# Patient Record
Sex: Male | Born: 1980 | Race: White | Hispanic: No | Smoking: Never smoker
Health system: Southern US, Community
[De-identification: ages and names within clinical notes are randomized; demographics above are authoritative.]

## PROBLEM LIST (undated history)

## (undated) ENCOUNTER — Emergency Department (HOSPITAL_COMMUNITY): Admission: EM | Payer: 59

## (undated) DIAGNOSIS — G894 Chronic pain syndrome: Secondary | ICD-10-CM

## (undated) DIAGNOSIS — F32A Depression, unspecified: Secondary | ICD-10-CM

## (undated) DIAGNOSIS — G4700 Insomnia, unspecified: Secondary | ICD-10-CM

## (undated) DIAGNOSIS — R0600 Dyspnea, unspecified: Secondary | ICD-10-CM

## (undated) DIAGNOSIS — N200 Calculus of kidney: Secondary | ICD-10-CM

## (undated) DIAGNOSIS — Z87442 Personal history of urinary calculi: Secondary | ICD-10-CM

## (undated) DIAGNOSIS — E119 Type 2 diabetes mellitus without complications: Secondary | ICD-10-CM

## (undated) DIAGNOSIS — R569 Unspecified convulsions: Secondary | ICD-10-CM

## (undated) DIAGNOSIS — I10 Essential (primary) hypertension: Secondary | ICD-10-CM

## (undated) DIAGNOSIS — F329 Major depressive disorder, single episode, unspecified: Secondary | ICD-10-CM

## (undated) DIAGNOSIS — K219 Gastro-esophageal reflux disease without esophagitis: Secondary | ICD-10-CM

## (undated) DIAGNOSIS — F419 Anxiety disorder, unspecified: Secondary | ICD-10-CM

## (undated) HISTORY — PX: LAMINECTOMY: SHX219

## (undated) HISTORY — PX: LITHOTRIPSY: SUR834

## (undated) HISTORY — DX: Insomnia, unspecified: G47.00

## (undated) HISTORY — DX: Unspecified convulsions: R56.9

## (undated) HISTORY — DX: Major depressive disorder, single episode, unspecified: F32.9

## (undated) HISTORY — PX: CHOLECYSTECTOMY, LAPAROSCOPIC: SHX56

## (undated) HISTORY — DX: Gastro-esophageal reflux disease without esophagitis: K21.9

## (undated) HISTORY — DX: Personal history of urinary calculi: Z87.442

## (undated) HISTORY — DX: Depression, unspecified: F32.A

---
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
## 2000-01-25 HISTORY — PX: WRIST GANGLION EXCISION: SUR520

## 2009-01-24 HISTORY — PX: APPENDECTOMY: SHX54

## 2010-03-25 ENCOUNTER — Emergency Department (HOSPITAL_COMMUNITY): Payer: Self-pay

## 2010-03-25 ENCOUNTER — Encounter (HOSPITAL_COMMUNITY): Payer: Self-pay | Admitting: Radiology

## 2010-03-25 ENCOUNTER — Emergency Department (HOSPITAL_COMMUNITY)
Admission: EM | Admit: 2010-03-25 | Discharge: 2010-03-25 | Disposition: A | Discharge status: Home / Self Care | Payer: Self-pay | Attending: Emergency Medicine | Admitting: Emergency Medicine

## 2010-03-25 DIAGNOSIS — N201 Calculus of ureter: Secondary | ICD-10-CM | POA: Insufficient documentation

## 2010-03-25 DIAGNOSIS — R110 Nausea: Secondary | ICD-10-CM | POA: Insufficient documentation

## 2010-03-25 DIAGNOSIS — N200 Calculus of kidney: Secondary | ICD-10-CM | POA: Insufficient documentation

## 2010-03-25 DIAGNOSIS — N1330 Unspecified hydronephrosis: Secondary | ICD-10-CM | POA: Insufficient documentation

## 2010-03-25 DIAGNOSIS — R109 Unspecified abdominal pain: Secondary | ICD-10-CM | POA: Insufficient documentation

## 2010-03-25 LAB — URINALYSIS, ROUTINE W REFLEX MICROSCOPIC
Bilirubin Urine: NEGATIVE
Hgb urine dipstick: NEGATIVE
Ketones, ur: NEGATIVE mg/dL
Nitrite: NEGATIVE
Protein, ur: NEGATIVE mg/dL
Specific Gravity, Urine: 1.023 (ref 1.005–1.030)
Urine Glucose, Fasting: NEGATIVE mg/dL
Urobilinogen, UA: 0.2 mg/dL (ref 0.0–1.0)
pH: 6.5 (ref 5.0–8.0)

## 2010-04-15 ENCOUNTER — Ambulatory Visit (HOSPITAL_COMMUNITY)
Admission: RE | Admit: 2010-04-15 | Discharge: 2010-04-15 | Disposition: A | Discharge status: Home / Self Care | Payer: Self-pay | Source: Ambulatory Visit | Attending: Urology | Admitting: Urology

## 2010-04-15 ENCOUNTER — Ambulatory Visit (HOSPITAL_COMMUNITY): Admit: 2010-04-15 | Payer: Self-pay | Admitting: Urology

## 2010-04-15 ENCOUNTER — Ambulatory Visit (HOSPITAL_COMMUNITY): Payer: Self-pay

## 2010-04-15 DIAGNOSIS — I998 Other disorder of circulatory system: Secondary | ICD-10-CM | POA: Insufficient documentation

## 2010-04-15 DIAGNOSIS — N201 Calculus of ureter: Secondary | ICD-10-CM | POA: Insufficient documentation

## 2010-04-15 DIAGNOSIS — Z01818 Encounter for other preprocedural examination: Secondary | ICD-10-CM | POA: Insufficient documentation

## 2010-06-21 ENCOUNTER — Emergency Department (HOSPITAL_COMMUNITY)
Admission: EM | Admit: 2010-06-21 | Discharge: 2010-06-21 | Disposition: A | Discharge status: Home / Self Care | Payer: Self-pay | Attending: Emergency Medicine | Admitting: Emergency Medicine

## 2010-06-21 DIAGNOSIS — N200 Calculus of kidney: Secondary | ICD-10-CM | POA: Insufficient documentation

## 2010-06-21 DIAGNOSIS — R109 Unspecified abdominal pain: Secondary | ICD-10-CM | POA: Insufficient documentation

## 2010-06-22 ENCOUNTER — Other Ambulatory Visit (HOSPITAL_COMMUNITY): Payer: Self-pay | Admitting: Urology

## 2010-06-22 DIAGNOSIS — N200 Calculus of kidney: Secondary | ICD-10-CM

## 2010-06-22 DIAGNOSIS — Z87442 Personal history of urinary calculi: Secondary | ICD-10-CM

## 2010-06-23 ENCOUNTER — Ambulatory Visit (HOSPITAL_COMMUNITY)
Admission: RE | Admit: 2010-06-23 | Discharge: 2010-06-23 | Disposition: A | Discharge status: Home / Self Care | Payer: Self-pay | Source: Ambulatory Visit | Attending: Urology | Admitting: Urology

## 2010-06-23 ENCOUNTER — Other Ambulatory Visit (HOSPITAL_COMMUNITY): Payer: Self-pay

## 2010-06-23 DIAGNOSIS — Z87442 Personal history of urinary calculi: Secondary | ICD-10-CM

## 2010-06-23 DIAGNOSIS — N201 Calculus of ureter: Secondary | ICD-10-CM | POA: Insufficient documentation

## 2010-06-23 DIAGNOSIS — R109 Unspecified abdominal pain: Secondary | ICD-10-CM | POA: Insufficient documentation

## 2010-07-08 ENCOUNTER — Ambulatory Visit (HOSPITAL_COMMUNITY)
Admission: RE | Admit: 2010-07-08 | Discharge: 2010-07-08 | Disposition: A | Discharge status: Home / Self Care | Payer: Self-pay | Source: Ambulatory Visit | Attending: Urology | Admitting: Urology

## 2010-07-08 ENCOUNTER — Ambulatory Visit (HOSPITAL_COMMUNITY): Payer: Self-pay

## 2010-07-08 DIAGNOSIS — G40909 Epilepsy, unspecified, not intractable, without status epilepticus: Secondary | ICD-10-CM | POA: Insufficient documentation

## 2010-07-08 DIAGNOSIS — N200 Calculus of kidney: Secondary | ICD-10-CM | POA: Insufficient documentation

## 2011-06-30 ENCOUNTER — Other Ambulatory Visit (HOSPITAL_COMMUNITY): Payer: Self-pay

## 2011-06-30 ENCOUNTER — Encounter (HOSPITAL_COMMUNITY): Payer: Self-pay | Admitting: Emergency Medicine

## 2011-06-30 ENCOUNTER — Emergency Department (HOSPITAL_COMMUNITY): Payer: Self-pay

## 2011-06-30 ENCOUNTER — Emergency Department (HOSPITAL_COMMUNITY)
Admission: EM | Admit: 2011-06-30 | Discharge: 2011-06-30 | Disposition: A | Discharge status: Home / Self Care | Payer: Self-pay | Attending: Emergency Medicine | Admitting: Emergency Medicine

## 2011-06-30 DIAGNOSIS — R1030 Lower abdominal pain, unspecified: Secondary | ICD-10-CM

## 2011-06-30 DIAGNOSIS — R110 Nausea: Secondary | ICD-10-CM | POA: Insufficient documentation

## 2011-06-30 DIAGNOSIS — R109 Unspecified abdominal pain: Secondary | ICD-10-CM | POA: Insufficient documentation

## 2011-06-30 LAB — URINALYSIS, ROUTINE W REFLEX MICROSCOPIC
Bilirubin Urine: NEGATIVE
Glucose, UA: NEGATIVE mg/dL
Hgb urine dipstick: NEGATIVE
Ketones, ur: NEGATIVE mg/dL
Leukocytes, UA: NEGATIVE
Nitrite: NEGATIVE
Protein, ur: NEGATIVE mg/dL
Specific Gravity, Urine: 1.028 (ref 1.005–1.030)
Urobilinogen, UA: 1 mg/dL (ref 0.0–1.0)
pH: 7.5 (ref 5.0–8.0)

## 2011-06-30 LAB — POCT I-STAT, CHEM 8
BUN: 14 mg/dL (ref 6–23)
Calcium, Ion: 1.26 mmol/L (ref 1.12–1.32)
Chloride: 102 mEq/L (ref 96–112)
Creatinine, Ser: 1 mg/dL (ref 0.50–1.35)
Glucose, Bld: 93 mg/dL (ref 70–99)
HCT: 51 % (ref 39.0–52.0)
Hemoglobin: 17.3 g/dL — ABNORMAL HIGH (ref 13.0–17.0)
Potassium: 5.2 mEq/L — ABNORMAL HIGH (ref 3.5–5.1)
Sodium: 142 mEq/L (ref 135–145)
TCO2: 31 mmol/L (ref 0–100)

## 2011-06-30 LAB — POTASSIUM: Potassium: 4.3 mEq/L (ref 3.5–5.1)

## 2011-06-30 MED ORDER — MORPHINE SULFATE 4 MG/ML IJ SOLN
4.0000 mg | Freq: Once | INTRAMUSCULAR | Status: AC
  Administered 2011-06-30: 4 mg via INTRAVENOUS
  Filled 2011-06-30: qty 1

## 2011-06-30 MED ORDER — SODIUM CHLORIDE 0.9 % IV BOLUS (SEPSIS)
1000.0000 mL | Freq: Once | INTRAVENOUS | Status: AC
  Administered 2011-06-30: 1000 mL via INTRAVENOUS

## 2011-06-30 MED ORDER — ONDANSETRON HCL 4 MG/2ML IJ SOLN
4.0000 mg | Freq: Once | INTRAMUSCULAR | Status: AC
  Administered 2011-06-30: 4 mg via INTRAVENOUS
  Filled 2011-06-30: qty 2

## 2011-06-30 MED ORDER — HYDROCODONE-ACETAMINOPHEN 5-325 MG PO TABS: 2.0000 | ORAL_TABLET | ORAL | Status: AC | PRN

## 2011-06-30 MED ORDER — IBUPROFEN 600 MG PO TABS: 600.0000 mg | ORAL_TABLET | Freq: Four times a day (QID) | ORAL | Status: AC | PRN

## 2011-06-30 MED ORDER — KETOROLAC TROMETHAMINE 30 MG/ML IJ SOLN
30.0000 mg | Freq: Once | INTRAMUSCULAR | Status: AC
  Administered 2011-06-30: 30 mg via INTRAVENOUS
  Filled 2011-06-30: qty 1

## 2011-06-30 NOTE — Discharge Instructions (Signed)
Kidney Stones Kidney stones (ureteral lithiasis) are deposits that form inside your kidneys. The intense pain is caused by the stone moving through the urinary tract. When the stone moves, the ureter goes into spasm around the stone. The stone is usually passed in the urine.  CAUSES   A disorder that makes certain neck glands produce too much parathyroid hormone (primary hyperparathyroidism).   A buildup of uric acid crystals.   Narrowing (stricture) of the ureter.   A kidney obstruction present at birth (congenital obstruction).   Previous surgery on the kidney or ureters.   Numerous kidney infections.  SYMPTOMS   Feeling sick to your stomach (nauseous).   Throwing up (vomiting).   Blood in the urine (hematuria).   Pain that usually spreads (radiates) to the groin.   Frequency or urgency of urination.  DIAGNOSIS   Taking a history and physical exam.   Blood or urine tests.   Computerized X-ray scan (CT scan).   Occasionally, an examination of the inside of the urinary bladder (cystoscopy) is performed.  TREATMENT   Observation.   Increasing your fluid intake.   Surgery may be needed if you have severe pain or persistent obstruction.  The size, location, and chemical composition are all important variables that will determine the proper choice of action for you. Talk to your caregiver to better understand your situation so that you will minimize the risk of injury to yourself and your kidney.  HOME CARE INSTRUCTIONS   Drink enough water and fluids to keep your urine clear or pale yellow.   Strain all urine through the provided strainer. Keep all particulate matter and stones for your caregiver to see. The stone causing the pain may be as small as a grain of salt. It is very important to use the strainer each and every time you pass your urine. The collection of your stone will allow your caregiver to analyze it and verify that a stone has actually passed.   Only take  over-the-counter or prescription medicines for pain, discomfort, or fever as directed by your caregiver.   Make a follow-up appointment with your caregiver as directed.   Get follow-up X-rays if required. The absence of pain does not always mean that the stone has passed. It may have only stopped moving. If the urine remains completely obstructed, it can cause loss of kidney function or even complete destruction of the kidney. It is your responsibility to make sure X-rays and follow-ups are completed. Ultrasounds of the kidney can show blockages and the status of the kidney. Ultrasounds are not associated with any radiation and can be performed easily in a matter of minutes.  SEEK IMMEDIATE MEDICAL CARE IF:   Pain cannot be controlled with the prescribed medicine.   You have a fever.   The severity or intensity of pain increases over 18 hours and is not relieved by pain medicine.   You develop a new onset of abdominal pain.   You feel faint or pass out.  MAKE SURE YOU:   Understand these instructions.   Will watch your condition.   Will get help right away if you are not doing well or get worse.  Document Released: 01/10/2005 Document Revised: 12/30/2010 Document Reviewed: 05/08/2009 ExitCare Patient Information 2012 [employer]  RESOURCE GUIDE  Dental Problems  Patients with Medicaid: [facility]                     [facility] [address]                                           [address] Phone:  [phone]                                                   Phone:  [phone]  If unable to pay or uninsured, contact:  [facility] or [facility] to become qualified for the adult dental clinic.  Chronic Pain Problems Contact [facility]  [phone] Patients need to be referred by their primary care doctor.  Insufficient Money for Medicine Contact [employer]:  call "211" or [facility]  [phone].  No Primary Care Doctor Call [facility]  [phone] Other agencies that provide inexpensive medical care    [facility]  [phone]    [facility]  [phone]    [facility]  [phone]    [facility]  [phone]    [facility]  [phone]    [facility]  [phone]  [facility]  [phone] [facility]  [phone] [facility]   [phone] (emergency services [phone])  Abuse/Neglect [place] Child Abuse Hotline (336) 641-3795 [place] Child Abuse Hotline 800-378-5315 (After Hours)  Emergency Shelter [facility] (336) 271-5985  [facility] (336) 275-9566 [facility] (704) [phone]  MRSA Hotline #:   832-7006    [place] [facility]  [facility] [place]  [employer]                           [facility] [address] [place]                     [address]         [address]  [place]                                               [place]                              [place] Phone:  [phone]                                  Phone:  [phone]                   Phone:  [phone]  [facility] Phone:  [phone]  [place] Child Abuse Hotline (336) 342-1394 (336) 342-3537 (After Hours)  

## 2011-06-30 NOTE — ED Notes (Signed)
MD at bedside. 

## 2011-06-30 NOTE — ED Provider Notes (Signed)
History     CSN: [id number]  Arrival date & time 06/30/11  1046   First MD Initiated Contact with Patient 06/30/11 1058      Chief Complaint  Patient presents with  . Flank Pain    (Consider location/radiation/quality/duration/timing/severity/associated sxs/prior treatment) HPI  Ho :nephroithiasis pw bl flank pain.  States he has pain almost everyday but worse today.  Feels like his previous kidney stones  Worse on right and in groin area.  Rates 10/10 at this time  Nausea without vomiting.  Denies hematuria/dysuria/freq/urgency. Min right scrotum pain. Denies fever, chills. No const/diarrhea. Denies chronic back pain.  abd surgeries include appy, cholecystectomy.  Past Medical History  Diagnosis Date  . S/P appy   . S/P laparoscopic cholecystectomy   . History of lithotripsy     No family history on file.  History  Substance Use Topics  . Smoking status: Never Smoker   . Smokeless tobacco: Not on file  . Alcohol Use: No      Review of Systems  All other systems reviewed and are negative.   except as noted HPI  Allergies  Review of patient's allergies indicates no known allergies.  Home Medications   Current Outpatient Rx  Name Route Sig Dispense Refill  . HYDROCODONE-ACETAMINOPHEN 5-325 MG PO TABS Oral Take 1 tablet by mouth every 6 (six) hours as needed. For pain    . MELOXICAM 15 MG PO TABS Oral Take 15 mg by mouth daily.    Marland Kitchen HYDROCODONE-ACETAMINOPHEN 5-325 MG PO TABS Oral Take 2 tablets by mouth every 4 (four) hours as needed for pain. 10 tablet 0  . IBUPROFEN 600 MG PO TABS Oral Take 1 tablet (600 mg total) by mouth every 6 (six) hours as needed for pain. 30 tablet 0    BP 115/76  Pulse 54  Temp(Src) 97.9 F (36.6 C) (Oral)  Resp 14  SpO2 98%  Physical Exam  Nursing note and vitals reviewed. Constitutional: He is oriented to person, place, and time. He appears well-developed and well-nourished. No distress.       Appears to be in pain  HENT:    Head: Atraumatic.  Mouth/Throat: Oropharynx is clear and moist.  Eyes: Conjunctivae are normal. Pupils are equal, round, and reactive to light.  Neck: Neck supple.  Cardiovascular: Normal rate, regular rhythm, normal heart sounds and intact distal pulses.  Exam reveals no gallop and no friction rub.   No murmur heard. Pulmonary/Chest: Effort normal. No respiratory distress. He has no wheezes. He has no rales.  Abdominal: Soft. Bowel sounds are normal. There is no tenderness. There is no rebound and no guarding.       No flank ttp  Genitourinary: Penis normal. No penile tenderness.       Min rt scrotal ttp no overlying color change or edema  +cremasteric reflex  Musculoskeletal: Normal range of motion. He exhibits no edema and no tenderness.  Neurological: He is alert and oriented to person, place, and time.  Skin: Skin is warm and dry.  Psychiatric: He has a normal mood and affect.    ED Course  Procedures (including critical care time)  Labs Reviewed  URINALYSIS, ROUTINE W REFLEX MICROSCOPIC - Abnormal; Notable for the following:    APPearance CLOUDY (*)    All other components within normal limits  POCT I-STAT, CHEM 8 - Abnormal; Notable for the following:    Potassium 5.2 (*)    Hemoglobin 17.3 (*)    All other components within  normal limits  POTASSIUM  LAB REPORT - SCANNED   Ct Abdomen Pelvis Wo Contrast  06/30/2011  *RADIOLOGY REPORT*  Clinical Data:  Bilateral flank pain.  Nausea.  History of kidney stones; prior lithotripsy.  Appendectomy, cholecystectomy.  CT ABDOMEN AND PELVIS WITHOUT CONTRAST (CT UROGRAM)  Technique: Contiguous axial images of the abdomen and pelvis without oral or intravenous contrast were obtained.  Comparison: Plain film 03/14/2011 [facility]).  Most recent CT 06/23/2011.  Findings:  Exam is limited for evaluation of entities other than urinary tract calculi due to lack of oral or intravenous contrast.   Clear lung bases.  Normal heart size  without pericardial or pleural effusion.  Normal uninfused appearance of the liver, spleen, stomach, pancreas. Cholecystectomy without biliary ductal dilatation.  Normal adrenal glands.    The proximal left ureteric stone is no longer present.  There is a 7 mm left renal collecting systems stones.  Interpolar.  Punctate bilateral renal collecting system stones are also identified and most apparent on coronal reformats.  No hydronephrosis.  No hydroureter or ureteric calculi.  No retroperitoneal or retrocrural adenopathy.  Normal colon and terminal ileum.  Appendix surgically absent. Normal small bowel without abdominal ascites.    No pelvic adenopathy.    Normal urinary bladder and prostate.  No significant free fluid.  No acute osseous abnormality.  IMPRESSION:  Left greater than right renal collecting systems stones.  No hydronephrosis or ureteric stone.  Interval passage of the minimally obstructive proximal left ureteric stone since the prior.  Original Report Authenticated By: Consuello Bossier, M.D.   US Scrotum  06/30/2011  *RADIOLOGY REPORT*  Clinical Data: Testicular pain for 2 years.  Rule out torsion.  ULTRASOUND OF SCROTUM  Technique:  Complete ultrasound examination of the testicles, epididymis, and other scrotal structures was performed.  DOPPLER ULTRASOUND OF SCROTAL VESSELS  Technique:  Color and duplex Doppler ultrasound was utilized to evaluate blood flow to the testicles and scrotal contents.  Comparison: CT of earlier today.  No prior ultrasounds.  Findings: Right testicle 4.9 x 2.2 x 3.4 cm. Normal in morphology. Normal color and spectral Doppler.  Left testicle 4.8 x 2.4 x 3.9 cm. Normal in morphology.  Normal color and spectral Doppler.  The epididymi are within normal limits.  Small left-sided hydrocele.  A "scrotal pearl" is identified within the left hemi scrotum on image 53.  No varicocele.  IMPRESSION:  1.  No evidence of torsion or explanation for testicular pain. 2.  Small left  hydrocele.  Calcification within the left hemi scrotum is most consistent with a "scrotal pearl."  This is typically associated with remote torsion of a testicular appendage.  Original Report Authenticated By: Consuello Bossier, M.D.   [place] Art/ven Flow Abd Pelv Doppler  06/30/2011  *RADIOLOGY REPORT*  Clinical Data: Testicular pain for 2 years.  Rule out torsion.  ULTRASOUND OF SCROTUM  Technique:  Complete ultrasound examination of the testicles, epididymis, and other scrotal structures was performed.  DOPPLER ULTRASOUND OF SCROTAL VESSELS  Technique:  Color and duplex Doppler ultrasound was utilized to evaluate blood flow to the testicles and scrotal contents.  Comparison: CT of earlier today.  No prior ultrasounds.  Findings: Right testicle 4.9 x 2.2 x 3.4 cm. Normal in morphology. Normal color and spectral Doppler.  Left testicle 4.8 x 2.4 x 3.9 cm. Normal in morphology.  Normal color and spectral Doppler.  The epididymi are within normal limits.  Small left-sided hydrocele.  A "scrotal pearl" is  identified within the left hemi scrotum on image 53.  No varicocele.  IMPRESSION:  1.  No evidence of torsion or explanation for testicular pain. 2.  Small left hydrocele.  Calcification within the left hemi scrotum is most consistent with a "scrotal pearl."  This is typically associated with remote torsion of a testicular appendage.  Original Report Authenticated By: Consuello Bossier, M.D.     1. Flank pain   2. Groin pain       MDM  Flank/groin pain without apparent acute cause. Ct ap as above . US scrotum without torsion. Pain controlled in ed. No EMC precluding discharge at this time. Given Precautions for return. PMD and urology f/u.        Forbes Cellar, MD 07/02/11 [phone]

## 2011-06-30 NOTE — ED Notes (Signed)
Pt c/o of bilateral groin pain, lower back pain, and nausea and states that it has gotten worse over the past couple of days. States that he has a history of reoccurring kidney stones. States that he has undergone 4 lithotripsy.

## 2011-10-13 ENCOUNTER — Emergency Department (HOSPITAL_COMMUNITY): Payer: Self-pay

## 2011-10-13 ENCOUNTER — Encounter (HOSPITAL_COMMUNITY): Payer: Self-pay | Admitting: Emergency Medicine

## 2011-10-13 ENCOUNTER — Emergency Department (HOSPITAL_COMMUNITY)
Admission: EM | Admit: 2011-10-13 | Discharge: 2011-10-13 | Disposition: A | Discharge status: Home / Self Care | Payer: Self-pay | Attending: Emergency Medicine | Admitting: Emergency Medicine

## 2011-10-13 DIAGNOSIS — N200 Calculus of kidney: Secondary | ICD-10-CM | POA: Insufficient documentation

## 2011-10-13 LAB — COMPREHENSIVE METABOLIC PANEL
ALT: 28 U/L (ref 0–53)
AST: 32 U/L (ref 0–37)
Albumin: 4.5 g/dL (ref 3.5–5.2)
Alkaline Phosphatase: 67 U/L (ref 39–117)
BUN: 15 mg/dL (ref 6–23)
CO2: 26 mEq/L (ref 19–32)
Calcium: 9.9 mg/dL (ref 8.4–10.5)
Chloride: 100 mEq/L (ref 96–112)
Creatinine, Ser: 0.73 mg/dL (ref 0.50–1.35)
GFR calc Af Amer: >90 mL/min (ref >90)
GFR calc non Af Amer: >90 mL/min (ref >90)
Glucose, Bld: 83 mg/dL (ref 70–99)
Potassium: 4.2 mEq/L (ref 3.5–5.1)
Sodium: 138 mEq/L (ref 135–145)
Total Bilirubin: 0.9 mg/dL (ref 0.3–1.2)
Total Protein: 7.7 g/dL (ref 6.0–8.3)

## 2011-10-13 LAB — URINALYSIS, ROUTINE W REFLEX MICROSCOPIC
Bilirubin Urine: NEGATIVE
Glucose, UA: NEGATIVE mg/dL
Hgb urine dipstick: NEGATIVE
Ketones, ur: NEGATIVE mg/dL
Leukocytes, UA: NEGATIVE
Nitrite: NEGATIVE
Protein, ur: NEGATIVE mg/dL
Specific Gravity, Urine: 1.02 (ref 1.005–1.030)
Urobilinogen, UA: 0.2 mg/dL (ref 0.0–1.0)
pH: 7 (ref 5.0–8.0)

## 2011-10-13 LAB — CBC WITH DIFFERENTIAL/PLATELET
Basophils Absolute: 0 10*3/uL (ref 0.0–0.1)
Basophils Relative: 0 % (ref 0–1)
Eosinophils Absolute: 0.2 10*3/uL (ref 0.0–0.7)
Eosinophils Relative: 2 % (ref 0–5)
HCT: 47.8 % (ref 39.0–52.0)
Hemoglobin: 17.3 g/dL — ABNORMAL HIGH (ref 13.0–17.0)
Lymphocytes Relative: 28 % (ref 12–46)
Lymphs Abs: 2.3 10*3/uL (ref 0.7–4.0)
MCH: 30.7 pg (ref 26.0–34.0)
MCHC: 36.2 g/dL — ABNORMAL HIGH (ref 30.0–36.0)
MCV: 84.9 fL (ref 78.0–100.0)
Monocytes Absolute: 0.8 10*3/uL (ref 0.1–1.0)
Monocytes Relative: 10 % (ref 3–12)
Neutro Abs: 5 10*3/uL (ref 1.7–7.7)
Neutrophils Relative %: 60 % (ref 43–77)
Platelets: 334 10*3/uL (ref 150–400)
RBC: 5.63 MIL/uL (ref 4.22–5.81)
RDW: 13 % (ref 11.5–15.5)
WBC: 8.3 10*3/uL (ref 4.0–10.5)

## 2011-10-13 LAB — LIPASE, BLOOD: Lipase: 34 U/L (ref 11–59)

## 2011-10-13 MED ORDER — KETOROLAC TROMETHAMINE 30 MG/ML IJ SOLN
30.0000 mg | Freq: Once | INTRAMUSCULAR | Status: AC
  Administered 2011-10-13: 30 mg via INTRAVENOUS
  Filled 2011-10-13: qty 1

## 2011-10-13 MED ORDER — HYDROCODONE-ACETAMINOPHEN 5-325 MG PO TABS: 1.0000 | ORAL_TABLET | Freq: Four times a day (QID) | ORAL | Status: DC | PRN

## 2011-10-13 MED ORDER — SODIUM CHLORIDE 0.9 % IV BOLUS (SEPSIS)
1000.0000 mL | Freq: Once | INTRAVENOUS | Status: AC
  Administered 2011-10-13: 1000 mL via INTRAVENOUS

## 2011-10-13 MED ORDER — HYDROMORPHONE HCL PF 1 MG/ML IJ SOLN
1.0000 mg | Freq: Once | INTRAMUSCULAR | Status: AC
  Administered 2011-10-13: 1 mg via INTRAVENOUS
  Filled 2011-10-13: qty 1

## 2011-10-13 NOTE — ED Notes (Signed)
Has hx of kidney stones, pain started yesterday getting worse. Has had lithotripsy 4 times.

## 2011-10-13 NOTE — ED Provider Notes (Signed)
History     CSN: [id number]  Arrival date & time 10/13/11  1139   First MD Initiated Contact with Patient 10/13/11 1202      Chief Complaint  Patient presents with  . Nephrolithiasis    (Consider location/radiation/quality/duration/timing/severity/associated sxs/prior treatment) HPI The patient presents with worsening of his bilateral flank pain.  He notes a long history of pain, with multiple prior kidney stones, including those requiring lithotripsy at least 4 prior occasions.  He notes over the past days he has had increasing pain in his bilateral flanks and left sides testicles.  He associates testicular pain with kidney stone presence. He notes nausea, anorexia, no vomiting or diarrhea.  No dysuria or hematuria. Some relief with Norco.  No clear exacerbating factors. Past Medical History  Diagnosis Date  . S/P appy   . S/P laparoscopic cholecystectomy   . History of lithotripsy     History reviewed. No pertinent past surgical history.  History reviewed. No pertinent family history.  History  Substance Use Topics  . Smoking status: Never Smoker   . Smokeless tobacco: Not on file  . Alcohol Use: No      Review of Systems  Constitutional:       Per HPI, otherwise negative  HENT:       Per HPI, otherwise negative  Eyes: Negative.   Respiratory:       Per HPI, otherwise negative  Cardiovascular:       Per HPI, otherwise negative  Gastrointestinal: Negative for vomiting.  Genitourinary: Positive for flank pain, scrotal swelling and testicular pain. Negative for dysuria, urgency, frequency, decreased urine volume, difficulty urinating and genital sores.  Musculoskeletal:       Per HPI, otherwise negative  Skin: Negative.   Neurological: Negative for syncope.    Allergies  Review of patient's allergies indicates no known allergies.  Home Medications   Current Outpatient Rx  Name Route Sig Dispense Refill  . CITALOPRAM HYDROBROMIDE 10 MG PO TABS Oral Take  10 mg by mouth daily.    Marland Kitchen HYDROCODONE-ACETAMINOPHEN 5-325 MG PO TABS Oral Take 1 tablet by mouth every 6 (six) hours as needed. For pain    . TRAMADOL HCL 50 MG PO TABS Oral Take 50 mg by mouth every 6 (six) hours as needed. pain      BP 116/94  Pulse 81  Temp 98 F (36.7 C) (Oral)  Resp 18  SpO2 95%  Physical Exam  Nursing note and vitals reviewed. Constitutional: He is oriented to person, place, and time. He appears well-developed. No distress.  HENT:  Head: Normocephalic and atraumatic.  Eyes: Conjunctivae normal and EOM are normal.  Cardiovascular: Normal rate and regular rhythm.   Pulmonary/Chest: Effort normal. No stridor. No respiratory distress.  Abdominal: He exhibits no distension.  Genitourinary:    Left testis shows tenderness. Left testis shows no mass and no swelling. Left testis is descended. Circumcised.  Musculoskeletal: He exhibits no edema.  Neurological: He is alert and oriented to person, place, and time.  Skin: Skin is warm and dry.  Psychiatric: He has a normal mood and affect.    ED Course  Procedures (including critical care time)  Labs Reviewed  CBC WITH DIFFERENTIAL - Abnormal; Notable for the following:    Hemoglobin 17.3 (*)     MCHC 36.2 (*)     All other components within normal limits  COMPREHENSIVE METABOLIC PANEL  LIPASE, BLOOD  URINALYSIS, ROUTINE W REFLEX MICROSCOPIC   No results found.  No diagnosis found.   Pulse ox 100% room air normal MDM  The patient presents with concerns of ongoing pain.  Notably, the patient has history of multiple prior kidney stones, and this is a consideration, though this is likely acute on chronic pain given the absence of distress, and unremarkable vital signs.  The patient's labs are reviewed room with no leukocytosis, no ill effects.  An x-ray demonstrates a stone, and the kidney, but no visible obstruction.  The patient had analgesic use and was discharged in stable condition.  Gerhard Munch, MD 10/13/11 1422

## 2011-11-06 ENCOUNTER — Emergency Department (HOSPITAL_COMMUNITY): Payer: Self-pay

## 2011-11-06 ENCOUNTER — Encounter (HOSPITAL_COMMUNITY): Payer: Self-pay | Admitting: *Deleted

## 2011-11-06 ENCOUNTER — Emergency Department (HOSPITAL_COMMUNITY)
Admission: EM | Admit: 2011-11-06 | Discharge: 2011-11-06 | Disposition: A | Discharge status: Home / Self Care | Payer: Self-pay | Attending: Emergency Medicine | Admitting: Emergency Medicine

## 2011-11-06 DIAGNOSIS — R109 Unspecified abdominal pain: Secondary | ICD-10-CM | POA: Insufficient documentation

## 2011-11-06 LAB — CBC WITH DIFFERENTIAL/PLATELET
Basophils Absolute: 0 10*3/uL (ref 0.0–0.1)
Basophils Relative: 0 % (ref 0–1)
Eosinophils Absolute: 0.1 10*3/uL (ref 0.0–0.7)
Eosinophils Relative: 2 % (ref 0–5)
HCT: 43.5 % (ref 39.0–52.0)
Hemoglobin: 15.9 g/dL (ref 13.0–17.0)
Lymphocytes Relative: 46 % (ref 12–46)
Lymphs Abs: 2.7 10*3/uL (ref 0.7–4.0)
MCH: 31.2 pg (ref 26.0–34.0)
MCHC: 36.6 g/dL — ABNORMAL HIGH (ref 30.0–36.0)
MCV: 85.5 fL (ref 78.0–100.0)
Monocytes Absolute: 0.6 10*3/uL (ref 0.1–1.0)
Monocytes Relative: 11 % (ref 3–12)
Neutro Abs: 2.4 10*3/uL (ref 1.7–7.7)
Neutrophils Relative %: 41 % — ABNORMAL LOW (ref 43–77)
Platelets: 305 10*3/uL (ref 150–400)
RBC: 5.09 MIL/uL (ref 4.22–5.81)
RDW: 13.1 % (ref 11.5–15.5)
WBC: 5.8 10*3/uL (ref 4.0–10.5)

## 2011-11-06 LAB — URINALYSIS, ROUTINE W REFLEX MICROSCOPIC
Bilirubin Urine: NEGATIVE
Glucose, UA: NEGATIVE mg/dL
Ketones, ur: NEGATIVE mg/dL
Leukocytes, UA: NEGATIVE
Nitrite: NEGATIVE
Protein, ur: NEGATIVE mg/dL
Specific Gravity, Urine: 1.023 (ref 1.005–1.030)
Urobilinogen, UA: 0.2 mg/dL (ref 0.0–1.0)
pH: 5 (ref 5.0–8.0)

## 2011-11-06 LAB — URINE MICROSCOPIC-ADD ON

## 2011-11-06 LAB — COMPREHENSIVE METABOLIC PANEL
ALT: 20 U/L (ref 0–53)
AST: 18 U/L (ref 0–37)
Albumin: 4.1 g/dL (ref 3.5–5.2)
Alkaline Phosphatase: 60 U/L (ref 39–117)
BUN: 13 mg/dL (ref 6–23)
CO2: 24 mEq/L (ref 19–32)
Calcium: 9.4 mg/dL (ref 8.4–10.5)
Chloride: 102 mEq/L (ref 96–112)
Creatinine, Ser: 0.77 mg/dL (ref 0.50–1.35)
GFR calc Af Amer: >90 mL/min (ref >90)
GFR calc non Af Amer: >90 mL/min (ref >90)
Glucose, Bld: 85 mg/dL (ref 70–99)
Potassium: 3.9 mEq/L (ref 3.5–5.1)
Sodium: 136 mEq/L (ref 135–145)
Total Bilirubin: 0.4 mg/dL (ref 0.3–1.2)
Total Protein: 6.9 g/dL (ref 6.0–8.3)

## 2011-11-06 MED ORDER — KETOROLAC TROMETHAMINE 30 MG/ML IJ SOLN
30.0000 mg | Freq: Once | INTRAMUSCULAR | Status: AC
  Administered 2011-11-06: 30 mg via INTRAVENOUS
  Filled 2011-11-06: qty 1

## 2011-11-06 MED ORDER — HYDROMORPHONE HCL PF 1 MG/ML IJ SOLN
1.0000 mg | Freq: Once | INTRAMUSCULAR | Status: AC
  Administered 2011-11-06: 1 mg via INTRAVENOUS
  Filled 2011-11-06: qty 1

## 2011-11-06 MED ORDER — ONDANSETRON HCL 4 MG/2ML IJ SOLN
4.0000 mg | Freq: Once | INTRAMUSCULAR | Status: AC
  Administered 2011-11-06: 4 mg via INTRAVENOUS
  Filled 2011-11-06: qty 2

## 2011-11-06 MED ORDER — SODIUM CHLORIDE 0.9 % IV BOLUS (SEPSIS)
1000.0000 mL | Freq: Once | INTRAVENOUS | Status: AC
  Administered 2011-11-06: 1000 mL via INTRAVENOUS

## 2011-11-06 MED ORDER — OXYCODONE-ACETAMINOPHEN 5-325 MG PO TABS: 1.0000 | ORAL_TABLET | ORAL | Status: DC | PRN

## 2011-11-06 MED ORDER — TAMSULOSIN HCL 0.4 MG PO CAPS: 0.4000 mg | ORAL_CAPSULE | Freq: Every day | ORAL | Status: DC

## 2011-11-06 NOTE — ED Notes (Signed)
History of kidney stones,  Pain started in left flank area last night,  Pt  Is rocking back and forth unable to sit or stand still  Has nausea but no vomiting

## 2011-11-06 NOTE — ED Notes (Signed)
20g. jelco removed cath intact site without redness, or swelling. 2 x 2 applied. Patient tolerated well.

## 2011-11-06 NOTE — ED Provider Notes (Signed)
History     CSN: [id number]  Arrival date & time 11/06/11  4098   First MD Initiated Contact with Patient 11/06/11 0416      Chief Complaint  Patient presents with  . Flank Pain    HPI The patient presents with concerns of ongoing left flank pain.  He is a long history of kidney stones, and has been evaluated here multiple times, with multiple prior radiographic studies.  He notes that this episode began approximately half a day ago.  Since onset the pain has been increasing, is focally about the left flank with radiation towards the left inguinal crease.  The pain is sharp, not relieved with OTC medication or narcotics.  There is mild associated nausea, no vomiting or diarrhea.  No dysuria, hematuria. Fevers, no chills.  Past Medical History  Diagnosis Date  . S/P appy   . S/P laparoscopic cholecystectomy   . History of lithotripsy     History reviewed. No pertinent past surgical history.  History reviewed. No pertinent family history.  History  Substance Use Topics  . Smoking status: Never Smoker   . Smokeless tobacco: Not on file  . Alcohol Use: No      Review of Systems  Constitutional:       Per HPI, otherwise negative  HENT:       Per HPI, otherwise negative  Eyes: Negative.   Respiratory:       Per HPI, otherwise negative  Cardiovascular:       Per HPI, otherwise negative  Gastrointestinal: Negative for vomiting.  Genitourinary: Negative for scrotal swelling and testicular pain.  Musculoskeletal:       Per HPI, otherwise negative  Skin: Negative.   Neurological: Negative for syncope.    Allergies  Review of patient's allergies indicates no known allergies.  Home Medications   Current Outpatient Rx  Name Route Sig Dispense Refill  . CITALOPRAM HYDROBROMIDE 10 MG PO TABS Oral Take 10 mg by mouth daily.    Marland Kitchen HYDROCODONE-ACETAMINOPHEN 5-325 MG PO TABS Oral Take 1 tablet by mouth every 6 (six) hours as needed. For pain 12 tablet 0  . TRAMADOL HCL 50  MG PO TABS Oral Take 50 mg by mouth every 6 (six) hours as needed. pain      BP 125/83  Pulse 75  Temp 97.9 F (36.6 C) (Oral)  Resp 22  SpO2 99%  Physical Exam  Nursing note and vitals reviewed. Constitutional: He is oriented to person, place, and time. He appears well-developed. No distress.  HENT:  Head: Normocephalic and atraumatic.  Eyes: Conjunctivae normal and EOM are normal.  Cardiovascular: Normal rate and regular rhythm.   Pulmonary/Chest: Effort normal. No stridor. No respiratory distress.  Abdominal: He exhibits no distension.  Genitourinary: Left testis shows tenderness. Left testis shows no mass and no swelling. Left testis is descended. Circumcised.  Musculoskeletal: He exhibits no edema.  Neurological: He is alert and oriented to person, place, and time.  Skin: Skin is warm and dry.  Psychiatric: He has a normal mood and affect.    ED Course  Procedures (including critical care time)  Labs Reviewed  CBC WITH DIFFERENTIAL - Abnormal; Notable for the following:    MCHC 36.6 (*)     Neutrophils Relative 41 (*)     All other components within normal limits  COMPREHENSIVE METABOLIC PANEL  URINALYSIS, ROUTINE W REFLEX MICROSCOPIC   Dg Abd 1 View  11/06/2011  *RADIOLOGY REPORT*  Clinical Data: Abdominal pain, left  kidney stone  ABDOMEN - 1 VIEW  Comparison: Abdominal radiographs dated 10/03/2011.  CT abdomen pelvis dated 06/30/2011.  Findings: Irregular 9 mm calcification overlying the left upper kidney, unchanged from prior CT.  No definite ureteral calculi are seen.  Calcified right pelvic phlebolith.  Cholecystectomy clips.  Visualized osseous structures are within normal limits.  IMPRESSION: Irregular 9 mm calcification overlying the left upper kidney, unchanged from prior CT.   Original Report Authenticated By: Charline Bills, M.D.      No diagnosis found.   On repeat evaluation the patient seems comfortable, texting, sitting upright in bed. MDM  This  male, who I evaluated several weeks ago for similar complaint presents with concerns of ongoing left flank pain.  On exam he is speaking clearly, in no distress, though he describes significant discomfort.  The patient's labs are reassuring for the low suspicion of acute ongoing infection.  An x-ray again demonstrates a left kidney stone.  The patient had significant improvement in his pain with analgesics, fluids.  We discussed the need for urology, as well as primary care followup.  Absent distress, unremarkable vital signs, reassuring labs, the patient was discharged in stable condition.  Gerhard Munch, MD 11/06/11 [phone]

## 2011-11-06 NOTE — ED Notes (Signed)
Bed:WA09<BR> Expected date:<BR> Expected time:<BR> Means of arrival:<BR> Comments:<BR> EMS

## 2011-11-27 ENCOUNTER — Emergency Department (HOSPITAL_COMMUNITY): Payer: Self-pay

## 2011-11-27 ENCOUNTER — Encounter (HOSPITAL_COMMUNITY): Payer: Self-pay | Admitting: Emergency Medicine

## 2011-11-27 ENCOUNTER — Emergency Department (HOSPITAL_COMMUNITY)
Admission: EM | Admit: 2011-11-27 | Discharge: 2011-11-27 | Disposition: A | Discharge status: Home / Self Care | Payer: Self-pay | Attending: Emergency Medicine | Admitting: Emergency Medicine

## 2011-11-27 DIAGNOSIS — Z9889 Other specified postprocedural states: Secondary | ICD-10-CM | POA: Insufficient documentation

## 2011-11-27 DIAGNOSIS — N200 Calculus of kidney: Secondary | ICD-10-CM | POA: Insufficient documentation

## 2011-11-27 LAB — URINALYSIS, ROUTINE W REFLEX MICROSCOPIC
Bilirubin Urine: NEGATIVE
Glucose, UA: NEGATIVE mg/dL
Hgb urine dipstick: NEGATIVE
Ketones, ur: NEGATIVE mg/dL
Leukocytes, UA: NEGATIVE
Nitrite: NEGATIVE
Protein, ur: NEGATIVE mg/dL
Specific Gravity, Urine: 1.02 (ref 1.005–1.030)
Urobilinogen, UA: 0.2 mg/dL (ref 0.0–1.0)
pH: 6.5 (ref 5.0–8.0)

## 2011-11-27 LAB — BASIC METABOLIC PANEL
BUN: 9 mg/dL (ref 6–23)
CO2: 26 mEq/L (ref 19–32)
Calcium: 9.8 mg/dL (ref 8.4–10.5)
Chloride: 100 mEq/L (ref 96–112)
Creatinine, Ser: 0.79 mg/dL (ref 0.50–1.35)
GFR calc Af Amer: >90 mL/min (ref >90)
GFR calc non Af Amer: >90 mL/min (ref >90)
Glucose, Bld: 89 mg/dL (ref 70–99)
Potassium: 4 mEq/L (ref 3.5–5.1)
Sodium: 136 mEq/L (ref 135–145)

## 2011-11-27 MED ORDER — FENTANYL CITRATE 0.05 MG/ML IJ SOLN: 50.0000 ug | Freq: Once | INTRAMUSCULAR | Status: DC

## 2011-11-27 MED ORDER — TAMSULOSIN HCL 0.4 MG PO CAPS: 0.4000 mg | ORAL_CAPSULE | Freq: Every day | ORAL | Status: DC

## 2011-11-27 MED ORDER — ONDANSETRON HCL 4 MG/2ML IJ SOLN
4.0000 mg | Freq: Once | INTRAMUSCULAR | Status: AC
  Administered 2011-11-27: 4 mg via INTRAVENOUS
  Filled 2011-11-27: qty 2

## 2011-11-27 MED ORDER — HYDROCODONE-ACETAMINOPHEN 5-325 MG PO TABS: 2.0000 | ORAL_TABLET | ORAL | Status: DC | PRN

## 2011-11-27 MED ORDER — SODIUM CHLORIDE 0.9 % IV BOLUS (SEPSIS)
1000.0000 mL | Freq: Once | INTRAVENOUS | Status: AC
  Administered 2011-11-27: 1000 mL via INTRAVENOUS

## 2011-11-27 MED ORDER — KETOROLAC TROMETHAMINE 30 MG/ML IJ SOLN
30.0000 mg | Freq: Once | INTRAMUSCULAR | Status: AC
  Administered 2011-11-27: 30 mg via INTRAVENOUS
  Filled 2011-11-27: qty 1

## 2011-11-27 MED ORDER — HYDROMORPHONE HCL PF 1 MG/ML IJ SOLN
1.0000 mg | Freq: Once | INTRAMUSCULAR | Status: AC
  Administered 2011-11-27: 1 mg via INTRAVENOUS
  Filled 2011-11-27: qty 1

## 2011-11-27 NOTE — ED Notes (Signed)
RN to obtain labs with start of IV 

## 2011-11-27 NOTE — ED Notes (Signed)
Pt presents w/ left flank pain and bilateral groin pain, pt states 3rd visit in one month known kidney stone on left, but pt states did pass "something" last week that was pretty gross. Nausea today w/o emesis. Urine appears cloudy, but not bloody

## 2011-11-27 NOTE — ED Notes (Signed)
Pt has not done any f/u care as outpatient d/t lack of insurance. States he is out of pain medications d/t he has to take them so often they are not working as good

## 2011-11-27 NOTE — ED Provider Notes (Signed)
History     CSN: [id number]  Arrival date & time 11/27/11  1203   First MD Initiated Contact with Patient 11/27/11 1245      Chief Complaint  Patient presents with  . Flank Pain    (Consider location/radiation/quality/duration/timing/severity/associated sxs/prior treatment) HPI Comments: Pt states that he has history of multiple stones over the last couple of years:pt state that he did pass something in the last week, but he doesn't feel like it is gone:pt states that he can't see urology because he owes them money and that is why this is his third visit in the last month  Patient is a 31 y.o. male presenting with flank pain. The history is provided by the patient. No language interpreter was used.  Flank Pain This is a recurrent problem. The current episode started 1 to 4 weeks ago. The problem occurs intermittently. The problem has been unchanged. Pertinent negatives include no abdominal pain, fever or weakness. Nothing aggravates the symptoms.    Past Medical History  Diagnosis Date  . S/P appy   . S/P laparoscopic cholecystectomy   . History of lithotripsy   . Renal disorder     Past Surgical History  Procedure Date  . Lithotripsy     four times over the years    No family history on file.  History  Substance Use Topics  . Smoking status: Never Smoker   . Smokeless tobacco: Never Used  . Alcohol Use: No      Review of Systems  Constitutional: Negative for fever.  Respiratory: Negative.   Cardiovascular: Negative.   Gastrointestinal: Negative for abdominal pain.  Genitourinary: Positive for flank pain.  Neurological: Negative for weakness.    Allergies  Review of patient's allergies indicates no known allergies.  Home Medications   Current Outpatient Rx  Name  Route  Sig  Dispense  Refill  . CITALOPRAM HYDROBROMIDE 10 MG PO TABS   Oral   Take 10 mg by mouth daily.         Marland Kitchen HYDROCODONE-ACETAMINOPHEN 5-500 MG PO CAPS   Oral   Take 1 capsule by  mouth every 6 (six) hours as needed. For pain         . IBUPROFEN 200 MG PO TABS   Oral   Take 800 mg by mouth every 6 (six) hours as needed.           BP 158/95  Pulse 69  Temp 98.1 F (36.7 C) (Oral)  Resp 16  SpO2 100%  Physical Exam  Constitutional: He appears well-developed and well-nourished.  HENT:  Head: Normocephalic and atraumatic.  Eyes: Conjunctivae normal and EOM are normal.  Pulmonary/Chest: Effort normal and breath sounds normal.  Abdominal: Soft. Bowel sounds are normal. There is no tenderness.  Musculoskeletal: Normal range of motion.  Neurological: He is alert.  Skin: Skin is warm and dry.  Psychiatric: He has a normal mood and affect.    ED Course  Procedures (including critical care time)   Labs Reviewed  URINALYSIS, ROUTINE W REFLEX MICROSCOPIC  BASIC METABOLIC PANEL   Dg Abd 1 View  11/27/2011  *RADIOLOGY REPORT*  Clinical Data: Left flank pain  ABDOMEN - 1 VIEW  Comparison: 11/06/2011  Findings: Stable left nephrolithiasis in the upper pole region measuring 11 mm.  Nonobstructive bowel gas pattern. Cholecystectomy clips noted.  Right hemi pelvis calcifications compatible with venous phleboliths.  No osseous abnormality.  IMPRESSION: Stable left upper pole nephrolithiasis.   Original Report Authenticated By: Judie Petit.  Shick, M.D.      1. Kidney stone       MDM  No infection noted:pt comfortable at  This time        Teressa Lower, NP 11/27/11 1931

## 2011-11-28 NOTE — ED Provider Notes (Signed)
Medical screening examination/treatment/procedure(s) were performed by non-physician practitioner and as supervising physician I was immediately available for consultation/collaboration.   Shelda Jakes, MD 11/28/11 1120

## 2012-01-26 ENCOUNTER — Encounter (HOSPITAL_COMMUNITY): Payer: Self-pay | Admitting: Emergency Medicine

## 2012-01-26 ENCOUNTER — Emergency Department (HOSPITAL_COMMUNITY)
Admission: EM | Admit: 2012-01-26 | Discharge: 2012-01-26 | Disposition: A | Discharge status: Home / Self Care | Payer: BC Managed Care – PPO | Attending: Emergency Medicine | Admitting: Emergency Medicine

## 2012-01-26 ENCOUNTER — Emergency Department (HOSPITAL_COMMUNITY): Payer: BC Managed Care – PPO

## 2012-01-26 DIAGNOSIS — N23 Unspecified renal colic: Secondary | ICD-10-CM | POA: Insufficient documentation

## 2012-01-26 DIAGNOSIS — Z87448 Personal history of other diseases of urinary system: Secondary | ICD-10-CM | POA: Insufficient documentation

## 2012-01-26 DIAGNOSIS — N200 Calculus of kidney: Secondary | ICD-10-CM | POA: Insufficient documentation

## 2012-01-26 DIAGNOSIS — R109 Unspecified abdominal pain: Secondary | ICD-10-CM | POA: Insufficient documentation

## 2012-01-26 DIAGNOSIS — M549 Dorsalgia, unspecified: Secondary | ICD-10-CM | POA: Insufficient documentation

## 2012-01-26 DIAGNOSIS — R319 Hematuria, unspecified: Secondary | ICD-10-CM | POA: Insufficient documentation

## 2012-01-26 LAB — URINALYSIS, ROUTINE W REFLEX MICROSCOPIC
Bilirubin Urine: NEGATIVE
Glucose, UA: NEGATIVE mg/dL
Leukocytes, UA: NEGATIVE
Nitrite: NEGATIVE
Protein, ur: NEGATIVE mg/dL
Specific Gravity, Urine: 1.02 (ref 1.005–1.030)
Urobilinogen, UA: 0.2 mg/dL (ref 0.0–1.0)
pH: 7.5 (ref 5.0–8.0)

## 2012-01-26 LAB — BASIC METABOLIC PANEL
BUN: 13 mg/dL (ref 6–23)
CO2: 23 mEq/L (ref 19–32)
Calcium: 10.3 mg/dL (ref 8.4–10.5)
Chloride: 100 mEq/L (ref 96–112)
Creatinine, Ser: 0.72 mg/dL (ref 0.50–1.35)
GFR calc Af Amer: >90 mL/min (ref >90)
GFR calc non Af Amer: >90 mL/min (ref >90)
Glucose, Bld: 83 mg/dL (ref 70–99)
Potassium: 4.4 mEq/L (ref 3.5–5.1)
Sodium: 136 mEq/L (ref 135–145)

## 2012-01-26 LAB — CBC
HCT: 49.1 % (ref 39.0–52.0)
Hemoglobin: 18.1 g/dL — ABNORMAL HIGH (ref 13.0–17.0)
MCH: 31.3 pg (ref 26.0–34.0)
MCHC: 36.9 g/dL — ABNORMAL HIGH (ref 30.0–36.0)
MCV: 84.8 fL (ref 78.0–100.0)
Platelets: 348 10*3/uL (ref 150–400)
RBC: 5.79 MIL/uL (ref 4.22–5.81)
RDW: 13.2 % (ref 11.5–15.5)
WBC: 10.2 10*3/uL (ref 4.0–10.5)

## 2012-01-26 LAB — URINE MICROSCOPIC-ADD ON

## 2012-01-26 MED ORDER — IBUPROFEN 800 MG PO TABS: 800.0000 mg | ORAL_TABLET | Freq: Three times a day (TID) | ORAL | Status: DC

## 2012-01-26 MED ORDER — MORPHINE SULFATE 4 MG/ML IJ SOLN
4.0000 mg | Freq: Once | INTRAMUSCULAR | Status: AC
  Administered 2012-01-26: 4 mg via INTRAVENOUS
  Filled 2012-01-26: qty 1

## 2012-01-26 MED ORDER — OXYCODONE-ACETAMINOPHEN 5-325 MG PO TABS: 1.0000 | ORAL_TABLET | Freq: Four times a day (QID) | ORAL | Status: DC | PRN

## 2012-01-26 MED ORDER — ONDANSETRON HCL 4 MG/2ML IJ SOLN
4.0000 mg | Freq: Once | INTRAMUSCULAR | Status: AC
  Administered 2012-01-26: 4 mg via INTRAVENOUS
  Filled 2012-01-26: qty 2

## 2012-01-26 MED ORDER — KETOROLAC TROMETHAMINE 30 MG/ML IJ SOLN
30.0000 mg | Freq: Once | INTRAMUSCULAR | Status: AC
  Administered 2012-01-26: 30 mg via INTRAVENOUS
  Filled 2012-01-26: qty 1

## 2012-01-26 MED ORDER — TAMSULOSIN HCL 0.4 MG PO CAPS: 0.4000 mg | ORAL_CAPSULE | Freq: Every day | ORAL | Status: DC

## 2012-01-26 MED ORDER — PROMETHAZINE HCL 25 MG PO TABS: 25.0000 mg | ORAL_TABLET | Freq: Four times a day (QID) | ORAL | Status: DC | PRN

## 2012-01-26 MED ORDER — TAMSULOSIN HCL 0.4 MG PO CAPS
0.4000 mg | ORAL_CAPSULE | Freq: Once | ORAL | Status: AC
  Administered 2012-01-26: 0.4 mg via ORAL
  Filled 2012-01-26: qty 1

## 2012-01-26 MED ORDER — SODIUM CHLORIDE 0.9 % IV BOLUS (SEPSIS)
1000.0000 mL | Freq: Once | INTRAVENOUS | Status: AC
  Administered 2012-01-26: 1000 mL via INTRAVENOUS

## 2012-01-26 MED ORDER — HYDROMORPHONE HCL PF 1 MG/ML IJ SOLN
1.0000 mg | Freq: Once | INTRAMUSCULAR | Status: AC
  Administered 2012-01-26: 1 mg via INTRAVENOUS
  Filled 2012-01-26: qty 1

## 2012-01-26 MED ORDER — FENTANYL CITRATE 0.05 MG/ML IJ SOLN
50.0000 ug | Freq: Once | INTRAMUSCULAR | Status: AC
  Administered 2012-01-26: 50 ug via INTRAVENOUS
  Filled 2012-01-26: qty 2

## 2012-01-26 NOTE — ED Provider Notes (Signed)
History     CSN: [id number]  Arrival date & time 01/26/12  1200   First MD Initiated Contact with Patient 01/26/12 1248      Chief Complaint  Patient presents with  . Nephrolithiasis  . Nausea    (Consider location/radiation/quality/duration/timing/severity/associated sxs/prior treatment) HPI Comments: Casey Caldwell presents ambulatory for evaluation of left flank pain radiating down to his scrotum.  He has a history of kidney stones and has undergone surgical procedures to remove them previously.  He developed pain this morning and states he is now going to miss his appointment with his urologist.  He reports nausea but no fever.  He denies any other issues.  Patient is a 32 y.o. male presenting with flank pain. The history is provided by the patient. No language interpreter was used.  Flank Pain This is a recurrent problem. The current episode started 3 to 5 hours ago. The problem occurs constantly. Associated symptoms include abdominal pain. Pertinent negatives include no chest pain, no headaches and no shortness of breath. Nothing aggravates the symptoms. Nothing relieves the symptoms.    Past Medical History  Diagnosis Date  . S/P appy   . S/P laparoscopic cholecystectomy   . History of lithotripsy   . Renal disorder     Past Surgical History  Procedure Date  . Lithotripsy     four times over the years    No family history on file.  History  Substance Use Topics  . Smoking status: Never Smoker   . Smokeless tobacco: Never Used  . Alcohol Use: No      Review of Systems  Constitutional: Negative for fever.  Respiratory: Negative for shortness of breath.   Cardiovascular: Negative for chest pain.  Gastrointestinal: Positive for abdominal pain.  Genitourinary: Positive for hematuria and flank pain.  Musculoskeletal: Positive for back pain.  Neurological: Negative for headaches.  All other systems reviewed and are negative.    Allergies  Review of patient's  allergies indicates no known allergies.  Home Medications   Current Outpatient Rx  Name  Route  Sig  Dispense  Refill  . HYDROCODONE-ACETAMINOPHEN 5-325 MG PO TABS   Oral   Take 2 tablets by mouth every 4 (four) hours as needed for pain.   10 tablet   0   . IBUPROFEN 200 MG PO TABS   Oral   Take 800 mg by mouth every 6 (six) hours as needed.         Marland Kitchen OVER THE COUNTER MEDICATION      Casey Caldwell herbal kidney supplement         . TAMSULOSIN HCL 0.4 MG PO CAPS   Oral   Take 1 capsule (0.4 mg total) by mouth daily.   30 capsule   0   . VITAMIN C 500 MG PO TABS   Oral   Take 500 mg by mouth daily.           BP 160/104  Pulse 77  Temp 97.6 F (36.4 C) (Oral)  Resp 22  SpO2 96%  Physical Exam  Vitals reviewed. Constitutional: He is oriented to person, place, and time. He appears well-developed and well-nourished. No distress.  HENT:  Head: Normocephalic and atraumatic.  Right Ear: External ear normal.  Left Ear: External ear normal.  Nose: Nose normal.  Mouth/Throat: Oropharynx is clear and moist. No oropharyngeal exudate.  Eyes: Conjunctivae normal are normal. Pupils are equal, round, and reactive to light. Right eye exhibits no discharge. Left eye exhibits  no discharge. No scleral icterus.  Neck: Normal range of motion. Neck supple. No JVD present. No tracheal deviation present.  Pulmonary/Chest: Effort normal and breath sounds normal. No stridor. No respiratory distress. He has no wheezes. He has no rales. He exhibits no tenderness.  Abdominal: Soft. Bowel sounds are normal. He exhibits no distension and no mass. There is tenderness (vague left sided). There is no rebound and no guarding.  Musculoskeletal: Normal range of motion. He exhibits no edema.       + left CVAT  Lymphadenopathy:    He has no cervical adenopathy.  Neurological: He is alert and oriented to person, place, and time. No cranial nerve deficit.  Skin: Skin is warm and dry. No rash noted. He  is not diaphoretic. No erythema. No pallor.  Psychiatric: He has a normal mood and affect. His behavior is normal.    ED Course  Procedures (including critical care time)  Labs Reviewed  CBC - Abnormal; Notable for the following:    Hemoglobin 18.1 (*)     MCHC 36.9 (*)  PRE-WARMING TECHNIQUE USED   All other components within normal limits  URINALYSIS, ROUTINE W REFLEX MICROSCOPIC - Abnormal; Notable for the following:    Hgb urine dipstick LARGE (*)     Ketones, ur TRACE (*)     All other components within normal limits  URINE MICROSCOPIC-ADD ON - Abnormal; Notable for the following:    Bacteria, UA FEW (*)     All other components within normal limits  BASIC METABOLIC PANEL   US Renal  01/26/2012  *RADIOLOGY REPORT*  Clinical Data: Severe right flank pain.  History of nephrolithiasis.  RENAL/URINARY TRACT ULTRASOUND COMPLETE  Comparison:  Abdomen radiograph dated 11/27/2011.  Findings:  Right Kidney:  6 mm calculus in the midportion of the kidney. Otherwise, normal, measuring 12.4 cm in length.  No hydronephrosis.  Left Kidney:  14.5 mm calculus in the renal pelvis.  7 mm calculus in the lower pole and additional smaller calculus in the lower pole.  Otherwise, normal, measuring 12.9 cm in length.  No hydronephrosis.  Bladder:  Normal.  IMPRESSION: Bilateral nonobstructing renal calculi.   Original Report Authenticated By: Beckie Salts, M.D.      No diagnosis found.    MDM  Pt presents for evaluation of left flank pain with a known history of bilateral renal stones.  He missed an appointment this afternoon with his urologist secondary to acute pain today.  He appears nontoxic, NAD.  Will obtain basic labs, urinalysis, and renal ultrasound to assess for obstruction.  Will treat discomfort with IV toradol and morphine.  1535.  Pt stable, NAD.  He does have some hematuria without evidence of a UTI.  Pt appears comfortable.  The ultrasound demonstrates no evidence of obstruction.  Plan  discharge home with instructions to follow-up with his urologist.       Tobin Chad, MD 01/26/12 [phone]

## 2012-01-26 NOTE — Progress Notes (Signed)
Pt confirms he no longer sees Dr Isabel Caprice and but is scheduled soon to see a new neurologist Casey Caldwell, Casey Caldwell.

## 2012-01-26 NOTE — ED Notes (Signed)
States that he was diagnosed with multiple kidney stones 2 weeks ago with the largest stone being on the left side 8 mm and 2 smaller stones on the right. States that he has bilaterally back pain and testicle pain.

## 2012-01-26 NOTE — ED Notes (Signed)
Pt given a urinal.

## 2012-01-26 NOTE — ED Notes (Signed)
Renal US in process.

## 2012-02-03 ENCOUNTER — Emergency Department (HOSPITAL_COMMUNITY)
Admission: EM | Admit: 2012-02-03 | Discharge: 2012-02-04 | Disposition: A | Discharge status: Home / Self Care | Payer: BC Managed Care – PPO | Attending: Emergency Medicine | Admitting: Emergency Medicine

## 2012-02-03 ENCOUNTER — Encounter (HOSPITAL_COMMUNITY): Payer: Self-pay | Admitting: Emergency Medicine

## 2012-02-03 DIAGNOSIS — R109 Unspecified abdominal pain: Secondary | ICD-10-CM

## 2012-02-03 DIAGNOSIS — Z87442 Personal history of urinary calculi: Secondary | ICD-10-CM | POA: Insufficient documentation

## 2012-02-03 DIAGNOSIS — R112 Nausea with vomiting, unspecified: Secondary | ICD-10-CM | POA: Insufficient documentation

## 2012-02-03 MED ORDER — HYDROMORPHONE HCL PF 1 MG/ML IJ SOLN
1.0000 mg | Freq: Once | INTRAMUSCULAR | Status: AC
  Administered 2012-02-04: 1 mg via INTRAVENOUS
  Filled 2012-02-03: qty 1

## 2012-02-03 MED ORDER — KETOROLAC TROMETHAMINE 30 MG/ML IJ SOLN
30.0000 mg | Freq: Once | INTRAMUSCULAR | Status: AC
  Administered 2012-02-04: 30 mg via INTRAVENOUS
  Filled 2012-02-03: qty 1

## 2012-02-03 MED ORDER — SODIUM CHLORIDE 0.9 % IV BOLUS (SEPSIS)
1000.0000 mL | Freq: Once | INTRAVENOUS | Status: AC
  Administered 2012-02-04: 1000 mL via INTRAVENOUS

## 2012-02-03 MED ORDER — HYDROMORPHONE HCL PF 1 MG/ML IJ SOLN
1.0000 mg | INTRAMUSCULAR | Status: DC | PRN
  Administered 2012-02-04: 1 mg via INTRAVENOUS
  Filled 2012-02-03: qty 1

## 2012-02-03 MED ORDER — ONDANSETRON HCL 4 MG/2ML IJ SOLN
4.0000 mg | Freq: Once | INTRAMUSCULAR | Status: AC
  Administered 2012-02-04: 4 mg via INTRAVENOUS
  Filled 2012-02-03: qty 2

## 2012-02-03 NOTE — ED Provider Notes (Signed)
History     CSN: [id number]  Arrival date & time 02/03/12  2228   First MD Initiated Contact with Patient 02/03/12 2342      Chief Complaint  Patient presents with  . 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 Feels warm, no measured temp, has severe pain both flanks with N/V, no diarrhea. no bloody emesis. Pain sharp in quality and radiates to testicles, feels like a kidney stone, Past Medical History  Diagnosis Date  . S/P appy   . S/P laparoscopic cholecystectomy   . History of lithotripsy   . Renal disorder     Past Surgical History  Procedure Date  . Lithotripsy     four times over the years  . Appendectomy   . Cholecystectomy   . Tonsillectomy   . Meatotomy     No family history on file.  History  Substance Use Topics  . Smoking status: Never Smoker   . Smokeless tobacco: Never Used  . Alcohol Use: Yes     Comment: rarely      Review of Systems  Constitutional: Negative for fever and chills.  HENT: Negative for neck pain and neck stiffness.   Eyes: Negative for pain.  Respiratory: Negative for shortness of breath.   Cardiovascular: Negative for chest pain.  Gastrointestinal: Positive for nausea and vomiting. Negative for abdominal pain.  Genitourinary: Positive for flank pain. Negative for dysuria and hematuria.  Musculoskeletal: Negative for back pain.  Skin: Negative for rash.  Neurological: Negative for headaches.  All other systems reviewed and are negative.    Allergies  Review of patient's allergies indicates no known allergies.  Home Medications   Current Outpatient Rx  Name  Route  Sig  Dispense  Refill  . HYDROCODONE-ACETAMINOPHEN 5-325 MG PO TABS   Oral   Take 2 tablets by mouth every 4 (four) hours as needed for pain.   10 tablet   0   . OVER THE COUNTER  MEDICATION      Stefanie Libel herbal kidney supplement         . PROMETHAZINE HCL 25 MG PO TABS   Oral   Take 1 tablet (25 mg total) by mouth every 6 (six) hours as needed for nausea.   12 tablet   0   . TAMSULOSIN HCL 0.4 MG PO CAPS   Oral   Take 0.4 mg by mouth daily after breakfast.         . VITAMIN C 500 MG PO TABS   Oral   Take 1,000 mg by mouth 3 (three) times daily.          . IBUPROFEN 800 MG PO TABS   Oral   Take 1 tablet (800 mg total) by mouth 3 (three) times daily.   21 tablet   0     BP 149/78  Pulse 80  Temp 97.8 F (36.6 C) (Oral)  Resp 18  Ht 6' (1.829 m)  Wt 280 lb (127.007 kg)  BMI 37.97 kg/m2  SpO2 96%  Physical Exam  Constitutional: He is oriented to person, place, and time. He appears well-developed and well-nourished.  HENT:  Head: Normocephalic and atraumatic.  Eyes: EOM are normal. Pupils are equal, round, and reactive to light.  Neck: Neck supple.  Cardiovascular: Normal rate, regular rhythm and intact distal pulses.   Pulmonary/Chest: Effort normal. No respiratory distress. He  exhibits no tenderness.  Abdominal: Soft. Bowel sounds are normal. He exhibits no distension. There is no rebound and no guarding.       TTP L flank  Genitourinary: Penis normal.       No testicle tenderness  Musculoskeletal: Normal range of motion. He exhibits no edema.  Neurological: He is alert and oriented to person, place, and time.  Skin: Skin is warm and dry.    ED Course  Procedures (including critical care time)  Results for orders placed during the hospital encounter of 02/03/12  URINALYSIS, ROUTINE W REFLEX MICROSCOPIC      Component Value Range   Color, Urine YELLOW  YELLOW   APPearance CLEAR  CLEAR   Specific Gravity, Urine 1.024  1.005 - 1.030   pH 6.0  5.0 - 8.0   Glucose, UA NEGATIVE  NEGATIVE mg/dL   Hgb urine dipstick NEGATIVE  NEGATIVE   Bilirubin Urine NEGATIVE  NEGATIVE   Ketones, ur NEGATIVE  NEGATIVE mg/dL   Protein, ur NEGATIVE   NEGATIVE mg/dL   Urobilinogen, UA 0.2  0.0 - 1.0 mg/dL   Nitrite NEGATIVE  NEGATIVE   Leukocytes, UA NEGATIVE  NEGATIVE  POCT I-STAT, CHEM 8      Component Value Range   Sodium 141  135 - 145 mEq/L   Potassium 3.9  3.5 - 5.1 mEq/L   Chloride 106  96 - 112 mEq/L   BUN 13  6 - 23 mg/dL   Creatinine, Ser 7.82  0.50 - 1.35 mg/dL   Glucose, Bld 94  70 - 99 mg/dL   Calcium, Ion 9.56  2.13 - 1.23 mmol/L   TCO2 26  0 - 100 mmol/L   Hemoglobin 16.3  13.0 - 17.0 g/dL   HCT 08.6  57.8 - 46.9 %   Ct Abdomen Pelvis Wo Contrast  02/04/2012  *RADIOLOGY REPORT*  Clinical Data: Flank pain.  CT ABDOMEN AND PELVIS WITHOUT CONTRAST  Technique:  Multidetector CT imaging of the abdomen and pelvis was performed following the standard protocol without intravenous contrast.  Comparison: 06/30/2011.  Ultrasound 01/26/2012.  Findings: Lung bases are clear.  No effusions.  Heart is normal size.  Prior cholecystectomy.  Liver, spleen, pancreas, adrenals have an unremarkable unenhanced appearance.  There is a 9 mm stone within the left renal pelvis.  Multiple smaller bilateral renal calculi.  No ureteral stones or hydronephrosis.  Urinary bladder decompressed.  Calcified phleboliths in the anatomic pelvis.  Stomach, large and small bowel are unremarkable.  Appendix is visualized and is normal.  No free fluid, free air or adenopathy.  No acute bony abnormality.  IMPRESSION: Bilateral nephrolithiasis.  The largest is a 9 mm stone in the left renal pelvis.  No hydronephrosis or ureteral stones.   Original Report Authenticated By: Charlett Nose, M.D.    IVFs, IV dilaudid, IV zofran   MDM   Flank pain improved with IVFs and IV narcotics, evaluated with UA, labs and Ct scan as above. No UTI, normal crt, no ureterolithiasis appreciated. Condition improved. RX provided, has scheduled urology follow up.         Sunnie Nielsen, MD 02/04/12 [id number]

## 2012-02-03 NOTE — ED Notes (Signed)
Pt seen recently for kidney stones, has surgery scheduled next Wednesday for cysto with laser and stent placement. Pt states pain unbearable now has taken Vicodin 60mg  today. +nausea.

## 2012-02-04 ENCOUNTER — Emergency Department (HOSPITAL_COMMUNITY): Payer: BC Managed Care – PPO

## 2012-02-04 LAB — POCT I-STAT, CHEM 8
BUN: 13 mg/dL (ref 6–23)
Calcium, Ion: 1.22 mmol/L (ref 1.12–1.23)
Chloride: 106 mEq/L (ref 96–112)
Creatinine, Ser: 0.8 mg/dL (ref 0.50–1.35)
Glucose, Bld: 94 mg/dL (ref 70–99)
HCT: 48 % (ref 39.0–52.0)
Hemoglobin: 16.3 g/dL (ref 13.0–17.0)
Potassium: 3.9 mEq/L (ref 3.5–5.1)
Sodium: 141 mEq/L (ref 135–145)
TCO2: 26 mmol/L (ref 0–100)

## 2012-02-04 LAB — URINALYSIS, ROUTINE W REFLEX MICROSCOPIC
Bilirubin Urine: NEGATIVE
Glucose, UA: NEGATIVE mg/dL
Hgb urine dipstick: NEGATIVE
Ketones, ur: NEGATIVE mg/dL
Leukocytes, UA: NEGATIVE
Nitrite: NEGATIVE
Protein, ur: NEGATIVE mg/dL
Specific Gravity, Urine: 1.024 (ref 1.005–1.030)
Urobilinogen, UA: 0.2 mg/dL (ref 0.0–1.0)
pH: 6 (ref 5.0–8.0)

## 2012-02-04 MED ORDER — OXYCODONE-ACETAMINOPHEN 5-325 MG PO TABS: 2.0000 | ORAL_TABLET | ORAL | Status: DC | PRN

## 2012-02-04 MED ORDER — PROMETHAZINE HCL 25 MG PO TABS: 25.0000 mg | ORAL_TABLET | Freq: Four times a day (QID) | ORAL | Status: DC | PRN

## 2012-02-04 MED ORDER — HYDROMORPHONE HCL PF 1 MG/ML IJ SOLN
1.0000 mg | Freq: Once | INTRAMUSCULAR | Status: AC
  Administered 2012-02-04: 1 mg via INTRAVENOUS
  Filled 2012-02-04: qty 1

## 2012-02-04 NOTE — ED Notes (Signed)
Pt re medicated for pain, wife at bedside.

## 2012-03-14 ENCOUNTER — Ambulatory Visit (INDEPENDENT_AMBULATORY_CARE_PROVIDER_SITE_OTHER): Payer: BC Managed Care – PPO | Admitting: Family Medicine

## 2012-03-14 ENCOUNTER — Encounter: Payer: Self-pay | Admitting: Family Medicine

## 2012-03-14 VITALS — BP 140/98 | HR 84 | Temp 98.1°F | Ht 72.0 in | Wt 280.2 lb

## 2012-03-14 DIAGNOSIS — R1011 Right upper quadrant pain: Secondary | ICD-10-CM | POA: Insufficient documentation

## 2012-03-14 DIAGNOSIS — D45 Polycythemia vera: Secondary | ICD-10-CM

## 2012-03-14 DIAGNOSIS — F329 Major depressive disorder, single episode, unspecified: Secondary | ICD-10-CM | POA: Insufficient documentation

## 2012-03-14 DIAGNOSIS — F341 Dysthymic disorder: Secondary | ICD-10-CM

## 2012-03-14 DIAGNOSIS — G8929 Other chronic pain: Secondary | ICD-10-CM | POA: Insufficient documentation

## 2012-03-14 DIAGNOSIS — R109 Unspecified abdominal pain: Secondary | ICD-10-CM

## 2012-03-14 DIAGNOSIS — D751 Secondary polycythemia: Secondary | ICD-10-CM

## 2012-03-14 DIAGNOSIS — N200 Calculus of kidney: Secondary | ICD-10-CM

## 2012-03-14 DIAGNOSIS — R1030 Lower abdominal pain, unspecified: Secondary | ICD-10-CM

## 2012-03-14 DIAGNOSIS — G4700 Insomnia, unspecified: Secondary | ICD-10-CM

## 2012-03-14 DIAGNOSIS — F32A Depression, unspecified: Secondary | ICD-10-CM | POA: Insufficient documentation

## 2012-03-14 DIAGNOSIS — G40909 Epilepsy, unspecified, not intractable, without status epilepticus: Secondary | ICD-10-CM

## 2012-03-14 DIAGNOSIS — F419 Anxiety disorder, unspecified: Secondary | ICD-10-CM | POA: Insufficient documentation

## 2012-03-14 MED ORDER — ZOLPIDEM TARTRATE 10 MG PO TABS: 10.0000 mg | ORAL_TABLET | Freq: Every evening | ORAL | Status: DC | PRN
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
## 2012-03-14 NOTE — Assessment & Plan Note (Signed)
Per heme, requesting records.

## 2012-03-14 NOTE — Assessment & Plan Note (Signed)
Per urology, requesting records.

## 2012-03-14 NOTE — Assessment & Plan Note (Signed)
No sx in years.  Follow clinically.

## 2012-03-14 NOTE — Assessment & Plan Note (Signed)
He'll notify me about prev meds used.

## 2012-03-14 NOTE — Assessment & Plan Note (Signed)
Per urology, requesting records.   

## 2012-03-14 NOTE — Patient Instructions (Addendum)
See about what meds you had taken previously for panic/anxiety and let me know.  We'll request your records.   I'll await the hematology reports.   Take care.

## 2012-03-14 NOTE — Assessment & Plan Note (Signed)
Unclear source, CT reviewed along with available labs.  Requesting outside records and will follow clinically for now.

## 2012-03-14 NOTE — Assessment & Plan Note (Signed)
Continue ambien for now.  D/w pt about sleep hygiene.  We can address this more after he has uro and heme f/u.

## 2012-03-21 ENCOUNTER — Telehealth: Payer: Self-pay

## 2012-03-21 NOTE — Telephone Encounter (Signed)
Some of his old records just came in.  Let me check those and we'll be in touch.

## 2012-03-21 NOTE — Telephone Encounter (Signed)
Pt left v/m when pt seen 03/14/12 with chest issues; pt said pain is slightly worse than when seen but primarily now on right side of chest and upper abdomen. Pt said feels and looks a bit more bloated than when seen.Please advise.

## 2012-03-22 ENCOUNTER — Telehealth: Payer: Self-pay | Admitting: Family Medicine

## 2012-03-22 NOTE — Telephone Encounter (Signed)
Call pt.  Was seen by [facility] hematology.  They were going to recheck his LFTs and possibly refer to GI if consistently elevated.  Please get those labs and find out the status of this possible referral.  I want to know about that before we do anything about his abdominal symptoms.

## 2012-03-22 NOTE — Telephone Encounter (Signed)
Faxed for recent LFT's and status on possible GI referral.

## 2012-03-22 NOTE — Telephone Encounter (Signed)
Patient advised.

## 2012-03-25 ENCOUNTER — Emergency Department (HOSPITAL_COMMUNITY)
Admission: EM | Admit: 2012-03-25 | Discharge: 2012-03-26 | Disposition: A | Discharge status: Home / Self Care | Payer: BC Managed Care – PPO | Attending: Emergency Medicine | Admitting: Emergency Medicine

## 2012-03-25 ENCOUNTER — Encounter (HOSPITAL_COMMUNITY): Payer: Self-pay | Admitting: Emergency Medicine

## 2012-03-25 DIAGNOSIS — K219 Gastro-esophageal reflux disease without esophagitis: Secondary | ICD-10-CM | POA: Insufficient documentation

## 2012-03-25 DIAGNOSIS — G40909 Epilepsy, unspecified, not intractable, without status epilepticus: Secondary | ICD-10-CM | POA: Insufficient documentation

## 2012-03-25 DIAGNOSIS — R109 Unspecified abdominal pain: Secondary | ICD-10-CM | POA: Insufficient documentation

## 2012-03-25 DIAGNOSIS — F3289 Other specified depressive episodes: Secondary | ICD-10-CM | POA: Insufficient documentation

## 2012-03-25 DIAGNOSIS — E669 Obesity, unspecified: Secondary | ICD-10-CM | POA: Insufficient documentation

## 2012-03-25 DIAGNOSIS — Z87442 Personal history of urinary calculi: Secondary | ICD-10-CM | POA: Insufficient documentation

## 2012-03-25 DIAGNOSIS — Z9089 Acquired absence of other organs: Secondary | ICD-10-CM | POA: Insufficient documentation

## 2012-03-25 DIAGNOSIS — Z8659 Personal history of other mental and behavioral disorders: Secondary | ICD-10-CM | POA: Insufficient documentation

## 2012-03-25 DIAGNOSIS — F329 Major depressive disorder, single episode, unspecified: Secondary | ICD-10-CM | POA: Insufficient documentation

## 2012-03-25 DIAGNOSIS — G4700 Insomnia, unspecified: Secondary | ICD-10-CM | POA: Insufficient documentation

## 2012-03-25 HISTORY — DX: Calculus of kidney: N20.0

## 2012-03-25 LAB — URINALYSIS, ROUTINE W REFLEX MICROSCOPIC
Bilirubin Urine: NEGATIVE
Glucose, UA: NEGATIVE mg/dL
Hgb urine dipstick: NEGATIVE
Ketones, ur: NEGATIVE mg/dL
Leukocytes, UA: NEGATIVE
Nitrite: NEGATIVE
Protein, ur: NEGATIVE mg/dL
Specific Gravity, Urine: 1.025 (ref 1.005–1.030)
Urobilinogen, UA: 1 mg/dL (ref 0.0–1.0)
pH: 5.5 (ref 5.0–8.0)

## 2012-03-25 LAB — CBC WITH DIFFERENTIAL/PLATELET
Basophils Absolute: 0 10*3/uL (ref 0.0–0.1)
Basophils Relative: 0 % (ref 0–1)
Eosinophils Absolute: 0.2 10*3/uL (ref 0.0–0.7)
Eosinophils Relative: 3 % (ref 0–5)
HCT: 42.5 % (ref 39.0–52.0)
Hemoglobin: 15.4 g/dL (ref 13.0–17.0)
Lymphocytes Relative: 39 % (ref 12–46)
Lymphs Abs: 3 10*3/uL (ref 0.7–4.0)
MCH: 30.9 pg (ref 26.0–34.0)
MCHC: 36.2 g/dL — ABNORMAL HIGH (ref 30.0–36.0)
MCV: 85.3 fL (ref 78.0–100.0)
Monocytes Absolute: 0.9 10*3/uL (ref 0.1–1.0)
Monocytes Relative: 11 % (ref 3–12)
Neutro Abs: 3.7 10*3/uL (ref 1.7–7.7)
Neutrophils Relative %: 47 % (ref 43–77)
Platelets: 315 10*3/uL (ref 150–400)
RBC: 4.98 MIL/uL (ref 4.22–5.81)
RDW: 13.1 % (ref 11.5–15.5)
WBC: 7.7 10*3/uL (ref 4.0–10.5)

## 2012-03-25 LAB — COMPREHENSIVE METABOLIC PANEL
ALT: 22 U/L (ref 0–53)
AST: 19 U/L (ref 0–37)
Albumin: 4 g/dL (ref 3.5–5.2)
Alkaline Phosphatase: 61 U/L (ref 39–117)
BUN: 9 mg/dL (ref 6–23)
CO2: 26 mEq/L (ref 19–32)
Calcium: 9.3 mg/dL (ref 8.4–10.5)
Chloride: 103 mEq/L (ref 96–112)
Creatinine, Ser: 0.88 mg/dL (ref 0.50–1.35)
GFR calc Af Amer: >90 mL/min (ref >90)
GFR calc non Af Amer: >90 mL/min (ref >90)
Glucose, Bld: 106 mg/dL — ABNORMAL HIGH (ref 70–99)
Potassium: 3.6 mEq/L (ref 3.5–5.1)
Sodium: 139 mEq/L (ref 135–145)
Total Bilirubin: 0.4 mg/dL (ref 0.3–1.2)
Total Protein: 6.7 g/dL (ref 6.0–8.3)

## 2012-03-25 LAB — LIPASE, BLOOD: Lipase: 56 U/L (ref 11–59)

## 2012-03-25 MED ORDER — SODIUM CHLORIDE 0.9 % IV SOLN
1000.0000 mL | Freq: Once | INTRAVENOUS | Status: AC
  Administered 2012-03-25: 1000 mL via INTRAVENOUS

## 2012-03-25 MED ORDER — ONDANSETRON HCL 4 MG/2ML IJ SOLN
4.0000 mg | Freq: Once | INTRAMUSCULAR | Status: AC
  Administered 2012-03-25: 4 mg via INTRAVENOUS
  Filled 2012-03-25: qty 2

## 2012-03-25 MED ORDER — HYDROMORPHONE HCL PF 1 MG/ML IJ SOLN
1.0000 mg | INTRAMUSCULAR | Status: DC | PRN
  Administered 2012-03-25 (×2): 1 mg via INTRAVENOUS
  Filled 2012-03-25 (×2): qty 1

## 2012-03-25 MED ORDER — SODIUM CHLORIDE 0.9 % IV SOLN
1000.0000 mL | INTRAVENOUS | Status: DC
  Administered 2012-03-25: 1000 mL via INTRAVENOUS

## 2012-03-25 MED ORDER — ONDANSETRON HCL 4 MG PO TABS: 4.0000 mg | ORAL_TABLET | Freq: Three times a day (TID) | ORAL | Status: DC | PRN

## 2012-03-25 MED ORDER — TRAMADOL HCL 50 MG PO TABS: 50.0000 mg | ORAL_TABLET | Freq: Four times a day (QID) | ORAL | Status: DC | PRN

## 2012-03-25 NOTE — ED Provider Notes (Signed)
History    CSN: [id number] Arrival date & time 03/25/12  2104 First MD Initiated Contact with Patient 03/25/12 2131      Chief Complaint  Patient presents with  . Flank Pain   Patient is a 32 y.o. male presenting with flank pain. The history is provided by the patient.  Flank Pain This is a recurrent problem. Episode onset: He has a long history of kidney stones.  He had an acute flare that started today. The problem occurs constantly. The problem has been gradually worsening. Associated symptoms include abdominal pain. Pertinent negatives include no chest pain, no headaches and no shortness of breath. Nothing aggravates the symptoms. Nothing relieves the symptoms.  THe pain is sharp in the right flank and radiates to the groin. Pt sees a urologist at [place] but he prefers to come to [facility] ED.  He has been several times in the past for similar complaints.  He had cystoscopy and laser destruction of kidney stones in the last few months.  These symptoms are similar to his prior kidney stones.   Past Medical History  Diagnosis Date  . GERD (gastroesophageal reflux disease)   . History of kidney stones   . Seizures Childhood  . Depression     and panic/anxiety  . Insomnia   . Kidney stones     Past Surgical History  Procedure Laterality Date  . Lithotripsy      four times over the years  . Appendectomy    . Cholecystectomy    . Tonsillectomy      Family History  Problem Relation Age of Onset  . Stroke Mother   . Heart disease Father     History  Substance Use Topics  . Smoking status: Never Smoker   . Smokeless tobacco: Never Used  . Alcohol Use: Yes     Comment: rarely      Review of Systems  Respiratory: Negative for shortness of breath.   Cardiovascular: Negative for chest pain.  Gastrointestinal: Positive for abdominal pain.  Genitourinary: Positive for flank pain.  Neurological: Negative for headaches.  All other systems reviewed and are  negative.    Allergies  Oxycodone  Home Medications   Current Outpatient Rx  Name  Route  Sig  Dispense  Refill  . HYDROcodone-acetaminophen (NORCO/VICODIN) 5-325 MG per tablet   Oral   Take 2 tablets by mouth every 4 (four) hours as needed for pain.   10 tablet   0   . ibuprofen (ADVIL,MOTRIN) 800 MG tablet   Oral   Take 800 mg by mouth every 8 (eight) hours as needed for pain.         Marland Kitchen zolpidem (AMBIEN) 10 MG tablet   Oral   Take 1 tablet (10 mg total) by mouth at bedtime as needed for sleep.   30 tablet   1   . ondansetron (ZOFRAN) 4 MG tablet   Oral   Take 1 tablet (4 mg total) by mouth every 8 (eight) hours as needed for nausea.   20 tablet   0   . traMADol (ULTRAM) 50 MG tablet   Oral   Take 1 tablet (50 mg total) by mouth every 6 (six) hours as needed for pain.   30 tablet   0     BP 160/100  Pulse 86  Temp(Src) 97.8 F (36.6 C) (Oral)  Resp 20  SpO2 100%  Physical Exam  Nursing note and vitals reviewed. Constitutional: He appears well-developed and well-nourished.  No distress.  Obese   HENT:  Head: Normocephalic and atraumatic.  Right Ear: External ear normal.  Left Ear: External ear normal.  Eyes: Conjunctivae are normal. Right eye exhibits no discharge. Left eye exhibits no discharge. No scleral icterus.  Neck: Neck supple. No tracheal deviation present.  Cardiovascular: Normal rate, regular rhythm and intact distal pulses.   Pulmonary/Chest: Effort normal and breath sounds normal. No stridor. No respiratory distress. He has no wheezes. He has no rales.  Abdominal: Soft. Bowel sounds are normal. He exhibits no distension and no mass. There is tenderness in the right upper quadrant. There is CVA tenderness (right). There is no rigidity, no rebound, no guarding, no tenderness at McBurney's point and negative Murphy's sign.  Musculoskeletal: He exhibits no edema and no tenderness.  Neurological: He is alert. He has normal strength. No sensory  deficit. Cranial nerve deficit:  no gross defecits noted. He exhibits normal muscle tone. He displays no seizure activity. Coordination normal.  Skin: Skin is warm and dry. No rash noted. He is not diaphoretic.  Psychiatric: He has a normal mood and affect.    ED Course  Procedures (including critical care time)  Labs Reviewed  COMPREHENSIVE METABOLIC PANEL - Abnormal; Notable for the following:    Glucose, Bld 106 (*)    All other components within normal limits  CBC WITH DIFFERENTIAL - Abnormal; Notable for the following:    MCHC 36.2 (*)    All other components within normal limits  URINALYSIS, ROUTINE W REFLEX MICROSCOPIC  LIPASE, BLOOD   No results found.   1. Flank pain       MDM  Probable recurrent renal colic although no hematuria.  Labs otherwise normal.  Will dc home.  Follow up with urologist.        Celene Kras, MD 03/25/12 [phone]

## 2012-03-25 NOTE — ED Notes (Signed)
Pt in pain bent over bed, guarding abdomen. States he has h/o of kidney stones. Had lithotripsy recently. Admits to appendectomy and cholecystectomy. Some distention noted RUQ.

## 2012-03-25 NOTE — ED Notes (Signed)
Pt c/o flank pain has been on going ,worse today.Hx Kidney stones.VSS

## 2012-03-27 NOTE — Telephone Encounter (Signed)
I'll check the hard copy.

## 2012-03-27 NOTE — Telephone Encounter (Signed)
Records arrived, in your In Box.

## 2012-03-29 ENCOUNTER — Encounter: Payer: Self-pay | Admitting: Family Medicine

## 2012-03-30 ENCOUNTER — Telehealth: Payer: Self-pay | Admitting: Family Medicine

## 2012-03-30 NOTE — Telephone Encounter (Signed)
Call pt.  Records reviewed.  His LFTs had normalized.  Heme notes mentioned f/u OV for ~2 weeks after the initial visit- has this happened and can we get notes? I cannot think of any obvious cause for the abdominal wall symptoms he had and I'd like to hear if any of the other docs in his care had any input on this.

## 2012-04-02 ENCOUNTER — Encounter: Payer: Self-pay | Admitting: Family Medicine

## 2012-04-02 NOTE — Telephone Encounter (Signed)
LMOVM to return call.

## 2012-04-03 NOTE — Telephone Encounter (Signed)
Patient advised.  He has had his 2 week follow up.  Pt says he signed the release for notes to be faxed here.  He could not remember the MD's name that he saw.  How can I get that info?

## 2012-04-05 NOTE — Telephone Encounter (Signed)
Re-faxed for 03/28/12 note.

## 2012-04-05 NOTE — Telephone Encounter (Signed)
I think these came in and were placed in your in box but had actually already been scanned into the system.  Is this correct or did you not get what you needed?

## 2012-04-05 NOTE — Telephone Encounter (Signed)
It should be on the hematology notes that were prev scanned.  Thanks.

## 2012-04-05 NOTE — Telephone Encounter (Signed)
This was a repeat of the initial.  I need the second hematology OV note.

## 2012-04-06 ENCOUNTER — Ambulatory Visit (INDEPENDENT_AMBULATORY_CARE_PROVIDER_SITE_OTHER): Payer: BC Managed Care – PPO | Admitting: Family Medicine

## 2012-04-06 ENCOUNTER — Encounter: Payer: Self-pay | Admitting: Family Medicine

## 2012-04-06 VITALS — BP 122/88 | HR 86 | Temp 98.0°F

## 2012-04-06 DIAGNOSIS — G4700 Insomnia, unspecified: Secondary | ICD-10-CM

## 2012-04-06 DIAGNOSIS — R109 Unspecified abdominal pain: Secondary | ICD-10-CM

## 2012-04-06 DIAGNOSIS — F341 Dysthymic disorder: Secondary | ICD-10-CM

## 2012-04-06 DIAGNOSIS — R1011 Right upper quadrant pain: Secondary | ICD-10-CM

## 2012-04-06 DIAGNOSIS — F419 Anxiety disorder, unspecified: Secondary | ICD-10-CM

## 2012-04-06 DIAGNOSIS — F32A Depression, unspecified: Secondary | ICD-10-CM

## 2012-04-06 DIAGNOSIS — F329 Major depressive disorder, single episode, unspecified: Secondary | ICD-10-CM

## 2012-04-06 NOTE — Progress Notes (Signed)
Hematology notes reviewed.  LFTs normalized and no clear pathology for polycythemia found.  There was a concern for sleep disorder, possible OSA.  D/w pt today.   Has a f/u with neuro pending in [place] with Dr. Harless Nakayama.  He will likely have a sleep study through [facility] clinic.    He checked his old meds, prev used for anxiety and insomnia.  Prev was on trazodone (lack of effect), klonopin (was sedated and sluggish with this), citalopram (lack of effect).  He has used [place] and melatonin and he still isn't sleeping well.  His work schedule has been altered recently and this hasn't help his sleep schedule.   He's considering transferring uro care back to [facility] in [place] for eval and treatment of pelvic pain.  I'll defer to him on this.    He continues to have abd pain, but it appears now to be in the RUQ and not superficially as prev thought.  S/p cholecystectomy and appy.  He does have frequent nausea and also can have diarrhea after meals.  The pain is now generally constant and has been going on for months; it may be worse after meals.  No blood in stool noted by patient.   Meds, vitals, and allergies reviewed.   ROS: See HPI.  Otherwise, noncontributory.  nad Ncat, uncomfortable from pelvic pain, at baseline rrr ctab abd soft, ttp in the RUQ, no rebound, normal BS Normal inspection of abdomen Ext w/o edema; well perfused.  Speech and affect wnl.

## 2012-04-06 NOTE — Patient Instructions (Addendum)
Go to the lab on the way out.  We'll contact you with your lab report.  I'll check with the GI and neuro departments.  I'll await the urology notes.  Take care.

## 2012-04-07 NOTE — Assessment & Plan Note (Signed)
No SIHI, we'll address this after he has f/u with neuro about his sleep problems.

## 2012-04-07 NOTE — Assessment & Plan Note (Signed)
This could be a primary sleep disorder or related to anxiety.  It is unclear which is causative, but either would likely make the other worse.  I'll check on local neuro clinics availability per his request.  We discussed the basics of OSA and how this could affect his hgb levels and energy level.

## 2012-04-07 NOTE — Assessment & Plan Note (Signed)
I'd like to talk to GI about this and get input on potential utility of HIDA.  Prev CT unremarkable.  I'll get back in touch with patient.  Will check IFOB in meantime.  >25 min spent with face to face with patient, >50% counseling and/or coordinating care

## 2012-04-09 ENCOUNTER — Telehealth: Payer: Self-pay | Admitting: Family Medicine

## 2012-04-09 ENCOUNTER — Encounter: Payer: Self-pay | Admitting: Family Medicine

## 2012-04-09 DIAGNOSIS — R1011 Right upper quadrant pain: Secondary | ICD-10-CM

## 2012-04-09 LAB — WBC
ALT: 28 U/L (ref 10–40)
AST: 20 U/L
Creat: 0.72
Hemoglobin: 17.6 g/dL — AB (ref 13.5–17.5)
WBC: 8
platelet count: 308

## 2012-04-09 LAB — CREATININE, SERUM
ALT: 111 U/L — AB (ref 10–40)
ALT: 33 U/L (ref 10–40)
AST: 25 U/L
AST: 44 U/L
Creat: 0.78
Creat: 1

## 2012-04-09 LAB — ALT
ALT: 23 U/L (ref 10–40)
AST: 17 U/L

## 2012-04-09 LAB — HEMOGLOBIN: Hemoglobin: 16.9 g/dL (ref 13.5–17.5)

## 2012-04-09 NOTE — Telephone Encounter (Signed)
Please call pt.  I talked with Dr. Arlyce Dice.  It would be reasonable to have him see GI in the meantime about the RUQ pain.  I wouldn't do any other testing in the meantime.  It would be reasonable to try taking prilosec 20mg  a day (otc) between now and the GI appointment just to see if it had any effect on his symptoms.  Thanks. I put in the referral.

## 2012-04-09 NOTE — Telephone Encounter (Signed)
Patient advised.

## 2012-04-10 ENCOUNTER — Emergency Department (HOSPITAL_COMMUNITY): Payer: BC Managed Care – PPO

## 2012-04-10 ENCOUNTER — Encounter: Payer: Self-pay | Admitting: Internal Medicine

## 2012-04-10 ENCOUNTER — Emergency Department (HOSPITAL_COMMUNITY)
Admission: EM | Admit: 2012-04-10 | Discharge: 2012-04-11 | Disposition: A | Discharge status: Home / Self Care | Payer: BC Managed Care – PPO | Attending: Emergency Medicine | Admitting: Emergency Medicine

## 2012-04-10 ENCOUNTER — Telehealth: Payer: Self-pay | Admitting: Family Medicine

## 2012-04-10 ENCOUNTER — Encounter (HOSPITAL_COMMUNITY): Payer: Self-pay | Admitting: Emergency Medicine

## 2012-04-10 DIAGNOSIS — Z87442 Personal history of urinary calculi: Secondary | ICD-10-CM | POA: Insufficient documentation

## 2012-04-10 DIAGNOSIS — R109 Unspecified abdominal pain: Secondary | ICD-10-CM | POA: Insufficient documentation

## 2012-04-10 DIAGNOSIS — G40909 Epilepsy, unspecified, not intractable, without status epilepticus: Secondary | ICD-10-CM

## 2012-04-10 DIAGNOSIS — Z8659 Personal history of other mental and behavioral disorders: Secondary | ICD-10-CM | POA: Insufficient documentation

## 2012-04-10 DIAGNOSIS — Z8719 Personal history of other diseases of the digestive system: Secondary | ICD-10-CM | POA: Insufficient documentation

## 2012-04-10 DIAGNOSIS — G4700 Insomnia, unspecified: Secondary | ICD-10-CM | POA: Insufficient documentation

## 2012-04-10 LAB — BASIC METABOLIC PANEL
BUN: 18 mg/dL (ref 6–23)
CO2: 26 mEq/L (ref 19–32)
Calcium: 9.9 mg/dL (ref 8.4–10.5)
Chloride: 100 mEq/L (ref 96–112)
Creatinine, Ser: 0.8 mg/dL (ref 0.50–1.35)
GFR calc Af Amer: >90 mL/min (ref >90)
GFR calc non Af Amer: >90 mL/min (ref >90)
Glucose, Bld: 94 mg/dL (ref 70–99)
Potassium: 4.3 mEq/L (ref 3.5–5.1)
Sodium: 137 mEq/L (ref 135–145)

## 2012-04-10 LAB — URINE MICROSCOPIC-ADD ON

## 2012-04-10 LAB — URINALYSIS, ROUTINE W REFLEX MICROSCOPIC
Bilirubin Urine: NEGATIVE
Glucose, UA: NEGATIVE mg/dL
Ketones, ur: NEGATIVE mg/dL
Leukocytes, UA: NEGATIVE
Nitrite: NEGATIVE
Protein, ur: NEGATIVE mg/dL
Specific Gravity, Urine: 1.032 — ABNORMAL HIGH (ref 1.005–1.030)
Urobilinogen, UA: 1 mg/dL (ref 0.0–1.0)
pH: 6 (ref 5.0–8.0)

## 2012-04-10 MED ORDER — HYDROMORPHONE HCL PF 1 MG/ML IJ SOLN
1.0000 mg | Freq: Once | INTRAMUSCULAR | Status: AC
  Administered 2012-04-10: 1 mg via INTRAVENOUS
  Filled 2012-04-10: qty 1

## 2012-04-10 MED ORDER — ONDANSETRON HCL 4 MG/2ML IJ SOLN
4.0000 mg | Freq: Once | INTRAMUSCULAR | Status: AC
  Administered 2012-04-10: 4 mg via INTRAVENOUS
  Filled 2012-04-10: qty 2

## 2012-04-10 MED ORDER — SODIUM CHLORIDE 0.9 % IV BOLUS (SEPSIS)
1000.0000 mL | Freq: Once | INTRAVENOUS | Status: AC
  Administered 2012-04-10: 1000 mL via INTRAVENOUS

## 2012-04-10 MED ORDER — HYDROCODONE-ACETAMINOPHEN 5-325 MG PO TABS: 1.0000 | ORAL_TABLET | Freq: Four times a day (QID) | ORAL | Status: DC | PRN

## 2012-04-10 NOTE — ED Provider Notes (Signed)
History     CSN: [id number]  Arrival date & time 04/10/12  2120   First MD Initiated Contact with Patient 04/10/12 2133      Chief Complaint  Patient presents with  . Flank Pain    HPI  Patient presents with new flank pain.  Notably, the patient is a long history of kidney stones, he typically sees a urologist in [facility].  He states that today, several hours ago the pain became severe, bilaterally in the mid back.  The pain is nonradiating, associated with nausea, no vomiting.  No fever, no dysuria or hematuria. Patient's last procedure was several months ago when he had lithotripsy. The patient has been seen in this facility several times, including by me several months ago. This episode of pain is not appreciably improved by OTC medication.  There is no clear exacerbating factor.   Past Medical History  Diagnosis Date  . GERD (gastroesophageal reflux disease)   . History of kidney stones   . Seizures Childhood  . Depression     and panic/anxiety  . Insomnia   . Kidney stones     Past Surgical History  Procedure Laterality Date  . Lithotripsy      four times over the years  . Appendectomy    . Cholecystectomy    . Tonsillectomy      Family History  Problem Relation Age of Onset  . Stroke Mother   . Heart disease Father     History  Substance Use Topics  . Smoking status: Never Smoker   . Smokeless tobacco: Never Used  . Alcohol Use: Yes     Comment: rarely      Review of Systems  Constitutional:       Per HPI, otherwise negative  HENT:       Per HPI, otherwise negative  Respiratory:       Per HPI, otherwise negative  Cardiovascular:       Per HPI, otherwise negative  Gastrointestinal: Negative for vomiting.  Endocrine:       Negative aside from HPI  Genitourinary:       Neg aside from HPI   Musculoskeletal:       Per HPI, otherwise negative  Skin: Negative.   Neurological: Negative for syncope.    Allergies  Oxycodone  Home  Medications   Current Outpatient Rx  Name  Route  Sig  Dispense  Refill  . ibuprofen (ADVIL,MOTRIN) 200 MG tablet   Oral   Take 400 mg by mouth every 6 (six) hours as needed for pain. For pain         . zolpidem (AMBIEN) 10 MG tablet   Oral   Take 1 tablet (10 mg total) by mouth at bedtime as needed for sleep.   30 tablet   1   . HYDROcodone-acetaminophen (NORCO/VICODIN) 5-325 MG per tablet   Oral   Take 1 tablet by mouth every 6 (six) hours as needed for pain.   15 tablet   0     BP 138/90  Pulse 104  Temp(Src) 97.8 F (36.6 C) (Oral)  SpO2 100%  Physical Exam  Nursing note and vitals reviewed. Constitutional: He appears well-developed and well-nourished. No distress.  Obese   HENT:  Head: Normocephalic and atraumatic.  Right Ear: External ear normal.  Left Ear: External ear normal.  Eyes: Conjunctivae are normal. Right eye exhibits no discharge. Left eye exhibits no discharge. No scleral icterus.  Neck: Neck supple. No tracheal  deviation present.  Cardiovascular: Normal rate, regular rhythm and intact distal pulses.   Pulmonary/Chest: Effort normal and breath sounds normal. No stridor. No respiratory distress. He has no wheezes. He has no rales.  Abdominal: Soft. Bowel sounds are normal. He exhibits no distension and no mass. There is no tenderness. There is CVA tenderness (right). There is no rigidity, no rebound, no guarding, no tenderness at McBurney's point and negative Murphy's sign.  Musculoskeletal: He exhibits no edema and no tenderness.  Neurological: He is alert. He has normal strength. No sensory deficit. Cranial nerve deficit:  no gross defecits noted. He exhibits normal muscle tone. He displays no seizure activity. Coordination normal.  Skin: Skin is warm and dry. No rash noted. He is not diaphoretic.  Psychiatric: He has a normal mood and affect.    ED Course  Procedures (including critical care time)  Following initial evaluation I reviewed the  patient's chart, including my encounters with him, and his recent radiographic studies here.  Given the absence of distress, the patient has mild tachycardia, KUB was ordered.  We discussed the need to minimize additional radiation exposure prior to this, the patient was amenable to this plan.  Labs Reviewed  URINALYSIS, ROUTINE W REFLEX MICROSCOPIC - Abnormal; Notable for the following:    APPearance CLOUDY (*)    Specific Gravity, Urine 1.032 (*)    Hgb urine dipstick LARGE (*)    All other components within normal limits  BASIC METABOLIC PANEL  URINE MICROSCOPIC-ADD ON   Dg Abd 1 View  04/10/2012  *RADIOLOGY REPORT*  Clinical Data: Left-sided abdominal pain.  Current history of urinary tract calculi.  ABDOMEN - 1 VIEW  Comparison: CT abdomen and pelvis 02/04/2012.  One-view abdomen x- ray 11/27/2011.  Findings: Previously identified left renal calculus no longer visible.  Phleboliths low in the right side of the pelvis, unchanged.  No opaque urinary tract calculi on either side.  Normal bowel gas pattern.  IMPRESSION: No acute abdominal abnormality.  No visible opaque urinary tract calculi currently.   Original Report Authenticated By: Hulan Saas, M.D.      1. Flank pain, acute     11:41 PM Patient remains calm appearing, clearly in no distress.  We discussed return precautions, as well as the need to follow up with his urologist or a more local practitioner.  MDM  This young male with history kidney stones now presents with acute flank pain.  On exam he is mildly tachycardic, but afebrile and following analgesics fluids, he was in no distress.  Patient's labs do not demonstrate concern for concurrent infection, nor renal dysfunction.  The patient was appropriate for discharge with close outpatient followup     Gerhard Munch, MD 04/10/12 2342

## 2012-04-10 NOTE — ED Notes (Signed)
Pt states hx of kidney stones and has been having trouble with them for some time now but the pain intensified today. Bilateral flank pain that radiates to groin.

## 2012-04-10 NOTE — Telephone Encounter (Signed)
Notify pt.  I was waiting to hear back from neuro.  I would go ahead and see neuro locally per his request (esp with his SZ history).  If they can't arrange for the apnea testing, then we can arrange this through pulmonary after the fact.  I put in the neuro referral.  Thanks.

## 2012-04-10 NOTE — Telephone Encounter (Signed)
Pt called today and would like an Internal referral to a neurologist or [facility],(ultimately for a sleep study).    Best number for Casey Caldwell, if needed [phone].

## 2012-04-10 NOTE — Telephone Encounter (Signed)
Shirlee Limerick notified patient and is working on Neuro referral.

## 2012-04-20 ENCOUNTER — Encounter (HOSPITAL_COMMUNITY): Payer: Self-pay | Admitting: Emergency Medicine

## 2012-04-20 ENCOUNTER — Emergency Department (HOSPITAL_COMMUNITY)
Admission: EM | Admit: 2012-04-20 | Discharge: 2012-04-20 | Disposition: A | Discharge status: Home / Self Care | Payer: BC Managed Care – PPO | Attending: Emergency Medicine | Admitting: Emergency Medicine

## 2012-04-20 ENCOUNTER — Emergency Department (HOSPITAL_COMMUNITY): Payer: BC Managed Care – PPO

## 2012-04-20 DIAGNOSIS — G4700 Insomnia, unspecified: Secondary | ICD-10-CM | POA: Insufficient documentation

## 2012-04-20 DIAGNOSIS — Z8669 Personal history of other diseases of the nervous system and sense organs: Secondary | ICD-10-CM | POA: Insufficient documentation

## 2012-04-20 DIAGNOSIS — Z8659 Personal history of other mental and behavioral disorders: Secondary | ICD-10-CM | POA: Insufficient documentation

## 2012-04-20 DIAGNOSIS — Z8719 Personal history of other diseases of the digestive system: Secondary | ICD-10-CM | POA: Insufficient documentation

## 2012-04-20 DIAGNOSIS — N23 Unspecified renal colic: Secondary | ICD-10-CM

## 2012-04-20 DIAGNOSIS — Z87442 Personal history of urinary calculi: Secondary | ICD-10-CM | POA: Insufficient documentation

## 2012-04-20 DIAGNOSIS — R110 Nausea: Secondary | ICD-10-CM | POA: Insufficient documentation

## 2012-04-20 DIAGNOSIS — N509 Disorder of male genital organs, unspecified: Secondary | ICD-10-CM | POA: Insufficient documentation

## 2012-04-20 DIAGNOSIS — Z9089 Acquired absence of other organs: Secondary | ICD-10-CM | POA: Insufficient documentation

## 2012-04-20 LAB — POCT I-STAT, CHEM 8
BUN: 10 mg/dL (ref 6–23)
Calcium, Ion: 1.17 mmol/L (ref 1.12–1.23)
Chloride: 107 mEq/L (ref 96–112)
Creatinine, Ser: 0.8 mg/dL (ref 0.50–1.35)
Glucose, Bld: 114 mg/dL — ABNORMAL HIGH (ref 70–99)
HCT: 47 % (ref 39.0–52.0)
Hemoglobin: 16 g/dL (ref 13.0–17.0)
Potassium: 3.5 mEq/L (ref 3.5–5.1)
Sodium: 142 mEq/L (ref 135–145)
TCO2: 25 mmol/L (ref 0–100)

## 2012-04-20 LAB — URINALYSIS, ROUTINE W REFLEX MICROSCOPIC
Bilirubin Urine: NEGATIVE
Glucose, UA: NEGATIVE mg/dL
Hgb urine dipstick: NEGATIVE
Ketones, ur: NEGATIVE mg/dL
Leukocytes, UA: NEGATIVE
Nitrite: NEGATIVE
Protein, ur: NEGATIVE mg/dL
Specific Gravity, Urine: 1.033 — ABNORMAL HIGH (ref 1.005–1.030)
Urobilinogen, UA: 0.2 mg/dL (ref 0.0–1.0)
pH: 5.5 (ref 5.0–8.0)

## 2012-04-20 MED ORDER — MORPHINE SULFATE 2 MG/ML IJ SOLN
INTRAMUSCULAR | Status: AC
  Administered 2012-04-20: 2 mg via INTRAVENOUS
  Filled 2012-04-20: qty 1

## 2012-04-20 MED ORDER — ONDANSETRON HCL 4 MG/2ML IJ SOLN
4.0000 mg | Freq: Once | INTRAMUSCULAR | Status: AC
  Administered 2012-04-20: 4 mg via INTRAVENOUS
  Filled 2012-04-20: qty 2

## 2012-04-20 MED ORDER — SODIUM CHLORIDE 0.9 % IV BOLUS (SEPSIS)
1000.0000 mL | Freq: Once | INTRAVENOUS | Status: AC
  Administered 2012-04-20: 1000 mL via INTRAVENOUS

## 2012-04-20 MED ORDER — TAMSULOSIN HCL 0.4 MG PO CAPS: 0.4000 mg | ORAL_CAPSULE | Freq: Every day | ORAL | Status: DC

## 2012-04-20 MED ORDER — PHENAZOPYRIDINE HCL 200 MG PO TABS: 200.0000 mg | ORAL_TABLET | Freq: Three times a day (TID) | ORAL | Status: DC | PRN

## 2012-04-20 MED ORDER — MORPHINE SULFATE 4 MG/ML IJ SOLN
4.0000 mg | Freq: Once | INTRAMUSCULAR | Status: AC
  Administered 2012-04-20: 4 mg via INTRAVENOUS
  Filled 2012-04-20: qty 1

## 2012-04-20 MED ORDER — MORPHINE SULFATE 4 MG/ML IJ SOLN
6.0000 mg | Freq: Once | INTRAMUSCULAR | Status: AC
  Administered 2012-04-20: 6 mg via INTRAVENOUS
  Filled 2012-04-20: qty 1

## 2012-04-20 MED ORDER — KETOROLAC TROMETHAMINE 30 MG/ML IJ SOLN
30.0000 mg | Freq: Once | INTRAMUSCULAR | Status: AC
  Administered 2012-04-20: 30 mg via INTRAVENOUS
  Filled 2012-04-20: qty 1

## 2012-04-20 MED ORDER — HYDROCODONE-ACETAMINOPHEN 5-325 MG PO TABS: 1.0000 | ORAL_TABLET | ORAL | Status: DC | PRN

## 2012-04-20 NOTE — ED Provider Notes (Signed)
History     CSN: [id number]  Arrival date & time 04/20/12  4098   First MD Initiated Contact with Patient 04/20/12 2008      Chief Complaint  Patient presents with  . Flank Pain   HPI  History provided by the patient. Patient is a 32 year old male with history of multiple past kidney stones presenting with complaints of flank and back pain as well as testicle pain and discomfort. Patient reports a long history of testicle pain that he states is from a hydrocele found by his urologist in [facility]. This has been ongoing for several months. He also reports multiple kidney stones that frequently cause him flank and back pains. He has had several episodes within the last few weeks. He states he has generally been doing well and after returning home about an hour prior to arrival to the emergency room he suddenly developed acute sharp back and flank pains as well as increased left testicle pain. He denies any aggravating or alleviating factors. Symptoms are associated with slight nausea but no episodes of vomiting. No fever, chills or sweats. He had no urinary complaints early in the day. No dysuria, hematuria urinary frequency. No significant diarrhea or constipation complaints. Patient has not used anything for his symptoms.    Past Medical History  Diagnosis Date  . GERD (gastroesophageal reflux disease)   . History of kidney stones   . Seizures Childhood  . Depression     and panic/anxiety  . Insomnia   . Kidney stones     Past Surgical History  Procedure Laterality Date  . Lithotripsy      four times over the years  . Appendectomy    . Cholecystectomy    . Tonsillectomy      Family History  Problem Relation Age of Onset  . Stroke Mother   . Heart disease Father     History  Substance Use Topics  . Smoking status: Never Smoker   . Smokeless tobacco: Never Used  . Alcohol Use: Yes     Comment: rarely      Review of Systems  Constitutional: Negative for fever,  chills, diaphoresis and appetite change.  Gastrointestinal: Positive for nausea and abdominal pain. Negative for vomiting, diarrhea and constipation.  Genitourinary: Positive for flank pain and testicular pain. Negative for frequency, hematuria, scrotal swelling and penile pain.  All other systems reviewed and are negative.    Allergies  Oxycodone  Home Medications   Current Outpatient Rx  Name  Route  Sig  Dispense  Refill  . HYDROcodone-acetaminophen (NORCO/VICODIN) 5-325 MG per tablet   Oral   Take 1 tablet by mouth every 6 (six) hours as needed for pain.   15 tablet   0   . ibuprofen (ADVIL,MOTRIN) 200 MG tablet   Oral   Take 400 mg by mouth every 6 (six) hours as needed for pain. For pain         . zolpidem (AMBIEN) 10 MG tablet   Oral   Take 1 tablet (10 mg total) by mouth at bedtime as needed for sleep.   30 tablet   1     BP 140/96  Pulse 95  Temp(Src) 97.9 F (36.6 C) (Oral)  Resp 20  Wt 280 lb (127.007 kg)  BMI 37.97 kg/m2  SpO2 100%  Physical Exam  Nursing note and vitals reviewed. Constitutional: He is oriented to person, place, and time. He appears well-developed and well-nourished. No distress.  HENT:  Head:  Normocephalic.  Cardiovascular: Normal rate and regular rhythm.   No murmur heard. Pulmonary/Chest: Effort normal and breath sounds normal. No respiratory distress.  Abdominal: Soft. There is tenderness in the right upper quadrant and left upper quadrant. There is CVA tenderness. There is no rebound, no guarding, no tenderness at McBurney's point and negative Murphy's sign.  Bilateral CVA tenderness. Left greater than right.  Genitourinary: Penis normal.  Tenderness around the left testicle and epididymis. There is no significant swelling or mass. Scrotum normal. No rashes or lesions. No penile discharge.  Musculoskeletal: Normal range of motion.  Neurological: He is alert and oriented to person, place, and time.  Skin: Skin is warm.   Psychiatric: He has a normal mood and affect. His behavior is normal.    ED Course  Procedures   Results for orders placed during the hospital encounter of 04/20/12  URINALYSIS, ROUTINE W REFLEX MICROSCOPIC      Result Value Range   Color, Urine YELLOW  YELLOW   APPearance CLEAR  CLEAR   Specific Gravity, Urine 1.033 (*) 1.005 - 1.030   pH 5.5  5.0 - 8.0   Glucose, UA NEGATIVE  NEGATIVE mg/dL   Hgb urine dipstick NEGATIVE  NEGATIVE   Bilirubin Urine NEGATIVE  NEGATIVE   Ketones, ur NEGATIVE  NEGATIVE mg/dL   Protein, ur NEGATIVE  NEGATIVE mg/dL   Urobilinogen, UA 0.2  0.0 - 1.0 mg/dL   Nitrite NEGATIVE  NEGATIVE   Leukocytes, UA NEGATIVE  NEGATIVE  POCT I-STAT, CHEM 8      Result Value Range   Sodium 142  135 - 145 mEq/L   Potassium 3.5  3.5 - 5.1 mEq/L   Chloride 107  96 - 112 mEq/L   BUN 10  6 - 23 mg/dL   Creatinine, Ser 1.61  0.50 - 1.35 mg/dL   Glucose, Bld 096 (*) 70 - 99 mg/dL   Calcium, Ion 0.45  4.09 - 1.23 mmol/L   TCO2 25  0 - 100 mmol/L   Hemoglobin 16.0  13.0 - 17.0 g/dL   HCT 81.1  91.4 - 78.2 %      Dg Abd 1 View  04/20/2012  *RADIOLOGY REPORT*  Clinical Data: Bilateral flank, right testicular pain  ABDOMEN - 1 VIEW  Comparison: 04/10/2012  Findings: Surgical clips right upper quadrant question cholecystectomy. Small calcifications in right pelvis are rounded and unchanged question phleboliths. No definite urinary tract calcification identified. Bowel gas pattern normal. No acute osseous findings.  IMPRESSION: No acute abnormalities.   Original Report Authenticated By: Ulyses Southward, M.D.      1. Ureteral colic       MDM  8:10 PM patient seen and evaluated. Patient appears in moderate discomfort but no acute distress. He has history of multiple kidney stones in the past presenting with similar symptoms today. We'll begin initial workup with i-STAT, urinalysis and KUB. Pain medications and antiemetics ordered.  Patient feeling better after pain  medications. Labs and x-ray did not show any acute or concerning findings. Patient has good outpatient followup and can be discharged at this time.      Angus Seller, PA-C 04/20/12 2118

## 2012-04-20 NOTE — ED Notes (Signed)
Pt c/o bilat flank pain as well as severe tesicle pain. Pt seen by Urology, confirms stones as well as epiditimitis and hydrocele. Pt unable to sit in triage.

## 2012-04-20 NOTE — ED Provider Notes (Signed)
Medical screening examination/treatment/procedure(s) were performed by non-physician practitioner and as supervising physician I was immediately available for consultation/collaboration.  Gilda Crease, MD 04/20/12 2139

## 2012-04-20 NOTE — ED Notes (Signed)
Urologist did ultrasound last week and found hydrocele on his epididimus.  Pt has extensive history of kidney stones and has had six lithotripsy's in the past four years.

## 2012-04-23 ENCOUNTER — Encounter: Payer: Self-pay | Admitting: Family Medicine

## 2012-04-23 ENCOUNTER — Ambulatory Visit: Payer: BC Managed Care – PPO | Admitting: Internal Medicine

## 2012-04-26 ENCOUNTER — Encounter: Payer: Self-pay | Admitting: Internal Medicine

## 2012-05-01 ENCOUNTER — Encounter: Payer: Self-pay | Admitting: Internal Medicine

## 2012-05-01 ENCOUNTER — Ambulatory Visit (INDEPENDENT_AMBULATORY_CARE_PROVIDER_SITE_OTHER): Payer: BC Managed Care – PPO | Admitting: Internal Medicine

## 2012-05-01 ENCOUNTER — Ambulatory Visit (INDEPENDENT_AMBULATORY_CARE_PROVIDER_SITE_OTHER): Payer: BC Managed Care – PPO | Admitting: Family Medicine

## 2012-05-01 VITALS — BP 120/82 | HR 69 | Ht 72.0 in | Wt 281.0 lb

## 2012-05-01 VITALS — BP 144/100 | HR 77 | Temp 98.2°F

## 2012-05-01 DIAGNOSIS — R195 Other fecal abnormalities: Secondary | ICD-10-CM

## 2012-05-01 DIAGNOSIS — R1030 Lower abdominal pain, unspecified: Secondary | ICD-10-CM

## 2012-05-01 DIAGNOSIS — K625 Hemorrhage of anus and rectum: Secondary | ICD-10-CM

## 2012-05-01 DIAGNOSIS — R1013 Epigastric pain: Secondary | ICD-10-CM

## 2012-05-01 DIAGNOSIS — R109 Unspecified abdominal pain: Secondary | ICD-10-CM

## 2012-05-01 MED ORDER — ZOLPIDEM TARTRATE 10 MG PO TABS: 10.0000 mg | ORAL_TABLET | Freq: Every day | ORAL | Status: DC

## 2012-05-01 MED ORDER — HYDROCODONE-ACETAMINOPHEN 5-325 MG PO TABS: 1.0000 | ORAL_TABLET | ORAL | Status: DC | PRN

## 2012-05-01 MED ORDER — PANTOPRAZOLE SODIUM 40 MG PO TBEC: 40.0000 mg | DELAYED_RELEASE_TABLET | Freq: Every day | ORAL | Status: DC

## 2012-05-01 MED ORDER — ALIGN PO CAPS: 1.0000 | ORAL_CAPSULE | Freq: Every day | ORAL | Status: DC

## 2012-05-01 NOTE — Progress Notes (Signed)
Patient ID: Casey Caldwell, male   DOB: [birth year], 32 y.o.   MRN: [id number] HPI: Mr. Rybicki is a 32 yo male with PMH of bilateral renal stones, seizure disorder, GERD,  Anxiety and depression, gallbladder disease status post cholecystectomy who seen in consultation at the request of Dr. Para March for evaluation of epigastric abdominal pain and right upper quadrant abdominal pain. The patient is alone today.  The patient reports epigastric abdominal pain initially starting across the upper abdomen, but now localizing more to the epigastrium and right upper quadrant for the last 3 months. This pain was most noticeable after lithotripsy treatment for renal stones in January.  He reports this pain initially was severe, but now is somewhat better but still present on most days. It is not always related to being, but at times eating makes it worse. He does have some associated nausea but no vomiting. He has intermittently taken omeprazole in the past for heartburn. This does seem to help his heartburn symptoms but did not significantly affect his epigastric pain. He also uses TUMS intermittently. He denies dysphagia or done aphagic. He does report abdominal bloating and feeling "gassy" both with frequent belching and passing lower gas.  He reports a long-standing loose stools, most significant postprandially. He relates this to IBS. He does however report intermittent rectal bleeding. He reports this is as bright red blood. He reports a history of hemorrhoids, but they are not necessarily painful. He has used Preparation H in the past. He does use ibuprofen frequently, at least every other day. He also continues to deal with back pain as well as testicular pain. He reports his urologist in [facility] is planning to remove his left epididymis and repair of a hydrocele in May of 2014. He does continue to use hydrocodone for his testicular pain. At present his kidney stone treatment is on hold until his next urologic  surgery  Past Medical History  Diagnosis Date  . GERD (gastroesophageal reflux disease)   . History of kidney stones   . Seizures Childhood  . Depression     and panic/anxiety  . Insomnia   . Kidney stones     Past Surgical History  Procedure Laterality Date  . Lithotripsy      four times over the years  . Appendectomy    . Cholecystectomy    . Tonsillectomy      Current Outpatient Prescriptions  Medication Sig Dispense Refill  . HYDROcodone-acetaminophen (NORCO) 5-325 MG per tablet Take 1-2 tablets by mouth every 4 (four) hours as needed for pain.  20 tablet  0  . ibuprofen (ADVIL,MOTRIN) 200 MG tablet Take 400 mg by mouth every 6 (six) hours as needed for pain. For pain      . zolpidem (AMBIEN) 10 MG tablet Take 10 mg by mouth at bedtime.      . bifidobacterium infantis (ALIGN) capsule Take 1 capsule by mouth daily.  14 capsule  0  . pantoprazole (PROTONIX) 40 MG tablet Take 1 tablet (40 mg total) by mouth daily.  90 tablet  3   No current facility-administered medications for this visit.    Allergies  Allergen Reactions  . Oxycodone     Anxious, jittery, lack of effect for pain but tolerated vicodin.     Family History  Problem Relation Age of Onset  . Stroke Mother   . Heart disease Father   . Liver disease Mother   . Irritable bowel syndrome Mother   . Kidney disease  Mother   . Colon polyps Father     History  Substance Use Topics  . Smoking status: Never Smoker   . Smokeless tobacco: Never Used  . Alcohol Use: Yes     Comment: rarely    ROS: As per history of present illness, otherwise negative  BP 120/82  Pulse 69  Ht 6' (1.829 m)  Wt 281 lb (127.461 kg)  BMI 38.1 kg/m2 Constitutional: Well-developed and well-nourished. No distress. HEENT: Normocephalic and atraumatic. Oropharynx is clear and moist. No oropharyngeal exudate. Conjunctivae are normal.  No scleral icterus. Cardiovascular: Normal rate, regular rhythm and intact distal pulses. No  M/R/G Pulmonary/chest: Effort normal and breath sounds normal. No wheezing, rales or rhonchi. Abdominal: Soft, epigastric and lower abdominal tenderness without rebound or guarding, nondistended. Bowel sounds active throughout.  Extremities: no clubbing, cyanosis, or edema Lymphadenopathy: No cervical adenopathy noted. Neurological: Alert and oriented to person place and time. Skin: Skin is warm and dry. No rashes noted. Psychiatric: Normal mood and affect. Behavior is normal.  RELEVANT LABS AND IMAGING: CBC    Component Value Date/Time   WBC 7.7 03/25/2012 2215   RBC 4.98 03/25/2012 2215   HGB 16.0 04/20/2012 2033   HCT 47.0 04/20/2012 2033   PLT 315 03/25/2012 2215   MCV 85.3 03/25/2012 2215   MCH 30.9 03/25/2012 2215   MCHC 36.2* 03/25/2012 2215   RDW 13.1 03/25/2012 2215   LYMPHSABS 3.0 03/25/2012 2215   MONOABS 0.9 03/25/2012 2215   EOSABS 0.2 03/25/2012 2215   BASOSABS 0.0 03/25/2012 2215    CMP     Component Value Date/Time   NA 142 04/20/2012 2033   K 3.5 04/20/2012 2033   CL 107 04/20/2012 2033   CO2 26 04/10/2012 2135   GLUCOSE 114* 04/20/2012 2033   BUN 10 04/20/2012 2033   CREATININE 0.80 04/20/2012 2033   CREATININE 0.72 03/13/2012   CALCIUM 9.9 04/10/2012 2135   PROT 6.7 03/25/2012 2215   ALBUMIN 4.0 03/25/2012 2215   AST 19 03/25/2012 2215   AST 17 03/16/2012   ALT 22 03/25/2012 2215   ALKPHOS 61 03/25/2012 2215   BILITOT 0.4 03/25/2012 2215   GFRNONAA >90 04/10/2012 2135   GFRAA >90 04/10/2012 2135   CT ABDOMEN AND PELVIS WITHOUT CONTRAST -  Jan 2014   Technique:  Multidetector CT imaging of the abdomen and pelvis was performed following the standard protocol without intravenous contrast.   Comparison: 06/30/2011.  Ultrasound 01/26/2012.   Findings: Lung bases are clear.  No effusions.  Heart is normal size.   Prior cholecystectomy.  Liver, spleen, pancreas, adrenals have an unremarkable unenhanced appearance.   There is a 9 mm stone within the left renal pelvis.  Multiple smaller  bilateral renal calculi.  No ureteral stones or hydronephrosis.  Urinary bladder decompressed.  Calcified phleboliths in the anatomic pelvis.   Stomach, large and small bowel are unremarkable.  Appendix is visualized and is normal.  No free fluid, free air or adenopathy.   No acute bony abnormality.   IMPRESSION: Bilateral nephrolithiasis.  The largest is a 9 mm stone in the left renal pelvis.  No hydronephrosis or ureteral stones.   ASSESSMENT/PLAN:  32 yo male with PMH of bilateral renal stones, seizure disorder, GERD,  Anxiety and depression, gallbladder disease status post cholecystectomy who seen in consultation at the request of Dr. Para March for evaluation of epigastric abdominal pain and right upper quadrant abdominal pain.  1.  Epigastric pain/nausea -- given his ongoing issues  with epigastric pain also associated with nausea and incomplete response to Prilosec 20 mg daily, I recommend upper endoscopy. He is at risk for peptic ulcer disease given his frequent NSAID use. We discussed this test today including the risks and benefits and he is agreeable to proceed. I would like to start him on a more potent PPI, and I prescribed pantoprazole 40 mg daily. Her the recommendations after endoscopy.  2.  IBS and rectal bleeding -- he does have several symptoms consistent with irritable bowel which include bloating and postprandial loose stools. Rectal bleeding is not consistent with irritable bowel and I recommended flexible sigmoidoscopy to be performed on the same day as his upper endoscopy to exclude colitis or other rectal pathology. We discussed this test including the risks and benefits and he is agreeable to proceed. I will start him on Align one capsule daily as a probiotic which hopefully will help regulate his bowel habits and decrease some of his gas and bloating. EGD will also help to exclude celiac disease as a cause of his abdominal symptoms as described above.  Further  recommendations after procedures.

## 2012-05-01 NOTE — Patient Instructions (Signed)
Take the pain meds as needed, averaging 3 per day.  If the pain isn't alleviated with urology's help, we may need to get a pain clinic involved.  Take care.  Glad to see you.

## 2012-05-01 NOTE — Patient Instructions (Addendum)
You have been scheduled for an endoscopy/Flex sig with propofol. Please follow written instructions given to you at your visit today. If you use inhalers (even only as needed), please bring them with you on the day of your procedure.   We have sent the following medications to your pharmacy for you to pick up at your convenience: pantoprazole, align                                               We are excited to introduce MyChart, a new best-in-class service that provides you online access to important information in your electronic medical record. We want to make it easier for you to view your health information - all in one secure location - when and where you need it. We expect MyChart will enhance the quality of care and service we provide.  When you register for MyChart, you can:    View your test results.    Request appointments and receive appointment reminders via email.    Request medication renewals.    View your medical history, allergies, medications and immunizations.    Communicate with your physician's office through a password-protected site.    Conveniently print information such as your medication lists.  To find out if MyChart is right for you, please talk to a member of our clinical staff today. We will gladly answer your questions about this free health and wellness tool.  If you are age 34 or older and want a member of your family to have access to your record, you must provide written consent by completing a proxy form available at our office. Please speak to our clinical staff about guidelines regarding accounts for patients younger than age 20.  As you activate your MyChart account and need any technical assistance, please call the MyChart technical support line at [phone]) or email your question to [email or web address]. If you email your question(s), please include your name, a return phone number and the best time to reach you.  If  you have non-urgent health-related questions, you can send a message to our office through MyChart at [email or web address]. If you have a medical emergency, call 911.  Thank you for using MyChart as your new health and wellness resource!   MyChart licensed from [employer],  [id number]. Patents Pending.

## 2012-05-02 NOTE — Assessment & Plan Note (Signed)
dw pt about options.  He'll f/u with uro for the hydrocele and excision of L epidymidis.   Will use vicodin for pelvic pain in meantime. No illicits. If not improved with uro intervention, we'll need pain clinic help. He agrees.

## 2012-05-02 NOTE — Progress Notes (Signed)
Has had f/u with neuro and sleep study is pending.   Saw uro and L hydrocele noted.  Plan for removal of L epididymis.  This may alleviate his pain. Asking about options in the meantime. No illicits.  No diversion.  Known renal stones.   He has seen GI and has EGD and flex sig pending.   Meds, vitals, and allergies reviewed.   ROS: See HPI.  Otherwise, noncontributory.  nad but uncomfortable from pelvic pain

## 2012-05-21 ENCOUNTER — Telehealth: Payer: Self-pay

## 2012-05-21 MED ORDER — HYDROCODONE-ACETAMINOPHEN 5-325 MG PO TABS: 1.0000 | ORAL_TABLET | ORAL | Status: DC | PRN

## 2012-05-21 NOTE — Telephone Encounter (Signed)
Then continue with 4 a day for now and I'll await his status after surgery.  Please call in rx if needed. Thanks.

## 2012-05-21 NOTE — Telephone Encounter (Signed)
Pt left v/m; pt was seen 05/01/12; pt surgery has been changed  due to scheduling issues to 06/13/12.pt taking vicodin 4 times daily due to pain.Pt wanted Dr Para March to know pain is worse than when seen.Please advise.

## 2012-05-22 NOTE — Telephone Encounter (Signed)
LMOVM

## 2012-05-25 NOTE — Telephone Encounter (Signed)
Please give the okay to fill- the sig was changed as he was needing up to 4 a day.  Thanks.

## 2012-05-25 NOTE — Telephone Encounter (Signed)
Patient advised.

## 2012-05-25 NOTE — Telephone Encounter (Signed)
Pt left v/m requesting status of refill for hydrocodone apap; spoke with Jill Alexanders pharmacist at [facility] on [address] to call in hydrocodone apap from 05/21/12 note. Jill Alexanders said prescription is not due until 05/31/12. Please advise.

## 2012-05-25 NOTE — Telephone Encounter (Signed)
Rx phoned to pharmacy.  

## 2012-05-31 ENCOUNTER — Ambulatory Visit (AMBULATORY_SURGERY_CENTER): Payer: BC Managed Care – PPO | Admitting: Internal Medicine

## 2012-05-31 ENCOUNTER — Encounter: Payer: Self-pay | Admitting: Internal Medicine

## 2012-05-31 VITALS — BP 110/70 | HR 53 | Temp 97.6°F | Resp 19 | Ht 72.0 in | Wt 281.0 lb

## 2012-05-31 DIAGNOSIS — D1330 Benign neoplasm of unspecified part of small intestine: Secondary | ICD-10-CM

## 2012-05-31 DIAGNOSIS — R110 Nausea: Secondary | ICD-10-CM

## 2012-05-31 DIAGNOSIS — K2970 Gastritis, unspecified, without bleeding: Secondary | ICD-10-CM

## 2012-05-31 DIAGNOSIS — K625 Hemorrhage of anus and rectum: Secondary | ICD-10-CM

## 2012-05-31 DIAGNOSIS — K2990 Gastroduodenitis, unspecified, without bleeding: Secondary | ICD-10-CM

## 2012-05-31 DIAGNOSIS — K6389 Other specified diseases of intestine: Secondary | ICD-10-CM

## 2012-05-31 DIAGNOSIS — K639 Disease of intestine, unspecified: Secondary | ICD-10-CM

## 2012-05-31 DIAGNOSIS — K635 Polyp of colon: Secondary | ICD-10-CM

## 2012-05-31 DIAGNOSIS — K602 Anal fissure, unspecified: Secondary | ICD-10-CM

## 2012-05-31 DIAGNOSIS — D126 Benign neoplasm of colon, unspecified: Secondary | ICD-10-CM

## 2012-05-31 DIAGNOSIS — R195 Other fecal abnormalities: Secondary | ICD-10-CM

## 2012-05-31 DIAGNOSIS — R1013 Epigastric pain: Secondary | ICD-10-CM

## 2012-05-31 MED ORDER — DILTIAZEM GEL 2 %: 1.0000 "application " | Freq: Two times a day (BID) | CUTANEOUS | Status: DC

## 2012-05-31 MED ORDER — PANTOPRAZOLE SODIUM 40 MG PO TBEC: 40.0000 mg | DELAYED_RELEASE_TABLET | Freq: Two times a day (BID) | ORAL | Status: DC

## 2012-05-31 MED ORDER — SODIUM CHLORIDE 0.9 % IV SOLN: 500.0000 mL | INTRAVENOUS | Status: DC

## 2012-05-31 NOTE — Progress Notes (Signed)
Diltiazem gel was called into [facility] pharmacy with 2 refills, [facility]ix [facility] per pt request

## 2012-05-31 NOTE — Progress Notes (Signed)
Called to room to assist during endoscopic procedure.  Patient ID and intended procedure confirmed with present staff. Received instructions for my participation in the procedure from the performing physician.  

## 2012-05-31 NOTE — Progress Notes (Signed)
Patient did not experience any of the following events: a burn prior to discharge; a fall within the facility; wrong site/side/patient/procedure/implant event; or a hospital transfer or hospital admission upon discharge from the facility. (G8907) Patient did not have preoperative order for IV antibiotic SSI prophylaxis. (G8918)  

## 2012-05-31 NOTE — Progress Notes (Signed)
Report to pacu rn, vss, bbs=clear 

## 2012-05-31 NOTE — Op Note (Signed)
[facility] [address] [place], [address]   ENDOSCOPY PROCEDURE REPORT  PATIENT: Casey Caldwell, Maberry  MR#: [id number] BIRTHDATE: July 28, 1980 , 32  yrs. old GENDER: Male ENDOSCOPIST: Beverley Fiedler, MD REFERRED BY:  Crawford Givens PROCEDURE DATE:  05/31/2012 PROCEDURE:  EGD w/ biopsy ASA CLASS:     Class II INDICATIONS:  Epigastric pain.   Nausea. MEDICATIONS: MAC sedation, administered by CRNA and propofol (Diprivan) 150mg  IV TOPICAL ANESTHETIC: Cetacaine Spray  DESCRIPTION OF PROCEDURE: After the risks benefits and alternatives of the procedure were thoroughly explained, informed consent was obtained.  The [facility] [id number] endoscope was introduced through the mouth and advanced to the second portion of the duodenum. Without limitations.  The instrument was slowly withdrawn as the mucosa was fully examined.   ESOPHAGUS: The mucosa of the esophagus appeared normal.   A normal Z-line was observed 40 cm from the incisors.  STOMACH: There was mild antral gastropathy noted.  Cold forcep biopsies were taken at the antrum and angularis.   The stomach otherwise appeared normal.  DUODENUM: The duodenal mucosa showed no abnormalities in the bulb and second portion of the duodenum.  Cold forceps biopsies were taken in the bulb and second portion.  Retroflexed views revealed no abnormalities.     The scope was then withdrawn from the patient and the procedure completed.  COMPLICATIONS: There were no complications. ENDOSCOPIC IMPRESSION: 1.   The mucosa of the esophagus appeared normal 2.   Normal Z-line was observed 40 cm from the incisors 3.   There was mild antral gastropathy noted; biopsies taken 4.   The stomach otherwise appeared normal 5.   The duodenal mucosa showed no abnormalities in the bulb and second portion of the duodenum; biopsies taken  RECOMMENDATIONS: 1.  Await pathology results 2.  Follow-up of helicobacter pylori status, treat if  indicated 3.  Given response, can continue pantoprazole 40 mg daily (30 min to 1 hour before first meal of the day).  An evening dose, 30 minutes before your last meal, can be added if symptoms warrant.  eSigned:  Beverley Fiedler, MD 05/31/2012 2:05 PM            CC:The Patient

## 2012-05-31 NOTE — Op Note (Signed)
[facility] [address] [place], [address]   FLEXIBLE SIGMOIDOSCOPY PROCEDURE REPORT  PATIENT: Casey Caldwell, Casey Caldwell  MR#: [id number] BIRTHDATE: [birth year] , 32  yrs. old GENDER: Male ENDOSCOPIST: Beverley Fiedler, MD REFERRED BY: Crawford Givens, M.D. PROCEDURE DATE:  05/31/2012 PROCEDURE:   Sigmoidoscopy with snare and Sigmoidoscopy with biopsy  ASA CLASS:   Class II INDICATIONS:rectal bleeding. MEDICATIONS: MAC sedation, administered by CRNA, Propofol (Diprivan), and Propofol (Diprivan) 110 mg IV  DESCRIPTION OF PROCEDURE:   After the risks benefits and alternatives of the procedure were thoroughly explained, informed consent was obtained.  revealed an anal fissure. The [id number]  endoscope was introduced through the anus  and advanced to the transverse colon , limited by No adverse events experienced. The quality of the prep was good .  The instrument was then slowly withdrawn as the mucosa was fully examined.    COLON FINDINGS: A sessile polyp measuring 5 mm in size was found in the sigmoid colon.  A polypectomy was performed with a cold snare. The resection was complete and the polyp tissue was completely retrieved.  An area of mild, patchy erythema with scattered erosion was found in the distal sigmoid and rectum.  Multiple biopsies were performed using cold forceps to exclude chronic inflammation, though query NSAID related erythema.    Medium sized anal fissure was found in the anal canal. Retroflexed views revealed no abnormalities.    The scope was then withdrawn from the patient and the procedure terminated.  COMPLICATIONS: There were no complications.  ENDOSCOPIC IMPRESSION: 1.   Sessile polyp measuring 5 mm in size was found in the sigmoid colon; polypectomy was performed with a cold snare 2.   Patchy erythema and erosion in the distal sigmoid and rectum; multiple biopsies were performed using cold forceps 3.   Anal fissure in the anal  canal, felt to be source of rectal bleeding  RECOMMENDATIONS: 1.  Await biopsy results 2.  Avoid NSAIDs when possible 3.  Trial of topical diltiazem gel twice daily for anal fissures 4.  If polyp removed today is adenomatous, then full colonoscopy will recommended [ eSigned:  Beverley Fiedler, MD 05/31/2012 2:16 PM   CC:The Patient

## 2012-05-31 NOTE — Patient Instructions (Addendum)
YOU HAD AN ENDOSCOPIC PROCEDURE TODAY AT THE [facility]: Refer to the procedure report that was given to you for any specific questions about what was found during the examination.  If the procedure report does not answer your questions, please call your gastroenterologist to clarify.  If you requested that your care partner not be given the details of your procedure findings, then the procedure report has been included in a sealed envelope for you to review at your convenience later.  YOU SHOULD EXPECT: Some feelings of bloating in the abdomen. Passage of more gas than usual.  Walking can help get rid of the air that was put into your GI tract during the procedure and reduce the bloating. If you had a lower endoscopy (such as a colonoscopy or flexible sigmoidoscopy) you may notice spotting of blood in your stool or on the toilet paper. If you underwent a bowel prep for your procedure, then you may not have a normal bowel movement for a few days.  DIET: Your first meal following the procedure should be a light meal and then it is ok to progress to your normal diet.  A half-sandwich or bowl of soup is an example of a good first meal.  Heavy or fried foods are harder to digest and may make you feel nauseous or bloated.  Likewise meals heavy in dairy and vegetables can cause extra gas to form and this can also increase the bloating.  Drink plenty of fluids but you should avoid alcoholic beverages for 24 hours.  ACTIVITY: Your care partner should take you home directly after the procedure.  You should plan to take it easy, moving slowly for the rest of the day.  You can resume normal activity the day after the procedure however you should NOT DRIVE or use heavy machinery for 24 hours (because of the sedation medicines used during the test).    SYMPTOMS TO REPORT IMMEDIATELY: A gastroenterologist can be reached at any hour.  During normal business hours, 8:30 AM to 5:00 PM Monday through Friday,  call [phone].  After hours and on weekends, please call the GI answering service at [phone] who will take a message and have the physician on call contact you.   Following lower endoscopy (colonoscopy or flexible sigmoidoscopy):  Excessive amounts of blood in the stool  Significant tenderness or worsening of abdominal pains  Swelling of the abdomen that is new, acute  Fever of 100F or higher  Following upper endoscopy (EGD)  Vomiting of blood or coffee ground material  New chest pain or pain under the shoulder blades  Painful or persistently difficult swallowing  New shortness of breath  Fever of 100F or higher  Black, tarry-looking stools  FOLLOW UP: If any biopsies were taken you will be contacted by phone or by letter within the next 1-3 weeks.  Call your gastroenterologist if you have not heard about the biopsies in 3 weeks.  Our staff will call the home number listed on your records the next business day following your procedure to check on you and address any questions or concerns that you may have at that time regarding the information given to you following your procedure. This is a courtesy call and so if there is no answer at the home number and we have not heard from you through the emergency physician on call, we will assume that you have returned to your regular daily activities without incident.  SIGNATURES/CONFIDENTIALITY: You and/or your care   partner have signed paperwork which will be entered into your electronic medical record.  These signatures attest to the fact that that the information above on your After Visit Summary has been reviewed and is understood.  Full responsibility of the confidentiality of this discharge information lies with you and/or your care-partner.  Gastritis, polyp-handouts given  Wait for biopsy results  Continue pantoprazole 40 mg daily, 30 mins to 1 hr before first meal of the day Evening dose 30 mins before your last meal-can  be added if symptoms warrant  Avoid NSAIDS (ibuprofen, motrin, advil, naproxen, etc.)  Topical diltiazem gel twice a day for anal fissure  Repeat colonoscopy will be determined by pathology

## 2012-06-01 ENCOUNTER — Telehealth: Payer: Self-pay | Admitting: *Deleted

## 2012-06-01 NOTE — Telephone Encounter (Signed)
Number identifier, left message, follow-up  

## 2012-06-06 ENCOUNTER — Ambulatory Visit: Payer: Self-pay | Admitting: Neurology

## 2012-06-06 ENCOUNTER — Encounter: Payer: Self-pay | Admitting: Internal Medicine

## 2012-06-06 ENCOUNTER — Telehealth: Payer: Self-pay | Admitting: Gastroenterology

## 2012-06-06 DIAGNOSIS — D126 Benign neoplasm of colon, unspecified: Secondary | ICD-10-CM | POA: Insufficient documentation

## 2012-06-06 NOTE — Telephone Encounter (Signed)
Spoke to pt. He said he was not at home and will check his calender when he gets home and call us back tomorrow.

## 2012-06-24 HISTORY — PX: HYDROCELE EXCISION: SHX482

## 2012-06-26 ENCOUNTER — Other Ambulatory Visit: Payer: Self-pay

## 2012-06-26 NOTE — Telephone Encounter (Signed)
Pt left v/m requesting refill for hydrocodone for groin pain with changing dosage to take one tab twice a day to [facility] [address].

## 2012-06-27 MED ORDER — HYDROCODONE-ACETAMINOPHEN 5-325 MG PO TABS: 1.0000 | ORAL_TABLET | Freq: Two times a day (BID) | ORAL | Status: DC | PRN

## 2012-06-27 NOTE — Telephone Encounter (Signed)
Please call in.  I'm hopeful he'll be able to taper off the pain medicine gradually.  I'll need an update in a few weeks, so we can plan on the meds from that point onward.  Thanks.

## 2012-06-27 NOTE — Telephone Encounter (Signed)
rx called into pharmacy

## 2012-07-10 ENCOUNTER — Emergency Department (HOSPITAL_COMMUNITY): Payer: BC Managed Care – PPO

## 2012-07-10 ENCOUNTER — Emergency Department (HOSPITAL_COMMUNITY)
Admission: EM | Admit: 2012-07-10 | Discharge: 2012-07-10 | Disposition: A | Discharge status: Home / Self Care | Payer: BC Managed Care – PPO | Attending: Emergency Medicine | Admitting: Emergency Medicine

## 2012-07-10 ENCOUNTER — Encounter (HOSPITAL_COMMUNITY): Payer: Self-pay | Admitting: *Deleted

## 2012-07-10 DIAGNOSIS — M549 Dorsalgia, unspecified: Secondary | ICD-10-CM | POA: Insufficient documentation

## 2012-07-10 DIAGNOSIS — Z8659 Personal history of other mental and behavioral disorders: Secondary | ICD-10-CM | POA: Insufficient documentation

## 2012-07-10 DIAGNOSIS — N200 Calculus of kidney: Secondary | ICD-10-CM | POA: Insufficient documentation

## 2012-07-10 DIAGNOSIS — R110 Nausea: Secondary | ICD-10-CM | POA: Insufficient documentation

## 2012-07-10 DIAGNOSIS — G4700 Insomnia, unspecified: Secondary | ICD-10-CM | POA: Insufficient documentation

## 2012-07-10 DIAGNOSIS — Z79899 Other long term (current) drug therapy: Secondary | ICD-10-CM | POA: Insufficient documentation

## 2012-07-10 DIAGNOSIS — K219 Gastro-esophageal reflux disease without esophagitis: Secondary | ICD-10-CM | POA: Insufficient documentation

## 2012-07-10 DIAGNOSIS — Z9889 Other specified postprocedural states: Secondary | ICD-10-CM | POA: Insufficient documentation

## 2012-07-10 DIAGNOSIS — Z8669 Personal history of other diseases of the nervous system and sense organs: Secondary | ICD-10-CM | POA: Insufficient documentation

## 2012-07-10 LAB — COMPREHENSIVE METABOLIC PANEL
ALT: 30 U/L (ref 0–53)
AST: 24 U/L (ref 0–37)
Albumin: 4.3 g/dL (ref 3.5–5.2)
Alkaline Phosphatase: 81 U/L (ref 39–117)
BUN: 11 mg/dL (ref 6–23)
CO2: 29 mEq/L (ref 19–32)
Calcium: 10.1 mg/dL (ref 8.4–10.5)
Chloride: 104 mEq/L (ref 96–112)
Creatinine, Ser: 0.82 mg/dL (ref 0.50–1.35)
GFR calc Af Amer: >90 mL/min (ref >90)
GFR calc non Af Amer: >90 mL/min (ref >90)
Glucose, Bld: 93 mg/dL (ref 70–99)
Potassium: 4.2 mEq/L (ref 3.5–5.1)
Sodium: 142 mEq/L (ref 135–145)
Total Bilirubin: 0.3 mg/dL (ref 0.3–1.2)
Total Protein: 7.3 g/dL (ref 6.0–8.3)

## 2012-07-10 LAB — URINALYSIS, ROUTINE W REFLEX MICROSCOPIC
Bilirubin Urine: NEGATIVE
Glucose, UA: NEGATIVE mg/dL
Hgb urine dipstick: NEGATIVE
Ketones, ur: NEGATIVE mg/dL
Leukocytes, UA: NEGATIVE
Nitrite: NEGATIVE
Protein, ur: NEGATIVE mg/dL
Specific Gravity, Urine: 1.027 (ref 1.005–1.030)
Urobilinogen, UA: 0.2 mg/dL (ref 0.0–1.0)
pH: 5.5 (ref 5.0–8.0)

## 2012-07-10 LAB — CBC WITH DIFFERENTIAL/PLATELET
Basophils Absolute: 0.1 10*3/uL (ref 0.0–0.1)
Basophils Relative: 1 % (ref 0–1)
Eosinophils Absolute: 0.5 10*3/uL (ref 0.0–0.7)
Eosinophils Relative: 6 % — ABNORMAL HIGH (ref 0–5)
HCT: 44.5 % (ref 39.0–52.0)
Hemoglobin: 16.3 g/dL (ref 13.0–17.0)
Lymphocytes Relative: 31 % (ref 12–46)
Lymphs Abs: 2.9 10*3/uL (ref 0.7–4.0)
MCH: 30.8 pg (ref 26.0–34.0)
MCHC: 36.6 g/dL — ABNORMAL HIGH (ref 30.0–36.0)
MCV: 84.1 fL (ref 78.0–100.0)
Monocytes Absolute: 0.8 10*3/uL (ref 0.1–1.0)
Monocytes Relative: 8 % (ref 3–12)
Neutro Abs: 5.2 10*3/uL (ref 1.7–7.7)
Neutrophils Relative %: 55 % (ref 43–77)
Platelets: 356 10*3/uL (ref 150–400)
RBC: 5.29 MIL/uL (ref 4.22–5.81)
RDW: 13.1 % (ref 11.5–15.5)
WBC: 9.4 10*3/uL (ref 4.0–10.5)

## 2012-07-10 MED ORDER — ONDANSETRON HCL 4 MG/2ML IJ SOLN
4.0000 mg | Freq: Once | INTRAMUSCULAR | Status: AC
  Administered 2012-07-10: 4 mg via INTRAVENOUS
  Filled 2012-07-10: qty 2

## 2012-07-10 MED ORDER — HYDROMORPHONE HCL PF 1 MG/ML IJ SOLN
1.0000 mg | Freq: Once | INTRAMUSCULAR | Status: AC
  Administered 2012-07-10: 1 mg via INTRAVENOUS
  Filled 2012-07-10: qty 1

## 2012-07-10 MED ORDER — HYDROMORPHONE HCL 2 MG PO TABS: 2.0000 mg | ORAL_TABLET | Freq: Four times a day (QID) | ORAL | Status: DC | PRN

## 2012-07-10 MED ORDER — KETOROLAC TROMETHAMINE 30 MG/ML IJ SOLN
30.0000 mg | Freq: Once | INTRAMUSCULAR | Status: AC
  Administered 2012-07-10: 30 mg via INTRAVENOUS
  Filled 2012-07-10: qty 1

## 2012-07-10 NOTE — ED Provider Notes (Signed)
Medical screening examination/treatment/procedure(s) were performed by non-physician practitioner and as supervising physician I was immediately available for consultation/collaboration.  Toy Baker, MD 07/10/12 2256

## 2012-07-10 NOTE — ED Notes (Signed)
Pt reports having epidydimitis removed x 4 weeks, has hx of kidney stones in the past.  Pt reports R side back pain and L groin pain x 2 days.  Pt also reports urinary retention.  Pt also reports nausea with the pain but denies any vomiting.

## 2012-07-10 NOTE — ED Notes (Signed)
US at bedside

## 2012-07-10 NOTE — ED Provider Notes (Signed)
History     CSN: [id number]  Arrival date & time 07/10/12  1631   First MD Initiated Contact with Patient 07/10/12 1748      Chief Complaint  Patient presents with  . Groin Pain  . Back Pain    (Consider location/radiation/quality/duration/timing/severity/associated sxs/prior treatment) HPI Comments: 32 y.o. With PMHx kidney stones (lithotripsy Jan 2014, bilateral kidneys, 11 mm; April 2013 right kidney, passed stones about 4 times previous) presents today complaining of groin pain (top of the testicle vs bottom, sharp pains vs dull of the surgery), right sided flank plan that began a couple of days ago. Pt has noticed darker urine, but no blood. Admits nausea, denies vomiting, fevers. Pt is currently taking stool softener due to pain meds use from surgery. Pain described as constant, dull, achy, and intermittent sharp pains. Pt has taken his prescribed Vicodin with no relief. Pt states pain is 8/10, radiating from right flank to groin.   Pt urologist in [facility] Dr. Gerlene Burdock Puschinsky [phone]). Pt has appt for f-up for Friday.   Patient is a 32 y.o. male presenting with groin pain and back pain.  Groin Pain Pertinent negatives include no chest pain, diaphoresis, fever, headaches, nausea, neck pain, numbness, rash, vomiting or weakness.  Back Pain Associated symptoms: no chest pain, no dysuria, no fever, no headaches, no numbness and no weakness     Past Medical History  Diagnosis Date  . GERD (gastroesophageal reflux disease)   . History of kidney stones   . Seizures Childhood  . Depression     and panic/anxiety  . Insomnia   . Kidney stones     Past Surgical History  Procedure Laterality Date  . Lithotripsy      four times over the years  . Appendectomy    . Cholecystectomy    . Tonsillectomy    . Wrist ganglion excision      Right    Family History  Problem Relation Age of Onset  . Stroke Mother   . Heart disease Father   . Liver disease Mother   .  Irritable bowel syndrome Mother   . Kidney disease Mother   . Colon polyps Father     History  Substance Use Topics  . Smoking status: Never Smoker   . Smokeless tobacco: Never Used  . Alcohol Use: Yes     Comment: rarely      Review of Systems  Constitutional: Negative for fever and diaphoresis.  HENT: Negative for neck pain and neck stiffness.   Eyes: Negative for visual disturbance.  Respiratory: Negative for apnea, chest tightness and shortness of breath.   Cardiovascular: Negative for chest pain and palpitations.  Gastrointestinal: Negative for nausea, vomiting, diarrhea and constipation.  Genitourinary: Negative for dysuria.  Musculoskeletal: Positive for back pain. Negative for gait problem.  Skin: Negative for rash.  Neurological: Negative for dizziness, weakness, light-headedness, numbness and headaches.    Allergies  Oxycodone  Home Medications   Current Outpatient Rx  Name  Route  Sig  Dispense  Refill  . diltiazem 2 % GEL   Topical   Apply 1 application topically 2 (two) times daily.   30 g   2   . docusate sodium (COLACE) 100 MG capsule   Oral   Take 100 mg by mouth 2 (two) times daily.         Marland Kitchen HYDROcodone-acetaminophen (NORCO/VICODIN) 5-325 MG per tablet   Oral   Take 1 tablet by mouth every 8 (eight)  hours as needed for pain.         Marland Kitchen levofloxacin (LEVAQUIN) 500 MG tablet   Oral   Take 500 mg by mouth daily. 10 day course of therapy started 07/03/12.         . pantoprazole (PROTONIX) 40 MG tablet   Oral   Take 1 tablet (40 mg total) by mouth 2 (two) times daily.   60 tablet   3   . promethazine (PHENERGAN) 25 MG tablet   Oral   Take 25 mg by mouth every 6 (six) hours as needed for nausea.         Marland Kitchen zolpidem (AMBIEN) 10 MG tablet   Oral   Take 1 tablet (10 mg total) by mouth at bedtime.   30 tablet   1     There were no vitals taken for this visit.  Physical Exam  Nursing note and vitals reviewed. Constitutional: He is  oriented to person, place, and time. He appears well-developed and well-nourished. No distress.  HENT:  Head: Normocephalic and atraumatic.  Eyes: Conjunctivae and EOM are normal.  Neck: Normal range of motion. Neck supple.  No meningeal signs  Cardiovascular: Normal rate, regular rhythm and normal heart sounds.  Exam reveals no gallop and no friction rub.   No murmur heard. Pulmonary/Chest: Effort normal and breath sounds normal. No respiratory distress. He has no wheezes. He has no rales. He exhibits no tenderness.  Abdominal: Soft. Bowel sounds are normal. He exhibits no distension. There is no tenderness. There is CVA tenderness. There is no rebound and no guarding.  Right sided flank   Genitourinary:  Left sided groin pain. No infection noted at surgical site on dorsum of penis. Chaperone present.   Musculoskeletal: Normal range of motion. He exhibits no edema and no tenderness.  Neurological: He is alert and oriented to person, place, and time. No cranial nerve deficit.  Skin: Skin is warm and dry. He is not diaphoretic. No erythema.       ED Course  Procedures (including critical care time)  Labs Reviewed  CBC WITH DIFFERENTIAL - Abnormal; Notable for the following:    MCHC 36.6 (*)    Eosinophils Relative 6 (*)    All other components within normal limits  COMPREHENSIVE METABOLIC PANEL  URINALYSIS, ROUTINE W REFLEX MICROSCOPIC   US Renal  07/10/2012   *RADIOLOGY REPORT*  Clinical Data: Right flank pain.  RENAL/URINARY TRACT ULTRASOUND COMPLETE  Comparison:  CT 02/04/2012 and ultrasound 01/26/2012  Findings:  Right Kidney:  Right kidney measures 11.7 cm in length.  There is normal renal echotexture.  Negative for hydronephrosis.  There is a 6 mm echogenic structure in the lateral right kidney which could represent a nonobstructive stone.  Left Kidney:  Left kidney measures 13.1 cm in length without hydronephrosis.  Renal echotexture is grossly normal.  4 mm echogenic structure  in the lower left kidney is suggestive for a stone.  Bladder:  Fluid in the urinary bladder.  The prevoid volume is 104 ml.  IMPRESSION: Negative for hydronephrosis.  Sub centimeter echogenic focus in each kidney.  Findings could represent nonobstructive kidney stones.   Original Report Authenticated By: Richarda Overlie, M.D.     1. Bilateral kidney stones       MDM  Pt afebrile, not septic appearing. Vitals WNL. Pt has hx of kidney stones and endorses similar pain today. Pt also admits hard to differentiate between pain from his epididymitis pain, but states pain today is  more in the groin than at the surgical site under the penis. Pt has had 2 lithotripsy procedures in the past 2 years for bilateral stones and has passed 4 stones as well. Pt has follow up appt on Fri with urologist for epididymitis, but was concerned for the pain which was not relieved with Vicodin. Pt states Percocet makes him "jittery." Will get basic labs, urinalysis, and renal US and re-evaluate.   US findings show 6mm right kidney stone and 4mm left kidney stone. Discussed pt case with Dr. Freida Busman who agrees with pt plan to discharge with follow up with his urologist. Will d/c with pain meds. Discussed reasons to seek immediate care. Patient expresses understanding and agrees with plan.   Glade Nurse, PA-C 07/10/12 2119

## 2012-07-13 ENCOUNTER — Telehealth: Payer: Self-pay | Admitting: Internal Medicine

## 2012-07-13 MED ORDER — DOCUSATE SODIUM 100 MG PO CAPS: 100.0000 mg | ORAL_CAPSULE | Freq: Two times a day (BID) | ORAL | Status: DC

## 2012-07-13 NOTE — Telephone Encounter (Signed)
Pt reports he gets OTC meds as a script. Ordered [employer]'s stool softener. Also, pt stated he was called, but he is recovering from surgery and will call us back.

## 2012-07-13 NOTE — Telephone Encounter (Signed)
lmom for pt to call back. Dr Rhea Belton and I think this med is a [employer] brand stool softener. Also per path report after flex sig, pt needs a COLON d/t adenomatous polyp.

## 2012-07-18 ENCOUNTER — Encounter: Payer: Self-pay | Admitting: Radiology

## 2012-07-19 ENCOUNTER — Encounter: Payer: Self-pay | Admitting: Family Medicine

## 2012-07-19 ENCOUNTER — Ambulatory Visit (INDEPENDENT_AMBULATORY_CARE_PROVIDER_SITE_OTHER): Payer: BC Managed Care – PPO | Admitting: Family Medicine

## 2012-07-19 VITALS — BP 124/86 | HR 76 | Temp 97.4°F | Wt 291.2 lb

## 2012-07-19 DIAGNOSIS — F419 Anxiety disorder, unspecified: Secondary | ICD-10-CM

## 2012-07-19 DIAGNOSIS — F329 Major depressive disorder, single episode, unspecified: Secondary | ICD-10-CM

## 2012-07-19 DIAGNOSIS — F341 Dysthymic disorder: Secondary | ICD-10-CM

## 2012-07-19 DIAGNOSIS — G4700 Insomnia, unspecified: Secondary | ICD-10-CM

## 2012-07-19 DIAGNOSIS — R1030 Lower abdominal pain, unspecified: Secondary | ICD-10-CM

## 2012-07-19 DIAGNOSIS — G40909 Epilepsy, unspecified, not intractable, without status epilepticus: Secondary | ICD-10-CM

## 2012-07-19 DIAGNOSIS — F32A Depression, unspecified: Secondary | ICD-10-CM

## 2012-07-19 DIAGNOSIS — R109 Unspecified abdominal pain: Secondary | ICD-10-CM

## 2012-07-19 MED ORDER — HYDROCODONE-ACETAMINOPHEN 5-325 MG PO TABS: 1.0000 | ORAL_TABLET | Freq: Three times a day (TID) | ORAL | Status: DC | PRN

## 2012-07-19 MED ORDER — ZOLPIDEM TARTRATE 10 MG PO TABS: 10.0000 mg | ORAL_TABLET | Freq: Every day | ORAL | Status: DC

## 2012-07-19 NOTE — Patient Instructions (Addendum)
Use the pain meds, try to take 3 or less per day.  Ask about a nerve block.  I'll await the uro notes.  We'll go from there.   Take care.

## 2012-07-19 NOTE — Progress Notes (Signed)
Uro f/u had to be delayed, he has f/u scheduled for today.  Still with L sided (superior) testicle pain.  He has a stabbing pain but the prev ache is resolved.  Surgery was ~5 weeks ago.  The swelling on the L testicle slowly improved but still feels harder than the R testicle.    He passed a renal stone recently, still with 2 stones still likely present.    He went back to work this week.  He had less pain med need until he went back to work and had more movement with the stabbing pain.  Vicodin helped some.  No dysuria.  His R abd pain is some better in the interval. He had GI f/u in the interval.   He hasn't had ability to f/u with neuro in the meantime.  This was pushed back.  He's sleeping better with ambien w/o ADE.    Meds, vitals, and allergies reviewed.   ROS: See HPI.  Otherwise, noncontributory.  nad Speech wnl Affect wnl L testicle sore and tender, no hernia note No scrotal skin changes noted.

## 2012-07-20 NOTE — Assessment & Plan Note (Signed)
Continue prn pain meds with uro f/u pending.

## 2012-07-20 NOTE — Assessment & Plan Note (Signed)
Will await uro f/u and see how he is after the pain is addressed.

## 2012-07-20 NOTE — Assessment & Plan Note (Signed)
No ADE on ambien, continue as is.

## 2012-07-20 NOTE — Assessment & Plan Note (Signed)
Neuro fu pending.

## 2012-07-25 ENCOUNTER — Telehealth: Payer: Self-pay | Admitting: Gastroenterology

## 2012-07-25 ENCOUNTER — Other Ambulatory Visit: Payer: Self-pay | Admitting: Gastroenterology

## 2012-07-25 NOTE — Telephone Encounter (Signed)
Sent letter to address we have on file asking pt to call us to schedule colonoscopy with Dr Rhea Belton d/t biopsy results from flex sig.

## 2012-08-02 ENCOUNTER — Ambulatory Visit (INDEPENDENT_AMBULATORY_CARE_PROVIDER_SITE_OTHER): Payer: BC Managed Care – PPO | Admitting: Family Medicine

## 2012-08-02 ENCOUNTER — Encounter: Payer: Self-pay | Admitting: Family Medicine

## 2012-08-02 VITALS — BP 122/80 | HR 81 | Temp 98.0°F | Wt 292.0 lb

## 2012-08-02 DIAGNOSIS — R059 Cough, unspecified: Secondary | ICD-10-CM

## 2012-08-02 DIAGNOSIS — R05 Cough: Secondary | ICD-10-CM

## 2012-08-02 DIAGNOSIS — R21 Rash and other nonspecific skin eruption: Secondary | ICD-10-CM | POA: Insufficient documentation

## 2012-08-02 DIAGNOSIS — L559 Sunburn, unspecified: Secondary | ICD-10-CM

## 2012-08-02 LAB — POCT INFLUENZA A/B

## 2012-08-02 MED ORDER — PREDNISONE 20 MG PO TABS: ORAL_TABLET | ORAL | Status: DC

## 2012-08-02 NOTE — Patient Instructions (Addendum)
Use the prednisone with food, take benadryl or claritin for the itching and this should resolve.  Take care.

## 2012-08-02 NOTE — Progress Notes (Signed)
Blistering/bumpy/peeling rash on arms and back from likely sunburn.  Had been at the beach, used sunscreen, but it may have rubbed off. No FCVD.    Meds, vitals, and allergies reviewed.   ROS: See HPI.  Otherwise, noncontributory.  nad Back with diffuse blanching erythema with peeling skin noted, no bullous lesions.

## 2012-08-02 NOTE — Assessment & Plan Note (Signed)
Should resolve, use pred taper for itching with steroid caution and f/u prn.  No sign of infection or complicating factor.  He agrees.

## 2012-08-07 ENCOUNTER — Encounter: Payer: Self-pay | Admitting: Family Medicine

## 2012-08-23 ENCOUNTER — Encounter: Payer: Self-pay | Admitting: Family Medicine

## 2012-08-23 ENCOUNTER — Ambulatory Visit (INDEPENDENT_AMBULATORY_CARE_PROVIDER_SITE_OTHER): Payer: BC Managed Care – PPO | Admitting: Family Medicine

## 2012-08-23 VITALS — BP 122/90 | HR 86 | Temp 98.0°F | Wt 295.0 lb

## 2012-08-23 DIAGNOSIS — R1030 Lower abdominal pain, unspecified: Secondary | ICD-10-CM

## 2012-08-23 DIAGNOSIS — R109 Unspecified abdominal pain: Secondary | ICD-10-CM

## 2012-08-23 MED ORDER — GABAPENTIN 300 MG PO CAPS: 300.0000 mg | ORAL_CAPSULE | Freq: Three times a day (TID) | ORAL | Status: DC

## 2012-08-23 NOTE — Patient Instructions (Addendum)
Start with 1 gabapentin a day, gradually increase up to 3 a day.  Sedation caution.  Take care.  I'll talk to uro.

## 2012-08-23 NOTE — Progress Notes (Signed)
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  The pain is increasing in the last week.  "I'm walking like an old man."  Having to sleep with legs abducted.  Pain meds aren't helping.  On cipro per uro.  More swelling recently.  The L testicle feels tougher per patient report, not an acute change.  No burning with urination.  He has been drinking more water.    Preop the pain was dull, sore, pressure on the L testicle. That pain was better after surgery but now he has a different pain, stabbing, burning, "electrical".  Very sensitive to touch.  More pain with walking.  He has tried to be more active recently.  He has his urine checked at [facility] clinic.    Uro clinic contact number [phone]. Dr. Gerome Sam  Meds, vitals, and allergies reviewed.   ROS: See HPI.  Otherwise, noncontributory.  nad but uncomfortable Testes bilaterally descended without nodularity or masses. No scrotal masses or lesions. No penis lesions or urethral discharge. L testicle with tenderness with palpation, esp inferiorly.  R testicle not ttp.

## 2012-08-24 ENCOUNTER — Telehealth: Payer: Self-pay | Admitting: Family Medicine

## 2012-08-24 ENCOUNTER — Encounter: Payer: Self-pay | Admitting: Family Medicine

## 2012-08-24 NOTE — Assessment & Plan Note (Signed)
S/p hydrocele removal.  Now with continued but different pain.  "electrical pain" d/w pt.  Would be reasonable to add on neurontin with sedation caution. I called Dr. Gerome Sam before 5PM on the same day and his office had already closed.  Will try to contact tomorrow.  Pt agrees with plan.

## 2012-08-24 NOTE — Telephone Encounter (Signed)
Called uro clinic, talked with MD.    Please call pt.  If the gabapentin isn't helping a lot in the next few days, then uro wants to see him back.   Have him call uro clinic Monday if not improved. Thanks.

## 2012-08-24 NOTE — Telephone Encounter (Signed)
Patient notified as instructed by telephone. 

## 2012-08-27 NOTE — Telephone Encounter (Signed)
Noted, thanks!

## 2012-08-27 NOTE — Telephone Encounter (Signed)
Pt wanted Dr Para March to know that there is no improvement and pt has call in to uro clinic; pt wanted Dr Para March to also be aware of pt condition. Pt said he would call back and let Dr Para March know uro clinics suggestion.

## 2012-08-29 ENCOUNTER — Emergency Department (HOSPITAL_COMMUNITY): Payer: BC Managed Care – PPO

## 2012-08-29 ENCOUNTER — Emergency Department (HOSPITAL_COMMUNITY)
Admission: EM | Admit: 2012-08-29 | Discharge: 2012-08-29 | Disposition: A | Discharge status: Home / Self Care | Payer: BC Managed Care – PPO | Attending: Emergency Medicine | Admitting: Emergency Medicine

## 2012-08-29 ENCOUNTER — Encounter (HOSPITAL_COMMUNITY): Payer: Self-pay

## 2012-08-29 DIAGNOSIS — F3289 Other specified depressive episodes: Secondary | ICD-10-CM | POA: Insufficient documentation

## 2012-08-29 DIAGNOSIS — M549 Dorsalgia, unspecified: Secondary | ICD-10-CM | POA: Insufficient documentation

## 2012-08-29 DIAGNOSIS — N5082 Scrotal pain: Secondary | ICD-10-CM

## 2012-08-29 DIAGNOSIS — G4700 Insomnia, unspecified: Secondary | ICD-10-CM | POA: Insufficient documentation

## 2012-08-29 DIAGNOSIS — Z9089 Acquired absence of other organs: Secondary | ICD-10-CM | POA: Insufficient documentation

## 2012-08-29 DIAGNOSIS — Z87442 Personal history of urinary calculi: Secondary | ICD-10-CM | POA: Insufficient documentation

## 2012-08-29 DIAGNOSIS — Z79899 Other long term (current) drug therapy: Secondary | ICD-10-CM | POA: Insufficient documentation

## 2012-08-29 DIAGNOSIS — K219 Gastro-esophageal reflux disease without esophagitis: Secondary | ICD-10-CM | POA: Insufficient documentation

## 2012-08-29 DIAGNOSIS — Z9889 Other specified postprocedural states: Secondary | ICD-10-CM | POA: Insufficient documentation

## 2012-08-29 DIAGNOSIS — N509 Disorder of male genital organs, unspecified: Secondary | ICD-10-CM | POA: Insufficient documentation

## 2012-08-29 DIAGNOSIS — N50812 Left testicular pain: Secondary | ICD-10-CM

## 2012-08-29 DIAGNOSIS — G40909 Epilepsy, unspecified, not intractable, without status epilepticus: Secondary | ICD-10-CM | POA: Insufficient documentation

## 2012-08-29 DIAGNOSIS — F329 Major depressive disorder, single episode, unspecified: Secondary | ICD-10-CM | POA: Insufficient documentation

## 2012-08-29 LAB — POCT I-STAT, CHEM 8
BUN: 7 mg/dL (ref 6–23)
Calcium, Ion: 1.22 mmol/L (ref 1.12–1.23)
Chloride: 101 mEq/L (ref 96–112)
Creatinine, Ser: 0.9 mg/dL (ref 0.50–1.35)
Glucose, Bld: 103 mg/dL — ABNORMAL HIGH (ref 70–99)
HCT: 50 % (ref 39.0–52.0)
Hemoglobin: 17 g/dL (ref 13.0–17.0)
Potassium: 4 mEq/L (ref 3.5–5.1)
Sodium: 139 mEq/L (ref 135–145)
TCO2: 26 mmol/L (ref 0–100)

## 2012-08-29 LAB — URINALYSIS, ROUTINE W REFLEX MICROSCOPIC
Bilirubin Urine: NEGATIVE
Glucose, UA: NEGATIVE mg/dL
Ketones, ur: NEGATIVE mg/dL
Leukocytes, UA: NEGATIVE
Nitrite: NEGATIVE
Protein, ur: NEGATIVE mg/dL
Specific Gravity, Urine: 1.03 (ref 1.005–1.030)
Urobilinogen, UA: 0.2 mg/dL (ref 0.0–1.0)
pH: 7 (ref 5.0–8.0)

## 2012-08-29 LAB — URINE MICROSCOPIC-ADD ON

## 2012-08-29 MED ORDER — HYDROMORPHONE HCL PF 1 MG/ML IJ SOLN
1.0000 mg | Freq: Once | INTRAMUSCULAR | Status: AC
  Administered 2012-08-29: 1 mg via INTRAVENOUS
  Filled 2012-08-29: qty 1

## 2012-08-29 MED ORDER — ONDANSETRON HCL 4 MG/2ML IJ SOLN
4.0000 mg | Freq: Once | INTRAMUSCULAR | Status: AC
  Administered 2012-08-29: 4 mg via INTRAVENOUS
  Filled 2012-08-29: qty 2

## 2012-08-29 MED ORDER — SODIUM CHLORIDE 0.9 % IV BOLUS (SEPSIS)
500.0000 mL | Freq: Once | INTRAVENOUS | Status: AC
  Administered 2012-08-29: 500 mL via INTRAVENOUS

## 2012-08-29 NOTE — ED Notes (Signed)
Pt c/o lower back pain and lt testicle pain x2wks, states had surgery 2months ago

## 2012-08-29 NOTE — ED Notes (Signed)
Pt escorted to discharge window. Pt verbalized understanding discharge instructions. In no acute distress.  

## 2012-08-29 NOTE — ED Notes (Signed)
Ray, MD at bedside.  

## 2012-08-29 NOTE — ED Provider Notes (Signed)
I discussed the ultrasound results with the patient. He relates to me that his pain is primarily in the scrotum, left-sided. He has been seeing his PCP, and his urologist regularly for this persistent pain. He was recently started on gabapentin. He asked me about moving the left testicle as a treatment for the pain. I explained that I could not answer that question. He would like a referral to another urologist for a second opinion. He will be given the name of the on-call urologist.    Casey Melter, MD 08/29/12 [phone]

## 2012-08-29 NOTE — ED Provider Notes (Addendum)
CSN: [id number]     Arrival date & time 08/29/12  1258 History     First MD Initiated Contact with Patient 08/29/12 1320     Chief Complaint  Patient presents with  . Back Pain  . Groin Pain   (Consider location/radiation/quality/duration/timing/severity/associated sxs/prior Treatment) HPI 32 y.o. Male complaining left back and left testicle for three days.  Patient with history of kidney stones s/p lithotripsy and surgery for epdidymitis.  Pain is dull and sharp worse in left testicle and radiates to left flank and back.  Improves with nothing.  Worsens wit walking around.  Denies blood in urine, frequency, or dysuria, nausea but no vomiting, no fever, or chills. Takes zofran for nausea.   Past Medical History  Diagnosis Date  . GERD (gastroesophageal reflux disease)   . History of kidney stones   . Seizures Childhood  . Depression     and panic/anxiety  . Insomnia   . Kidney stones    Past Surgical History  Procedure Laterality Date  . Lithotripsy      four times over the years  . Appendectomy    . Cholecystectomy    . Tonsillectomy    . Wrist ganglion excision      Right  . Hydrocele excision Left 2014   Family History  Problem Relation Age of Onset  . Stroke Mother   . Heart disease Father   . Liver disease Mother   . Irritable bowel syndrome Mother   . Kidney disease Mother   . Colon polyps Father    History  Substance Use Topics  . Smoking status: Never Smoker   . Smokeless tobacco: Never Used  . Alcohol Use: Yes     Comment: rarely    Review of Systems  All other systems reviewed and are negative.    Allergies  Oxycodone  Home Medications   Current Outpatient Rx  Name  Route  Sig  Dispense  Refill  . ciprofloxacin (CIPRO) 500 MG tablet   Oral   Take 500 mg by mouth 2 (two) times daily.         Marland Kitchen docusate sodium (COLACE) 100 MG capsule   Oral   Take 1 capsule (100 mg total) by mouth 2 (two) times daily.   60 capsule   6   . doxycycline  (VIBRAMYCIN) 100 MG capsule   Oral   Take 100 mg by mouth 2 (two) times daily.         Marland Kitchen gabapentin (NEURONTIN) 300 MG capsule   Oral   Take 1 capsule (300 mg total) by mouth 3 (three) times daily.   90 capsule   1   . HYDROcodone-acetaminophen (NORCO/VICODIN) 5-325 MG per tablet   Oral   Take 1 tablet by mouth every 8 (eight) hours as needed for pain.   90 tablet   1   . pantoprazole (PROTONIX) 40 MG tablet   Oral   Take 1 tablet (40 mg total) by mouth 2 (two) times daily.   60 tablet   3   . promethazine (PHENERGAN) 25 MG tablet   Oral   Take 25 mg by mouth every 6 (six) hours as needed for nausea.         Marland Kitchen zolpidem (AMBIEN) 10 MG tablet   Oral   Take 1 tablet (10 mg total) by mouth at bedtime.   30 tablet   1    BP 134/104  Pulse 110  Temp(Src) 98.1 F (36.7 C) (Oral)  Resp 20  SpO2 94% Physical Exam  Nursing note and vitals reviewed. Constitutional: He is oriented to person, place, and time. He appears well-developed and well-nourished.  HENT:  Head: Normocephalic and atraumatic.  Right Ear: External ear normal.  Left Ear: External ear normal.  Nose: Nose normal.  Mouth/Throat: Oropharynx is clear and moist.  Eyes: Conjunctivae and EOM are normal. Pupils are equal, round, and reactive to light.  Neck: Normal range of motion. Neck supple.  Cardiovascular: Normal rate, regular rhythm, normal heart sounds and intact distal pulses.   Pulmonary/Chest: Effort normal and breath sounds normal. No respiratory distress. He has no wheezes. He exhibits no tenderness.  Abdominal: Soft. Bowel sounds are normal. He exhibits no distension and no mass. There is no tenderness. There is no guarding.  Musculoskeletal: Normal range of motion.  Neurological: He is alert and oriented to person, place, and time. He has normal reflexes. He exhibits normal muscle tone. Coordination normal.  Skin: Skin is warm and dry.  Psychiatric: He has a normal mood and affect. His behavior  is normal. Judgment and thought content normal.    ED Course   Procedures (including critical care time)  Labs Reviewed - No data to display No results found. No diagnosis found.  MDM  Patient with multiple recent narcotics prescribed.  I have reviewed the notes and labs and ct.  Patient is having an ultrasound of the testicle.  He will be discharged if this is normal.  I have discussed his care with Dr. Effie Shy.   Hilario Quarry, MD 08/29/12 9147  Hilario Quarry, MD 09/13/12 8295

## 2012-08-30 ENCOUNTER — Encounter: Payer: Self-pay | Admitting: Family Medicine

## 2012-08-30 ENCOUNTER — Other Ambulatory Visit: Payer: Self-pay

## 2012-08-30 NOTE — Telephone Encounter (Signed)
Pt request refill hydrocodone apap and zolpidem to [facility] [address]. Pt was seen 08/29/12 [place] for testicular pain. Pt is calling [facility] for appt for f/u of ED visit and for 2nd opinion. Pt said he is OK today but still in pain.

## 2012-08-31 MED ORDER — HYDROCODONE-ACETAMINOPHEN 5-325 MG PO TABS: 1.0000 | ORAL_TABLET | Freq: Three times a day (TID) | ORAL | Status: DC | PRN

## 2012-08-31 NOTE — Telephone Encounter (Signed)
I phoned the pharmacy.  No refills remaining.  Original filled 07/19/12 and then a refill picked up in July.

## 2012-08-31 NOTE — Telephone Encounter (Signed)
Medication phoned to pharmacy.  

## 2012-08-31 NOTE — Telephone Encounter (Signed)
Okay to call in the hydrocodone as he pain had been worse recently but the Remus Loffler is too early.  Please call in hydrocodone.

## 2012-08-31 NOTE — Telephone Encounter (Signed)
Verify this. He should still have a refill on the rxs.  Thanks.

## 2012-09-05 NOTE — Telephone Encounter (Signed)
Pt called for status of refills; spoke with Casey Caldwell; Hydrocodone too early to fill; pt can get on 09/07/12 and zolpidem is 6 days early(pt does not have refills on zolpidem)the patient voiced understanding.

## 2012-09-06 ENCOUNTER — Other Ambulatory Visit: Payer: Self-pay | Admitting: *Deleted

## 2012-09-06 NOTE — Telephone Encounter (Signed)
Last filled 08/11/12

## 2012-09-06 NOTE — Telephone Encounter (Signed)
The ambien isn't due for refill until 09/17/12.  This is still early.

## 2012-09-18 ENCOUNTER — Other Ambulatory Visit: Payer: Self-pay | Admitting: *Deleted

## 2012-09-18 MED ORDER — ZOLPIDEM TARTRATE 10 MG PO TABS: 10.0000 mg | ORAL_TABLET | Freq: Every day | ORAL | Status: DC

## 2012-09-18 NOTE — Telephone Encounter (Signed)
Please call in

## 2012-09-18 NOTE — Telephone Encounter (Signed)
Last filled 08/11/12 

## 2012-09-18 NOTE — Telephone Encounter (Signed)
Pt called for refill status of ambien; Medication phoned to [facility] as instructed.pt notified done.

## 2012-09-27 ENCOUNTER — Encounter (HOSPITAL_COMMUNITY): Payer: Self-pay | Admitting: Emergency Medicine

## 2012-09-27 ENCOUNTER — Emergency Department (HOSPITAL_COMMUNITY): Payer: BC Managed Care – PPO

## 2012-09-27 ENCOUNTER — Emergency Department (HOSPITAL_COMMUNITY)
Admission: EM | Admit: 2012-09-27 | Discharge: 2012-09-27 | Disposition: A | Discharge status: Home / Self Care | Payer: BC Managed Care – PPO | Attending: Emergency Medicine | Admitting: Emergency Medicine

## 2012-09-27 DIAGNOSIS — Z9089 Acquired absence of other organs: Secondary | ICD-10-CM | POA: Insufficient documentation

## 2012-09-27 DIAGNOSIS — Z8669 Personal history of other diseases of the nervous system and sense organs: Secondary | ICD-10-CM | POA: Insufficient documentation

## 2012-09-27 DIAGNOSIS — F3289 Other specified depressive episodes: Secondary | ICD-10-CM | POA: Insufficient documentation

## 2012-09-27 DIAGNOSIS — K219 Gastro-esophageal reflux disease without esophagitis: Secondary | ICD-10-CM | POA: Insufficient documentation

## 2012-09-27 DIAGNOSIS — G4700 Insomnia, unspecified: Secondary | ICD-10-CM | POA: Insufficient documentation

## 2012-09-27 DIAGNOSIS — N23 Unspecified renal colic: Secondary | ICD-10-CM | POA: Insufficient documentation

## 2012-09-27 DIAGNOSIS — Z9889 Other specified postprocedural states: Secondary | ICD-10-CM | POA: Insufficient documentation

## 2012-09-27 DIAGNOSIS — Z87442 Personal history of urinary calculi: Secondary | ICD-10-CM | POA: Insufficient documentation

## 2012-09-27 DIAGNOSIS — Z792 Long term (current) use of antibiotics: Secondary | ICD-10-CM | POA: Insufficient documentation

## 2012-09-27 DIAGNOSIS — F329 Major depressive disorder, single episode, unspecified: Secondary | ICD-10-CM | POA: Insufficient documentation

## 2012-09-27 LAB — URINALYSIS, ROUTINE W REFLEX MICROSCOPIC
Bilirubin Urine: NEGATIVE
Glucose, UA: NEGATIVE mg/dL
Hgb urine dipstick: NEGATIVE
Ketones, ur: NEGATIVE mg/dL
Leukocytes, UA: NEGATIVE
Nitrite: NEGATIVE
Protein, ur: NEGATIVE mg/dL
Specific Gravity, Urine: 1.031 — ABNORMAL HIGH (ref 1.005–1.030)
Urobilinogen, UA: 0.2 mg/dL (ref 0.0–1.0)
pH: 5.5 (ref 5.0–8.0)

## 2012-09-27 LAB — POCT I-STAT, CHEM 8
BUN: 13 mg/dL (ref 6–23)
Calcium, Ion: 1.16 mmol/L (ref 1.12–1.23)
Chloride: 105 mEq/L (ref 96–112)
Creatinine, Ser: 0.9 mg/dL (ref 0.50–1.35)
Glucose, Bld: 89 mg/dL (ref 70–99)
HCT: 47 % (ref 39.0–52.0)
Hemoglobin: 16 g/dL (ref 13.0–17.0)
Potassium: 3.9 mEq/L (ref 3.5–5.1)
Sodium: 141 mEq/L (ref 135–145)
TCO2: 24 mmol/L (ref 0–100)

## 2012-09-27 MED ORDER — KETOROLAC TROMETHAMINE 30 MG/ML IJ SOLN
30.0000 mg | Freq: Once | INTRAMUSCULAR | Status: AC
  Administered 2012-09-27: 30 mg via INTRAMUSCULAR
  Filled 2012-09-27: qty 1

## 2012-09-27 MED ORDER — HYDROCODONE-ACETAMINOPHEN 10-300 MG PO TABS: 1.0000 | ORAL_TABLET | ORAL | Status: DC | PRN

## 2012-09-27 MED ORDER — MORPHINE SULFATE 4 MG/ML IJ SOLN: 6.0000 mg | INTRAMUSCULAR | Status: DC | PRN

## 2012-09-27 MED ORDER — HYDROMORPHONE HCL PF 1 MG/ML IJ SOLN
1.0000 mg | Freq: Once | INTRAMUSCULAR | Status: AC
Start: 2012-09-27 — End: 2012-09-27
  Administered 2012-09-27: 1 mg via INTRAMUSCULAR
  Filled 2012-09-27: qty 1

## 2012-09-27 MED ORDER — ONDANSETRON HCL 4 MG PO TABS: 4.0000 mg | ORAL_TABLET | Freq: Four times a day (QID) | ORAL | Status: DC

## 2012-09-27 MED ORDER — ONDANSETRON 4 MG PO TBDP
4.0000 mg | ORAL_TABLET | Freq: Once | ORAL | Status: AC
  Administered 2012-09-27: 4 mg via ORAL
  Filled 2012-09-27: qty 1

## 2012-09-27 NOTE — ED Provider Notes (Signed)
CSN: [id number]     Arrival date & time 09/27/12  1403 History   First MD Initiated Contact with Patient 09/27/12 1553     Chief Complaint  Patient presents with  . Groin Pain  . Flank Pain   (Consider location/radiation/quality/duration/timing/severity/associated sxs/prior Treatment) HPI  32 year old male with right groin pain. Onset last night. Pain is constant with occasional much sharper pain. Pain radiates from right flank down into his right testicle. Describes it is "like lightening." Patient with a history of chronic left-sided testicular pain. He is followed by urology. No fevers or chills. No urinary complaints. Has been taking hydrocodone with only mild relief.  Past Medical History  Diagnosis Date  . GERD (gastroesophageal reflux disease)   . History of kidney stones   . Seizures Childhood  . Depression     and panic/anxiety  . Insomnia   . Kidney stones    Past Surgical History  Procedure Laterality Date  . Lithotripsy      four times over the years  . Appendectomy    . Cholecystectomy    . Tonsillectomy    . Wrist ganglion excision      Right  . Hydrocele excision Left 2014   Family History  Problem Relation Age of Onset  . Stroke Mother   . Heart disease Father   . Liver disease Mother   . Irritable bowel syndrome Mother   . Kidney disease Mother   . Colon polyps Father    History  Substance Use Topics  . Smoking status: Never Smoker   . Smokeless tobacco: Never Used  . Alcohol Use: Yes     Comment: rarely    Review of Systems  All systems reviewed and negative, other than as noted in HPI.   Allergies  Oxycodone  Home Medications   Current Outpatient Rx  Name  Route  Sig  Dispense  Refill  . docusate sodium (COLACE) 100 MG capsule   Oral   Take 1 capsule (100 mg total) by mouth 2 (two) times daily.   60 capsule   6   . gabapentin (NEURONTIN) 300 MG capsule   Oral   Take 1 capsule (300 mg total) by mouth 3 (three) times daily.   90  capsule   1   . HYDROcodone-acetaminophen (NORCO/VICODIN) 5-325 MG per tablet   Oral   Take 1 tablet by mouth every 8 (eight) hours as needed for pain.   90 tablet   0   . pantoprazole (PROTONIX) 40 MG tablet   Oral   Take 1 tablet (40 mg total) by mouth 2 (two) times daily.   60 tablet   3   . promethazine (PHENERGAN) 25 MG tablet   Oral   Take 25 mg by mouth every 6 (six) hours as needed for nausea.         . vitamin C (ASCORBIC ACID) 500 MG tablet   Oral   Take 500 mg by mouth daily.         Marland Kitchen zolpidem (AMBIEN) 10 MG tablet   Oral   Take 1 tablet (10 mg total) by mouth at bedtime.   30 tablet   1   . ciprofloxacin (CIPRO) 500 MG tablet   Oral   Take 500 mg by mouth 2 (two) times daily.         Marland Kitchen doxycycline (VIBRAMYCIN) 100 MG capsule   Oral   Take 100 mg by mouth 2 (two) times daily.  BP 112/79  Pulse 88  Temp(Src) 98 F (36.7 C) (Oral)  Resp 22  SpO2 99% Physical Exam  Nursing note and vitals reviewed. Constitutional: He appears well-developed and well-nourished. No distress.  HENT:  Head: Normocephalic and atraumatic.  Eyes: Conjunctivae are normal. Right eye exhibits no discharge. Left eye exhibits no discharge.  Neck: Neck supple.  Cardiovascular: Normal rate, regular rhythm and normal heart sounds.  Exam reveals no gallop and no friction rub.   No murmur heard. Pulmonary/Chest: Effort normal and breath sounds normal. No respiratory distress.  Abdominal: Soft. He exhibits no distension. There is no tenderness.  Genitourinary:  Normal external male genitalia. The left testicle is exquisitely tender to palpation. No masses palpated. A master reflex is intact. No penile discharge. No hernia appreciated.  Musculoskeletal: He exhibits no edema and no tenderness.  Neurological: He is alert.  Skin: Skin is warm and dry.  Psychiatric: He has a normal mood and affect. His behavior is normal. Thought content normal.    ED Course   Procedures (including critical care time) Labs Review Labs Reviewed  URINALYSIS, ROUTINE W REFLEX MICROSCOPIC - Abnormal; Notable for the following:    Specific Gravity, Urine 1.031 (*)    All other components within normal limits   Imaging Review No results found.  Ct Abdomen Pelvis Wo Contrast  09/27/2012   *RADIOLOGY REPORT*  Clinical Data: Groin and flank pain since last night.  History of kidney stones.  CT ABDOMEN AND PELVIS WITHOUT CONTRAST  Technique:  Multidetector CT imaging of the abdomen and pelvis was performed following the standard protocol without intravenous contrast.  Comparison: 07/19/2012  Findings: There is a 5 mm stone in the proximal right ureter causing mild right hydronephrosis.  There is minor right perinephric stranding.  There are bilateral small nonobstructing intrarenal stones.  There are no other ureteral stones.  There are no renal masses.  The bladder is decompressed not well evaluated.  Minor subsegmental atelectasis or scarring is noted at the base of the lingula of the left upper lobe, stable.  The lung bases are otherwise clear.  There is diffuse fatty infiltration of the liver without a discrete mass or focal lesion.  Normal spleen and pancreas.  The gallbladder is surgically absent.  There is no bile duct dilation.  No adrenal masses.  There is no adenopathy.  The bowel is unremarkable.  Mild degenerative changes are noted throughout the visualized spine.  No osteoblastic or osteolytic lesions.  IMPRESSION: 5 mm stone in the proximal right ureter causes mild right hydronephrosis.  Small bilateral nonobstructing intrarenal stones.   Original Report Authenticated By: Amie Portland, M.D.   MDM   1. Ureteral colic     32yM with testicular pain. "Lightning shocks" and exam not typical of symptoms of ureteral colic but CT does show a R ureteral stone.  His L sided symptoms seem most consistent with a neuropathic issue possibly related to prior testicular appendage  torsion ("pearl" note on recent US) or surgery. Has established urologic and PCP care. In further reviewing records and repeat discussion with pt this seems to be more of a chronic issue. Despite Pt's c/o R testicular pain, his L testicle is tender. My exam today seems consistent with exams of previous providers. R sided pain more than likely referred from R ureteral stone. Renal function normal. No UA evidence of infection. Symptomatic tx and expectant management.   Raeford Razor, MD 10/03/12 [phone]

## 2012-09-27 NOTE — ED Notes (Signed)
Pt has groin and flank pain since last night states hx of kideny stones and has a hx of this. N/v

## 2012-10-01 ENCOUNTER — Other Ambulatory Visit: Payer: Self-pay | Admitting: Internal Medicine

## 2012-10-02 ENCOUNTER — Ambulatory Visit (INDEPENDENT_AMBULATORY_CARE_PROVIDER_SITE_OTHER): Payer: BC Managed Care – PPO | Admitting: Family Medicine

## 2012-10-02 ENCOUNTER — Encounter: Payer: Self-pay | Admitting: Family Medicine

## 2012-10-02 VITALS — BP 126/90 | HR 80 | Temp 97.9°F | Wt 294.5 lb

## 2012-10-02 DIAGNOSIS — R109 Unspecified abdominal pain: Secondary | ICD-10-CM

## 2012-10-02 DIAGNOSIS — R1030 Lower abdominal pain, unspecified: Secondary | ICD-10-CM

## 2012-10-02 MED ORDER — HYDROCODONE-ACETAMINOPHEN 10-300 MG PO TABS: 1.0000 | ORAL_TABLET | Freq: Three times a day (TID) | ORAL | Status: DC | PRN

## 2012-10-02 NOTE — Patient Instructions (Addendum)
Gabapentin taper- 2-2-2-2-1-1-1-1 then stop.  If you realize it was helping then restart it with a gradual taper.  Take the hydrocodone every 8 hours.  When you are running low, then notify the pharmacy.  Take care.

## 2012-10-02 NOTE — Progress Notes (Signed)
F/u for testicle pain in ER.    He had uro eval and has planned surgery for stones on R side.  Plan for L orchiectomy in 12/14 for chronic L testicle pain.  Gabapentin didn't help with the pain. Still needing vicodin for pain and still with pain at night interrupting sleep.  10mg  of vicodin per dose dulls the pain some. No ADE from medication other than constipation prev- on OTC meds with relief. R testicle pain was likely from the R renal stone- referred pain.  He already banked sperm per uro.    The plan is to try to get his pain controlled until he can have surgery.  We discussed.   His f/u with neuro was pushed back and f/u with GI is pending.    Meds, vitals, and allergies reviewed.   ROS: See HPI.  Otherwise, noncontributory.  nad ncat rrr ctab No testicle exam due to chronic discomfort.

## 2012-10-03 NOTE — Assessment & Plan Note (Signed)
The plan is to try to get his pain controlled until he can have surgery, ie L orchiectomy in 12/14 and procedure for R renal stones.  We discussed.  No sedation on meds.  Pain is increased and needs a higher dose of hydrocodone, ie 10mg  per dose instead of 5mg .  Rx for 90 pills, call pharmacy for refills. Continue other meds except taper gabapentin.  He agrees. I'll await uro/GI/neuro f/u.

## 2012-10-11 ENCOUNTER — Encounter: Payer: Self-pay | Admitting: Family Medicine

## 2012-10-12 ENCOUNTER — Telehealth: Payer: Self-pay | Admitting: Family Medicine

## 2012-10-12 NOTE — Telephone Encounter (Signed)
See phone note

## 2012-10-12 NOTE — Telephone Encounter (Signed)
I called pt.  He had the lithotripsy and is passing more stones.  Thus, he needed more doses of hydrocodone in the meantime.  He'll likely run out of medicine earlier than expected.  He had another rx from urology to cover the extra pain med requirement in the meantime but this was later denied by uro.  Pt called about this.  I talked to pharmacy.  I verified it all with pharmacy.  This is not suspect behavior by the patient.   I'll likely put on the next rx that he'll need an early fill due to extra pain med requirement due to the surgery for stones.

## 2012-10-19 ENCOUNTER — Encounter: Payer: Self-pay | Admitting: Family Medicine

## 2012-10-22 ENCOUNTER — Other Ambulatory Visit: Payer: Self-pay | Admitting: Family Medicine

## 2012-10-22 MED ORDER — HYDROCODONE-ACETAMINOPHEN 10-300 MG PO TABS: 1.0000 | ORAL_TABLET | Freq: Three times a day (TID) | ORAL | Status: DC | PRN

## 2012-10-22 NOTE — Progress Notes (Signed)
Medication phoned to pharmacy.  

## 2012-10-22 NOTE — Progress Notes (Signed)
Please call in.  See extra note [id number] date on the rx.  Thanks.

## 2012-10-30 ENCOUNTER — Ambulatory Visit (AMBULATORY_SURGERY_CENTER): Payer: Self-pay | Admitting: *Deleted

## 2012-10-30 ENCOUNTER — Encounter: Payer: Self-pay | Admitting: Internal Medicine

## 2012-10-30 VITALS — Ht 72.0 in | Wt 294.8 lb

## 2012-10-30 DIAGNOSIS — Z86010 Personal history of colonic polyps: Secondary | ICD-10-CM

## 2012-10-30 MED ORDER — MOVIPREP 100 G PO SOLR: ORAL | Status: DC

## 2012-10-30 NOTE — Progress Notes (Signed)
No allergies to eggs or soy. No problems with anesthesia.  

## 2012-11-08 ENCOUNTER — Ambulatory Visit (AMBULATORY_SURGERY_CENTER): Payer: BC Managed Care – PPO | Admitting: Internal Medicine

## 2012-11-08 ENCOUNTER — Encounter: Payer: Self-pay | Admitting: Internal Medicine

## 2012-11-08 VITALS — BP 107/69 | HR 71 | Temp 97.9°F | Resp 18 | Ht 72.0 in | Wt 294.0 lb

## 2012-11-08 DIAGNOSIS — Z86010 Personal history of colonic polyps: Secondary | ICD-10-CM

## 2012-11-08 DIAGNOSIS — Z1211 Encounter for screening for malignant neoplasm of colon: Secondary | ICD-10-CM

## 2012-11-08 MED ORDER — SUCRALFATE 1 G PO TABS: 1.0000 g | ORAL_TABLET | Freq: Three times a day (TID) | ORAL | Status: DC

## 2012-11-08 MED ORDER — SODIUM CHLORIDE 0.9 % IV SOLN: 500.0000 mL | INTRAVENOUS | Status: DC

## 2012-11-08 NOTE — Patient Instructions (Signed)
YOU HAD AN ENDOSCOPIC PROCEDURE TODAY AT THE [facility]: Refer to the procedure report that was given to you for any specific questions about what was found during the examination.  If the procedure report does not answer your questions, please call your gastroenterologist to clarify.  If you requested that your care partner not be given the details of your procedure findings, then the procedure report has been included in a sealed envelope for you to review at your convenience later.  YOU SHOULD EXPECT: Some feelings of bloating in the abdomen. Passage of more gas than usual.  Walking can help get rid of the air that was put into your GI tract during the procedure and reduce the bloating. If you had a lower endoscopy (such as a colonoscopy or flexible sigmoidoscopy) you may notice spotting of blood in your stool or on the toilet paper. If you underwent a bowel prep for your procedure, then you may not have a normal bowel movement for a few days.  DIET: Your first meal following the procedure should be a light meal and then it is ok to progress to your normal diet.  A half-sandwich or bowl of soup is an example of a good first meal.  Heavy or fried foods are harder to digest and may make you feel nauseous or bloated.  Likewise meals heavy in dairy and vegetables can cause extra gas to form and this can also increase the bloating.  Drink plenty of fluids but you should avoid alcoholic beverages for 24 hours.  ACTIVITY: Your care partner should take you home directly after the procedure.  You should plan to take it easy, moving slowly for the rest of the day.  You can resume normal activity the day after the procedure however you should NOT DRIVE or use heavy machinery for 24 hours (because of the sedation medicines used during the test).    SYMPTOMS TO REPORT IMMEDIATELY: A gastroenterologist can be reached at any hour.  During normal business hours, 8:30 AM to 5:00 PM Monday through Friday,  call [phone].  After hours and on weekends, please call the GI answering service at [phone] who will take a message and have the physician on call contact you.   Following lower endoscopy (colonoscopy or flexible sigmoidoscopy):  Excessive amounts of blood in the stool  Significant tenderness or worsening of abdominal pains  Swelling of the abdomen that is new, acute  Fever of 100F or higher    FOLLOW UP: If any biopsies were taken you will be contacted by phone or by letter within the next 1-3 weeks.  Call your gastroenterologist if you have not heard about the biopsies in 3 weeks.  Our staff will call the home number listed on your records the next business day following your procedure to check on you and address any questions or concerns that you may have at that time regarding the information given to you following your procedure. This is a courtesy call and so if there is no answer at the home number and we have not heard from you through the emergency physician on call, we will assume that you have returned to your regular daily activities without incident.  SIGNATURES/CONFIDENTIALITY: You and/or your care partner have signed paperwork which will be entered into your electronic medical record.  These signatures attest to the fact that that the information above on your After Visit Summary has been reviewed and is understood.  Full responsibility of the confidentiality   of this discharge information lies with you and/or your care-partner.     

## 2012-11-08 NOTE — Op Note (Signed)
[facility] [address] [place], [address]   COLONOSCOPY PROCEDURE REPORT  PATIENT: Casey Caldwell, Casey Caldwell  MR#: [id number] BIRTHDATE: 08/23/1980 , 32  yrs. old GENDER: Male ENDOSCOPIST: Beverley Fiedler, MD PROCEDURE DATE:  11/08/2012 PROCEDURE:   Colonoscopy, surveillance First Screening Colonoscopy - Avg.  risk and is 50 yrs.  old or older - No.  Prior Negative Screening - Now for repeat screening. N/A  History of Adenoma - Now for follow-up colonoscopy & has been > or = to 3 yrs.  N/A  Polyps Removed Today? No.  Recommend repeat exam, <10 yrs? Yes.  High risk (family or personal hx). ASA CLASS:   Class II INDICATIONS:elevated risk screening and Patient's personal history of adenomatous colon polyps. MEDICATIONS: MAC sedation, administered by CRNA and propofol (Diprivan) 300mg  IV  DESCRIPTION OF PROCEDURE:   After the risks benefits and alternatives of the procedure were thoroughly explained, informed consent was obtained.  A digital rectal exam revealed no rectal mass.   The LB [id number]  endoscope was introduced through the anus and advanced to the terminal ileum which was intubated for a short distance. No adverse events experienced.   The quality of the prep was good, using MoviPrep  The instrument was then slowly withdrawn as the colon was fully examined.    COLON FINDINGS: The mucosa appeared normal in the terminal ileum. A normal appearing cecum, ileocecal valve, and appendiceal orifice were identified.  The ascending, hepatic flexure, transverse, splenic flexure, descending, sigmoid colon and rectum appeared unremarkable.  No polyps or cancers were seen.  Retroflexed views revealed no abnormalities. The time to cecum=2 minutes 12 seconds. Withdrawal time=11 minutes 06 seconds.  The scope was withdrawn and the procedure completed.  COMPLICATIONS: There were no complications.  ENDOSCOPIC IMPRESSION: 1.   Normal mucosa in the terminal ileum 2.    Normal colon  RECOMMENDATIONS: Given your personal history of adenomatous (pre-cancerous) polyps, you will need a repeat colonoscopy in 5 years.   eSigned:  Beverley Fiedler, MD 11/08/2012 3:04 PM   cc: The Patient and Crawford Givens, MD

## 2012-11-08 NOTE — Progress Notes (Signed)
Patient did not experience any of the following events: a burn prior to discharge; a fall within the facility; wrong site/side/patient/procedure/implant event; or a hospital transfer or hospital admission upon discharge from the facility. (G8907) Patient did not have preoperative order for IV antibiotic SSI prophylaxis. (G8918)  

## 2012-11-09 ENCOUNTER — Telehealth: Payer: Self-pay | Admitting: *Deleted

## 2012-11-09 NOTE — Telephone Encounter (Signed)
  Follow up Call-  Call back number 11/08/2012 05/31/2012  Post procedure Call Back phone  # [phone]  Permission to leave phone message Yes Yes     Patient questions:  Do you have a fever, pain , or abdominal swelling? no Pain Score  0 *  Have you tolerated food without any problems? yes  Have you been able to return to your normal activities? yes  Do you have any questions about your discharge instructions: Diet   no Medications  no Follow up visit  no  Do you have questions or concerns about your Care? no  Actions: * If pain score is 4 or above: No action needed, pain <4.

## 2012-11-13 ENCOUNTER — Other Ambulatory Visit: Payer: Self-pay | Admitting: Family Medicine

## 2012-11-13 MED ORDER — ZOLPIDEM TARTRATE 10 MG PO TABS: 10.0000 mg | ORAL_TABLET | Freq: Every day | ORAL | Status: DC

## 2012-11-13 NOTE — Progress Notes (Signed)
Please call in ambien to fill on/after 11/15/12.  Thanks.

## 2012-11-13 NOTE — Progress Notes (Signed)
Medication phoned to pharmacy.  

## 2012-11-18 ENCOUNTER — Emergency Department (HOSPITAL_COMMUNITY)
Admission: EM | Admit: 2012-11-18 | Discharge: 2012-11-18 | Disposition: A | Discharge status: Home / Self Care | Payer: BC Managed Care – PPO | Attending: Emergency Medicine | Admitting: Emergency Medicine

## 2012-11-18 ENCOUNTER — Emergency Department (HOSPITAL_COMMUNITY): Payer: BC Managed Care – PPO

## 2012-11-18 ENCOUNTER — Encounter (HOSPITAL_COMMUNITY): Payer: Self-pay | Admitting: Emergency Medicine

## 2012-11-18 DIAGNOSIS — K219 Gastro-esophageal reflux disease without esophagitis: Secondary | ICD-10-CM | POA: Insufficient documentation

## 2012-11-18 DIAGNOSIS — F329 Major depressive disorder, single episode, unspecified: Secondary | ICD-10-CM | POA: Insufficient documentation

## 2012-11-18 DIAGNOSIS — N201 Calculus of ureter: Secondary | ICD-10-CM | POA: Insufficient documentation

## 2012-11-18 DIAGNOSIS — F3289 Other specified depressive episodes: Secondary | ICD-10-CM | POA: Insufficient documentation

## 2012-11-18 DIAGNOSIS — N23 Unspecified renal colic: Secondary | ICD-10-CM | POA: Insufficient documentation

## 2012-11-18 DIAGNOSIS — Z87442 Personal history of urinary calculi: Secondary | ICD-10-CM | POA: Insufficient documentation

## 2012-11-18 DIAGNOSIS — R110 Nausea: Secondary | ICD-10-CM | POA: Insufficient documentation

## 2012-11-18 DIAGNOSIS — R300 Dysuria: Secondary | ICD-10-CM | POA: Insufficient documentation

## 2012-11-18 DIAGNOSIS — Z79899 Other long term (current) drug therapy: Secondary | ICD-10-CM | POA: Insufficient documentation

## 2012-11-18 LAB — CBC WITH DIFFERENTIAL/PLATELET
Basophils Absolute: 0 10*3/uL (ref 0.0–0.1)
Basophils Relative: 0 % (ref 0–1)
Eosinophils Absolute: 0.2 10*3/uL (ref 0.0–0.7)
Eosinophils Relative: 2 % (ref 0–5)
HCT: 46 % (ref 39.0–52.0)
Hemoglobin: 16.7 g/dL (ref 13.0–17.0)
Lymphocytes Relative: 33 % (ref 12–46)
Lymphs Abs: 2.9 10*3/uL (ref 0.7–4.0)
MCH: 30.6 pg (ref 26.0–34.0)
MCHC: 36.3 g/dL — ABNORMAL HIGH (ref 30.0–36.0)
MCV: 84.4 fL (ref 78.0–100.0)
Monocytes Absolute: 0.9 10*3/uL (ref 0.1–1.0)
Monocytes Relative: 10 % (ref 3–12)
Neutro Abs: 4.9 10*3/uL (ref 1.7–7.7)
Neutrophils Relative %: 55 % (ref 43–77)
Platelets: 290 10*3/uL (ref 150–400)
RBC: 5.45 MIL/uL (ref 4.22–5.81)
RDW: 13.1 % (ref 11.5–15.5)
WBC: 8.9 10*3/uL (ref 4.0–10.5)

## 2012-11-18 LAB — URINALYSIS, ROUTINE W REFLEX MICROSCOPIC
Bilirubin Urine: NEGATIVE
Glucose, UA: NEGATIVE mg/dL
Hgb urine dipstick: NEGATIVE
Ketones, ur: NEGATIVE mg/dL
Leukocytes, UA: NEGATIVE
Nitrite: NEGATIVE
Protein, ur: NEGATIVE mg/dL
Specific Gravity, Urine: 1.012 (ref 1.005–1.030)
Urobilinogen, UA: 0.2 mg/dL (ref 0.0–1.0)
pH: 6 (ref 5.0–8.0)

## 2012-11-18 LAB — BASIC METABOLIC PANEL
BUN: 9 mg/dL (ref 6–23)
CO2: 27 mEq/L (ref 19–32)
Calcium: 9.8 mg/dL (ref 8.4–10.5)
Chloride: 102 mEq/L (ref 96–112)
Creatinine, Ser: 0.81 mg/dL (ref 0.50–1.35)
GFR calc Af Amer: >90 mL/min (ref >90)
GFR calc non Af Amer: >90 mL/min (ref >90)
Glucose, Bld: 100 mg/dL — ABNORMAL HIGH (ref 70–99)
Potassium: 3.9 mEq/L (ref 3.5–5.1)
Sodium: 139 mEq/L (ref 135–145)

## 2012-11-18 MED ORDER — TAMSULOSIN HCL 0.4 MG PO CAPS: 0.4000 mg | ORAL_CAPSULE | Freq: Every day | ORAL | Status: DC

## 2012-11-18 MED ORDER — IOHEXOL 300 MG/ML  SOLN
100.0000 mL | Freq: Once | INTRAMUSCULAR | Status: AC | PRN
  Administered 2012-11-18: 100 mL via INTRAVENOUS

## 2012-11-18 MED ORDER — HYDROMORPHONE HCL PF 1 MG/ML IJ SOLN
1.0000 mg | Freq: Once | INTRAMUSCULAR | Status: AC
  Administered 2012-11-18: 1 mg via INTRAVENOUS
  Filled 2012-11-18: qty 1

## 2012-11-18 MED ORDER — ONDANSETRON HCL 4 MG/2ML IJ SOLN
4.0000 mg | Freq: Once | INTRAMUSCULAR | Status: AC
  Administered 2012-11-18: 4 mg via INTRAVENOUS
  Filled 2012-11-18: qty 2

## 2012-11-18 MED ORDER — KETOROLAC TROMETHAMINE 30 MG/ML IJ SOLN
30.0000 mg | Freq: Once | INTRAMUSCULAR | Status: AC
  Administered 2012-11-18: 30 mg via INTRAVENOUS
  Filled 2012-11-18: qty 1

## 2012-11-18 MED ORDER — IOHEXOL 300 MG/ML  SOLN
50.0000 mL | Freq: Once | INTRAMUSCULAR | Status: AC | PRN
  Administered 2012-11-18: 50 mL via ORAL

## 2012-11-18 MED ORDER — ONDANSETRON 4 MG PO TBDP: ORAL_TABLET | ORAL | Status: DC

## 2012-11-18 NOTE — ED Notes (Signed)
Pt reports started having bilateral flank and groin pain d/t kidney stones a couple days ago. Today pt having pain to L flank and to groin.

## 2012-11-18 NOTE — ED Notes (Signed)
Called CT, CT tech stated pt is next on the list to go back to CT.

## 2012-11-18 NOTE — ED Notes (Signed)
Pt in CT.

## 2012-11-18 NOTE — ED Notes (Signed)
Pt passed 6mm stone last week then two days ago pt having sever pain on bilat flank and groin areas.

## 2012-11-18 NOTE — ED Provider Notes (Signed)
CSN: [id number]     Arrival date & time 11/18/12  1634 History   First MD Initiated Contact with Patient 11/18/12 1644     Chief Complaint  Patient presents with  . Nephrolithiasis   (Consider location/radiation/quality/duration/timing/severity/associated sxs/prior Treatment) HPI Casey Caldwell Is a 32 year old male with a past medical history of frequent kidney stents.  Patient also had hydration removal of the left testicle probably 4 weeks ago.  The patient states that he has chronic bilateral kidney pain due to the frequency of his kidney stents.  However he has had acute worsening of the past 24 hours.  He also denies urinary decrease in output.  The patient has been taking his hydrocodone 10/27/2023 without relief of his symptoms.  She came into the emergency department seeking control of his pain.  He has had at least 4 lithotripsies this year for stones and passed a 7 mm stone 2 weeks ago.  Patient denies hematuria but has dysuria.  The patient complains of left-sided flank pain worse than right as well as left testicular pain however this is chronic after his surgery. The patient denies any fevers, chills.  He has chronic nausea.  He denies any vomiting.  She denies any unprotected sexual encounters or discharge from his penis.  The pain is currently a 7/10.  Colicky in nature radiating to left testicle  Past Medical History  Diagnosis Date  . GERD (gastroesophageal reflux disease)   . History of kidney stones   . Seizures Childhood  . Depression     and panic/anxiety  . Insomnia   . Kidney stones    Past Surgical History  Procedure Laterality Date  . Lithotripsy      four times over the years; most recent 10/08/12  . Appendectomy  2011  . Laparoscopic cholecystectomy  1996  . Tonsillectomy  1992  . Wrist ganglion excision  2002    Right  . Hydrocele excision Left 06/2012   Family History  Problem Relation Age of Onset  . Stroke Mother   . Liver disease Mother   .  Irritable bowel syndrome Mother   . Kidney disease Mother   . Heart disease Father   . Colon polyps Father   . Colon cancer Neg Hx    History  Substance Use Topics  . Smoking status: Never Smoker   . Smokeless tobacco: Never Used  . Alcohol Use: Yes     Comment: rarely    Review of Systems Ten systems reviewed and are negative for acute change, except as noted in the HPI.   Allergies  Oxycodone  Home Medications   Current Outpatient Rx  Name  Route  Sig  Dispense  Refill  . docusate sodium (COLACE) 100 MG capsule   Oral   Take 1 capsule (100 mg total) by mouth 2 (two) times daily.   60 capsule   6   . Hydrocodone-Acetaminophen 10-300 MG TABS   Oral   Take 1 tablet by mouth every 8 (eight) hours as needed.   90 each   0     Allow early refill- pt had extra pain med requirem ...   . ondansetron (ZOFRAN) 4 MG tablet   Oral   Take 1 tablet (4 mg total) by mouth every 6 (six) hours.   12 tablet   0   . pantoprazole (PROTONIX) 40 MG tablet      Take one tablet by mouth twice daily   60 tablet   2   .  promethazine (PHENERGAN) 25 MG tablet   Oral   Take 25 mg by mouth every 6 (six) hours as needed for nausea.         . sucralfate (CARAFATE) 1 G tablet   Oral   Take 1 tablet (1 g total) by mouth 4 (four) times daily -  before meals and at bedtime.   120 tablet   1   . vitamin C (ASCORBIC ACID) 500 MG tablet   Oral   Take 500 mg by mouth daily.         Marland Kitchen zolpidem (AMBIEN) 10 MG tablet   Oral   Take 1 tablet (10 mg total) by mouth at bedtime.   30 tablet   1    BP 156/124  Pulse 89  Temp(Src) 97.9 F (36.6 C) (Oral)  Resp 20  SpO2 98% Physical Exam Physical Exam  Nursing note and vitals reviewed. Constitutional: He appears well-developed and well-nourished. No distress.  HENT:  Head: Normocephalic and atraumatic.  Eyes: Conjunctivae normal are normal. No scleral icterus.  Neck: Normal range of motion. Neck supple.  Cardiovascular: Normal  rate, regular rhythm and normal heart sounds.   Pulmonary/Chest: Effort normal and breath sounds normal. No respiratory distress.  Abdominal: Soft. There is tenderness in the left lower quadrant.  Positive CVA tenderness left side.   Musculoskeletal: He exhibits no edema.  Neurological: He is alert.  Skin: Skin is warm and dry. He is not diaphoretic.  Psychiatric: His behavior is normal.    ED Course  Procedures (including critical care time) Labs Review Labs Reviewed - No data to display Imaging Review No results found.  EKG Interpretation   None       MDM   1. Ureteral colic    Patient here with complaint of kidney stone pain.  He's had these very frequently.  He did not feel that this is different from his regular kidney stone issues.  He complains of pain of ureteral colic.  He appears very uncomfortable.  He intermittently grimaces and is unable to speak.  Obtaining CBC, basic metabolic panel, urine and ultrasound to rule out any hydronephrosis.   Patient CT shows that R ureteral stone. Will d/c with antiemetics and flomax. Patient declines pain meds as he does not want to violate pain contract. F/u eith urologist  Arthor Captain, PA-C 11/19/12 0041

## 2012-11-20 ENCOUNTER — Telehealth: Payer: Self-pay

## 2012-11-20 ENCOUNTER — Other Ambulatory Visit: Payer: Self-pay | Admitting: Family Medicine

## 2012-11-20 MED ORDER — HYDROCODONE-ACETAMINOPHEN 10-300 MG PO TABS: 1.0000 | ORAL_TABLET | Freq: Three times a day (TID) | ORAL | Status: DC | PRN

## 2012-11-20 NOTE — Telephone Encounter (Signed)
Pt left v/m; pt lives in [facility] area and will be in our area today and wants to know if can pick up hydrocodone apap today. Appears from med list rx has been printed.Please advise. Pt request cb.

## 2012-11-20 NOTE — Progress Notes (Signed)
Sent note through MyChart to patient's inquiry.  Rx left at front desk for pick up.

## 2012-11-20 NOTE — Progress Notes (Signed)
Pt will be due for rx at the end of the month. Printed.

## 2012-11-20 NOTE — Telephone Encounter (Signed)
Responded through MyChart message and Patient advised. Rx left at front desk for pick up.

## 2012-11-24 HISTORY — PX: ORCHIECTOMY: SHX2116

## 2012-11-24 NOTE — ED Provider Notes (Signed)
Medical screening examination/treatment/procedure(s) were performed by non-physician practitioner and as supervising physician I was immediately available for consultation/collaboration.  EKG Interpretation   None         Shawntavia Saunders J Atiyana Welte, MD 11/24/12 1054 

## 2012-11-28 ENCOUNTER — Emergency Department (HOSPITAL_COMMUNITY)
Admission: EM | Admit: 2012-11-28 | Discharge: 2012-11-28 | Disposition: A | Discharge status: Home / Self Care | Payer: BC Managed Care – PPO | Attending: Emergency Medicine | Admitting: Emergency Medicine

## 2012-11-28 ENCOUNTER — Encounter (HOSPITAL_COMMUNITY): Payer: Self-pay | Admitting: Emergency Medicine

## 2012-11-28 ENCOUNTER — Emergency Department (HOSPITAL_COMMUNITY): Payer: BC Managed Care – PPO

## 2012-11-28 DIAGNOSIS — Z87442 Personal history of urinary calculi: Secondary | ICD-10-CM | POA: Insufficient documentation

## 2012-11-28 DIAGNOSIS — R110 Nausea: Secondary | ICD-10-CM | POA: Insufficient documentation

## 2012-11-28 DIAGNOSIS — F3289 Other specified depressive episodes: Secondary | ICD-10-CM | POA: Insufficient documentation

## 2012-11-28 DIAGNOSIS — F329 Major depressive disorder, single episode, unspecified: Secondary | ICD-10-CM | POA: Insufficient documentation

## 2012-11-28 DIAGNOSIS — R109 Unspecified abdominal pain: Secondary | ICD-10-CM | POA: Insufficient documentation

## 2012-11-28 DIAGNOSIS — R39198 Other difficulties with micturition: Secondary | ICD-10-CM | POA: Insufficient documentation

## 2012-11-28 DIAGNOSIS — K219 Gastro-esophageal reflux disease without esophagitis: Secondary | ICD-10-CM | POA: Insufficient documentation

## 2012-11-28 DIAGNOSIS — N201 Calculus of ureter: Secondary | ICD-10-CM

## 2012-11-28 DIAGNOSIS — Z79899 Other long term (current) drug therapy: Secondary | ICD-10-CM | POA: Insufficient documentation

## 2012-11-28 LAB — CBC WITH DIFFERENTIAL/PLATELET
Basophils Absolute: 0 10*3/uL (ref 0.0–0.1)
Basophils Relative: 0 % (ref 0–1)
Eosinophils Absolute: 0.1 10*3/uL (ref 0.0–0.7)
Eosinophils Relative: 1 % (ref 0–5)
HCT: 47.4 % (ref 39.0–52.0)
Hemoglobin: 17.2 g/dL — ABNORMAL HIGH (ref 13.0–17.0)
Lymphocytes Relative: 33 % (ref 12–46)
Lymphs Abs: 3.3 10*3/uL (ref 0.7–4.0)
MCH: 30.3 pg (ref 26.0–34.0)
MCHC: 36.3 g/dL — ABNORMAL HIGH (ref 30.0–36.0)
MCV: 83.5 fL (ref 78.0–100.0)
Monocytes Absolute: 1.2 10*3/uL — ABNORMAL HIGH (ref 0.1–1.0)
Monocytes Relative: 12 % (ref 3–12)
Neutro Abs: 5.4 10*3/uL (ref 1.7–7.7)
Neutrophils Relative %: 54 % (ref 43–77)
Platelets: 348 10*3/uL (ref 150–400)
RBC: 5.68 MIL/uL (ref 4.22–5.81)
RDW: 13.1 % (ref 11.5–15.5)
WBC: 10.1 10*3/uL (ref 4.0–10.5)

## 2012-11-28 LAB — URINALYSIS, ROUTINE W REFLEX MICROSCOPIC
Bilirubin Urine: NEGATIVE
Glucose, UA: NEGATIVE mg/dL
Hgb urine dipstick: NEGATIVE
Ketones, ur: 40 mg/dL — AB
Leukocytes, UA: NEGATIVE
Nitrite: NEGATIVE
Protein, ur: NEGATIVE mg/dL
Specific Gravity, Urine: 1.03 (ref 1.005–1.030)
Urobilinogen, UA: 0.2 mg/dL (ref 0.0–1.0)
pH: 5.5 (ref 5.0–8.0)

## 2012-11-28 LAB — COMPREHENSIVE METABOLIC PANEL
ALT: 63 U/L — ABNORMAL HIGH (ref 0–53)
AST: 41 U/L — ABNORMAL HIGH (ref 0–37)
Albumin: 5 g/dL (ref 3.5–5.2)
Alkaline Phosphatase: 73 U/L (ref 39–117)
BUN: 19 mg/dL (ref 6–23)
CO2: 23 mEq/L (ref 19–32)
Calcium: 10.2 mg/dL (ref 8.4–10.5)
Chloride: 101 mEq/L (ref 96–112)
Creatinine, Ser: 0.84 mg/dL (ref 0.50–1.35)
GFR calc Af Amer: >90 mL/min (ref >90)
GFR calc non Af Amer: >90 mL/min (ref >90)
Glucose, Bld: 96 mg/dL (ref 70–99)
Potassium: 3.8 mEq/L (ref 3.5–5.1)
Sodium: 139 mEq/L (ref 135–145)
Total Bilirubin: 0.9 mg/dL (ref 0.3–1.2)
Total Protein: 8 g/dL (ref 6.0–8.3)

## 2012-11-28 MED ORDER — DOXYCYCLINE HYCLATE 100 MG PO TABS: 100.0000 mg | ORAL_TABLET | Freq: Once | ORAL | Status: DC

## 2012-11-28 MED ORDER — IBUPROFEN 400 MG PO TABS: 400.0000 mg | ORAL_TABLET | Freq: Four times a day (QID) | ORAL | Status: DC | PRN

## 2012-11-28 MED ORDER — CEFTRIAXONE SODIUM 250 MG IJ SOLR: 250.0000 mg | Freq: Once | INTRAMUSCULAR | Status: DC

## 2012-11-28 MED ORDER — HYDROMORPHONE HCL PF 2 MG/ML IJ SOLN
2.0000 mg | Freq: Once | INTRAMUSCULAR | Status: AC
  Administered 2012-11-28: 2 mg via INTRAMUSCULAR
  Filled 2012-11-28: qty 1

## 2012-11-28 MED ORDER — KETOROLAC TROMETHAMINE 30 MG/ML IJ SOLN
60.0000 mg | Freq: Once | INTRAMUSCULAR | Status: AC
  Administered 2012-11-28: 60 mg via INTRAMUSCULAR
  Filled 2012-11-28: qty 2

## 2012-11-28 MED ORDER — HYDROCODONE-ACETAMINOPHEN 5-325 MG PO TABS: 1.0000 | ORAL_TABLET | Freq: Four times a day (QID) | ORAL | Status: DC | PRN

## 2012-11-28 MED ORDER — ONDANSETRON 8 MG PO TBDP: 8.0000 mg | ORAL_TABLET | Freq: Three times a day (TID) | ORAL | Status: DC | PRN

## 2012-11-28 MED ORDER — ONDANSETRON 8 MG PO TBDP
8.0000 mg | ORAL_TABLET | Freq: Once | ORAL | Status: AC
  Administered 2012-11-28: 8 mg via ORAL
  Filled 2012-11-28: qty 1

## 2012-11-28 MED ORDER — IBUPROFEN 200 MG PO TABS: 600.0000 mg | ORAL_TABLET | Freq: Once | ORAL | Status: DC

## 2012-11-28 NOTE — ED Notes (Signed)
Pt reports a hx of kidney stones where pain started 30 minutes ago. Pt reports a stabbing pain in back. Pt states that he has not noticed any blood in urine but has notice a decrease in urine amount. Nausea on board.

## 2012-11-28 NOTE — ED Provider Notes (Signed)
CSN: [id number]     Arrival date & time 11/28/12  1856 History   First MD Initiated Contact with Patient 11/28/12 2103     Chief Complaint  Patient presents with  . Flank Pain  . Groin Pain   (Consider location/radiation/quality/duration/timing/severity/associated sxs/prior Treatment) HPI Comments: 32 y/o with hx of renal stones comes in with cc of groin pain and bilateral flank pain. Pt states that he started having sudden onset pain about 45 minutes ago, radiating to his groin - very typical of his renal stones. He has had multiple renal stones and required a couple of procedures to get the stones removed. He sees a Insurance underwriter at [facility]. Pt has pain meds with him. + nausea, no uti like sx and no chills.  Patient is a 32 y.o. male presenting with flank pain and groin pain. The history is provided by the patient.  Flank Pain Pertinent negatives include no chest pain, no abdominal pain and no shortness of breath.  Groin Pain Pertinent negatives include no chest pain, no abdominal pain and no shortness of breath.    Past Medical History  Diagnosis Date  . GERD (gastroesophageal reflux disease)   . History of kidney stones   . Seizures Childhood  . Depression     and panic/anxiety  . Insomnia   . Kidney stones    Past Surgical History  Procedure Laterality Date  . Lithotripsy      four times over the years; most recent 10/08/12  . Appendectomy  2011  . Laparoscopic cholecystectomy  1996  . Tonsillectomy  1992  . Wrist ganglion excision  2002    Right  . Hydrocele excision Left 06/2012   Family History  Problem Relation Age of Onset  . Stroke Mother   . Liver disease Mother   . Irritable bowel syndrome Mother   . Kidney disease Mother   . Heart disease Father   . Colon polyps Father   . Colon cancer Neg Hx    History  Substance Use Topics  . Smoking status: Never Smoker   . Smokeless tobacco: Never Used  . Alcohol Use: Yes     Comment: rarely    Review  of Systems  Constitutional: Negative for activity change and appetite change.  Respiratory: Negative for cough and shortness of breath.   Cardiovascular: Negative for chest pain.  Gastrointestinal: Positive for nausea. Negative for abdominal pain.  Genitourinary: Positive for flank pain and decreased urine volume. Negative for dysuria, frequency, hematuria, scrotal swelling and testicular pain.    Allergies  Oxycodone  Home Medications   Current Outpatient Rx  Name  Route  Sig  Dispense  Refill  . Hydrocodone-Acetaminophen 10-300 MG TABS   Oral   Take 1 tablet by mouth every 8 (eight) hours as needed.   90 each   0     Fill on/after 11/22/12.   Marland Kitchen ondansetron (ZOFRAN-ODT) 4 MG disintegrating tablet   Oral   Take 4 mg by mouth every 4 (four) hours as needed for nausea or vomiting.         . pantoprazole (PROTONIX) 40 MG tablet   Oral   Take 40 mg by mouth 2 (two) times daily.         . promethazine (PHENERGAN) 25 MG tablet   Oral   Take 25 mg by mouth every 6 (six) hours as needed for nausea.         . sucralfate (CARAFATE) 1 G tablet  Oral   Take 1 tablet (1 g total) by mouth 4 (four) times daily -  before meals and at bedtime.   120 tablet   1   . tamsulosin (FLOMAX) 0.4 MG CAPS capsule   Oral   Take 1 capsule (0.4 mg total) by mouth daily after breakfast.   10 capsule   0   . vitamin C (ASCORBIC ACID) 500 MG tablet   Oral   Take 1,000 mg by mouth daily.          Marland Kitchen zolpidem (AMBIEN) 10 MG tablet   Oral   Take 1 tablet (10 mg total) by mouth at bedtime.   30 tablet   1    BP 148/119  Pulse 112  Temp(Src) 97.6 F (36.4 C) (Oral)  Resp 20  SpO2 98% Physical Exam  Nursing note and vitals reviewed. Constitutional: He is oriented to person, place, and time. He appears well-developed.  HENT:  Head: Normocephalic and atraumatic.  Eyes: Conjunctivae and EOM are normal. Pupils are equal, round, and reactive to light.  Neck: Normal range of motion.  Neck supple.  Cardiovascular: Normal rate and regular rhythm.   Pulmonary/Chest: Effort normal and breath sounds normal.  Abdominal: Soft. Bowel sounds are normal. He exhibits no distension. There is no tenderness. There is no rebound and no guarding.  Neurological: He is alert and oriented to person, place, and time.  Skin: Skin is warm.    ED Course  Procedures (including critical care time) Labs Review Labs Reviewed  CBC WITH DIFFERENTIAL - Abnormal; Notable for the following:    Hemoglobin 17.2 (*)    MCHC 36.3 (*)    Monocytes Absolute 1.2 (*)    All other components within normal limits  COMPREHENSIVE METABOLIC PANEL - Abnormal; Notable for the following:    AST 41 (*)    ALT 63 (*)    All other components within normal limits  URINE CULTURE  URINALYSIS, ROUTINE W REFLEX MICROSCOPIC   Imaging Review No results found.  EKG Interpretation   None       MDM  No diagnosis found.  PT comes in with cc of flank pain. Pt has hx of renal stones and his sx are very similar to renal stones. UA ordered, Cr is WNL and [place] is pending to ensure there is no hydronephrosis. Pain control with IM meds for now. Pt's nausea responded to ODT meds.  Derwood Kaplan, MD 11/28/12 2123

## 2012-11-28 NOTE — ED Notes (Signed)
Bed: AV40 Expected date:  Expected time:  Means of arrival:  Comments: Hold for [employer]

## 2012-11-28 NOTE — ED Notes (Signed)
US in progress

## 2012-11-29 ENCOUNTER — Other Ambulatory Visit: Payer: Self-pay

## 2012-11-29 LAB — URINE CULTURE
Colony Count: NO GROWTH
Culture: NO GROWTH

## 2012-12-14 ENCOUNTER — Encounter: Payer: Self-pay | Admitting: Family Medicine

## 2012-12-14 ENCOUNTER — Ambulatory Visit (INDEPENDENT_AMBULATORY_CARE_PROVIDER_SITE_OTHER): Payer: BC Managed Care – PPO | Admitting: Family Medicine

## 2012-12-14 VITALS — BP 122/84 | HR 80 | Temp 97.8°F | Wt 290.8 lb

## 2012-12-14 DIAGNOSIS — J069 Acute upper respiratory infection, unspecified: Secondary | ICD-10-CM

## 2012-12-14 DIAGNOSIS — R1030 Lower abdominal pain, unspecified: Secondary | ICD-10-CM

## 2012-12-14 DIAGNOSIS — R109 Unspecified abdominal pain: Secondary | ICD-10-CM

## 2012-12-14 MED ORDER — AZITHROMYCIN 250 MG PO TABS: ORAL_TABLET | ORAL | Status: DC

## 2012-12-14 MED ORDER — HYDROCODONE-ACETAMINOPHEN 10-300 MG PO TABS: 1.0000 | ORAL_TABLET | Freq: Three times a day (TID) | ORAL | Status: DC | PRN

## 2012-12-14 NOTE — Patient Instructions (Signed)
Given the duration of your illness, then I would start the antibiotics in another day or two if you aren't better.  Let me know if you don't improve.

## 2012-12-14 NOTE — Assessment & Plan Note (Signed)
Nontoxic, likely viral.  [facility] rx for abx at this point. Supportive care in meantime and f/u prn. Ctab.  He agrees.

## 2012-12-14 NOTE — Assessment & Plan Note (Signed)
He'll f/u with uro in the meantime.  Continue pain med as is.  No ADE on med

## 2012-12-14 NOTE — Progress Notes (Signed)
Pre-visit discussion using our clinic review tool. No additional management support is needed unless otherwise documented below in the visit note.  He just passed his renal stones, will take them to uro at the follow up appointment.  Has testicular surgery possibly planned for 12/2012; this is to be discussed at the uro appointment.  He continues to have testicular pain.    duration of symptoms: >1 week Fatigue: yes rhinorrhea:yes congestion:yes ear pain: prev yes sore throat:yes Cough:yes, worse in AM, some bloody sputum myalgias:yes other concerns: known sick exposures Post nasal gtt: yes Some vertigo sx.   ROS: See HPI.  Otherwise negative.    Meds, vitals, and allergies reviewed.   GEN: nad, alert and oriented HEENT: mucous membranes moist, TM w/o erythema, nasal epithelium injected, OP with cobblestoning, sinuses not ttp NECK: supple w/o LA CV: rrr. PULM: ctab, no inc wob ABD: soft, +bs EXT: no edema

## 2012-12-22 ENCOUNTER — Other Ambulatory Visit: Payer: Self-pay | Admitting: Internal Medicine

## 2012-12-24 ENCOUNTER — Encounter: Payer: Self-pay | Admitting: Family Medicine

## 2013-01-01 ENCOUNTER — Ambulatory Visit (INDEPENDENT_AMBULATORY_CARE_PROVIDER_SITE_OTHER): Payer: BC Managed Care – PPO | Admitting: Family Medicine

## 2013-01-01 ENCOUNTER — Encounter: Payer: Self-pay | Admitting: Family Medicine

## 2013-01-01 VITALS — BP 130/88 | HR 83 | Temp 98.0°F | Wt 293.8 lb

## 2013-01-01 DIAGNOSIS — G4700 Insomnia, unspecified: Secondary | ICD-10-CM

## 2013-01-01 DIAGNOSIS — R109 Unspecified abdominal pain: Secondary | ICD-10-CM

## 2013-01-01 DIAGNOSIS — R1030 Lower abdominal pain, unspecified: Secondary | ICD-10-CM

## 2013-01-01 MED ORDER — TAMSULOSIN HCL 0.4 MG PO CAPS: 0.4000 mg | ORAL_CAPSULE | Freq: Every day | ORAL | Status: DC

## 2013-01-01 MED ORDER — HYDROCODONE-ACETAMINOPHEN 10-325 MG PO TABS: 1.5000 | ORAL_TABLET | Freq: Four times a day (QID) | ORAL | Status: DC | PRN

## 2013-01-01 MED ORDER — ZOLPIDEM TARTRATE 10 MG PO TABS: 10.0000 mg | ORAL_TABLET | Freq: Every day | ORAL | Status: DC

## 2013-01-01 NOTE — Patient Instructions (Signed)
Keep using the pain medicine and I'll await the update from urology.  Restart flomax in the meantime.  Take care.

## 2013-01-01 NOTE — Progress Notes (Signed)
Pre-visit discussion using our clinic review tool. No additional management support is needed unless otherwise documented below in the visit note.  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  The path is pending, at a second eval with pathology now.  He still has pain on the left side.  He had a fluid accumulation that had to be manually "broken up" at last uro visit yesterday and this was very painful.    He is in the midst of passing stone, pain with urination.  He can't bear down much, due to pain from the orchiectomy.  No blood in urine noted recently.  Not on flomax currently, had run out.  He has to take more hydrocodone in the meantime, ie up to 6 a day, with the post op pain and the stone.  Not sedated from the medicine.   He'll run out of ambien next week.  No ADE.  It helps him sleep.   Meds, vitals, and allergies reviewed.   ROS: See HPI.  Otherwise, noncontributory.  nad but uncomfortable.  Walking slowly due to scrotal pain Affect wnl o/w, speech wnl o/w.  rrr ctab abd soft, healing incision noted, C/D/I.  L scrotal swelling noted on L side, as reported. Not hot but still very ttp Still with medial L thigh numbness.

## 2013-01-02 NOTE — Assessment & Plan Note (Signed)
Continue ambien prn.  He agrees.

## 2013-01-02 NOTE — Assessment & Plan Note (Signed)
Continues with high pain level.  Should improve from orchiectomy but with recent stone to pass.  rx'd hydrocodone again in good faith.  No ADE.  He'll report back as needed.  F/u path pending. I'll await reports.

## 2013-01-18 ENCOUNTER — Encounter: Payer: Self-pay | Admitting: Family Medicine

## 2013-01-18 ENCOUNTER — Other Ambulatory Visit: Payer: Self-pay

## 2013-01-18 MED ORDER — HYDROCODONE-ACETAMINOPHEN 10-325 MG PO TABS: 1.5000 | ORAL_TABLET | Freq: Four times a day (QID) | ORAL | Status: DC | PRN

## 2013-01-18 NOTE — Telephone Encounter (Signed)
Casey Caldwell, CMA placed RX up front for pt to pick up.

## 2013-01-18 NOTE — Telephone Encounter (Signed)
Printed to pick up on Monday.

## 2013-01-18 NOTE — Telephone Encounter (Signed)
Pt left v/m; pt has already sent message via E mail to Dr Para March; pt is returning home from holidays 01/22/13 and pt is still taking hydrocodone for pain averaging 6 per day. Pt is almost out of med.pt request cb.

## 2013-01-21 NOTE — Telephone Encounter (Signed)
Pt will be by office on 01/23/13 by 1 pm to pick up rx. Pt wanted verification rx would be ready. Advised at front desk for pick up.

## 2013-01-24 HISTORY — PX: SPINAL CORD STIMULATOR INSERTION: SHX5378

## 2013-02-04 ENCOUNTER — Encounter (HOSPITAL_COMMUNITY): Payer: Self-pay | Admitting: Emergency Medicine

## 2013-02-04 ENCOUNTER — Emergency Department (HOSPITAL_COMMUNITY): Payer: BC Managed Care – PPO

## 2013-02-04 ENCOUNTER — Emergency Department (HOSPITAL_COMMUNITY)
Admission: EM | Admit: 2013-02-04 | Discharge: 2013-02-04 | Disposition: A | Discharge status: Home / Self Care | Payer: BC Managed Care – PPO | Attending: Emergency Medicine | Admitting: Emergency Medicine

## 2013-02-04 DIAGNOSIS — K219 Gastro-esophageal reflux disease without esophagitis: Secondary | ICD-10-CM | POA: Insufficient documentation

## 2013-02-04 DIAGNOSIS — G4700 Insomnia, unspecified: Secondary | ICD-10-CM | POA: Insufficient documentation

## 2013-02-04 DIAGNOSIS — R109 Unspecified abdominal pain: Secondary | ICD-10-CM | POA: Insufficient documentation

## 2013-02-04 DIAGNOSIS — Z8669 Personal history of other diseases of the nervous system and sense organs: Secondary | ICD-10-CM | POA: Insufficient documentation

## 2013-02-04 DIAGNOSIS — Z9079 Acquired absence of other genital organ(s): Secondary | ICD-10-CM | POA: Insufficient documentation

## 2013-02-04 DIAGNOSIS — G8929 Other chronic pain: Secondary | ICD-10-CM | POA: Insufficient documentation

## 2013-02-04 DIAGNOSIS — Z9089 Acquired absence of other organs: Secondary | ICD-10-CM | POA: Insufficient documentation

## 2013-02-04 DIAGNOSIS — Z87442 Personal history of urinary calculi: Secondary | ICD-10-CM | POA: Insufficient documentation

## 2013-02-04 DIAGNOSIS — Z79899 Other long term (current) drug therapy: Secondary | ICD-10-CM | POA: Insufficient documentation

## 2013-02-04 LAB — CBC WITH DIFFERENTIAL/PLATELET
Basophils Absolute: 0 10*3/uL (ref 0.0–0.1)
Basophils Relative: 0 % (ref 0–1)
Eosinophils Absolute: 0.2 10*3/uL (ref 0.0–0.7)
Eosinophils Relative: 3 % (ref 0–5)
HCT: 44.7 % (ref 39.0–52.0)
Hemoglobin: 16.2 g/dL (ref 13.0–17.0)
Lymphocytes Relative: 29 % (ref 12–46)
Lymphs Abs: 2.5 10*3/uL (ref 0.7–4.0)
MCH: 30.9 pg (ref 26.0–34.0)
MCHC: 36.2 g/dL — ABNORMAL HIGH (ref 30.0–36.0)
MCV: 85.3 fL (ref 78.0–100.0)
Monocytes Absolute: 0.9 10*3/uL (ref 0.1–1.0)
Monocytes Relative: 11 % (ref 3–12)
Neutro Abs: 4.9 10*3/uL (ref 1.7–7.7)
Neutrophils Relative %: 57 % (ref 43–77)
Platelets: 298 10*3/uL (ref 150–400)
RBC: 5.24 MIL/uL (ref 4.22–5.81)
RDW: 13.2 % (ref 11.5–15.5)
WBC: 8.5 10*3/uL (ref 4.0–10.5)

## 2013-02-04 LAB — URINE MICROSCOPIC-ADD ON

## 2013-02-04 LAB — URINALYSIS, ROUTINE W REFLEX MICROSCOPIC
Bilirubin Urine: NEGATIVE
Glucose, UA: NEGATIVE mg/dL
Ketones, ur: NEGATIVE mg/dL
Leukocytes, UA: NEGATIVE
Nitrite: NEGATIVE
Protein, ur: NEGATIVE mg/dL
Specific Gravity, Urine: 1.021 (ref 1.005–1.030)
Urobilinogen, UA: 0.2 mg/dL (ref 0.0–1.0)
pH: 8 (ref 5.0–8.0)

## 2013-02-04 LAB — BASIC METABOLIC PANEL
BUN: 10 mg/dL (ref 6–23)
CO2: 23 mEq/L (ref 19–32)
Calcium: 9.2 mg/dL (ref 8.4–10.5)
Chloride: 103 mEq/L (ref 96–112)
Creatinine, Ser: 0.74 mg/dL (ref 0.50–1.35)
GFR calc Af Amer: >90 mL/min (ref >90)
GFR calc non Af Amer: >90 mL/min (ref >90)
Glucose, Bld: 97 mg/dL (ref 70–99)
Potassium: 4.1 mEq/L (ref 3.7–5.3)
Sodium: 138 mEq/L (ref 137–147)

## 2013-02-04 MED ORDER — ONDANSETRON 4 MG PO TBDP: 4.0000 mg | ORAL_TABLET | Freq: Three times a day (TID) | ORAL | Status: DC | PRN

## 2013-02-04 MED ORDER — KETOROLAC TROMETHAMINE 30 MG/ML IJ SOLN
30.0000 mg | Freq: Once | INTRAMUSCULAR | Status: AC
  Administered 2013-02-04: 30 mg via INTRAVENOUS
  Filled 2013-02-04: qty 1

## 2013-02-04 MED ORDER — HYDROMORPHONE HCL PF 1 MG/ML IJ SOLN
1.0000 mg | Freq: Once | INTRAMUSCULAR | Status: AC
  Administered 2013-02-04: 1 mg via INTRAVENOUS
  Filled 2013-02-04: qty 1

## 2013-02-04 MED ORDER — ONDANSETRON HCL 4 MG/2ML IJ SOLN
4.0000 mg | Freq: Once | INTRAMUSCULAR | Status: AC
  Administered 2013-02-04: 4 mg via INTRAVENOUS
  Filled 2013-02-04: qty 2

## 2013-02-04 MED ORDER — SODIUM CHLORIDE 0.9 % IV SOLN
INTRAVENOUS | Status: DC
  Administered 2013-02-04: 16:00:00 via INTRAVENOUS

## 2013-02-04 NOTE — ED Provider Notes (Signed)
Medical screening examination/treatment/procedure(s) were performed by non-physician practitioner and as supervising physician I was immediately available for consultation/collaboration.    Dot Lanes, MD 02/04/13 1710

## 2013-02-04 NOTE — ED Notes (Addendum)
Pt c/o lower back and groin pain x a couple of weeks but worsen yesterday. Hx of kidney stones, states it feels like kidney stones. Pt also c/o nausea. Pt states he had left testicle removed the beginging of Dec , now right testicle is sore.

## 2013-02-04 NOTE — Discharge Instructions (Signed)

## 2013-02-04 NOTE — ED Provider Notes (Signed)
CSN: [id number]     Arrival date & time 02/04/13  1400 History   First MD Initiated Contact with Patient 02/04/13 1534     Chief Complaint  Patient presents with  . Groin Pain  . Back Pain   (Consider location/radiation/quality/duration/timing/severity/associated sxs/prior Treatment) HPI  Casey Caldwell is a 33 y.o.male with a significant PMH of GERDm orchiotomy of left testicle, kidney stones requiring surgical removal presents to the ER with complaints of left flank pain and right testicular pain.  He is always having some pain in his testicular area and intermittent flank pain but for the past few days he describes the pain as "different". He feels as though it is his kidney stones and his at home norco 10mg  are not working. He is taking up to 6 tabs a day. Yesterday he noticed a new nodule to his testicle, close to the base of his penis that is very painful and swollen. He is unsure if he has infection to his testicle or is passing a kidney stone.   Past Medical History  Diagnosis Date  . GERD (gastroesophageal reflux disease)   . History of kidney stones   . Seizures Childhood  . Depression     and panic/anxiety  . Insomnia   . Kidney stones    Past Surgical History  Procedure Laterality Date  . Lithotripsy      four times over the years; most recent 10/08/12  . Appendectomy  2011  . Laparoscopic cholecystectomy  1996  . Tonsillectomy  1992  . Wrist ganglion excision  2002    Right  . Hydrocele excision Left 06/2012  . Orchiectomy Left 11/2012   Family History  Problem Relation Age of Onset  . Stroke Mother   . Liver disease Mother   . Irritable bowel syndrome Mother   . Kidney disease Mother   . Heart disease Father   . Colon polyps Father   . Colon cancer Neg Hx    History  Substance Use Topics  . Smoking status: Never Smoker   . Smokeless tobacco: Never Used  . Alcohol Use: Yes     Comment: rarely    Review of Systems The patient denies anorexia, fever,  weight loss,, vision loss, decreased hearing, hoarseness, chest pain, syncope, dyspnea on exertion, peripheral edema, balance deficits, hemoptysis, abdominal pain, melena, hematochezia, severe indigestion/heartburn, hematuria, incontinence, genital sores, muscle weakness, suspicious skin lesions, transient blindness, difficulty walking, depression, unusual weight change, abnormal bleeding, enlarged lymph nodes, angioedema, and breast masses.  Allergies  Oxycodone  Home Medications   Current Outpatient Rx  Name  Route  Sig  Dispense  Refill  . HYDROcodone-acetaminophen (NORCO) 10-325 MG per tablet   Oral   Take 1.5 tablets by mouth every 6 (six) hours as needed for severe pain.   100 tablet   0   . ibuprofen (ADVIL,MOTRIN) 400 MG tablet   Oral   Take 1 tablet (400 mg total) by mouth every 6 (six) hours as needed.   30 tablet   0   . pantoprazole (PROTONIX) 40 MG tablet   Oral   Take 40 mg by mouth 2 (two) times daily.         Marland Kitchen zolpidem (AMBIEN) 10 MG tablet   Oral   Take 1 tablet (10 mg total) by mouth at bedtime. Fill on/after 01/08/13   30 tablet   1   . ondansetron (ZOFRAN ODT) 4 MG disintegrating tablet   Oral   Take 1 tablet (  4 mg total) by mouth every 8 (eight) hours as needed for nausea or vomiting.   20 tablet   0    BP 130/89  Pulse 81  Temp(Src) 98.2 F (36.8 C) (Oral)  Resp 19  SpO2 99% Physical Exam  Nursing note and vitals reviewed. Constitutional: He appears well-developed and well-nourished. No distress.  HENT:  Head: Normocephalic and atraumatic.  Eyes: Pupils are equal, round, and reactive to light.  Neck: Normal range of motion. Neck supple.  Cardiovascular: Normal rate and regular rhythm.   Pulmonary/Chest: Effort normal.  Abdominal: Soft. There is CVA tenderness (left).  Genitourinary: Penis normal. Right testis shows mass and tenderness. Right testis shows no swelling. Right testis is descended. Cremasteric reflex is not absent on the right  side.  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  Skin: Skin is warm and dry.    ED Course  Procedures (including critical care time) Labs Review Labs Reviewed  URINALYSIS, ROUTINE W REFLEX MICROSCOPIC - Abnormal; Notable for the following:    Hgb urine dipstick SMALL (*)    All other components within normal limits  CBC WITH DIFFERENTIAL - Abnormal; Notable for the following:    MCHC 36.2 (*)    All other components within normal limits  BASIC METABOLIC PANEL  URINE MICROSCOPIC-ADD ON   Imaging Review US Scrotum  02/04/2013   CLINICAL DATA:  Right testicular pain.  History a left orchiectomy.  EXAM: SCROTAL ULTRASOUND  DOPPLER ULTRASOUND OF THE TESTICLES  TECHNIQUE: Complete ultrasound examination of the testicles, epididymis, and other scrotal structures was performed. Color and spectral Doppler ultrasound were also utilized to evaluate blood flow to the testicles.  COMPARISON:  None.  FINDINGS: Right testicle  Measurements: 4.4 x 2.1 x 2.1 cm. No mass or microlithiasis visualized.  Left testicle  Measurements:  Absent.  Right epididymis:  Normal in size and appearance.  Left epididymis:  Normal in size and appearance.  Hydrocele:  None visualized.  Varicocele:  None visualized.  Pulsed Doppler interrogation of both testes demonstrates low resistance arterial and venous waveforms within the right testicle.  IMPRESSION: No evidence of torsion involving the right testicle.  Surgical absence of the left testicle.   Electronically Signed   By: Maryclare Bean M.D.   On: 02/04/2013 16:39   US Renal  02/04/2013   CLINICAL DATA:  Left flank pain  EXAM: RENAL/URINARY TRACT ULTRASOUND COMPLETE  COMPARISON:  11/28/2012  FINDINGS: Right Kidney:  Length: 12.7 cm in length. Echogenicity within normal limits. No mass or hydronephrosis visualized.  Left Kidney:  Length: 13.4 cm in length. Echogenicity within normal limits. No mass or hydronephrosis visualized.  Bladder:  Bilateral ureteral  jets are identified.  IMPRESSION: Within normal limits.   Electronically Signed   By: Maryclare Bean M.D.   On: 02/04/2013 16:37   [place] Art/ven Flow Abd Pelv Doppler  02/04/2013   CLINICAL DATA:  Right testicular pain.  History a left orchiectomy.  EXAM: SCROTAL ULTRASOUND  DOPPLER ULTRASOUND OF THE TESTICLES  TECHNIQUE: Complete ultrasound examination of the testicles, epididymis, and other scrotal structures was performed. Color and spectral Doppler ultrasound were also utilized to evaluate blood flow to the testicles.  COMPARISON:  None.  FINDINGS: Right testicle  Measurements: 4.4 x 2.1 x 2.1 cm. No mass or microlithiasis visualized.  Left testicle  Measurements:  Absent.  Right epididymis:  Normal in size and appearance.  Left epididymis:  Normal in size and appearance.  Hydrocele:  None  visualized.  Varicocele:  None visualized.  Pulsed Doppler interrogation of both testes demonstrates low resistance arterial and venous waveforms within the right testicle.  IMPRESSION: No evidence of torsion involving the right testicle.  Surgical absence of the left testicle.   Electronically Signed   By: Maryclare Bean M.D.   On: 02/04/2013 16:39    EKG Interpretation   None       MDM   1. Left flank pain, chronic      Concern for epididymitis to right testicle vs kidney stone with referred pain. Will give fluids and pain medication Will check labs, UA, renal US and scrotal US.   Labs and images are reassuring. Pt has some trace hematuria with is chronic. US shows no epididymitis, torsion or hydronephrosis. He see's a Dr. Hazle Nordmann (sp) in [facility] for [facility]rology. HE say he can get in to be seen within a day or 2. Discussed case with Dr. Audie Pinto before dc. Pain moderately controlled with IV Toradol and IV Dilaudid.  33 y.o.Rolondo Pierre Knipfer's evaluation in the Emergency Department is complete. It has been determined that no acute conditions requiring further emergency intervention are present at this time. The  patient/guardian have been advised of the diagnosis and plan. We have discussed signs and symptoms that warrant return to the ED, such as changes or worsening in symptoms.  Vital signs are stable at discharge. Filed Vitals:   02/04/13 1406  BP: 130/89  Pulse: 81  Temp: 98.2 F (36.8 C)  Resp: 19    Patient/guardian has voiced understanding and agreed to follow-up with the PCP or specialist.    Linus Mako, PA-C 02/04/13 1700

## 2013-02-12 ENCOUNTER — Encounter: Payer: Self-pay | Admitting: Family Medicine

## 2013-02-12 ENCOUNTER — Ambulatory Visit (INDEPENDENT_AMBULATORY_CARE_PROVIDER_SITE_OTHER): Payer: BC Managed Care – PPO | Admitting: Family Medicine

## 2013-02-12 VITALS — BP 124/88 | HR 83 | Temp 97.7°F | Wt 293.5 lb

## 2013-02-12 DIAGNOSIS — R109 Unspecified abdominal pain: Secondary | ICD-10-CM

## 2013-02-12 DIAGNOSIS — R1030 Lower abdominal pain, unspecified: Secondary | ICD-10-CM

## 2013-02-12 MED ORDER — ONDANSETRON 4 MG PO TBDP: 4.0000 mg | ORAL_TABLET | Freq: Three times a day (TID) | ORAL | Status: DC | PRN

## 2013-02-12 MED ORDER — HYDROCODONE-ACETAMINOPHEN 10-325 MG PO TABS: 1.0000 | ORAL_TABLET | Freq: Three times a day (TID) | ORAL | Status: DC | PRN

## 2013-02-12 NOTE — Progress Notes (Signed)
Pre-visit discussion using our clinic review tool. No additional management support is needed unless otherwise documented below in the visit note.  His stepmother recently died and an in-law is likely to pass away soon.  He is likely going to go back to [place] to be with family soon.  His pain is improved recently.  He didn't have an ER visit recently, but is better in the meantime.  He is using about 2 vicodin a day now, much less than prev.  His swelling is better but the scrotum is still tender. The numbness in the L upper thigh is "waking up some".  Per patient, the second opinion on the path was benign, but I don't have the report yet.    He has had stone analysis with uro for stones.  He isn't passing one currently. We talked about inc in fluid intake. He has been sedentary with travel and the pain above.    Meds, vitals, and allergies reviewed.   ROS: See HPI.  Otherwise, noncontributory.  nad but uncomfortable- though improved from prev.  R testicle descended without nodularity, L absent, not specifically tender and no masses but still uncomfortable with movement. No scrotal masses or lesions. Scrotal swelling is improved, area is well healed. No penis lesions or urethral discharge.

## 2013-02-12 NOTE — Patient Instructions (Signed)
Take care.  Try to wean off the hydrocodone as the pain allows.  Glad to see you.  I'll await your urology note from the follow up visit.

## 2013-02-13 NOTE — Assessment & Plan Note (Signed)
Less vicodin use now.  Would expect more progress in the next 1-2 months.  He'll try to taper of pain meds a pain allows.  He agrees.  rx printed.  Path neg per patient.  I'll await f/u uro notes.  He can use zofran prn during pain flares/episodes of nausea.

## 2013-02-20 ENCOUNTER — Encounter: Payer: Self-pay | Admitting: Family Medicine

## 2013-02-27 ENCOUNTER — Emergency Department (HOSPITAL_COMMUNITY)
Admission: EM | Admit: 2013-02-27 | Discharge: 2013-02-27 | Disposition: A | Discharge status: Home / Self Care | Payer: BC Managed Care – PPO | Attending: Emergency Medicine | Admitting: Emergency Medicine

## 2013-02-27 ENCOUNTER — Encounter (HOSPITAL_COMMUNITY): Payer: Self-pay | Admitting: Emergency Medicine

## 2013-02-27 ENCOUNTER — Emergency Department (HOSPITAL_COMMUNITY): Payer: BC Managed Care – PPO

## 2013-02-27 DIAGNOSIS — Z79899 Other long term (current) drug therapy: Secondary | ICD-10-CM | POA: Insufficient documentation

## 2013-02-27 DIAGNOSIS — Z8669 Personal history of other diseases of the nervous system and sense organs: Secondary | ICD-10-CM | POA: Insufficient documentation

## 2013-02-27 DIAGNOSIS — G4700 Insomnia, unspecified: Secondary | ICD-10-CM | POA: Insufficient documentation

## 2013-02-27 DIAGNOSIS — K219 Gastro-esophageal reflux disease without esophagitis: Secondary | ICD-10-CM | POA: Insufficient documentation

## 2013-02-27 DIAGNOSIS — Z9079 Acquired absence of other genital organ(s): Secondary | ICD-10-CM | POA: Insufficient documentation

## 2013-02-27 DIAGNOSIS — G8929 Other chronic pain: Secondary | ICD-10-CM | POA: Insufficient documentation

## 2013-02-27 DIAGNOSIS — R52 Pain, unspecified: Secondary | ICD-10-CM | POA: Insufficient documentation

## 2013-02-27 DIAGNOSIS — Z8659 Personal history of other mental and behavioral disorders: Secondary | ICD-10-CM | POA: Insufficient documentation

## 2013-02-27 DIAGNOSIS — N200 Calculus of kidney: Secondary | ICD-10-CM

## 2013-02-27 LAB — URINALYSIS, ROUTINE W REFLEX MICROSCOPIC
Bilirubin Urine: NEGATIVE
Glucose, UA: NEGATIVE mg/dL
Hgb urine dipstick: NEGATIVE
Ketones, ur: NEGATIVE mg/dL
Leukocytes, UA: NEGATIVE
Nitrite: NEGATIVE
Protein, ur: NEGATIVE mg/dL
Specific Gravity, Urine: 1.024 (ref 1.005–1.030)
Urobilinogen, UA: 0.2 mg/dL (ref 0.0–1.0)
pH: 5.5 (ref 5.0–8.0)

## 2013-02-27 LAB — CBC
HCT: 43.9 % (ref 39.0–52.0)
Hemoglobin: 15.5 g/dL (ref 13.0–17.0)
MCH: 30 pg (ref 26.0–34.0)
MCHC: 35.3 g/dL (ref 30.0–36.0)
MCV: 84.9 fL (ref 78.0–100.0)
Platelets: 282 10*3/uL (ref 150–400)
RBC: 5.17 MIL/uL (ref 4.22–5.81)
RDW: 13.1 % (ref 11.5–15.5)
WBC: 8.5 10*3/uL (ref 4.0–10.5)

## 2013-02-27 LAB — BASIC METABOLIC PANEL
BUN: 12 mg/dL (ref 6–23)
CO2: 25 mEq/L (ref 19–32)
Calcium: 9.7 mg/dL (ref 8.4–10.5)
Chloride: 101 mEq/L (ref 96–112)
Creatinine, Ser: 0.73 mg/dL (ref 0.50–1.35)
GFR calc Af Amer: >90 mL/min (ref >90)
GFR calc non Af Amer: >90 mL/min (ref >90)
Glucose, Bld: 94 mg/dL (ref 70–99)
Potassium: 3.8 mEq/L (ref 3.7–5.3)
Sodium: 140 mEq/L (ref 137–147)

## 2013-02-27 MED ORDER — HYDROMORPHONE HCL PF 1 MG/ML IJ SOLN
1.0000 mg | Freq: Once | INTRAMUSCULAR | Status: AC
  Administered 2013-02-27: 1 mg via INTRAVENOUS
  Filled 2013-02-27: qty 1

## 2013-02-27 MED ORDER — HYDROMORPHONE HCL PF 1 MG/ML IJ SOLN: 1.0000 mg | Freq: Once | INTRAMUSCULAR | Status: DC

## 2013-02-27 MED ORDER — ONDANSETRON HCL 4 MG/2ML IJ SOLN
4.0000 mg | Freq: Once | INTRAMUSCULAR | Status: AC
  Administered 2013-02-27: 4 mg via INTRAVENOUS
  Filled 2013-02-27: qty 2

## 2013-02-27 MED ORDER — SODIUM CHLORIDE 0.9 % IV BOLUS (SEPSIS)
500.0000 mL | Freq: Once | INTRAVENOUS | Status: AC
Start: 2013-02-27 — End: 2013-02-27
  Administered 2013-02-27: 500 mL via INTRAVENOUS

## 2013-02-27 NOTE — Discharge Instructions (Signed)
Kidney Stones  Kidney stones (urolithiasis) are deposits that form inside your kidneys. The intense pain is caused by the stone moving through the urinary tract. When the stone moves, the ureter goes into spasm around the stone. The stone is usually passed in the urine.   CAUSES   · A disorder that makes certain neck glands produce too much parathyroid hormone (primary hyperparathyroidism).  · A buildup of uric acid crystals, similar to gout in your joints.  · Narrowing (stricture) of the ureter.  · A kidney obstruction present at birth (congenital obstruction).  · Previous surgery on the kidney or ureters.  · Numerous kidney infections.  SYMPTOMS   · Feeling sick to your stomach (nauseous).  · Throwing up (vomiting).  · Blood in the urine (hematuria).  · Pain that usually spreads (radiates) to the groin.  · Frequency or urgency of urination.  DIAGNOSIS   · Taking a history and physical exam.  · Blood or urine tests.  · CT scan.  · Occasionally, an examination of the inside of the urinary bladder (cystoscopy) is performed.  TREATMENT   · Observation.  · Increasing your fluid intake.  · Extracorporeal shock wave lithotripsy This is a noninvasive procedure that uses shock waves to break up kidney stones.  · Surgery may be needed if you have severe pain or persistent obstruction. There are various surgical procedures. Most of the procedures are performed with the use of small instruments. Only small incisions are needed to accommodate these instruments, so recovery time is minimized.  The size, location, and chemical composition are all important variables that will determine the proper choice of action for you. Talk to your health care provider to better understand your situation so that you will minimize the risk of injury to yourself and your kidney.   HOME CARE INSTRUCTIONS   · Drink enough water and fluids to keep your urine clear or pale yellow. This will help you to pass the stone or stone fragments.  · Strain  all urine through the provided strainer. Keep all particulate matter and stones for your health care provider to see. The stone causing the pain may be as small as a grain of salt. It is very important to use the strainer each and every time you pass your urine. The collection of your stone will allow your health care provider to analyze it and verify that a stone has actually passed. The stone analysis will often identify what you can do to reduce the incidence of recurrences.  · Only take over-the-counter or prescription medicines for pain, discomfort, or fever as directed by your health care provider.  · Make a follow-up appointment with your health care provider as directed.  · Get follow-up X-rays if required. The absence of pain does not always mean that the stone has passed. It may have only stopped moving. If the urine remains completely obstructed, it can cause loss of kidney function or even complete destruction of the kidney. It is your responsibility to make sure X-rays and follow-ups are completed. Ultrasounds of the kidney can show blockages and the status of the kidney. Ultrasounds are not associated with any radiation and can be performed easily in a matter of minutes.  SEEK MEDICAL CARE IF:  · You experience pain that is progressive and unresponsive to any pain medicine you have been prescribed.  SEEK IMMEDIATE MEDICAL CARE IF:   · Pain cannot be controlled with the prescribed medicine.  · You have a fever   or shaking chills.  · The severity or intensity of pain increases over 18 hours and is not relieved by pain medicine.  · You develop a new onset of abdominal pain.  · You feel faint or pass out.  · You are unable to urinate.  MAKE SURE YOU:   · Understand these instructions.  · Will watch your condition.  · Will get help right away if you are not doing well or get worse.  Document Released: 01/10/2005 Document Revised: 09/12/2012 Document Reviewed: 06/13/2012  [employer]® Patient Information ©2014  [employer].

## 2013-02-27 NOTE — ED Provider Notes (Signed)
CSN: [id number]     Arrival date & time 02/27/13  1437 History   First MD Initiated Contact with Patient 02/27/13 1503     Chief Complaint  Patient presents with  . Flank Pain   (Consider location/radiation/quality/duration/timing/severity/associated sxs/prior Treatment) HPI Comments: Hx of multiple types of stones. Followed by Dr. Burnell Blanks in [facility], has appointment tomorrow.  Has had multiple lithotripsies and stents.  Also L radical orchiectomy for chronic epididymitis.   Patient is a 33 y.o. male presenting with flank pain. The history is provided by the patient.  Flank Pain This is a chronic problem. The current episode started more than 1 week ago. The problem occurs constantly. The problem has been gradually worsening. Associated symptoms include abdominal pain. Pertinent negatives include no chest pain, no headaches and no shortness of breath. Nothing aggravates the symptoms. Nothing relieves the symptoms.    Past Medical History  Diagnosis Date  . GERD (gastroesophageal reflux disease)   . History of kidney stones   . Seizures Childhood  . Depression     and panic/anxiety  . Insomnia   . Kidney stones    Past Surgical History  Procedure Laterality Date  . Lithotripsy      four times over the years; most recent 10/08/12  . Appendectomy  2011  . Laparoscopic cholecystectomy  1996  . Tonsillectomy  1992  . Wrist ganglion excision  2002    Right  . Hydrocele excision Left 06/2012  . Orchiectomy Left 11/2012   Family History  Problem Relation Age of Onset  . Stroke Mother   . Liver disease Mother   . Irritable bowel syndrome Mother   . Kidney disease Mother   . Heart disease Father   . Colon polyps Father   . Colon cancer Neg Hx    History  Substance Use Topics  . Smoking status: Never Smoker   . Smokeless tobacco: Never Used  . Alcohol Use: Yes     Comment: rarely    Review of Systems  Respiratory: Negative for shortness of breath.    Cardiovascular: Negative for chest pain.  Gastrointestinal: Positive for abdominal pain.  Genitourinary: Positive for flank pain.  Neurological: Negative for headaches.  All other systems reviewed and are negative.    Allergies  Oxycodone  Home Medications   Current Outpatient Rx  Name  Route  Sig  Dispense  Refill  . HYDROcodone-acetaminophen (NORCO) 10-325 MG per tablet   Oral   Take 1 tablet by mouth 3 (three) times daily as needed for severe pain.   100 tablet   0   . ibuprofen (ADVIL,MOTRIN) 400 MG tablet   Oral   Take 1 tablet (400 mg total) by mouth every 6 (six) hours as needed.   30 tablet   0   . ondansetron (ZOFRAN ODT) 4 MG disintegrating tablet   Oral   Take 1 tablet (4 mg total) by mouth every 8 (eight) hours as needed for nausea or vomiting.   20 tablet   2   . pantoprazole (PROTONIX) 40 MG tablet   Oral   Take 40 mg by mouth 2 (two) times daily.         . tamsulosin (FLOMAX) 0.4 MG CAPS capsule   Oral   Take 0.4 mg by mouth.         . zolpidem (AMBIEN) 10 MG tablet   Oral   Take 1 tablet (10 mg total) by mouth at bedtime. Fill on/after 01/08/13  30 tablet   1    BP 163/103  Pulse 88  Temp(Src) 98.3 F (36.8 C) (Oral)  Resp 18  SpO2 100% Physical Exam  Nursing note and vitals reviewed. Constitutional: He is oriented to person, place, and time. He appears well-developed and well-nourished. No distress.  HENT:  Head: Normocephalic and atraumatic.  Mouth/Throat: No oropharyngeal exudate.  Eyes: EOM are normal. Pupils are equal, round, and reactive to light.  Neck: Normal range of motion. Neck supple.  Cardiovascular: Normal rate and regular rhythm.  Exam reveals no friction rub.   No murmur heard. Pulmonary/Chest: Effort normal and breath sounds normal. No respiratory distress. He has no wheezes. He has no rales.  Abdominal: He exhibits no distension. There is tenderness (bilateral flank, L>R, L CVA tenderness). There is no rebound  and no guarding. Hernia confirmed negative in the right inguinal area.  Genitourinary: Penis normal. Right testis shows tenderness (mild). Right testis shows no mass and no swelling. Left testis shows no tenderness.  L testicle absent  Musculoskeletal: Normal range of motion. He exhibits no edema.  Neurological: He is alert and oriented to person, place, and time. No cranial nerve deficit. He exhibits normal muscle tone.  Skin: He is not diaphoretic.    ED Course  Procedures (including critical care time) Labs Review Labs Reviewed  CBC  BASIC METABOLIC PANEL  URINALYSIS, ROUTINE W REFLEX MICROSCOPIC   Imaging Review Ct Abdomen Pelvis Wo Contrast  02/27/2013   CLINICAL DATA:  Bilateral flank pain  EXAM: CT ABDOMEN AND PELVIS WITHOUT CONTRAST  TECHNIQUE: Multidetector CT imaging of the abdomen and pelvis was performed following the standard protocol without intravenous contrast.  COMPARISON:  CT scan the abdomen pelvis of 19 July 2012.  FINDINGS: The kidneys are normal in contour and exhibit no hydronephrosis. No calcified stones are demonstrated on the right. On the left in the upper pole there is a 2 mm diameter nonobstructing stone. In the mid to lower pole there is a 3 mm diameter nonobstructing stone. There is a 2 mm diameter nonobstructive stone at the left UPJ on image 57 of series 2. Along the course of the ureters no stones are demonstrated. No UVJ stones are demonstrated. The bladder is nondistended. The prostate gland and seminal vesicles appear normal. The psoas musculature is normal in appearance.  The gallbladder is surgically absent. The liver, spleen, pancreas, adrenal glands, and partially distended stomach appear normal for the noncontrast exam. The caliber of the abdominal aorta is normal. The unopacified loops of small and large bowel are normal in appearance. The appendix is surgically absent. There is no intra-abdominal or pelvic lymphadenopathy. There is no evidence of ascites.  There is no inguinal or umbilical hernia.  The lumbar vertebral bodies are preserved in height. The bony pelvis is normal in appearance. The lung bases are clear.  IMPRESSION: 1. There are kidney stones on the left. There is a nonobstructed left UPJ stone measuring 2 mm in diameter. The urinary bladder is normal in appearance. No stones are demonstrated on the right. 2. There is no acute hepatobiliary abnormality nor acute bowel abnormality. 3. There is no intra-abdominal or pelvic lymphadenopathy.   Electronically Signed   By: David  Martinique   On: 02/27/2013 16:03    EKG Interpretation   None       MDM   1. Kidney stone    37M with hx of kidney stones. Presenting with acute on chronic pain. Had renal US recently, was normal, un  hydronephrosis noted. He states worsening of his past for past 2 weeks, but extreme worsening over past 2 days. Denies fevers. Associated nausea, no vomiting. Afebrile, normal vitals otherwise. CVA tenderness on left with bilateral flank pain on palpation. Will check labs, control pain, check CT to look for stones as he feels worsening pain.  CT shows 2 mm stone at L UPJ. Labs normal. Patient has f/u tomorrow. Stable for discharge. Has pain meds, flomax, nausea meds at home.     Osvaldo Shipper, MD 02/27/13 2329

## 2013-02-27 NOTE — ED Notes (Signed)
Per patient, states flank pain on both sides 3 days ago-has appointment with urologist tomorrow

## 2013-03-08 ENCOUNTER — Encounter: Payer: Self-pay | Admitting: Family Medicine

## 2013-03-08 ENCOUNTER — Ambulatory Visit (INDEPENDENT_AMBULATORY_CARE_PROVIDER_SITE_OTHER): Payer: BC Managed Care – PPO | Admitting: Family Medicine

## 2013-03-08 VITALS — BP 142/96 | HR 79 | Temp 97.9°F | Wt 296.2 lb

## 2013-03-08 DIAGNOSIS — R109 Unspecified abdominal pain: Secondary | ICD-10-CM

## 2013-03-08 DIAGNOSIS — R1030 Lower abdominal pain, unspecified: Secondary | ICD-10-CM

## 2013-03-08 DIAGNOSIS — N200 Calculus of kidney: Secondary | ICD-10-CM

## 2013-03-08 MED ORDER — ZOLPIDEM TARTRATE 10 MG PO TABS: 10.0000 mg | ORAL_TABLET | Freq: Every day | ORAL | Status: DC

## 2013-03-08 MED ORDER — HYDROCODONE-ACETAMINOPHEN 10-325 MG PO TABS: 1.0000 | ORAL_TABLET | Freq: Four times a day (QID) | ORAL | Status: DC | PRN

## 2013-03-08 NOTE — Patient Instructions (Addendum)
Casey Caldwell will call about your referral. Take the pain medicine sparingly as tolerated.

## 2013-03-08 NOTE — Progress Notes (Signed)
Pre-visit discussion using our clinic review tool. No additional management support is needed unless otherwise documented below in the visit note.  Pt f/u for continues renal stones and groin pain.  Prev with L orchiectomy for pain, but still with groin pain, now with some abd pain.  He continues to have frequent renal stones, he brings in a vial today of his prev passes stones, looks to have about 100 fragments in the vial.  On pain meds as before- he was trying to wean down but had more stones and his med requirement went up.  He asks about a second opinion with a different uro group.  I think this is not unreasonable.  Meds, vitals, and allergies reviewed.   ROS: See HPI.  Otherwise, noncontributory.  nad Uncomfortable from pain rrr ctab abd soft Groin exam deferred.

## 2013-03-10 NOTE — Assessment & Plan Note (Signed)
With recurrent pain and inc in renal stones.  Refer to uro at tertiary care facility.  He agrees. He'll be in contact with is primary urologist in the meantime. Continue his pain meds, vicodin changed to QID for pain flares. rx done today.  He agrees.

## 2013-03-19 ENCOUNTER — Encounter (HOSPITAL_COMMUNITY): Payer: Self-pay | Admitting: Emergency Medicine

## 2013-03-19 ENCOUNTER — Emergency Department (HOSPITAL_COMMUNITY)
Admission: EM | Admit: 2013-03-19 | Discharge: 2013-03-20 | Disposition: A | Discharge status: Home / Self Care | Payer: BC Managed Care – PPO | Attending: Emergency Medicine | Admitting: Emergency Medicine

## 2013-03-19 ENCOUNTER — Emergency Department (HOSPITAL_COMMUNITY): Payer: BC Managed Care – PPO

## 2013-03-19 DIAGNOSIS — R109 Unspecified abdominal pain: Secondary | ICD-10-CM | POA: Insufficient documentation

## 2013-03-19 DIAGNOSIS — N509 Disorder of male genital organs, unspecified: Secondary | ICD-10-CM | POA: Insufficient documentation

## 2013-03-19 DIAGNOSIS — K219 Gastro-esophageal reflux disease without esophagitis: Secondary | ICD-10-CM | POA: Insufficient documentation

## 2013-03-19 DIAGNOSIS — Z8659 Personal history of other mental and behavioral disorders: Secondary | ICD-10-CM | POA: Insufficient documentation

## 2013-03-19 DIAGNOSIS — Z9089 Acquired absence of other organs: Secondary | ICD-10-CM | POA: Insufficient documentation

## 2013-03-19 DIAGNOSIS — Z79899 Other long term (current) drug therapy: Secondary | ICD-10-CM | POA: Insufficient documentation

## 2013-03-19 DIAGNOSIS — R110 Nausea: Secondary | ICD-10-CM | POA: Insufficient documentation

## 2013-03-19 DIAGNOSIS — Z8669 Personal history of other diseases of the nervous system and sense organs: Secondary | ICD-10-CM | POA: Insufficient documentation

## 2013-03-19 DIAGNOSIS — R1030 Lower abdominal pain, unspecified: Secondary | ICD-10-CM

## 2013-03-19 DIAGNOSIS — N200 Calculus of kidney: Secondary | ICD-10-CM

## 2013-03-19 LAB — URINALYSIS, ROUTINE W REFLEX MICROSCOPIC
Bilirubin Urine: NEGATIVE
Glucose, UA: NEGATIVE mg/dL
Hgb urine dipstick: NEGATIVE
Ketones, ur: NEGATIVE mg/dL
Leukocytes, UA: NEGATIVE
Nitrite: NEGATIVE
Protein, ur: NEGATIVE mg/dL
Specific Gravity, Urine: 1.013 (ref 1.005–1.030)
Urobilinogen, UA: 0.2 mg/dL (ref 0.0–1.0)
pH: 7 (ref 5.0–8.0)

## 2013-03-19 MED ORDER — HYDROMORPHONE HCL PF 1 MG/ML IJ SOLN
1.0000 mg | Freq: Once | INTRAMUSCULAR | Status: AC
  Administered 2013-03-20: 1 mg via INTRAVENOUS
  Filled 2013-03-19: qty 1

## 2013-03-19 MED ORDER — KETOROLAC TROMETHAMINE 15 MG/ML IJ SOLN
30.0000 mg | Freq: Once | INTRAMUSCULAR | Status: AC
  Administered 2013-03-19: 30 mg via INTRAVENOUS
  Filled 2013-03-19: qty 2

## 2013-03-19 MED ORDER — ONDANSETRON HCL 4 MG/2ML IJ SOLN
4.0000 mg | Freq: Once | INTRAMUSCULAR | Status: AC
Start: 2013-03-19 — End: 2013-03-19
  Administered 2013-03-19: 4 mg via INTRAVENOUS
  Filled 2013-03-19: qty 2

## 2013-03-19 MED ORDER — SODIUM CHLORIDE 0.9 % IV SOLN
Freq: Once | INTRAVENOUS | Status: AC
  Administered 2013-03-20: via INTRAVENOUS

## 2013-03-19 NOTE — ED Provider Notes (Signed)
CSN: [id number]     Arrival date & time 03/19/13  2041 History   First MD Initiated Contact with Patient 03/19/13 2241     Chief Complaint  Patient presents with  . Flank Pain     (Consider location/radiation/quality/duration/timing/severity/associated sxs/prior Treatment) The history is provided by the patient. No language interpreter was used.  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 Patient reported that the pain is a fire, burning sensation to bilateral flanks with most discomfort noted to the left side. Patient reported that the pain is radiating down to the right groin region/testicle - described the pain as "lightning." Reported that over the past couple of weeks that right testicle has become sensitive to the touch. Patient reported that he has been feeling nauseous with negative episodes of emesis. Stated that he has been taking Vicodin with minimal relief. Reported that he has an appointment with Urology next week. Patient reported that he sees Dr. Damita Dunnings who prescribes his pain medications - reported that he does not get discharged with pain medications. Reported that he's been straining his urine with each urination. Denied fever, chills, chest pain, shortness of breath, difficulty breathing, vomiting, changes to bowel movements, melena, hematochezia. PCP Dr. Damita Dunnings  Past Medical History  Diagnosis Date  . GERD (gastroesophageal reflux disease)   . History of kidney stones   . Seizures Childhood  . Depression     and panic/anxiety  . Insomnia   . Kidney stones    Past Surgical History  Procedure Laterality Date  . Lithotripsy      four times over the years; most recent 10/08/12  . Appendectomy  2011  . Laparoscopic cholecystectomy  1996  .  Tonsillectomy  1992  . Wrist ganglion excision  2002    Right  . Hydrocele excision Left 06/2012  . Orchiectomy Left 11/2012   Family History  Problem Relation Age of Onset  . Stroke Mother   . Liver disease Mother   . Irritable bowel syndrome Mother   . Kidney disease Mother   . Heart disease Father   . Colon polyps Father   . Colon cancer Neg Hx    History  Substance Use Topics  . Smoking status: Never Smoker   . Smokeless tobacco: Never Used  . Alcohol Use: Yes     Comment: rarely    Review of Systems  Constitutional: Positive for chills. Negative for fever.  Respiratory: Negative for chest tightness and shortness of breath.   Cardiovascular: Negative for chest pain.  Gastrointestinal: Positive for nausea. Negative for vomiting and abdominal pain.  Genitourinary: Positive for flank pain and testicular pain (right). Negative for dysuria, urgency, hematuria, decreased urine volume, discharge, penile swelling, scrotal swelling and penile pain.  Musculoskeletal: Negative for back pain.  All other systems reviewed and are negative.      Allergies  Oxycodone  Home Medications   Current Outpatient Rx  Name  Route  Sig  Dispense  Refill  . docusate sodium (COLACE) 100 MG capsule   Oral   Take 100 mg by mouth 2 (two) times daily.         Marland Kitchen HYDROcodone-acetaminophen (NORCO) 10-325 MG per tablet   Oral   Take 1-2 tablets by mouth every 6 (six) hours as needed for  severe pain (pain).         Marland Kitchen ibuprofen (ADVIL,MOTRIN) 400 MG tablet   Oral   Take 800 mg by mouth every 6 (six) hours as needed (pain).         . ondansetron (ZOFRAN ODT) 4 MG disintegrating tablet   Oral   Take 1 tablet (4 mg total) by mouth every 8 (eight) hours as needed for nausea or vomiting.   20 tablet   2   . pantoprazole (PROTONIX) 40 MG tablet   Oral   Take 40 mg by mouth 2 (two) times daily.         . tamsulosin (FLOMAX) 0.4 MG CAPS capsule   Oral   Take 0.4 mg by mouth.           . zolpidem (AMBIEN) 10 MG tablet   Oral   Take 1 tablet (10 mg total) by mouth at bedtime. Fill on/after 01/08/13   30 tablet   1   . ondansetron (ZOFRAN) 4 MG tablet   Oral   Take 1 tablet (4 mg total) by mouth every 6 (six) hours.   12 tablet   0   . tamsulosin (FLOMAX) 0.4 MG CAPS capsule   Oral   Take 1 capsule (0.4 mg total) by mouth daily.   15 capsule   0    BP 145/98  Pulse 87  Temp(Src) 98.6 F (37 C) (Oral)  Resp 24  Ht 6' (1.829 m)  Wt 290 lb (131.543 kg)  BMI 39.32 kg/m2  SpO2 97% Physical Exam  Nursing note and vitals reviewed. Constitutional: He is oriented to person, place, and time. He appears well-developed and well-nourished. No distress.  HENT:  Head: Normocephalic and atraumatic.  Mouth/Throat: Oropharynx is clear and moist. No oropharyngeal exudate.  Eyes: Conjunctivae and EOM are normal. Pupils are equal, round, and reactive to light. Right eye exhibits no discharge. Left eye exhibits no discharge.  Neck: Normal range of motion. Neck supple. No tracheal deviation present.  Negative neck stiffness Negative nuchal rigidity Negative cervical lymphadenopathy   Cardiovascular: Normal rate, regular rhythm and normal heart sounds.  Exam reveals no friction rub.   No murmur heard. Pulses:      Radial pulses are 2+ on the right side, and 2+ on the left side.       Dorsalis pedis pulses are 2+ on the right side, and 2+ on the left side.  Cap refill < 3 seconds  Pulmonary/Chest: Effort normal and breath sounds normal. No respiratory distress. He has no wheezes. He has no rales.  Abdominal: Soft. Bowel sounds are normal. There is tenderness. There is no guarding.  Obese Discomfort upon palpation to the left lower quadrant  Negative McBurney's point Negative Murphy's sign  Positive CVA tenderness bilaterally with discomfort upon the left side   Genitourinary: Penis normal.    Right testis shows tenderness. Right testis shows no mass and no  swelling. Right testis is descended. Cremasteric reflex is not absent on the right side.  Testicular exam: Negative swelling, erythema, inflammation, lesions, sores, noted to the scrotum. Orchiectomy noted to the left side. Discomfort upon palpation to the right testicle - generalized - negative deformities noted to the abdomen. Negative inguinal lymphadenopathy bilaterally. Superficial horizontal linear abrasion to the suprapubic region noted, site of incision - negative cellulitic findings noted. Exam chaperoned with tech.  Musculoskeletal: Normal range of motion.  Full ROM to upper and lower extremities without difficulty noted, negative ataxia noted.  Lymphadenopathy:  He has no cervical adenopathy.  Neurological: He is alert and oriented to person, place, and time. No cranial nerve deficit. He exhibits normal muscle tone. Coordination normal.  Skin: Skin is warm and dry. No rash noted. He is not diaphoretic. No erythema.  Psychiatric: He has a normal mood and affect. His behavior is normal. Thought content normal.    ED Course  Procedures (including critical care time)  2:17 AM This provider re-assessed the patient. Patient reported that he is feeling better. Patient sitting upright and interactive - pleasant. Reported pain has improved tremendously.  Results for orders placed during the hospital encounter of 03/19/13  URINALYSIS, ROUTINE W REFLEX MICROSCOPIC      Result Value Ref Range   Color, Urine YELLOW  YELLOW   APPearance CLEAR  CLEAR   Specific Gravity, Urine 1.013  1.005 - 1.030   pH 7.0  5.0 - 8.0   Glucose, UA NEGATIVE  NEGATIVE mg/dL   Hgb urine dipstick NEGATIVE  NEGATIVE   Bilirubin Urine NEGATIVE  NEGATIVE   Ketones, ur NEGATIVE  NEGATIVE mg/dL   Protein, ur NEGATIVE  NEGATIVE mg/dL   Urobilinogen, UA 0.2  0.0 - 1.0 mg/dL   Nitrite NEGATIVE  NEGATIVE   Leukocytes, UA NEGATIVE  NEGATIVE  CBC WITH DIFFERENTIAL      Result Value Ref Range   WBC 11.5 (*) 4.0 - 10.5  K/uL   RBC 5.61  4.22 - 5.81 MIL/uL   Hemoglobin 17.5 (*) 13.0 - 17.0 g/dL   HCT 49.0  39.0 - 52.0 %   MCV 87.3  78.0 - 100.0 fL   MCH 31.2  26.0 - 34.0 pg   MCHC 35.7  30.0 - 36.0 g/dL   RDW 13.2  11.5 - 15.5 %   Platelets 333  150 - 400 K/uL   Neutrophils Relative % 84 (*) 43 - 77 %   Neutro Abs 9.7 (*) 1.7 - 7.7 K/uL   Lymphocytes Relative 12  12 - 46 %   Lymphs Abs 1.4  0.7 - 4.0 K/uL   Monocytes Relative 4  3 - 12 %   Monocytes Absolute 0.4  0.1 - 1.0 K/uL   Eosinophils Relative 0  0 - 5 %   Eosinophils Absolute 0.0  0.0 - 0.7 K/uL   Basophils Relative 0  0 - 1 %   Basophils Absolute 0.0  0.0 - 0.1 K/uL  COMPREHENSIVE METABOLIC PANEL      Result Value Ref Range   Sodium 138  137 - 147 mEq/L   Potassium 4.5  3.7 - 5.3 mEq/L   Chloride 98  96 - 112 mEq/L   CO2 24  19 - 32 mEq/L   Glucose, Bld 113 (*) 70 - 99 mg/dL   BUN 9  6 - 23 mg/dL   Creatinine, Ser 0.84  0.50 - 1.35 mg/dL   Calcium 10.2  8.4 - 10.5 mg/dL   Total Protein 8.2  6.0 - 8.3 g/dL   Albumin 4.7  3.5 - 5.2 g/dL   AST 30  0 - 37 U/L   ALT 45  0 - 53 U/L   Alkaline Phosphatase 77  39 - 117 U/L   Total Bilirubin 0.4  0.3 - 1.2 mg/dL   GFR calc non Af Amer >90  >90 mL/min   GFR calc Af Amer >90  >90 mL/min  LIPASE, BLOOD      Result Value Ref Range   Lipase 40  11 - 59 U/L  Ct Abdomen Pelvis Wo Contrast  03/19/2013   CLINICAL DATA:  33 year old male with left flank and abdominal pain. History of urinary calculi.  EXAM: CT ABDOMEN AND PELVIS WITHOUT CONTRAST  TECHNIQUE: Multidetector CT imaging of the abdomen and pelvis was performed following the standard protocol without intravenous contrast.  COMPARISON:  02/27/2013 and prior CTs  FINDINGS: A 2.5 mm proximal left renal calculus is unchanged from 02/27/2013. There is no evidence of hydronephrosis.  A 4 mm nonobstructing mid left renal calculus is noted as well as several other punctate left renal calculi.  Diffuse fatty infiltration of the liver is identified.   The spleen, pancreas, and adrenal glands are unremarkable.  Please note that parenchymal abnormalities may be missed without intravenous contrast.  There is no evidence of free fluid, enlarged lymph nodes, biliary dilation or abdominal aortic aneurysm.  The bowel and bladder are unremarkable. There is no evidence of bowel obstruction, pneumoperitoneum or abscess.  No acute or suspicious bony abnormalities are identified.  IMPRESSION: Unchanged 2.5 mm proximal left ureteral calculus without hydronephrosis.  Nonobstructing left renal calculi.  Diffuse fatty infiltration of the liver.   Electronically Signed   By: Hassan Rowan M.D.   On: 03/19/2013 23:19   Ct Abdomen Pelvis Wo Contrast  02/27/2013   CLINICAL DATA:  Bilateral flank pain  EXAM: CT ABDOMEN AND PELVIS WITHOUT CONTRAST  TECHNIQUE: Multidetector CT imaging of the abdomen and pelvis was performed following the standard protocol without intravenous contrast.  COMPARISON:  CT scan the abdomen pelvis of 19 July 2012.  FINDINGS: The kidneys are normal in contour and exhibit no hydronephrosis. No calcified stones are demonstrated on the right. On the left in the upper pole there is a 2 mm diameter nonobstructing stone. In the mid to lower pole there is a 3 mm diameter nonobstructing stone. There is a 2 mm diameter nonobstructive stone at the left UPJ on image 57 of series 2. Along the course of the ureters no stones are demonstrated. No UVJ stones are demonstrated. The bladder is nondistended. The prostate gland and seminal vesicles appear normal. The psoas musculature is normal in appearance.  The gallbladder is surgically absent. The liver, spleen, pancreas, adrenal glands, and partially distended stomach appear normal for the noncontrast exam. The caliber of the abdominal aorta is normal. The unopacified loops of small and large bowel are normal in appearance. The appendix is surgically absent. There is no intra-abdominal or pelvic lymphadenopathy. There is no  evidence of ascites. There is no inguinal or umbilical hernia.  The lumbar vertebral bodies are preserved in height. The bony pelvis is normal in appearance. The lung bases are clear.  IMPRESSION: 1. There are kidney stones on the left. There is a nonobstructed left UPJ stone measuring 2 mm in diameter. The urinary bladder is normal in appearance. No stones are demonstrated on the right. 2. There is no acute hepatobiliary abnormality nor acute bowel abnormality. 3. There is no intra-abdominal or pelvic lymphadenopathy.   Electronically Signed   By: David  Martinique   On: 02/27/2013 16:03   US Scrotum  03/20/2013   CLINICAL DATA:  33 year old male with scrotal pain and swelling. History of left orchiectomy in 12/2012.  EXAM: SCROTAL ULTRASOUND  DOPPLER ULTRASOUND OF THE TESTICLES  TECHNIQUE: Complete ultrasound examination of the testicles, epididymis, and other scrotal structures was performed. Color and spectral Doppler ultrasound were also utilized to evaluate blood flow to the testicles.  COMPARISON:  Multiple priors  FINDINGS: Right testicle  Measurements: 4.7 x 2.1 x 3.6 cm. No mass or microlithiasis visualized. Normal color, arterial and venous flow noted.  Left testicle  Not visualized compatible with orchidectomy.  Right epididymis:  Normal in size and appearance.  Left epididymis:  Not visualized  Hydrocele:  None visualized.  Varicocele:  None visualized.  Pulsed Doppler interrogation of the right testicle demonstrates low resistance arterial and venous waveforms on the right.  IMPRESSION: Normal right testicle.  No evidence of testicular torsion.  Status post left orchectomy.   Electronically Signed   By: Hassan Rowan M.D.   On: 03/20/2013 00:44   [place] Art/ven Flow Abd Pelv Doppler  03/20/2013   CLINICAL DATA:  33 year old male with scrotal pain and swelling. History of left orchiectomy in 12/2012.  EXAM: SCROTAL ULTRASOUND  DOPPLER ULTRASOUND OF THE TESTICLES  TECHNIQUE: Complete ultrasound examination of  the testicles, epididymis, and other scrotal structures was performed. Color and spectral Doppler ultrasound were also utilized to evaluate blood flow to the testicles.  COMPARISON:  Multiple priors  FINDINGS: Right testicle  Measurements: 4.7 x 2.1 x 3.6 cm. No mass or microlithiasis visualized. Normal color, arterial and venous flow noted.  Left testicle  Not visualized compatible with orchidectomy.  Right epididymis:  Normal in size and appearance.  Left epididymis:  Not visualized  Hydrocele:  None visualized.  Varicocele:  None visualized.  Pulsed Doppler interrogation of the right testicle demonstrates low resistance arterial and venous waveforms on the right.  IMPRESSION: Normal right testicle.  No evidence of testicular torsion.  Status post left orchectomy.   Electronically Signed   By: Hassan Rowan M.D.   On: 03/20/2013 00:44   Labs Review Labs Reviewed  CBC WITH DIFFERENTIAL - Abnormal; Notable for the following:    WBC 11.5 (*)    Hemoglobin 17.5 (*)    Neutrophils Relative % 84 (*)    Neutro Abs 9.7 (*)    All other components within normal limits  COMPREHENSIVE METABOLIC PANEL - Abnormal; Notable for the following:    Glucose, Bld 113 (*)    All other components within normal limits  URINALYSIS, ROUTINE W REFLEX MICROSCOPIC  LIPASE, BLOOD   Imaging Review Ct Abdomen Pelvis Wo Contrast  03/19/2013   CLINICAL DATA:  33 year old male with left flank and abdominal pain. History of urinary calculi.  EXAM: CT ABDOMEN AND PELVIS WITHOUT CONTRAST  TECHNIQUE: Multidetector CT imaging of the abdomen and pelvis was performed following the standard protocol without intravenous contrast.  COMPARISON:  02/27/2013 and prior CTs  FINDINGS: A 2.5 mm proximal left renal calculus is unchanged from 02/27/2013. There is no evidence of hydronephrosis.  A 4 mm nonobstructing mid left renal calculus is noted as well as several other punctate left renal calculi.  Diffuse fatty infiltration of the liver is  identified.  The spleen, pancreas, and adrenal glands are unremarkable.  Please note that parenchymal abnormalities may be missed without intravenous contrast.  There is no evidence of free fluid, enlarged lymph nodes, biliary dilation or abdominal aortic aneurysm.  The bowel and bladder are unremarkable. There is no evidence of bowel obstruction, pneumoperitoneum or abscess.  No acute or suspicious bony abnormalities are identified.  IMPRESSION: Unchanged 2.5 mm proximal left ureteral calculus without hydronephrosis.  Nonobstructing left renal calculi.  Diffuse fatty infiltration of the liver.   Electronically Signed   By: Hassan Rowan M.D.   On: 03/19/2013 23:19   US Scrotum  03/20/2013   CLINICAL DATA:  33 year old male with  scrotal pain and swelling. History of left orchiectomy in 12/2012.  EXAM: SCROTAL ULTRASOUND  DOPPLER ULTRASOUND OF THE TESTICLES  TECHNIQUE: Complete ultrasound examination of the testicles, epididymis, and other scrotal structures was performed. Color and spectral Doppler ultrasound were also utilized to evaluate blood flow to the testicles.  COMPARISON:  Multiple priors  FINDINGS: Right testicle  Measurements: 4.7 x 2.1 x 3.6 cm. No mass or microlithiasis visualized. Normal color, arterial and venous flow noted.  Left testicle  Not visualized compatible with orchidectomy.  Right epididymis:  Normal in size and appearance.  Left epididymis:  Not visualized  Hydrocele:  None visualized.  Varicocele:  None visualized.  Pulsed Doppler interrogation of the right testicle demonstrates low resistance arterial and venous waveforms on the right.  IMPRESSION: Normal right testicle.  No evidence of testicular torsion.  Status post left orchectomy.   Electronically Signed   By: Hassan Rowan M.D.   On: 03/20/2013 00:44   [place] Art/ven Flow Abd Pelv Doppler  03/20/2013   CLINICAL DATA:  33 year old male with scrotal pain and swelling. History of left orchiectomy in 12/2012.  EXAM: SCROTAL ULTRASOUND  DOPPLER  ULTRASOUND OF THE TESTICLES  TECHNIQUE: Complete ultrasound examination of the testicles, epididymis, and other scrotal structures was performed. Color and spectral Doppler ultrasound were also utilized to evaluate blood flow to the testicles.  COMPARISON:  Multiple priors  FINDINGS: Right testicle  Measurements: 4.7 x 2.1 x 3.6 cm. No mass or microlithiasis visualized. Normal color, arterial and venous flow noted.  Left testicle  Not visualized compatible with orchidectomy.  Right epididymis:  Normal in size and appearance.  Left epididymis:  Not visualized  Hydrocele:  None visualized.  Varicocele:  None visualized.  Pulsed Doppler interrogation of the right testicle demonstrates low resistance arterial and venous waveforms on the right.  IMPRESSION: Normal right testicle.  No evidence of testicular torsion.  Status post left orchectomy.   Electronically Signed   By: Hassan Rowan M.D.   On: 03/20/2013 00:44    EKG Interpretation   None       MDM   Final diagnoses:  Kidney stone  Groin pain   Medications  ketorolac (TORADOL) 15 MG/ML injection 30 mg (30 mg Intravenous Given 03/19/13 2328)  ondansetron (ZOFRAN) injection 4 mg (4 mg Intravenous Given 03/19/13 2326)  HYDROmorphone (DILAUDID) injection 1 mg (1 mg Intravenous Given 03/20/13 0004)  0.9 %  sodium chloride infusion ( Intravenous Stopped 03/20/13 0223)  HYDROmorphone (DILAUDID) injection 1 mg (1 mg Intravenous Given 03/20/13 0108)  ondansetron (ZOFRAN) injection 4 mg (4 mg Intravenous Given 03/20/13 0107)   Filed Vitals:   03/19/13 2103  BP: 145/98  Pulse: 87  Temp: 98.6 F (37 C)  TempSrc: Oral  Resp: 24  Height: 6' (1.829 m)  Weight: 290 lb (131.543 kg)  SpO2: 97%    Patient presenting to the ED with left sided flank pain that started on Saturday described as a sharp pain that radiates to the right groin region - patient reported that the groin discomfort feels like "lightning." Patient reported that he has been feeling nauseous  without episodes of emesis. Stated that he has a long history of kidney stones - stated that he has had 9 lithotripsies. Stated that he recently had left orchidectomy secondary to chronic epididymitis. Stated that he is currently on Vicodins with minimal relief.  Alert and oriented. GCS 15. Heart rate and rhythm normal. Lungs clear to auscultation to upper lower lobes bilaterally. Cap refill  less than 3 seconds. Radial and DP pulses 2+ bilaterally. Negative for Ms. noted to the back. Discomfort upon palpation to CVA tenderness bilaterally-most discomfort noted to the left side. Bowel sounds normal active in all 4 quadrants, soft upon palpation with discomfort localized to the left and right groin region. Negative inguinal lymphadenopathy. Left orchidectomy identified with negative swelling, erythema, inflammation, lesions, sores, abnormalities noted to the scrotum. Discomfort upon palpation to the right testicle with negative findings of inflammation or acute infection-exam chaperoned. Full range of motion to upper and lower extremities bilaterally without difficulty noted. CBC noted mild elevated white blood cell count of 11.5-mildly elevated neutrophils at 9.7-negative left shift, negative leukocytosis. CMP negative findings. BUN/creatinine proper within normal limits. Lipase negative elevation. Urinalysis negative for hemoglobin-negative nitrites or leukocytes identified-negative pyuria. CT abdomen and pelvis without contrast noted a 4 mm nonobstructing mid left renal calculus and a 2.5 mm proximal left renal calculus that is unchanged from 02/27/2013-no evidence of hydronephrosis. Ultrasound of vein/artery of the pelvis and scrotum negative for testicular torsion, negative abnormalities or epididymitis noted to the right testicle.  When compared to CT abdomen and pelvis without contrast performed on 02/27/2013 - 3 nonobstructing stones noted - 2 mm mid left pole of kidney, 3 mm in lower left pole of kidney, and  2 mm of UPJ of left kidney. One stone has passed.  Doubt pyelonephritis. Doubt pancreatitis. Doubt epididymitis of right testicle. Negative findings for testicular torsion. Negative hydrocele or varicocele noted. Suspicion to be nephrolithiasis and referred pain to the right groin. A 2.5 mm and a 4 mm stone identified to left kidney, nonobstructing-has been present since early February - patient has history of chronic kidney stones. Patient has appointment with urology next week. Patient cannot be discharged with pain medication secondary to a verbal agreement with him in his primary care provider. Patient stable, afebrile. Patient does not appear septic. Pain controlled in ED setting. Nausea controlled in ED setting. Patient tolerated fluids PO without difficulty - negative episodes of emesis while in ED setting. Discharged patient. Discharged patient with flomax and antiemetics. Referred to PCP. Discussed to keep appointment with Urology next week. Discussed with patient to drink plenty of fluids and to continue to strain urine. Discussed with patient to closely monitor symptoms and if symptoms are to worsen or change to report back to the ED - strict return instructions given.  Patient agreed to plan of care, understood, all questions answered.   Jamse Mead, PA-C 03/20/13 1551

## 2013-03-19 NOTE — ED Notes (Signed)
Pt reports that he has been having L sided flank pain worse than the R, states he has been diagnosed with kidney stones and states that he has been having to increase his pain medications at home. Pt reports nausea, denies vomiting. Pt a&o x4, ambulatory to triage.

## 2013-03-20 ENCOUNTER — Emergency Department (HOSPITAL_COMMUNITY): Payer: BC Managed Care – PPO

## 2013-03-20 LAB — COMPREHENSIVE METABOLIC PANEL
ALT: 45 U/L (ref 0–53)
AST: 30 U/L (ref 0–37)
Albumin: 4.7 g/dL (ref 3.5–5.2)
Alkaline Phosphatase: 77 U/L (ref 39–117)
BUN: 9 mg/dL (ref 6–23)
CO2: 24 mEq/L (ref 19–32)
Calcium: 10.2 mg/dL (ref 8.4–10.5)
Chloride: 98 mEq/L (ref 96–112)
Creatinine, Ser: 0.84 mg/dL (ref 0.50–1.35)
GFR calc Af Amer: >90 mL/min (ref >90)
GFR calc non Af Amer: >90 mL/min (ref >90)
Glucose, Bld: 113 mg/dL — ABNORMAL HIGH (ref 70–99)
Potassium: 4.5 mEq/L (ref 3.7–5.3)
Sodium: 138 mEq/L (ref 137–147)
Total Bilirubin: 0.4 mg/dL (ref 0.3–1.2)
Total Protein: 8.2 g/dL (ref 6.0–8.3)

## 2013-03-20 LAB — CBC WITH DIFFERENTIAL/PLATELET
Basophils Absolute: 0 10*3/uL (ref 0.0–0.1)
Basophils Relative: 0 % (ref 0–1)
Eosinophils Absolute: 0 10*3/uL (ref 0.0–0.7)
Eosinophils Relative: 0 % (ref 0–5)
HCT: 49 % (ref 39.0–52.0)
Hemoglobin: 17.5 g/dL — ABNORMAL HIGH (ref 13.0–17.0)
Lymphocytes Relative: 12 % (ref 12–46)
Lymphs Abs: 1.4 10*3/uL (ref 0.7–4.0)
MCH: 31.2 pg (ref 26.0–34.0)
MCHC: 35.7 g/dL (ref 30.0–36.0)
MCV: 87.3 fL (ref 78.0–100.0)
Monocytes Absolute: 0.4 10*3/uL (ref 0.1–1.0)
Monocytes Relative: 4 % (ref 3–12)
Neutro Abs: 9.7 10*3/uL — ABNORMAL HIGH (ref 1.7–7.7)
Neutrophils Relative %: 84 % — ABNORMAL HIGH (ref 43–77)
Platelets: 333 10*3/uL (ref 150–400)
RBC: 5.61 MIL/uL (ref 4.22–5.81)
RDW: 13.2 % (ref 11.5–15.5)
WBC: 11.5 10*3/uL — ABNORMAL HIGH (ref 4.0–10.5)

## 2013-03-20 LAB — LIPASE, BLOOD: Lipase: 40 U/L (ref 11–59)

## 2013-03-20 MED ORDER — TAMSULOSIN HCL 0.4 MG PO CAPS: 0.4000 mg | ORAL_CAPSULE | Freq: Every day | ORAL | Status: DC

## 2013-03-20 MED ORDER — ONDANSETRON HCL 4 MG PO TABS: 4.0000 mg | ORAL_TABLET | Freq: Four times a day (QID) | ORAL | Status: DC

## 2013-03-20 MED ORDER — ONDANSETRON HCL 4 MG/2ML IJ SOLN
4.0000 mg | Freq: Once | INTRAMUSCULAR | Status: AC
  Administered 2013-03-20: 4 mg via INTRAVENOUS
  Filled 2013-03-20: qty 2

## 2013-03-20 MED ORDER — HYDROMORPHONE HCL PF 1 MG/ML IJ SOLN
1.0000 mg | Freq: Once | INTRAMUSCULAR | Status: AC
  Administered 2013-03-20: 1 mg via INTRAVENOUS
  Filled 2013-03-20: qty 1

## 2013-03-20 NOTE — ED Notes (Signed)
Pt was offered and given sprite.

## 2013-03-20 NOTE — ED Notes (Signed)
Pt able to drink a can of sprite--- denies nausea at this time.

## 2013-03-20 NOTE — Discharge Instructions (Signed)
Please call your doctor for a followup appointment within 24-48 hours. When you talk to your doctor please let them know that you were seen in the emergency department and have them acquire all of your records so that they can discuss the findings with you and formulate a treatment plan to fully care for your new and ongoing problems. Please keep appointment with urology next week Please rest and please drink plenty of fluids Please continue to strain urine Please continue to take at home medication as prescribed. While on pain medications there is to be no drinking alcohol, driving, operating any heavy machinery if there is extra please disposer proper manner. While on pain medications please do not take any extra Tylenol for this can lead to Tylenol overdose and liver issues. Please take Flomax and Zofran as prescribed Please continue to monitor symptoms closely if symptoms are to worsen or change (fever greater than 101, chills, chest pain, shortness of breath, difficulty breathing, blood in the urine, pus in the urine, numbness, tingling, swelling to the testicle, changes to testicular pain, inflammation, decreased urination, nausea, vomiting, worsening or changes to flank or abdominal pain) please report back to emergency department immediately   Purine Restricted Diet A low-purine diet consists of foods that reduce uric acid made in your body. INDICATIONS FOR USE  Your caregiver may ask you to follow a low-purine diet to reduce gout flairs.  GUIDELINES  Avoid high-purine foods, including all alcohol, yeast extracts taken as supplements, and sauces made from meats (like gravy). Do not eat high-purine meats, including anchovies, sardines, herring, mussels, tuna, codfish, scallops, trout, haddock, bacon, organ meats, tripe, goose, wild game, and sweetbreads.  Grains  Allowed/Recommended: All, except those listed to consume in moderation.  Consume in Moderation: Oatmeal ( cup uncooked daily),  wheat bran or germ ( cup daily), and whole grains. Vegetables  Allowed/Recommended: All, except those listed to consume in moderation.  Consume in Moderation: Asparagus, cauliflower, spinach, mushrooms, and green peas ( cup daily). Fruit  Allowed/Recommended: All.  Consume in Moderation: None. Meat and Meat Substitutes  Allowed/Recommended: Eggs, nuts, and peanut butter.  Consume in Moderation: Limit to 4 to 6 oz daily. Avoid high-purine meats. Lentils, peas, and dried beans (1 cup daily). Milk  Allowed/Recommended: All. Choose low-fat or skim when possible.  Consume in Moderation: None. Fats and Oils  Allowed/Recommended: All.  Consume in Moderation: None. Beverages  Allowed/Recommended: All, except those listed to avoid.  Avoid: All alcohol. Condiments/Miscellaneous  Allowed/Recommended: All, except those listed to consume in moderation.  Consume in Moderation: Bouillon and meat-based broths and soups. Document Released: 05/07/2010 Document Revised: 04/04/2011 Document Reviewed: 05/07/2010 [facility] Patient Information 2014 [place].  Diet for Kidney Stones Kidney stones are small, hard masses that form inside your kidneys. They are made up of salts and minerals and often form when high levels build up in the urine. The minerals can then start to build up, crystalize, and stick together to form stones. There are several different types of kidney stones. The following types of stones may be influenced by dietary factors:   Calcium Oxalate Stones. An oxalate is a salt found in certain foods. Within the body, calcium can combine with oxalates to form calcium oxalate stones, which can be excreted in the urine in high amounts. This is the most common type of kidney stone.  Calcium Phosphate Stones. These stones may occur when the pH of the urine becomes too high, or less acidic, from too much calcium being  excreted in the urine. The pH is a measure of how acidic or  basic a substance is.  Uric Acid Stones. This type of stone occurs when the pH of the urine becomes too low, or very acidic, because substances called purines build up in the urine. Purines are found in animal proteins. When the urine is highly concentrated with acid, uric acid kidney stones can form.  Other risk factors for kidney stones include genetics, environment, and being overweight. Your caregiver may ask you to follow specific diet guidelines based on the type of stone you have to lessen the chances of your body making more kidney stones.  GENERAL GUIDELINES FOR ALL TYPES OF STONES  Drink plenty of fluid. Drink 12 16 cups of fluid a day, drinking mainly water.This is the most important thing you can do to prevent the formation of future kidney stones.  Maintain a healthy weight. Your caregiver or dietitian can help you determine what a healthy weight is for you. If you are overweight, weight loss may help prevent the formation of future kidney stones.  Eat a diet adequate in animal protein. Too much animal protein can contribute to the formation of stones. Your dietitian can help you determine how much protein you should be eating. Avoid low carbohydrate, high protein diets.  Follow a balanced eating approach. The DASH diet, which stands for "Dietary Approaches to Stop Hypertension," is an effective meal plan for reducing stone formation. This diet is high in fruits, vegetables, dairy, and whole grains and low in animal protein. Ask your caregiver or dietitian for information about the DASH diet. ADDITIONAL DIET GUIDELINES FOR CALCIUM STONES Avoid foods high in salt. This includes table salt, salt seasonings, MSG, soy sauce, cured and processed meats, salted crackers and snack foods, fast food, and canned soups and foods. Ask your caregiver or dietitian for information about reducing sodium in your diet or following the low sodium diet.  Ensure adequate calcium intake. Use the following  table for calcium guidelines:  Men 65 years old and younger  1000 mg/day.  Men 16 years old and older  1500 mg/day.  Women 21 35 years old  1000 mg/day.  Women 50 years and older  1500 mg/day. Your dietitian can help you determine if you are getting enough calcium in your diet. Foods that are high in calcium include dairy products, broccoli, cheese, yogurt, and pudding. If you need to take a calcium supplement, take it only in the form of calcium citrate.  Avoid foods high in oxalate. Be sure that any supplements you take do not contain more than 500 mg of vitamin C. Vitamin C is converted into oxalate in the body. You do not need to avoid fruits and vegetables high in vitamin C.   Grains: High-fiber or bran cereal, whole-wheat bread, grits, barley, buckwheat, amaranth, pretzels, and fruitcake.  Vegetables: Dried beans, wax beans, dark leafy greens, eggplant, leeks, okra, parsley, rutabaga, tomato paste, watercress, zucchini, and escarole.  Fruit: Dried apricots, red currants, figs, kiwi, and rhubarb.  Meat and Meat Substitutes: Soybeans and foods made from soy (soyburger, miso), dried beans, peanut butter.  Milk: Chocolate milk mixes and soymilk.  Fats and Oils: Nuts (peanuts, almonds, pecans, cashews, hazelnuts) and nut butters, sesame seeds, and tDahini paste.  Condiments/Miscellaneous: Chocolate, carob, marmalade, poppy seeds, instant iced tea, and juice from high-oxalate fruits.  Document Released: 05/07/2010 Document Revised: 07/12/2011 Document Reviewed: 06/27/2011 [facility] Patient Information 2014 [place].  Kidney Stones Kidney stones (urolithiasis) are solid  masses that form inside your kidneys. The intense pain is caused by the stone moving through the kidney, ureter, bladder, and urethra (urinary tract). When the stone moves, the ureter starts to spasm around the stone. The stone is usually passed in your pee (urine).  HOME CARE  Drink enough fluids to keep your  pee clear or pale yellow. This helps to get the stone out.  Strain all pee through the provided strainer. Do not pee without peeing through the strainer, not even once. If you pee the stone out, catch it in the strainer. The stone may be as small as a grain of salt. Take this to your doctor. This will help your doctor figure out what you can do to try to prevent more kidney stones.  Only take medicine as told by your doctor.  Follow up with your doctor as told.  Get follow-up X-rays as told by your doctor. GET HELP IF: You have pain that gets worse even if you have been taking pain medicine. GET HELP RIGHT AWAY IF:   Your pain does not get better with medicine.  You have a fever or shaking chills.  Your pain increases and gets worse over 18 hours.  You have new belly (abdominal) pain.  You feel faint or pass out.  You are unable to pee. MAKE SURE YOU:   Understand these instructions.  Will watch your condition.  Will get help right away if you are not doing well or get worse. Document Released: 06/29/2007 Document Revised: 09/12/2012 Document Reviewed: 06/13/2012 [facility] Patient Information 2014 [place], [place].    Emergency Department Resource Guide 1) Find a Doctor and Pay Out of Pocket Although you won't have to find out who is covered by your insurance plan, it is a good idea to ask around and get recommendations. You will then need to call the office and see if the doctor you have chosen will accept you as a new patient and what types of options they offer for patients who are self-pay. Some doctors offer discounts or will set up payment plans for their patients who do not have insurance, but you will need to ask so you aren't surprised when you get to your appointment.  2) Contact Your Local Health Department Not all health departments have doctors that can see patients for sick visits, but many do, so it is worth a call to see if yours does. If you don't know where  your local health department is, you can check in your phone book. The CDC also has a tool to help you locate your state's health department, and many state websites also have listings of all of their local health departments.  3) Find a [facility] If your illness is not likely to be very severe or complicated, you may want to try a walk in clinic. These are popping up all over the country in pharmacies, drugstores, and shopping centers. They're usually staffed by nurse practitioners or physician assistants that have been trained to treat common illnesses and complaints. They're usually fairly quick and inexpensive. However, if you have serious medical issues or chronic medical problems, these are probably not your best option.  No Primary Care Doctor: - Call Health Connect at  [phone] - they can help you locate a primary care doctor that  accepts your insurance, provides certain services, etc. - Physician Referral Service- [phone]  Chronic Pain Problems: Organization         Address  Phone   Notes  [facility]  [phone] Patients need to be referred by their primary care doctor.   Medication Assistance: Organization         Address  Phone   Notes  [facility] Medication [facility] [address], [place] [address] [phone] --Must be a resident of [facility] -- Must have NO insurance coverage whatsoever (no Medicaid/ Medicare, etc.) -- The pt. MUST have a primary care doctor that directs their care regularly and follows them in the community   [facility]ist  [phone]   [employer]  [phone]    Agencies that provide inexpensive medical care: Organization         Address  Phone   Notes  [place]  [phone]   [facility]    [phone]   [facility] [place], [place] [address] [phone]   [place] [address], [place] [phone]   Planned Parenthood    [phone]   [facility]    [phone]   [place] [address], [place] Phone:  [phone], Fax:  [phone] Hours of Operation:  9 am - 6 pm, M-F.  Also accepts Medicaid/Medicare and self-pay.  [facility] for [place] [address], [address], [place] Phone: [phone], Fax: [phone]. Hours of Operation:  8:30 am - 5:30 pm, M-F.  Also accepts Medicaid and self-pay.  [facility] [address] [place] Phone: [phone]   [place], [place], [place] [phone], Ext. [phone] Mondays & Thursdays: 7-9 AM.  First 15 patients are seen on a first come, first serve basis.    [place] Providers:  Organization         Address  Phone   Notes  [facility] [address], [place] [phone] Also accepts self-pay patients.  [facility] [id number] [place], [place]  [phone]   [place], [address], [place] [phone]   [facility] [address] [place] [phone]   Lucianne Lei [address], [place]   [phone] Only accepts [place] Access [place] patients after they have their name applied to their card.   Self-Pay (no insurance) in [facility]:  Organization         Address  Phone   Notes  Sickle Cell Patients, [facility] [place] [phone]   [facility] [place] [phone]   [facility] [place]  [place], [place], [place] [phone]   [facility]/Dr. Osei-Bonsu  [address], [place] or [address], [place] [phone] Phone number for both [place] and [place] locations is the same.  [facility]gent [facility] and [facility] [address] [place] [phone]   [facility] [address] [place] or [address] [phone]   [facility] [address] [place] [phone], phone; [phone], fax Sees patients 1st and 3rd Saturday of every month.  Must not qualify for public or private insurance (i.e. Medicaid,  Medicare, [place] Health Choice, Veterans' Benefits)  Household income should be no more than 200% of the poverty level The clinic cannot treat you if you are pregnant or think you are pregnant  Sexually transmitted diseases are not treated at the clinic.    Dental Care: Organization         Address  Phone  Notes  [facility] of [facility] [place] [phone] Accepts children up to age 67 who are enrolled in [place] or [place]; pregnant women with a Medicaid card; and children who have applied for Medicaid or [place] Health Choice, but were declined, whose parents can pay a reduced fee at time of service.  [facility] Department of [facility]  [address], [place] [phone] Accepts children up to age 8 who are enrolled in [place] or [place]; pregnant women with a Medicaid card; and children who have applied for Medicaid or [place] Health Choice, but were declined, whose parents can pay a reduced fee at time of service.  [place] Adult Dental Access PROGRAM  [place] [phone] Patients are seen by appointment only. Walk-ins are not accepted. Portal will see patients 49 years of age and older. Monday - Tuesday (8am-5pm) Most Wednesdays (8:30-5pm) $30 per visit, cash only  [facility] Adult Dental Access PROGRAM  [address], [facility] [phone] Patients are seen by appointment only. Walk-ins are not  accepted. [place] will see patients 31 years of age and older. One Wednesday Evening (Monthly: Volunteer Based).  $30 per visit, cash only  [place]  [phone] for adults; Children under age 57, call Graduate Pediatric Dentistry at [phone]. Children aged 48-14, please call [phone] to request a pediatric application.  Dental services are provided in all areas of dental care including fillings, crowns and bridges, complete and partial dentures, implants, gum treatment, root canals, and extractions. Preventive care is also provided. Treatment is provided to both adults and children. Patients are selected via a lottery and there is often a waiting list.   [facility] [address], [place]  [phone] [email or web address]   [facility] [address] [place], [place] [phone], Ext. [phone] Second and Fourth Thursday of each month, opens at 6:30 AM; Clinic ends at 9 AM.  Patients are seen on a first-come first-served basis, and a limited number are seen during each clinic.   [facility]  [address] Hillard Danker [place], [place] [phone]   Eligibility Requirements You must have lived in [place], [place], or [place] counties for at least the last three months.   You cannot be eligible for state or federal sponsored [employer], including [employer], [place], or [employer].   You generally cannot be eligible for healthcare insurance through your employer.    How to apply: Eligibility screenings are held every Tuesday and Wednesday afternoon from 1:00 pm until 4:00 pm. You do not need an appointment for the interview!  [facility] [address] [place], [place]   [place]  [place] Department  [place]  [phone]    Behavioral Health Resources in the  Community: Intensive Outpatient Programs Organization         Address  Phone  Notes  [place]  Services [place] [address] [place], [place] [phone]   [facility] Outpatient [address] [place], [place]   ADS: Alcohol & Drug Svcs [address] [place], [place]   [place] [address],  [place], [place] or [phone]   Substance Abuse Resources Organization         Address  Phone  Notes  Alcohol and Drug Services  [phone]   [place]  [phone]   [place]   Chinita Pester  [phone]   Residential & Outpatient Substance Abuse Program  9066496989   Psychological Services Organization         Address  Phone  Notes  [facility] [place]  [place]  [phone]   [place] [address] [place] or [phone]    Mobile Crisis Teams Organization         Address  Phone  Notes  [facility], Mobile Crisis Care Unit  [phone]   [facility]  [address] [place], [employer]   [place] Levels [address] [place] [phone]    Self-Help/Support Groups Organization         Address  Phone             Notes  [place]. of [place] - variety of support groups  [place] Call for more information  Narcotics Anonymous (NA), [facility] [address], [employer] [place]  2 meetings at this location   Special educational needs teacher         Address  Phone  Notes  [facility] Residential Treatment [place],    [place]  [phone]   [facility]  [address], [place] [address], [place], [place]   [place], [place] [phone] Admissions: 8am-3pm M-F  Incentives Substance [place] [address],    [place], [place] [phone]   [facility] [address] [place], [place], [place]   [facility] [address],  [place], [place]   [facility] Programs - Intensive Outpatient [address] Kristeen [address], [place], [place]   [facility]The ([place].,  [place], [place] [phone] or [phone]   [facility]idential Treatment Services (RTS) [address] [place], [place] Accepts Medicaid  [facility] [place] [address],  [place] [phone] Substance Abuse/Addiction Treatment   [facility] Organization         Address  Phone  Notes  [facility]  [phone]   Domenic Schwab, PhD [address] Arlis Porta [place], [place]   [phone] or [phone]   [place] Lydia [place], [place] [phone]   [facility] [address] [place], [place] [phone] Insurance/Medicaid/sponsorship through [facility] and Families [address] [place]                                    [place], [place] [phone] [place] [address] [place], [place] [phone]    Dr. Adele Schilder  [phone]   [facility] Dept. 1) [address], [place] 2) [place] 3)  [place] [address], [place] [phone]  [phone]   [place] [phone] or [phone] (After Hours)

## 2013-03-21 NOTE — ED Provider Notes (Signed)
Medical screening examination/treatment/procedure(s) were performed by non-physician practitioner and as supervising physician I was immediately available for consultation/collaboration.  EKG Interpretation   None         Ephraim Hamburger, MD 03/21/13 [phone]

## 2013-03-25 ENCOUNTER — Emergency Department (HOSPITAL_COMMUNITY): Payer: BC Managed Care – PPO

## 2013-03-25 ENCOUNTER — Emergency Department (HOSPITAL_COMMUNITY)
Admission: EM | Admit: 2013-03-25 | Discharge: 2013-03-25 | Disposition: A | Discharge status: Home / Self Care | Payer: BC Managed Care – PPO | Attending: Emergency Medicine | Admitting: Emergency Medicine

## 2013-03-25 ENCOUNTER — Encounter (HOSPITAL_COMMUNITY): Payer: Self-pay | Admitting: Emergency Medicine

## 2013-03-25 DIAGNOSIS — R109 Unspecified abdominal pain: Secondary | ICD-10-CM

## 2013-03-25 DIAGNOSIS — Z8669 Personal history of other diseases of the nervous system and sense organs: Secondary | ICD-10-CM | POA: Insufficient documentation

## 2013-03-25 DIAGNOSIS — Z9079 Acquired absence of other genital organ(s): Secondary | ICD-10-CM | POA: Insufficient documentation

## 2013-03-25 DIAGNOSIS — K219 Gastro-esophageal reflux disease without esophagitis: Secondary | ICD-10-CM | POA: Insufficient documentation

## 2013-03-25 DIAGNOSIS — Z9089 Acquired absence of other organs: Secondary | ICD-10-CM | POA: Insufficient documentation

## 2013-03-25 DIAGNOSIS — Z79899 Other long term (current) drug therapy: Secondary | ICD-10-CM | POA: Insufficient documentation

## 2013-03-25 DIAGNOSIS — F329 Major depressive disorder, single episode, unspecified: Secondary | ICD-10-CM | POA: Insufficient documentation

## 2013-03-25 DIAGNOSIS — G4700 Insomnia, unspecified: Secondary | ICD-10-CM | POA: Insufficient documentation

## 2013-03-25 DIAGNOSIS — R000 Tachycardia, unspecified: Secondary | ICD-10-CM | POA: Insufficient documentation

## 2013-03-25 DIAGNOSIS — N200 Calculus of kidney: Secondary | ICD-10-CM | POA: Insufficient documentation

## 2013-03-25 DIAGNOSIS — Z9889 Other specified postprocedural states: Secondary | ICD-10-CM | POA: Insufficient documentation

## 2013-03-25 DIAGNOSIS — R319 Hematuria, unspecified: Secondary | ICD-10-CM

## 2013-03-25 DIAGNOSIS — F3289 Other specified depressive episodes: Secondary | ICD-10-CM | POA: Insufficient documentation

## 2013-03-25 LAB — URINALYSIS, ROUTINE W REFLEX MICROSCOPIC
Bilirubin Urine: NEGATIVE
Glucose, UA: NEGATIVE mg/dL
Ketones, ur: NEGATIVE mg/dL
Leukocytes, UA: NEGATIVE
Nitrite: NEGATIVE
Protein, ur: 30 mg/dL — AB
Specific Gravity, Urine: 1.028 (ref 1.005–1.030)
Urobilinogen, UA: 0.2 mg/dL (ref 0.0–1.0)
pH: 5 (ref 5.0–8.0)

## 2013-03-25 LAB — URINE MICROSCOPIC-ADD ON

## 2013-03-25 MED ORDER — SODIUM CHLORIDE 0.9 % IV BOLUS (SEPSIS)
1000.0000 mL | Freq: Once | INTRAVENOUS | Status: AC
  Administered 2013-03-25: 1000 mL via INTRAVENOUS

## 2013-03-25 MED ORDER — HYDROCODONE-ACETAMINOPHEN 5-325 MG PO TABS: 2.0000 | ORAL_TABLET | ORAL | Status: DC | PRN

## 2013-03-25 MED ORDER — ONDANSETRON 4 MG PO TBDP
ORAL_TABLET | ORAL | Status: DC
Start: 2013-03-25 — End: 2013-04-14

## 2013-03-25 MED ORDER — KETOROLAC TROMETHAMINE 15 MG/ML IJ SOLN
30.0000 mg | Freq: Once | INTRAMUSCULAR | Status: AC
  Administered 2013-03-25: 30 mg via INTRAVENOUS
  Filled 2013-03-25: qty 2

## 2013-03-25 MED ORDER — HYDROMORPHONE HCL PF 1 MG/ML IJ SOLN
1.0000 mg | Freq: Once | INTRAMUSCULAR | Status: AC
  Administered 2013-03-25: 1 mg via INTRAVENOUS
  Filled 2013-03-25: qty 1

## 2013-03-25 NOTE — ED Notes (Signed)
Pt presents to ed with c/o flank pain, sts was diagnosed with kidney stones last week, today develop excruciating pain. Pt is tachy with HR 158. Pain 10/10

## 2013-03-25 NOTE — Discharge Instructions (Signed)
If you were given medicines take as directed.  If you are on coumadin or contraceptives realize their levels and effectiveness is altered by many different medicines.  If you have any reaction (rash, tongues swelling, other) to the medicines stop taking and see a physician.   °Please follow up as directed and return to the ER or see a physician for new or worsening symptoms.  Thank you. °For severe pain take norco or vicodin however realize they have the potential for addiction and it can make you sleepy and has tylenol in it.  No operating machinery while taking. ° °

## 2013-03-25 NOTE — ED Notes (Signed)
Made patient aware that we need a urine sample he stated he is unable to give one at this time.

## 2013-03-25 NOTE — ED Provider Notes (Signed)
CSN: [id number]     Arrival date & time 03/25/13  1617 History   First MD Initiated Contact with Patient 03/25/13 1630     Chief Complaint  Patient presents with  . Flank Pain  . Tachycardia     (Consider location/radiation/quality/duration/timing/severity/associated sxs/prior Treatment) HPI Comments: 33 yo male with cholecystectomy, left orchiectomy, multiple kidney stones, lithotripsy presents with intermittent left flank pain for over a week, worsened acutely 1 hr PTA.  Pt recently was seen by urology and referred to pain physician. Pt on 40 mg norco per day.  Pt had orchiectomy to see if would help with chronic pain.   No hematuria, mild nausea.  Similar to previous. Radiates to penis.  Decreased urination.  No fevers.  No UTI hx.  Patient is a 33 y.o. male presenting with flank pain. The history is provided by the patient.  Flank Pain This is a recurrent problem. Pertinent negatives include no chest pain, no abdominal pain, no headaches and no shortness of breath.    Past Medical History  Diagnosis Date  . GERD (gastroesophageal reflux disease)   . History of kidney stones   . Seizures Childhood  . Depression     and panic/anxiety  . Insomnia   . Kidney stones    Past Surgical History  Procedure Laterality Date  . Lithotripsy      four times over the years; most recent 10/08/12  . Appendectomy  2011  . Laparoscopic cholecystectomy  1996  . Tonsillectomy  1992  . Wrist ganglion excision  2002    Right  . Hydrocele excision Left 06/2012  . Orchiectomy Left 11/2012   Family History  Problem Relation Age of Onset  . Stroke Mother   . Liver disease Mother   . Irritable bowel syndrome Mother   . Kidney disease Mother   . Heart disease Father   . Colon polyps Father   . Colon cancer Neg Hx    History  Substance Use Topics  . Smoking status: Never Smoker   . Smokeless tobacco: Never Used  . Alcohol Use: Yes     Comment: rarely    Review of Systems   Constitutional: Negative for fever and chills.  HENT: Negative for congestion.   Eyes: Negative for visual disturbance.  Respiratory: Negative for shortness of breath.   Cardiovascular: Negative for chest pain.  Gastrointestinal: Positive for nausea. Negative for vomiting and abdominal pain.  Genitourinary: Positive for flank pain and difficulty urinating. Negative for dysuria.  Musculoskeletal: Negative for back pain, neck pain and neck stiffness.  Skin: Negative for rash.  Neurological: Negative for light-headedness and headaches.      Allergies  Oxycodone  Home Medications   Current Outpatient Rx  Name  Route  Sig  Dispense  Refill  . docusate sodium (COLACE) 100 MG capsule   Oral   Take 100 mg by mouth 2 (two) times daily.         Marland Kitchen HYDROcodone-acetaminophen (NORCO) 10-325 MG per tablet   Oral   Take 1-2 tablets by mouth every 6 (six) hours as needed for severe pain (pain).         Marland Kitchen ibuprofen (ADVIL,MOTRIN) 400 MG tablet   Oral   Take 800 mg by mouth every 6 (six) hours as needed (pain).         . ondansetron (ZOFRAN ODT) 4 MG disintegrating tablet   Oral   Take 1 tablet (4 mg total) by mouth every 8 (eight) hours as needed  for nausea or vomiting.   20 tablet   2   . pantoprazole (PROTONIX) 40 MG tablet   Oral   Take 40 mg by mouth 2 (two) times daily.         Marland Kitchen PRESCRIPTION MEDICATION   Perianal   Place 1 application around the anus 2 (two) times daily. Diltiazem 2% gel to heal anal fissures         . tamsulosin (FLOMAX) 0.4 MG CAPS capsule   Oral   Take 0.4 mg by mouth every morning.          . zolpidem (AMBIEN) 10 MG tablet   Oral   Take 1 tablet (10 mg total) by mouth at bedtime. Fill on/after 01/08/13   30 tablet   1   . tamsulosin (FLOMAX) 0.4 MG CAPS capsule   Oral   Take 1 capsule (0.4 mg total) by mouth daily.   15 capsule   0    BP 126/111  Pulse 158  Temp(Src) 98.1 F (36.7 C) (Oral)  Resp 22  SpO2 98% Physical Exam   Nursing note and vitals reviewed. Constitutional: He is oriented to person, place, and time. He appears well-developed and well-nourished.  HENT:  Head: Normocephalic and atraumatic.  Mild dry mm  Eyes: Conjunctivae are normal. Right eye exhibits no discharge. Left eye exhibits no discharge.  Neck: Normal range of motion. Neck supple. No tracheal deviation present.  Cardiovascular: Regular rhythm.  Tachycardia present.   Pulmonary/Chest: Effort normal and breath sounds normal.  Abdominal: Soft. He exhibits no distension. There is no tenderness. There is no guarding.  Musculoskeletal: He exhibits tenderness (mild left mid flank). He exhibits no edema.  Neurological: He is alert and oriented to person, place, and time.  Skin: Skin is warm. No rash noted.  Psychiatric: He has a normal mood and affect.    ED Course  Procedures (including critical care time) Emergency Focused Ultrasound Exam Limited retroperitoneal ultrasound of kidneys  Performed and interpreted by Dr. Reather Converse Indication: flank pain Focused abdominal ultrasound with both kidneys imaged in transverse and longitudinal planes in real-time. Interpretation: No significant hydronephrosis visualized.  Small stone in left kidney Images archived electronically Limited Ultrasound of bladder  Performed by Dr. Reather Converse Indication: to assess for urinary retention/ difficulty urinating Technique:  Low frequency probe utilized in two planes to assess bladder volume in real-time. Findings: Bladder volume minimal < 20 ml Images were archived electronically  Labs Review Labs Reviewed  URINALYSIS, ROUTINE W REFLEX MICROSCOPIC - Abnormal; Notable for the following:    Color, Urine AMBER (*)    APPearance CLOUDY (*)    Hgb urine dipstick LARGE (*)    Protein, ur 30 (*)    All other components within normal limits  URINE MICROSCOPIC-ADD ON - Abnormal; Notable for the following:    Bacteria, UA FEW (*)    All other components within  normal limits   Imaging Review Dg Abd 1 View  03/25/2013   CLINICAL DATA:  History of kidney stones now with left-sided flank pain.  EXAM: ABDOMEN - 1 VIEW  COMPARISON:  CT ABD/PELV WO CM dated 03/19/2013  FINDINGS: The bowel gas pattern is unremarkable. There surgical clips in the gallbladder fossa. The known proximal left ureteral stone is not clearly evident. A stone in the midpole of the left kidney may be present. No definite stone is demonstrated in the bladder. There are phleboliths on the right within the true bony pelvis. No right-sided urinary tract stone  is demonstrated.  IMPRESSION: No definite calcified ureteral stone is demonstrated. There may be a 2 mm diameter stone projecting over the midpole of the left kidney.   Electronically Signed   By: David  Martinique   On: 03/25/2013 18:56     EKG Interpretation None      MDM   Final diagnoses:  Kidney stone on left side  Left flank pain  Hematuria   Acute flank pain, clinically stone especially with hx. Discussed r/b of CT pt has had 3 this year, he is okay with holding as known stones. Bedside US no signfiicant hydronephrosis, small stone seen in kidney. No urinary retention, pt just urinated, bladder empty. Pain meds in ED IV, fluids IV.  Discussed outpt fup with urology. Pt improved in ED.  Xray reviewed.  Filed Vitals:   03/25/13 1715 03/25/13 1730 03/25/13 1745 03/25/13 1819  BP: 116/54 121/76 112/82 126/80  Pulse: 91 98 130 82  Temp:      TempSrc:      Resp: 11 7 13 16   SpO2: 94% 93% 97% 96%    Results and differential diagnosis were discussed with the patient. Close follow up outpatient was discussed, patient comfortable with the plan.        Mariea Clonts, MD 03/25/13 [phone]

## 2013-03-26 ENCOUNTER — Telehealth: Payer: Self-pay | Admitting: Family Medicine

## 2013-03-26 NOTE — Telephone Encounter (Signed)
There isn't anything stronger we can call in.  He'll have to come out here anyway.  I would try to get him on the schedule.

## 2013-03-26 NOTE — Telephone Encounter (Signed)
Pt was recently at [facility] for bad kidney stones and is needing to come in for a visit as soon as possible. Pt is needing to discuss medications. Or if something could be called into his pharmacy pt says he will only take perscribed medications from Dr. Damita Dunnings and is needing some ASAP for the pain.

## 2013-03-26 NOTE — Telephone Encounter (Signed)
Appointment scheduled.

## 2013-03-28 ENCOUNTER — Ambulatory Visit (INDEPENDENT_AMBULATORY_CARE_PROVIDER_SITE_OTHER): Payer: BC Managed Care – PPO | Admitting: Family Medicine

## 2013-03-28 ENCOUNTER — Encounter: Payer: Self-pay | Admitting: Family Medicine

## 2013-03-28 VITALS — BP 152/98 | HR 91 | Temp 98.2°F | Wt 299.5 lb

## 2013-03-28 DIAGNOSIS — R109 Unspecified abdominal pain: Secondary | ICD-10-CM

## 2013-03-28 DIAGNOSIS — R1030 Lower abdominal pain, unspecified: Secondary | ICD-10-CM

## 2013-03-28 MED ORDER — HYDROCODONE-ACETAMINOPHEN 10-325 MG PO TABS: 1.5000 | ORAL_TABLET | Freq: Four times a day (QID) | ORAL | Status: DC | PRN

## 2013-03-28 NOTE — Patient Instructions (Addendum)
Call the pain clinic about the appointment.  Get the 24h urine to the urology clinic. Take care.

## 2013-03-28 NOTE — Progress Notes (Signed)
Pre visit review using our clinic review tool, if applicable. No additional management support is needed unless otherwise documented below in the visit note.  He was able to go the uro visit.  Then the pain increased significantly.  Profound groin pain, was given meds at ER and able to go home.  This was an "awful" episode of pain and passed another stone.  He has a 24 urine collection pending.  He is going to go to a pain clinic in [facility].  He was taking 4-6 pain pills per day.  He has been constipated and had some anal fissures.  He is really irritated in the perirectal area.  It is some better with hemorrhoid cream and talc.    Meds, vitals, and allergies reviewed.   ROS: See HPI.  Otherwise, noncontributory.  nad Uncomfortable Perirectal area irritated but w/u fissure, hemorrhoid or fluctuant mass noted

## 2013-03-29 NOTE — Assessment & Plan Note (Signed)
Has uro f/u pending, along with pain clinic pending.  D/w pt about constipation cautions. Perirectal skin improved, continue as is for now.  He agrees.  I am treatment his pain in good faith for what consistently appears to be a painful, persistent, and occ excruciating problem.

## 2013-04-14 ENCOUNTER — Emergency Department (HOSPITAL_COMMUNITY): Payer: BC Managed Care – PPO

## 2013-04-14 ENCOUNTER — Emergency Department (HOSPITAL_COMMUNITY)
Admission: EM | Admit: 2013-04-14 | Discharge: 2013-04-14 | Disposition: A | Discharge status: Home / Self Care | Payer: BC Managed Care – PPO | Attending: Emergency Medicine | Admitting: Emergency Medicine

## 2013-04-14 ENCOUNTER — Encounter (HOSPITAL_COMMUNITY): Payer: Self-pay | Admitting: Emergency Medicine

## 2013-04-14 DIAGNOSIS — F3289 Other specified depressive episodes: Secondary | ICD-10-CM | POA: Insufficient documentation

## 2013-04-14 DIAGNOSIS — K219 Gastro-esophageal reflux disease without esophagitis: Secondary | ICD-10-CM | POA: Insufficient documentation

## 2013-04-14 DIAGNOSIS — F329 Major depressive disorder, single episode, unspecified: Secondary | ICD-10-CM | POA: Insufficient documentation

## 2013-04-14 DIAGNOSIS — R109 Unspecified abdominal pain: Secondary | ICD-10-CM | POA: Insufficient documentation

## 2013-04-14 DIAGNOSIS — Z87442 Personal history of urinary calculi: Secondary | ICD-10-CM | POA: Insufficient documentation

## 2013-04-14 LAB — URINALYSIS, ROUTINE W REFLEX MICROSCOPIC
Bilirubin Urine: NEGATIVE
Glucose, UA: NEGATIVE mg/dL
Hgb urine dipstick: NEGATIVE
Ketones, ur: NEGATIVE mg/dL
Leukocytes, UA: NEGATIVE
Nitrite: NEGATIVE
Protein, ur: NEGATIVE mg/dL
Specific Gravity, Urine: 1.02 (ref 1.005–1.030)
Urobilinogen, UA: 0.2 mg/dL (ref 0.0–1.0)
pH: 6.5 (ref 5.0–8.0)

## 2013-04-14 LAB — CBC WITH DIFFERENTIAL/PLATELET
Basophils Absolute: 0 10*3/uL (ref 0.0–0.1)
Basophils Relative: 0 % (ref 0–1)
Eosinophils Absolute: 0.2 10*3/uL (ref 0.0–0.7)
Eosinophils Relative: 2 % (ref 0–5)
HCT: 46 % (ref 39.0–52.0)
Hemoglobin: 16.4 g/dL (ref 13.0–17.0)
Lymphocytes Relative: 24 % (ref 12–46)
Lymphs Abs: 1.9 10*3/uL (ref 0.7–4.0)
MCH: 30.8 pg (ref 26.0–34.0)
MCHC: 35.7 g/dL (ref 30.0–36.0)
MCV: 86.3 fL (ref 78.0–100.0)
Monocytes Absolute: 0.8 10*3/uL (ref 0.1–1.0)
Monocytes Relative: 10 % (ref 3–12)
Neutro Abs: 4.9 10*3/uL (ref 1.7–7.7)
Neutrophils Relative %: 64 % (ref 43–77)
Platelets: 295 10*3/uL (ref 150–400)
RBC: 5.33 MIL/uL (ref 4.22–5.81)
RDW: 13.2 % (ref 11.5–15.5)
WBC: 7.7 10*3/uL (ref 4.0–10.5)

## 2013-04-14 LAB — BASIC METABOLIC PANEL
BUN: 12 mg/dL (ref 6–23)
CO2: 28 mEq/L (ref 19–32)
Calcium: 9.8 mg/dL (ref 8.4–10.5)
Chloride: 106 mEq/L (ref 96–112)
Creatinine, Ser: 0.83 mg/dL (ref 0.50–1.35)
GFR calc Af Amer: >90 mL/min (ref >90)
GFR calc non Af Amer: >90 mL/min (ref >90)
Glucose, Bld: 101 mg/dL — ABNORMAL HIGH (ref 70–99)
Potassium: 4.8 mEq/L (ref 3.7–5.3)
Sodium: 146 mEq/L (ref 137–147)

## 2013-04-14 MED ORDER — HYDROMORPHONE HCL PF 1 MG/ML IJ SOLN
1.0000 mg | Freq: Once | INTRAMUSCULAR | Status: AC
  Administered 2013-04-14: 1 mg via INTRAVENOUS
  Filled 2013-04-14: qty 1

## 2013-04-14 MED ORDER — ONDANSETRON HCL 4 MG/2ML IJ SOLN
4.0000 mg | Freq: Once | INTRAMUSCULAR | Status: AC
  Administered 2013-04-14: 4 mg via INTRAVENOUS
  Filled 2013-04-14: qty 2

## 2013-04-14 MED ORDER — SODIUM CHLORIDE 0.9 % IV BOLUS (SEPSIS)
1000.0000 mL | Freq: Once | INTRAVENOUS | Status: AC
  Administered 2013-04-14: 1000 mL via INTRAVENOUS

## 2013-04-14 NOTE — Discharge Instructions (Signed)

## 2013-04-14 NOTE — ED Notes (Signed)
Pt states pain decreased but is now returning.

## 2013-04-14 NOTE — ED Provider Notes (Signed)
CSN: [id number]     Arrival date & time 04/14/13  0901 History   First MD Initiated Contact with Patient 04/14/13 [phone]     Chief Complaint  Patient presents with  . Flank Pain     (Consider location/radiation/quality/duration/timing/severity/associated sxs/prior Treatment) Patient is a 33 y.o. male presenting with flank pain.  Flank Pain This is a chronic problem. Episode onset: worsened several days ago. The problem occurs constantly. The problem has been gradually worsening. Associated symptoms include abdominal pain. Pertinent negatives include no chest pain and no shortness of breath. Associated symptoms comments: No fever, no hematuria. Nothing aggravates the symptoms. Nothing relieves the symptoms. He has tried nothing (hydrocodone) for the symptoms. The treatment provided no relief.    Past Medical History  Diagnosis Date  . GERD (gastroesophageal reflux disease)   . History of kidney stones   . Seizures Childhood  . Depression     and panic/anxiety  . Insomnia   . Kidney stones    Past Surgical History  Procedure Laterality Date  . Lithotripsy      four times over the years; most recent 10/08/12  . Appendectomy  2011  . Laparoscopic cholecystectomy  1996  . Tonsillectomy  1992  . Wrist ganglion excision  2002    Right  . Hydrocele excision Left 06/2012  . Orchiectomy Left 11/2012   Family History  Problem Relation Age of Onset  . Stroke Mother   . Liver disease Mother   . Irritable bowel syndrome Mother   . Kidney disease Mother   . Heart disease Father   . Colon polyps Father   . Colon cancer Neg Hx    History  Substance Use Topics  . Smoking status: Never Smoker   . Smokeless tobacco: Never Used  . Alcohol Use: Yes     Comment: rarely    Review of Systems  Constitutional: Negative for fever.  HENT: Negative for congestion.   Respiratory: Negative for cough and shortness of breath.   Cardiovascular: Negative for chest pain.  Gastrointestinal:  Positive for abdominal pain. Negative for nausea, vomiting and diarrhea.  Genitourinary: Positive for flank pain.  All other systems reviewed and are negative.      Allergies  Oxycodone  Home Medications   Current Outpatient Rx  Name  Route  Sig  Dispense  Refill  . docusate sodium (COLACE) 100 MG capsule   Oral   Take 100 mg by mouth 2 (two) times daily.         Marland Kitchen HYDROcodone-acetaminophen (NORCO) 10-325 MG per tablet   Oral   Take 1.5 tablets by mouth every 6 (six) hours as needed for severe pain.   120 tablet   0   . ondansetron (ZOFRAN-ODT) 4 MG disintegrating tablet   Oral   Take 4 mg by mouth every 4 (four) hours as needed for nausea or vomiting.         . pantoprazole (PROTONIX) 40 MG tablet   Oral   Take 40 mg by mouth 2 (two) times daily.         . tamsulosin (FLOMAX) 0.4 MG CAPS capsule   Oral   Take 1 capsule (0.4 mg total) by mouth daily.   15 capsule   0   . zolpidem (AMBIEN) 10 MG tablet   Oral   Take 1 tablet (10 mg total) by mouth at bedtime. Fill on/after 01/08/13   30 tablet   1    BP 145/106  Pulse 80  Temp(Src) 98.6 F (37 C) (Oral)  Resp 20  SpO2 96% Physical Exam  Nursing note and vitals reviewed. Constitutional: He is oriented to person, place, and time. He appears well-developed and well-nourished. No distress.  HENT:  Head: Normocephalic and atraumatic.  Mouth/Throat: Oropharynx is clear and moist.  Eyes: Conjunctivae are normal. Pupils are equal, round, and reactive to light. No scleral icterus.  Neck: Neck supple.  Cardiovascular: Normal rate, regular rhythm, normal heart sounds and intact distal pulses.   No murmur heard. Pulmonary/Chest: Effort normal and breath sounds normal. No stridor. No respiratory distress. He has no wheezes. He has no rales.  Abdominal: Soft. He exhibits no distension. There is no tenderness.  Genitourinary: Right testis shows no mass and no tenderness.  S/p left orchiectomy  Musculoskeletal:  Normal range of motion. He exhibits no edema.       Back:  Neurological: He is alert and oriented to person, place, and time.  Skin: Skin is warm and dry. No rash noted.  Psychiatric: He has a normal mood and affect. His behavior is normal.    ED Course  Procedures (including critical care time) Labs Review Labs Reviewed  BASIC METABOLIC PANEL - Abnormal; Notable for the following:    Glucose, Bld 101 (*)    All other components within normal limits  CBC WITH DIFFERENTIAL  URINALYSIS, ROUTINE W REFLEX MICROSCOPIC   Imaging Review US Renal  04/14/2013   CLINICAL DATA:  Bilateral flank pain, history kidney stones  EXAM: RENAL/URINARY TRACT ULTRASOUND COMPLETE  COMPARISON:  None.  FINDINGS: Right Kidney:  Length: 12.4 cm. Grossly normal cortical thickness and echogenicity. No mass, hydronephrosis or shadowing calcification.  Left Kidney:  Length: 13.4 cm. Grossly normal cortical thickness and echogenicity. No mass, hydronephrosis or shadowing calcification.  Bladder:  Appears normal for degree of bladder distention.  Degradation of image quality secondary to body habitus.  IMPRESSION: No renal sonographic abnormalities identified.   Electronically Signed   By: Lavonia Dana M.D.   On: 04/14/2013 12:20     EKG Interpretation None      MDM   Final diagnoses:  Flank pain    33 yo male with hx of kidney stones and chronic flank pain presenting with the same.  He appears very knowledgeable about kidney stones and reports which type of stones he typically has and why they may or may not show up on imaging. He also showed me two vials of stones that he states he has passed.    His workup is negative.  His pain was reasonably controlled after IV dilaudid, Zofran, Fluids.  Plan for continued outpatient management of his chronic flank pain.    Houston Siren III, MD 04/14/13 [phone]

## 2013-04-14 NOTE — ED Notes (Signed)
Notified MD, pt requesting meds.

## 2013-04-14 NOTE — ED Notes (Signed)
He c/o bilat. Flank pain.  He has had numerous kidney stones in the past; and numerous CT's and procedures.  He also c/o left groin pain; and he underwent left orchiectomy r/t pain. He is in no distress.  He states his home Vicodin is insufficient for his current pain.

## 2013-04-14 NOTE — ED Notes (Signed)
Pt states that he would rather wait to have labs drawn from IV

## 2013-04-19 ENCOUNTER — Encounter: Payer: Self-pay | Admitting: Family Medicine

## 2013-04-19 ENCOUNTER — Ambulatory Visit (INDEPENDENT_AMBULATORY_CARE_PROVIDER_SITE_OTHER): Payer: BC Managed Care – PPO | Admitting: Family Medicine

## 2013-04-19 VITALS — BP 146/98 | HR 83 | Temp 97.7°F | Wt 294.0 lb

## 2013-04-19 DIAGNOSIS — J0100 Acute maxillary sinusitis, unspecified: Secondary | ICD-10-CM | POA: Insufficient documentation

## 2013-04-19 DIAGNOSIS — R059 Cough, unspecified: Secondary | ICD-10-CM

## 2013-04-19 DIAGNOSIS — N200 Calculus of kidney: Secondary | ICD-10-CM

## 2013-04-19 DIAGNOSIS — R05 Cough: Secondary | ICD-10-CM

## 2013-04-19 DIAGNOSIS — J019 Acute sinusitis, unspecified: Secondary | ICD-10-CM

## 2013-04-19 DIAGNOSIS — G4700 Insomnia, unspecified: Secondary | ICD-10-CM

## 2013-04-19 LAB — POCT INFLUENZA A/B
Influenza A, POC: NEGATIVE
Influenza B, POC: NEGATIVE

## 2013-04-19 MED ORDER — AMOXICILLIN-POT CLAVULANATE 875-125 MG PO TABS: 1.0000 | ORAL_TABLET | Freq: Two times a day (BID) | ORAL | Status: DC

## 2013-04-19 MED ORDER — HYDROCODONE-ACETAMINOPHEN 10-325 MG PO TABS: 1.5000 | ORAL_TABLET | Freq: Four times a day (QID) | ORAL | Status: DC | PRN

## 2013-04-19 MED ORDER — ZOLPIDEM TARTRATE 10 MG PO TABS: 10.0000 mg | ORAL_TABLET | Freq: Every day | ORAL | Status: DC

## 2013-04-19 NOTE — Assessment & Plan Note (Signed)
Continue ambien, no ADE.  

## 2013-04-19 NOTE — Progress Notes (Signed)
Pre visit review using our clinic review tool, if applicable. No additional management support is needed unless otherwise documented below in the visit note.  ST, cold sx, voice changes.  Likely sick exposures at work.  Fevers, chills.  Recently with diarrhea, cough.  Cough was progressive.  Rhinorrhea vs congestion, alternating.  He had to cancel work recently.  Some facial pain and stuffy, headaches.  He had some dry facial skin recently.    He had f/u with urology.  Prev ER notes reviewed.  He has passed more stones in the interval.  He has f/u with the pain clinic pending, about 2 weeks from now.  He has 1 week of medicine left, as expected.    He has continued insomnia, some relief with ambien. No ADE, needs refill.   Meds, vitals, and allergies reviewed.   ROS: See HPI.  Otherwise, noncontributory.  GEN: nad, alert and oriented HEENT: mucous membranes moist, tm w/o erythema, nasal exam w/o erythema, clear discharge noted,  OP with cobblestoning, R max sinus ttp NECK: supple w/o LA CV: rrr.   PULM: ctab, no inc wob EXT: no edema SKIN: no acute rash

## 2013-04-19 NOTE — Patient Instructions (Signed)
Start the antibiotics today and this should gradually improve.  Take care.  I'll await the pain clinic notes.

## 2013-04-19 NOTE — Assessment & Plan Note (Signed)
Nontoxic, flu neg.  Start augmentin given the exam and timeline.  He agrees.  F/u prn.

## 2013-04-19 NOTE — Assessment & Plan Note (Signed)
Continue current meds, pain clinic eval pending.

## 2013-05-02 ENCOUNTER — Telehealth: Payer: Self-pay | Admitting: *Deleted

## 2013-05-02 NOTE — Telephone Encounter (Signed)
Nicki Reaper, pharmacist at [facility] in [place], says there was a mis-fill on this patient's prescription on the pharmacy end.  A Rx for Norco 10-325 was submitted and Oxycodone was dispensed by mistake.  The patient tested positive for Oxy at his Pain Mgmt office and was understandably surprised, thus the mistake was discovered.  Pharmacist says he will be happy to speak with you about it if you desire but is mainly reporting the incident to you as the prescribing MD and their Risk Mgmt folks are getting involved also.

## 2013-05-02 NOTE — Telephone Encounter (Signed)
I don't see an need for [place] to be involved.  I am available if needed.

## 2013-05-09 ENCOUNTER — Emergency Department (HOSPITAL_COMMUNITY)
Admission: EM | Admit: 2013-05-09 | Discharge: 2013-05-09 | Disposition: A | Discharge status: Home / Self Care | Payer: BC Managed Care – PPO | Attending: Emergency Medicine | Admitting: Emergency Medicine

## 2013-05-09 ENCOUNTER — Encounter (HOSPITAL_COMMUNITY): Payer: Self-pay | Admitting: Emergency Medicine

## 2013-05-09 DIAGNOSIS — R109 Unspecified abdominal pain: Secondary | ICD-10-CM

## 2013-05-09 DIAGNOSIS — G40909 Epilepsy, unspecified, not intractable, without status epilepticus: Secondary | ICD-10-CM | POA: Insufficient documentation

## 2013-05-09 DIAGNOSIS — Z79899 Other long term (current) drug therapy: Secondary | ICD-10-CM | POA: Insufficient documentation

## 2013-05-09 DIAGNOSIS — N200 Calculus of kidney: Secondary | ICD-10-CM | POA: Insufficient documentation

## 2013-05-09 DIAGNOSIS — G8929 Other chronic pain: Secondary | ICD-10-CM | POA: Insufficient documentation

## 2013-05-09 DIAGNOSIS — K219 Gastro-esophageal reflux disease without esophagitis: Secondary | ICD-10-CM | POA: Insufficient documentation

## 2013-05-09 DIAGNOSIS — F411 Generalized anxiety disorder: Secondary | ICD-10-CM | POA: Insufficient documentation

## 2013-05-09 DIAGNOSIS — R630 Anorexia: Secondary | ICD-10-CM | POA: Insufficient documentation

## 2013-05-09 DIAGNOSIS — G4700 Insomnia, unspecified: Secondary | ICD-10-CM | POA: Insufficient documentation

## 2013-05-09 LAB — BASIC METABOLIC PANEL
BUN: 10 mg/dL (ref 6–23)
CO2: 25 mEq/L (ref 19–32)
Calcium: 9.9 mg/dL (ref 8.4–10.5)
Chloride: 102 mEq/L (ref 96–112)
Creatinine, Ser: 0.86 mg/dL (ref 0.50–1.35)
GFR calc Af Amer: >90 mL/min (ref >90)
GFR calc non Af Amer: >90 mL/min (ref >90)
Glucose, Bld: 96 mg/dL (ref 70–99)
Potassium: 4.3 mEq/L (ref 3.7–5.3)
Sodium: 141 mEq/L (ref 137–147)

## 2013-05-09 LAB — URINALYSIS, ROUTINE W REFLEX MICROSCOPIC
Bilirubin Urine: NEGATIVE
Glucose, UA: NEGATIVE mg/dL
Hgb urine dipstick: NEGATIVE
Ketones, ur: NEGATIVE mg/dL
Leukocytes, UA: NEGATIVE
Nitrite: NEGATIVE
Protein, ur: NEGATIVE mg/dL
Specific Gravity, Urine: 1.024 (ref 1.005–1.030)
Urobilinogen, UA: 0.2 mg/dL (ref 0.0–1.0)
pH: 6 (ref 5.0–8.0)

## 2013-05-09 MED ORDER — SODIUM CHLORIDE 0.9 % IV BOLUS (SEPSIS)
1000.0000 mL | Freq: Once | INTRAVENOUS | Status: AC
  Administered 2013-05-09: 1000 mL via INTRAVENOUS

## 2013-05-09 MED ORDER — HYDROMORPHONE HCL PF 1 MG/ML IJ SOLN
1.0000 mg | Freq: Once | INTRAMUSCULAR | Status: AC
  Administered 2013-05-09: 1 mg via INTRAVENOUS
  Filled 2013-05-09: qty 1

## 2013-05-09 MED ORDER — ONDANSETRON 4 MG PO TBDP
ORAL_TABLET | ORAL | Status: DC
Start: 2013-05-09 — End: 2013-05-17

## 2013-05-09 MED ORDER — ONDANSETRON HCL 4 MG/2ML IJ SOLN
4.0000 mg | Freq: Once | INTRAMUSCULAR | Status: AC
  Administered 2013-05-09: 4 mg via INTRAVENOUS
  Filled 2013-05-09: qty 2

## 2013-05-09 MED ORDER — KETOROLAC TROMETHAMINE 15 MG/ML IJ SOLN
15.0000 mg | Freq: Once | INTRAMUSCULAR | Status: AC
  Administered 2013-05-09: 15 mg via INTRAVENOUS
  Filled 2013-05-09: qty 1

## 2013-05-09 NOTE — Discharge Instructions (Signed)
If you were given medicines take as directed.  If you are on coumadin or contraceptives realize their levels and effectiveness is altered by many different medicines.  If you have any reaction (rash, tongues swelling, other) to the medicines stop taking and see a physician.   Please follow up as directed and return to the ER or see a physician for new or worsening symptoms.  Thank you. Filed Vitals:   05/09/13 1212  BP: 148/104  Pulse: 71  Temp: 97.7 F (36.5 C)  TempSrc: Oral  Resp: 20  SpO2: 95%

## 2013-05-09 NOTE — ED Notes (Signed)
Pt c/o left and right flank pain, does have hx of kidney stones. C/o nausea but not vomiting.

## 2013-05-09 NOTE — ED Provider Notes (Signed)
CSN: [id number]     Arrival date & time 05/09/13  1208 History   First MD Initiated Contact with Patient 05/09/13 1216     Chief Complaint  Patient presents with  . Flank Pain    left and right     (Consider location/radiation/quality/duration/timing/severity/associated sxs/prior Treatment) HPI Comments: 33 year old male with recurrent kidney stone history, lithotripsy x4, multiple CT scans in the past, chronic pain and pain specialist, urology followup outpatient, left orchiectomy presents with right and left flank pain intermittent past few days. Severe with minimal improvement with his home narcotics. No fevers. mild radiation towards the groin.  Patient is a 33 y.o. male presenting with flank pain. The history is provided by the patient.  Flank Pain This is a recurrent problem. Pertinent negatives include no chest pain, no abdominal pain, no headaches and no shortness of breath.    Past Medical History  Diagnosis Date  . GERD (gastroesophageal reflux disease)   . History of kidney stones   . Seizures Childhood  . Depression     and panic/anxiety  . Insomnia   . Kidney stones    Past Surgical History  Procedure Laterality Date  . Lithotripsy      four times over the years; most recent 10/08/12  . Appendectomy  2011  . Laparoscopic cholecystectomy  1996  . Tonsillectomy  1992  . Wrist ganglion excision  2002    Right  . Hydrocele excision Left 06/2012  . Orchiectomy Left 11/2012   Family History  Problem Relation Age of Onset  . Stroke Mother   . Liver disease Mother   . Irritable bowel syndrome Mother   . Kidney disease Mother   . Heart disease Father   . Colon polyps Father   . Colon cancer Neg Hx    History  Substance Use Topics  . Smoking status: Never Smoker   . Smokeless tobacco: Never Used  . Alcohol Use: Yes     Comment: rarely    Review of Systems  Constitutional: Positive for appetite change. Negative for fever and chills.  Respiratory: Negative  for shortness of breath.   Cardiovascular: Negative for chest pain.  Gastrointestinal: Positive for nausea. Negative for vomiting and abdominal pain.  Genitourinary: Positive for flank pain. Negative for dysuria.  Musculoskeletal: Negative for back pain, neck pain and neck stiffness.  Skin: Negative for rash.  Neurological: Negative for light-headedness and headaches.      Allergies  Review of patient's allergies indicates no known allergies.  Home Medications   Prior to Admission medications   Medication Sig Start Date End Date Taking? Authorizing Provider  amoxicillin-clavulanate (AUGMENTIN) 875-125 MG per tablet Take 1 tablet by mouth 2 (two) times daily. 04/19/13  Yes Tonia Ghent, MD  docusate sodium (COLACE) 100 MG capsule Take 100 mg by mouth as needed.    Yes Historical Provider, MD  gabapentin (NEURONTIN) 300 MG capsule Take 300-900 mg by mouth at bedtime. 300 mg at bedtime 600 mg at bedtime x 5 days. Start 4/16-4/21 900 mg at bedtime until discontinued. Start on 4/22   Yes Historical Provider, MD  HYDROcodone-acetaminophen (NORCO) 10-325 MG per tablet Take 1.5 tablets by mouth every 6 (six) hours as needed for severe pain. Fill on/after 03/25/13. 04/19/13  Yes Tonia Ghent, MD  ondansetron (ZOFRAN-ODT) 4 MG disintegrating tablet Take 4 mg by mouth every 4 (four) hours as needed for nausea or vomiting.   Yes Historical Provider, MD  pantoprazole (PROTONIX) 40 MG tablet Take  40 mg by mouth 2 (two) times daily.   Yes Historical Provider, MD  tamsulosin (FLOMAX) 0.4 MG CAPS capsule Take 1 capsule (0.4 mg total) by mouth daily. 03/20/13  Yes Marissa Sciacca, PA-C  zolpidem (AMBIEN) 10 MG tablet Take 1 tablet (10 mg total) by mouth at bedtime. 04/19/13  Yes Tonia Ghent, MD   BP 148/104  Pulse 71  Temp(Src) 97.7 F (36.5 C) (Oral)  Resp 20  SpO2 95% Physical Exam  Nursing note and vitals reviewed. Constitutional: He is oriented to person, place, and time. He appears  well-developed and well-nourished.  HENT:  Head: Normocephalic and atraumatic.  Eyes: Conjunctivae are normal. Right eye exhibits no discharge. Left eye exhibits no discharge.  Neck: Normal range of motion. Neck supple. No tracheal deviation present.  Cardiovascular: Normal rate and regular rhythm.   Pulmonary/Chest: Effort normal and breath sounds normal.  Abdominal: Soft. He exhibits no distension. There is tenderness (mild bilateral upper flanks). There is no guarding.  Musculoskeletal: He exhibits tenderness (mild bilateral flanks). He exhibits no edema.  Neurological: He is alert and oriented to person, place, and time.  Skin: Skin is warm. No rash noted.  Psychiatric: He has a normal mood and affect.    ED Course  Procedures (including critical care time) Emergency Focused Ultrasound Exam Limited retroperitoneal ultrasound of kidneys  Performed and interpreted by Dr. Reather Converse Indication: flank pain Focused abdominal ultrasound with both kidneys imaged in transverse and longitudinal planes in real-time. Interpretation: mild left hydronephrosis visualized.no right hydronephrosis  69mm  Stone in kidney Images archived electronically  Labs Review Labs Reviewed  BASIC METABOLIC PANEL  URINALYSIS, ROUTINE W REFLEX MICROSCOPIC    Imaging Review No results found.   EKG Interpretation None      MDM   Final diagnoses:  Nephrolithiasis  Flank pain   Recurrent kidney stone history with similar presentation. With multiple CT scan history discuss plan for bedside ultrasound to look for significant hydronephrosis. Plan to check kidney function, urine, pain meds, antiemetics and fluid bolus. Instructed patient to followup closely with his specialists.  Pt improved on recheck.  Results and differential diagnosis were discussed with the patient. Close follow up outpatient was discussed, patient comfortable with the plan.   Filed Vitals:   05/09/13 1212  BP: 148/104  Pulse:  71  Temp: 97.7 F (36.5 C)  TempSrc: Oral  Resp: 20  SpO2: 95%       Mariea Clonts, MD 05/09/13 1433

## 2013-05-09 NOTE — ED Notes (Signed)
Initial contact - pt resting on stretcher with eyes closed, self repositioning for comfort.  Pt denies needs/complaints at this time.  NAD.  Awaiting dispo.

## 2013-05-16 ENCOUNTER — Emergency Department (HOSPITAL_COMMUNITY)
Admission: EM | Admit: 2013-05-16 | Discharge: 2013-05-17 | Disposition: A | Discharge status: Home / Self Care | Payer: BC Managed Care – PPO | Attending: Emergency Medicine | Admitting: Emergency Medicine

## 2013-05-16 DIAGNOSIS — K219 Gastro-esophageal reflux disease without esophagitis: Secondary | ICD-10-CM | POA: Insufficient documentation

## 2013-05-16 DIAGNOSIS — N509 Disorder of male genital organs, unspecified: Secondary | ICD-10-CM | POA: Insufficient documentation

## 2013-05-16 DIAGNOSIS — F411 Generalized anxiety disorder: Secondary | ICD-10-CM | POA: Insufficient documentation

## 2013-05-16 DIAGNOSIS — R110 Nausea: Secondary | ICD-10-CM | POA: Insufficient documentation

## 2013-05-16 DIAGNOSIS — Z79899 Other long term (current) drug therapy: Secondary | ICD-10-CM | POA: Insufficient documentation

## 2013-05-16 DIAGNOSIS — R109 Unspecified abdominal pain: Secondary | ICD-10-CM | POA: Insufficient documentation

## 2013-05-16 DIAGNOSIS — G8929 Other chronic pain: Secondary | ICD-10-CM

## 2013-05-16 DIAGNOSIS — G4700 Insomnia, unspecified: Secondary | ICD-10-CM | POA: Insufficient documentation

## 2013-05-16 DIAGNOSIS — Z9089 Acquired absence of other organs: Secondary | ICD-10-CM | POA: Insufficient documentation

## 2013-05-16 DIAGNOSIS — Z87442 Personal history of urinary calculi: Secondary | ICD-10-CM | POA: Insufficient documentation

## 2013-05-16 DIAGNOSIS — G40909 Epilepsy, unspecified, not intractable, without status epilepticus: Secondary | ICD-10-CM | POA: Insufficient documentation

## 2013-05-17 ENCOUNTER — Encounter (HOSPITAL_COMMUNITY): Payer: Self-pay | Admitting: Emergency Medicine

## 2013-05-17 ENCOUNTER — Encounter: Payer: Self-pay | Admitting: Family Medicine

## 2013-05-17 ENCOUNTER — Ambulatory Visit (INDEPENDENT_AMBULATORY_CARE_PROVIDER_SITE_OTHER): Payer: BC Managed Care – PPO | Admitting: Family Medicine

## 2013-05-17 VITALS — BP 128/84 | HR 81 | Temp 97.6°F | Wt 294.0 lb

## 2013-05-17 DIAGNOSIS — N200 Calculus of kidney: Secondary | ICD-10-CM

## 2013-05-17 LAB — URINALYSIS, ROUTINE W REFLEX MICROSCOPIC
Bilirubin Urine: NEGATIVE
Glucose, UA: NEGATIVE mg/dL
Hgb urine dipstick: NEGATIVE
Ketones, ur: 40 mg/dL — AB
Leukocytes, UA: NEGATIVE
Nitrite: NEGATIVE
Protein, ur: NEGATIVE mg/dL
Specific Gravity, Urine: 1.018 (ref 1.005–1.030)
Urobilinogen, UA: 0.2 mg/dL (ref 0.0–1.0)
pH: 5 (ref 5.0–8.0)

## 2013-05-17 LAB — CBC
HCT: 46.4 % (ref 39.0–52.0)
Hemoglobin: 16.2 g/dL (ref 13.0–17.0)
MCH: 29.2 pg (ref 26.0–34.0)
MCHC: 34.9 g/dL (ref 30.0–36.0)
MCV: 83.8 fL (ref 78.0–100.0)
Platelets: 296 10*3/uL (ref 150–400)
RBC: 5.54 MIL/uL (ref 4.22–5.81)
RDW: 12.9 % (ref 11.5–15.5)
WBC: 8.4 10*3/uL (ref 4.0–10.5)

## 2013-05-17 LAB — LIPASE, BLOOD: Lipase: 52 U/L (ref 11–59)

## 2013-05-17 LAB — BASIC METABOLIC PANEL
BUN: 19 mg/dL (ref 6–23)
CO2: 23 mEq/L (ref 19–32)
Calcium: 10.1 mg/dL (ref 8.4–10.5)
Chloride: 97 mEq/L (ref 96–112)
Creatinine, Ser: 0.83 mg/dL (ref 0.50–1.35)
GFR calc Af Amer: >90 mL/min (ref >90)
GFR calc non Af Amer: >90 mL/min (ref >90)
Glucose, Bld: 89 mg/dL (ref 70–99)
Potassium: 4.4 mEq/L (ref 3.7–5.3)
Sodium: 137 mEq/L (ref 137–147)

## 2013-05-17 MED ORDER — HYDROCODONE-ACETAMINOPHEN 10-325 MG PO TABS: 1.5000 | ORAL_TABLET | Freq: Four times a day (QID) | ORAL | Status: DC | PRN

## 2013-05-17 MED ORDER — HYDROMORPHONE HCL PF 2 MG/ML IJ SOLN
2.0000 mg | Freq: Once | INTRAMUSCULAR | Status: AC
  Administered 2013-05-17: 2 mg via INTRAVENOUS
  Filled 2013-05-17: qty 1

## 2013-05-17 MED ORDER — HYDROMORPHONE HCL PF 1 MG/ML IJ SOLN
1.0000 mg | Freq: Once | INTRAMUSCULAR | Status: AC
  Administered 2013-05-17: 1 mg via INTRAVENOUS
  Filled 2013-05-17: qty 1

## 2013-05-17 MED ORDER — SODIUM CHLORIDE 0.9 % IV BOLUS (SEPSIS)
1000.0000 mL | Freq: Once | INTRAVENOUS | Status: AC
  Administered 2013-05-17: 1000 mL via INTRAVENOUS

## 2013-05-17 NOTE — ED Provider Notes (Signed)
Medical screening examination/treatment/procedure(s) were performed by non-physician practitioner and as supervising physician I was immediately available for consultation/collaboration.   EKG Interpretation None        Wynetta Fines, MD 05/17/13 [phone]

## 2013-05-17 NOTE — ED Notes (Signed)
Pt. Came in to ED with complaint of flank pain on both sides at 10/10 which started at 1130 last night , pt. Was last seen here in ED a week ago for the same complaint with dx of kidney stones. Also complaint of nausea.

## 2013-05-17 NOTE — Progress Notes (Signed)
Pre visit review using our clinic review tool, if applicable. No additional management support is needed unless otherwise documented below in the visit note.  ER notes reviewed.  Continue to have stones and sig pain flares. He can usually get by with baseline pain meds, unless he has an inc in pain from a stone.   He saw pain mgmt and has CT pending.  24 urine pending with uro.  He'll likely end up with nerve blocks or steroid injections per patient.    He's out of work today, this weekend.   Meds, vitals, and allergies reviewed.   ROS: See HPI.  Otherwise, noncontributory.  nad but appears tired Exam deferred given recent ER eval.

## 2013-05-17 NOTE — ED Provider Notes (Signed)
CSN: [id number]     Arrival date & time 05/16/13  2343 History   First MD Initiated Contact with Patient 05/17/13 0023     Chief Complaint  Patient presents with  . Flank Pain     (Consider location/radiation/quality/duration/timing/severity/associated sxs/prior Treatment) HPI History provided by pt and prior chart.  Per prior chart, pt has h/o nephrolithiasis as well as well as chronic groin pain, and is seen in ED frequently for both.  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  Urologist and pain management doctors with [facility].  Currently takes 40mg  hydrocodone per day for pain control.  Woke early this morning with worse than baseline, lightning like,  bilateral flank pain w/ radiation to penis.  Associated w/ nausea as well as R testicular pain, though this is stable.  Denies fever, abdominal pain, dysuria, hematuria, frequency, urethral discharge.  Denies trauma.   Past Medical History  Diagnosis Date  . GERD (gastroesophageal reflux disease)   . History of kidney stones   . Seizures Childhood  . Depression     and panic/anxiety  . Insomnia   . Kidney stones    Past Surgical History  Procedure Laterality Date  . Lithotripsy      four times over the years; most recent 10/08/12  . Appendectomy  2011  . Laparoscopic cholecystectomy  1996  . Tonsillectomy  1992  . Wrist ganglion excision  2002    Right  . Hydrocele excision Left 06/2012  . Orchiectomy Left 11/2012   Family History  Problem Relation Age of Onset  . Stroke Mother   . Liver disease Mother   . Irritable bowel syndrome Mother   . Kidney disease Mother   . Heart disease Father   . Colon polyps Father   . Colon cancer Neg Hx    History  Substance Use Topics  . Smoking status: Never Smoker   . Smokeless tobacco: Never Used  . Alcohol Use: Yes     Comment: rarely    Review of Systems  All other systems reviewed and are negative.     Allergies  Review of  patient's allergies indicates no known allergies.  Home Medications   Prior to Admission medications   Medication Sig Start Date End Date Taking? Authorizing Provider  docusate sodium (COLACE) 100 MG capsule Take 100 mg by mouth as needed for mild constipation.    Yes Historical Provider, MD  gabapentin (NEURONTIN) 300 MG capsule Take 300-900 mg by mouth at bedtime. 300 mg at bedtime for 5 days 600 mg at bedtime for 5 days. Then, 900 mg at bedtime .   Yes Historical Provider, MD  HYDROcodone-acetaminophen (NORCO) 10-325 MG per tablet Take 1.5 tablets by mouth every 6 (six) hours as needed for severe pain. Fill on/after 03/25/13. 04/19/13  Yes Tonia Ghent, MD  ibuprofen (ADVIL,MOTRIN) 200 MG tablet Take 400-600 mg by mouth every 6 (six) hours as needed for moderate pain.   Yes Historical Provider, MD  ondansetron (ZOFRAN-ODT) 4 MG disintegrating tablet Take 4 mg by mouth every 4 (four) hours as needed for nausea or vomiting.   Yes Historical Provider, MD  pantoprazole (PROTONIX) 40 MG tablet Take 40 mg by mouth 2 (two) times daily.   Yes Historical Provider, MD  zolpidem (AMBIEN) 10 MG tablet Take 1 tablet (10 mg total) by mouth at bedtime. 04/19/13  Yes Tonia Ghent, MD   BP 176/129  Pulse 97  Temp(Src) 97.6 F (36.4 C) (  Oral)  Resp 16  SpO2 100% Physical Exam  Nursing note and vitals reviewed. Constitutional: He is oriented to person, place, and time. He appears well-developed and well-nourished.  Uncomfortable appearing.    HENT:  Head: Normocephalic and atraumatic.  Eyes:  Normal appearance  Neck: Normal range of motion.  Cardiovascular: Normal rate and regular rhythm.   Pulmonary/Chest: Effort normal and breath sounds normal. No respiratory distress.  Abdominal: Soft. Bowel sounds are normal. He exhibits no distension and no mass. There is no rebound and no guarding.  Obese.  Mid-line abdominal tenderness  Genitourinary:  Tenderness to light palpation bilateral CVA.  R  testicle mildly tender.  Pt reports that this is typical.  No tenderness of epididymus.  No genitalia rash, urethral discharge or inguinal adenopathy.  Musculoskeletal: Normal range of motion.  Neurological: He is alert and oriented to person, place, and time.  Skin: Skin is warm and dry. No rash noted.  Psychiatric: He has a normal mood and affect. His behavior is normal.    ED Course  Procedures (including critical care time) Labs Review Labs Reviewed  URINALYSIS, ROUTINE W REFLEX MICROSCOPIC - Abnormal; Notable for the following:    APPearance CLOUDY (*)    Ketones, ur 40 (*)    All other components within normal limits  BASIC METABOLIC PANEL  CBC  LIPASE, BLOOD    Imaging Review No results found.   EKG Interpretation None      MDM   Final diagnoses:  Chronic flank pain   33yo M w/ chronic nephrolithiasis and groin pain for which he is seen in ED frequently, is followed by urology and pain management at [facility] and takes 40mg  hydrocodone per day, as well as h/o L orchiectomy d/t epididymitis, presents w/ stable groin pain, and severe, non-traumatic, bilateral flank pain that is typical of past kidney stones.  US scrotum unremarkable in 02/2013.  Most recent CT abd/pelvis also in 02/2013 (L proximal ureteral stone) and has been scanned several times.  Most recent visit 05/09/13 and bedside US showed hydronephrosis at that time.  On exam today, afebrile, uncomfortable but non-toxic appearing, abd soft/non-distended, tender at mid-line, bilateral CVA ttp, mild R testicular ttp but otherwise nml genitalia.  IVF and dilaudid ordered.  Will reassess shortly.  1:45 AM   Pain improved temporarily with dilaudid.  Pt reports no relief in past w/ toradol.  U/A neg for infection and hematuria.  Has had nml urine in past when ureteral stone confirmed by CT.  He has an appt w/ his PCP today and has vicodin for pain at home.  I advised f/u with urology as well.  VS w/in nml range and pt  well-appearing at time of discharge.  Return precautions discussed.     Remer Macho, PA-C 05/17/13 [phone]

## 2013-05-17 NOTE — Patient Instructions (Signed)
Take care.  I'll await the uro and pain clinic notes.  Glad to see you.

## 2013-05-17 NOTE — Discharge Instructions (Signed)
Continue your vicodin as needed for flank pain.  Follow up with your primary care doctor today as scheduled.   You may return to the ER if symptoms worsen or you have any other concerns.

## 2013-05-19 NOTE — Assessment & Plan Note (Signed)
>  15 minutes spent in face to face time with patient, >50% spent in counselling or coordination of care.  I have no indication that he is pain med seeking.  He does appear to have sig pain from mult sources (possibly contribution to groin pain and recurrent renal stones) that will occ require pain meds stronger than high dose of oral hydrocodone.  I'll await pain clinic eval and uro eval. He agrees. No change in pain meds today.

## 2013-05-22 ENCOUNTER — Encounter: Payer: Self-pay | Admitting: Family Medicine

## 2013-05-25 ENCOUNTER — Encounter (HOSPITAL_COMMUNITY): Payer: Self-pay | Admitting: Emergency Medicine

## 2013-05-25 ENCOUNTER — Emergency Department (HOSPITAL_COMMUNITY)
Admission: EM | Admit: 2013-05-25 | Discharge: 2013-05-25 | Disposition: A | Discharge status: Home / Self Care | Payer: BC Managed Care – PPO | Attending: Emergency Medicine | Admitting: Emergency Medicine

## 2013-05-25 DIAGNOSIS — K219 Gastro-esophageal reflux disease without esophagitis: Secondary | ICD-10-CM | POA: Insufficient documentation

## 2013-05-25 DIAGNOSIS — Z791 Long term (current) use of non-steroidal anti-inflammatories (NSAID): Secondary | ICD-10-CM | POA: Insufficient documentation

## 2013-05-25 DIAGNOSIS — Z87442 Personal history of urinary calculi: Secondary | ICD-10-CM | POA: Insufficient documentation

## 2013-05-25 DIAGNOSIS — Z9089 Acquired absence of other organs: Secondary | ICD-10-CM | POA: Insufficient documentation

## 2013-05-25 DIAGNOSIS — Z79899 Other long term (current) drug therapy: Secondary | ICD-10-CM | POA: Insufficient documentation

## 2013-05-25 DIAGNOSIS — F3289 Other specified depressive episodes: Secondary | ICD-10-CM | POA: Insufficient documentation

## 2013-05-25 DIAGNOSIS — R109 Unspecified abdominal pain: Secondary | ICD-10-CM | POA: Insufficient documentation

## 2013-05-25 DIAGNOSIS — N509 Disorder of male genital organs, unspecified: Secondary | ICD-10-CM | POA: Insufficient documentation

## 2013-05-25 DIAGNOSIS — G40909 Epilepsy, unspecified, not intractable, without status epilepticus: Secondary | ICD-10-CM | POA: Insufficient documentation

## 2013-05-25 DIAGNOSIS — F329 Major depressive disorder, single episode, unspecified: Secondary | ICD-10-CM | POA: Insufficient documentation

## 2013-05-25 DIAGNOSIS — R1030 Lower abdominal pain, unspecified: Secondary | ICD-10-CM

## 2013-05-25 DIAGNOSIS — Z9889 Other specified postprocedural states: Secondary | ICD-10-CM | POA: Insufficient documentation

## 2013-05-25 DIAGNOSIS — G4700 Insomnia, unspecified: Secondary | ICD-10-CM | POA: Insufficient documentation

## 2013-05-25 LAB — URINALYSIS, ROUTINE W REFLEX MICROSCOPIC
Bilirubin Urine: NEGATIVE
Glucose, UA: NEGATIVE mg/dL
Hgb urine dipstick: NEGATIVE
Ketones, ur: NEGATIVE mg/dL
Leukocytes, UA: NEGATIVE
Nitrite: NEGATIVE
Protein, ur: NEGATIVE mg/dL
Specific Gravity, Urine: 1.021 (ref 1.005–1.030)
Urobilinogen, UA: 0.2 mg/dL (ref 0.0–1.0)
pH: 6 (ref 5.0–8.0)

## 2013-05-25 MED ORDER — HYDROMORPHONE HCL PF 1 MG/ML IJ SOLN
1.0000 mg | Freq: Once | INTRAMUSCULAR | Status: AC
  Administered 2013-05-25: 1 mg via INTRAVENOUS
  Filled 2013-05-25: qty 1

## 2013-05-25 MED ORDER — ONDANSETRON HCL 4 MG/2ML IJ SOLN
4.0000 mg | Freq: Once | INTRAMUSCULAR | Status: AC
  Administered 2013-05-25: 4 mg via INTRAVENOUS
  Filled 2013-05-25: qty 2

## 2013-05-25 MED ORDER — SODIUM CHLORIDE 0.9 % IV BOLUS (SEPSIS)
1000.0000 mL | Freq: Once | INTRAVENOUS | Status: AC
  Administered 2013-05-25: 1000 mL via INTRAVENOUS

## 2013-05-25 MED ORDER — LORAZEPAM 2 MG/ML IJ SOLN
1.0000 mg | Freq: Once | INTRAMUSCULAR | Status: AC
  Administered 2013-05-25: 1 mg via INTRAVENOUS
  Filled 2013-05-25: qty 1

## 2013-05-25 NOTE — ED Notes (Signed)
Pt on med hold.

## 2013-05-25 NOTE — ED Notes (Signed)
Pt c/o bilateral flank pain w/ hx of kidney stones.  Pain since yesterday.  Also c/o rt testicle pain.  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  Thinks the same thing may be happening to the other one.  This pain has been going on x several months.

## 2013-05-25 NOTE — ED Notes (Addendum)
Pt reports bilateral flank pain since yesterday and right testicle pain for months. Pt took norco 10-325 mg and zofran ODT 4 mg one hour prior to arrival.   Pt changing into gown and attempting to void at present time.

## 2013-05-25 NOTE — Discharge Instructions (Signed)
Take your hydrocodone as prescribed.   See your urologist in 2 days.   Follow up with your primary care doctor and pain specialist.   Return to ER if you have severe pain, vomiting, blood in urine, fever.

## 2013-05-25 NOTE — ED Provider Notes (Signed)
CSN: [id number]     Arrival date & time 05/25/13  1829 History   First MD Initiated Contact with Patient 05/25/13 1858     Chief Complaint  Patient presents with  . Flank Pain  . Testicle Pain     (Consider location/radiation/quality/duration/timing/severity/associated sxs/prior Treatment) The history is provided by the patient.  Casey Caldwell is a 33 y.o. male GERD, seizure, chronic groin pain, left epididymitis s/p orchiectomy here with right testicular pain, flank pain. Patient has been having chronic groin pain and flank pain. Pain has been over several months and he had multiple ED visits for flank pain or groin pain. He came in 4 days ago and had normal labs and normal urine showed no hematuria. He was also given Dilaudid and felt better and went home. He is on 40 mg of hydrocodone and day and has been managed by a pain specialist. He is scheduled to see another urologist in 2 days. Denies fevers or chills.    Past Medical History  Diagnosis Date  . GERD (gastroesophageal reflux disease)   . History of kidney stones   . Seizures Childhood  . Depression     and panic/anxiety  . Insomnia   . Kidney stones    Past Surgical History  Procedure Laterality Date  . Lithotripsy      four times over the years; most recent 10/08/12  . Appendectomy  2011  . Laparoscopic cholecystectomy  1996  . Tonsillectomy  1992  . Wrist ganglion excision  2002    Right  . Hydrocele excision Left 06/2012  . Orchiectomy Left 11/2012   Family History  Problem Relation Age of Onset  . Stroke Mother   . Liver disease Mother   . Irritable bowel syndrome Mother   . Kidney disease Mother   . Heart disease Father   . Colon polyps Father   . Colon cancer Neg Hx    History  Substance Use Topics  . Smoking status: Never Smoker   . Smokeless tobacco: Never Used  . Alcohol Use: Yes     Comment: rarely    Review of Systems  Genitourinary: Positive for flank pain and testicular pain.  All other  systems reviewed and are negative.     Allergies  Review of patient's allergies indicates no known allergies.  Home Medications   Prior to Admission medications   Medication Sig Start Date End Date Taking? Authorizing Provider  docusate sodium (COLACE) 100 MG capsule Take 100 mg by mouth as needed for mild constipation.    Yes Historical Provider, MD  gabapentin (NEURONTIN) 300 MG capsule Take 300-900 mg by mouth at bedtime. 300 mg at bedtime for 5 days 600 mg at bedtime for 5 days. Then, 900 mg at bedtime .   Yes Historical Provider, MD  HYDROcodone-acetaminophen (NORCO) 10-325 MG per tablet Take 1.5 tablets by mouth every 6 (six) hours as needed for severe pain. Fill on/after 05/22/13. 05/17/13  Yes Tonia Ghent, MD  ibuprofen (ADVIL,MOTRIN) 200 MG tablet Take 400-600 mg by mouth every 6 (six) hours as needed for moderate pain.   Yes Historical Provider, MD  ondansetron (ZOFRAN-ODT) 4 MG disintegrating tablet Take 4 mg by mouth every 4 (four) hours as needed for nausea or vomiting.   Yes Historical Provider, MD  pantoprazole (PROTONIX) 40 MG tablet Take 40 mg by mouth 2 (two) times daily.   Yes Historical Provider, MD  zolpidem (AMBIEN) 10 MG tablet Take 1 tablet (10 mg total) by  mouth at bedtime. 04/19/13  Yes Tonia Ghent, MD   BP 170/108  Pulse 79  Temp(Src) 97.6 F (36.4 C) (Oral)  Resp 18  SpO2 100% Physical Exam  Nursing note and vitals reviewed. Constitutional: He is oriented to person, place, and time.  Uncomfortable, crying in pain   HENT:  Head: Normocephalic.  Mouth/Throat: Oropharynx is clear and moist.  Eyes: Conjunctivae are normal. Pupils are equal, round, and reactive to light.  Neck: Normal range of motion. Neck supple.  Cardiovascular: Normal rate, regular rhythm and normal heart sounds.   Pulmonary/Chest: Effort normal and breath sounds normal. No respiratory distress. He has no wheezes.  Abdominal: Soft. Bowel sounds are normal. He exhibits no  distension. There is no tenderness. There is no rebound.  Bilateral flank tenderness   Genitourinary:  No L testicle. R testicle slightly swollen, minimally tender throughout. Good lie.   Musculoskeletal: Normal range of motion. He exhibits no edema and no tenderness.  Neurological: He is alert and oriented to person, place, and time. No cranial nerve deficit. Coordination normal.  Skin: Skin is warm and dry.  Psychiatric: He has a normal mood and affect. His behavior is normal. Judgment and thought content normal.    ED Course  Procedures (including critical care time) Labs Review Labs Reviewed  URINALYSIS, ROUTINE W REFLEX MICROSCOPIC    Imaging Review No results found.   EKG Interpretation None      MDM   Final diagnoses:  None   Casey Caldwell is a 33 y.o. male here with worsening of chronic groin and flank pain. I think he has chronic pain. I doubt testicular torsion. Can be chronic epididymitis but he will see another urologist in 2 days and can get workup outpatient. UA nl and I doubt that he has kidney stones requiring surgery. He had multiple CTs and ultrasounds done that were unremarkable. I think he has chronic pain and will need urology f/u and pain medicine f/u.    Wandra Arthurs, MD 05/25/13 2101

## 2013-05-27 DIAGNOSIS — R82994 Hypercalciuria: Secondary | ICD-10-CM | POA: Insufficient documentation

## 2013-05-27 DIAGNOSIS — R82993 Hyperuricosuria: Secondary | ICD-10-CM | POA: Insufficient documentation

## 2013-06-03 ENCOUNTER — Encounter (HOSPITAL_COMMUNITY): Payer: Self-pay | Admitting: Emergency Medicine

## 2013-06-03 ENCOUNTER — Emergency Department (HOSPITAL_COMMUNITY)
Admission: EM | Admit: 2013-06-03 | Discharge: 2013-06-03 | Disposition: A | Discharge status: Home / Self Care | Payer: BC Managed Care – PPO | Attending: Emergency Medicine | Admitting: Emergency Medicine

## 2013-06-03 DIAGNOSIS — Z79899 Other long term (current) drug therapy: Secondary | ICD-10-CM | POA: Insufficient documentation

## 2013-06-03 DIAGNOSIS — G43909 Migraine, unspecified, not intractable, without status migrainosus: Secondary | ICD-10-CM | POA: Insufficient documentation

## 2013-06-03 DIAGNOSIS — K219 Gastro-esophageal reflux disease without esophagitis: Secondary | ICD-10-CM | POA: Insufficient documentation

## 2013-06-03 DIAGNOSIS — F329 Major depressive disorder, single episode, unspecified: Secondary | ICD-10-CM | POA: Insufficient documentation

## 2013-06-03 DIAGNOSIS — G8929 Other chronic pain: Secondary | ICD-10-CM | POA: Insufficient documentation

## 2013-06-03 DIAGNOSIS — N509 Disorder of male genital organs, unspecified: Secondary | ICD-10-CM | POA: Insufficient documentation

## 2013-06-03 DIAGNOSIS — F3289 Other specified depressive episodes: Secondary | ICD-10-CM | POA: Insufficient documentation

## 2013-06-03 DIAGNOSIS — R110 Nausea: Secondary | ICD-10-CM | POA: Insufficient documentation

## 2013-06-03 DIAGNOSIS — F410 Panic disorder [episodic paroxysmal anxiety] without agoraphobia: Secondary | ICD-10-CM | POA: Insufficient documentation

## 2013-06-03 DIAGNOSIS — N200 Calculus of kidney: Secondary | ICD-10-CM | POA: Insufficient documentation

## 2013-06-03 LAB — URINALYSIS, ROUTINE W REFLEX MICROSCOPIC
Bilirubin Urine: NEGATIVE
Glucose, UA: NEGATIVE mg/dL
Hgb urine dipstick: NEGATIVE
Ketones, ur: NEGATIVE mg/dL
Leukocytes, UA: NEGATIVE
Nitrite: NEGATIVE
Protein, ur: NEGATIVE mg/dL
Specific Gravity, Urine: 1.023 (ref 1.005–1.030)
Urobilinogen, UA: 0.2 mg/dL (ref 0.0–1.0)
pH: 6 (ref 5.0–8.0)

## 2013-06-03 LAB — BASIC METABOLIC PANEL
BUN: 14 mg/dL (ref 6–23)
CO2: 27 mEq/L (ref 19–32)
Calcium: 10.2 mg/dL (ref 8.4–10.5)
Chloride: 97 mEq/L (ref 96–112)
Creatinine, Ser: 0.93 mg/dL (ref 0.50–1.35)
GFR calc Af Amer: >90 mL/min (ref >90)
GFR calc non Af Amer: >90 mL/min (ref >90)
Glucose, Bld: 95 mg/dL (ref 70–99)
Potassium: 3.7 mEq/L (ref 3.7–5.3)
Sodium: 140 mEq/L (ref 137–147)

## 2013-06-03 LAB — CBC
HCT: 48.2 % (ref 39.0–52.0)
Hemoglobin: 17.3 g/dL — ABNORMAL HIGH (ref 13.0–17.0)
MCH: 30.4 pg (ref 26.0–34.0)
MCHC: 35.9 g/dL (ref 30.0–36.0)
MCV: 84.6 fL (ref 78.0–100.0)
Platelets: 310 10*3/uL (ref 150–400)
RBC: 5.7 MIL/uL (ref 4.22–5.81)
RDW: 12.9 % (ref 11.5–15.5)
WBC: 10 10*3/uL (ref 4.0–10.5)

## 2013-06-03 MED ORDER — ONDANSETRON HCL 4 MG/2ML IJ SOLN
4.0000 mg | Freq: Once | INTRAMUSCULAR | Status: AC
  Administered 2013-06-03: 4 mg via INTRAVENOUS
  Filled 2013-06-03: qty 2

## 2013-06-03 MED ORDER — HYDROMORPHONE HCL PF 2 MG/ML IJ SOLN
2.0000 mg | Freq: Once | INTRAMUSCULAR | Status: AC
  Administered 2013-06-03: 2 mg via INTRAVENOUS
  Filled 2013-06-03: qty 1

## 2013-06-03 MED ORDER — FENTANYL CITRATE 0.05 MG/ML IJ SOLN
50.0000 ug | Freq: Once | INTRAMUSCULAR | Status: AC
  Administered 2013-06-03: 50 ug via INTRAVENOUS
  Filled 2013-06-03: qty 2

## 2013-06-03 MED ORDER — SODIUM CHLORIDE 0.9 % IV SOLN
20.0000 mL | INTRAVENOUS | Status: DC
Start: 2013-06-03 — End: 2013-06-04
  Administered 2013-06-03: 20 mL via INTRAVENOUS

## 2013-06-03 NOTE — Discharge Instructions (Signed)
Chronic Back Pain  When back pain lasts longer than 3 months, it is called chronic back pain.People with chronic back pain often go through certain periods that are more intense (flare-ups).  CAUSES Chronic back pain can be caused by wear and tear (degeneration) on different structures in your back. These structures include:  The bones of your spine (vertebrae) and the joints surrounding your spinal cord and nerve roots (facets).  The strong, fibrous tissues that connect your vertebrae (ligaments). Degeneration of these structures may result in pressure on your nerves. This can lead to constant pain. HOME CARE INSTRUCTIONS  Avoid bending, heavy lifting, prolonged sitting, and activities which make the problem worse.  Take brief periods of rest throughout the day to reduce your pain. Lying down or standing usually is better than sitting while you are resting.  Take over-the-counter or prescription medicines only as directed by your caregiver. SEEK IMMEDIATE MEDICAL CARE IF:   You have weakness or numbness in one of your legs or feet.  You have trouble controlling your bladder or bowels.  You have nausea, vomiting, abdominal pain, shortness of breath, or fainting. Document Released: 02/18/2004 Document Revised: 04/04/2011 Document Reviewed: 12/25/2010 [facility] Patient Information 2014 [place], [place].  Chronic Pain Chronic pain can be defined as pain that is off and on and lasts for 3 6 months or longer. Many things cause chronic pain, which can make it difficult to make a diagnosis. There are many treatment options available for chronic pain. However, finding a treatment that works well for you may require trying various approaches until the right one is found. Many people benefit from a combination of two or more types of treatment to control their pain. SYMPTOMS  Chronic pain can occur anywhere in the body and can range from mild to very severe. Some types of chronic pain  include:  Headache.  Low back pain.  Cancer pain.  Arthritis pain.  Neurogenic pain. This is pain resulting from damage to nerves. People with chronic pain may also have other symptoms such as:  Depression.  Anger.  Insomnia.  Anxiety. DIAGNOSIS  Your health care provider will help diagnose your condition over time. In many cases, the initial focus will be on excluding possible conditions that could be causing the pain. Depending on your symptoms, your health care provider may order tests to diagnose your condition. Some of these tests may include:   Blood tests.   CT scan.   MRI.   X-rays.   Ultrasounds.   Nerve conduction studies.  You may need to see a specialist.  TREATMENT  Finding treatment that works well may take time. You may be referred to a pain specialist. He or she may prescribe medicine or therapies, such as:   Mindful meditation or yoga.  Shots (injections) of numbing or pain-relieving medicines into the spine or area of pain.  Local electrical stimulation.  Acupuncture.   Massage therapy.   Aroma, color, light, or sound therapy.   Biofeedback.   Working with a physical therapist to keep from getting stiff.   Regular, gentle exercise.   Cognitive or behavioral therapy.   Group support.  Sometimes, surgery may be recommended.  HOME CARE INSTRUCTIONS   Take all medicines as directed by your health care provider.   Lessen stress in your life by relaxing and doing things such as listening to calming music.   Exercise or be active as directed by your health care provider.   Eat a healthy diet and include  things such as vegetables, fruits, fish, and lean meats in your diet.   Keep all follow-up appointments with your health care provider.   Attend a support group with others suffering from chronic pain. SEEK MEDICAL CARE IF:   Your pain gets worse.   You develop a new pain that was not there before.   You  cannot tolerate medicines given to you by your health care provider.   You have new symptoms since your last visit with your health care provider.  SEEK IMMEDIATE MEDICAL CARE IF:   You feel weak.   You have decreased sensation or numbness.   You lose control of bowel or bladder function.   Your pain suddenly gets much worse.   You develop shaking.  You develop chills.  You develop confusion.  You develop chest pain.  You develop shortness of breath.  MAKE SURE YOU:  Understand these instructions.  Will watch your condition.  Will get help right away if you are not doing well or get worse. Document Released: 10/02/2001 Document Revised: 09/12/2012 Document Reviewed: 07/06/2012 [facility] Patient Information 2014 [place].  Kidney Stones Kidney stones (urolithiasis) are deposits that form inside your kidneys. The intense pain is caused by the stone moving through the urinary tract. When the stone moves, the ureter goes into spasm around the stone. The stone is usually passed in the urine.  CAUSES   A disorder that makes certain neck glands produce too much parathyroid hormone (primary hyperparathyroidism).  A buildup of uric acid crystals, similar to gout in your joints.  Narrowing (stricture) of the ureter.  A kidney obstruction present at birth (congenital obstruction).  Previous surgery on the kidney or ureters.  Numerous kidney infections. SYMPTOMS   Feeling sick to your stomach (nauseous).  Throwing up (vomiting).  Blood in the urine (hematuria).  Pain that usually spreads (radiates) to the groin.  Frequency or urgency of urination. DIAGNOSIS   Taking a history and physical exam.  Blood or urine tests.  CT scan.  Occasionally, an examination of the inside of the urinary bladder (cystoscopy) is performed. TREATMENT   Observation.  Increasing your fluid intake.  Extracorporeal shock wave lithotripsy This is a noninvasive procedure  that uses shock waves to break up kidney stones.  Surgery may be needed if you have severe pain or persistent obstruction. There are various surgical procedures. Most of the procedures are performed with the use of small instruments. Only small incisions are needed to accommodate these instruments, so recovery time is minimized. The size, location, and chemical composition are all important variables that will determine the proper choice of action for you. Talk to your health care provider to better understand your situation so that you will minimize the risk of injury to yourself and your kidney.  HOME CARE INSTRUCTIONS   Drink enough water and fluids to keep your urine clear or pale yellow. This will help you to pass the stone or stone fragments.  Strain all urine through the provided strainer. Keep all particulate matter and stones for your health care provider to see. The stone causing the pain may be as small as a grain of salt. It is very important to use the strainer each and every time you pass your urine. The collection of your stone will allow your health care provider to analyze it and verify that a stone has actually passed. The stone analysis will often identify what you can do to reduce the incidence of recurrences.  Only take  over-the-counter or prescription medicines for pain, discomfort, or fever as directed by your health care provider.  Make a follow-up appointment with your health care provider as directed.  Get follow-up X-rays if required. The absence of pain does not always mean that the stone has passed. It may have only stopped moving. If the urine remains completely obstructed, it can cause loss of kidney function or even complete destruction of the kidney. It is your responsibility to make sure X-rays and follow-ups are completed. Ultrasounds of the kidney can show blockages and the status of the kidney. Ultrasounds are not associated with any radiation and can be performed  easily in a matter of minutes. SEEK MEDICAL CARE IF:  You experience pain that is progressive and unresponsive to any pain medicine you have been prescribed. SEEK IMMEDIATE MEDICAL CARE IF:   Pain cannot be controlled with the prescribed medicine.  You have a fever or shaking chills.  The severity or intensity of pain increases over 18 hours and is not relieved by pain medicine.  You develop a new onset of abdominal pain.  You feel faint or pass out.  You are unable to urinate. MAKE SURE YOU:   Understand these instructions.  Will watch your condition.  Will get help right away if you are not doing well or get worse. Document Released: 01/10/2005 Document Revised: 09/12/2012 Document Reviewed: 06/13/2012 [facility] Patient Information 2014 [place].

## 2013-06-03 NOTE — ED Notes (Signed)
Bed: WY61 Expected date:  Expected time:  Means of arrival:  Comments: From CT-acute appy

## 2013-06-03 NOTE — ED Notes (Signed)
Pt complains of right flank pain and bilateral groin pain since yesterday, hx of kidney stones and the pain is the same

## 2013-06-03 NOTE — ED Provider Notes (Signed)
CSN: [id number]     Arrival date & time 06/03/13  1907 History   First MD Initiated Contact with Patient 06/03/13 2124     Chief Complaint  Patient presents with  . Flank Pain     (Consider location/radiation/quality/duration/timing/severity/associated sxs/prior Treatment) HPI Comments: Patient is a 33 year old male with a PMHx significant for chronic lower back pain, right testicular pain and chronic flank pain who is followed at [facility] by Dr. Rosana Hoes with urology and pain management there.  He states that he has known kidney stones.  He was having a lumbar spine MRI this evening at [facility] and on the way home he reports an acute exacerbation of his chronic pain so he came here for further management.  He denies fever, chills, reports nausea without vomiting, no change in his chronic lower back pain.  No fever, chills, gross hematuria, has been taking his hydrocodone as directed.  Patient is a 33 y.o. male presenting with flank pain. The history is provided by the patient. No language interpreter was used.  Flank Pain This is a chronic problem. The current episode started today. The problem occurs constantly. The problem has been unchanged. Associated symptoms include nausea. Pertinent negatives include no abdominal pain, anorexia, chest pain, congestion, coughing, fever, headaches, joint swelling, neck pain, numbness, urinary symptoms, visual change, vomiting or weakness. Nothing aggravates the symptoms. He has tried nothing for the symptoms. The treatment provided no relief.    Past Medical History  Diagnosis Date  . GERD (gastroesophageal reflux disease)   . History of kidney stones   . Seizures Childhood  . Depression     and panic/anxiety  . Insomnia   . Kidney stones    Past Surgical History  Procedure Laterality Date  . Lithotripsy      four times over the years; most recent 10/08/12  . Appendectomy  2011  . Laparoscopic cholecystectomy  1996  . Tonsillectomy  1992  .  Wrist ganglion excision  2002    Right  . Hydrocele excision Left 06/2012  . Orchiectomy Left 11/2012   Family History  Problem Relation Age of Onset  . Stroke Mother   . Liver disease Mother   . Irritable bowel syndrome Mother   . Kidney disease Mother   . Heart disease Father   . Colon polyps Father   . Colon cancer Neg Hx    History  Substance Use Topics  . Smoking status: Never Smoker   . Smokeless tobacco: Never Used  . Alcohol Use: Yes     Comment: rarely    Review of Systems  Constitutional: Negative for fever.  HENT: Negative for congestion.   Respiratory: Negative for cough.   Cardiovascular: Negative for chest pain.  Gastrointestinal: Positive for nausea. Negative for vomiting, abdominal pain and anorexia.  Genitourinary: Positive for flank pain.  Musculoskeletal: Negative for joint swelling and neck pain.  Neurological: Negative for weakness, numbness and headaches.  All other systems reviewed and are negative.     Allergies  Review of patient's allergies indicates no known allergies.  Home Medications   Prior to Admission medications   Medication Sig Start Date End Date Taking? Authorizing Provider  docusate sodium (COLACE) 100 MG capsule Take 100 mg by mouth 2 (two) times daily.    Yes Historical Provider, MD  gabapentin (NEURONTIN) 300 MG capsule Take 300-900 mg by mouth at bedtime. 300 mg at bedtime for 5 days 600 mg at bedtime for 5 days. Then, 900 mg  at bedtime .   Yes Historical Provider, MD  HYDROcodone-acetaminophen (NORCO) 10-325 MG per tablet Take 1.5 tablets by mouth every 6 (six) hours as needed for severe pain. Fill on/after 05/22/13. 05/17/13  Yes Tonia Ghent, MD  ibuprofen (ADVIL,MOTRIN) 200 MG tablet Take 400-600 mg by mouth every 6 (six) hours as needed for moderate pain.   Yes Historical Provider, MD  indapamide (LOZOL) 2.5 MG tablet Take 2.5 mg by mouth daily.   Yes Historical Provider, MD  ondansetron (ZOFRAN-ODT) 4 MG disintegrating  tablet Take 4 mg by mouth every 4 (four) hours as needed for nausea or vomiting.   Yes Historical Provider, MD  pantoprazole (PROTONIX) 40 MG tablet Take 40 mg by mouth 2 (two) times daily.   Yes Historical Provider, MD  potassium citrate (UROCIT-K) 10 MEQ (1080 MG) SR tablet Take 10 mEq by mouth 2 (two) times daily.   Yes Historical Provider, MD  zolpidem (AMBIEN) 10 MG tablet Take 1 tablet (10 mg total) by mouth at bedtime. 04/19/13  Yes Tonia Ghent, MD   BP 124/72  Pulse 87  Temp(Src) 97.6 F (36.4 C) (Oral)  Resp 16  SpO2 95% Physical Exam  Nursing note and vitals reviewed. Constitutional: He is oriented to person, place, and time. He appears well-developed and well-nourished. No distress.  HENT:  Head: Normocephalic and atraumatic.  Right Ear: External ear normal.  Left Ear: External ear normal.  Nose: Nose normal.  Mouth/Throat: Oropharynx is clear and moist. No oropharyngeal exudate.  Eyes: Conjunctivae are normal. Pupils are equal, round, and reactive to light. No scleral icterus.  Neck: Normal range of motion. Neck supple.  Cardiovascular: Normal rate, regular rhythm and normal heart sounds.  Exam reveals no gallop and no friction rub.   No murmur heard. Pulmonary/Chest: Effort normal and breath sounds normal. No respiratory distress. He has no wheezes. He has no rales. He exhibits no tenderness.  Abdominal: Soft. Bowel sounds are normal. He exhibits no distension. There is no tenderness. There is no rebound and no guarding. Hernia confirmed negative in the right inguinal area and confirmed negative in the left inguinal area.  Genitourinary: Cremasteric reflex is present. Right testis shows tenderness. Right testis shows no mass and no swelling. Cremasteric reflex is not absent on the right side. Circumcised. No penile tenderness.  Left testicle surgically absent, healed incision to left inguinal area without drainage, erythema.  Musculoskeletal:       Lumbar back: He  exhibits tenderness, bony tenderness and spasm. He exhibits normal range of motion.       Back:  Lymphadenopathy:    He has no cervical adenopathy.  Neurological: He is alert and oriented to person, place, and time. He exhibits normal muscle tone. Coordination normal.  Skin: Skin is warm and dry. No rash noted. No erythema. No pallor.  Psychiatric: He has a normal mood and affect. His behavior is normal. Judgment and thought content normal.    ED Course  Procedures (including critical care time) Labs Review Labs Reviewed  CBC - Abnormal; Notable for the following:    Hemoglobin 17.3 (*)    All other components within normal limits  URINALYSIS, ROUTINE W REFLEX MICROSCOPIC  BASIC METABOLIC PANEL    Imaging Review No results found.   EKG Interpretation None      Results for orders placed during the hospital encounter of 06/03/13  URINALYSIS, ROUTINE W REFLEX MICROSCOPIC      Result Value Ref Range   Color, Urine YELLOW  YELLOW   APPearance CLEAR  CLEAR   Specific Gravity, Urine 1.023  1.005 - 1.030   pH 6.0  5.0 - 8.0   Glucose, UA NEGATIVE  NEGATIVE mg/dL   Hgb urine dipstick NEGATIVE  NEGATIVE   Bilirubin Urine NEGATIVE  NEGATIVE   Ketones, ur NEGATIVE  NEGATIVE mg/dL   Protein, ur NEGATIVE  NEGATIVE mg/dL   Urobilinogen, UA 0.2  0.0 - 1.0 mg/dL   Nitrite NEGATIVE  NEGATIVE   Leukocytes, UA NEGATIVE  NEGATIVE  BASIC METABOLIC PANEL      Result Value Ref Range   Sodium 140  137 - 147 mEq/L   Potassium 3.7  3.7 - 5.3 mEq/L   Chloride 97  96 - 112 mEq/L   CO2 27  19 - 32 mEq/L   Glucose, Bld 95  70 - 99 mg/dL   BUN 14  6 - 23 mg/dL   Creatinine, Ser 0.93  0.50 - 1.35 mg/dL   Calcium 10.2  8.4 - 10.5 mg/dL   GFR calc non Af Amer >90  >90 mL/min   GFR calc Af Amer >90  >90 mL/min  CBC      Result Value Ref Range   WBC 10.0  4.0 - 10.5 K/uL   RBC 5.70  4.22 - 5.81 MIL/uL   Hemoglobin 17.3 (*) 13.0 - 17.0 g/dL   HCT 48.2  39.0 - 52.0 %   MCV 84.6  78.0 - 100.0 fL    MCH 30.4  26.0 - 34.0 pg   MCHC 35.9  30.0 - 36.0 g/dL   RDW 12.9  11.5 - 15.5 %   Platelets 310  150 - 400 K/uL   No results found.    MDM  Chronic flank pain Chronic lower back pain Chronic testicular pain  Patient here with acute exacerbation of chronic pain, no blood in urine and I have discussed with the patient the fact that I doubt CT scan would show anything.  He has appointment with chronic pain MD in a week, had MRI today, there are no alarming signs to suggest cauda equina or epidural hematoma.  Given IV pain medication here with improvement in pain.    Idalia Needle Joelyn Oms, PA-C 06/03/13 2313

## 2013-06-05 NOTE — ED Provider Notes (Signed)
Medical screening examination/treatment/procedure(s) were performed by non-physician practitioner and as supervising physician I was immediately available for consultation/collaboration.   EKG Interpretation None       Babette Relic, MD 06/05/13 2124

## 2013-06-13 ENCOUNTER — Encounter: Payer: Self-pay | Admitting: Family Medicine

## 2013-06-13 ENCOUNTER — Ambulatory Visit (INDEPENDENT_AMBULATORY_CARE_PROVIDER_SITE_OTHER): Payer: BC Managed Care – PPO | Admitting: Family Medicine

## 2013-06-13 VITALS — BP 150/100 | HR 63 | Temp 97.7°F | Wt 290.8 lb

## 2013-06-13 DIAGNOSIS — N200 Calculus of kidney: Secondary | ICD-10-CM

## 2013-06-13 MED ORDER — HYDROCODONE-ACETAMINOPHEN 10-325 MG PO TABS: 1.5000 | ORAL_TABLET | Freq: Four times a day (QID) | ORAL | Status: DC | PRN

## 2013-06-13 NOTE — Progress Notes (Signed)
Pre visit review using our clinic review tool, if applicable. No additional management support is needed unless otherwise documented below in the visit note.  3 teeth pulled yesterday.  He had to have sedation due to a lack of pain control locally.    He had a facet joint injection done about 2 days ago per pain clinic.  He is likely still trying to pass a stone.  His pain continues to episodically flare.    He has no new sx at this point, other than the expected jaw sx after the extraction.  Compliant with pain meds w/o ADE.   Meds, vitals, and allergies reviewed.   ROS: See HPI.  Otherwise, noncontributory.  nad but uncomfortable and tired appearing ncat OP with healing extraction sites on the R upper and lower side noted rrr ctab abd soft Ext w/o edema

## 2013-06-13 NOTE — Patient Instructions (Signed)
Don't change your meds and I'll check with [facility] in the meantime.   Take care.

## 2013-06-13 NOTE — Assessment & Plan Note (Signed)
Still with stones to pass. Now on pot cit and lozol.  Continue his current hydrocodone rx for pain.  I'll check with [place] clinic in the meantime now that he has had his injection.

## 2013-06-15 ENCOUNTER — Emergency Department (HOSPITAL_COMMUNITY): Payer: BC Managed Care – PPO

## 2013-06-15 ENCOUNTER — Encounter (HOSPITAL_COMMUNITY): Payer: Self-pay | Admitting: Emergency Medicine

## 2013-06-15 ENCOUNTER — Emergency Department (HOSPITAL_COMMUNITY)
Admission: EM | Admit: 2013-06-15 | Discharge: 2013-06-15 | Disposition: A | Discharge status: Home / Self Care | Payer: BC Managed Care – PPO | Attending: Emergency Medicine | Admitting: Emergency Medicine

## 2013-06-15 DIAGNOSIS — M545 Low back pain, unspecified: Secondary | ICD-10-CM | POA: Insufficient documentation

## 2013-06-15 DIAGNOSIS — F3289 Other specified depressive episodes: Secondary | ICD-10-CM | POA: Insufficient documentation

## 2013-06-15 DIAGNOSIS — Z87442 Personal history of urinary calculi: Secondary | ICD-10-CM | POA: Insufficient documentation

## 2013-06-15 DIAGNOSIS — F329 Major depressive disorder, single episode, unspecified: Secondary | ICD-10-CM | POA: Insufficient documentation

## 2013-06-15 DIAGNOSIS — F410 Panic disorder [episodic paroxysmal anxiety] without agoraphobia: Secondary | ICD-10-CM | POA: Insufficient documentation

## 2013-06-15 DIAGNOSIS — Z79899 Other long term (current) drug therapy: Secondary | ICD-10-CM | POA: Insufficient documentation

## 2013-06-15 DIAGNOSIS — M549 Dorsalgia, unspecified: Secondary | ICD-10-CM

## 2013-06-15 DIAGNOSIS — K219 Gastro-esophageal reflux disease without esophagitis: Secondary | ICD-10-CM | POA: Insufficient documentation

## 2013-06-15 DIAGNOSIS — G40909 Epilepsy, unspecified, not intractable, without status epilepticus: Secondary | ICD-10-CM | POA: Insufficient documentation

## 2013-06-15 LAB — COMPREHENSIVE METABOLIC PANEL
ALT: 52 U/L (ref 0–53)
AST: 26 U/L (ref 0–37)
Albumin: 4.8 g/dL (ref 3.5–5.2)
Alkaline Phosphatase: 81 U/L (ref 39–117)
BUN: 21 mg/dL (ref 6–23)
CO2: 23 mEq/L (ref 19–32)
Calcium: 10.3 mg/dL (ref 8.4–10.5)
Chloride: 97 mEq/L (ref 96–112)
Creatinine, Ser: 1.09 mg/dL (ref 0.50–1.35)
GFR calc Af Amer: >90 mL/min (ref >90)
GFR calc non Af Amer: 86 mL/min — ABNORMAL LOW (ref >90)
Glucose, Bld: 119 mg/dL — ABNORMAL HIGH (ref 70–99)
Potassium: 4.5 mEq/L (ref 3.7–5.3)
Sodium: 141 mEq/L (ref 137–147)
Total Bilirubin: 0.5 mg/dL (ref 0.3–1.2)
Total Protein: 8.3 g/dL (ref 6.0–8.3)

## 2013-06-15 LAB — URINALYSIS, ROUTINE W REFLEX MICROSCOPIC
Bilirubin Urine: NEGATIVE
Glucose, UA: NEGATIVE mg/dL
Hgb urine dipstick: NEGATIVE
Ketones, ur: NEGATIVE mg/dL
Leukocytes, UA: NEGATIVE
Nitrite: NEGATIVE
Protein, ur: NEGATIVE mg/dL
Specific Gravity, Urine: 1.024 (ref 1.005–1.030)
Urobilinogen, UA: 0.2 mg/dL (ref 0.0–1.0)
pH: 6 (ref 5.0–8.0)

## 2013-06-15 LAB — CBC WITH DIFFERENTIAL/PLATELET
Basophils Absolute: 0 10*3/uL (ref 0.0–0.1)
Basophils Relative: 0 % (ref 0–1)
Eosinophils Absolute: 0 10*3/uL (ref 0.0–0.7)
Eosinophils Relative: 0 % (ref 0–5)
HCT: 50.9 % (ref 39.0–52.0)
Hemoglobin: 18.9 g/dL — ABNORMAL HIGH (ref 13.0–17.0)
Lymphocytes Relative: 19 % (ref 12–46)
Lymphs Abs: 3.4 10*3/uL (ref 0.7–4.0)
MCH: 30.7 pg (ref 26.0–34.0)
MCHC: 37.1 g/dL — ABNORMAL HIGH (ref 30.0–36.0)
MCV: 82.6 fL (ref 78.0–100.0)
Monocytes Absolute: 2.2 10*3/uL — ABNORMAL HIGH (ref 0.1–1.0)
Monocytes Relative: 12 % (ref 3–12)
Neutro Abs: 11.9 10*3/uL — ABNORMAL HIGH (ref 1.7–7.7)
Neutrophils Relative %: 68 % (ref 43–77)
Platelets: 402 10*3/uL — ABNORMAL HIGH (ref 150–400)
RBC: 6.16 MIL/uL — ABNORMAL HIGH (ref 4.22–5.81)
RDW: 12.7 % (ref 11.5–15.5)
WBC: 17.5 10*3/uL — ABNORMAL HIGH (ref 4.0–10.5)

## 2013-06-15 MED ORDER — HYDROMORPHONE HCL PF 1 MG/ML IJ SOLN
1.0000 mg | Freq: Once | INTRAMUSCULAR | Status: AC
  Administered 2013-06-15: 1 mg via INTRAVENOUS
  Filled 2013-06-15: qty 1

## 2013-06-15 MED ORDER — ONDANSETRON HCL 4 MG/2ML IJ SOLN
4.0000 mg | Freq: Once | INTRAMUSCULAR | Status: AC
  Administered 2013-06-15: 4 mg via INTRAVENOUS
  Filled 2013-06-15: qty 2

## 2013-06-15 MED ORDER — SODIUM CHLORIDE 0.9 % IV BOLUS (SEPSIS)
1000.0000 mL | Freq: Once | INTRAVENOUS | Status: AC
  Administered 2013-06-15: 1000 mL via INTRAVENOUS

## 2013-06-15 MED ORDER — GADOBENATE DIMEGLUMINE 529 MG/ML IV SOLN
20.0000 mL | Freq: Once | INTRAVENOUS | Status: AC | PRN
  Administered 2013-06-15: 20 mL via INTRAVENOUS

## 2013-06-15 MED ORDER — HYDROMORPHONE HCL 4 MG PO TABS: 4.0000 mg | ORAL_TABLET | Freq: Four times a day (QID) | ORAL | Status: DC | PRN

## 2013-06-15 NOTE — Discharge Instructions (Signed)
Follow up Wednesday as planned

## 2013-06-15 NOTE — ED Notes (Signed)
Pt c/o increasing pain, EDP made aware.

## 2013-06-15 NOTE — ED Notes (Signed)
US at bedside

## 2013-06-15 NOTE — ED Provider Notes (Signed)
CSN: [id number]     Arrival date & time 06/15/13  1629 History   First MD Initiated Contact with Patient 06/15/13 1633     Chief Complaint  Patient presents with  . Flank Pain     (Consider location/radiation/quality/duration/timing/severity/associated sxs/prior Treatment) Patient is a 33 y.o. male presenting with back pain. The history is provided by the patient (the pt has a hx of chronic back pain.  His pain has become worse today.  he had a steroid shot in his back recently).  Back Pain Location:  Lumbar spine Quality:  Aching Radiates to: radiating to right groin. Pain severity:  Severe Pain is:  Same all the time Onset quality:  Gradual Timing:  Constant Progression:  Worsening Chronicity:  Chronic Context: not emotional stress   Associated symptoms: no abdominal pain, no chest pain and no headaches     Past Medical History  Diagnosis Date  . GERD (gastroesophageal reflux disease)   . History of kidney stones   . Seizures Childhood  . Depression     and panic/anxiety  . Insomnia   . Kidney stones    Past Surgical History  Procedure Laterality Date  . Lithotripsy      four times over the years; most recent 10/08/12  . Appendectomy  2011  . Laparoscopic cholecystectomy  1996  . Tonsillectomy  1992  . Wrist ganglion excision  2002    Right  . Hydrocele excision Left 06/2012  . Orchiectomy Left 11/2012   Family History  Problem Relation Age of Onset  . Stroke Mother   . Liver disease Mother   . Irritable bowel syndrome Mother   . Kidney disease Mother   . Heart disease Father   . Colon polyps Father   . Colon cancer Neg Hx    History  Substance Use Topics  . Smoking status: Never Smoker   . Smokeless tobacco: Never Used  . Alcohol Use: Yes     Comment: rarely    Review of Systems  Constitutional: Negative for appetite change and fatigue.  HENT: Negative for congestion, ear discharge and sinus pressure.   Eyes: Negative for discharge.  Respiratory:  Negative for cough.   Cardiovascular: Negative for chest pain.  Gastrointestinal: Negative for abdominal pain and diarrhea.  Genitourinary: Negative for frequency and hematuria.  Musculoskeletal: Positive for back pain.  Skin: Negative for rash.  Neurological: Negative for seizures and headaches.  Psychiatric/Behavioral: Negative for hallucinations.      Allergies  Review of patient's allergies indicates no known allergies.  Home Medications   Prior to Admission medications   Medication Sig Start Date End Date Taking? Authorizing Provider  docusate sodium (COLACE) 100 MG capsule Take 100 mg by mouth 2 (two) times daily.    Yes Historical Provider, MD  gabapentin (NEURONTIN) 300 MG capsule Take 1,200 mg by mouth daily.    Yes Historical Provider, MD  HYDROcodone-acetaminophen (NORCO) 10-325 MG per tablet Take 1.5 tablets by mouth every 6 (six) hours as needed for severe pain. Fill on/after 06/13/13. 06/13/13  Yes Tonia Ghent, MD  ibuprofen (ADVIL,MOTRIN) 200 MG tablet Take 400-600 mg by mouth every 6 (six) hours as needed for moderate pain.   Yes Historical Provider, MD  indapamide (LOZOL) 2.5 MG tablet Take 2.5 mg by mouth daily.   Yes Historical Provider, MD  ondansetron (ZOFRAN-ODT) 4 MG disintegrating tablet Take 4 mg by mouth every 4 (four) hours as needed for nausea or vomiting.   Yes Historical Provider, MD  pantoprazole (PROTONIX) 40 MG tablet Take 40 mg by mouth 2 (two) times daily.   Yes Historical Provider, MD  potassium citrate (UROCIT-K) 10 MEQ (1080 MG) SR tablet Take 10 mEq by mouth 2 (two) times daily.   Yes Historical Provider, MD  zolpidem (AMBIEN) 10 MG tablet Take 1 tablet (10 mg total) by mouth at bedtime. 04/19/13  Yes Tonia Ghent, MD  HYDROmorphone (DILAUDID) 4 MG tablet Take 1 tablet (4 mg total) by mouth every 6 (six) hours as needed for severe pain. 06/15/13   Maudry Diego, MD   BP 133/87  Pulse 71  Temp(Src) 98 F (36.7 C) (Oral)  Resp 19  SpO2  99% Physical Exam  Constitutional: He is oriented to person, place, and time. He appears well-developed.  HENT:  Head: Normocephalic.  Eyes: Conjunctivae and EOM are normal. No scleral icterus.  Neck: Neck supple. No thyromegaly present.  Cardiovascular: Normal rate and regular rhythm.  Exam reveals no gallop and no friction rub.   No murmur heard. Pulmonary/Chest: No stridor. He has no wheezes. He has no rales. He exhibits no tenderness.  Abdominal: He exhibits no distension. There is no tenderness. There is no rebound.  Musculoskeletal: Normal range of motion. He exhibits no edema.  Tender lumbar spine.    Lymphadenopathy:    He has no cervical adenopathy.  Neurological: He is oriented to person, place, and time. He displays normal reflexes. He exhibits normal muscle tone. Coordination normal.  Good strength in extremities   Skin: No rash noted. No erythema.  Psychiatric: He has a normal mood and affect. His behavior is normal.    ED Course  Procedures (including critical care time) Labs Review Labs Reviewed  CBC WITH DIFFERENTIAL - Abnormal; Notable for the following:    WBC 17.5 (*)    RBC 6.16 (*)    Hemoglobin 18.9 (*)    MCHC 37.1 (*)    Platelets 402 (*)    Neutro Abs 11.9 (*)    Monocytes Absolute 2.2 (*)    All other components within normal limits  COMPREHENSIVE METABOLIC PANEL - Abnormal; Notable for the following:    Glucose, Bld 119 (*)    GFR calc non Af Amer 86 (*)    All other components within normal limits  URINALYSIS, ROUTINE W REFLEX MICROSCOPIC    Imaging Review Mr Lumbar Spine W Wo Contrast  06/15/2013   CLINICAL DATA:  Back pain. Possible epidural abscess. Recent injection 5 days ago.  EXAM: MRI LUMBAR SPINE WITHOUT AND WITH CONTRAST  TECHNIQUE: Multiplanar and multiecho pulse sequences of the lumbar spine were obtained without and with intravenous contrast.  CONTRAST:  70mL MULTIHANCE GADOBENATE DIMEGLUMINE 529 MG/ML IV SOLN  COMPARISON:  None.   FINDINGS: Anatomic alignment. There is slight disc desiccation at L2-3, L3-4, and L4-5. The conus medullaris is normal. There are no worrisome osseous lesions. There is no abnormal postcontrast enhancement. Paravertebral soft tissues are unremarkable.  At L1-2, the disc space is normal.  At L2-3, there is a shallow central protrusion without impingement.  At L3-4, there is no significant disc protrusion or compressive lesion.  At L4-5, there is no significant central disc protrusion or compressive lesion. There may be a chronic extraforaminal bulge or protrusion on the left. There is slight leftward spurring but no significant foraminal narrowing. Mild facet arthropathy is noted.  At L5-S1, the disc bulges mildly. There is a small annular fissure in the midline. There is no impingement.  IMPRESSION: Shallow  central protrusion L2-3 without impingement. Otherwise no significant disk pathology.  No abnormal enhancement is evident. Specifically no evidence for epidural abscess.   Electronically Signed   By: Rolla Flatten M.D.   On: 06/15/2013 21:32   US Renal  06/15/2013   CLINICAL DATA:  32 year old male with flank pain. History of urinary calculi.  EXAM: RENAL/URINARY TRACT ULTRASOUND COMPLETE  COMPARISON:  04/14/2013 renal ultrasound and 03/19/2013 CT.  FINDINGS: Right Kidney:  Length: 12.7 cm. Echogenicity within normal limits. No mass or hydronephrosis visualized.  Left Kidney:  Length: 13.3 cm. A 5 mm nonobstructing calculus within the mid-lower left kidney is noted. Other punctate calculi identified prior CT are not well visualized. There is no evidence of hydronephrosis or solid mass.  Bladder:  Appears normal for degree of bladder distention.  IMPRESSION: No evidence of acute abnormality.  No evidence of hydronephrosis.  5 mm nonobstructing left renal calculus.   Electronically Signed   By: Hassan Rowan M.D.   On: 06/15/2013 17:36     EKG Interpretation None      MDM   Final diagnoses:  Back pain     Mri,  Unchanged.  Elevated wbc from steroid injection    Maudry Diego, MD 06/15/13 2145

## 2013-06-15 NOTE — ED Notes (Signed)
Patient transported to MRI 

## 2013-06-15 NOTE — ED Notes (Signed)
Pt A+Ox4, reports hx kidney stones, followed by urology.  Pt reports was here last week for same and has had pain since.  Worse today.  10/10 to bilat flanks.  Pt reports +nausea, denies vomiting, fevers or dysuria or other complaints.  Skin P/W, mildly diaphoretic.  Pt denies CP/SOB.  Speaking full/clear sentences, rr even/un-lab.  MAEI.  EDP at bedside for eval.

## 2013-07-09 ENCOUNTER — Ambulatory Visit (INDEPENDENT_AMBULATORY_CARE_PROVIDER_SITE_OTHER): Payer: BC Managed Care – PPO | Admitting: Family Medicine

## 2013-07-09 ENCOUNTER — Emergency Department (HOSPITAL_COMMUNITY): Payer: BC Managed Care – PPO

## 2013-07-09 ENCOUNTER — Encounter: Payer: Self-pay | Admitting: Family Medicine

## 2013-07-09 ENCOUNTER — Encounter (HOSPITAL_COMMUNITY): Payer: Self-pay | Admitting: Emergency Medicine

## 2013-07-09 ENCOUNTER — Emergency Department (HOSPITAL_COMMUNITY)
Admission: EM | Admit: 2013-07-09 | Discharge: 2013-07-09 | Disposition: A | Discharge status: Home / Self Care | Payer: BC Managed Care – PPO | Attending: Emergency Medicine | Admitting: Emergency Medicine

## 2013-07-09 VITALS — BP 130/90 | HR 95 | Temp 98.0°F | Wt 288.0 lb

## 2013-07-09 DIAGNOSIS — Z79899 Other long term (current) drug therapy: Secondary | ICD-10-CM | POA: Insufficient documentation

## 2013-07-09 DIAGNOSIS — R109 Unspecified abdominal pain: Secondary | ICD-10-CM

## 2013-07-09 DIAGNOSIS — R197 Diarrhea, unspecified: Secondary | ICD-10-CM | POA: Insufficient documentation

## 2013-07-09 DIAGNOSIS — F329 Major depressive disorder, single episode, unspecified: Secondary | ICD-10-CM | POA: Insufficient documentation

## 2013-07-09 DIAGNOSIS — Z87898 Personal history of other specified conditions: Secondary | ICD-10-CM

## 2013-07-09 DIAGNOSIS — G40909 Epilepsy, unspecified, not intractable, without status epilepticus: Secondary | ICD-10-CM

## 2013-07-09 DIAGNOSIS — F3289 Other specified depressive episodes: Secondary | ICD-10-CM | POA: Insufficient documentation

## 2013-07-09 DIAGNOSIS — R1012 Left upper quadrant pain: Secondary | ICD-10-CM | POA: Insufficient documentation

## 2013-07-09 DIAGNOSIS — Z87442 Personal history of urinary calculi: Secondary | ICD-10-CM | POA: Insufficient documentation

## 2013-07-09 DIAGNOSIS — R1030 Lower abdominal pain, unspecified: Secondary | ICD-10-CM

## 2013-07-09 DIAGNOSIS — K219 Gastro-esophageal reflux disease without esophagitis: Secondary | ICD-10-CM | POA: Insufficient documentation

## 2013-07-09 DIAGNOSIS — F411 Generalized anxiety disorder: Secondary | ICD-10-CM | POA: Insufficient documentation

## 2013-07-09 DIAGNOSIS — Z9089 Acquired absence of other organs: Secondary | ICD-10-CM | POA: Insufficient documentation

## 2013-07-09 DIAGNOSIS — G8929 Other chronic pain: Secondary | ICD-10-CM | POA: Insufficient documentation

## 2013-07-09 DIAGNOSIS — R1032 Left lower quadrant pain: Secondary | ICD-10-CM | POA: Insufficient documentation

## 2013-07-09 DIAGNOSIS — F410 Panic disorder [episodic paroxysmal anxiety] without agoraphobia: Secondary | ICD-10-CM | POA: Insufficient documentation

## 2013-07-09 DIAGNOSIS — R300 Dysuria: Secondary | ICD-10-CM | POA: Insufficient documentation

## 2013-07-09 DIAGNOSIS — R110 Nausea: Secondary | ICD-10-CM | POA: Insufficient documentation

## 2013-07-09 DIAGNOSIS — F4320 Adjustment disorder, unspecified: Secondary | ICD-10-CM

## 2013-07-09 DIAGNOSIS — G4700 Insomnia, unspecified: Secondary | ICD-10-CM

## 2013-07-09 DIAGNOSIS — Z8669 Personal history of other diseases of the nervous system and sense organs: Secondary | ICD-10-CM

## 2013-07-09 LAB — URINALYSIS, ROUTINE W REFLEX MICROSCOPIC
Bilirubin Urine: NEGATIVE
Glucose, UA: NEGATIVE mg/dL
Hgb urine dipstick: NEGATIVE
Ketones, ur: NEGATIVE mg/dL
Leukocytes, UA: NEGATIVE
Nitrite: NEGATIVE
Protein, ur: NEGATIVE mg/dL
Specific Gravity, Urine: 1.02 (ref 1.005–1.030)
Urobilinogen, UA: 0.2 mg/dL (ref 0.0–1.0)
pH: 6 (ref 5.0–8.0)

## 2013-07-09 LAB — CBC WITH DIFFERENTIAL/PLATELET
Basophils Absolute: 0 10*3/uL (ref 0.0–0.1)
Basophils Relative: 0 % (ref 0–1)
Eosinophils Absolute: 0.1 10*3/uL (ref 0.0–0.7)
Eosinophils Relative: 1 % (ref 0–5)
HCT: 50.7 % (ref 39.0–52.0)
Hemoglobin: 18.2 g/dL — ABNORMAL HIGH (ref 13.0–17.0)
Lymphocytes Relative: 21 % (ref 12–46)
Lymphs Abs: 2.7 10*3/uL (ref 0.7–4.0)
MCH: 31 pg (ref 26.0–34.0)
MCHC: 35.9 g/dL (ref 30.0–36.0)
MCV: 86.2 fL (ref 78.0–100.0)
Monocytes Absolute: 0.8 10*3/uL (ref 0.1–1.0)
Monocytes Relative: 6 % (ref 3–12)
Neutro Abs: 9.6 10*3/uL — ABNORMAL HIGH (ref 1.7–7.7)
Neutrophils Relative %: 72 % (ref 43–77)
Platelets: 320 10*3/uL (ref 150–400)
RBC: 5.88 MIL/uL — ABNORMAL HIGH (ref 4.22–5.81)
RDW: 13.2 % (ref 11.5–15.5)
WBC: 13.2 10*3/uL — ABNORMAL HIGH (ref 4.0–10.5)

## 2013-07-09 LAB — COMPREHENSIVE METABOLIC PANEL
ALT: 48 U/L (ref 0–53)
AST: 25 U/L (ref 0–37)
Albumin: 4.5 g/dL (ref 3.5–5.2)
Alkaline Phosphatase: 80 U/L (ref 39–117)
BUN: 13 mg/dL (ref 6–23)
CO2: 27 mEq/L (ref 19–32)
Calcium: 10.3 mg/dL (ref 8.4–10.5)
Chloride: 100 mEq/L (ref 96–112)
Creatinine, Ser: 0.94 mg/dL (ref 0.50–1.35)
GFR calc Af Amer: >90 mL/min (ref >90)
GFR calc non Af Amer: >90 mL/min (ref >90)
Glucose, Bld: 112 mg/dL — ABNORMAL HIGH (ref 70–99)
Potassium: 3.6 mEq/L — ABNORMAL LOW (ref 3.7–5.3)
Sodium: 142 mEq/L (ref 137–147)
Total Bilirubin: 0.8 mg/dL (ref 0.3–1.2)
Total Protein: 7.7 g/dL (ref 6.0–8.3)

## 2013-07-09 LAB — LIPASE, BLOOD: Lipase: 51 U/L (ref 11–59)

## 2013-07-09 MED ORDER — KETOROLAC TROMETHAMINE 30 MG/ML IJ SOLN
30.0000 mg | Freq: Once | INTRAMUSCULAR | Status: AC
  Administered 2013-07-09: 30 mg via INTRAVENOUS
  Filled 2013-07-09: qty 1

## 2013-07-09 MED ORDER — HYDROCODONE-ACETAMINOPHEN 10-325 MG PO TABS: 1.5000 | ORAL_TABLET | Freq: Four times a day (QID) | ORAL | Status: DC | PRN

## 2013-07-09 MED ORDER — ONDANSETRON HCL 4 MG/2ML IJ SOLN
4.0000 mg | Freq: Once | INTRAMUSCULAR | Status: AC
  Administered 2013-07-09: 4 mg via INTRAVENOUS
  Filled 2013-07-09: qty 2

## 2013-07-09 MED ORDER — HYDROMORPHONE HCL PF 1 MG/ML IJ SOLN: 1.0000 mg | Freq: Once | INTRAMUSCULAR | Status: DC

## 2013-07-09 MED ORDER — PROMETHAZINE HCL 25 MG/ML IJ SOLN
25.0000 mg | Freq: Once | INTRAMUSCULAR | Status: AC
  Administered 2013-07-09: 25 mg via INTRAVENOUS
  Filled 2013-07-09: qty 1

## 2013-07-09 MED ORDER — HYDROMORPHONE HCL PF 1 MG/ML IJ SOLN
1.0000 mg | Freq: Once | INTRAMUSCULAR | Status: AC
  Administered 2013-07-09: 1 mg via INTRAVENOUS
  Filled 2013-07-09: qty 1

## 2013-07-09 MED ORDER — ONDANSETRON 4 MG PO TBDP: 4.0000 mg | ORAL_TABLET | ORAL | Status: DC | PRN

## 2013-07-09 MED ORDER — ZOLPIDEM TARTRATE 10 MG PO TABS: 10.0000 mg | ORAL_TABLET | Freq: Every day | ORAL | Status: DC

## 2013-07-09 MED ORDER — FENTANYL CITRATE 0.05 MG/ML IJ SOLN
75.0000 ug | Freq: Once | INTRAMUSCULAR | Status: AC
  Administered 2013-07-09: 75 ug via INTRAVENOUS
  Filled 2013-07-09: qty 2

## 2013-07-09 NOTE — ED Notes (Signed)
Family at bedside. 

## 2013-07-09 NOTE — Discharge Instructions (Signed)
Flank Pain °Flank pain is pain in your side. The flank is the area of your side between your upper belly (abdomen) and your back. Pain in this area can be caused by many different things. °HOME CARE °Home care and treatment will depend on the cause of your pain. °· Rest as told by your doctor. °· Drink enough fluids to keep your pee (urine) clear or pale yellow.   °· Only take medicine as told by your doctor. °· Tell your doctor about any changes in your pain. °· Follow up with your doctor. °GET HELP RIGHT AWAY IF:  °· Your pain does not get better with medicine.   °· You have new symptoms or your symptoms get worse. °· Your pain gets worse.   °· You have belly (abdominal) pain.   °· You are short of breath.   °· You always feel sick to your stomach (nauseous).   °· You keep throwing up (vomiting).   °· You have puffiness (swelling) in your belly.   °· You feel lightheaded or you pass out (faint).   °· You have blood in your pee. °· You have a fever or lasting symptoms for more than 2 3 days. °· You have a fever and your symptoms suddenly get worse. °MAKE SURE YOU:  °· Understand these instructions. °· Will watch your condition. °· Will get help right away if you are not doing well or get worse. °Document Released: 10/20/2007 Document Revised: 10/05/2011 Document Reviewed: 08/25/2011 °[employer]® Patient Information ©2014 [employer]. ° °

## 2013-07-09 NOTE — Progress Notes (Signed)
Pre visit review using our clinic review tool, if applicable. No additional management support is needed unless otherwise documented below in the visit note.  Chronic pain continues.  He has plan for a femoral block in the near future, 07/2013.  He continues to have sig back and groin pain, at baseline, even when he isn't passing a renal stone.  He travelled to [place] with family recently and the trip was a sig strain, due to pain.  He has prev back injection but the effect has worn off.  Prev ER notes reviewed.  He isn't using dilaudid now.  Has been compliant with hydrocodone use, it helps a little but certainly doesn't eliminate the pain.  zofran helps some with nausea.    He is increasingly frustrated with the pain and the chronic discomfort.  He was hoping to get more help from the orchiectomy, then the injection, etc.  Still not sleeping well.  Lorrin Mais helps a little with insomnia.  No SI/HI but clearly downcast about recent developments. Interested in counseling.  I encouraged this.    We had talked prev about setting up a neuro eval, given his sz hx but this had been delayed given his other ongoing issues.  We talked about setting that up today.  No sz in years.   PMH and SH reviewed  ROS: See HPI, otherwise noncontributory.  Meds, vitals, and allergies reviewed.   Uncomfortable, in pain, leaning over the exam table.   Speech fluent, judgement intact Remainder deferred.

## 2013-07-09 NOTE — ED Provider Notes (Signed)
CSN: [id number]     Arrival date & time 07/09/13  1430 History   First MD Initiated Contact with Patient 07/09/13 1508     Chief Complaint  Patient presents with  . Flank Pain     (Consider location/radiation/quality/duration/timing/severity/associated sxs/prior Treatment) HPI Comments: Patient is a 33 year old male with history of kidney stones, depression, GERD who presents today with left flank pain. The pain began yesterday and has been gradually worsening. The pain worsened suddenly after leaving his PCP's office today. It is a shooting pain from his back into her left groin. This feels like his chronic flank pain. He has associated nausea without vomiting. He has associated diarrhea. Additionally he has some burning with urination. No fevers, chills, hematuria.   The history is provided by the patient. No language interpreter was used.    Past Medical History  Diagnosis Date  . GERD (gastroesophageal reflux disease)   . History of kidney stones   . Seizures Childhood  . Depression     and panic/anxiety  . Insomnia   . Kidney stones    Past Surgical History  Procedure Laterality Date  . Lithotripsy      four times over the years; most recent 10/08/12  . Appendectomy  2011  . Laparoscopic cholecystectomy  1996  . Tonsillectomy  1992  . Wrist ganglion excision  2002    Right  . Hydrocele excision Left 06/2012  . Orchiectomy Left 11/2012   Family History  Problem Relation Age of Onset  . Stroke Mother   . Liver disease Mother   . Irritable bowel syndrome Mother   . Kidney disease Mother   . Heart disease Father   . Colon polyps Father   . Colon cancer Neg Hx    History  Substance Use Topics  . Smoking status: Never Smoker   . Smokeless tobacco: Never Used  . Alcohol Use: Yes     Comment: rarely    Review of Systems  Constitutional: Negative for fever and chills.  Gastrointestinal: Positive for nausea and diarrhea. Negative for vomiting and abdominal pain.    Genitourinary: Positive for dysuria. Negative for penile swelling, scrotal swelling, difficulty urinating and testicular pain.  All other systems reviewed and are negative.     Allergies  Review of patient's allergies indicates no known allergies.  Home Medications   Prior to Admission medications   Medication Sig Start Date End Date Taking? Authorizing Provider  docusate sodium (COLACE) 100 MG capsule Take 100 mg by mouth 2 (two) times daily.    Yes Historical Provider, MD  gabapentin (NEURONTIN) 300 MG capsule Take 1,200 mg by mouth at bedtime.    Yes Historical Provider, MD  HYDROcodone-acetaminophen (NORCO) 10-325 MG per tablet Take 1.5 tablets by mouth every 6 (six) hours as needed for severe pain. Fill on/after 07/09/13. 07/09/13  Yes Tonia Ghent, MD  ibuprofen (ADVIL,MOTRIN) 200 MG tablet Take 800 mg by mouth every 6 (six) hours as needed (For pain.).    Yes Historical Provider, MD  indapamide (LOZOL) 2.5 MG tablet Take 2.5 mg by mouth every morning.    Yes Historical Provider, MD  ondansetron (ZOFRAN-ODT) 4 MG disintegrating tablet Take 1 tablet (4 mg total) by mouth every 4 (four) hours as needed for nausea or vomiting. 07/09/13  Yes Tonia Ghent, MD  pantoprazole (PROTONIX) 40 MG tablet Take 40 mg by mouth 2 (two) times daily.   Yes Historical Provider, MD  potassium citrate (UROCIT-K) 10 MEQ (1080  MG) SR tablet Take 10 mEq by mouth 2 (two) times daily.   Yes Historical Provider, MD  zolpidem (AMBIEN) 10 MG tablet Take 1 tablet (10 mg total) by mouth at bedtime. 07/09/13  Yes Tonia Ghent, MD   BP 133/87  Pulse 76  Temp(Src) 98 F (36.7 C) (Oral)  Resp 18  SpO2 97% Physical Exam  Nursing note and vitals reviewed. Constitutional: He is oriented to person, place, and time. He appears well-developed and well-nourished. No distress.  HENT:  Head: Normocephalic and atraumatic.  Right Ear: External ear normal.  Left Ear: External ear normal.  Nose: Nose normal.  Eyes:  Conjunctivae are normal.  Neck: Normal range of motion. No tracheal deviation present.  Cardiovascular: Normal rate, regular rhythm and normal heart sounds.   Pulmonary/Chest: Effort normal and breath sounds normal. No stridor.  Abdominal: Soft. He exhibits no distension. There is tenderness in the left upper quadrant and left lower quadrant.  Musculoskeletal: Normal range of motion.       Back:  Neurological: He is alert and oriented to person, place, and time.  Skin: Skin is warm and dry. He is not diaphoretic.  Psychiatric: He has a normal mood and affect. His behavior is normal.    ED Course  Procedures (including critical care time) Labs Review Labs Reviewed  CBC WITH DIFFERENTIAL - Abnormal; Notable for the following:    WBC 13.2 (*)    RBC 5.88 (*)    Hemoglobin 18.2 (*)    Neutro Abs 9.6 (*)    All other components within normal limits  COMPREHENSIVE METABOLIC PANEL - Abnormal; Notable for the following:    Potassium 3.6 (*)    Glucose, Bld 112 (*)    All other components within normal limits  URINALYSIS, ROUTINE W REFLEX MICROSCOPIC  LIPASE, BLOOD    Imaging Review US Renal  07/09/2013   CLINICAL DATA:  Hydronephrosis.  EXAM: RENAL/URINARY TRACT ULTRASOUND COMPLETE  COMPARISON:  Multiple prior CT and ultrasound exams.  FINDINGS: Right Kidney:  Length: 12.9 cm. Echogenicity within normal limits. No mass or hydronephrosis visualized.  Left Kidney:  Length: 13.3 cm. Echogenicity within normal limits. No mass or hydronephrosis visualized.  Bladder:  Appears normal for degree of bladder distention.  IMPRESSION: Negative for stones or hydronephrosis. Previously seen left renal 5 mm calculus not identified on today's exam. This may be secondary to technical degradation due to obese body habitus.   Electronically Signed   By: Dereck Ligas M.D.   On: 07/09/2013 19:09     EKG Interpretation None      MDM   Final diagnoses:  Left flank pain    Patient presents to ED  with left flank pain. TTP. No red flags. Labs and imaging unremarkable. Patient feels improved after IV narcotics in ED. He reports that he does not need pain med rx because he just received prescription from PCP. Discussed reasons to return to ED immediately. Vital signs stable for discharge. Discussed case with Dr. Jeneen Rinks who agrees with plan. Patient / Family / Caregiver informed of clinical course, understand medical decision-making process, and agree with plan.   Elwyn Lade, PA-C 07/11/13 1115

## 2013-07-09 NOTE — ED Notes (Signed)
Waiting for US

## 2013-07-09 NOTE — Patient Instructions (Signed)
Casey Caldwell will call about your referral. I'll await the results on the block.  If you are getting worse in the meantime, then let me know.

## 2013-07-09 NOTE — ED Notes (Signed)
Pt c/o left flank pain that started last night. Pt has PMH kidney stones and sure this is another one. Pt states that it is uncomfortable but can still urinate.

## 2013-07-09 NOTE — ED Notes (Signed)
US at bedside

## 2013-07-10 ENCOUNTER — Encounter: Payer: Self-pay | Admitting: Family Medicine

## 2013-07-10 NOTE — Assessment & Plan Note (Signed)
Continue prn hydrocodone, okay for outpatient f/u.   D/w pt about counseling for adjustment reaction and this is reasonable.  He contracts for safety.   He has the upcoming block scheduled.  I hope that helps his situation.  We talked about his situation in detail.  I don't see other reasonable options in the meantime.  >25 minutes spent in face to face time with patient, >50% spent in counselling or coordination of care.

## 2013-07-10 NOTE — Assessment & Plan Note (Signed)
Continue ambien prn

## 2013-07-10 NOTE — Assessment & Plan Note (Signed)
Refer to neuro 

## 2013-07-14 NOTE — ED Provider Notes (Signed)
Medical screening examination/treatment/procedure(s) were performed by non-physician practitioner and as supervising physician I was immediately available for consultation/collaboration.   EKG Interpretation None        Tanna Furry, MD 07/14/13 [phone]

## 2013-07-15 ENCOUNTER — Ambulatory Visit (INDEPENDENT_AMBULATORY_CARE_PROVIDER_SITE_OTHER): Payer: BC Managed Care – PPO | Admitting: Psychology

## 2013-07-15 DIAGNOSIS — F4323 Adjustment disorder with mixed anxiety and depressed mood: Secondary | ICD-10-CM

## 2013-07-16 ENCOUNTER — Telehealth: Payer: Self-pay | Admitting: Family Medicine

## 2013-07-16 ENCOUNTER — Encounter: Payer: Self-pay | Admitting: Family Medicine

## 2013-07-16 MED ORDER — ESCITALOPRAM OXALATE 10 MG PO TABS: 10.0000 mg | ORAL_TABLET | Freq: Every day | ORAL | Status: DC

## 2013-07-16 NOTE — Telephone Encounter (Signed)
Call pt.  D/w pt about options. No SI/HI.  Reasonable to continue counseling.  meds options d/w pt.  lexapro is reasonable.  Sent. Routine instructions, side effects, cautions d/w pt.   He'll update me in a few days, phone or mychart, sooner if needed.   D/w pt about breathing exercises for anxiety episodes.  He agrees.   Okay for outpatient/phone follow up.

## 2013-07-22 ENCOUNTER — Ambulatory Visit (INDEPENDENT_AMBULATORY_CARE_PROVIDER_SITE_OTHER): Payer: BC Managed Care – PPO | Admitting: Psychology

## 2013-07-22 DIAGNOSIS — F4323 Adjustment disorder with mixed anxiety and depressed mood: Secondary | ICD-10-CM

## 2013-07-23 ENCOUNTER — Encounter: Payer: Self-pay | Admitting: Neurology

## 2013-07-23 ENCOUNTER — Ambulatory Visit (INDEPENDENT_AMBULATORY_CARE_PROVIDER_SITE_OTHER): Payer: BC Managed Care – PPO | Admitting: Neurology

## 2013-07-23 VITALS — BP 120/78 | HR 81 | Ht 72.0 in | Wt 284.0 lb

## 2013-07-23 DIAGNOSIS — R569 Unspecified convulsions: Secondary | ICD-10-CM

## 2013-07-23 MED ORDER — LAMOTRIGINE ER 25 MG PO TB24: 25.0000 mg | ORAL_TABLET | Freq: Every day | ORAL | Status: DC
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
## 2013-07-29 ENCOUNTER — Emergency Department (HOSPITAL_COMMUNITY): Payer: BC Managed Care – PPO

## 2013-07-29 ENCOUNTER — Encounter (HOSPITAL_COMMUNITY): Payer: Self-pay | Admitting: Emergency Medicine

## 2013-07-29 ENCOUNTER — Emergency Department (HOSPITAL_COMMUNITY)
Admission: EM | Admit: 2013-07-29 | Discharge: 2013-07-29 | Disposition: A | Discharge status: Home / Self Care | Payer: BC Managed Care – PPO | Attending: Emergency Medicine | Admitting: Emergency Medicine

## 2013-07-29 DIAGNOSIS — F3289 Other specified depressive episodes: Secondary | ICD-10-CM | POA: Diagnosis not present

## 2013-07-29 DIAGNOSIS — Z87442 Personal history of urinary calculi: Secondary | ICD-10-CM | POA: Diagnosis not present

## 2013-07-29 DIAGNOSIS — G4700 Insomnia, unspecified: Secondary | ICD-10-CM | POA: Diagnosis not present

## 2013-07-29 DIAGNOSIS — K219 Gastro-esophageal reflux disease without esophagitis: Secondary | ICD-10-CM | POA: Insufficient documentation

## 2013-07-29 DIAGNOSIS — G40909 Epilepsy, unspecified, not intractable, without status epilepticus: Secondary | ICD-10-CM | POA: Insufficient documentation

## 2013-07-29 DIAGNOSIS — F329 Major depressive disorder, single episode, unspecified: Secondary | ICD-10-CM | POA: Insufficient documentation

## 2013-07-29 DIAGNOSIS — Z79899 Other long term (current) drug therapy: Secondary | ICD-10-CM | POA: Diagnosis not present

## 2013-07-29 DIAGNOSIS — R109 Unspecified abdominal pain: Secondary | ICD-10-CM

## 2013-07-29 DIAGNOSIS — F410 Panic disorder [episodic paroxysmal anxiety] without agoraphobia: Secondary | ICD-10-CM | POA: Diagnosis not present

## 2013-07-29 DIAGNOSIS — Z9089 Acquired absence of other organs: Secondary | ICD-10-CM | POA: Insufficient documentation

## 2013-07-29 LAB — BASIC METABOLIC PANEL
Anion gap: 15 (ref 5–15)
BUN: 12 mg/dL (ref 6–23)
CO2: 28 mEq/L (ref 19–32)
Calcium: 10.8 mg/dL — ABNORMAL HIGH (ref 8.4–10.5)
Chloride: 98 mEq/L (ref 96–112)
Creatinine, Ser: 0.88 mg/dL (ref 0.50–1.35)
GFR calc Af Amer: >90 mL/min (ref >90)
GFR calc non Af Amer: >90 mL/min (ref >90)
Glucose, Bld: 82 mg/dL (ref 70–99)
Potassium: 4 mEq/L (ref 3.7–5.3)
Sodium: 141 mEq/L (ref 137–147)

## 2013-07-29 LAB — CBC WITH DIFFERENTIAL/PLATELET
Basophils Absolute: 0 10*3/uL (ref 0.0–0.1)
Basophils Relative: 0 % (ref 0–1)
Eosinophils Absolute: 0.2 10*3/uL (ref 0.0–0.7)
Eosinophils Relative: 2 % (ref 0–5)
HCT: 50.5 % (ref 39.0–52.0)
Hemoglobin: 18.4 g/dL — ABNORMAL HIGH (ref 13.0–17.0)
Lymphocytes Relative: 28 % (ref 12–46)
Lymphs Abs: 2.8 10*3/uL (ref 0.7–4.0)
MCH: 31.3 pg (ref 26.0–34.0)
MCHC: 36.4 g/dL — ABNORMAL HIGH (ref 30.0–36.0)
MCV: 86 fL (ref 78.0–100.0)
Monocytes Absolute: 1 10*3/uL (ref 0.1–1.0)
Monocytes Relative: 10 % (ref 3–12)
Neutro Abs: 6.1 10*3/uL (ref 1.7–7.7)
Neutrophils Relative %: 60 % (ref 43–77)
Platelets: 359 10*3/uL (ref 150–400)
RBC: 5.87 MIL/uL — ABNORMAL HIGH (ref 4.22–5.81)
RDW: 13.1 % (ref 11.5–15.5)
WBC: 10.1 10*3/uL (ref 4.0–10.5)

## 2013-07-29 LAB — URINALYSIS, ROUTINE W REFLEX MICROSCOPIC
Bilirubin Urine: NEGATIVE
Glucose, UA: NEGATIVE mg/dL
Hgb urine dipstick: NEGATIVE
Ketones, ur: NEGATIVE mg/dL
Leukocytes, UA: NEGATIVE
Nitrite: NEGATIVE
Protein, ur: NEGATIVE mg/dL
Specific Gravity, Urine: 1.015 (ref 1.005–1.030)
Urobilinogen, UA: 0.2 mg/dL (ref 0.0–1.0)
pH: 8 (ref 5.0–8.0)

## 2013-07-29 MED ORDER — ONDANSETRON HCL 4 MG/2ML IJ SOLN
4.0000 mg | Freq: Once | INTRAMUSCULAR | Status: AC
  Administered 2013-07-29: 4 mg via INTRAVENOUS
  Filled 2013-07-29: qty 2

## 2013-07-29 MED ORDER — HYDROMORPHONE HCL PF 1 MG/ML IJ SOLN
1.0000 mg | Freq: Once | INTRAMUSCULAR | Status: AC
  Administered 2013-07-29: 1 mg via INTRAVENOUS
  Filled 2013-07-29: qty 1

## 2013-07-29 NOTE — ED Provider Notes (Signed)
CSN: [id number]     Arrival date & time 07/29/13  1308 History   First MD Initiated Contact with Patient 07/29/13 1334     Chief Complaint  Patient presents with  . Flank Pain     (Consider location/radiation/quality/duration/timing/severity/associated sxs/prior Treatment) HPI Comments: Patient with a history of Kidney Stones presents today with right flank pain.  He reports that the pain has been intermittent for the past two days.  Pain radiates to the groin area.  He reports that the pain feels similar to the pain that he has had with kidney stones in the past.  He reports that he has had some dysuria.  He denies increased urinary frequency, hematuria, or urgency.  Denies fever or chills.  He reports associated nausea, but denies vomiting.  Denies diarrhea or constipation.  He currently takes Hydrocodone for chronic testicular pain.  Hydrocodone gives him mild relief.   Urologist is Dr. Rosana Hoes at [facility].  Patient is a 33 y.o. male presenting with flank pain. The history is provided by the patient.  Flank Pain    Past Medical History  Diagnosis Date  . GERD (gastroesophageal reflux disease)   . History of kidney stones   . Seizures Childhood  . Depression     and panic/anxiety  . Insomnia   . Kidney stones    Past Surgical History  Procedure Laterality Date  . Lithotripsy      four times over the years; most recent 10/08/12  . Appendectomy  2011  . Laparoscopic cholecystectomy  1996  . Tonsillectomy  1992  . Wrist ganglion excision  2002    Right  . Hydrocele excision Left 06/2012  . Orchiectomy Left 11/2012   Family History  Problem Relation Age of Onset  . Stroke Mother   . Liver disease Mother   . Irritable bowel syndrome Mother   . Kidney disease Mother   . Heart disease Father   . Colon polyps Father   . Colon cancer Neg Hx    History  Substance Use Topics  . Smoking status: Never Smoker   . Smokeless tobacco: Never Used  . Alcohol Use: Yes     Comment: rarely     Review of Systems  Genitourinary: Positive for flank pain.  All other systems reviewed and are negative.     Allergies  Review of patient's allergies indicates no known allergies.  Home Medications   Prior to Admission medications   Medication Sig Start Date End Date Taking? Authorizing Provider  docusate sodium (COLACE) 100 MG capsule Take 100 mg by mouth 2 (two) times daily.    Yes Historical Provider, MD  escitalopram (LEXAPRO) 10 MG tablet Take 10 mg by mouth every morning.   Yes Historical Provider, MD  HYDROcodone-acetaminophen (NORCO) 10-325 MG per tablet Take 1.5 tablets by mouth every 6 (six) hours as needed for moderate pain or severe pain.   Yes Historical Provider, MD  ibuprofen (ADVIL,MOTRIN) 200 MG tablet Take 600-800 mg by mouth every 6 (six) hours as needed for mild pain or moderate pain.    Yes Historical Provider, MD  indapamide (LOZOL) 2.5 MG tablet Take 2.5 mg by mouth every morning.    Yes Historical Provider, MD  LamoTRIgine (LAMICTAL XR) 25 MG TB24 tablet Take 25-100 mg by mouth at bedtime. Take 1 tab daily for 2 weeks. Then take 2 tabs daily for 2 weeks. Then take 4 tabs daily.   Yes Historical Provider, MD  ondansetron (ZOFRAN-ODT) 4 MG disintegrating tablet  Take 4 mg by mouth every 4 (four) hours as needed for nausea or vomiting.   Yes Historical Provider, MD  pantoprazole (PROTONIX) 40 MG tablet Take 40 mg by mouth 2 (two) times daily.   Yes Historical Provider, MD  potassium chloride (K-DUR) 10 MEQ tablet Take 10 mEq by mouth 2 (two) times daily.   Yes Historical Provider, MD  zolpidem (AMBIEN) 10 MG tablet Take 10 mg by mouth at bedtime.   Yes Historical Provider, MD   BP 136/91  Pulse 71  Temp(Src) 97.8 F (36.6 C) (Oral)  Resp 20  SpO2 97% Physical Exam  Nursing note and vitals reviewed. Constitutional: He appears well-developed and well-nourished.  HENT:  Head: Normocephalic and atraumatic.  Mouth/Throat: Oropharynx is clear and moist.  Neck:  Normal range of motion. Neck supple.  Cardiovascular: Normal rate, regular rhythm and normal heart sounds.   Pulmonary/Chest: Effort normal and breath sounds normal.  Abdominal: Soft. Bowel sounds are normal. He exhibits no distension and no mass. There is CVA tenderness. There is no rebound and no guarding.  Right CVA tenderness  Genitourinary:    Right testis shows no mass, no swelling and no tenderness.  Surgically absent left testicle.   Lymphadenopathy:       Right: No inguinal adenopathy present.  Neurological: He is alert.  Skin: Skin is warm and dry.  Psychiatric: He has a normal mood and affect.    ED Course  Procedures (including critical care time) Labs Review Labs Reviewed  CBC WITH DIFFERENTIAL - Abnormal; Notable for the following:    RBC 5.87 (*)    Hemoglobin 18.4 (*)    MCHC 36.4 (*)    All other components within normal limits  BASIC METABOLIC PANEL - Abnormal; Notable for the following:    Calcium 10.8 (*)    All other components within normal limits  URINALYSIS, ROUTINE W REFLEX MICROSCOPIC    Imaging Review No results found.   EKG Interpretation None     4:15 PM Patient reports mild improvement in pain.  Tolerating PO liquids.   MDM   Final diagnoses:  None  Patient with a history of Kidney Stones presents today with flank pain.  Patient with numerous ED visits for the same.  Labs today unremarkable.  UA not showing any evidence of infection.  CT ab/pelvis not showing any evidence of hydronephrosis or kidney stone.  Feel that the patient is stable for discharge.  Return precautions given.      Hyman Bible, PA-C 07/31/13 [place], PA-C 07/31/13 2232

## 2013-07-29 NOTE — ED Notes (Signed)
Patient presents today with a chief complaint of right flank pain since Saturday 07/27/13. Patient reports history of kidney stones and states pain is identical to previous stone pain.

## 2013-07-29 NOTE — Discharge Instructions (Signed)
Flank Pain °Flank pain refers to pain that is located on the side of the body between the upper abdomen and the back. The pain may occur over a short period of time (acute) or may be long-term or reoccurring (chronic). It may be mild or severe. Flank pain can be caused by many things. °CAUSES  °Some of the more common causes of flank pain include: °· Muscle strains.   °· Muscle spasms.   °· A disease of your spine (vertebral disk disease).   °· A lung infection (pneumonia).   °· Fluid around your lungs (pulmonary edema).   °· A kidney infection.   °· Kidney stones.   °· A very painful skin rash caused by the chickenpox virus (shingles).   °· Gallbladder disease.   °HOME CARE INSTRUCTIONS  °Home care will depend on the cause of your pain. In general, °· Rest as directed by your caregiver. °· Drink enough fluids to keep your urine clear or pale yellow. °· Only take over-the-counter or prescription medicines as directed by your caregiver. Some medicines may help relieve the pain. °· Tell your caregiver about any changes in your pain. °· Follow up with your caregiver as directed. °SEEK IMMEDIATE MEDICAL CARE IF:  °· Your pain is not controlled with medicine.   °· You have new or worsening symptoms. °· Your pain increases.   °· You have abdominal pain.   °· You have shortness of breath.   °· You have persistent nausea or vomiting.   °· You have swelling in your abdomen.   °· You feel faint or pass out.   °· You have blood in your urine. °· You have a fever or persistent symptoms for more than 2-3 days. °· You have a fever and your symptoms suddenly get worse. °MAKE SURE YOU:  °· Understand these instructions. °· Will watch your condition. °· Will get help right away if you are not doing well or get worse. °Document Released: 03/03/2005 Document Revised: 10/05/2011 Document Reviewed: 08/25/2011 °ExitCare® Patient Information ©2015 [employer]. This information is not intended to replace advice given to you by your  health care provider. Make sure you discuss any questions you have with your health care provider. ° °

## 2013-07-31 NOTE — ED Provider Notes (Signed)
Medical screening examination/treatment/procedure(s) were performed by non-physician practitioner and as supervising physician I was immediately available for consultation/collaboration.   EKG Interpretation None        Houston Siren III, MD 07/31/13 2342

## 2013-08-02 ENCOUNTER — Ambulatory Visit (INDEPENDENT_AMBULATORY_CARE_PROVIDER_SITE_OTHER): Payer: BC Managed Care – PPO | Admitting: Psychology

## 2013-08-02 DIAGNOSIS — F4323 Adjustment disorder with mixed anxiety and depressed mood: Secondary | ICD-10-CM

## 2013-08-05 ENCOUNTER — Other Ambulatory Visit: Payer: BC Managed Care – PPO

## 2013-08-06 ENCOUNTER — Telehealth: Payer: Self-pay | Admitting: Neurology

## 2013-08-06 ENCOUNTER — Ambulatory Visit (INDEPENDENT_AMBULATORY_CARE_PROVIDER_SITE_OTHER): Payer: BC Managed Care – PPO | Admitting: Family Medicine

## 2013-08-06 ENCOUNTER — Encounter: Payer: Self-pay | Admitting: Family Medicine

## 2013-08-06 VITALS — BP 142/90 | HR 93 | Temp 98.1°F | Wt 287.0 lb

## 2013-08-06 DIAGNOSIS — G40909 Epilepsy, unspecified, not intractable, without status epilepticus: Secondary | ICD-10-CM

## 2013-08-06 DIAGNOSIS — G4700 Insomnia, unspecified: Secondary | ICD-10-CM

## 2013-08-06 DIAGNOSIS — F341 Dysthymic disorder: Secondary | ICD-10-CM

## 2013-08-06 DIAGNOSIS — R1030 Lower abdominal pain, unspecified: Secondary | ICD-10-CM

## 2013-08-06 DIAGNOSIS — F419 Anxiety disorder, unspecified: Secondary | ICD-10-CM

## 2013-08-06 DIAGNOSIS — F32A Depression, unspecified: Secondary | ICD-10-CM

## 2013-08-06 DIAGNOSIS — F329 Major depressive disorder, single episode, unspecified: Secondary | ICD-10-CM

## 2013-08-06 DIAGNOSIS — R109 Unspecified abdominal pain: Secondary | ICD-10-CM

## 2013-08-06 MED ORDER — HYDROCODONE-ACETAMINOPHEN 10-325 MG PO TABS: 1.5000 | ORAL_TABLET | Freq: Four times a day (QID) | ORAL | Status: DC | PRN

## 2013-08-06 MED ORDER — ZOLPIDEM TARTRATE 10 MG PO TABS: 10.0000 mg | ORAL_TABLET | Freq: Every day | ORAL | Status: DC

## 2013-08-06 NOTE — Progress Notes (Signed)
Pre visit review using our clinic review tool, if applicable. No additional management support is needed unless otherwise documented below in the visit note.  Chronic pain.  He had the trial run on the block and it helped a lot for about 2 days and that counts as a positive response.  The f/u is about 1 month out for the next injection/block.    His mood improved when his back/groin pain was better.  He started the lexapro in the meantime.  Wife has noted that his mood improved a few weeks after starting the medicine.  No SI/HI.    He is clearly better than the last few times I saw him, per his report, overall.    Neuro notes d/w pt, re: SZ d/o.   He missed his EEG and he'll call about rescheduling that.  His sleep was completely disordered the night before and he was too drowsy to go.  He'll call about rescheduling.  Started on lamictal today, he was awaiting the pharmacy to get it when he was in town to pick it up.    Sleep is still variable, sometimes better than others.  Lorrin Mais helps w/o ADE.    Meds, vitals, and allergies reviewed.   ROS: See HPI.  Otherwise, noncontributory.  nad ncat Speech wnl Neck supple rrr ctab abd soft Ext w/o edema Affect improved from prev.

## 2013-08-06 NOTE — Patient Instructions (Signed)
Take care.  Glad to see you.  I'll await the follow up notes on your next injection.

## 2013-08-06 NOTE — Telephone Encounter (Signed)
Pt no showed 08/05/13 EEG but still has an upcoming follow up scheduled w/ Dr. Delice Lesch. No show letter mailed to pt  /Sherri S.

## 2013-08-07 NOTE — Assessment & Plan Note (Signed)
Per neuro, just started lamictal.  D/w pt.

## 2013-08-07 NOTE — Assessment & Plan Note (Signed)
Improved a lot temporarily after the block.  Will have f/u for the next block.  I am optimistic about his situation.  We talked about eventual taper off hydrocodone when the last block is done.  Continue hydrocodone for now.  He agrees.  No ADE.

## 2013-08-07 NOTE — Assessment & Plan Note (Signed)
Continue ambien, use nightly.  No ADE.  Okay for outpatient f/u.

## 2013-08-07 NOTE — Assessment & Plan Note (Signed)
Affect and sx improved with counseling and meds.  Continue as is.  Wife noted improvement in patient.  App help of all involved.

## 2013-08-26 ENCOUNTER — Emergency Department (HOSPITAL_COMMUNITY)
Admission: EM | Admit: 2013-08-26 | Discharge: 2013-08-26 | Disposition: A | Discharge status: Home / Self Care | Payer: BC Managed Care – PPO | Attending: Emergency Medicine | Admitting: Emergency Medicine

## 2013-08-26 ENCOUNTER — Emergency Department (HOSPITAL_COMMUNITY): Payer: BC Managed Care – PPO

## 2013-08-26 ENCOUNTER — Encounter (HOSPITAL_COMMUNITY): Payer: Self-pay | Admitting: Emergency Medicine

## 2013-08-26 DIAGNOSIS — K219 Gastro-esophageal reflux disease without esophagitis: Secondary | ICD-10-CM | POA: Insufficient documentation

## 2013-08-26 DIAGNOSIS — Z79899 Other long term (current) drug therapy: Secondary | ICD-10-CM | POA: Insufficient documentation

## 2013-08-26 DIAGNOSIS — F329 Major depressive disorder, single episode, unspecified: Secondary | ICD-10-CM | POA: Insufficient documentation

## 2013-08-26 DIAGNOSIS — R109 Unspecified abdominal pain: Secondary | ICD-10-CM | POA: Insufficient documentation

## 2013-08-26 DIAGNOSIS — Z9089 Acquired absence of other organs: Secondary | ICD-10-CM | POA: Insufficient documentation

## 2013-08-26 DIAGNOSIS — G40909 Epilepsy, unspecified, not intractable, without status epilepticus: Secondary | ICD-10-CM | POA: Insufficient documentation

## 2013-08-26 DIAGNOSIS — F411 Generalized anxiety disorder: Secondary | ICD-10-CM | POA: Insufficient documentation

## 2013-08-26 DIAGNOSIS — F3289 Other specified depressive episodes: Secondary | ICD-10-CM | POA: Insufficient documentation

## 2013-08-26 DIAGNOSIS — N200 Calculus of kidney: Secondary | ICD-10-CM | POA: Insufficient documentation

## 2013-08-26 LAB — URINALYSIS, ROUTINE W REFLEX MICROSCOPIC
Bilirubin Urine: NEGATIVE
Glucose, UA: NEGATIVE mg/dL
Hgb urine dipstick: NEGATIVE
Ketones, ur: NEGATIVE mg/dL
Leukocytes, UA: NEGATIVE
Nitrite: NEGATIVE
Protein, ur: 30 mg/dL — AB
Specific Gravity, Urine: 1.022 (ref 1.005–1.030)
Urobilinogen, UA: 0.2 mg/dL (ref 0.0–1.0)
pH: 8.5 — ABNORMAL HIGH (ref 5.0–8.0)

## 2013-08-26 LAB — URINE MICROSCOPIC-ADD ON

## 2013-08-26 LAB — COMPREHENSIVE METABOLIC PANEL
ALT: 54 U/L — ABNORMAL HIGH (ref 0–53)
AST: 35 U/L (ref 0–37)
Albumin: 4.8 g/dL (ref 3.5–5.2)
Alkaline Phosphatase: 66 U/L (ref 39–117)
Anion gap: 13 (ref 5–15)
BUN: 8 mg/dL (ref 6–23)
CO2: 29 mEq/L (ref 19–32)
Calcium: 10.6 mg/dL — ABNORMAL HIGH (ref 8.4–10.5)
Chloride: 100 mEq/L (ref 96–112)
Creatinine, Ser: 0.88 mg/dL (ref 0.50–1.35)
GFR calc Af Amer: >90 mL/min (ref >90)
GFR calc non Af Amer: >90 mL/min (ref >90)
Glucose, Bld: 104 mg/dL — ABNORMAL HIGH (ref 70–99)
Potassium: 3.8 mEq/L (ref 3.7–5.3)
Sodium: 142 mEq/L (ref 137–147)
Total Bilirubin: 0.7 mg/dL (ref 0.3–1.2)
Total Protein: 7.8 g/dL (ref 6.0–8.3)

## 2013-08-26 LAB — CBC
HCT: 48.9 % (ref 39.0–52.0)
Hemoglobin: 17.4 g/dL — ABNORMAL HIGH (ref 13.0–17.0)
MCH: 30.6 pg (ref 26.0–34.0)
MCHC: 35.6 g/dL (ref 30.0–36.0)
MCV: 85.9 fL (ref 78.0–100.0)
Platelets: 321 10*3/uL (ref 150–400)
RBC: 5.69 MIL/uL (ref 4.22–5.81)
RDW: 13.7 % (ref 11.5–15.5)
WBC: 7.9 10*3/uL (ref 4.0–10.5)

## 2013-08-26 MED ORDER — HYDROMORPHONE HCL PF 1 MG/ML IJ SOLN: 1.0000 mg | Freq: Once | INTRAMUSCULAR | Status: DC

## 2013-08-26 MED ORDER — HYDROMORPHONE HCL PF 2 MG/ML IJ SOLN
2.0000 mg | Freq: Once | INTRAMUSCULAR | Status: AC
Start: 2013-08-26 — End: 2013-08-26
  Administered 2013-08-26: 2 mg via INTRAVENOUS
  Filled 2013-08-26: qty 1

## 2013-08-26 MED ORDER — HYDROMORPHONE HCL PF 1 MG/ML IJ SOLN
1.0000 mg | Freq: Once | INTRAMUSCULAR | Status: AC
  Administered 2013-08-26: 1 mg via INTRAVENOUS
  Filled 2013-08-26: qty 1

## 2013-08-26 MED ORDER — ONDANSETRON HCL 4 MG/2ML IJ SOLN: 4.0000 mg | Freq: Once | INTRAMUSCULAR | Status: DC

## 2013-08-26 MED ORDER — ONDANSETRON HCL 4 MG/2ML IJ SOLN
4.0000 mg | Freq: Once | INTRAMUSCULAR | Status: AC
  Administered 2013-08-26: 4 mg via INTRAVENOUS
  Filled 2013-08-26: qty 2

## 2013-08-26 NOTE — Discharge Instructions (Signed)

## 2013-08-26 NOTE — ED Notes (Signed)
Pt c/o bilateral flank pain, majority in left side, also c/o pani in groin. Pt has hx of kidney stones.

## 2013-08-26 NOTE — ED Notes (Signed)
US at bedside

## 2013-08-26 NOTE — ED Notes (Signed)
Pt aware that urine sample is needed. Unable at this time

## 2013-08-26 NOTE — ED Provider Notes (Signed)
CSN: [id number]     Arrival date & time 08/26/13  1041 History   First MD Initiated Contact with Patient 08/26/13 1128     Chief Complaint  Patient presents with  . Nephrolithiasis     (Consider location/radiation/quality/duration/timing/severity/associated sxs/prior Treatment) Patient is a 33 y.o. male presenting with abdominal pain. The history is provided by the patient.  Abdominal Pain Pain location:  L flank Pain quality: aching and sharp   Pain radiates to:  Groin Pain severity:  Severe Onset quality:  Gradual Timing:  Constant Progression:  Worsening Chronicity:  Chronic Context: not alcohol use, not medication withdrawal and not recent illness   Relieved by:  Nothing Worsened by:  Nothing tried Ineffective treatments: narcotics. Associated symptoms: nausea and vomiting   Associated symptoms: no cough, no fever and no shortness of breath     Past Medical History  Diagnosis Date  . GERD (gastroesophageal reflux disease)   . History of kidney stones   . Seizures Childhood  . Depression     and panic/anxiety  . Insomnia   . Kidney stones    Past Surgical History  Procedure Laterality Date  . Lithotripsy      four times over the years; most recent 10/08/12  . Appendectomy  2011  . Laparoscopic cholecystectomy  1996  . Tonsillectomy  1992  . Wrist ganglion excision  2002    Right  . Hydrocele excision Left 06/2012  . Orchiectomy Left 11/2012   Family History  Problem Relation Age of Onset  . Stroke Mother   . Liver disease Mother   . Irritable bowel syndrome Mother   . Kidney disease Mother   . Heart disease Father   . Colon polyps Father   . Colon cancer Neg Hx    History  Substance Use Topics  . Smoking status: Never Smoker   . Smokeless tobacco: Never Used  . Alcohol Use: Yes     Comment: rarely    Review of Systems  Constitutional: Negative for fever.  Respiratory: Negative for cough and shortness of breath.   Gastrointestinal: Positive for  nausea, vomiting and abdominal pain.  All other systems reviewed and are negative.     Allergies  Review of patient's allergies indicates no known allergies.  Home Medications   Prior to Admission medications   Medication Sig Start Date End Date Taking? Authorizing Provider  docusate sodium (COLACE) 100 MG capsule Take 100 mg by mouth 2 (two) times daily.    Yes Historical Provider, MD  escitalopram (LEXAPRO) 10 MG tablet Take 10 mg by mouth every morning.   Yes Historical Provider, MD  HYDROcodone-acetaminophen (NORCO) 10-325 MG per tablet Take 1.5 tablets by mouth every 6 (six) hours as needed for moderate pain or severe pain. 08/06/13  Yes Tonia Ghent, MD  ibuprofen (ADVIL,MOTRIN) 200 MG tablet Take 600-800 mg by mouth every 6 (six) hours as needed for mild pain or moderate pain.    Yes Historical Provider, MD  indapamide (LOZOL) 2.5 MG tablet Take 2.5 mg by mouth every morning.    Yes Historical Provider, MD  LamoTRIgine (LAMICTAL XR) 25 MG TB24 tablet Take 25-100 mg by mouth at bedtime. Take 1 tab daily for 2 weeks. Then take 2 tabs daily for 2 weeks. Then take 4 tabs daily.   Yes Historical Provider, MD  ondansetron (ZOFRAN-ODT) 4 MG disintegrating tablet Take 4 mg by mouth every 4 (four) hours as needed for nausea or vomiting.   Yes Historical Provider, MD  pantoprazole (PROTONIX) 40 MG tablet Take 40 mg by mouth 2 (two) times daily.   Yes Historical Provider, MD  potassium chloride (K-DUR) 10 MEQ tablet Take 10 mEq by mouth 2 (two) times daily.   Yes Historical Provider, MD  zolpidem (AMBIEN) 10 MG tablet Take 1 tablet (10 mg total) by mouth at bedtime. 08/06/13  Yes Tonia Ghent, MD   BP 143/113  Pulse 79  Temp(Src) 98.5 F (36.9 C) (Oral)  Resp 22  SpO2 96% Physical Exam  Constitutional: He is oriented to person, place, and time. He appears well-developed and well-nourished. No distress.  HENT:  Head: Normocephalic and atraumatic.  Mouth/Throat: Oropharynx is clear  and moist. No oropharyngeal exudate.  Eyes: EOM are normal. Pupils are equal, round, and reactive to light.  Neck: Normal range of motion. Neck supple.  Cardiovascular: Normal rate and regular rhythm.  Exam reveals no friction rub.   No murmur heard. Pulmonary/Chest: Effort normal and breath sounds normal. No respiratory distress. He has no wheezes. He has no rales.  Abdominal: Soft. He exhibits no distension. There is tenderness (L flank, L CVA, L sided abdominal pain). There is no rebound.  Musculoskeletal: Normal range of motion. He exhibits no edema.  Neurological: He is alert and oriented to person, place, and time.  Skin: No rash noted. He is not diaphoretic.    ED Course  Procedures (including critical care time) Labs Review Labs Reviewed  CBC - Abnormal; Notable for the following:    Hemoglobin 17.4 (*)    All other components within normal limits  COMPREHENSIVE METABOLIC PANEL  URINALYSIS, ROUTINE W REFLEX MICROSCOPIC    Imaging Review No results found.   EKG Interpretation None      MDM   Final diagnoses:  Flank pain  Nephrolithiasis    33 year old male here with left-sided flank pain. 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 Has had orchiectomy for his chronic flank pain. No fevers nausea or vomiting. States worse and time to time he requires IV pain medicines. He has outpatient doctor, Dr. Damita Dunnings, who provides by mouth pain medications had multiple kidney stones requiring stents, lithotripsy. We'll ultrasound his kidneys, check labs, give pain meds. Labs and [place] ok. Feeling better. Stable for discharge.  Osvaldo Shipper, MD 08/26/13 [phone]

## 2013-09-01 ENCOUNTER — Encounter (HOSPITAL_COMMUNITY): Payer: Self-pay | Admitting: Emergency Medicine

## 2013-09-01 ENCOUNTER — Emergency Department (HOSPITAL_COMMUNITY)
Admission: EM | Admit: 2013-09-01 | Discharge: 2013-09-02 | Disposition: A | Discharge status: Home / Self Care | Payer: BC Managed Care – PPO | Attending: Emergency Medicine | Admitting: Emergency Medicine

## 2013-09-01 ENCOUNTER — Emergency Department (HOSPITAL_COMMUNITY): Payer: BC Managed Care – PPO

## 2013-09-01 DIAGNOSIS — G40909 Epilepsy, unspecified, not intractable, without status epilepticus: Secondary | ICD-10-CM | POA: Insufficient documentation

## 2013-09-01 DIAGNOSIS — R109 Unspecified abdominal pain: Secondary | ICD-10-CM | POA: Insufficient documentation

## 2013-09-01 DIAGNOSIS — Z87442 Personal history of urinary calculi: Secondary | ICD-10-CM | POA: Insufficient documentation

## 2013-09-01 DIAGNOSIS — G8929 Other chronic pain: Secondary | ICD-10-CM | POA: Insufficient documentation

## 2013-09-01 DIAGNOSIS — K219 Gastro-esophageal reflux disease without esophagitis: Secondary | ICD-10-CM | POA: Insufficient documentation

## 2013-09-01 DIAGNOSIS — N489 Disorder of penis, unspecified: Secondary | ICD-10-CM | POA: Insufficient documentation

## 2013-09-01 DIAGNOSIS — F329 Major depressive disorder, single episode, unspecified: Secondary | ICD-10-CM | POA: Insufficient documentation

## 2013-09-01 DIAGNOSIS — M549 Dorsalgia, unspecified: Secondary | ICD-10-CM | POA: Insufficient documentation

## 2013-09-01 DIAGNOSIS — F3289 Other specified depressive episodes: Secondary | ICD-10-CM | POA: Insufficient documentation

## 2013-09-01 DIAGNOSIS — Z79899 Other long term (current) drug therapy: Secondary | ICD-10-CM | POA: Insufficient documentation

## 2013-09-01 DIAGNOSIS — N4889 Other specified disorders of penis: Secondary | ICD-10-CM

## 2013-09-01 DIAGNOSIS — G4700 Insomnia, unspecified: Secondary | ICD-10-CM | POA: Insufficient documentation

## 2013-09-01 LAB — I-STAT CHEM 8, ED
BUN: 14 mg/dL (ref 6–23)
Calcium, Ion: 1.15 mmol/L (ref 1.12–1.23)
Chloride: 105 mEq/L (ref 96–112)
Creatinine, Ser: 1 mg/dL (ref 0.50–1.35)
Glucose, Bld: 89 mg/dL (ref 70–99)
HCT: 49 % (ref 39.0–52.0)
Hemoglobin: 16.7 g/dL (ref 13.0–17.0)
Potassium: 4.3 mEq/L (ref 3.7–5.3)
Sodium: 141 mEq/L (ref 137–147)
TCO2: 27 mmol/L (ref 0–100)

## 2013-09-01 MED ORDER — ONDANSETRON HCL 4 MG/2ML IJ SOLN
4.0000 mg | Freq: Once | INTRAMUSCULAR | Status: AC
  Administered 2013-09-01: 4 mg via INTRAVENOUS
  Filled 2013-09-01: qty 2

## 2013-09-01 MED ORDER — HYDROMORPHONE HCL PF 1 MG/ML IJ SOLN
1.0000 mg | Freq: Once | INTRAMUSCULAR | Status: AC
  Administered 2013-09-02: 1 mg via INTRAVENOUS
  Filled 2013-09-01: qty 1

## 2013-09-01 MED ORDER — HYDROMORPHONE HCL PF 1 MG/ML IJ SOLN
1.0000 mg | Freq: Once | INTRAMUSCULAR | Status: AC
  Administered 2013-09-01: 1 mg via INTRAVENOUS
  Filled 2013-09-01: qty 1

## 2013-09-01 MED ORDER — ONDANSETRON HCL 4 MG/2ML IJ SOLN
4.0000 mg | Freq: Once | INTRAMUSCULAR | Status: AC
  Administered 2013-09-02: 4 mg via INTRAVENOUS
  Filled 2013-09-01: qty 2

## 2013-09-01 NOTE — ED Provider Notes (Signed)
CSN: [id number]     Arrival date & time 09/01/13  2122 History   First MD Initiated Contact with Patient 09/01/13 2149     Chief Complaint  Patient presents with  . Flank Pain     (Consider location/radiation/quality/duration/timing/severity/associated sxs/prior Treatment) The history is provided by the patient.    Pt with hx kidney stones, also with chronic groin pain in pain management, and s/p left orchiectomy for chronic epididymitis presents with left flank pain and penis pain.  States he has had the left flank pain at a lower level for days to weeks, was seen in ED recently for same.  States it became very intense in the past 2 hours.  Pain feels exactly like prior kidney stone pain.  Also feels "like someone is putting a catheter in my penis," also with dysuria and decreased urination.  Associated nausea, sweating, clamminess.  Has chronic groin pain and right testicular pain that are unchanged.  Denies fevers, denies change in his bowel movements (chronic hard stools and occasional BRBPR from fissures).  Is in pain management, takes Vicodin 10-325s for chronic pain - this is "not touching" the pain currently.   Past Medical History  Diagnosis Date  . GERD (gastroesophageal reflux disease)   . History of kidney stones   . Seizures Childhood  . Depression     and panic/anxiety  . Insomnia   . Kidney stones    Past Surgical History  Procedure Laterality Date  . Lithotripsy      four times over the years; most recent 10/08/12  . Appendectomy  2011  . Laparoscopic cholecystectomy  1996  . Tonsillectomy  1992  . Wrist ganglion excision  2002    Right  . Hydrocele excision Left 06/2012  . Orchiectomy Left 11/2012   Family History  Problem Relation Age of Onset  . Stroke Mother   . Liver disease Mother   . Irritable bowel syndrome Mother   . Kidney disease Mother   . Heart disease Father   . Colon polyps Father   . Colon cancer Neg Hx    History  Substance Use Topics  .  Smoking status: Never Smoker   . Smokeless tobacco: Never Used  . Alcohol Use: Yes     Comment: rarely    Review of Systems  Musculoskeletal: Positive for back pain (chronic, unchanged).  All other systems reviewed and are negative.     Allergies  Review of patient's allergies indicates no known allergies.  Home Medications   Prior to Admission medications   Medication Sig Start Date End Date Taking? Authorizing Provider  docusate sodium (COLACE) 100 MG capsule Take 100 mg by mouth 2 (two) times daily.    Yes Historical Provider, MD  escitalopram (LEXAPRO) 10 MG tablet Take 10 mg by mouth every morning.   Yes Historical Provider, MD  HYDROcodone-acetaminophen (NORCO) 10-325 MG per tablet Take 1.5 tablets by mouth every 6 (six) hours as needed for moderate pain or severe pain. 08/06/13  Yes Tonia Ghent, MD  ibuprofen (ADVIL,MOTRIN) 200 MG tablet Take 600-800 mg by mouth every 6 (six) hours as needed for mild pain or moderate pain.    Yes Historical Provider, MD  indapamide (LOZOL) 2.5 MG tablet Take 2.5 mg by mouth every morning.    Yes Historical Provider, MD  LamoTRIgine (LAMICTAL XR) 25 MG TB24 tablet Take 25-100 mg by mouth at bedtime. Take 1 tab daily for 2 weeks. Then take 2 tabs daily for 2 weeks.  Then take 4 tabs daily.   Yes Historical Provider, MD  ondansetron (ZOFRAN-ODT) 4 MG disintegrating tablet Take 4 mg by mouth every 4 (four) hours as needed for nausea or vomiting.   Yes Historical Provider, MD  pantoprazole (PROTONIX) 40 MG tablet Take 40 mg by mouth 2 (two) times daily.   Yes Historical Provider, MD  potassium chloride (K-DUR) 10 MEQ tablet Take 10 mEq by mouth 2 (two) times daily.   Yes Historical Provider, MD  zolpidem (AMBIEN) 10 MG tablet Take 1 tablet (10 mg total) by mouth at bedtime. 08/06/13  Yes Tonia Ghent, MD   BP 157/100  Pulse 82  Temp(Src) 98.6 F (37 C) (Oral)  Resp 24  SpO2 99% Physical Exam  Nursing note and vitals  reviewed. Constitutional: He appears well-developed and well-nourished. No distress.  HENT:  Head: Normocephalic and atraumatic.  Eyes: Conjunctivae are normal.  Neck: Neck supple.  Cardiovascular: Normal rate and regular rhythm.   Pulmonary/Chest: Effort normal and breath sounds normal. No respiratory distress. He has no wheezes. He has no rales.  Abdominal: Soft. He exhibits no distension and no mass. There is tenderness in the suprapubic area. There is no rebound, no guarding and no CVA tenderness.  Suprapubic tenderness and mild diffuse left sided abdominal and left flank tenderness.  No CVA tenderness. Obese    Genitourinary: Right testis shows tenderness (chronic, unchanged). Right testis shows no mass and no swelling. Right testis is descended. Circumcised. Penile tenderness present.  Neurological: He is alert. He exhibits normal muscle tone.  Skin: He is not diaphoretic.    ED Course  Procedures (including critical care time) Labs Review Labs Reviewed  URINALYSIS, ROUTINE W REFLEX MICROSCOPIC  I-STAT CHEM 8, ED    Imaging Review US Renal  09/02/2013   CLINICAL DATA:  Flank pain.  EXAM: RENAL/URINARY TRACT ULTRASOUND COMPLETE  COMPARISON:  CT of the abdomen and pelvis performed 07/29/2013, and renal ultrasound performed 08/26/2013  FINDINGS: Right Kidney:  Length: 12.7 cm. Echogenicity within normal limits. The previously described right-sided renal stone is not well seen. No mass or hydronephrosis visualized.  Left Kidney:  Length: 12.3 cm. Echogenicity within normal limits. One of the previously described left renal stones is noted, measuring 7 mm at the lower pole of the left kidney. No mass or hydronephrosis visualized.  Bladder:  Appears normal for degree of bladder distention. Bilateral ureteral jets are visualized.  IMPRESSION: No evidence of hydronephrosis; nonobstructing left renal stone again seen. Bilateral ureteral jets visualized.   Electronically Signed   By: Garald Balding M.D.   On: 09/02/2013 00:01     EKG Interpretation None      Per DEA database, patient has Vicodin 10-325 #120 on 08/07/13, same on 07/11/13 by Dr Damita Dunnings.   MDM   Final diagnoses:  Left flank pain  Penis pain    Afebrile nontoxic patient with hx kidney stones and chronic groin pain with acute worsening of left flank pain and new penis pain.  Flank pain identified by patient as identical to his ureteral colic pain.  UA negative Chem 8 unremarkable. IVF, pain and nausea medications given with some relief.  Pt is on pain contract and has norco 10-325 at home.  He also has a prescription for nausea medication and has flomax (has not been taking it).  I recommended using these, PCP/urology/pain management follow up.  Discussed result, findings, treatment, and follow up  with patient.  Pt given return precautions.  Pt verbalizes  understanding and agrees with plan.        [place], PA-C 09/02/13 [phone]

## 2013-09-01 NOTE — ED Notes (Signed)
Pt arrived to the ED with a complaint of bilateral flank pain with associated penis pain.  Pt has a hx of kidney stones and has been dx with same.

## 2013-09-02 LAB — URINALYSIS, ROUTINE W REFLEX MICROSCOPIC
Bilirubin Urine: NEGATIVE
Glucose, UA: NEGATIVE mg/dL
Hgb urine dipstick: NEGATIVE
Ketones, ur: NEGATIVE mg/dL
Leukocytes, UA: NEGATIVE
Nitrite: NEGATIVE
Protein, ur: NEGATIVE mg/dL
Specific Gravity, Urine: 1.025 (ref 1.005–1.030)
Urobilinogen, UA: 0.2 mg/dL (ref 0.0–1.0)
pH: 6 (ref 5.0–8.0)

## 2013-09-02 MED ORDER — PROMETHAZINE HCL 25 MG/ML IJ SOLN
25.0000 mg | Freq: Once | INTRAMUSCULAR | Status: AC
  Administered 2013-09-02: 25 mg via INTRAVENOUS
  Filled 2013-09-02: qty 1

## 2013-09-02 MED ORDER — KETOROLAC TROMETHAMINE 30 MG/ML IJ SOLN
30.0000 mg | Freq: Once | INTRAMUSCULAR | Status: AC
  Administered 2013-09-02: 30 mg via INTRAVENOUS
  Filled 2013-09-02: qty 1

## 2013-09-02 MED ORDER — FENTANYL CITRATE 0.05 MG/ML IJ SOLN
75.0000 ug | Freq: Once | INTRAMUSCULAR | Status: AC
  Administered 2013-09-02: 75 ug via INTRAVENOUS
  Filled 2013-09-02: qty 2

## 2013-09-02 NOTE — ED Provider Notes (Signed)
Medical screening examination/treatment/procedure(s) were performed by non-physician practitioner and as supervising physician I was immediately available for consultation/collaboration.   EKG Interpretation None        Wandra Arthurs, MD 09/02/13 [phone]

## 2013-09-02 NOTE — Discharge Instructions (Signed)
Read the information below.  You may return to the Emergency Department at any time for worsening condition or any new symptoms that concern you.  If you develop high fevers, worsening abdominal or flank pain, uncontrolled vomiting, or are unable to tolerate fluids by mouth, return to the ER for a recheck.     Flank Pain Flank pain refers to pain that is located on the side of the body between the upper abdomen and the back. The pain may occur over a short period of time (acute) or may be long-term or reoccurring (chronic). It may be mild or severe. Flank pain can be caused by many things. CAUSES  Some of the more common causes of flank pain include:  Muscle strains.   Muscle spasms.   A disease of your spine (vertebral disk disease).   A lung infection (pneumonia).   Fluid around your lungs (pulmonary edema).   A kidney infection.   Kidney stones.   A very painful skin rash caused by the chickenpox virus (shingles).   Gallbladder disease.  [place] care will depend on the cause of your pain. In general,  Rest as directed by your caregiver.  Drink enough fluids to keep your urine clear or pale yellow.  Only take over-the-counter or prescription medicines as directed by your caregiver. Some medicines may help relieve the pain.  Tell your caregiver about any changes in your pain.  Follow up with your caregiver as directed. SEEK IMMEDIATE MEDICAL CARE IF:   Your pain is not controlled with medicine.   You have new or worsening symptoms.  Your pain increases.   You have abdominal pain.   You have shortness of breath.   You have persistent nausea or vomiting.   You have swelling in your abdomen.   You feel faint or pass out.   You have blood in your urine.  You have a fever or persistent symptoms for more than 2-3 days.  You have a fever and your symptoms suddenly get worse. MAKE SURE YOU:   Understand these  instructions.  Will watch your condition.  Will get help right away if you are not doing well or get worse. Document Released: 03/03/2005 Document Revised: 10/05/2011 Document Reviewed: 08/25/2011 [facility] Patient Information 2015 [place], [place]. This information is not intended to replace advice given to you by your health care provider. Make sure you discuss any questions you have with your health care provider.

## 2013-09-03 ENCOUNTER — Emergency Department (HOSPITAL_COMMUNITY): Payer: BC Managed Care – PPO

## 2013-09-03 ENCOUNTER — Telehealth: Payer: Self-pay | Admitting: Neurology

## 2013-09-03 ENCOUNTER — Emergency Department (HOSPITAL_COMMUNITY)
Admission: EM | Admit: 2013-09-03 | Discharge: 2013-09-03 | Disposition: A | Discharge status: Home / Self Care | Payer: BC Managed Care – PPO | Attending: Emergency Medicine | Admitting: Emergency Medicine

## 2013-09-03 ENCOUNTER — Encounter (HOSPITAL_COMMUNITY): Payer: Self-pay | Admitting: Emergency Medicine

## 2013-09-03 ENCOUNTER — Ambulatory Visit: Payer: BC Managed Care – PPO | Admitting: Neurology

## 2013-09-03 DIAGNOSIS — G8929 Other chronic pain: Secondary | ICD-10-CM | POA: Diagnosis not present

## 2013-09-03 DIAGNOSIS — N200 Calculus of kidney: Secondary | ICD-10-CM | POA: Insufficient documentation

## 2013-09-03 DIAGNOSIS — Z79899 Other long term (current) drug therapy: Secondary | ICD-10-CM | POA: Insufficient documentation

## 2013-09-03 DIAGNOSIS — R109 Unspecified abdominal pain: Secondary | ICD-10-CM | POA: Insufficient documentation

## 2013-09-03 DIAGNOSIS — F329 Major depressive disorder, single episode, unspecified: Secondary | ICD-10-CM | POA: Insufficient documentation

## 2013-09-03 DIAGNOSIS — G40909 Epilepsy, unspecified, not intractable, without status epilepticus: Secondary | ICD-10-CM | POA: Insufficient documentation

## 2013-09-03 DIAGNOSIS — F3289 Other specified depressive episodes: Secondary | ICD-10-CM | POA: Insufficient documentation

## 2013-09-03 DIAGNOSIS — K219 Gastro-esophageal reflux disease without esophagitis: Secondary | ICD-10-CM | POA: Diagnosis not present

## 2013-09-03 DIAGNOSIS — G4700 Insomnia, unspecified: Secondary | ICD-10-CM | POA: Insufficient documentation

## 2013-09-03 LAB — URINALYSIS, ROUTINE W REFLEX MICROSCOPIC
Bilirubin Urine: NEGATIVE
Glucose, UA: NEGATIVE mg/dL
Hgb urine dipstick: NEGATIVE
Ketones, ur: NEGATIVE mg/dL
Leukocytes, UA: NEGATIVE
Nitrite: NEGATIVE
Protein, ur: NEGATIVE mg/dL
Specific Gravity, Urine: 1.02 (ref 1.005–1.030)
Urobilinogen, UA: 0.2 mg/dL (ref 0.0–1.0)
pH: 8.5 — ABNORMAL HIGH (ref 5.0–8.0)

## 2013-09-03 LAB — CBC WITH DIFFERENTIAL/PLATELET
Basophils Absolute: 0 10*3/uL (ref 0.0–0.1)
Basophils Relative: 0 % (ref 0–1)
Eosinophils Absolute: 0.2 10*3/uL (ref 0.0–0.7)
Eosinophils Relative: 2 % (ref 0–5)
HCT: 48.6 % (ref 39.0–52.0)
Hemoglobin: 17.2 g/dL — ABNORMAL HIGH (ref 13.0–17.0)
Lymphocytes Relative: 23 % (ref 12–46)
Lymphs Abs: 2.6 10*3/uL (ref 0.7–4.0)
MCH: 30.6 pg (ref 26.0–34.0)
MCHC: 35.4 g/dL (ref 30.0–36.0)
MCV: 86.5 fL (ref 78.0–100.0)
Monocytes Absolute: 1 10*3/uL (ref 0.1–1.0)
Monocytes Relative: 9 % (ref 3–12)
Neutro Abs: 7.6 10*3/uL (ref 1.7–7.7)
Neutrophils Relative %: 66 % (ref 43–77)
Platelets: 328 10*3/uL (ref 150–400)
RBC: 5.62 MIL/uL (ref 4.22–5.81)
RDW: 13.5 % (ref 11.5–15.5)
WBC: 11.4 10*3/uL — ABNORMAL HIGH (ref 4.0–10.5)

## 2013-09-03 LAB — BASIC METABOLIC PANEL
Anion gap: 15 (ref 5–15)
BUN: 11 mg/dL (ref 6–23)
CO2: 25 mEq/L (ref 19–32)
Calcium: 10.2 mg/dL (ref 8.4–10.5)
Chloride: 103 mEq/L (ref 96–112)
Creatinine, Ser: 0.9 mg/dL (ref 0.50–1.35)
GFR calc Af Amer: >90 mL/min (ref >90)
GFR calc non Af Amer: >90 mL/min (ref >90)
Glucose, Bld: 89 mg/dL (ref 70–99)
Potassium: 4.2 mEq/L (ref 3.7–5.3)
Sodium: 143 mEq/L (ref 137–147)

## 2013-09-03 MED ORDER — ONDANSETRON HCL 4 MG/2ML IJ SOLN
4.0000 mg | Freq: Once | INTRAMUSCULAR | Status: AC
  Administered 2013-09-03: 4 mg via INTRAVENOUS
  Filled 2013-09-03: qty 2

## 2013-09-03 MED ORDER — SODIUM CHLORIDE 0.9 % IV BOLUS (SEPSIS)
1000.0000 mL | Freq: Once | INTRAVENOUS | Status: AC
  Administered 2013-09-03: 1000 mL via INTRAVENOUS

## 2013-09-03 MED ORDER — HYDROMORPHONE HCL PF 2 MG/ML IJ SOLN
2.0000 mg | Freq: Once | INTRAMUSCULAR | Status: AC
  Administered 2013-09-03: 2 mg via INTRAVENOUS
  Filled 2013-09-03: qty 1

## 2013-09-03 MED ORDER — HYDROMORPHONE HCL PF 1 MG/ML IJ SOLN
1.0000 mg | Freq: Once | INTRAMUSCULAR | Status: AC
Start: 2013-09-03 — End: 2013-09-03
  Administered 2013-09-03: 1 mg via INTRAVENOUS
  Filled 2013-09-03: qty 1

## 2013-09-03 NOTE — Telephone Encounter (Signed)
Pt no showed 09/03/13 appt w/ Dr. Delice Lesch. No show letter mailed / Gayleen Orem.

## 2013-09-03 NOTE — ED Notes (Signed)
Pt states that about an hour ago pt started having severe pain even after taking pain meds. Pt states that he did pass a stone earlier today.

## 2013-09-03 NOTE — ED Provider Notes (Signed)
CSN: [id number]     Arrival date & time 09/03/13  1452 History   First MD Initiated Contact with Patient 09/03/13 1525     Chief Complaint  Patient presents with  . Flank Pain    left   . Groin Pain    left     (Consider location/radiation/quality/duration/timing/severity/associated sxs/prior Treatment) Patient is a 33 y.o. male presenting with abdominal pain.  Abdominal Pain Pain location:  L flank Pain quality: aching and cramping   Pain radiates to:  Groin Pain severity:  Severe Onset quality:  Gradual Duration:  1 day Timing:  Constant Progression:  Worsening Chronicity:  Recurrent Context: recent illness (known L nephrolithiasis)   Relieved by:  Nothing Worsened by:  Nothing tried Ineffective treatments:  None tried Associated symptoms: no chest pain, no chills, no constipation, no cough, no diarrhea, no dysuria, no fever, no hematuria, no nausea, no shortness of breath, no sore throat and no vomiting     Past Medical History  Diagnosis Date  . GERD (gastroesophageal reflux disease)   . History of kidney stones   . Seizures Childhood  . Depression     and panic/anxiety  . Insomnia   . Kidney stones    Past Surgical History  Procedure Laterality Date  . Lithotripsy      four times over the years; most recent 10/08/12  . Appendectomy  2011  . Laparoscopic cholecystectomy  1996  . Tonsillectomy  1992  . Wrist ganglion excision  2002    Right  . Hydrocele excision Left 06/2012  . Orchiectomy Left 11/2012   Family History  Problem Relation Age of Onset  . Stroke Mother   . Liver disease Mother   . Irritable bowel syndrome Mother   . Kidney disease Mother   . Heart disease Father   . Colon polyps Father   . Colon cancer Neg Hx    History  Substance Use Topics  . Smoking status: Never Smoker   . Smokeless tobacco: Never Used  . Alcohol Use: Yes     Comment: rarely    Review of Systems  Constitutional: Negative for fever and chills.  HENT: Negative  for congestion, rhinorrhea and sore throat.   Eyes: Negative for photophobia and visual disturbance.  Respiratory: Negative for cough and shortness of breath.   Cardiovascular: Negative for chest pain and leg swelling.  Gastrointestinal: Negative for nausea, vomiting, abdominal pain, diarrhea and constipation.  Endocrine: Negative for polydipsia and polyuria.  Genitourinary: Negative for dysuria and hematuria.  Musculoskeletal: Negative for arthralgias and back pain.  Skin: Negative for color change and rash.  Neurological: Negative for dizziness, syncope, light-headedness and headaches.  Hematological: Negative for adenopathy. Does not bruise/bleed easily.  All other systems reviewed and are negative.     Allergies  Review of patient's allergies indicates no known allergies.  Home Medications   Prior to Admission medications   Medication Sig Start Date End Date Taking? Authorizing Provider  docusate sodium (COLACE) 100 MG capsule Take 100 mg by mouth 2 (two) times daily.    Yes Historical Provider, MD  escitalopram (LEXAPRO) 10 MG tablet Take 10 mg by mouth every morning.   Yes Historical Provider, MD  ibuprofen (ADVIL,MOTRIN) 200 MG tablet Take 600-800 mg by mouth every 6 (six) hours as needed for mild pain or moderate pain.    Yes Historical Provider, MD  indapamide (LOZOL) 2.5 MG tablet Take 2.5 mg by mouth every morning.    Yes Historical Provider, MD  LamoTRIgine (LAMICTAL XR) 25 MG TB24 tablet Take 25-100 mg by mouth at bedtime. Take 1 tab daily for 2 weeks. Then take 2 tabs daily for 2 weeks. Then take 4 tabs daily.   Yes Historical Provider, MD  ondansetron (ZOFRAN-ODT) 4 MG disintegrating tablet Take 4 mg by mouth every 4 (four) hours as needed for nausea or vomiting.   Yes Historical Provider, MD  pantoprazole (PROTONIX) 40 MG tablet Take 40 mg by mouth 2 (two) times daily.   Yes Historical Provider, MD  potassium chloride (K-DUR) 10 MEQ tablet Take 10 mEq by mouth 2 (two)  times daily.   Yes Historical Provider, MD  zolpidem (AMBIEN) 10 MG tablet Take 1 tablet (10 mg total) by mouth at bedtime. 08/06/13  Yes Tonia Ghent, MD  HYDROcodone-acetaminophen [facility]) 10-325 MG per tablet Take 1.5 tablets by mouth every 6 (six) hours as needed for moderate pain or severe pain. 09/04/13   Tonia Ghent, MD   BP 117/68  Pulse 67  Temp(Src) 97.4 F (36.3 C) (Oral)  Resp 18  SpO2 96% Physical Exam  Vitals reviewed. Constitutional: He is oriented to person, place, and time. He appears well-developed and well-nourished.  HENT:  Head: Normocephalic and atraumatic.  Eyes: Conjunctivae and EOM are normal.  Neck: Normal range of motion. Neck supple.  Cardiovascular: Normal rate, regular rhythm and normal heart sounds.   Pulmonary/Chest: Effort normal and breath sounds normal. No respiratory distress.  Abdominal: He exhibits no distension. There is no tenderness. There is CVA tenderness (L). There is no rebound and no guarding.  Genitourinary:  Surgically absent L testicle, R tender, no obvious abnormality  Musculoskeletal: Normal range of motion.  Neurological: He is alert and oriented to person, place, and time.  Skin: Skin is warm and dry.    ED Course  Procedures (including critical care time) Labs Review Labs Reviewed  URINALYSIS, ROUTINE W REFLEX MICROSCOPIC - Abnormal; Notable for the following:    pH 8.5 (*)    All other components within normal limits  CBC WITH DIFFERENTIAL - Abnormal; Notable for the following:    WBC 11.4 (*)    Hemoglobin 17.2 (*)    All other components within normal limits  URINE CULTURE  BASIC METABOLIC PANEL   Results for orders placed during the hospital encounter of 09/03/13  URINALYSIS, ROUTINE W REFLEX MICROSCOPIC      Result Value Ref Range   Color, Urine YELLOW  YELLOW   APPearance CLEAR  CLEAR   Specific Gravity, Urine 1.020  1.005 - 1.030   pH 8.5 (*) 5.0 - 8.0   Glucose, UA NEGATIVE  NEGATIVE mg/dL   Hgb urine  dipstick NEGATIVE  NEGATIVE   Bilirubin Urine NEGATIVE  NEGATIVE   Ketones, ur NEGATIVE  NEGATIVE mg/dL   Protein, ur NEGATIVE  NEGATIVE mg/dL   Urobilinogen, UA 0.2  0.0 - 1.0 mg/dL   Nitrite NEGATIVE  NEGATIVE   Leukocytes, UA NEGATIVE  NEGATIVE  CBC WITH DIFFERENTIAL      Result Value Ref Range   WBC 11.4 (*) 4.0 - 10.5 K/uL   RBC 5.62  4.22 - 5.81 MIL/uL   Hemoglobin 17.2 (*) 13.0 - 17.0 g/dL   HCT 48.6  39.0 - 52.0 %   MCV 86.5  78.0 - 100.0 fL   MCH 30.6  26.0 - 34.0 pg   MCHC 35.4  30.0 - 36.0 g/dL   RDW 13.5  11.5 - 15.5 %   Platelets 328  150 -  400 K/uL   Neutrophils Relative % 66  43 - 77 %   Neutro Abs 7.6  1.7 - 7.7 K/uL   Lymphocytes Relative 23  12 - 46 %   Lymphs Abs 2.6  0.7 - 4.0 K/uL   Monocytes Relative 9  3 - 12 %   Monocytes Absolute 1.0  0.1 - 1.0 K/uL   Eosinophils Relative 2  0 - 5 %   Eosinophils Absolute 0.2  0.0 - 0.7 K/uL   Basophils Relative 0  0 - 1 %   Basophils Absolute 0.0  0.0 - 0.1 K/uL  BASIC METABOLIC PANEL      Result Value Ref Range   Sodium 143  137 - 147 mEq/L   Potassium 4.2  3.7 - 5.3 mEq/L   Chloride 103  96 - 112 mEq/L   CO2 25  19 - 32 mEq/L   Glucose, Bld 89  70 - 99 mg/dL   BUN 11  6 - 23 mg/dL   Creatinine, Ser 0.90  0.50 - 1.35 mg/dL   Calcium 10.2  8.4 - 10.5 mg/dL   GFR calc non Af Amer >90  >90 mL/min   GFR calc Af Amer >90  >90 mL/min   Anion gap 15  5 - 15     Imaging Review US Scrotum  09/03/2013   CLINICAL DATA:  Left flank and groin pain. Question torsion. Patient is status post left testicle removal for chronic epididymitis.  EXAM: ULTRASOUND OF SCROTUM  TECHNIQUE: Complete ultrasound examination of the testicles, epididymis, and other scrotal structures was performed.  COMPARISON:  None.  FINDINGS: Right testicle  Measurements: 4.9 x 2.7 x 3.2 cm. No mass or microlithiasis visualized. No evidence of torsion.  Left testicle  Surgically absent.  Right epididymis:  Normal in size and appearance.  Left epididymis:   Surgically absent.  Hydrocele:  None visualized.  Varicocele:  None a small 3.7 mm right varicocele noted.  IMPRESSION: 1. Normal ultrasound of the right testicle. No evidence of testicular torsion. 2. Left testicle is surgically absent. No abnormality within the left scrotal sac. 3. Small right varicocele.   Electronically Signed   By: Jeannine Boga M.D.   On: 09/03/2013 19:41   US Renal  09/03/2013   CLINICAL DATA:  Flank pain  EXAM: RENAL/URINARY TRACT ULTRASOUND COMPLETE  COMPARISON:  September 01, 2013  FINDINGS: Right Kidney:  Length: 13.0 cm. Echogenicity and renal cortical thickness are within normal limits. No mass, perinephric fluid, or hydronephrosis visualized. There is an 8 mm echogenic focus in the mid right kidney, suspicious for a nonobstructing calculus. No ureterectasis.  Left Kidney:  Length: 13.3 cm. Echogenicity and renal cortical thickness are within normal limits. No mass, perinephric fluid, or hydronephrosis visualized. There is an 8 mm echogenic focus in the mid left kidney as well as an 8 mm focus in the lower pole left kidney. These areas are felt to most likely represent calculi. No ureterectasis.  Bladder:  Appears normal for degree of bladder distention.  IMPRESSION: Nonobstructing renal calculi bilaterally. No hydronephrosis. No mass. Study otherwise unremarkable.   Electronically Signed   By: Lowella Grip M.D.   On: 09/03/2013 19:40   [place] Art/ven Flow Abd Pelv Doppler  09/03/2013   CLINICAL DATA:  Left flank and groin pain. Question torsion. Patient is status post left testicle removal for chronic epididymitis.  EXAM: ULTRASOUND OF SCROTUM  TECHNIQUE: Complete ultrasound examination of the testicles, epididymis, and other scrotal structures was performed.  COMPARISON:  None.  FINDINGS: Right testicle  Measurements: 4.9 x 2.7 x 3.2 cm. No mass or microlithiasis visualized. No evidence of torsion.  Left testicle  Surgically absent.  Right epididymis:  Normal in size and  appearance.  Left epididymis:  Surgically absent.  Hydrocele:  None visualized.  Varicocele:  None a small 3.7 mm right varicocele noted.  IMPRESSION: 1. Normal ultrasound of the right testicle. No evidence of testicular torsion. 2. Left testicle is surgically absent. No abnormality within the left scrotal sac. 3. Small right varicocele.   Electronically Signed   By: Jeannine Boga M.D.   On: 09/03/2013 19:41     EKG Interpretation None      MDM   Final diagnoses:  Nephrolithiasis  Chronic abdominal pain    33 y.o. male  with pertinent PMH of L 33mm nephrolithiasis presents with recurrent L flank pain.  No fevers, and nature of pain is chronic, however acutely worse over 24 hours.  He has already scheduled urology fu after stones were discovered.  No penile dc, and pt has been nausous, but not vomiting.  Additionally, he states he passed one stone earlier today.  Physical exam and vitals on arrival as above.  US obtained and without obstruction, redemonstrated 2 61mm stones in L renal pelvis.  Labs unremarkable.  Pt stable to dc home with urology fu at this time.  Given standard return precautions.    Labs and imaging as above reviewed.   1. Nephrolithiasis   2. Chronic abdominal pain         Debby Freiberg, MD 09/04/13 1419

## 2013-09-03 NOTE — ED Notes (Signed)
Strainer given. Knows to follow up with urology for surgical intervention. No other questions/concerns. Patient is not driving home.

## 2013-09-03 NOTE — ED Notes (Signed)
Initial contact-pt A&Ox4. Ambulatory and moving all extremities. In apparent distress. Hx chronic kidney stones. Currently not taking Flomax d/t attempt to have child. C/o 7/10 pain left flank radiating to groin. Has had 15 mg total Hydrocodone today. Awaiting MD.

## 2013-09-03 NOTE — ED Notes (Signed)
Ultrasound at bedside

## 2013-09-03 NOTE — Discharge Instructions (Signed)
Abdominal Pain °Many things can cause abdominal pain. Usually, abdominal pain is not caused by a disease and will improve without treatment. It can often be observed and treated at home. Your health care provider will do a physical exam and possibly order blood tests and X-rays to help determine the seriousness of your pain. However, in many cases, more time must pass before a clear cause of the pain can be found. Before that point, your health care provider may not know if you need more testing or further treatment. °HOME CARE INSTRUCTIONS  °Monitor your abdominal pain for any changes. The following actions may help to alleviate any discomfort you are experiencing: °· Only take over-the-counter or prescription medicines as directed by your health care provider. °· Do not take laxatives unless directed to do so by your health care provider. °· Try a clear liquid diet (broth, tea, or water) as directed by your health care provider. Slowly move to a bland diet as tolerated. °SEEK MEDICAL CARE IF: °· You have unexplained abdominal pain. °· You have abdominal pain associated with nausea or diarrhea. °· You have pain when you urinate or have a bowel movement. °· You experience abdominal pain that wakes you in the night. °· You have abdominal pain that is worsened or improved by eating food. °· You have abdominal pain that is worsened with eating fatty foods. °· You have a fever. °SEEK IMMEDIATE MEDICAL CARE IF:  °· Your pain does not go away within 2 hours. °· You keep throwing up (vomiting). °· Your pain is felt only in portions of the abdomen, such as the right side or the left lower portion of the abdomen. °· You pass bloody or black tarry stools. °MAKE SURE YOU: °· Understand these instructions.   °· Will watch your condition.   °· Will get help right away if you are not doing well or get worse.   °Document Released: 10/20/2004 Document Revised: 01/15/2013 Document Reviewed: 09/19/2012 °Exit[employer]® Patient Information  ©2015 [employer]. This information is not intended to replace advice given to you by your health care provider. Make sure you discuss any questions you have with your health care provider. ° ° ° °Ureteral Colic (Kidney Stones) °Ureteral colic is the result of a condition when kidney stones form inside the kidney. Once kidney stones are formed they may move into the tube that connects the kidney with the bladder (ureter). If this occurs, this condition may cause pain (colic) in the ureter.  °CAUSES  °Pain is caused by stone movement in the ureter and the obstruction caused by the stone. °SYMPTOMS  °The pain comes and goes as the ureter contracts around the stone. The pain is usually intense, sharp, and stabbing in character. The location of the pain may move as the stone moves through the ureter. When the stone is near the kidney the pain is usually located in the back and radiates to the belly (abdomen). When the stone is ready to pass into the bladder the pain is often located in the lower abdomen on the side the stone is located. At this location, the symptoms may mimic those of a urinary tract infection with urinary frequency. Once the stone is located here it often passes into the bladder and the pain disappears completely. °TREATMENT  °· Your caregiver will provide you with medicine for pain relief. °· You may require specialized follow-up X-rays. °· The absence of pain does not always mean that the stone has passed. It may have just stopped moving.   If the urine remains completely obstructed, it can cause loss of kidney function or even complete destruction of the involved kidney. It is your responsibility and in your interest that X-rays and follow-ups as suggested by your caregiver are completed. Relief of pain without passage of the stone can be associated with severe damage to the kidney, including loss of kidney function on that side. °· If your stone does not pass on its own, additional measures may be  taken by your caregiver to ensure its removal. °HOME CARE INSTRUCTIONS  °· Increase your fluid intake. Water is the preferred fluid since juices containing vitamin C may acidify the urine making it less likely for certain stones (uric acid stones) to pass. °· Strain all urine. A strainer will be provided. Keep all particulate matter or stones for your caregiver to inspect. °· Take your pain medicine as directed. °· Make a follow-up appointment with your caregiver as directed. °· Remember that the goal is passage of your stone. The absence of pain does not mean the stone is gone. Follow your caregiver's instructions. °· Only take over-the-counter or prescription medicines for pain, discomfort, or fever as directed by your caregiver. °SEEK MEDICAL CARE IF:  °· Pain cannot be controlled with the prescribed medicine. °· You have a fever. °· Pain continues for longer than your caregiver advises it should. °· There is a change in the pain, and you develop chest discomfort or constant abdominal pain. °· You feel faint or pass out. °MAKE SURE YOU:  °· Understand these instructions. °· Will watch your condition. °· Will get help right away if you are not doing well or get worse. °Document Released: 10/20/2004 Document Revised: 05/07/2012 Document Reviewed: 07/07/2010 °ExitCare® Patient Information ©2015 [employer]. This information is not intended to replace advice given to you by your health care provider. Make sure you discuss any questions you have with your health care provider. ° °

## 2013-09-04 ENCOUNTER — Encounter: Payer: Self-pay | Admitting: Family Medicine

## 2013-09-04 ENCOUNTER — Other Ambulatory Visit: Payer: Self-pay | Admitting: Family Medicine

## 2013-09-04 ENCOUNTER — Ambulatory Visit: Payer: BC Managed Care – PPO | Admitting: Family Medicine

## 2013-09-04 LAB — URINE CULTURE
Colony Count: NO GROWTH
Culture: NO GROWTH

## 2013-09-04 MED ORDER — HYDROCODONE-ACETAMINOPHEN 10-325 MG PO TABS: 1.5000 | ORAL_TABLET | Freq: Four times a day (QID) | ORAL | Status: DC | PRN

## 2013-09-05 ENCOUNTER — Other Ambulatory Visit: Payer: Self-pay | Admitting: Family Medicine

## 2013-09-05 ENCOUNTER — Ambulatory Visit: Payer: BC Managed Care – PPO | Admitting: Family Medicine

## 2013-09-05 MED ORDER — ESCITALOPRAM OXALATE 20 MG PO TABS: 20.0000 mg | ORAL_TABLET | Freq: Every day | ORAL | Status: DC

## 2013-09-10 ENCOUNTER — Encounter (HOSPITAL_COMMUNITY): Payer: Self-pay | Admitting: Emergency Medicine

## 2013-09-10 ENCOUNTER — Emergency Department (HOSPITAL_COMMUNITY)
Admission: EM | Admit: 2013-09-10 | Discharge: 2013-09-10 | Disposition: A | Discharge status: Home / Self Care | Payer: BC Managed Care – PPO | Attending: Emergency Medicine | Admitting: Emergency Medicine

## 2013-09-10 ENCOUNTER — Emergency Department (HOSPITAL_COMMUNITY): Payer: BC Managed Care – PPO

## 2013-09-10 DIAGNOSIS — F3289 Other specified depressive episodes: Secondary | ICD-10-CM | POA: Insufficient documentation

## 2013-09-10 DIAGNOSIS — K219 Gastro-esophageal reflux disease without esophagitis: Secondary | ICD-10-CM | POA: Diagnosis not present

## 2013-09-10 DIAGNOSIS — G8929 Other chronic pain: Secondary | ICD-10-CM | POA: Insufficient documentation

## 2013-09-10 DIAGNOSIS — R1032 Left lower quadrant pain: Secondary | ICD-10-CM

## 2013-09-10 DIAGNOSIS — N200 Calculus of kidney: Secondary | ICD-10-CM | POA: Insufficient documentation

## 2013-09-10 DIAGNOSIS — R109 Unspecified abdominal pain: Secondary | ICD-10-CM | POA: Insufficient documentation

## 2013-09-10 DIAGNOSIS — Z9089 Acquired absence of other organs: Secondary | ICD-10-CM | POA: Insufficient documentation

## 2013-09-10 DIAGNOSIS — Z79899 Other long term (current) drug therapy: Secondary | ICD-10-CM | POA: Diagnosis not present

## 2013-09-10 DIAGNOSIS — G4700 Insomnia, unspecified: Secondary | ICD-10-CM | POA: Diagnosis not present

## 2013-09-10 DIAGNOSIS — F329 Major depressive disorder, single episode, unspecified: Secondary | ICD-10-CM | POA: Insufficient documentation

## 2013-09-10 LAB — URINALYSIS, ROUTINE W REFLEX MICROSCOPIC
Bilirubin Urine: NEGATIVE
Glucose, UA: NEGATIVE mg/dL
Hgb urine dipstick: NEGATIVE
Ketones, ur: NEGATIVE mg/dL
Leukocytes, UA: NEGATIVE
Nitrite: NEGATIVE
Protein, ur: NEGATIVE mg/dL
Specific Gravity, Urine: 1.022 (ref 1.005–1.030)
Urobilinogen, UA: 1 mg/dL (ref 0.0–1.0)
pH: 5.5 (ref 5.0–8.0)

## 2013-09-10 LAB — BASIC METABOLIC PANEL
Anion gap: 13 (ref 5–15)
BUN: 11 mg/dL (ref 6–23)
CO2: 30 mEq/L (ref 19–32)
Calcium: 10 mg/dL (ref 8.4–10.5)
Chloride: 98 mEq/L (ref 96–112)
Creatinine, Ser: 1.01 mg/dL (ref 0.50–1.35)
GFR calc Af Amer: >90 mL/min (ref >90)
GFR calc non Af Amer: >90 mL/min (ref >90)
Glucose, Bld: 97 mg/dL (ref 70–99)
Potassium: 4.2 mEq/L (ref 3.7–5.3)
Sodium: 141 mEq/L (ref 137–147)

## 2013-09-10 LAB — CBC
HCT: 49.4 % (ref 39.0–52.0)
Hemoglobin: 17.6 g/dL — ABNORMAL HIGH (ref 13.0–17.0)
MCH: 31 pg (ref 26.0–34.0)
MCHC: 35.6 g/dL (ref 30.0–36.0)
MCV: 87 fL (ref 78.0–100.0)
Platelets: 317 10*3/uL (ref 150–400)
RBC: 5.68 MIL/uL (ref 4.22–5.81)
RDW: 13.5 % (ref 11.5–15.5)
WBC: 11.6 10*3/uL — ABNORMAL HIGH (ref 4.0–10.5)

## 2013-09-10 MED ORDER — HYDROMORPHONE HCL PF 1 MG/ML IJ SOLN
1.0000 mg | Freq: Once | INTRAMUSCULAR | Status: AC
  Administered 2013-09-10: 1 mg via INTRAVENOUS
  Filled 2013-09-10: qty 1

## 2013-09-10 MED ORDER — SODIUM CHLORIDE 0.9 % IV BOLUS (SEPSIS)
1000.0000 mL | Freq: Once | INTRAVENOUS | Status: AC
  Administered 2013-09-10: 1000 mL via INTRAVENOUS

## 2013-09-10 MED ORDER — ONDANSETRON HCL 4 MG/2ML IJ SOLN
4.0000 mg | Freq: Once | INTRAMUSCULAR | Status: AC
  Administered 2013-09-10: 4 mg via INTRAVENOUS
  Filled 2013-09-10: qty 2

## 2013-09-10 NOTE — Discharge Instructions (Signed)

## 2013-09-10 NOTE — ED Provider Notes (Signed)
CSN: [id number]     Arrival date & time 09/10/13  2029 History   First MD Initiated Contact with Patient 09/10/13 2044     Chief Complaint  Patient presents with  . Nephrolithiasis     (Consider location/radiation/quality/duration/timing/severity/associated sxs/prior Treatment) Patient is a 33 y.o. male presenting with abdominal pain. The history is provided by the patient.  Abdominal Pain Pain location:  L flank Pain quality: sharp   Pain radiates to:  Groin Pain severity:  Severe Onset quality:  Sudden Timing:  Constant Progression:  Worsening Chronicity:  Chronic Context: not eating, not recent illness and not sick contacts   Relieved by:  Nothing Worsened by:  Nothing tried Associated symptoms: no chest pain, no cough, no fever and no shortness of breath     Past Medical History  Diagnosis Date  . GERD (gastroesophageal reflux disease)   . History of kidney stones   . Seizures Childhood  . Depression     and panic/anxiety  . Insomnia   . Kidney stones    Past Surgical History  Procedure Laterality Date  . Lithotripsy      four times over the years; most recent 10/08/12  . Appendectomy  2011  . Laparoscopic cholecystectomy  1996  . Tonsillectomy  1992  . Wrist ganglion excision  2002    Right  . Hydrocele excision Left 06/2012  . Orchiectomy Left 11/2012   Family History  Problem Relation Age of Onset  . Stroke Mother   . Liver disease Mother   . Irritable bowel syndrome Mother   . Kidney disease Mother   . Heart disease Father   . Colon polyps Father   . Colon cancer Neg Hx    History  Substance Use Topics  . Smoking status: Never Smoker   . Smokeless tobacco: Never Used  . Alcohol Use: Yes     Comment: rarely    Review of Systems  Constitutional: Negative for fever.  Respiratory: Negative for cough and shortness of breath.   Cardiovascular: Negative for chest pain and leg swelling.  Gastrointestinal: Positive for abdominal pain.  All other  systems reviewed and are negative.     Allergies  Review of patient's allergies indicates no known allergies.  Home Medications   Prior to Admission medications   Medication Sig Start Date End Date Taking? Authorizing Provider  docusate sodium (COLACE) 100 MG capsule Take 100 mg by mouth 2 (two) times daily.    Yes Historical Provider, MD  escitalopram (LEXAPRO) 20 MG tablet Take 1 tablet (20 mg total) by mouth daily. 09/05/13  Yes Tonia Ghent, MD  HYDROcodone-acetaminophen [facility]) 10-325 MG per tablet Take 1.5 tablets by mouth every 6 (six) hours as needed for moderate pain or severe pain. 09/04/13  Yes Tonia Ghent, MD  ibuprofen (ADVIL,MOTRIN) 200 MG tablet Take 600-800 mg by mouth every 6 (six) hours as needed for mild pain or moderate pain.    Yes Historical Provider, MD  indapamide (LOZOL) 2.5 MG tablet Take 2.5 mg by mouth every morning.    Yes Historical Provider, MD  ondansetron (ZOFRAN-ODT) 4 MG disintegrating tablet Take 4 mg by mouth every 4 (four) hours as needed for nausea or vomiting.   Yes Historical Provider, MD  pantoprazole (PROTONIX) 40 MG tablet Take 40 mg by mouth 2 (two) times daily.   Yes Historical Provider, MD  potassium chloride (K-DUR) 10 MEQ tablet Take 10 mEq by mouth 2 (two) times daily.   Yes Historical Provider,  MD  zolpidem (AMBIEN) 10 MG tablet Take 1 tablet (10 mg total) by mouth at bedtime. 08/06/13  Yes Tonia Ghent, MD   BP 162/114  Pulse 110  Temp(Src) 98.1 F (36.7 C) (Oral)  Resp 24  SpO2 96% Physical Exam  Nursing note and vitals reviewed. Constitutional: He is oriented to person, place, and time. He appears well-developed and well-nourished. No distress.  HENT:  Head: Normocephalic and atraumatic.  Mouth/Throat: Oropharynx is clear and moist. No oropharyngeal exudate.  Eyes: EOM are normal. Pupils are equal, round, and reactive to light.  Neck: Normal range of motion. Neck supple.  Cardiovascular: Normal rate and regular rhythm.   Exam reveals no friction rub.   No murmur heard. Pulmonary/Chest: Effort normal and breath sounds normal. No respiratory distress. He has no wheezes. He has no rales.  Abdominal: Soft. He exhibits no distension. There is tenderness (L flanl, L groin). There is no rebound.  Musculoskeletal: Normal range of motion. He exhibits no edema.  Neurological: He is alert and oriented to person, place, and time.  Skin: No rash noted. He is not diaphoretic.    ED Course  Procedures (including critical care time) Labs Review Labs Reviewed  CBC - Abnormal; Notable for the following:    WBC 11.6 (*)    Hemoglobin 17.6 (*)    All other components within normal limits  BASIC METABOLIC PANEL  URINALYSIS, ROUTINE W REFLEX MICROSCOPIC    Imaging Review US Renal  09/10/2013   CLINICAL DATA:  Nephrolithiasis.  EXAM: RENAL/URINARY TRACT ULTRASOUND COMPLETE  COMPARISON:  Scrotal sonogram September 03, 2013 , retroperitoneal sonogram September 03, 2013, retroperitoneal sonogram September 01, 2013, CT of the abdomen pelvis July 29, 2013.  FINDINGS: Right Kidney:  Length: 12.7 cm. Echogenicity within normal limits. No mass or hydronephrosis visualized. 6 mm echogenic interpolar nephrolithiasis.  Left Kidney:  Length: 13.6 cm. Echogenicity within normal limits. No mass or hydronephrosis visualized. 6 mm interpolar, 8 mm lower pole echogenic nephrolithiasis.  Bladder:  Appears normal for degree of bladder distention. Bilateral ureteral jets identified.  IMPRESSION: Similar appearance of bilateral nonobstructing nephrolithiasis.   Electronically Signed   By: Elon Alas   On: 09/10/2013 22:11     EKG Interpretation None      MDM   Final diagnoses:  Chronic groin pain, left    33 year old male with history of chronic groin pain, here with same. He has chronic kidney stones and has had orchiectomy for chronic groin pain. Here vitals are stable. He has left-sided flank pain without any guarding or rebound. We'll  check labs, ultrasound his kidneys, give pain meds. Labs ultrasound are okay. Symptoms improving after 3 doses of pain meds. Stable for discharge.    Evelina Bucy, MD 09/10/13 [phone]

## 2013-09-10 NOTE — ED Notes (Signed)
Ice chips given, ok by MD

## 2013-09-10 NOTE — ED Notes (Signed)
Pt states this is his 4th visit in the past 2 weeks for same  Pt states he has 2 kidney stones on the left side   Pt states he is having pain in the left and right flank area and pain in his groin  Pt has nausea without vomiting

## 2013-09-10 NOTE — ED Notes (Signed)
Patient is aware a urine sample is needed, urinal placed at bedside, patient to notify staff when urine sample is ready.

## 2013-09-10 NOTE — ED Notes (Signed)
Patient reports ongoing kidney stone problems. Patient states pain flared up @ 2 hours ago. Patient also c/o nausea at this time.

## 2013-09-14 ENCOUNTER — Encounter (HOSPITAL_COMMUNITY): Payer: Self-pay | Admitting: Emergency Medicine

## 2013-09-14 ENCOUNTER — Emergency Department (HOSPITAL_COMMUNITY)
Admission: EM | Admit: 2013-09-14 | Discharge: 2013-09-14 | Disposition: A | Discharge status: Home / Self Care | Payer: BC Managed Care – PPO | Attending: Emergency Medicine | Admitting: Emergency Medicine

## 2013-09-14 DIAGNOSIS — R109 Unspecified abdominal pain: Secondary | ICD-10-CM | POA: Diagnosis not present

## 2013-09-14 DIAGNOSIS — Z87442 Personal history of urinary calculi: Secondary | ICD-10-CM | POA: Insufficient documentation

## 2013-09-14 DIAGNOSIS — Z9089 Acquired absence of other organs: Secondary | ICD-10-CM | POA: Diagnosis not present

## 2013-09-14 DIAGNOSIS — K219 Gastro-esophageal reflux disease without esophagitis: Secondary | ICD-10-CM | POA: Diagnosis not present

## 2013-09-14 DIAGNOSIS — R110 Nausea: Secondary | ICD-10-CM | POA: Insufficient documentation

## 2013-09-14 DIAGNOSIS — G8929 Other chronic pain: Secondary | ICD-10-CM | POA: Insufficient documentation

## 2013-09-14 DIAGNOSIS — Z79899 Other long term (current) drug therapy: Secondary | ICD-10-CM | POA: Diagnosis not present

## 2013-09-14 DIAGNOSIS — F3289 Other specified depressive episodes: Secondary | ICD-10-CM | POA: Diagnosis not present

## 2013-09-14 DIAGNOSIS — F329 Major depressive disorder, single episode, unspecified: Secondary | ICD-10-CM | POA: Insufficient documentation

## 2013-09-14 DIAGNOSIS — Z8669 Personal history of other diseases of the nervous system and sense organs: Secondary | ICD-10-CM | POA: Diagnosis not present

## 2013-09-14 DIAGNOSIS — Z9889 Other specified postprocedural states: Secondary | ICD-10-CM | POA: Insufficient documentation

## 2013-09-14 LAB — URINALYSIS, ROUTINE W REFLEX MICROSCOPIC
Bilirubin Urine: NEGATIVE
Glucose, UA: NEGATIVE mg/dL
Hgb urine dipstick: NEGATIVE
Ketones, ur: NEGATIVE mg/dL
Leukocytes, UA: NEGATIVE
Nitrite: NEGATIVE
Protein, ur: NEGATIVE mg/dL
Specific Gravity, Urine: 1.025 (ref 1.005–1.030)
Urobilinogen, UA: 0.2 mg/dL (ref 0.0–1.0)
pH: 5 (ref 5.0–8.0)

## 2013-09-14 LAB — CBC WITH DIFFERENTIAL/PLATELET
Basophils Absolute: 0 10*3/uL (ref 0.0–0.1)
Basophils Relative: 0 % (ref 0–1)
Eosinophils Absolute: 0.2 10*3/uL (ref 0.0–0.7)
Eosinophils Relative: 2 % (ref 0–5)
HCT: 50.4 % (ref 39.0–52.0)
Hemoglobin: 18.1 g/dL — ABNORMAL HIGH (ref 13.0–17.0)
Lymphocytes Relative: 28 % (ref 12–46)
Lymphs Abs: 3.1 10*3/uL (ref 0.7–4.0)
MCH: 30.8 pg (ref 26.0–34.0)
MCHC: 35.9 g/dL (ref 30.0–36.0)
MCV: 85.7 fL (ref 78.0–100.0)
Monocytes Absolute: 1.2 10*3/uL — ABNORMAL HIGH (ref 0.1–1.0)
Monocytes Relative: 11 % (ref 3–12)
Neutro Abs: 6.4 10*3/uL (ref 1.7–7.7)
Neutrophils Relative %: 59 % (ref 43–77)
Platelets: 294 10*3/uL (ref 150–400)
RBC: 5.88 MIL/uL — ABNORMAL HIGH (ref 4.22–5.81)
RDW: 13.3 % (ref 11.5–15.5)
WBC: 10.9 10*3/uL — ABNORMAL HIGH (ref 4.0–10.5)

## 2013-09-14 LAB — BASIC METABOLIC PANEL
Anion gap: 14 (ref 5–15)
BUN: 13 mg/dL (ref 6–23)
CO2: 28 mEq/L (ref 19–32)
Calcium: 10.6 mg/dL — ABNORMAL HIGH (ref 8.4–10.5)
Chloride: 97 mEq/L (ref 96–112)
Creatinine, Ser: 0.83 mg/dL (ref 0.50–1.35)
GFR calc Af Amer: >90 mL/min (ref >90)
GFR calc non Af Amer: >90 mL/min (ref >90)
Glucose, Bld: 121 mg/dL — ABNORMAL HIGH (ref 70–99)
Potassium: 3.6 mEq/L — ABNORMAL LOW (ref 3.7–5.3)
Sodium: 139 mEq/L (ref 137–147)

## 2013-09-14 MED ORDER — HYDROMORPHONE HCL PF 1 MG/ML IJ SOLN
1.0000 mg | Freq: Once | INTRAMUSCULAR | Status: AC
  Administered 2013-09-14: 1 mg via INTRAVENOUS
  Filled 2013-09-14: qty 1

## 2013-09-14 MED ORDER — LINEZOLID 2 MG/ML IV SOLN: 600.0000 mg | Freq: Once | INTRAVENOUS | Status: DC

## 2013-09-14 MED ORDER — FENTANYL CITRATE 0.05 MG/ML IJ SOLN
100.0000 ug | Freq: Once | INTRAMUSCULAR | Status: AC
  Administered 2013-09-14: 100 ug via INTRAVENOUS
  Filled 2013-09-14: qty 2

## 2013-09-14 MED ORDER — ONDANSETRON HCL 4 MG/2ML IJ SOLN
4.0000 mg | Freq: Once | INTRAMUSCULAR | Status: AC
  Administered 2013-09-14: 4 mg via INTRAVENOUS
  Filled 2013-09-14: qty 2

## 2013-09-14 NOTE — ED Notes (Signed)
Patient is given a urinal and encouraged to void when able. 

## 2013-09-14 NOTE — ED Notes (Signed)
Bed: [facility]13 Expected date: 09/14/13 Expected time: 3:59 PM Means of arrival: Ambulance Comments: suprapubic cath out

## 2013-09-14 NOTE — ED Provider Notes (Signed)
CSN: [id number]     Arrival date & time 09/14/13  1536 History   First MD Initiated Contact with Patient 09/14/13 1600     Chief Complaint  Patient presents with  . Nephrolithiasis  . Flank Pain     (Consider location/radiation/quality/duration/timing/severity/associated sxs/prior Treatment) Patient is a 33 y.o. male presenting with flank pain. The history is provided by the patient and medical records.  Flank Pain Associated symptoms include nausea.  This is a 33 year old male with past medical history significant for GERD, seizure disorder, depression, recurrent kidney stones and chronic groin pain, presenting to the ED for left flank pain, onset last night. Pain is described as sharp and stabbing radiation to left groin. He states symptoms are similar to prior episodes with kidney stones.  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  Patient also notes some dysuria.  Denies fever but states some cold chills last night.  Some mild nausea but denies vomiting.  BMs have been normal, some pain with this as patient has hx of anal fissures.  No melena or hematochezia noted.  Has been taking his home pain meds without noted improvement.  Patient followed regularly by urology, Dr. Rosana Hoes.  Past Medical History  Diagnosis Date  . GERD (gastroesophageal reflux disease)   . History of kidney stones   . Seizures Childhood  . Depression     and panic/anxiety  . Insomnia   . Kidney stones    Past Surgical History  Procedure Laterality Date  . Lithotripsy      four times over the years; most recent 10/08/12  . Appendectomy  2011  . Laparoscopic cholecystectomy  1996  . Tonsillectomy  1992  . Wrist ganglion excision  2002    Right  . Hydrocele excision Left 06/2012  . Orchiectomy Left 11/2012   Family History  Problem Relation Age of Onset  .  Stroke Mother   . Liver disease Mother   . Irritable bowel syndrome Mother   . Kidney disease Mother   . Heart disease Father   . Colon polyps Father   . Colon cancer Neg Hx    History  Substance Use Topics  . Smoking status: Never Smoker   . Smokeless tobacco: Never Used  . Alcohol Use: Yes     Comment: rarely    Review of Systems  Gastrointestinal: Positive for nausea.  Genitourinary: Positive for flank pain.  All other systems reviewed and are negative.     Allergies  Review of patient's allergies indicates no known allergies.  Home Medications   Prior to Admission medications   Medication Sig Start Date End Date Taking? Authorizing Provider  docusate sodium (COLACE) 100 MG capsule Take 100 mg by mouth 2 (two) times daily.    Yes Historical Provider, MD  escitalopram (LEXAPRO) 20 MG tablet Take 1 tablet (20 mg total) by mouth daily. 09/05/13  Yes Tonia Ghent, MD  HYDROcodone-acetaminophen [facility]) 10-325 MG per tablet Take 1.5 tablets by mouth every 6 (six) hours as needed for moderate pain or severe pain. 09/04/13  Yes Tonia Ghent, MD  ibuprofen (ADVIL,MOTRIN) 200 MG tablet Take 600-800 mg by mouth every 6 (six) hours as needed for mild pain or moderate pain.    Yes Historical Provider, MD  indapamide (LOZOL) 2.5 MG tablet Take 2.5 mg by mouth  every morning.    Yes Historical Provider, MD  ondansetron (ZOFRAN-ODT) 4 MG disintegrating tablet Take 4 mg by mouth every 4 (four) hours as needed for nausea or vomiting.   Yes Historical Provider, MD  pantoprazole (PROTONIX) 40 MG tablet Take 40 mg by mouth 2 (two) times daily.   Yes Historical Provider, MD  potassium chloride (K-DUR) 10 MEQ tablet Take 10 mEq by mouth 2 (two) times daily.   Yes Historical Provider, MD  zolpidem (AMBIEN) 10 MG tablet Take 1 tablet (10 mg total) by mouth at bedtime. 08/06/13  Yes Tonia Ghent, MD   BP 146/99  Pulse 94  Temp(Src) 97.6 F (36.4 C) (Oral)  Resp 18  SpO2 99%  Physical  Exam  Nursing note and vitals reviewed. Constitutional: He is oriented to person, place, and time. He appears well-developed and well-nourished. No distress.  HENT:  Head: Normocephalic and atraumatic.  Mouth/Throat: Oropharynx is clear and moist.  Eyes: Conjunctivae and EOM are normal. Pupils are equal, round, and reactive to light.  Neck: Normal range of motion. Neck supple.  Cardiovascular: Normal rate, regular rhythm and normal heart sounds.   Pulmonary/Chest: Effort normal and breath sounds normal. He has no wheezes.  Abdominal: Soft. Bowel sounds are normal. There is no tenderness. There is CVA tenderness (left). There is no guarding.  Left CVA tenderness with radiation to left groin  Musculoskeletal: Normal range of motion.  Neurological: He is alert and oriented to person, place, and time.  Skin: Skin is warm and dry. He is not diaphoretic.  Psychiatric: He has a normal mood and affect.    ED Course  Procedures (including critical care time) Labs Review Labs Reviewed  CBC WITH DIFFERENTIAL - Abnormal; Notable for the following:    WBC 10.9 (*)    RBC 5.88 (*)    Hemoglobin 18.1 (*)    Monocytes Absolute 1.2 (*)    All other components within normal limits  BASIC METABOLIC PANEL - Abnormal; Notable for the following:    Potassium 3.6 (*)    Glucose, Bld 121 (*)    Calcium 10.6 (*)    All other components within normal limits  URINALYSIS, ROUTINE W REFLEX MICROSCOPIC    Imaging Review No results found.   EKG Interpretation None      MDM   Final diagnoses:  Left flank pain   33 year old male with significant history of kidney stones as well as common chronic pain, presented to the ED for aforementioned complaints. On exam he is afebrile overall nontoxic appearing. He has mild left CVA tenderness with radiation to left groin which is consistent with his usual pain.  Given sx of pain with BM, offered rectal exam to ensure no abscess, however patient declined.  Will  obtain basic labs, pain meds and anti-emetics given.  Lab work is reassuring.  U/a clear.  Pain has been controlled with IV meds.  Sx and lab work appear baseline for patient, recent renal u/s 4 days ago-- do not feel repeat imaging needed today.  Patient will be d/c home to FU with his urologist.  Discussed plan with patient, he/she acknowledged understanding and agreed with plan of care.  Return precautions given for new or worsening symptoms.  Larene Pickett, PA-C 09/14/13 2356

## 2013-09-14 NOTE — Discharge Instructions (Signed)
Continue your home pain medications. Follow-up with urology. Return to the ED for new or worsening symptoms.

## 2013-09-14 NOTE — ED Notes (Signed)
Pt w/ significant hx of kidney stones.  C/o lt flank pain since last night.

## 2013-09-18 ENCOUNTER — Emergency Department (HOSPITAL_COMMUNITY)
Admission: EM | Admit: 2013-09-18 | Discharge: 2013-09-19 | Disposition: A | Discharge status: Home / Self Care | Payer: BC Managed Care – PPO | Attending: Emergency Medicine | Admitting: Emergency Medicine

## 2013-09-18 ENCOUNTER — Encounter (HOSPITAL_COMMUNITY): Payer: Self-pay | Admitting: Emergency Medicine

## 2013-09-18 ENCOUNTER — Emergency Department (HOSPITAL_COMMUNITY): Payer: BC Managed Care – PPO

## 2013-09-18 DIAGNOSIS — F329 Major depressive disorder, single episode, unspecified: Secondary | ICD-10-CM | POA: Insufficient documentation

## 2013-09-18 DIAGNOSIS — Z79899 Other long term (current) drug therapy: Secondary | ICD-10-CM | POA: Diagnosis not present

## 2013-09-18 DIAGNOSIS — F3289 Other specified depressive episodes: Secondary | ICD-10-CM | POA: Diagnosis not present

## 2013-09-18 DIAGNOSIS — R109 Unspecified abdominal pain: Secondary | ICD-10-CM | POA: Insufficient documentation

## 2013-09-18 DIAGNOSIS — K219 Gastro-esophageal reflux disease without esophagitis: Secondary | ICD-10-CM | POA: Diagnosis not present

## 2013-09-18 DIAGNOSIS — Z87442 Personal history of urinary calculi: Secondary | ICD-10-CM | POA: Insufficient documentation

## 2013-09-18 DIAGNOSIS — Z8669 Personal history of other diseases of the nervous system and sense organs: Secondary | ICD-10-CM | POA: Insufficient documentation

## 2013-09-18 DIAGNOSIS — R110 Nausea: Secondary | ICD-10-CM | POA: Insufficient documentation

## 2013-09-18 DIAGNOSIS — Z9089 Acquired absence of other organs: Secondary | ICD-10-CM | POA: Diagnosis not present

## 2013-09-18 DIAGNOSIS — R1032 Left lower quadrant pain: Secondary | ICD-10-CM

## 2013-09-18 DIAGNOSIS — G8929 Other chronic pain: Secondary | ICD-10-CM | POA: Insufficient documentation

## 2013-09-18 LAB — CBC WITH DIFFERENTIAL/PLATELET
Basophils Absolute: 0 10*3/uL (ref 0.0–0.1)
Basophils Relative: 0 % (ref 0–1)
Eosinophils Absolute: 0.1 10*3/uL (ref 0.0–0.7)
Eosinophils Relative: 1 % (ref 0–5)
HCT: 47.3 % (ref 39.0–52.0)
Hemoglobin: 17.2 g/dL — ABNORMAL HIGH (ref 13.0–17.0)
Lymphocytes Relative: 30 % (ref 12–46)
Lymphs Abs: 3.5 10*3/uL (ref 0.7–4.0)
MCH: 30.6 pg (ref 26.0–34.0)
MCHC: 36.4 g/dL — ABNORMAL HIGH (ref 30.0–36.0)
MCV: 84 fL (ref 78.0–100.0)
Monocytes Absolute: 1.2 10*3/uL — ABNORMAL HIGH (ref 0.1–1.0)
Monocytes Relative: 10 % (ref 3–12)
Neutro Abs: 6.8 10*3/uL (ref 1.7–7.7)
Neutrophils Relative %: 59 % (ref 43–77)
Platelets: 312 10*3/uL (ref 150–400)
RBC: 5.63 MIL/uL (ref 4.22–5.81)
RDW: 13.3 % (ref 11.5–15.5)
WBC: 11.7 10*3/uL — ABNORMAL HIGH (ref 4.0–10.5)

## 2013-09-18 LAB — URINALYSIS, ROUTINE W REFLEX MICROSCOPIC
Bilirubin Urine: NEGATIVE
Glucose, UA: NEGATIVE mg/dL
Hgb urine dipstick: NEGATIVE
Ketones, ur: NEGATIVE mg/dL
Leukocytes, UA: NEGATIVE
Nitrite: NEGATIVE
Protein, ur: NEGATIVE mg/dL
Specific Gravity, Urine: 1.02 (ref 1.005–1.030)
Urobilinogen, UA: 0.2 mg/dL (ref 0.0–1.0)
pH: 5.5 (ref 5.0–8.0)

## 2013-09-18 LAB — HEPATIC FUNCTION PANEL
ALT: 39 U/L (ref 0–53)
AST: 30 U/L (ref 0–37)
Albumin: 4.6 g/dL (ref 3.5–5.2)
Alkaline Phosphatase: 69 U/L (ref 39–117)
Bilirubin, Direct: <0.2 mg/dL (ref 0.0–0.3)
Total Bilirubin: 0.7 mg/dL (ref 0.3–1.2)
Total Protein: 7.5 g/dL (ref 6.0–8.3)

## 2013-09-18 LAB — BASIC METABOLIC PANEL
Anion gap: 18 — ABNORMAL HIGH (ref 5–15)
BUN: 13 mg/dL (ref 6–23)
CO2: 25 mEq/L (ref 19–32)
Calcium: 10.1 mg/dL (ref 8.4–10.5)
Chloride: 96 mEq/L (ref 96–112)
Creatinine, Ser: 0.95 mg/dL (ref 0.50–1.35)
GFR calc Af Amer: >90 mL/min (ref >90)
GFR calc non Af Amer: >90 mL/min (ref >90)
Glucose, Bld: 93 mg/dL (ref 70–99)
Potassium: 3.6 mEq/L — ABNORMAL LOW (ref 3.7–5.3)
Sodium: 139 mEq/L (ref 137–147)

## 2013-09-18 MED ORDER — SODIUM CHLORIDE 0.9 % IV BOLUS (SEPSIS)
1000.0000 mL | Freq: Once | INTRAVENOUS | Status: AC
  Administered 2013-09-18: 1000 mL via INTRAVENOUS

## 2013-09-18 MED ORDER — HYDROMORPHONE HCL PF 1 MG/ML IJ SOLN
1.0000 mg | Freq: Once | INTRAMUSCULAR | Status: AC
  Administered 2013-09-19: 1 mg via INTRAVENOUS
  Filled 2013-09-18: qty 1

## 2013-09-18 MED ORDER — ONDANSETRON HCL 4 MG/2ML IJ SOLN
4.0000 mg | Freq: Once | INTRAMUSCULAR | Status: AC
Start: 2013-09-18 — End: 2013-09-18
  Administered 2013-09-18: 4 mg via INTRAVENOUS
  Filled 2013-09-18: qty 2

## 2013-09-18 MED ORDER — METHYLPREDNISOLONE SODIUM SUCC 125 MG IJ SOLR
125.0000 mg | Freq: Once | INTRAMUSCULAR | Status: AC
  Administered 2013-09-18: 125 mg via INTRAVENOUS
  Filled 2013-09-18: qty 2

## 2013-09-18 MED ORDER — HYDROMORPHONE HCL PF 1 MG/ML IJ SOLN
1.0000 mg | Freq: Once | INTRAMUSCULAR | Status: AC
  Administered 2013-09-18: 1 mg via INTRAVENOUS
  Filled 2013-09-18: qty 1

## 2013-09-18 NOTE — ED Notes (Signed)
Pt c/o L flank pain radiating to L groin x 2 hours, +nausea.

## 2013-09-18 NOTE — ED Provider Notes (Signed)
CSN: [id number]     Arrival date & time 09/18/13  2003 History   First MD Initiated Contact with Patient 09/18/13 2105     Chief Complaint  Patient presents with  . Flank Pain     (Consider location/radiation/quality/duration/timing/severity/associated sxs/prior Treatment) HPI  Casey Caldwell is a 33 y.o. male complaining of exacerbation of chronic left flank and left groin pain associated with nausea. Patient states that this is becoming typical for him, as this pain it is worsening and recurs every 4 days. He follows with pain management out of [facility]. He has a normal nerve block scheduled for 3 weeks. Patient denies fever, chills, change in bowel or bladder habits. He is taking oxycodone at home with little relief. Right testicle is sore but this is normal when his pain is severe. Urinary leaking and dysuria x2weeks  Urology: Rosana Hoes   Past Medical History  Diagnosis Date  . GERD (gastroesophageal reflux disease)   . History of kidney stones   . Seizures Childhood  . Depression     and panic/anxiety  . Insomnia   . Kidney stones    Past Surgical History  Procedure Laterality Date  . Lithotripsy      four times over the years; most recent 10/08/12  . Appendectomy  2011  . Laparoscopic cholecystectomy  1996  . Tonsillectomy  1992  . Wrist ganglion excision  2002    Right  . Hydrocele excision Left 06/2012  . Orchiectomy Left 11/2012   Family History  Problem Relation Age of Onset  . Stroke Mother   . Liver disease Mother   . Irritable bowel syndrome Mother   . Kidney disease Mother   . Heart disease Father   . Colon polyps Father   . Colon cancer Neg Hx    History  Substance Use Topics  . Smoking status: Never Smoker   . Smokeless tobacco: Never Used  . Alcohol Use: Yes     Comment: rarely    Review of Systems  10 systems reviewed and found to be negative, except as noted in the HPI.   Allergies  Review of patient's allergies indicates no known  allergies.  Home Medications   Prior to Admission medications   Medication Sig Start Date End Date Taking? Authorizing Provider  docusate sodium (COLACE) 100 MG capsule Take 100 mg by mouth 2 (two) times daily.    Yes Historical Provider, MD  escitalopram (LEXAPRO) 20 MG tablet Take 1 tablet (20 mg total) by mouth daily. 09/05/13  Yes Tonia Ghent, MD  HYDROcodone-acetaminophen [facility]) 10-325 MG per tablet Take 1.5 tablets by mouth every 6 (six) hours as needed for moderate pain or severe pain. 09/04/13  Yes Tonia Ghent, MD  ibuprofen (ADVIL,MOTRIN) 200 MG tablet Take 600-800 mg by mouth every 6 (six) hours as needed for mild pain or moderate pain.    Yes Historical Provider, MD  indapamide (LOZOL) 2.5 MG tablet Take 2.5 mg by mouth every morning.    Yes Historical Provider, MD  ondansetron (ZOFRAN-ODT) 4 MG disintegrating tablet Take 4 mg by mouth every 4 (four) hours as needed for nausea or vomiting.   Yes Historical Provider, MD  pantoprazole (PROTONIX) 40 MG tablet Take 40 mg by mouth 2 (two) times daily.   Yes Historical Provider, MD  potassium chloride (K-DUR) 10 MEQ tablet Take 10 mEq by mouth 2 (two) times daily.   Yes Historical Provider, MD  zolpidem (AMBIEN) 10 MG tablet Take 1 tablet (10  mg total) by mouth at bedtime. 08/06/13  Yes Tonia Ghent, MD   BP 149/110  Pulse 114  Temp(Src) 98.2 F (36.8 C) (Oral)  Resp 16  Ht 6' (1.829 m)  Wt 275 lb (124.739 kg)  BMI 37.29 kg/m2  SpO2 96% Physical Exam  Nursing note and vitals reviewed. Constitutional: He is oriented to person, place, and time. He appears well-developed and well-nourished. No distress.  HENT:  Head: Normocephalic and atraumatic.  Mouth/Throat: Oropharynx is clear and moist.  Eyes: Conjunctivae and EOM are normal. Pupils are equal, round, and reactive to light.  Neck: Normal range of motion.  Cardiovascular: Normal rate, regular rhythm and intact distal pulses.   Pulmonary/Chest: Effort normal and breath  sounds normal. No stridor. No respiratory distress. He has no wheezes. He has no rales. He exhibits no tenderness.  Abdominal: Soft. Bowel sounds are normal. He exhibits no distension and no mass. There is tenderness. There is no rebound and no guarding.  Genitourinary:  No CVA tenderness palpation bilaterally  Musculoskeletal: Normal range of motion.  Neurological: He is alert and oriented to person, place, and time.  Psychiatric: He has a normal mood and affect.    ED Course  Procedures (including critical care time) Labs Review Labs Reviewed  CBC WITH DIFFERENTIAL - Abnormal; Notable for the following:    WBC 11.7 (*)    Hemoglobin 17.2 (*)    MCHC 36.4 (*)    Monocytes Absolute 1.2 (*)    All other components within normal limits  BASIC METABOLIC PANEL - Abnormal; Notable for the following:    Potassium 3.6 (*)    Anion gap 18 (*)    All other components within normal limits  BASIC METABOLIC PANEL - Abnormal; Notable for the following:    Potassium 3.6 (*)    Glucose, Bld 105 (*)    All other components within normal limits  HEPATIC FUNCTION PANEL  URINALYSIS, ROUTINE W REFLEX MICROSCOPIC  I-STAT CG4 LACTIC ACID, ED    Imaging Review US Renal  09/19/2013   CLINICAL DATA:  Bilateral flank pain.  EXAM: RENAL/URINARY TRACT ULTRASOUND COMPLETE  COMPARISON:  Previous examinations, including the renal ultrasound dated 08/26/2013 and CT dated 07/29/2013.  FINDINGS: Right Kidney:  Length: 12.6 cm. Echogenicity within normal limits. No mass or hydronephrosis visualized. 5 mm calculus in the lower pole  Left Kidney:  Length: 12.0 cm. Echogenicity within normal limits. No mass or hydronephrosis visualized. 7 mm calculus in the lower pole  Bladder:  Appears normal for degree of bladder distention.  IMPRESSION: Small, bilateral, nonobstructing renal calculi.   Electronically Signed   By: Enrique Sack M.D.   On: 09/19/2013 00:06     EKG Interpretation None      MDM   Final diagnoses:   Groin pain, left  Chronic pain    Filed Vitals:   09/18/13 2035 09/18/13 2055  BP: 149/110   Pulse: 114   Temp: 98.2 F (36.8 C)   TempSrc: Oral   Resp: 16   Height:  6' (1.829 m)  Weight:  275 lb (124.739 kg)  SpO2: 96%     Medications  sodium chloride 0.9 % bolus 1,000 mL (0 mLs Intravenous Stopped 09/18/13 2322)  ondansetron (ZOFRAN) injection 4 mg (4 mg Intravenous Given 09/18/13 2234)  HYDROmorphone (DILAUDID) injection 1 mg (1 mg Intravenous Given 09/18/13 2234)  methylPREDNISolone sodium succinate (SOLU-MEDROL) 125 mg/2 mL injection 125 mg (125 mg Intravenous Given 09/18/13 2234)  sodium chloride 0.9 %  bolus 1,000 mL (1,000 mLs Intravenous New Bag/Given 09/18/13 2321)  HYDROmorphone (DILAUDID) injection 1 mg (1 mg Intravenous Given 09/19/13 0026)  ondansetron (ZOFRAN) injection 4 mg (4 mg Intravenous Given 09/19/13 0025)    Casey Caldwell is a 33 y.o. male presenting with exacerbation of chronic groin pain. Patient initially tachycardic however this is resolved by my exam. Patient has received several doses of Dilaudid in the ED, fluid bolus given. Initially he had anion gap of 18. This is resolved to 14 after rehydration. Patient with a mild leukocytosis of 11.7 which is not atypical for him. He is afebrile in the ED. renal ultrasound shows several intrarenal nonobstructing stones with no hydronephrosis bilaterally. Pain improved in the ED.  I have had an extensive discussion with the patient on following with pain management to manage his pain and I've explained to him that at some point pain medication will not be given in the  ED, I have advised him to talk openly with his pain management physicians at [facility] and advised him to let them know that he is coming to the ED at 33 days for IV Dilaudid..  Evaluation does not show pathology that would require ongoing emergent intervention or inpatient treatment. Pt is hemodynamically stable and mentating appropriately. Discussed  findings and plan with patient/guardian, who agrees with care plan. All questions answered. Return precautions discussed and outpatient follow up given.   New Prescriptions   No medications on file         Monico Blitz, PA-C 09/19/13 0222

## 2013-09-19 ENCOUNTER — Telehealth: Payer: Self-pay | Admitting: *Deleted

## 2013-09-19 ENCOUNTER — Telehealth: Payer: Self-pay | Admitting: Family Medicine

## 2013-09-19 LAB — BASIC METABOLIC PANEL
Anion gap: 14 (ref 5–15)
BUN: 13 mg/dL (ref 6–23)
CO2: 26 mEq/L (ref 19–32)
Calcium: 9.1 mg/dL (ref 8.4–10.5)
Chloride: 100 mEq/L (ref 96–112)
Creatinine, Ser: 0.97 mg/dL (ref 0.50–1.35)
GFR calc Af Amer: >90 mL/min (ref >90)
GFR calc non Af Amer: >90 mL/min (ref >90)
Glucose, Bld: 105 mg/dL — ABNORMAL HIGH (ref 70–99)
Potassium: 3.6 mEq/L — ABNORMAL LOW (ref 3.7–5.3)
Sodium: 140 mEq/L (ref 137–147)

## 2013-09-19 LAB — I-STAT CG4 LACTIC ACID, ED: Lactic Acid, Venous: 0.8 mmol/L (ref 0.5–2.2)

## 2013-09-19 MED ORDER — HYDROMORPHONE HCL 4 MG PO TABS: 2.0000 mg | ORAL_TABLET | Freq: Four times a day (QID) | ORAL | Status: DC | PRN

## 2013-09-19 MED ORDER — ONDANSETRON HCL 4 MG/2ML IJ SOLN
4.0000 mg | Freq: Once | INTRAMUSCULAR | Status: AC
  Administered 2013-09-19: 4 mg via INTRAVENOUS
  Filled 2013-09-19: qty 2

## 2013-09-19 NOTE — Telephone Encounter (Signed)
Let me try to call the pain clinic first- Dr. Catheryn Bacon.  I called and LMOVM in the meantime at the pain clinic.

## 2013-09-19 NOTE — Discharge Instructions (Signed)
Please follow with your primary care doctor in the next 2 days for a check-up. They must obtain records for further management.  ° °Do not hesitate to return to the Emergency Department for any new, worsening or concerning symptoms.  ° °

## 2013-09-19 NOTE — Telephone Encounter (Signed)
Called pain clinic again.

## 2013-09-19 NOTE — Telephone Encounter (Signed)
Pt call requesting call back about some issues Dr. Damita Dunnings has been treating him for. Please advise.

## 2013-09-19 NOTE — Telephone Encounter (Signed)
Patient says he is having some issues with the problems that he has been seeing you about.  He says he is ending up in the ER about every couple days with the severe pain.  He has tried to contact pain management and they are apparently unavailable.  He has left VM's but has not gotten any responses.  Patient is not sure if Pain Mgmt is on vacation or what.  He is asking if you could prescribe Hydromorphone or something stronger than what he has until he can get in touch with Pain Mgmt?

## 2013-09-19 NOTE — Telephone Encounter (Addendum)
rx printed for dilaudid pills.  To be used only in extreme cases, sedation caution.  Please give to patient.  Thanks.  I am still awaiting call from pain clinic.

## 2013-09-19 NOTE — Telephone Encounter (Signed)
Late entry.  D/w staff at pain clinic, attending out of clinic.  Message given re: my rx for dilaudid for patient.

## 2013-09-19 NOTE — Telephone Encounter (Signed)
Patient advised.  Rx left at front desk for pick up. 

## 2013-09-19 NOTE — Addendum Note (Signed)
Addended by: Tonia Ghent on: 09/19/2013 05:08 PM   Modules accepted: Orders

## 2013-09-20 NOTE — ED Provider Notes (Signed)
Medical screening examination/treatment/procedure(s) were performed by non-physician practitioner and as supervising physician I was immediately available for consultation/collaboration.   EKG Interpretation None        Delice Bison Shianne Zeiser, DO 09/20/13 0023

## 2013-09-24 NOTE — ED Provider Notes (Signed)
Medical screening examination/treatment/procedure(s) were performed by non-physician practitioner and as supervising physician I was immediately available for consultation/collaboration.   EKG Interpretation None       Babette Relic, MD 09/24/13 2041

## 2013-09-26 ENCOUNTER — Ambulatory Visit (INDEPENDENT_AMBULATORY_CARE_PROVIDER_SITE_OTHER): Payer: BC Managed Care – PPO | Admitting: Family Medicine

## 2013-09-26 ENCOUNTER — Encounter: Payer: Self-pay | Admitting: Family Medicine

## 2013-09-26 VITALS — BP 146/102 | HR 98 | Temp 97.4°F | Wt 276.0 lb

## 2013-09-26 DIAGNOSIS — G4700 Insomnia, unspecified: Secondary | ICD-10-CM

## 2013-09-26 DIAGNOSIS — N200 Calculus of kidney: Secondary | ICD-10-CM

## 2013-09-26 DIAGNOSIS — F32A Depression, unspecified: Secondary | ICD-10-CM

## 2013-09-26 DIAGNOSIS — F419 Anxiety disorder, unspecified: Secondary | ICD-10-CM

## 2013-09-26 DIAGNOSIS — F341 Dysthymic disorder: Secondary | ICD-10-CM

## 2013-09-26 DIAGNOSIS — F329 Major depressive disorder, single episode, unspecified: Secondary | ICD-10-CM

## 2013-09-26 MED ORDER — DOCUSATE SODIUM 100 MG PO CAPS: 100.0000 mg | ORAL_CAPSULE | Freq: Two times a day (BID) | ORAL | Status: DC

## 2013-09-26 MED ORDER — POLYETHYLENE GLYCOL 3350 17 GM/SCOOP PO POWD: 17.0000 g | Freq: Two times a day (BID) | ORAL | Status: DC | PRN

## 2013-09-26 MED ORDER — HYDROCODONE-ACETAMINOPHEN 10-325 MG PO TABS: 1.5000 | ORAL_TABLET | Freq: Four times a day (QID) | ORAL | Status: DC | PRN

## 2013-09-26 MED ORDER — ZOLPIDEM TARTRATE 10 MG PO TABS: 10.0000 mg | ORAL_TABLET | Freq: Every day | ORAL | Status: DC

## 2013-09-26 NOTE — Patient Instructions (Signed)
Add on miralax if needed, up to twice a day.  Use the dilaudid only if needed and try to get through the injection next week. Update me at that point.  Take care.  Glad to see you.

## 2013-09-26 NOTE — Progress Notes (Signed)
Pre visit review using our clinic review tool, if applicable. No additional management support is needed unless otherwise documented below in the visit note.  L leg pain, groin pain, back pain and pain from renal stones. He had oral dilaudid and he still had sig enough pain to need to go to ER mult times.  Prev ER notes reviewed.  Still with mult stones passed recently.  He has f/u with pain clinic pending, 10/01/13 for a test block.  10/15/13 he'll have another cortisone injection.  He has been appropriate with his meds.  Unfortunately, he has been having pain flares that are severe enough to needed medicine beyond the potency of oral meds. I called the pain clinic prev to notify then about his dilaudid rx, that I had written.  D/w pt about bowel regimen, adding on miralax.    He continues to have trouble sleeping, taking ambien with some effect.  No ADE on medicine.  His mood is improved with lexapro.  No SI/HI.  He will occ get "down" from the pain, but this is both expected and still more tolerable than prev.    Meds, vitals, and allergies reviewed.   ROS: See HPI.  Otherwise, noncontributory.  nad but uncomfortable Mmm Neck supple no LA rrr ctab Abd soft, ttp on the L side of the abd Ext w/o edema

## 2013-09-27 ENCOUNTER — Telehealth: Payer: Self-pay

## 2013-09-27 NOTE — Assessment & Plan Note (Signed)
Continue ambien, no ADE.

## 2013-09-27 NOTE — Assessment & Plan Note (Signed)
Manageable with lexapro, continue as is.

## 2013-09-27 NOTE — Telephone Encounter (Signed)
pts wife,Quinn left v/m; pt is presently at [facility] in extreme pain and hospital is not giving pt med for pain. Leonides Sake wants to know if Dr Damita Dunnings can contact hospital about pts treatment. Unable to reach [place] at [phone].

## 2013-09-27 NOTE — Assessment & Plan Note (Addendum)
With L leg pain, groin pain, back pain and pain from renal stones. He had oral dilaudid and he still had sig enough pain to need to go to ER mult times.  Prev ER notes reviewed.  Still with mult stones passed recently.  He has f/u with pain clinic pending, 10/01/13 for a test block.  10/15/13 he'll have another cortisone injection.  He has been appropriate with his meds.  Unfortunately, he has been having pain flares that are severe enough to needed medicine beyond the potency of oral meds. I called the pain clinic prev to notify then about his dilaudid rx, that I had written.  D/w pt about bowel regimen, adding on miralax.   He agrees.  Will await results from the pain clinic next week. >25 minutes spent in face to face time with patient, >50% spent in counselling or coordination of care.

## 2013-09-30 NOTE — Telephone Encounter (Signed)
Late entry.  I called pt back on the day of his incoming call, that afternoon.  By then he was back home.  D/w pt.  He did some IV dilaudid at the ER.  He's going to try to manage the pain at home in the meantime and get his nerve block done early next week.  I didn't intervene o/w.

## 2013-10-04 ENCOUNTER — Encounter: Payer: Self-pay | Admitting: Family Medicine

## 2013-10-12 ENCOUNTER — Emergency Department (HOSPITAL_COMMUNITY): Payer: BC Managed Care – PPO

## 2013-10-12 ENCOUNTER — Encounter (HOSPITAL_COMMUNITY): Payer: Self-pay | Admitting: Emergency Medicine

## 2013-10-12 ENCOUNTER — Emergency Department (HOSPITAL_COMMUNITY)
Admission: EM | Admit: 2013-10-12 | Discharge: 2013-10-12 | Disposition: A | Discharge status: Home / Self Care | Payer: BC Managed Care – PPO | Attending: Emergency Medicine | Admitting: Emergency Medicine

## 2013-10-12 DIAGNOSIS — F329 Major depressive disorder, single episode, unspecified: Secondary | ICD-10-CM | POA: Diagnosis not present

## 2013-10-12 DIAGNOSIS — K219 Gastro-esophageal reflux disease without esophagitis: Secondary | ICD-10-CM | POA: Insufficient documentation

## 2013-10-12 DIAGNOSIS — R109 Unspecified abdominal pain: Secondary | ICD-10-CM | POA: Diagnosis present

## 2013-10-12 DIAGNOSIS — Z87442 Personal history of urinary calculi: Secondary | ICD-10-CM | POA: Diagnosis not present

## 2013-10-12 DIAGNOSIS — F3289 Other specified depressive episodes: Secondary | ICD-10-CM | POA: Insufficient documentation

## 2013-10-12 DIAGNOSIS — Z79899 Other long term (current) drug therapy: Secondary | ICD-10-CM | POA: Diagnosis not present

## 2013-10-12 DIAGNOSIS — N509 Disorder of male genital organs, unspecified: Secondary | ICD-10-CM | POA: Diagnosis not present

## 2013-10-12 DIAGNOSIS — G8929 Other chronic pain: Secondary | ICD-10-CM | POA: Diagnosis not present

## 2013-10-12 LAB — URINALYSIS, ROUTINE W REFLEX MICROSCOPIC
Bilirubin Urine: NEGATIVE
Glucose, UA: NEGATIVE mg/dL
Hgb urine dipstick: NEGATIVE
Ketones, ur: NEGATIVE mg/dL
Leukocytes, UA: NEGATIVE
Nitrite: NEGATIVE
Protein, ur: NEGATIVE mg/dL
Specific Gravity, Urine: 1.019 (ref 1.005–1.030)
Urobilinogen, UA: 1 mg/dL (ref 0.0–1.0)
pH: 6 (ref 5.0–8.0)

## 2013-10-12 LAB — BASIC METABOLIC PANEL
Anion gap: 12 (ref 5–15)
BUN: 13 mg/dL (ref 6–23)
CO2: 31 mEq/L (ref 19–32)
Calcium: 10 mg/dL (ref 8.4–10.5)
Chloride: 97 mEq/L (ref 96–112)
Creatinine, Ser: 0.98 mg/dL (ref 0.50–1.35)
GFR calc Af Amer: >90 mL/min (ref >90)
GFR calc non Af Amer: >90 mL/min (ref >90)
Glucose, Bld: 98 mg/dL (ref 70–99)
Potassium: 4.3 mEq/L (ref 3.7–5.3)
Sodium: 140 mEq/L (ref 137–147)

## 2013-10-12 LAB — CBC
HCT: 48 % (ref 39.0–52.0)
Hemoglobin: 16.9 g/dL (ref 13.0–17.0)
MCH: 30.5 pg (ref 26.0–34.0)
MCHC: 35.2 g/dL (ref 30.0–36.0)
MCV: 86.5 fL (ref 78.0–100.0)
Platelets: 341 10*3/uL (ref 150–400)
RBC: 5.55 MIL/uL (ref 4.22–5.81)
RDW: 13.1 % (ref 11.5–15.5)
WBC: 10.9 10*3/uL — ABNORMAL HIGH (ref 4.0–10.5)

## 2013-10-12 MED ORDER — ONDANSETRON HCL 4 MG/2ML IJ SOLN
4.0000 mg | Freq: Once | INTRAMUSCULAR | Status: AC
  Administered 2013-10-12: 4 mg via INTRAVENOUS
  Filled 2013-10-12: qty 2

## 2013-10-12 MED ORDER — HYDROMORPHONE HCL 1 MG/ML IJ SOLN
1.0000 mg | Freq: Once | INTRAMUSCULAR | Status: AC
  Administered 2013-10-12: 1 mg via INTRAVENOUS
  Filled 2013-10-12: qty 1

## 2013-10-12 MED ORDER — HYDROMORPHONE HCL 2 MG/ML IJ SOLN
2.0000 mg | Freq: Once | INTRAMUSCULAR | Status: AC
  Administered 2013-10-12: 2 mg via INTRAVENOUS
  Filled 2013-10-12: qty 1

## 2013-10-12 MED ORDER — KETOROLAC TROMETHAMINE 30 MG/ML IJ SOLN
30.0000 mg | Freq: Once | INTRAMUSCULAR | Status: AC
  Administered 2013-10-12: 30 mg via INTRAVENOUS
  Filled 2013-10-12: qty 1

## 2013-10-12 NOTE — ED Notes (Addendum)
Pt reports that his pain is 6/10 and would like more pain meds and something for nausea.

## 2013-10-12 NOTE — ED Notes (Signed)
Pt have sudden onset sharp epigastric pain. EKG performed and Vital assessed

## 2013-10-12 NOTE — ED Notes (Signed)
Pt presents with c/o left flank pain, testicle pain, and pain shooting to his groin area and the tip of his penis. Hx of kidney stones and femoral nerve issues. Pt recently had a procedure done on his femoral nerves. Pt has some discomfort with urination but denies any hematuria.

## 2013-10-12 NOTE — ED Provider Notes (Signed)
CSN: [id number]     Arrival date & time 10/12/13  1701 History   First MD Initiated Contact with Patient 10/12/13 1718     Chief Complaint  Patient presents with  . Flank Pain  . Testicle Pain      HPI Patient has a history of chronic abdominal pain, chronic groin pain , chronic flank and testicle pain.  He's had this pain for 1-2 years.  He is being managed currently in a pain management office with recent nerve block.  He is managed at home with Norco and fentanyl patch.  His had a orchiectomy in the past as it was thought that maybe this was a left testicular issue.  He states he has a long-standing history of kidney stones as well.  He reports that his pain is normally well-controlled with this fentanyl and when necessary Norco.  He states his pain worsened several hours ago and feels like severe left flank pain radiating into his left scrotum and through his left groin.  Reports nausea without vomiting.  He denies diarrhea.  No urinary complaints such as dysuria or urinary frequency.  No fevers or chills.  No chest pain shortness of breath.  Nothing worsens his pain.  Nothing improves his pain.  His pain is moderate to severe in severity at this time   Past Medical History  Diagnosis Date  . GERD (gastroesophageal reflux disease)   . History of kidney stones   . Seizures Childhood  . Depression     and panic/anxiety  . Insomnia   . Kidney stones    Past Surgical History  Procedure Laterality Date  . Lithotripsy      four times over the years; most recent 10/08/12  . Appendectomy  2011  . Laparoscopic cholecystectomy  1996  . Tonsillectomy  1992  . Wrist ganglion excision  2002    Right  . Hydrocele excision Left 06/2012  . Orchiectomy Left 11/2012   Family History  Problem Relation Age of Onset  . Stroke Mother   . Liver disease Mother   . Irritable bowel syndrome Mother   . Kidney disease Mother   . Heart disease Father   . Colon polyps Father   . Colon cancer Neg Hx     History  Substance Use Topics  . Smoking status: Never Smoker   . Smokeless tobacco: Never Used  . Alcohol Use: Yes     Comment: rarely    Review of Systems  All other systems reviewed and are negative.     Allergies  Review of patient's allergies indicates no known allergies.  Home Medications   Prior to Admission medications   Medication Sig Start Date End Date Taking? Authorizing Provider  docusate sodium (COLACE) 100 MG capsule Take 1 capsule (100 mg total) by mouth 2 (two) times daily. 09/26/13  Yes Tonia Ghent, MD  escitalopram (LEXAPRO) 20 MG tablet Take 1 tablet (20 mg total) by mouth daily. 09/05/13  Yes Tonia Ghent, MD  fentaNYL (DURAGESIC - DOSED MCG/HR) 25 MCG/HR patch Place 25 mcg onto the skin every 3 (three) days.   Yes Historical Provider, MD  HYDROcodone-acetaminophen (NORCO) 10-325 MG per tablet Take 1.5 tablets by mouth every 6 (six) hours as needed for moderate pain or severe pain. 09/26/13  Yes Tonia Ghent, MD  HYDROmorphone (DILAUDID) 4 MG tablet Take 0.5-1 tablets (2-4 mg total) by mouth every 6 (six) hours as needed for severe pain. 09/19/13  Yes Tonia Ghent,  MD  ibuprofen (ADVIL,MOTRIN) 200 MG tablet Take 600-800 mg by mouth every 6 (six) hours as needed for mild pain or moderate pain.    Yes Historical Provider, MD  indapamide (LOZOL) 2.5 MG tablet Take 2.5 mg by mouth every morning.    Yes Historical Provider, MD  ondansetron (ZOFRAN-ODT) 4 MG disintegrating tablet Take 4 mg by mouth every 4 (four) hours as needed for nausea or vomiting.   Yes Historical Provider, MD  pantoprazole (PROTONIX) 40 MG tablet Take 40 mg by mouth 2 (two) times daily.   Yes Historical Provider, MD  polyethylene glycol powder (GLYCOLAX/MIRALAX) powder Take 17 g by mouth 2 (two) times daily as needed. 09/26/13  Yes Tonia Ghent, MD  potassium chloride (K-DUR) 10 MEQ tablet Take 10 mEq by mouth 2 (two) times daily.   Yes Historical Provider, MD  zolpidem (AMBIEN) 10 MG  tablet Take 1 tablet (10 mg total) by mouth at bedtime. Fill on/after 10/05/13 09/26/13  Yes Tonia Ghent, MD   BP 125/71  Pulse 67  Temp(Src) 98.3 F (36.8 C) (Oral)  Resp 16  SpO2 94% Physical Exam  Nursing note and vitals reviewed. Constitutional: He is oriented to person, place, and time. He appears well-developed and well-nourished.  HENT:  Head: Normocephalic and atraumatic.  Eyes: EOM are normal.  Neck: Normal range of motion.  Cardiovascular: Normal rate, regular rhythm, normal heart sounds and intact distal pulses.   Pulmonary/Chest: Effort normal and breath sounds normal. No respiratory distress.  Abdominal: Soft. He exhibits no distension. There is no tenderness.  Genitourinary:  Circumcised penis.  No penile tenderness.  Normal-appearing scrotum.  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  No tenderness of right testicle.  No inguinal swelling or mass.  Musculoskeletal: Normal range of motion.  Neurological: He is alert and oriented to person, place, and time.  Skin: Skin is warm and dry.  Psychiatric: He has a normal mood and affect. Judgment normal.    ED Course  Procedures (including critical care time) Labs Review Labs Reviewed  CBC - Abnormal; Notable for the following:    WBC 10.9 (*)    All other components within normal limits  URINALYSIS, ROUTINE W REFLEX MICROSCOPIC  BASIC METABOLIC PANEL    Imaging Review US Renal  10/12/2013   CLINICAL DATA:  Left back and flank pain.  EXAM: RENAL/URINARY TRACT ULTRASOUND COMPLETE  COMPARISON:  Renal ultrasound 09/18/2013  FINDINGS: Right Kidney:  Length: 12.4 cm. Echogenicity within normal limits. No mass or hydronephrosis visualized. There is a 5 mm nonobstructing stone within superior pole.  Left Kidney:  Length: 12.0 cm. Echogenicity within normal limits. No mass or hydronephrosis visualized. There is a nonobstructing 7 mm stone within the lower pole.  Bladder:  Appears normal for degree of bladder  distention.  IMPRESSION: Small bilateral nonobstructing renal stones.  No hydronephrosis   Electronically Signed   By: Lovey Newcomer M.D.   On: 10/12/2013 19:18     EKG Interpretation   Date/Time:  Saturday October 12 2013 18:08:09 EDT Ventricular Rate:  60 PR Interval:  156 QRS Duration: 101 QT Interval:  398 QTC Calculation: 398 R Axis:   3 Text Interpretation:  Sinus rhythm No old tracing to compare Confirmed by  Brittanyann Wittner  MD, Lennette Bihari ([id number]) on 10/12/2013 6:13:27 PM      MDM   Final diagnoses:  Abdominal pain, unspecified abdominal location    This appears to be an exacerbation of chronic pain.  The patient is  a very complex pain history.  He is being followed by pain management but is having some exacerbations of his chronic pain medicine.  Renal ultrasound labs and urine are without significant abnormality.  I do not think the patient needs a CT scan of his abdomen at this time.  He is feeling better and we discharged home in good condition.  Renal ultrasound demonstrates nonobstructing renal stones and no hydronephrosis.  This does not appear to be an infectious process.  His pain was managed in the emergency department with hydromorphone.  Improvement in his symptoms while in the ER.  I recommended ongoing follow up with his primary care physician.  The stomach thinking for CT scan of his abdomen.  He is feeling better.  He is ambulatory.  Discharge home in good condition.  Hoy Morn, MD 10/15/13 [phone]

## 2013-10-17 ENCOUNTER — Encounter: Payer: Self-pay | Admitting: Family Medicine

## 2013-10-18 ENCOUNTER — Other Ambulatory Visit: Payer: Self-pay | Admitting: Family Medicine

## 2013-10-18 ENCOUNTER — Encounter (HOSPITAL_COMMUNITY): Payer: Self-pay | Admitting: Emergency Medicine

## 2013-10-18 ENCOUNTER — Emergency Department (HOSPITAL_COMMUNITY)
Admission: EM | Admit: 2013-10-18 | Discharge: 2013-10-19 | Disposition: A | Discharge status: Home / Self Care | Payer: BC Managed Care – PPO | Attending: Emergency Medicine | Admitting: Emergency Medicine

## 2013-10-18 DIAGNOSIS — G40909 Epilepsy, unspecified, not intractable, without status epilepticus: Secondary | ICD-10-CM | POA: Insufficient documentation

## 2013-10-18 DIAGNOSIS — R109 Unspecified abdominal pain: Secondary | ICD-10-CM | POA: Diagnosis not present

## 2013-10-18 DIAGNOSIS — G8929 Other chronic pain: Secondary | ICD-10-CM | POA: Insufficient documentation

## 2013-10-18 DIAGNOSIS — K219 Gastro-esophageal reflux disease without esophagitis: Secondary | ICD-10-CM | POA: Insufficient documentation

## 2013-10-18 DIAGNOSIS — G4700 Insomnia, unspecified: Secondary | ICD-10-CM | POA: Insufficient documentation

## 2013-10-18 DIAGNOSIS — R350 Frequency of micturition: Secondary | ICD-10-CM | POA: Diagnosis not present

## 2013-10-18 DIAGNOSIS — N50819 Testicular pain, unspecified: Secondary | ICD-10-CM

## 2013-10-18 DIAGNOSIS — R300 Dysuria: Secondary | ICD-10-CM | POA: Insufficient documentation

## 2013-10-18 DIAGNOSIS — Z87442 Personal history of urinary calculi: Secondary | ICD-10-CM | POA: Diagnosis not present

## 2013-10-18 DIAGNOSIS — Z79899 Other long term (current) drug therapy: Secondary | ICD-10-CM | POA: Insufficient documentation

## 2013-10-18 DIAGNOSIS — F3289 Other specified depressive episodes: Secondary | ICD-10-CM | POA: Diagnosis not present

## 2013-10-18 DIAGNOSIS — Z9079 Acquired absence of other genital organ(s): Secondary | ICD-10-CM | POA: Insufficient documentation

## 2013-10-18 DIAGNOSIS — F329 Major depressive disorder, single episode, unspecified: Secondary | ICD-10-CM | POA: Diagnosis not present

## 2013-10-18 DIAGNOSIS — F410 Panic disorder [episodic paroxysmal anxiety] without agoraphobia: Secondary | ICD-10-CM | POA: Diagnosis not present

## 2013-10-18 DIAGNOSIS — N509 Disorder of male genital organs, unspecified: Secondary | ICD-10-CM | POA: Insufficient documentation

## 2013-10-18 DIAGNOSIS — Z9089 Acquired absence of other organs: Secondary | ICD-10-CM | POA: Insufficient documentation

## 2013-10-18 LAB — URINALYSIS, ROUTINE W REFLEX MICROSCOPIC
Bilirubin Urine: NEGATIVE
Glucose, UA: NEGATIVE mg/dL
Hgb urine dipstick: NEGATIVE
Ketones, ur: NEGATIVE mg/dL
Leukocytes, UA: NEGATIVE
Nitrite: NEGATIVE
Protein, ur: NEGATIVE mg/dL
Specific Gravity, Urine: 1.029 (ref 1.005–1.030)
Urobilinogen, UA: 0.2 mg/dL (ref 0.0–1.0)
pH: 5.5 (ref 5.0–8.0)

## 2013-10-18 LAB — I-STAT CHEM 8, ED
BUN: 17 mg/dL (ref 6–23)
Calcium, Ion: 1.2 mmol/L (ref 1.12–1.23)
Chloride: 99 mEq/L (ref 96–112)
Creatinine, Ser: 1 mg/dL (ref 0.50–1.35)
Glucose, Bld: 115 mg/dL — ABNORMAL HIGH (ref 70–99)
HCT: 50 % (ref 39.0–52.0)
Hemoglobin: 17 g/dL (ref 13.0–17.0)
Potassium: 3.7 mEq/L (ref 3.7–5.3)
Sodium: 140 mEq/L (ref 137–147)
TCO2: 27 mmol/L (ref 0–100)

## 2013-10-18 MED ORDER — SODIUM CHLORIDE 0.9 % IV SOLN
Freq: Once | INTRAVENOUS | Status: AC
  Administered 2013-10-18: via INTRAVENOUS

## 2013-10-18 MED ORDER — LORAZEPAM 2 MG/ML IJ SOLN
1.0000 mg | Freq: Once | INTRAMUSCULAR | Status: AC
  Administered 2013-10-19: 1 mg via INTRAVENOUS
  Filled 2013-10-18: qty 1

## 2013-10-18 MED ORDER — HYDROMORPHONE HCL 1 MG/ML IJ SOLN
1.0000 mg | Freq: Once | INTRAMUSCULAR | Status: AC
  Administered 2013-10-19: 1 mg via INTRAVENOUS
  Filled 2013-10-18: qty 1

## 2013-10-18 MED ORDER — HYDROMORPHONE HCL 1 MG/ML IJ SOLN
1.0000 mg | Freq: Once | INTRAMUSCULAR | Status: AC
  Administered 2013-10-18: 1 mg via INTRAVENOUS
  Filled 2013-10-18: qty 1

## 2013-10-18 MED ORDER — HYDROCODONE-ACETAMINOPHEN 10-325 MG PO TABS: 1.5000 | ORAL_TABLET | Freq: Four times a day (QID) | ORAL | Status: DC | PRN

## 2013-10-18 NOTE — ED Provider Notes (Signed)
CSN: [id number]     Arrival date & time 10/18/13  2048 History   First MD Initiated Contact with Patient 10/18/13 2253     Chief Complaint  Patient presents with  . Flank Pain     (Consider location/radiation/quality/duration/timing/severity/associated sxs/prior Treatment) HPI Casey Caldwell is a 33 y.o. male who presents today with complaint of groin pain and flank pain. Patient has an extensive history of kidney stones and treatments including ESWL and stent placements for past kidney stones. Currently on potassium chloride for kidney stones. Notes dysuria, frequency and right testicular pain at this time. Also has a history of sciatica with bulging discs. Denies gross hematuria and N/V.   Past Medical History  Diagnosis Date  . GERD (gastroesophageal reflux disease)   . History of kidney stones   . Seizures Childhood  . Depression     and panic/anxiety  . Insomnia   . Kidney stones    Past Surgical History  Procedure Laterality Date  . Lithotripsy      four times over the years; most recent 10/08/12  . Appendectomy  2011  . Laparoscopic cholecystectomy  1996  . Tonsillectomy  1992  . Wrist ganglion excision  2002    Right  . Hydrocele excision Left 06/2012  . Orchiectomy Left 11/2012   Family History  Problem Relation Age of Onset  . Stroke Mother   . Liver disease Mother   . Irritable bowel syndrome Mother   . Kidney disease Mother   . Heart disease Father   . Colon polyps Father   . Colon cancer Neg Hx    History  Substance Use Topics  . Smoking status: Never Smoker   . Smokeless tobacco: Never Used  . Alcohol Use: Yes     Comment: rarely    Review of Systems  Ten systems reviewed and are negative for acute change, except as noted in the HPI.    Allergies  Tape  Home Medications   Prior to Admission medications   Medication Sig Start Date End Date Taking? Authorizing Provider  docusate sodium (COLACE) 100 MG capsule Take 1 capsule (100 mg total) by  mouth 2 (two) times daily. 09/26/13  Yes Tonia Ghent, MD  escitalopram (LEXAPRO) 20 MG tablet Take 1 tablet (20 mg total) by mouth daily. 09/05/13  Yes Tonia Ghent, MD  fentaNYL (DURAGESIC - DOSED MCG/HR) 25 MCG/HR patch Place 25 mcg onto the skin every 3 (three) days.   Yes Historical Provider, MD  HYDROcodone-acetaminophen (NORCO) 10-325 MG per tablet Take 1 tablet by mouth every 6 (six) hours as needed (chronic groin & kidney stone pain).   Yes Historical Provider, MD  HYDROmorphone (DILAUDID) 4 MG tablet Take 2 mg by mouth every 6 (six) hours as needed for severe pain (chronic groin & kidney stone pain).   Yes Historical Provider, MD  ibuprofen (ADVIL,MOTRIN) 200 MG tablet Take 800 mg by mouth every 6 (six) hours as needed for mild pain or moderate pain (chronic groin & kidney pain).    Yes Historical Provider, MD  indapamide (LOZOL) 2.5 MG tablet Take 2.5 mg by mouth every morning.    Yes Historical Provider, MD  ondansetron (ZOFRAN-ODT) 4 MG disintegrating tablet Take 4 mg by mouth every 4 (four) hours as needed for nausea or vomiting (nausea).    Yes Historical Provider, MD  pantoprazole (PROTONIX) 40 MG tablet Take 40 mg by mouth 2 (two) times daily.   Yes Historical Provider, MD  polyethylene glycol (  MIRALAX / GLYCOLAX) packet Take 17 g by mouth 2 (two) times daily as needed (constipation).   Yes Historical Provider, MD  potassium chloride (K-DUR) 10 MEQ tablet Take 10 mEq by mouth 2 (two) times daily.   Yes Historical Provider, MD  zolpidem (AMBIEN) 10 MG tablet Take 1 tablet (10 mg total) by mouth at bedtime. Fill on/after 10/05/13 09/26/13  Yes Tonia Ghent, MD  polyethylene glycol powder (GLYCOLAX/MIRALAX) powder Take 17 g by mouth 2 (two) times daily as needed. 09/26/13   Tonia Ghent, MD   BP 134/82  Pulse 73  Temp(Src) 97.8 F (36.6 C)  Resp 20  Ht 6' (1.829 m)  Wt 285 lb (129.275 kg)  BMI 38.64 kg/m2  SpO2 98% Physical Exam  Nursing note and vitals reviewed.   Constitutional: He is oriented to person, place, and time. He appears well-developed and well-nourished.  HENT:  Head: Normocephalic and atraumatic.  Eyes: EOM are normal.  Neck: Normal range of motion.  Cardiovascular: Normal rate, regular rhythm, normal heart sounds and intact distal pulses.  Pulmonary/Chest: Effort normal and breath sounds normal. No respiratory distress.  Abdominal: Soft. He exhibits no distension. There is no tenderness.  Genitourinary:  Circumcised penis. No penile tenderness. Normal-appearing scrotum. 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 No tenderness of right testicle. No inguinal swelling or mass.  Musculoskeletal: Normal range of motion.  Neurological: He is alert and oriented to person, place, and time.  Skin: Skin is warm and dry.  Psychiatric: He has a normal mood and affect. Judgment normal.     ED Course  Procedures (including critical care time) Labs Review Labs Reviewed - No data to display  Imaging Review No results found.   EKG Interpretation None      MDM   Final diagnoses:  Abdominal pain, unspecified abdominal location  Chronic pain in testicle  Chronic left flank pain    Patient with chronic pain. No hematuria or UTI. Chem 8 shows no elevation in creatinine. ' Acute pain addressed in the ED. Negative renal US on  9/19. D/c, f.u with Pcp. The patient appears reasonably screened and/or stabilized for discharge and I doubt any other medical condition or other [facility] requiring further screening, evaluation, or treatment in the ED at this time prior to discharge.      Margarita Mail, PA-C 10/22/13 [phone]

## 2013-10-18 NOTE — ED Notes (Signed)
Initial Contact - Pt states that he has been to the ED usually once a week b/c of kidney stones, none which have passed. Pt states that he had a femoral nerve block on 9/8 for chronic groin pain. Pt has taken 2mg  of hydromorphone around 1900, 5mg  hydrocodone around 2000, and fentanyl 78mcg/hr patch was put on 3 days ago. Pt reports little relief from pain. Pt states pain in mostly on right side, radiates to left groin. Pt c/o reports hx of chronic pain r/t kidney stones. Pt reports no difficulty with starting stream of urine, but some pain with urination.

## 2013-10-18 NOTE — ED Notes (Addendum)
Pt c/o L lower back pain worsening 2 hours ago. Pt states his Fentanyl patch, vicodin and Morphine could not control pain at home. +nausea.  Pt had femoral nerve block last week and Cortisone spinal injection 3 days ago.

## 2013-10-19 NOTE — Discharge Instructions (Signed)
Your labs and urinalysis showed no evidence of urinary infection or kidney stone.  Your kidney function is normal.  Chronic Pain Discharge Instructions  Emergency care providers appreciate that many patients coming to [place] are in severe pain and we wish to address their pain in the safest, most responsible manner.  It is important to recognize however, that the proper treatment of chronic pain differs from that of the pain of injuries and acute illnesses.  Our goal is to provide quality, safe, personalized care and we thank you for giving [place] the opportunity to serve you. The use of narcotics and related agents for chronic pain syndromes may lead to additional physical and psychological problems.  Nearly as many people die from prescription narcotics each year as die from car crashes.  Additionally, this risk is increased if such prescriptions are obtained from a variety of sources.  Therefore, only your primary care physician or a pain management specialist is able to safely treat such syndromes with narcotic medications long-term.    Documentation revealing such prescriptions have been sought from multiple sources may prohibit [place] from providing a refill or different narcotic medication.  Your name may be checked first through the [place].  This database is a record of controlled substance medication prescriptions that the patient has received.  This has been established by [facility] in an effort to eliminate the dangerous, and often life threatening, practice of obtaining multiple prescriptions from different medical providers.   If you have a chronic pain syndrome (i.e. chronic headaches, recurrent back or neck pain, dental pain, abdominal or pelvis pain without a specific diagnosis, or neuropathic pain such as fibromyalgia) or recurrent visits for the same condition without an acute diagnosis, you may be treated with non-narcotics and other non-addictive  medicines.  Allergic reactions or negative side effects that may be reported by a patient to such medications will not typically lead to the use of a narcotic analgesic or other controlled substance as an alternative.   Patients managing chronic pain with a personal physician should have provisions in place for breakthrough pain.  If you are in crisis, you should call your physician.  If your physician directs you to the emergency department, please have the doctor call and speak to our attending physician concerning your care.   When patients come to the Emergency Department (ED) with acute medical conditions in which the Emergency Department physician feels appropriate to prescribe narcotic or sedating pain medication, the physician will prescribe these in very limited quantities.  The amount of these medications will last only until you can see your primary care physician in his/her office.  Any patient who returns to the ED seeking refills should expect only non-narcotic pain medications.   In the event of an acute medical condition exists and the emergency physician feels it is necessary that the patient be given a narcotic or sedating medication -  a responsible adult driver should be present in the room prior to the medication being given by the nurse.   Prescriptions for narcotic or sedating medications that have been lost, stolen or expired will not be refilled in the Emergency Department.    Patients who have chronic pain may receive non-narcotic prescriptions until seen by their primary care physician.  It is every patients personal responsibility to maintain active prescriptions with his or her primary care physician or specialist.

## 2013-10-22 NOTE — ED Provider Notes (Signed)
Medical screening examination/treatment/procedure(s) were performed by non-physician practitioner and as supervising physician I was immediately available for consultation/collaboration.   EKG Interpretation None       Kalman Drape, MD 10/22/13 [phone]

## 2013-10-28 ENCOUNTER — Encounter (HOSPITAL_COMMUNITY): Payer: Self-pay | Admitting: Emergency Medicine

## 2013-10-28 ENCOUNTER — Emergency Department (HOSPITAL_COMMUNITY)
Admission: EM | Admit: 2013-10-28 | Discharge: 2013-10-28 | Disposition: A | Discharge status: Home / Self Care | Payer: BC Managed Care – PPO | Attending: Emergency Medicine | Admitting: Emergency Medicine

## 2013-10-28 DIAGNOSIS — K219 Gastro-esophageal reflux disease without esophagitis: Secondary | ICD-10-CM | POA: Diagnosis not present

## 2013-10-28 DIAGNOSIS — R109 Unspecified abdominal pain: Secondary | ICD-10-CM | POA: Diagnosis present

## 2013-10-28 DIAGNOSIS — IMO0002 Reserved for concepts with insufficient information to code with codable children: Secondary | ICD-10-CM

## 2013-10-28 DIAGNOSIS — Z87442 Personal history of urinary calculi: Secondary | ICD-10-CM | POA: Insufficient documentation

## 2013-10-28 DIAGNOSIS — Z8659 Personal history of other mental and behavioral disorders: Secondary | ICD-10-CM | POA: Insufficient documentation

## 2013-10-28 DIAGNOSIS — Z79899 Other long term (current) drug therapy: Secondary | ICD-10-CM | POA: Diagnosis not present

## 2013-10-28 DIAGNOSIS — N508 Other specified disorders of male genital organs: Secondary | ICD-10-CM | POA: Diagnosis not present

## 2013-10-28 DIAGNOSIS — G4700 Insomnia, unspecified: Secondary | ICD-10-CM | POA: Diagnosis not present

## 2013-10-28 LAB — URINALYSIS, ROUTINE W REFLEX MICROSCOPIC
Bilirubin Urine: NEGATIVE
Glucose, UA: NEGATIVE mg/dL
Hgb urine dipstick: NEGATIVE
Ketones, ur: NEGATIVE mg/dL
Nitrite: NEGATIVE
Protein, ur: NEGATIVE mg/dL
Specific Gravity, Urine: 1.025 (ref 1.005–1.030)
Urobilinogen, UA: 0.2 mg/dL (ref 0.0–1.0)
pH: 5.5 (ref 5.0–8.0)

## 2013-10-28 LAB — URINE MICROSCOPIC-ADD ON

## 2013-10-28 MED ORDER — PROMETHAZINE HCL 25 MG/ML IJ SOLN: 12.5000 mg | Freq: Once | INTRAMUSCULAR | Status: DC

## 2013-10-28 MED ORDER — HYDROMORPHONE HCL 2 MG/ML IJ SOLN
2.0000 mg | Freq: Once | INTRAMUSCULAR | Status: AC
  Administered 2013-10-28: 2 mg via INTRAMUSCULAR
  Filled 2013-10-28: qty 1

## 2013-10-28 MED ORDER — ONDANSETRON 8 MG PO TBDP
8.0000 mg | ORAL_TABLET | Freq: Once | ORAL | Status: AC
  Administered 2013-10-28: 8 mg via ORAL
  Filled 2013-10-28: qty 1

## 2013-10-28 MED ORDER — KETOROLAC TROMETHAMINE 30 MG/ML IJ SOLN
30.0000 mg | Freq: Once | INTRAMUSCULAR | Status: AC
Start: 2013-10-28 — End: 2013-10-28
  Administered 2013-10-28: 30 mg via INTRAMUSCULAR
  Filled 2013-10-28: qty 1

## 2013-10-28 NOTE — ED Provider Notes (Signed)
CSN: [id number]     Arrival date & time 10/28/13  1322 History   First MD Initiated Contact with Patient 10/28/13 1622     Chief Complaint  Patient presents with  . Flank Pain    left     (Consider location/radiation/quality/duration/timing/severity/associated sxs/prior Treatment) HPI Comments: Is a 33 year old male with past medical history of a chronic left testicular pain presenting to the emergency room and chief complaint of worsening left groin pain for approximately 4 hours.  The patient reports baseline discomfort 5/10, increase in pain 8/10 today. Reports taking Norco, fentnyl patch, without resolution of symptoms. Patient reports similar symptoms with previous left groin pain. He reports left Orchiectomy 2014, due to chronic left testicular pain. Urologist: Rosana Hoes, [facility]  Patient is a 33 y.o. male presenting with flank pain. The history is provided by the patient. No language interpreter was used.  Flank Pain Pertinent negatives include no chills, fever or vomiting.    Past Medical History  Diagnosis Date  . GERD (gastroesophageal reflux disease)   . History of kidney stones   . Seizures Childhood  . Depression     and panic/anxiety  . Insomnia   . Kidney stones    Past Surgical History  Procedure Laterality Date  . Lithotripsy      four times over the years; most recent 10/08/12  . Appendectomy  2011  . Laparoscopic cholecystectomy  1996  . Tonsillectomy  1992  . Wrist ganglion excision  2002    Right  . Hydrocele excision Left 06/2012  . Orchiectomy Left 11/2012   Family History  Problem Relation Age of Onset  . Stroke Mother   . Liver disease Mother   . Irritable bowel syndrome Mother   . Kidney disease Mother   . Heart disease Father   . Colon polyps Father   . Colon cancer Neg Hx    History  Substance Use Topics  . Smoking status: Never Smoker   . Smokeless tobacco: Never Used  . Alcohol Use: Yes     Comment: rarely    Review of Systems   Constitutional: Negative for fever and chills.  Gastrointestinal: Negative for vomiting, diarrhea and constipation.  Genitourinary: Positive for testicular pain. Negative for dysuria, hematuria and scrotal swelling.      Allergies  Tape  Home Medications   Prior to Admission medications   Medication Sig Start Date End Date Taking? Authorizing Provider  docusate sodium (COLACE) 100 MG capsule Take 1 capsule (100 mg total) by mouth 2 (two) times daily. 09/26/13   Tonia Ghent, MD  escitalopram (LEXAPRO) 20 MG tablet Take 1 tablet (20 mg total) by mouth daily. 09/05/13   Tonia Ghent, MD  fentaNYL (DURAGESIC - DOSED MCG/HR) 25 MCG/HR patch Place 25 mcg onto the skin every 3 (three) days.    Historical Provider, MD  HYDROcodone-acetaminophen (NORCO) 10-325 MG per tablet Take 1 tablet by mouth every 6 (six) hours as needed (chronic groin & kidney stone pain).    Historical Provider, MD  HYDROmorphone (DILAUDID) 4 MG tablet Take 2 mg by mouth every 6 (six) hours as needed for severe pain (chronic groin & kidney stone pain).    Historical Provider, MD  ibuprofen (ADVIL,MOTRIN) 200 MG tablet Take 800 mg by mouth every 6 (six) hours as needed for mild pain or moderate pain (chronic groin & kidney pain).     Historical Provider, MD  indapamide (LOZOL) 2.5 MG tablet Take 2.5 mg by mouth every morning.  Historical Provider, MD  ondansetron (ZOFRAN-ODT) 4 MG disintegrating tablet Take 4 mg by mouth every 4 (four) hours as needed for nausea or vomiting (nausea).     Historical Provider, MD  pantoprazole (PROTONIX) 40 MG tablet Take 40 mg by mouth 2 (two) times daily.    Historical Provider, MD  polyethylene glycol (MIRALAX / GLYCOLAX) packet Take 17 g by mouth 2 (two) times daily as needed (constipation).    Historical Provider, MD  polyethylene glycol powder (GLYCOLAX/MIRALAX) powder Take 17 g by mouth 2 (two) times daily as needed. 09/26/13   Tonia Ghent, MD  potassium chloride (K-DUR) 10 MEQ  tablet Take 10 mEq by mouth 2 (two) times daily.    Historical Provider, MD  zolpidem (AMBIEN) 10 MG tablet Take 1 tablet (10 mg total) by mouth at bedtime. Fill on/after 10/05/13 09/26/13   Tonia Ghent, MD   BP 151/98  Pulse 81  Temp(Src) 98.1 F (36.7 C) (Oral)  Resp 18  SpO2 100% Physical Exam  Nursing note and vitals reviewed. Constitutional: He is oriented to person, place, and time. He appears well-developed and well-nourished. No distress.  HENT:  Head: Normocephalic and atraumatic.  Eyes: EOM are normal.  Neck: Neck supple.  Cardiovascular: Normal rate and regular rhythm.   Pulmonary/Chest: Effort normal and breath sounds normal. No respiratory distress. He has no wheezes. He has no rales.  Abdominal: Soft. There is no tenderness. There is no rebound and no guarding.  Genitourinary: Penis normal. Right testis shows tenderness. Right testis shows no swelling. Circumcised.  Left testicle absent. Left scrotal tenderness. No overlying erythema. Chaperone present.   Musculoskeletal: Normal range of motion.  Neurological: He is alert and oriented to person, place, and time.  Skin: Skin is warm and dry. He is not diaphoretic.  Psychiatric: He has a normal mood and affect. His behavior is normal.    ED Course  Procedures (including critical care time) Labs Review Labs Reviewed  URINALYSIS, ROUTINE W REFLEX MICROSCOPIC - Abnormal; Notable for the following:    Color, Urine AMBER (*)    Leukocytes, UA TRACE (*)    All other components within normal limits  URINE CULTURE  URINE MICROSCOPIC-ADD ON    Imaging Review No results found.   EKG Interpretation None      MDM   Final diagnoses:  Testicular/scrotal pain   Patient presents with left scrotal pain, history of left Orchiectomy, similar to previous chronic pain episodes. Denies urinary symptoms. Patient also has a history of renal calculi, doubt stone at this time. Plan to treat symptomatically and follow up with  urology. UA without infection, culture sent. Re-eal pt reports mild resolution of symptoms, requesting more pain medication. Reevaluation patient reports moderate resolution of symptoms. Requesting to discharge home. Meds given in ED:  Medications  ketorolac (TORADOL) 30 MG/ML injection 30 mg (30 mg Intramuscular Given 10/28/13 1659)  HYDROmorphone (DILAUDID) injection 2 mg (2 mg Intramuscular Given 10/28/13 1659)  ondansetron (ZOFRAN-ODT) disintegrating tablet 8 mg (8 mg Oral Given 10/28/13 1659)  HYDROmorphone (DILAUDID) injection 2 mg (2 mg Intramuscular Given 10/28/13 1840)    Discharge Medication List as of 10/28/2013  7:05 PM         Harvie Heck, PA-C 10/30/13 0018

## 2013-10-28 NOTE — Discharge Instructions (Signed)
Call for a follow up appointment with a Family or Primary Care Provider.  Return if symptoms worsen.   Take medication as prescribed.

## 2013-10-28 NOTE — ED Notes (Signed)
Pt is calling out requesting more pain medicine, PA made aware.

## 2013-10-28 NOTE — ED Notes (Addendum)
Pt c/o left flank pain, n/v, has chronic kidney stones, was just seen on the 25th for same.

## 2013-10-30 LAB — URINE CULTURE
Colony Count: NO GROWTH
Culture: NO GROWTH

## 2013-10-31 NOTE — ED Provider Notes (Signed)
Medical screening examination/treatment/procedure(s) were performed by non-physician practitioner and as supervising physician I was immediately available for consultation/collaboration.   EKG Interpretation None        Debby Freiberg, MD 10/31/13 [phone]

## 2013-11-01 ENCOUNTER — Emergency Department (HOSPITAL_COMMUNITY)
Admission: EM | Admit: 2013-11-01 | Discharge: 2013-11-01 | Disposition: A | Discharge status: Home / Self Care | Payer: BC Managed Care – PPO | Attending: Emergency Medicine | Admitting: Emergency Medicine

## 2013-11-01 ENCOUNTER — Encounter (HOSPITAL_COMMUNITY): Payer: Self-pay | Admitting: Emergency Medicine

## 2013-11-01 DIAGNOSIS — K219 Gastro-esophageal reflux disease without esophagitis: Secondary | ICD-10-CM | POA: Insufficient documentation

## 2013-11-01 DIAGNOSIS — Z9089 Acquired absence of other organs: Secondary | ICD-10-CM | POA: Insufficient documentation

## 2013-11-01 DIAGNOSIS — M545 Low back pain: Secondary | ICD-10-CM | POA: Diagnosis not present

## 2013-11-01 DIAGNOSIS — N508 Other specified disorders of male genital organs: Secondary | ICD-10-CM | POA: Insufficient documentation

## 2013-11-01 DIAGNOSIS — G8929 Other chronic pain: Secondary | ICD-10-CM | POA: Insufficient documentation

## 2013-11-01 DIAGNOSIS — Z87442 Personal history of urinary calculi: Secondary | ICD-10-CM | POA: Diagnosis not present

## 2013-11-01 DIAGNOSIS — Z9079 Acquired absence of other genital organ(s): Secondary | ICD-10-CM | POA: Diagnosis not present

## 2013-11-01 DIAGNOSIS — Z79899 Other long term (current) drug therapy: Secondary | ICD-10-CM | POA: Insufficient documentation

## 2013-11-01 DIAGNOSIS — F329 Major depressive disorder, single episode, unspecified: Secondary | ICD-10-CM | POA: Diagnosis not present

## 2013-11-01 DIAGNOSIS — R109 Unspecified abdominal pain: Secondary | ICD-10-CM | POA: Diagnosis present

## 2013-11-01 MED ORDER — KETOROLAC TROMETHAMINE 60 MG/2ML IM SOLN
60.0000 mg | Freq: Once | INTRAMUSCULAR | Status: AC
  Administered 2013-11-01: 60 mg via INTRAMUSCULAR
  Filled 2013-11-01: qty 2

## 2013-11-01 MED ORDER — HYDROMORPHONE HCL 2 MG/ML IJ SOLN
2.0000 mg | Freq: Once | INTRAMUSCULAR | Status: AC
  Administered 2013-11-01: 2 mg via INTRAMUSCULAR
  Filled 2013-11-01: qty 1

## 2013-11-01 MED ORDER — ONDANSETRON 8 MG PO TBDP
8.0000 mg | ORAL_TABLET | Freq: Once | ORAL | Status: AC
  Administered 2013-11-01: 8 mg via ORAL
  Filled 2013-11-01: qty 1

## 2013-11-01 NOTE — Discharge Instructions (Signed)

## 2013-11-01 NOTE — ED Provider Notes (Signed)
CSN: [id number]     Arrival date & time 11/01/13  1133 History   First MD Initiated Contact with Patient 11/01/13 1156     Chief Complaint  Patient presents with  . Flank Pain     (Consider location/radiation/quality/duration/timing/severity/associated sxs/prior Treatment) Patient is a 33 y.o. male presenting with flank pain. The history is provided by the patient.  Flank Pain  \ Casey Caldwell is a 33 y.o. male who is here for evaluation of exacerbation of his chronic pain in the low back, and scrotum. Today, the pain is in the right lower back, and right testicle. He denies injury, dysuria, hematuria, or fever. He is using his usual medications without relief; which include fentanyl patch 25 mcg, and hydrocodone 40 mg each day. His medications are prescribed by a pain management doctor in [place], [place]. The patient has had numerous renal ultrasounds within the last year to evaluate for obstructive urolithiasis. He has not been found on any of these images. He also had an MRI this year and several scrotal ultrasounds. He had his left epididymis, and left testicle removed about one year ago. Subsequent to that he developed right-sided scrotal and testicle pain. He drove his car here to be evaluated, today. There are no other known modifying factors.   Past Medical History  Diagnosis Date  . GERD (gastroesophageal reflux disease)   . History of kidney stones   . Seizures Childhood  . Depression     and panic/anxiety  . Insomnia   . Kidney stones    Past Surgical History  Procedure Laterality Date  . Lithotripsy      four times over the years; most recent 10/08/12  . Appendectomy  2011  . Laparoscopic cholecystectomy  1996  . Tonsillectomy  1992  . Wrist ganglion excision  2002    Right  . Hydrocele excision Left 06/2012  . Orchiectomy Left 11/2012   Family History  Problem Relation Age of Onset  . Stroke Mother   . Liver disease Mother   . Irritable bowel  syndrome Mother   . Kidney disease Mother   . Heart disease Father   . Colon polyps Father   . Colon cancer Neg Hx    History  Substance Use Topics  . Smoking status: Never Smoker   . Smokeless tobacco: Never Used  . Alcohol Use: Yes     Comment: rarely    Review of Systems  Genitourinary: Positive for flank pain.  All other systems reviewed and are negative.     Allergies  Tape  Home Medications   Prior to Admission medications   Medication Sig Start Date End Date Taking? Authorizing Provider  docusate sodium (COLACE) 100 MG capsule Take 1 capsule (100 mg total) by mouth 2 (two) times daily. 09/26/13   Tonia Ghent, MD  escitalopram (LEXAPRO) 20 MG tablet Take 1 tablet (20 mg total) by mouth daily. 09/05/13   Tonia Ghent, MD  fentaNYL (DURAGESIC - DOSED MCG/HR) 25 MCG/HR patch Place 25 mcg onto the skin every 3 (three) days.    Historical Provider, MD  HYDROcodone-acetaminophen (NORCO) 10-325 MG per tablet Take 1 tablet by mouth every 6 (six) hours as needed (chronic groin & kidney stone pain).    Historical Provider, MD  ibuprofen (ADVIL,MOTRIN) 200 MG tablet Take 800 mg by mouth every 6 (six) hours as needed for mild pain or moderate pain (chronic groin & kidney pain).     Historical Provider, MD  indapamide (  LOZOL) 2.5 MG tablet Take 2.5 mg by mouth every morning.     Historical Provider, MD  ondansetron (ZOFRAN-ODT) 4 MG disintegrating tablet Take 4 mg by mouth every 4 (four) hours as needed for nausea or vomiting (nausea).     Historical Provider, MD  pantoprazole (PROTONIX) 40 MG tablet Take 40 mg by mouth 2 (two) times daily.    Historical Provider, MD  polyethylene glycol (MIRALAX / GLYCOLAX) packet Take 17 g by mouth 2 (two) times daily as needed (constipation).    Historical Provider, MD  potassium chloride (K-DUR) 10 MEQ tablet Take 10 mEq by mouth 2 (two) times daily.    Historical Provider, MD  zolpidem (AMBIEN) 10 MG tablet Take 1 tablet (10 mg total) by mouth  at bedtime. Fill on/after 10/05/13 09/26/13   Tonia Ghent, MD   BP 136/79  Pulse 84  Temp(Src) 97.5 F (36.4 C) (Oral)  Resp 18  SpO2 98% Physical Exam  Nursing note and vitals reviewed. Constitutional: He is oriented to person, place, and time. He appears well-developed and well-nourished.  Dramatic posture- he is sprawled on the bed, with his feet on the floor, prone position  HENT:  Head: Normocephalic and atraumatic.  Right Ear: External ear normal.  Left Ear: External ear normal.  Eyes: Conjunctivae and EOM are normal. Pupils are equal, round, and reactive to light.  Neck: Normal range of motion and phonation normal. Neck supple.  Cardiovascular: Normal rate.   Pulmonary/Chest: Effort normal. He exhibits no bony tenderness.  Genitourinary:  Penis normal. Right testicle tender to palpation. It is not enlarged. 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 Scrotum appears normal. He is tenderness to palpation, of the left scrotum. There is no groin mass or adenopathy.  Musculoskeletal: Normal range of motion.  Neurological: He is alert and oriented to person, place, and time. No cranial nerve deficit or sensory deficit. He exhibits normal muscle tone. Coordination normal.  Skin: Skin is warm, dry and intact.  Psychiatric: He has a normal mood and affect. His behavior is normal. Judgment and thought content normal.    ED Course  Procedures (including critical care time)  Medications  HYDROmorphone (DILAUDID) injection 2 mg (not administered)  ketorolac (TORADOL) injection 60 mg (not administered)  ondansetron (ZOFRAN-ODT) disintegrating tablet 8 mg (not administered)    Patient Vitals for the past 24 hrs:  BP Temp Temp src Pulse Resp SpO2  11/01/13 1137 136/79 mmHg 97.5 F (36.4 C) Oral 84 18 98 %    3:10 PM Reevaluation with update and discussion. After initial assessment and treatment, an updated evaluation reveals pain is better and he is resting comfortably. Findings discussed with  patient, all questions answered. [place] Review Labs Reviewed - No data to display  Imaging Review No results found.   EKG Interpretation None      MDM   Final diagnoses:  Chronic pain    Chronic pain, with exacerbation, recurrent, with frequent emergency department visits. I doubt cauda equina syndrome, ureteral stone, or serious bacterial infection  Nursing Notes Reviewed/ Care Coordinated Applicable Imaging Reviewed Interpretation of Laboratory Data incorporated into ED treatment  The patient appears reasonably screened and/or stabilized for discharge and I doubt any other medical condition or other [facility] requiring further screening, evaluation, or treatment in the ED at this time prior to discharge.  Plan: Home Medications- usual; Home Treatments- rest; return here if the recommended treatment, does not improve the symptoms; Recommended follow up- PCP prn  Richarda Blade, MD 11/01/13 [phone]

## 2013-11-01 NOTE — ED Notes (Signed)
Pt c/o lt flank pain radiating into groin from kidney stone x 1 hr.

## 2013-11-11 ENCOUNTER — Encounter: Payer: Self-pay | Admitting: Family Medicine

## 2013-11-11 ENCOUNTER — Ambulatory Visit: Payer: BC Managed Care – PPO | Admitting: Family Medicine

## 2013-11-11 ENCOUNTER — Ambulatory Visit (INDEPENDENT_AMBULATORY_CARE_PROVIDER_SITE_OTHER): Payer: BC Managed Care – PPO | Admitting: Family Medicine

## 2013-11-11 VITALS — BP 128/90 | HR 68 | Temp 98.2°F | Wt 286.8 lb

## 2013-11-11 DIAGNOSIS — R1032 Left lower quadrant pain: Secondary | ICD-10-CM

## 2013-11-11 MED ORDER — ZOLPIDEM TARTRATE 10 MG PO TABS: 10.0000 mg | ORAL_TABLET | Freq: Every day | ORAL | Status: DC

## 2013-11-11 MED ORDER — HYDROMORPHONE HCL 2 MG PO TABS: 2.0000 mg | ORAL_TABLET | ORAL | Status: DC | PRN

## 2013-11-11 MED ORDER — OXYCODONE-ACETAMINOPHEN 10-325 MG PO TABS: ORAL_TABLET | ORAL | Status: DC

## 2013-11-11 NOTE — Progress Notes (Signed)
Pre visit review using our clinic review tool, if applicable. No additional management support is needed unless otherwise documented below in the visit note.  He has f/u with pain clinic on Wednesday re: back and groin pain, no change in character of pain recently.  He was going to ask about increasing the patch dose at the pain clinic visit.  The possible spinal cord stimulator placement would be through the pain clinic.  He had psych eval about that, as a preop.  He passed the psych eval for the SCS.  He is looking into counseling.  He is worried, tearful, irritable.  He has been out of work from the pain. This has been disruptive and debilitating.  Mult ER visits for pain.  He had used dilaudid prev by mouth, and that did keep him of the ER a few times.  The mood changes are directly pain related per patient report.  No SI/HI.  He is overall better on lexapro than before it was added, ie he can see some benefit from the med.    He was asking about changing hydrocodone for oxycodone.  He's taking 50mg  of hydrocodone a day, 10mg  per dose, w/o sedation.  Still with constipation.  He is working on taking bowel regimen, "but some days I don't keep up with it and then I have trouble."  D/w pt about bowel regimen adherence.   Meds, vitals, and allergies reviewed.   ROS: See HPI.  Otherwise, noncontributory.  Uncomfortable.  Looks tired but not sedated.   Affect flatter than typical, compared to prev.   Mmm rrr ctab abd soft, not ttp Ext w/o edema

## 2013-11-11 NOTE — Assessment & Plan Note (Addendum)
Chronic pain, with eval for spinal cord stimulator pending.  He passed the psychology eval.  His mood changes seem to be directly related to the pain, which would be understandable.  dw pt, no SI/HI.  Will change to oxycodone with goal of 3-4 pills per day, okay to start with a half tab per dose. Max 5 pills per day. He is going to ask pain clinic about upping his fentanyl patch.  I will defer to the pain clinic on that and the SCS.  I did fill his dilaudid.  He had trouble getting 2 mg pills earlier, due to availability per patient report- see note on rx.  We elected not to change his antidepressant today as the pain medicine was a more pressing issue.  He agrees. I did refill his Lorrin Mais in the meantime, to be filled later.  App help of all involved.

## 2013-11-11 NOTE — Patient Instructions (Signed)
Change to oxycodone.  Use dilaudid to try to prevent an ER trip.  Ask the pain clinic about the SCS and the patch dose.  If the changes don't help the pain enough to lift your mood, then notify me.  Please give me an update next week either way.

## 2013-11-18 ENCOUNTER — Encounter (HOSPITAL_COMMUNITY): Payer: Self-pay | Admitting: Emergency Medicine

## 2013-11-18 ENCOUNTER — Emergency Department (HOSPITAL_COMMUNITY)
Admission: EM | Admit: 2013-11-18 | Discharge: 2013-11-18 | Discharge status: Against Medical Advice | Payer: BC Managed Care – PPO | Attending: Emergency Medicine | Admitting: Emergency Medicine

## 2013-11-18 DIAGNOSIS — Z87442 Personal history of urinary calculi: Secondary | ICD-10-CM | POA: Diagnosis not present

## 2013-11-18 DIAGNOSIS — F419 Anxiety disorder, unspecified: Secondary | ICD-10-CM | POA: Insufficient documentation

## 2013-11-18 DIAGNOSIS — G4700 Insomnia, unspecified: Secondary | ICD-10-CM | POA: Diagnosis not present

## 2013-11-18 DIAGNOSIS — R109 Unspecified abdominal pain: Secondary | ICD-10-CM | POA: Diagnosis present

## 2013-11-18 DIAGNOSIS — Z79899 Other long term (current) drug therapy: Secondary | ICD-10-CM | POA: Diagnosis not present

## 2013-11-18 DIAGNOSIS — G8929 Other chronic pain: Secondary | ICD-10-CM | POA: Diagnosis not present

## 2013-11-18 DIAGNOSIS — F329 Major depressive disorder, single episode, unspecified: Secondary | ICD-10-CM | POA: Diagnosis not present

## 2013-11-18 DIAGNOSIS — M549 Dorsalgia, unspecified: Secondary | ICD-10-CM | POA: Insufficient documentation

## 2013-11-18 DIAGNOSIS — M791 Myalgia: Secondary | ICD-10-CM | POA: Insufficient documentation

## 2013-11-18 DIAGNOSIS — K219 Gastro-esophageal reflux disease without esophagitis: Secondary | ICD-10-CM | POA: Diagnosis not present

## 2013-11-18 LAB — URINALYSIS, ROUTINE W REFLEX MICROSCOPIC
Bilirubin Urine: NEGATIVE
Glucose, UA: NEGATIVE mg/dL
Hgb urine dipstick: NEGATIVE
Ketones, ur: NEGATIVE mg/dL
Leukocytes, UA: NEGATIVE
Nitrite: NEGATIVE
Protein, ur: NEGATIVE mg/dL
Specific Gravity, Urine: 1.021 (ref 1.005–1.030)
Urobilinogen, UA: 0.2 mg/dL (ref 0.0–1.0)
pH: 7.5 (ref 5.0–8.0)

## 2013-11-18 NOTE — ED Provider Notes (Signed)
CSN: [id number]     Arrival date & time 11/18/13  1503 History   First MD Initiated Contact with Patient 11/18/13 1651     Chief Complaint  Patient presents with  . Flank Pain     (Consider location/radiation/quality/duration/timing/severity/associated sxs/prior Treatment) HPI Casey Caldwell is a 33 y.o. male who presents to ED with complaint of chronic back pain. Pt states he has chronic pain. He is followed by pain management in [place]. States also has hx of kidney stones. Pt states he is currently on fentanyl patch, percocet 10 that he takes every 4hrs, dilaudid 2mg  that he takes every 4 hrs. States it is not helping his pain. States today pain is worse. States "normally when my pain is that bad I get 2mg  of dilaudid iv, several rounds, and go home and take my pain medications at home." Pt denies new injuries. No numbness or weakness in extremities. No fever or chills. No hemauturia.   Past Medical History  Diagnosis Date  . GERD (gastroesophageal reflux disease)   . History of kidney stones   . Seizures Childhood  . Depression     and panic/anxiety  . Insomnia   . Kidney stones    Past Surgical History  Procedure Laterality Date  . Lithotripsy      four times over the years; most recent 10/08/12  . Appendectomy  2011  . Laparoscopic cholecystectomy  1996  . Tonsillectomy  1992  . Wrist ganglion excision  2002    Right  . Hydrocele excision Left 06/2012  . Orchiectomy Left 11/2012   Family History  Problem Relation Age of Onset  . Stroke Mother   . Liver disease Mother   . Irritable bowel syndrome Mother   . Kidney disease Mother   . Heart disease Father   . Colon polyps Father   . Colon cancer Neg Hx    History  Substance Use Topics  . Smoking status: Never Smoker   . Smokeless tobacco: Never Used  . Alcohol Use: Yes     Comment: rarely    Review of Systems  Constitutional: Negative for fever and chills.  Respiratory: Negative for cough, chest tightness  and shortness of breath.   Cardiovascular: Negative for chest pain, palpitations and leg swelling.  Gastrointestinal: Negative for nausea, vomiting, abdominal pain, diarrhea and abdominal distention.  Genitourinary: Negative for dysuria, urgency, frequency and hematuria.  Musculoskeletal: Positive for arthralgias, back pain and myalgias. Negative for neck pain and neck stiffness.  Skin: Negative for rash.  Allergic/Immunologic: Negative for immunocompromised state.  Neurological: Negative for dizziness, weakness, light-headedness, numbness and headaches.      Allergies  Tape and Wellbutrin  Home Medications   Prior to Admission medications   Medication Sig Start Date End Date Taking? Authorizing Provider  docusate sodium (COLACE) 100 MG capsule Take 1 capsule (100 mg total) by mouth 2 (two) times daily. 09/26/13   Tonia Ghent, MD  escitalopram (LEXAPRO) 20 MG tablet Take 20 mg by mouth at bedtime.    Historical Provider, MD  fentaNYL (DURAGESIC - DOSED MCG/HR) 25 MCG/HR patch Place 25 mcg onto the skin every 3 (three) days.    Historical Provider, MD  HYDROmorphone (DILAUDID) 2 MG tablet Take 1 tablet (2 mg total) by mouth every 4 (four) hours as needed for severe pain. Sedation caution.  Okay to fill with 1mg  tabs if needed, still with 2mg  per dose.  Call if questions. 11/11/13   Tonia Ghent, MD  ibuprofen (ADVIL,MOTRIN) 200 MG tablet Take 800 mg by mouth every 6 (six) hours as needed for mild pain or moderate pain (chronic groin & kidney pain).     Historical Provider, MD  indapamide (LOZOL) 2.5 MG tablet Take 2.5 mg by mouth every morning.     Historical Provider, MD  ondansetron (ZOFRAN-ODT) 4 MG disintegrating tablet Take 4 mg by mouth every 4 (four) hours as needed for nausea or vomiting (nausea).     Historical Provider, MD  oxyCODONE-acetaminophen (PERCOCET) 10-325 MG per tablet Take 0.5-1 tab every 4 hours as needed for pain.  Start with 0.5 tab.  Max 5 doses per day, goal  max 3-4 tabs per day 11/11/13   Tonia Ghent, MD  pantoprazole (PROTONIX) 40 MG tablet Take 40 mg by mouth 2 (two) times daily.    Historical Provider, MD  polyethylene glycol (MIRALAX / GLYCOLAX) packet Take 17 g by mouth 2 (two) times daily as needed (constipation).    Historical Provider, MD  potassium chloride (K-DUR) 10 MEQ tablet Take 10 mEq by mouth 2 (two) times daily.    Historical Provider, MD  zolpidem (AMBIEN) 10 MG tablet Take 1 tablet (10 mg total) by mouth at bedtime. Fill on/after 12/03/13 11/11/13   Tonia Ghent, MD   BP 154/100  Pulse 99  Temp(Src) 97.6 F (36.4 C) (Oral)  Resp 18  SpO2 99% Physical Exam  Nursing note and vitals reviewed. Constitutional: He appears well-developed and well-nourished. No distress.  HENT:  Head: Normocephalic and atraumatic.  Eyes: Conjunctivae are normal.  Neck: Neck supple.  Cardiovascular: Normal rate, regular rhythm and normal heart sounds.   Pulmonary/Chest: Effort normal. No respiratory distress. He has no wheezes. He has no rales.  Musculoskeletal: He exhibits no edema.  Neurological: He is alert.  Skin: Skin is warm and dry.    ED Course  Procedures (including critical care time) Labs Review Labs Reviewed  URINALYSIS, ROUTINE W REFLEX MICROSCOPIC    Imaging Review No results found.   EKG Interpretation None      MDM   Final diagnoses:  Chronic pain    Pt here with chronic pain, reports already taking percocet 10s every 4 hrs, fentanyl patch, dilaudid 2mg  taps. Continues to have pain. UA negative. Pt with multiple visits to ED for the same. Pt requesting IV dilaudid 2mg  doses. I explained to pt we will not be doing IV today for his chronic pain. He has had multiple USs and CTs, all of which show non obstructive stones. Pt also reports chronic back pain, waiting on get approval from insurance company for spinal stimulator. No incontinence. Afebrile. Ambulating without difficulty.   PT is morbidly obese, on  max pain medications. i did sit down and discussed with him other options to help his chronic back pain, including massage, stretching, excercises, and weight loss. I offered pt IM dose of dilaudid and toradol. Pt did not like my offer, and walked out of ED cursing, stating that i was rude, and I told him to lose weight, and that he waited so long for nothing. Pt was so loud and arrogant, security was called and escorted him out.   Filed Vitals:   11/18/13 1514  BP: 154/100  Pulse: 99  Temp: 97.6 F (36.4 C)  TempSrc: Oral  Resp: 18  SpO2: 99%       Temiloluwa Recchia A Nathaneal Sommers, PA-C 11/18/13 1727

## 2013-11-18 NOTE — ED Notes (Signed)
Pt c/o lt sided flank pain from kidney stone since yesterday.  Denies NVD.

## 2013-11-18 NOTE — ED Notes (Signed)
Pt left AMA. This RN was going in to assess another pt when yelling was heard from this pt's room. Pt was cursing and yelling loudly at the PA. Pt continued to scream all the way down the hallway, escorted out by security and [employer].

## 2013-11-19 ENCOUNTER — Telehealth: Payer: Self-pay | Admitting: Family Medicine

## 2013-11-19 NOTE — ED Provider Notes (Signed)
Medical screening examination/treatment/procedure(s) were performed by non-physician practitioner and as supervising physician I was immediately available for consultation/collaboration.   EKG Interpretation None        Orpah Greek, MD 11/19/13 [phone]

## 2013-11-19 NOTE — Telephone Encounter (Signed)
Spoke to patient and was advised that he had a big issue that happened at [facility] ER yesterday. Patient stated that he went to the ER because he was in so much pain.  He waited 2 hours and saw a PA and had a bad experience with her because she told him he was taking too much medication.  Patient stated that while he was getting dressed to leave the PA called security. Patient wants to discuss this issue directly with Dr. Damita Dunnings.

## 2013-11-19 NOTE — Telephone Encounter (Signed)
Pt called in and would like for you to call him back at your earliest convinence. Thank you

## 2013-11-20 ENCOUNTER — Telehealth: Payer: Self-pay | Admitting: Family Medicine

## 2013-11-20 NOTE — Telephone Encounter (Signed)
I called and LMOVM for patient.  I'll send a [employer].

## 2013-11-20 NOTE — Telephone Encounter (Signed)
Late entry, called and talked to patient.  ER note reviewed.  At this point, I want him to talk to pain clinic to see what can be done when he has these sig pain flares.  He'll call and notify me if I can be of service.

## 2013-11-25 ENCOUNTER — Other Ambulatory Visit: Payer: Self-pay | Admitting: *Deleted

## 2013-11-25 MED ORDER — PANTOPRAZOLE SODIUM 40 MG PO TBEC: 40.0000 mg | DELAYED_RELEASE_TABLET | Freq: Two times a day (BID) | ORAL | Status: DC

## 2013-12-02 ENCOUNTER — Telehealth: Payer: Self-pay | Admitting: Family Medicine

## 2013-12-02 ENCOUNTER — Encounter: Payer: Self-pay | Admitting: Family Medicine

## 2013-12-02 MED ORDER — ESCITALOPRAM OXALATE 20 MG PO TABS: 30.0000 mg | ORAL_TABLET | Freq: Every day | ORAL | Status: DC

## 2013-12-02 NOTE — Telephone Encounter (Signed)
Called pt.  No SI/HI. Mood worse with pain and sleep disruption. He has hope for improvement with the upcoming procedure.   D/w pt.  He is looking into counseling.  Will inc lexapro to 30mg  at night.  He'll update me as needed.  He contracts for safety.

## 2013-12-13 ENCOUNTER — Other Ambulatory Visit: Payer: Self-pay

## 2013-12-13 NOTE — Telephone Encounter (Signed)
Pt left v/m requesting refill of lexapro for [facility] [address].Please advise.

## 2013-12-15 MED ORDER — ESCITALOPRAM OXALATE 20 MG PO TABS: 30.0000 mg | ORAL_TABLET | Freq: Every day | ORAL | Status: DC

## 2013-12-15 NOTE — Telephone Encounter (Signed)
Sent. Thanks.   

## 2013-12-16 ENCOUNTER — Other Ambulatory Visit: Payer: Self-pay

## 2013-12-16 MED ORDER — OXYCODONE-ACETAMINOPHEN 10-325 MG PO TABS: ORAL_TABLET | ORAL | Status: DC

## 2013-12-16 NOTE — Telephone Encounter (Signed)
Patient notified by telephone that script is up front ready for pickup. 

## 2013-12-16 NOTE — Telephone Encounter (Signed)
trial stimulator was inserted today for pain reduction; difficult to get appt with pain mgt and pt has to be seen to get med refill for pain med; was discussed with pain mgt and pt was told can get pain med from PCP; request rx for oxycodone apap. Call when ready for pick up.. Pt started taking lexapro 30 mg at night; pt's wife can see improvement since taking lexapro 30 mg and request refill with instructions taking 1 1/2 tab daily [facility] [address]. Advised Mrs Steve that lexapro 20 mg #45 with refills and new [facility] 12/15/13. Mrs Kassabian voiced understanding and will ck with pharmacy about lexapro refill.

## 2013-12-16 NOTE — Telephone Encounter (Signed)
Noted, okay, printed. Thanks.

## 2013-12-27 ENCOUNTER — Ambulatory Visit: Payer: BC Managed Care – PPO | Admitting: Family Medicine

## 2013-12-30 ENCOUNTER — Encounter: Payer: Self-pay | Admitting: Family Medicine

## 2013-12-30 ENCOUNTER — Ambulatory Visit (INDEPENDENT_AMBULATORY_CARE_PROVIDER_SITE_OTHER): Payer: BC Managed Care – PPO | Admitting: Family Medicine

## 2013-12-30 VITALS — BP 112/74 | HR 67 | Temp 98.0°F | Wt 285.5 lb

## 2013-12-30 DIAGNOSIS — R1032 Left lower quadrant pain: Secondary | ICD-10-CM

## 2013-12-30 MED ORDER — POLYETHYLENE GLYCOL 3350 17 GM/SCOOP PO POWD: 17.0000 g | Freq: Two times a day (BID) | ORAL | Status: DC | PRN

## 2013-12-30 MED ORDER — HYDROMORPHONE HCL 2 MG PO TABS: 2.0000 mg | ORAL_TABLET | ORAL | Status: DC | PRN

## 2013-12-30 MED ORDER — OXYCODONE-ACETAMINOPHEN 10-325 MG PO TABS: ORAL_TABLET | ORAL | Status: DC

## 2013-12-30 MED ORDER — ONDANSETRON 4 MG PO TBDP: 4.0000 mg | ORAL_TABLET | ORAL | Status: DC | PRN

## 2013-12-30 MED ORDER — FENTANYL 50 MCG/HR TD PT72: 50.0000 ug | MEDICATED_PATCH | TRANSDERMAL | Status: DC

## 2013-12-30 NOTE — Patient Instructions (Signed)
I'll await the pain clinic procedure next week.  I really want you to get some help.  Update me after the procedure.  Take care.  Glad to see you.

## 2013-12-30 NOTE — Progress Notes (Signed)
Pre visit review using our clinic review tool, if applicable. No additional management support is needed unless otherwise documented below in the visit note.  He is approved for his surgery (!) next week.  He had a lot of relief with the prev trial implant.  His mood was better at that point. It is upsetting that the pain is back in the meantime, but he sees "the light at the end of the tunnel."   He'll likely have a 5 week recovery period.  He wants to move the pain meds to my clinic.  I am okay with this.   He is still on a bowel regimen.   The goal is to keep him out of the ER from a pain flare in the meantime.  D/w pt.   No ADE on current pain meds or other meds.   Needs prn zofran due to pain/nausea.   Meds, vitals, and allergies reviewed.   ROS: See HPI.  Otherwise, noncontributory.  nad but tired appearing Mmm Neck supple, no LA rrr ctab abd soft not ttp Back with healing incising sites, no erythema Ext w/o edema

## 2013-12-31 NOTE — Assessment & Plan Note (Signed)
He is approved for his surgery (!) next week. He had a lot of relief with the prev trial implant. His mood was better at that point.  It is upsetting that the pain is back in the meantime, but he sees "the light at the end of the tunnel."  He'll likely have ~5 week recovery period.  He wants to move the pain meds to my clinic. I am okay with this.  He is still on a bowel regimen.  The goal is to keep him out of the ER from a pain flare in the meantime. D/w pt.  No ADE on current pain meds or other meds.  Needs prn zofran due to pain/nausea.  I refilled his meds, see EMR record.  After the procedure is done, we'll use his oxycodone use as a gauge to then likely taper his fent patch.   I did dilaudid rx today for breakthrough pain, in case it is needed to try to prevent ER visit.  >25 minutes spent in face to face time with patient, >50% spent in counselling or coordination of care.

## 2014-01-30 ENCOUNTER — Telehealth: Payer: Self-pay

## 2014-01-30 NOTE — Telephone Encounter (Signed)
Pt trying to email by mychart to Dr Damita Dunnings but unable to get thru due to error message. Pt request refill on Z[facility]em [facility] to [facility] [address]. [facility] and [facility] not [facility] any [facility] so pt request [facility]. Pt request cb [id number] from Dr Damita Dunnings about another issue; pt does not have ins for month of Jan 2016. Pt wants to know if can substitute Fluoxetine which is on a $4.00 list for lexapro to [facility] [address].Please advise.

## 2014-01-31 MED ORDER — ZOLPIDEM TARTRATE 10 MG PO TABS: 10.0000 mg | ORAL_TABLET | Freq: Every day | ORAL | Status: DC

## 2014-01-31 NOTE — Telephone Encounter (Addendum)
Pt left v/m; pt request lexapro 30 mg and ambien 10 mg to [facility] on [address].Pt request to pick up oxycodone apap. Call when ready for pick up. Oxycodone apap was printed 12/30/13 with comment not to fill until 01/15/14. Ambien was called in on 01/31/2014.Please advise.

## 2014-01-31 NOTE — Telephone Encounter (Signed)
Patient advised. Medication phoned to pharmacy.  

## 2014-01-31 NOTE — Telephone Encounter (Signed)
If this is going to be a one month or relatively short term issue, then I wouldn't change the lexapro.  I would have him price check that (ie at [place]) to see if it is cheaper there.   Please call in the [facility].   Thanks.

## 2014-02-03 MED ORDER — ESCITALOPRAM OXALATE 20 MG PO TABS: 30.0000 mg | ORAL_TABLET | Freq: Every day | ORAL | Status: DC

## 2014-02-03 NOTE — Telephone Encounter (Addendum)
The Casey Caldwell was recently called in, so please cancel that rx and call it in at [facility].  lexapro sent.   It's too early on the oxycodone, I didn't print it.  How is his pain situation currently?

## 2014-02-03 NOTE — Addendum Note (Signed)
Addended by: Tonia Ghent on: 02/03/2014 06:02 AM   Modules accepted: Orders

## 2014-02-03 NOTE — Telephone Encounter (Signed)
We shouldn't fill the oxycodone until closer to the point he is running out.  Have him let me know closer to that point.  Thanks.  I'll await his mychart message.

## 2014-02-03 NOTE — Telephone Encounter (Signed)
Rx was transferred from [facility] to [facility] for the Ambien.  Patient says his pain level is better.  He is going to send a Pharmacist, community message.  He knows it was too early for the Oxycodone, he was just trying to get all of his medications together????

## 2014-02-04 ENCOUNTER — Encounter: Payer: Self-pay | Admitting: Family Medicine

## 2014-02-05 ENCOUNTER — Other Ambulatory Visit: Payer: Self-pay | Admitting: Internal Medicine

## 2014-02-24 ENCOUNTER — Encounter: Payer: Self-pay | Admitting: Family Medicine

## 2014-02-24 ENCOUNTER — Other Ambulatory Visit: Payer: Self-pay | Admitting: Family Medicine

## 2014-02-24 MED ORDER — HYDROCODONE-ACETAMINOPHEN 10-325 MG PO TABS: 1.0000 | ORAL_TABLET | Freq: Four times a day (QID) | ORAL | Status: DC | PRN

## 2014-02-24 NOTE — Progress Notes (Signed)
Patient advised.  Rx left at front desk for pick up. 

## 2014-02-24 NOTE — Progress Notes (Signed)
See mychart message.  rx printed.  Thanks.

## 2014-03-26 ENCOUNTER — Encounter: Payer: Self-pay | Admitting: Family Medicine

## 2014-03-27 ENCOUNTER — Other Ambulatory Visit: Payer: Self-pay | Admitting: Family Medicine

## 2014-03-27 MED ORDER — HYDROCODONE-ACETAMINOPHEN 10-325 MG PO TABS: 1.0000 | ORAL_TABLET | Freq: Four times a day (QID) | ORAL | Status: DC | PRN

## 2014-03-27 NOTE — Progress Notes (Signed)
See my chart message

## 2014-04-10 ENCOUNTER — Other Ambulatory Visit: Payer: Self-pay

## 2014-04-10 NOTE — Telephone Encounter (Signed)
Pt left v/m requesting 3 month refill on generic ambien; pt will save $2.00 on 3 month rx rather than 1 month rx. [facility] .Please advise.

## 2014-04-10 NOTE — Telephone Encounter (Signed)
Faxed refill request. Last Filled:    30 tablet 1 RF on 01/31/2014  Please advise.

## 2014-04-11 MED ORDER — ZOLPIDEM TARTRATE 10 MG PO TABS: 10.0000 mg | ORAL_TABLET | Freq: Every day | ORAL | Status: DC

## 2014-04-11 NOTE — Telephone Encounter (Signed)
Please call in.  Thanks.   

## 2014-04-11 NOTE — Telephone Encounter (Signed)
Medication phoned to pharmacy.  

## 2014-04-24 ENCOUNTER — Encounter: Payer: Self-pay | Admitting: Family Medicine

## 2014-04-25 ENCOUNTER — Other Ambulatory Visit: Payer: Self-pay | Admitting: Family Medicine

## 2014-04-25 ENCOUNTER — Ambulatory Visit: Payer: Self-pay | Admitting: Family Medicine

## 2014-04-25 MED ORDER — HYDROCODONE-ACETAMINOPHEN 10-325 MG PO TABS: 1.0000 | ORAL_TABLET | Freq: Four times a day (QID) | ORAL | Status: DC | PRN

## 2014-05-15 ENCOUNTER — Emergency Department (HOSPITAL_BASED_OUTPATIENT_CLINIC_OR_DEPARTMENT_OTHER)
Admission: EM | Admit: 2014-05-15 | Discharge: 2014-05-16 | Disposition: A | Discharge status: Home / Self Care | Payer: Self-pay | Attending: Emergency Medicine | Admitting: Emergency Medicine

## 2014-05-15 ENCOUNTER — Emergency Department (HOSPITAL_BASED_OUTPATIENT_CLINIC_OR_DEPARTMENT_OTHER): Payer: Self-pay

## 2014-05-15 ENCOUNTER — Encounter (HOSPITAL_BASED_OUTPATIENT_CLINIC_OR_DEPARTMENT_OTHER): Payer: Self-pay | Admitting: *Deleted

## 2014-05-15 DIAGNOSIS — Z79899 Other long term (current) drug therapy: Secondary | ICD-10-CM | POA: Insufficient documentation

## 2014-05-15 DIAGNOSIS — Z9049 Acquired absence of other specified parts of digestive tract: Secondary | ICD-10-CM | POA: Insufficient documentation

## 2014-05-15 DIAGNOSIS — G4700 Insomnia, unspecified: Secondary | ICD-10-CM | POA: Insufficient documentation

## 2014-05-15 DIAGNOSIS — K219 Gastro-esophageal reflux disease without esophagitis: Secondary | ICD-10-CM | POA: Insufficient documentation

## 2014-05-15 DIAGNOSIS — N23 Unspecified renal colic: Secondary | ICD-10-CM

## 2014-05-15 DIAGNOSIS — N201 Calculus of ureter: Secondary | ICD-10-CM | POA: Insufficient documentation

## 2014-05-15 DIAGNOSIS — Z87442 Personal history of urinary calculi: Secondary | ICD-10-CM | POA: Insufficient documentation

## 2014-05-15 DIAGNOSIS — F329 Major depressive disorder, single episode, unspecified: Secondary | ICD-10-CM | POA: Insufficient documentation

## 2014-05-15 LAB — URINE MICROSCOPIC-ADD ON

## 2014-05-15 LAB — URINALYSIS, ROUTINE W REFLEX MICROSCOPIC
Bilirubin Urine: NEGATIVE
Glucose, UA: NEGATIVE mg/dL
Ketones, ur: 15 mg/dL — AB
Leukocytes, UA: NEGATIVE
Nitrite: NEGATIVE
Protein, ur: NEGATIVE mg/dL
Specific Gravity, Urine: 1.024 (ref 1.005–1.030)
Urobilinogen, UA: 1 mg/dL (ref 0.0–1.0)
pH: 6 (ref 5.0–8.0)

## 2014-05-15 MED ORDER — SODIUM CHLORIDE 0.9 % IV BOLUS (SEPSIS): 1000.0000 mL | Freq: Once | INTRAVENOUS | Status: DC

## 2014-05-15 NOTE — ED Notes (Signed)
Left flank pain on and off x 2 days.

## 2014-05-16 MED ORDER — KETOROLAC TROMETHAMINE 60 MG/2ML IM SOLN
60.0000 mg | Freq: Once | INTRAMUSCULAR | Status: AC
  Administered 2014-05-16: 60 mg via INTRAMUSCULAR
  Filled 2014-05-16: qty 2

## 2014-05-16 NOTE — Discharge Instructions (Signed)

## 2014-05-16 NOTE — ED Provider Notes (Addendum)
CSN: [id number]     Arrival date & time 05/15/14  2213 History   First MD Initiated Contact with Patient 05/15/14 2342     Chief Complaint  Patient presents with  . Flank Pain     (Consider location/radiation/quality/duration/timing/severity/associated sxs/prior Treatment) HPI  This is a 34 year old male with a history of kidney stones and left orchiectomy for chronic pain. He also has a history of chronic pain and is currently being prescribed 120, 10mg /325mg  hydrocodone/APAP tablets monthly, prescribed by his PCP Dr. Elsie Stain. He has been honest and up front about his chronic pain treatment and does not wish to violate his contract.  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 Pain has been moderate to severe and is characterized as like previous kidney stones. His urine has been dark. Pain is not worse with movement.   Past Medical History  Diagnosis Date  . GERD (gastroesophageal reflux disease)   . History of kidney stones   . Seizures Childhood  . Depression     and panic/anxiety  . Insomnia   . Kidney stones    Past Surgical History  Procedure Laterality Date  . Lithotripsy      four times over the years; most recent 10/08/12  . Appendectomy  2011  . Laparoscopic cholecystectomy  1996  . Tonsillectomy  1992  . Wrist ganglion excision  2002    Right  . Hydrocele excision Left 06/2012  . Orchiectomy Left 11/2012   Family History  Problem Relation Age of Onset  . Stroke Mother   . Liver disease Mother   . Irritable bowel syndrome Mother   . Kidney disease Mother   . Heart disease Father   . Colon polyps Father   . Colon cancer Neg Hx    History  Substance Use Topics  . Smoking status: Never Smoker   . Smokeless tobacco: Never Used  . Alcohol Use: Yes     Comment: rarely    Review of Systems  All other systems reviewed and are negative.   Allergies  Tape  and Wellbutrin  Home Medications   Prior to Admission medications   Medication Sig Start Date End Date Taking? Authorizing Provider  docusate sodium (COLACE) 100 MG capsule Take 1 capsule (100 mg total) by mouth 2 (two) times daily. 09/26/13   Tonia Ghent, MD  escitalopram (LEXAPRO) 20 MG tablet Take 1.5 tablets (30 mg total) by mouth at bedtime. 02/03/14   Tonia Ghent, MD  HYDROcodone-acetaminophen (NORCO) 10-325 MG per tablet Take 1 tablet by mouth every 6 (six) hours as needed. 04/25/14   Tonia Ghent, MD  HYDROmorphone (DILAUDID) 2 MG tablet Take 1 tablet (2 mg total) by mouth every 4 (four) hours as needed for severe pain. Sedation caution.  Call if questions. 12/30/13   Tonia Ghent, MD  ibuprofen (ADVIL,MOTRIN) 200 MG tablet Take 800 mg by mouth every 6 (six) hours as needed for mild pain or moderate pain (chronic groin & kidney pain).     Historical Provider, MD  indapamide (LOZOL) 2.5 MG tablet Take 2.5 mg by mouth every morning.     Historical Provider, MD  ondansetron (ZOFRAN-ODT) 4 MG disintegrating tablet Take 1 tablet (4 mg total) by mouth every 4 (four) hours as needed for nausea or vomiting (nausea). 12/30/13   Tonia Ghent, MD  pantoprazole (PROTONIX) 40 MG tablet  TAKE ONE TABLET BY MOUTH TWICE DAILY every day (not as needed) 02/05/14   Jerene Bears, MD  polyethylene glycol powder (GLYCOLAX/MIRALAX) powder Take 17 g by mouth 2 (two) times daily as needed. 12/30/13   Tonia Ghent, MD  potassium chloride (K-DUR) 10 MEQ tablet Take 10 mEq by mouth 2 (two) times daily.    Historical Provider, MD  zolpidem (AMBIEN) 10 MG tablet Take 1 tablet (10 mg total) by mouth at bedtime. 04/11/14   Tonia Ghent, MD   BP 140/104 mmHg  Pulse 87  Temp(Src) 98.2 F (36.8 C) (Oral)  Resp 18  Ht 6' (1.829 m)  Wt 290 lb (131.543 kg)  BMI 39.32 kg/m2  SpO2 98%   Physical Exam  General: Well-developed, well-nourished male in no acute distress; appearance consistent with age of  record HENT: normocephalic; atraumatic Eyes: pupils equal, round and reactive to light; extraocular muscles intact Neck: supple Heart: regular rate and rhythm Lungs: clear to auscultation bilaterally Abdomen: soft; nondistended; left lower quadrant tenderness; no masses or hepatosplenomegaly; bowel sounds present Back: Nerve stimulator in right lower back with well-healed midline surgical incision GU: No CVA tenderness Extremities: No deformity; full range of motion; pulses normal Neurologic: Awake, alert and oriented; motor function intact in all extremities and symmetric; no facial droop Skin: Warm and dry Psychiatric: Normal mood and affect    ED Course  Procedures (including critical care time)   MDM   Nursing notes and vitals signs, including pulse oximetry, reviewed.  Summary of this visit's results, reviewed by myself:  Labs:  Results for orders placed or performed during the hospital encounter of 05/15/14 (from the past 24 hour(s))  Urinalysis, Routine w reflex microscopic     Status: Abnormal   Collection Time: 05/15/14 10:20 PM  Result Value Ref Range   Color, Urine YELLOW YELLOW   APPearance CLEAR CLEAR   Specific Gravity, Urine 1.024 1.005 - 1.030   pH 6.0 5.0 - 8.0   Glucose, UA NEGATIVE NEGATIVE mg/dL   Hgb urine dipstick LARGE (A) NEGATIVE   Bilirubin Urine NEGATIVE NEGATIVE   Ketones, ur 15 (A) NEGATIVE mg/dL   Protein, ur NEGATIVE NEGATIVE mg/dL   Urobilinogen, UA 1.0 0.0 - 1.0 mg/dL   Nitrite NEGATIVE NEGATIVE   Leukocytes, UA NEGATIVE NEGATIVE  Urine microscopic-add on     Status: None   Collection Time: 05/15/14 10:20 PM  Result Value Ref Range   Squamous Epithelial / LPF RARE RARE   RBC / HPF 21-50 <3 RBC/hpf   Bacteria, UA RARE RARE    Imaging Studies: US Renal  05/16/2014   CLINICAL DATA:  Left flank pain, history of kidney stones.  EXAM: RENAL/URINARY TRACT ULTRASOUND COMPLETE  COMPARISON:  03/20/2014  FINDINGS: Right Kidney:  Length: 12.4  cm. Non shadowing 4 mm echogenic focus may reflect a stone. No hydronephrosis. No focal lesion identified. Lobular contour.  Left Kidney:  Length: 13 cm. Shadowing 6 mm echogenic focus within the lower pole, in keeping with a stone. Other echogenic foci measured by the sonographer indeterminate. No hydronephrosis. No focal lesion identified. Lobular contour.  Bladder:  In the bladder or at the left UVJ, there is a 3 mm echogenic focus.  IMPRESSION: Nonobstructing renal calculi.  3 mm echogenic focus at the left UVJ or just past the UVJ into the bladder may reflect a stone given the provided history. No hydronephrosis.   Electronically Signed   By: Carlos Levering M.D.   On: 05/16/2014 00:52  1:38 AM The patient agrees to portal 60 milligrams IM. He does not wish any additional narcotics, has adequate Zofran at home, and will consult Dr. Damita Dunnings regarding pain management if necessary. He has a urologist in the area with whom he can follow-up if he does not pass the stone.    Shanon Rosser, MD 05/16/14 0140  Shanon Rosser, MD 05/16/14 [id number]

## 2014-05-16 NOTE — ED Notes (Signed)
Pt presents w/ primarily Lt flank pain, pain radiates to scrotal area, pt states "urinating dark urine lately"

## 2014-05-28 ENCOUNTER — Encounter: Payer: Self-pay | Admitting: Family Medicine

## 2014-05-29 ENCOUNTER — Other Ambulatory Visit: Payer: Self-pay | Admitting: *Deleted

## 2014-05-29 ENCOUNTER — Other Ambulatory Visit: Payer: Self-pay | Admitting: Family Medicine

## 2014-05-29 MED ORDER — HYDROCODONE-ACETAMINOPHEN 10-325 MG PO TABS: 1.0000 | ORAL_TABLET | Freq: Four times a day (QID) | ORAL | Status: DC | PRN

## 2014-05-29 MED ORDER — HYDROXYZINE HCL 10 MG PO TABS: 10.0000 mg | ORAL_TABLET | Freq: Three times a day (TID) | ORAL | Status: DC | PRN

## 2014-05-29 MED ORDER — ONDANSETRON 4 MG PO TBDP: 4.0000 mg | ORAL_TABLET | ORAL | Status: DC | PRN

## 2014-05-29 NOTE — Telephone Encounter (Signed)
Patient requests refill in addition to the other Rx's previously printed.  Last Filled:    60 tablet 0 RF  on 12/30/2013  Please advise.

## 2014-05-29 NOTE — Telephone Encounter (Signed)
Sent. Thanks.   

## 2014-05-29 NOTE — Telephone Encounter (Signed)
Added to previous RX envelope to be picked up by patient.

## 2014-05-29 NOTE — Telephone Encounter (Signed)
Rx's printed and signed.  Patient advised. Rx left at front desk for pick up.

## 2014-06-30 ENCOUNTER — Encounter: Payer: Self-pay | Admitting: Family Medicine

## 2014-07-01 ENCOUNTER — Other Ambulatory Visit: Payer: Self-pay

## 2014-07-01 MED ORDER — HYDROCODONE-ACETAMINOPHEN 10-325 MG PO TABS: 1.0000 | ORAL_TABLET | Freq: Four times a day (QID) | ORAL | Status: DC | PRN

## 2014-07-01 NOTE — Telephone Encounter (Signed)
Printed and in Kim's box 

## 2014-07-01 NOTE — Telephone Encounter (Signed)
Patient notified and Rx placed up front for pick up. 

## 2014-07-01 NOTE — Telephone Encounter (Signed)
Pt left v/m that he had sent email on 06/30/14;Pt requesting rx hydrocodone apap. Call when ready for pick up. Pt last seen f/u appt on 12/30/2013; rx last printed # 120 on 05/29/14. Pt is out of med and is going out of town on 07/02/14.Please advise.

## 2014-07-01 NOTE — Telephone Encounter (Signed)
Will route to PCP. [place], what meds does he need refilled?

## 2014-07-02 ENCOUNTER — Other Ambulatory Visit: Payer: Self-pay | Admitting: Family Medicine

## 2014-07-02 MED ORDER — ZOLPIDEM TARTRATE 10 MG PO TABS: 10.0000 mg | ORAL_TABLET | Freq: Every day | ORAL | Status: DC

## 2014-07-02 NOTE — Progress Notes (Signed)
Please call in [place], if not already done.  Thanks.

## 2014-07-03 NOTE — Progress Notes (Signed)
Medication phoned to pharmacy.  

## 2014-07-15 ENCOUNTER — Emergency Department (HOSPITAL_BASED_OUTPATIENT_CLINIC_OR_DEPARTMENT_OTHER): Payer: Self-pay

## 2014-07-15 ENCOUNTER — Encounter (HOSPITAL_BASED_OUTPATIENT_CLINIC_OR_DEPARTMENT_OTHER): Payer: Self-pay | Admitting: Emergency Medicine

## 2014-07-15 ENCOUNTER — Encounter: Payer: Self-pay | Admitting: Family Medicine

## 2014-07-15 ENCOUNTER — Emergency Department (HOSPITAL_BASED_OUTPATIENT_CLINIC_OR_DEPARTMENT_OTHER)
Admission: EM | Admit: 2014-07-15 | Discharge: 2014-07-15 | Disposition: A | Discharge status: Home / Self Care | Payer: Self-pay | Attending: Emergency Medicine | Admitting: Emergency Medicine

## 2014-07-15 DIAGNOSIS — N508 Other specified disorders of male genital organs: Secondary | ICD-10-CM | POA: Insufficient documentation

## 2014-07-15 DIAGNOSIS — G4700 Insomnia, unspecified: Secondary | ICD-10-CM | POA: Insufficient documentation

## 2014-07-15 DIAGNOSIS — F329 Major depressive disorder, single episode, unspecified: Secondary | ICD-10-CM | POA: Insufficient documentation

## 2014-07-15 DIAGNOSIS — R52 Pain, unspecified: Secondary | ICD-10-CM

## 2014-07-15 DIAGNOSIS — Z9049 Acquired absence of other specified parts of digestive tract: Secondary | ICD-10-CM | POA: Insufficient documentation

## 2014-07-15 DIAGNOSIS — K219 Gastro-esophageal reflux disease without esophagitis: Secondary | ICD-10-CM | POA: Insufficient documentation

## 2014-07-15 DIAGNOSIS — N200 Calculus of kidney: Secondary | ICD-10-CM | POA: Insufficient documentation

## 2014-07-15 DIAGNOSIS — Z79899 Other long term (current) drug therapy: Secondary | ICD-10-CM | POA: Insufficient documentation

## 2014-07-15 DIAGNOSIS — Z9889 Other specified postprocedural states: Secondary | ICD-10-CM | POA: Insufficient documentation

## 2014-07-15 LAB — URINALYSIS, ROUTINE W REFLEX MICROSCOPIC
Bilirubin Urine: NEGATIVE
Glucose, UA: NEGATIVE mg/dL
Ketones, ur: NEGATIVE mg/dL
Leukocytes, UA: NEGATIVE
Nitrite: NEGATIVE
Protein, ur: NEGATIVE mg/dL
Specific Gravity, Urine: 1.003 — ABNORMAL LOW (ref 1.005–1.030)
Urobilinogen, UA: 0.2 mg/dL (ref 0.0–1.0)
pH: 6 (ref 5.0–8.0)

## 2014-07-15 LAB — URINE MICROSCOPIC-ADD ON

## 2014-07-15 MED ORDER — KETOROLAC TROMETHAMINE 30 MG/ML IJ SOLN
30.0000 mg | Freq: Once | INTRAMUSCULAR | Status: AC
Start: 2014-07-15 — End: 2014-07-15
  Administered 2014-07-15: 30 mg via INTRAVENOUS
  Filled 2014-07-15: qty 1

## 2014-07-15 MED ORDER — MELOXICAM 15 MG PO TABS: 15.0000 mg | ORAL_TABLET | Freq: Every day | ORAL | Status: DC

## 2014-07-15 MED ORDER — TAMSULOSIN HCL 0.4 MG PO CAPS
0.4000 mg | ORAL_CAPSULE | Freq: Every day | ORAL | Status: DC
  Administered 2014-07-15: 0.4 mg via ORAL
  Filled 2014-07-15: qty 1

## 2014-07-15 MED ORDER — HYDROMORPHONE HCL 1 MG/ML IJ SOLN
1.0000 mg | Freq: Once | INTRAMUSCULAR | Status: AC
  Administered 2014-07-15: 1 mg via INTRAVENOUS
  Filled 2014-07-15: qty 1

## 2014-07-15 MED ORDER — ONDANSETRON HCL 4 MG/2ML IJ SOLN
4.0000 mg | Freq: Once | INTRAMUSCULAR | Status: AC
  Administered 2014-07-15: 4 mg via INTRAVENOUS
  Filled 2014-07-15: qty 2

## 2014-07-15 MED ORDER — TAMSULOSIN HCL 0.4 MG PO CAPS: 0.4000 mg | ORAL_CAPSULE | Freq: Every day | ORAL | Status: DC

## 2014-07-15 NOTE — ED Notes (Signed)
Patient reports that he is having left sided flank pain radiating down into his left groin. Hx of kidney stones

## 2014-07-15 NOTE — ED Provider Notes (Signed)
CSN: [id number]     Arrival date & time 07/15/14  0555 History   First MD Initiated Contact with Patient 07/15/14 [phone]     Chief Complaint  Patient presents with  . Flank Pain     (Consider location/radiation/quality/duration/timing/severity/associated sxs/prior Treatment) Patient is a 34 y.o. male presenting with flank pain. The history is provided by the patient.  Flank Pain This is a recurrent problem. The current episode started 6 to 12 hours ago. The problem occurs constantly. The problem has not changed since onset.Pertinent negatives include no chest pain, no abdominal pain, no headaches and no shortness of breath. Nothing aggravates the symptoms. Nothing relieves the symptoms. Treatments tried: norco. The treatment provided no relief.  States he "just lives around the corner".    Past Medical History  Diagnosis Date  . GERD (gastroesophageal reflux disease)   . History of kidney stones   . Seizures Childhood  . Depression     and panic/anxiety  . Insomnia   . Kidney stones    Past Surgical History  Procedure Laterality Date  . Lithotripsy      four times over the years; most recent 10/08/12  . Appendectomy  2011  . Laparoscopic cholecystectomy  1996  . Tonsillectomy  1992  . Wrist ganglion excision  2002    Right  . Hydrocele excision Left 06/2012  . Orchiectomy Left 11/2012   Family History  Problem Relation Age of Onset  . Stroke Mother   . Liver disease Mother   . Irritable bowel syndrome Mother   . Kidney disease Mother   . Heart disease Father   . Colon polyps Father   . Colon cancer Neg Hx    History  Substance Use Topics  . Smoking status: Never Smoker   . Smokeless tobacco: Never Used  . Alcohol Use: Yes     Comment: rarely    Review of Systems  Constitutional: Negative for fever.  Respiratory: Negative for shortness of breath.   Cardiovascular: Negative for chest pain.  Gastrointestinal: Negative for abdominal pain.  Genitourinary: Positive  for flank pain and testicular pain. Negative for dysuria.  Neurological: Negative for headaches.  All other systems reviewed and are negative.     Allergies  Tape and Wellbutrin  Home Medications   Prior to Admission medications   Medication Sig Start Date End Date Taking? Authorizing Provider  docusate sodium (COLACE) 100 MG capsule Take 1 capsule (100 mg total) by mouth 2 (two) times daily. 09/26/13   Tonia Ghent, MD  escitalopram (LEXAPRO) 20 MG tablet Take 1.5 tablets (30 mg total) by mouth at bedtime. 02/03/14   Tonia Ghent, MD  HYDROcodone-acetaminophen (NORCO) 10-325 MG per tablet Take 1 tablet by mouth every 6 (six) hours as needed. 07/01/14   Ria Bush, MD  HYDROmorphone (DILAUDID) 2 MG tablet Take 1 tablet (2 mg total) by mouth every 4 (four) hours as needed for severe pain. Sedation caution.  Call if questions. 12/30/13   Tonia Ghent, MD  hydrOXYzine (ATARAX/VISTARIL) 10 MG tablet Take 1 tablet (10 mg total) by mouth 3 (three) times daily as needed for anxiety (sedation caution). 05/29/14   Tonia Ghent, MD  ibuprofen (ADVIL,MOTRIN) 200 MG tablet Take 800 mg by mouth every 6 (six) hours as needed for mild pain or moderate pain (chronic groin & kidney pain).     Historical Provider, MD  indapamide (LOZOL) 2.5 MG tablet Take 2.5 mg by mouth every morning.  Historical Provider, MD  ondansetron (ZOFRAN-ODT) 4 MG disintegrating tablet Take 1 tablet (4 mg total) by mouth every 4 (four) hours as needed for nausea or vomiting (nausea). 05/29/14   Tonia Ghent, MD  pantoprazole (PROTONIX) 40 MG tablet TAKE ONE TABLET BY MOUTH TWICE DAILY every day (not as needed) 02/05/14   Jerene Bears, MD  polyethylene glycol powder (GLYCOLAX/MIRALAX) powder Take 17 g by mouth 2 (two) times daily as needed. 12/30/13   Tonia Ghent, MD  potassium chloride (K-DUR) 10 MEQ tablet Take 10 mEq by mouth 2 (two) times daily.    Historical Provider, MD  zolpidem (AMBIEN) 10 MG tablet Take 1  tablet (10 mg total) by mouth at bedtime. 07/02/14   Tonia Ghent, MD   BP 153/121 mmHg  Pulse 83  Resp 20  Ht 6' (1.829 m)  Wt 285 lb (129.275 kg)  BMI 38.64 kg/m2  SpO2 100% Physical Exam  Constitutional: He is oriented to person, place, and time. He appears well-developed and well-nourished. No distress.  HENT:  Head: Normocephalic and atraumatic.  Mouth/Throat: Oropharynx is clear and moist.  Eyes: Conjunctivae are normal. Pupils are equal, round, and reactive to light.  Neck: Normal range of motion. Neck supple.  Cardiovascular: Normal rate, regular rhythm and intact distal pulses.   Pulmonary/Chest: Effort normal and breath sounds normal. He has no wheezes. He has no rales.  Abdominal: Soft. Bowel sounds are normal. There is no tenderness. There is no rebound and no guarding.  Musculoskeletal: Normal range of motion.  Neurological: He is alert and oriented to person, place, and time.  Skin: Skin is warm and dry.  Psychiatric: He has a normal mood and affect.    ED Course  Procedures (including critical care time) Labs Review Labs Reviewed  URINALYSIS, ROUTINE W REFLEX MICROSCOPIC (NOT AT [facility]) - Abnormal; Notable for the following:    Specific Gravity, Urine 1.003 (*)    Hgb urine dipstick LARGE (*)    All other components within normal limits  URINE MICROSCOPIC-ADD ON    Imaging Review No results found.   EKG Interpretation None      MDM   Final diagnoses:  Pain    Will treat with a dose of narcotics when family arrives.  Will not d/c with narcotics as patient has a narcotic contract and just received 120 norco on 07/02/14.  Will prescribe flomax.  Follow up with urology in 7 days    Mariaclara Spear, MD 07/15/14 [phone]

## 2014-07-15 NOTE — ED Notes (Signed)
Per MD: ride must be here before pain medicine is given to assure pt safety.

## 2014-07-16 ENCOUNTER — Other Ambulatory Visit: Payer: Self-pay | Admitting: Family Medicine

## 2014-07-16 MED ORDER — HYDROMORPHONE HCL 2 MG PO TABS: 2.0000 mg | ORAL_TABLET | ORAL | Status: DC | PRN

## 2014-07-17 ENCOUNTER — Emergency Department (HOSPITAL_BASED_OUTPATIENT_CLINIC_OR_DEPARTMENT_OTHER): Payer: Self-pay

## 2014-07-17 ENCOUNTER — Encounter (HOSPITAL_BASED_OUTPATIENT_CLINIC_OR_DEPARTMENT_OTHER): Payer: Self-pay

## 2014-07-17 ENCOUNTER — Emergency Department (HOSPITAL_BASED_OUTPATIENT_CLINIC_OR_DEPARTMENT_OTHER)
Admission: EM | Admit: 2014-07-17 | Discharge: 2014-07-18 | Disposition: A | Discharge status: Home / Self Care | Payer: Self-pay | Attending: Emergency Medicine | Admitting: Emergency Medicine

## 2014-07-17 DIAGNOSIS — Z791 Long term (current) use of non-steroidal anti-inflammatories (NSAID): Secondary | ICD-10-CM | POA: Insufficient documentation

## 2014-07-17 DIAGNOSIS — N508 Other specified disorders of male genital organs: Secondary | ICD-10-CM | POA: Insufficient documentation

## 2014-07-17 DIAGNOSIS — K219 Gastro-esophageal reflux disease without esophagitis: Secondary | ICD-10-CM | POA: Insufficient documentation

## 2014-07-17 DIAGNOSIS — G4700 Insomnia, unspecified: Secondary | ICD-10-CM | POA: Insufficient documentation

## 2014-07-17 DIAGNOSIS — N200 Calculus of kidney: Secondary | ICD-10-CM | POA: Insufficient documentation

## 2014-07-17 DIAGNOSIS — N50819 Testicular pain, unspecified: Secondary | ICD-10-CM

## 2014-07-17 DIAGNOSIS — Z79899 Other long term (current) drug therapy: Secondary | ICD-10-CM | POA: Insufficient documentation

## 2014-07-17 DIAGNOSIS — G8929 Other chronic pain: Secondary | ICD-10-CM | POA: Insufficient documentation

## 2014-07-17 DIAGNOSIS — R61 Generalized hyperhidrosis: Secondary | ICD-10-CM | POA: Insufficient documentation

## 2014-07-17 LAB — BASIC METABOLIC PANEL
Anion gap: 11 (ref 5–15)
BUN: 15 mg/dL (ref 6–20)
CO2: 25 mmol/L (ref 22–32)
Calcium: 9.6 mg/dL (ref 8.9–10.3)
Chloride: 101 mmol/L (ref 101–111)
Creatinine, Ser: 1.23 mg/dL (ref 0.61–1.24)
GFR calc Af Amer: >60 mL/min (ref >60)
GFR calc non Af Amer: >60 mL/min (ref >60)
Glucose, Bld: 97 mg/dL (ref 65–99)
Potassium: 4.2 mmol/L (ref 3.5–5.1)
Sodium: 137 mmol/L (ref 135–145)

## 2014-07-17 LAB — URINALYSIS, ROUTINE W REFLEX MICROSCOPIC
Bilirubin Urine: NEGATIVE
Glucose, UA: NEGATIVE mg/dL
Ketones, ur: 15 mg/dL — AB
Leukocytes, UA: NEGATIVE
Nitrite: NEGATIVE
Protein, ur: 30 mg/dL — AB
Specific Gravity, Urine: 1.026 (ref 1.005–1.030)
Urobilinogen, UA: 0.2 mg/dL (ref 0.0–1.0)
pH: 6 (ref 5.0–8.0)

## 2014-07-17 LAB — URINE MICROSCOPIC-ADD ON

## 2014-07-17 MED ORDER — HYDROMORPHONE HCL 1 MG/ML IJ SOLN
1.0000 mg | Freq: Once | INTRAMUSCULAR | Status: AC
  Administered 2014-07-17: 1 mg via INTRAVENOUS

## 2014-07-17 MED ORDER — ONDANSETRON HCL 4 MG/2ML IJ SOLN
4.0000 mg | Freq: Once | INTRAMUSCULAR | Status: AC
  Administered 2014-07-17: 4 mg via INTRAVENOUS
  Filled 2014-07-17: qty 2

## 2014-07-17 MED ORDER — HYDROMORPHONE HCL 1 MG/ML IJ SOLN
2.0000 mg | Freq: Once | INTRAMUSCULAR | Status: AC
  Administered 2014-07-17: 2 mg via INTRAVENOUS
  Filled 2014-07-17: qty 2

## 2014-07-17 MED ORDER — KETOROLAC TROMETHAMINE 30 MG/ML IJ SOLN
30.0000 mg | Freq: Once | INTRAMUSCULAR | Status: AC
  Administered 2014-07-17: 30 mg via INTRAVENOUS
  Filled 2014-07-17: qty 1

## 2014-07-17 MED ORDER — HYDROMORPHONE HCL 1 MG/ML IJ SOLN
2.0000 mg | Freq: Once | INTRAMUSCULAR | Status: DC
  Filled 2014-07-17: qty 2

## 2014-07-17 MED ORDER — SODIUM CHLORIDE 0.9 % IV BOLUS (SEPSIS)
1000.0000 mL | Freq: Once | INTRAVENOUS | Status: AC
Start: 2014-07-17 — End: 2014-07-18
  Administered 2014-07-17: 1000 mL via INTRAVENOUS

## 2014-07-17 NOTE — ED Notes (Signed)
MD at bedside. 

## 2014-07-17 NOTE — ED Notes (Signed)
Patient dry heaving during assessment.

## 2014-07-17 NOTE — ED Notes (Signed)
Dx with kidney stone 3 days ago per pt

## 2014-07-17 NOTE — ED Provider Notes (Signed)
CSN: [id number]     Arrival date & time 07/17/14  2147 History  This chart was scribed for Ezequiel Essex, MD by Stephania Fragmin, ED Scribe. This patient was seen in room MH03/MH03 and the patient's care was started at 10:10 PM.    Chief Complaint  Patient presents with  . Nephrolithiasis   The history is provided by the patient. No language interpreter was used.    HPI Comments: Casey Caldwell is a 34 y.o. male with a PMHx of chronic kidney stones, who presents to the Emergency Department complaining of constant, severe, worsening left flank pain and right testicular pain--"like someone is boxing my testicle" that began 3 days ago. He also complains of associated nausea and diaphoresis. He was seen 2 days ago for the same pain and had relief when he was discharged; however, his pain gradually returned after he went home. Patient states his pain is worse than usual, and although Vicodin normally alleviates his pain, he has taken 30 mg of Vicodin over 4 hours with no relief. He has a prescription for Dilaudid prescribed by Dr. Damita Dunnings for kidney stones, but he states it is currently in [place] and he has not yet had it filled. He also takes daily hydrocodone for back and groin pain, as well as a spinal cord stimulator implant. Patient reports a history of 8 surgeries for kidney stones, including his latest lithotripsy in 2014, although he doesn't currently have stents in place now. 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 He denies vomiting. He is able to urinate. Patient's urologist Dr. Tresa Endo. Patient has NKDA.    Past Medical History  Diagnosis Date  . GERD (gastroesophageal reflux disease)   . History of kidney stones   . Seizures Childhood  . Depression     and panic/anxiety  . Insomnia   . Kidney stones    Past Surgical History  Procedure Laterality Date  . Lithotripsy      four times over the years;  most recent 10/08/12  . Appendectomy  2011  . Laparoscopic cholecystectomy  1996  . Tonsillectomy  1992  . Wrist ganglion excision  2002    Right  . Hydrocele excision Left 06/2012  . Orchiectomy Left 11/2012   Family History  Problem Relation Age of Onset  . Stroke Mother   . Liver disease Mother   . Irritable bowel syndrome Mother   . Kidney disease Mother   . Heart disease Father   . Colon polyps Father   . Colon cancer Neg Hx    History  Substance Use Topics  . Smoking status: Never Smoker   . Smokeless tobacco: Never Used  . Alcohol Use: Yes     Comment: rarely    Review of Systems  Constitutional: Positive for diaphoresis.  Gastrointestinal: Positive for nausea. Negative for vomiting.  Genitourinary: Positive for flank pain and testicular pain.  Musculoskeletal: Positive for back pain (chronic, baseline).  A complete 10 system review of systems was obtained and all systems are negative except as noted in the HPI and PMH.     Allergies  Tape and Wellbutrin  Home Medications   Prior to Admission medications   Medication Sig Start Date End Date Taking? Authorizing Provider  docusate sodium (COLACE) 100 MG capsule Take 1 capsule (100 mg total) by mouth 2 (two) times daily. 09/26/13   Tonia Ghent, MD  escitalopram (LEXAPRO) 20 MG tablet Take 1.5 tablets (  30 mg total) by mouth at bedtime. 02/03/14   Tonia Ghent, MD  HYDROcodone-acetaminophen (NORCO) 10-325 MG per tablet Take 1 tablet by mouth every 6 (six) hours as needed. 07/01/14   Ria Bush, MD  HYDROmorphone (DILAUDID) 2 MG tablet Take 1 tablet (2 mg total) by mouth every 4 (four) hours as needed for severe pain (for kidney stones). Sedation caution.  Call if questions. 07/16/14   Tonia Ghent, MD  hydrOXYzine (ATARAX/VISTARIL) 10 MG tablet Take 1 tablet (10 mg total) by mouth 3 (three) times daily as needed for anxiety (sedation caution). 05/29/14   Tonia Ghent, MD  ibuprofen (ADVIL,MOTRIN) 200 MG  tablet Take 800 mg by mouth every 6 (six) hours as needed for mild pain or moderate pain (chronic groin & kidney pain).     Historical Provider, MD  indapamide (LOZOL) 2.5 MG tablet Take 2.5 mg by mouth every morning.     Historical Provider, MD  meloxicam (MOBIC) 15 MG tablet Take 1 tablet (15 mg total) by mouth daily. 07/15/14   April Palumbo, MD  ondansetron (ZOFRAN-ODT) 4 MG disintegrating tablet Take 1 tablet (4 mg total) by mouth every 4 (four) hours as needed for nausea or vomiting (nausea). 05/29/14   Tonia Ghent, MD  pantoprazole (PROTONIX) 40 MG tablet TAKE ONE TABLET BY MOUTH TWICE DAILY every day (not as needed) 02/05/14   Jerene Bears, MD  polyethylene glycol powder (GLYCOLAX/MIRALAX) powder Take 17 g by mouth 2 (two) times daily as needed. 12/30/13   Tonia Ghent, MD  potassium chloride (K-DUR) 10 MEQ tablet Take 10 mEq by mouth 2 (two) times daily.    Historical Provider, MD  tamsulosin (FLOMAX) 0.4 MG CAPS capsule Take 1 capsule (0.4 mg total) by mouth daily. 07/15/14   April Palumbo, MD  zolpidem (AMBIEN) 10 MG tablet Take 1 tablet (10 mg total) by mouth at bedtime. 07/02/14   Tonia Ghent, MD   BP 132/85 mmHg  Pulse 86  Temp(Src) 97.9 F (36.6 C) (Oral)  Resp 18  Ht 6' (1.829 m)  Wt 285 lb (129.275 kg)  BMI 38.64 kg/m2  SpO2 100% Physical Exam  Constitutional: He is oriented to person, place, and time. He appears well-developed and well-nourished. No distress.  Uncomfortable, standing bent over a stretcher.   HENT:  Head: Normocephalic and atraumatic.  Mouth/Throat: Oropharynx is clear and moist. No oropharyngeal exudate.  Eyes: Conjunctivae and EOM are normal. Pupils are equal, round, and reactive to light.  Neck: Normal range of motion. Neck supple.  No meningismus.  Cardiovascular: Normal rate, regular rhythm, normal heart sounds and intact distal pulses.   No murmur heard. Pulses:      Dorsalis pedis pulses are 2+ on the right side, and 2+ on the left side.        Posterior tibial pulses are 2+ on the right side, and 2+ on the left side.  Intact DP and PT pulses.   Pulmonary/Chest: Effort normal and breath sounds normal. No respiratory distress.  Abdominal: Soft. There is tenderness. There is CVA tenderness. There is no rebound and no guarding.  Left sided abdominal tenderness. Left CVA tenderness.  Genitourinary: Right testis shows no tenderness.  Right testicle non-tender. Left testicle surgically removed.   Musculoskeletal: Normal range of motion. He exhibits no edema or tenderness.  Neurological: He is alert and oriented to person, place, and time. No cranial nerve deficit. He exhibits normal muscle tone. Coordination normal.  No ataxia on finger  to nose bilaterally. No pronator drift. 5/5 strength throughout. CN 2-12 intact. Negative Romberg. Equal grip strength. Sensation intact. Gait is normal.   Skin: Skin is warm.  Psychiatric: He has a normal mood and affect. His behavior is normal.  Nursing note and vitals reviewed.   ED Course  Procedures (including critical care time)  DIAGNOSTIC STUDIES: Oxygen Saturation is 96% on RA, normal by my interpretation.    COORDINATION OF CARE: 10:20 PM - Discussed treatment plan with pt at bedside which includes XR and CT scan, and pt agreed to plan.   Labs Review Labs Reviewed  BASIC METABOLIC PANEL  URINALYSIS, ROUTINE W REFLEX MICROSCOPIC (NOT AT [facility])   Imaging Review Dg Abd 1 View  07/17/2014   CLINICAL DATA:  Acute onset of left lower quadrant abdominal pain and flank pain. Initial encounter.  EXAM: ABDOMEN - 1 VIEW  COMPARISON:  CT of the abdomen and pelvis performed 07/15/2014, and renal ultrasound performed 05/16/2014  FINDINGS: The visualized bowel gas pattern is unremarkable. Scattered air and stool filled loops of colon are seen; no abnormal dilatation of small bowel loops is seen to suggest small bowel obstruction. No free intra-abdominal air is identified, though evaluation for free air  is limited on a single supine view.  An 8 mm stone is noted overlying the proximal left ureter, as seen on CT. The increased measurement reflects the longer craniocaudal dimension of the stone.  The visualized osseous structures are within normal limits; the sacroiliac joints are unremarkable in appearance. A metallic device is noted overlying the right lower quadrant, with leads extending along the lumbar spine. Clips are noted within the right upper quadrant, reflecting prior cholecystectomy.  IMPRESSION: 1. 8 mm stone is noted overlying the proximal left ureter, as seen on CT. Its position is essentially unchanged from the recent prior CT. 2. Unremarkable bowel gas pattern; no free intra-abdominal air seen. Small to moderate amount of stool noted in the colon.   Electronically Signed   By: Garald Balding M.D.   On: 07/17/2014 23:15     EKG Interpretation None      MDM   Final diagnoses:  None    acute on chronic flank pain with history of kidney stones and chronic pain.  Left-sided kidney stone diagnosed by CT 2 days ago. Dry heaving at home. Pain uncontrolled by his chronic hydrocodone. His doctor called in Dilaudid today which she has not filled.   Uncomfortable on exam. Left CVA tenderness.  CT scan from 2 days ago reviewed.   X-ray today does not show any progression of stone. Creatinine 1.2. UA show blood, no infection.  Pain controlled in the ED.  Needs followup with his urologist Tresa Endo ASAP.  Has pain meds at home.  No vomiting in the ED.  Testicular US pending at time of sign out to Dr. Darl Householder.   I personally performed the services described in this documentation, which was scribed in my presence. The recorded information has been reviewed and is accurate.   Ezequiel Essex, MD 07/18/14 [phone]

## 2014-07-17 NOTE — ED Notes (Signed)
Patient with knees on floor leaned over bed in pain.  Reports pain 10/10

## 2014-07-18 ENCOUNTER — Emergency Department (HOSPITAL_BASED_OUTPATIENT_CLINIC_OR_DEPARTMENT_OTHER): Payer: Self-pay

## 2014-07-18 MED ORDER — HYDROMORPHONE HCL 1 MG/ML IJ SOLN
1.0000 mg | Freq: Once | INTRAMUSCULAR | Status: AC
  Administered 2014-07-18: 1 mg via INTRAVENOUS
  Filled 2014-07-18: qty 1

## 2014-07-18 NOTE — ED Notes (Signed)
Discharge instructions given and reviewed with patient.  Patient verbalized understanding to follow up with urologist and PMD.  Patient ambulatory; discharged home in good condition.

## 2014-07-18 NOTE — ED Notes (Signed)
Patient transported to Ultrasound 

## 2014-07-18 NOTE — Discharge Instructions (Signed)
Kidney Stones Call your urologist for an appointment as soon as possible. Return to the ED if you develop new or worsening symptoms. Kidney stones (urolithiasis) are deposits that form inside your kidneys. The intense pain is caused by the stone moving through the urinary tract. When the stone moves, the ureter goes into spasm around the stone. The stone is usually passed in the urine.  CAUSES   A disorder that makes certain neck glands produce too much parathyroid hormone (primary hyperparathyroidism).  A buildup of uric acid crystals, similar to gout in your joints.  Narrowing (stricture) of the ureter.  A kidney obstruction present at birth (congenital obstruction).  Previous surgery on the kidney or ureters.  Numerous kidney infections. SYMPTOMS   Feeling sick to your stomach (nauseous).  Throwing up (vomiting).  Blood in the urine (hematuria).  Pain that usually spreads (radiates) to the groin.  Frequency or urgency of urination. DIAGNOSIS   Taking a history and physical exam.  Blood or urine tests.  CT scan.  Occasionally, an examination of the inside of the urinary bladder (cystoscopy) is performed. TREATMENT   Observation.  Increasing your fluid intake.  Extracorporeal shock wave lithotripsy--This is a noninvasive procedure that uses shock waves to break up kidney stones.  Surgery may be needed if you have severe pain or persistent obstruction. There are various surgical procedures. Most of the procedures are performed with the use of small instruments. Only small incisions are needed to accommodate these instruments, so recovery time is minimized. The size, location, and chemical composition are all important variables that will determine the proper choice of action for you. Talk to your health care provider to better understand your situation so that you will minimize the risk of injury to yourself and your kidney.  HOME CARE INSTRUCTIONS   Drink enough water  and fluids to keep your urine clear or pale yellow. This will help you to pass the stone or stone fragments.  Strain all urine through the provided strainer. Keep all particulate matter and stones for your health care provider to see. The stone causing the pain may be as small as a grain of salt. It is very important to use the strainer each and every time you pass your urine. The collection of your stone will allow your health care provider to analyze it and verify that a stone has actually passed. The stone analysis will often identify what you can do to reduce the incidence of recurrences.  Only take over-the-counter or prescription medicines for pain, discomfort, or fever as directed by your health care provider.  Make a follow-up appointment with your health care provider as directed.  Get follow-up X-rays if required. The absence of pain does not always mean that the stone has passed. It may have only stopped moving. If the urine remains completely obstructed, it can cause loss of kidney function or even complete destruction of the kidney. It is your responsibility to make sure X-rays and follow-ups are completed. Ultrasounds of the kidney can show blockages and the status of the kidney. Ultrasounds are not associated with any radiation and can be performed easily in a matter of minutes. SEEK MEDICAL CARE IF:  You experience pain that is progressive and unresponsive to any pain medicine you have been prescribed. SEEK IMMEDIATE MEDICAL CARE IF:   Pain cannot be controlled with the prescribed medicine.  You have a fever or shaking chills.  The severity or intensity of pain increases over 18 hours and is  not relieved by pain medicine.  You develop a new onset of abdominal pain.  You feel faint or pass out.  You are unable to urinate. MAKE SURE YOU:   Understand these instructions.  Will watch your condition.  Will get help right away if you are not doing well or get worse. Document  Released: 01/10/2005 Document Revised: 09/12/2012 Document Reviewed: 06/13/2012 [facility] Patient Information 2015 [place], [place]. This information is not intended to replace advice given to you by your health care provider. Make sure you discuss any questions you have with your health care provider.

## 2014-07-18 NOTE — ED Provider Notes (Signed)
  Physical Exam  BP 132/85 mmHg  Pulse 86  Temp(Src) 97.9 F (36.6 C) (Oral)  Resp 18  Ht 6' (1.829 m)  Wt 285 lb (129.275 kg)  BMI 38.64 kg/m2  SpO2 100%  Physical Exam  ED Course  Procedures  MDM Care assumed from Dr. Wyvonnia Dusky. Has diagnosed 6 mm stone on CT. Has worsening pain. Sign out pending testicular US. US showed no torsion. Pain likely from renal colic. Pain improved with dilaudid. Has urology f/u. Has dilaudid prescription that he hasn't picked up. Will dc home.   Wandra Arthurs, MD 07/18/14 [phone]

## 2014-07-21 ENCOUNTER — Ambulatory Visit
Admission: RE | Admit: 2014-07-21 | Discharge: 2014-07-21 | Disposition: A | Discharge status: Home / Self Care | Payer: No Typology Code available for payment source | Source: Ambulatory Visit | Attending: Urology | Admitting: Urology

## 2014-07-21 ENCOUNTER — Other Ambulatory Visit: Payer: Self-pay | Admitting: Urology

## 2014-07-21 DIAGNOSIS — N201 Calculus of ureter: Secondary | ICD-10-CM

## 2014-07-25 ENCOUNTER — Encounter: Payer: Self-pay | Admitting: Family Medicine

## 2014-07-27 ENCOUNTER — Emergency Department (HOSPITAL_BASED_OUTPATIENT_CLINIC_OR_DEPARTMENT_OTHER)
Admission: EM | Admit: 2014-07-27 | Discharge: 2014-07-27 | Disposition: A | Discharge status: Home / Self Care | Payer: No Typology Code available for payment source | Attending: Emergency Medicine | Admitting: Emergency Medicine

## 2014-07-27 ENCOUNTER — Encounter (HOSPITAL_BASED_OUTPATIENT_CLINIC_OR_DEPARTMENT_OTHER): Payer: Self-pay | Admitting: *Deleted

## 2014-07-27 DIAGNOSIS — G4700 Insomnia, unspecified: Secondary | ICD-10-CM | POA: Insufficient documentation

## 2014-07-27 DIAGNOSIS — Z87442 Personal history of urinary calculi: Secondary | ICD-10-CM | POA: Insufficient documentation

## 2014-07-27 DIAGNOSIS — Z791 Long term (current) use of non-steroidal anti-inflammatories (NSAID): Secondary | ICD-10-CM | POA: Insufficient documentation

## 2014-07-27 DIAGNOSIS — Z79899 Other long term (current) drug therapy: Secondary | ICD-10-CM | POA: Insufficient documentation

## 2014-07-27 DIAGNOSIS — N508 Other specified disorders of male genital organs: Secondary | ICD-10-CM | POA: Insufficient documentation

## 2014-07-27 DIAGNOSIS — N23 Unspecified renal colic: Secondary | ICD-10-CM | POA: Insufficient documentation

## 2014-07-27 DIAGNOSIS — Z8669 Personal history of other diseases of the nervous system and sense organs: Secondary | ICD-10-CM | POA: Insufficient documentation

## 2014-07-27 DIAGNOSIS — K219 Gastro-esophageal reflux disease without esophagitis: Secondary | ICD-10-CM | POA: Insufficient documentation

## 2014-07-27 MED ORDER — HYDROMORPHONE HCL 1 MG/ML IJ SOLN
2.0000 mg | Freq: Once | INTRAMUSCULAR | Status: AC
  Administered 2014-07-27: 2 mg via INTRAVENOUS
  Filled 2014-07-27: qty 2

## 2014-07-27 MED ORDER — ONDANSETRON 8 MG PO TBDP
ORAL_TABLET | ORAL | Status: AC
  Filled 2014-07-27: qty 1

## 2014-07-27 MED ORDER — ONDANSETRON HCL 8 MG PO TABS: 8.0000 mg | ORAL_TABLET | ORAL | Status: DC | PRN

## 2014-07-27 MED ORDER — HYDROMORPHONE HCL 1 MG/ML IJ SOLN
1.0000 mg | Freq: Once | INTRAMUSCULAR | Status: AC
  Administered 2014-07-27: 1 mg via INTRAVENOUS
  Filled 2014-07-27: qty 1

## 2014-07-27 MED ORDER — KETOROLAC TROMETHAMINE 30 MG/ML IJ SOLN
30.0000 mg | Freq: Once | INTRAMUSCULAR | Status: AC
  Administered 2014-07-27: 30 mg via INTRAVENOUS
  Filled 2014-07-27: qty 1

## 2014-07-27 MED ORDER — METOCLOPRAMIDE HCL 5 MG/ML IJ SOLN
10.0000 mg | Freq: Once | INTRAMUSCULAR | Status: AC
  Administered 2014-07-27: 10 mg via INTRAVENOUS
  Filled 2014-07-27: qty 2

## 2014-07-27 MED ORDER — ONDANSETRON 8 MG PO TBDP
8.0000 mg | ORAL_TABLET | Freq: Once | ORAL | Status: AC
  Administered 2014-07-27: 8 mg via ORAL

## 2014-07-27 NOTE — ED Notes (Signed)
Pt placed on cont POX, pt repositioned for comfort

## 2014-07-27 NOTE — ED Notes (Signed)
Pt states "feels much better" after 2nd pain med administration, requesting ice chips.

## 2014-07-27 NOTE — ED Notes (Signed)
Pt states he is scheduled for "kidney stone surgery" this thursday

## 2014-07-27 NOTE — ED Provider Notes (Signed)
CSN: [id number]     Arrival date & time 07/27/14  0712 History   First MD Initiated Contact with Patient 07/27/14 [phone]     Chief Complaint  Patient presents with  . Back Pain     (Consider location/radiation/quality/duration/timing/severity/associated sxs/prior Treatment) HPI [place] of left flank pain radiating to left scrotum onset 1.5 weeks ago becoming worse last night. Symptoms are coming by nausea. No fever. No other associated symptoms He had CT scan of abdomen and pelvis 07/14/2016 showing .5.5 millimeter left ureteral stone. Has seen his urologist within the past week. Is scheduled for intervention next week. No other associated symptoms. Treated himself with hydromorphone and hydrocodone at home, without relief. Past Medical History  Diagnosis Date  . GERD (gastroesophageal reflux disease)   . History of kidney stones   . Seizures Childhood  . Depression     and panic/anxiety  . Insomnia   . Kidney stones    Past Surgical History  Procedure Laterality Date  . Lithotripsy      four times over the years; most recent 10/08/12  . Appendectomy  2011  . Laparoscopic cholecystectomy  1996  . Tonsillectomy  1992  . Wrist ganglion excision  2002    Right  . Hydrocele excision Left 06/2012  . Orchiectomy Left 11/2012   Family History  Problem Relation Age of Onset  . Stroke Mother   . Liver disease Mother   . Irritable bowel syndrome Mother   . Kidney disease Mother   . Heart disease Father   . Colon polyps Father   . Colon cancer Neg Hx    History  Substance Use Topics  . Smoking status: Never Smoker   . Smokeless tobacco: Never Used  . Alcohol Use: Yes     Comment: rarely    Review of Systems  Constitutional: Negative.   HENT: Negative.   Respiratory: Negative.   Cardiovascular: Negative.   Gastrointestinal: Negative.   Genitourinary: Positive for flank pain.       Scrotal pain  Musculoskeletal: Negative.   Skin: Negative.   Neurological: Negative.    Psychiatric/Behavioral: Negative.   All other systems reviewed and are negative.     Allergies  Tape and Wellbutrin  Home Medications   Prior to Admission medications   Medication Sig Start Date End Date Taking? Authorizing Provider  docusate sodium (COLACE) 100 MG capsule Take 1 capsule (100 mg total) by mouth 2 (two) times daily. 09/26/13   Tonia Ghent, MD  escitalopram (LEXAPRO) 20 MG tablet Take 1.5 tablets (30 mg total) by mouth at bedtime. 02/03/14   Tonia Ghent, MD  HYDROcodone-acetaminophen (NORCO) 10-325 MG per tablet Take 1 tablet by mouth every 6 (six) hours as needed. 07/01/14   Ria Bush, MD  HYDROmorphone (DILAUDID) 2 MG tablet Take 1 tablet (2 mg total) by mouth every 4 (four) hours as needed for severe pain (for kidney stones). Sedation caution.  Call if questions. 07/16/14   Tonia Ghent, MD  hydrOXYzine (ATARAX/VISTARIL) 10 MG tablet Take 1 tablet (10 mg total) by mouth 3 (three) times daily as needed for anxiety (sedation caution). 05/29/14   Tonia Ghent, MD  ibuprofen (ADVIL,MOTRIN) 200 MG tablet Take 800 mg by mouth every 6 (six) hours as needed for mild pain or moderate pain (chronic groin & kidney pain).     Historical Provider, MD  indapamide (LOZOL) 2.5 MG tablet Take 2.5 mg by mouth every morning.     Historical Provider, MD  meloxicam (MOBIC) 15 MG tablet Take 1 tablet (15 mg total) by mouth daily. 07/15/14   April Palumbo, MD  ondansetron (ZOFRAN-ODT) 4 MG disintegrating tablet Take 1 tablet (4 mg total) by mouth every 4 (four) hours as needed for nausea or vomiting (nausea). 05/29/14   Tonia Ghent, MD  pantoprazole (PROTONIX) 40 MG tablet TAKE ONE TABLET BY MOUTH TWICE DAILY every day (not as needed) 02/05/14   Jerene Bears, MD  polyethylene glycol powder (GLYCOLAX/MIRALAX) powder Take 17 g by mouth 2 (two) times daily as needed. 12/30/13   Tonia Ghent, MD  potassium chloride (K-DUR) 10 MEQ tablet Take 10 mEq by mouth 2 (two) times daily.     Historical Provider, MD  tamsulosin (FLOMAX) 0.4 MG CAPS capsule Take 1 capsule (0.4 mg total) by mouth daily. 07/15/14   April Palumbo, MD  zolpidem (AMBIEN) 10 MG tablet Take 1 tablet (10 mg total) by mouth at bedtime. 07/02/14   Tonia Ghent, MD   BP 153/106 mmHg  Pulse 88  Temp(Src) 97.7 F (36.5 C) (Oral)  Resp 20  Ht 6' (1.829 m)  Wt 285 lb (129.275 kg)  BMI 38.64 kg/m2  SpO2 95% Physical Exam  Constitutional: He appears well-developed and well-nourished. He appears distressed.  Appears uncomfortable Glasgow Coma Score 15  HENT:  Head: Normocephalic and atraumatic.  Eyes: Conjunctivae are normal. Pupils are equal, round, and reactive to light.  Neck: Neck supple. No tracheal deviation present. No thyromegaly present.  Cardiovascular: Normal rate and regular rhythm.   No murmur heard. Pulmonary/Chest: Effort normal and breath sounds normal.  Abdominal: Soft. Bowel sounds are normal. He exhibits no distension. There is no tenderness.  Obese  Genitourinary: Penis normal.  Scrotum is not swollen. No flank tenderness  Musculoskeletal: Normal range of motion. He exhibits no edema or tenderness.  Neurological: He is alert. Coordination normal.  Skin: Skin is warm and dry. No rash noted.  Psychiatric: He has a normal mood and affect.  Nursing note and vitals reviewed.   ED Course  Procedures (including critical care time) Labs Review Labs Reviewed - No data to display  Imaging Review No results found.   EKG Interpretation None     9:15 AM feels improved and ready to go home after treatment with intravenous opioids, Reglan and Toradol. He is requesting oral Zofran for nausea. MDM  Reports he's run out of his Zofran, which she has at home for nausea. He is not requesting prescription for opioids pain medicine. He has adequate pain medication at home. He is is followed by pain management physician. He has follow-up scheduled with urologist in 3 days. Diagnosis ureteral  colic  Final diagnoses:  None    Orlie Dakin, MD 07/27/14 [phone]

## 2014-07-27 NOTE — ED Notes (Signed)
Pt cont to c/o pain, has repositioned self to attempt to become comfortable, EDP notified

## 2014-07-27 NOTE — Discharge Instructions (Signed)
Kidney Stones Keep your scheduled appointment with your urologist in 3 days Kidney stones (urolithiasis) are solid masses that form inside your kidneys. The intense pain is caused by the stone moving through the kidney, ureter, bladder, and urethra (urinary tract). When the stone moves, the ureter starts to spasm around the stone. The stone is usually passed in your pee (urine).  HOME CARE  Drink enough fluids to keep your pee clear or pale yellow. This helps to get the stone out.  Strain all pee through the provided strainer. Do not pee without peeing through the strainer, not even once. If you pee the stone out, catch it in the strainer. The stone may be as small as a grain of salt. Take this to your doctor. This will help your doctor figure out what you can do to try to prevent more kidney stones.  Only take medicine as told by your doctor.  Follow up with your doctor as told.  Get follow-up X-rays as told by your doctor. GET HELP IF: You have pain that gets worse even if you have been taking pain medicine. GET HELP RIGHT AWAY IF:   Your pain does not get better with medicine.  You have a fever or shaking chills.  Your pain increases and gets worse over 18 hours.  You have new belly (abdominal) pain.  You feel faint or pass out.  You are unable to pee. MAKE SURE YOU:   Understand these instructions.  Will watch your condition.  Will get help right away if you are not doing well or get worse. Document Released: 06/29/2007 Document Revised: 09/12/2012 Document Reviewed: 06/13/2012 [facility] Patient Information 2015 [place], [place]. This information is not intended to replace advice given to you by your health care provider. Make sure you discuss any questions you have with your health care provider.

## 2014-07-27 NOTE — ED Notes (Signed)
MD at bedside to speak with pt and wife re: assessment and plan of care

## 2014-07-27 NOTE — ED Notes (Signed)
Pt presents with back pain, abd pain, radiating into pelvis

## 2014-07-27 NOTE — ED Notes (Signed)
Pt resting quietly, watching TV, wife at bedside

## 2014-07-27 NOTE — ED Notes (Signed)
Family at bedside. 

## 2014-07-29 ENCOUNTER — Other Ambulatory Visit: Payer: Self-pay | Admitting: Internal Medicine

## 2014-08-01 ENCOUNTER — Ambulatory Visit (INDEPENDENT_AMBULATORY_CARE_PROVIDER_SITE_OTHER): Payer: Self-pay | Admitting: Family Medicine

## 2014-08-01 ENCOUNTER — Encounter: Payer: Self-pay | Admitting: Family Medicine

## 2014-08-01 VITALS — BP 126/92 | HR 76 | Temp 97.6°F | Wt 305.5 lb

## 2014-08-01 DIAGNOSIS — R1011 Right upper quadrant pain: Secondary | ICD-10-CM

## 2014-08-01 DIAGNOSIS — F418 Other specified anxiety disorders: Secondary | ICD-10-CM

## 2014-08-01 DIAGNOSIS — F329 Major depressive disorder, single episode, unspecified: Secondary | ICD-10-CM

## 2014-08-01 DIAGNOSIS — R1032 Left lower quadrant pain: Secondary | ICD-10-CM

## 2014-08-01 DIAGNOSIS — N200 Calculus of kidney: Secondary | ICD-10-CM

## 2014-08-01 DIAGNOSIS — F32A Depression, unspecified: Secondary | ICD-10-CM

## 2014-08-01 DIAGNOSIS — F419 Anxiety disorder, unspecified: Secondary | ICD-10-CM

## 2014-08-01 MED ORDER — ONDANSETRON HCL 4 MG PO TABS: 4.0000 mg | ORAL_TABLET | ORAL | Status: DC | PRN

## 2014-08-01 MED ORDER — HYDROCODONE-ACETAMINOPHEN 10-325 MG PO TABS: 1.0000 | ORAL_TABLET | Freq: Four times a day (QID) | ORAL | Status: DC | PRN

## 2014-08-01 MED ORDER — PANTOPRAZOLE SODIUM 40 MG PO TBEC: DELAYED_RELEASE_TABLET | ORAL | Status: DC

## 2014-08-01 MED ORDER — ESCITALOPRAM OXALATE 20 MG PO TABS: 30.0000 mg | ORAL_TABLET | Freq: Every day | ORAL | Status: DC

## 2014-08-01 NOTE — Progress Notes (Signed)
Pre visit review using our clinic review tool, if applicable. No additional management support is needed unless otherwise documented below in the visit note.  Renal stones. Lithotripsy done 07/29/14.  Passing fragments.  Still on pain meds at baseline with prn dilaudid. No ADE on meds.  Less stones on indapamide overall.    Groin pain.  He has spinal cord stim in place, with next adjustment to be done when needed.  His groin pain is improved.    His mood is improved with better pain control overall and his current meds.   He is back to working some- work will likely pick up in fall of this year.    Meds, vitals, and allergies reviewed.   ROS: See HPI.  Otherwise, noncontributory.  nad ncat Mmm Neck supple, no LA rrr ctab abd soft Back with scar well healed.

## 2014-08-01 NOTE — Patient Instructions (Addendum)
Take care.  Glad to see you. Don't change for meds for now.  I'll await the urology notes.

## 2014-08-04 NOTE — Assessment & Plan Note (Signed)
Improved with better pain control, continue as is.

## 2014-08-04 NOTE — Assessment & Plan Note (Signed)
Continue indapamide and I'll await uro input.

## 2014-08-04 NOTE — Assessment & Plan Note (Signed)
He'll continue miralax.  I wrote to continue his PPI for now.

## 2014-08-04 NOTE — Assessment & Plan Note (Signed)
Improved, though not resolved, with spinal cord stimulator.  Continue current meds.  No ADE on meds.

## 2014-09-01 ENCOUNTER — Encounter: Payer: Self-pay | Admitting: Family Medicine

## 2014-09-03 ENCOUNTER — Other Ambulatory Visit: Payer: Self-pay | Admitting: Family Medicine

## 2014-09-03 MED ORDER — HYDROMORPHONE HCL 2 MG PO TABS: 2.0000 mg | ORAL_TABLET | ORAL | Status: DC | PRN

## 2014-09-03 MED ORDER — ONDANSETRON 4 MG PO TBDP: 4.0000 mg | ORAL_TABLET | Freq: Three times a day (TID) | ORAL | Status: DC | PRN

## 2014-09-03 MED ORDER — HYDROCODONE-ACETAMINOPHEN 10-325 MG PO TABS: 1.0000 | ORAL_TABLET | Freq: Four times a day (QID) | ORAL | Status: DC | PRN

## 2014-09-03 NOTE — Progress Notes (Signed)
Please put up front for patient after I sign. Thanks.

## 2014-09-03 NOTE — Progress Notes (Signed)
Message left notifying patient that Rx's were ready and up front for pick up.

## 2014-09-04 ENCOUNTER — Telehealth: Payer: Self-pay | Admitting: Family Medicine

## 2014-09-04 NOTE — Telephone Encounter (Signed)
Assuming we can see Taunt today, then please get Hubbert the 4:15 tomorrow.  Thanks.

## 2014-09-04 NOTE — Telephone Encounter (Signed)
Please call pt.  Pain and mood are worse, ie related.  Please see about getting him scheduled tomorrow.   His mood has been really low.  Please see if he'll contract for safety.   Thanks.

## 2014-09-04 NOTE — Telephone Encounter (Signed)
Patient advised. Appointment scheduled. Patient says he doesn't think Leonides Sake will be able to come in for the visit.  She has started school and is pretty tied up.  You were already full for tomorrow but I used the 2:45 slot which is normally blocked.  The 4:15 for tomorrow (Mr. Lucy Chris)  is coming in today.  Let me know if I should call back and ask if he can come at the 4:15 spot if Mr. Taunt doesn't need it.

## 2014-09-05 ENCOUNTER — Ambulatory Visit (INDEPENDENT_AMBULATORY_CARE_PROVIDER_SITE_OTHER): Payer: Self-pay | Admitting: Family Medicine

## 2014-09-05 ENCOUNTER — Encounter: Payer: Self-pay | Admitting: Family Medicine

## 2014-09-05 ENCOUNTER — Ambulatory Visit: Payer: No Typology Code available for payment source | Admitting: Family Medicine

## 2014-09-05 VITALS — BP 122/88 | HR 85 | Temp 97.5°F | Wt 304.5 lb

## 2014-09-05 DIAGNOSIS — F419 Anxiety disorder, unspecified: Secondary | ICD-10-CM

## 2014-09-05 DIAGNOSIS — N200 Calculus of kidney: Secondary | ICD-10-CM

## 2014-09-05 DIAGNOSIS — F32A Depression, unspecified: Secondary | ICD-10-CM

## 2014-09-05 DIAGNOSIS — G4700 Insomnia, unspecified: Secondary | ICD-10-CM

## 2014-09-05 DIAGNOSIS — R1032 Left lower quadrant pain: Secondary | ICD-10-CM

## 2014-09-05 DIAGNOSIS — F418 Other specified anxiety disorders: Secondary | ICD-10-CM

## 2014-09-05 DIAGNOSIS — F329 Major depressive disorder, single episode, unspecified: Secondary | ICD-10-CM

## 2014-09-05 NOTE — Progress Notes (Signed)
Pre visit review using our clinic review tool, if applicable. No additional management support is needed unless otherwise documented below in the visit note.  F/u for chronic pain.  Has repeated renal stones/pain flares, with h/o sig back and groin pain with spinal cord stimulator.  He he has seen Dr. Catheryn Bacon about the stimulator.  He current meds have decreased but not eliminated ER treatment for acute pain flares.  His mood is worse with the pain flares, as expected.  On 40mg  lexapro a day.  No SI/HI, does contract for safety.  Wife supportive.  D/w pt about options re: meds and treatment from this point onward.  No new sx, no weakness.  No ADE on meds.  He does have chronic alternating constipation/diarrhea, and he is on a bowel regimen for that.    Meds, vitals, and allergies reviewed.   ROS: See HPI.  Otherwise, noncontributory.  nad Uncomfortable from back pain Speech and judgement wnl Not sedated.   Mmm rrr ctab Ext w/o edema Back exam deferred as it wouldn't change mgmt

## 2014-09-05 NOTE — Patient Instructions (Signed)
Update me Monday when you hear from the pain clinic. Take care.  Glad to see you.

## 2014-09-07 ENCOUNTER — Telehealth: Payer: Self-pay | Admitting: Family Medicine

## 2014-09-07 NOTE — Assessment & Plan Note (Signed)
Continue prn ambien.  We are trying to treat this within the context of his pain and pain-related mood changes.

## 2014-09-07 NOTE — Assessment & Plan Note (Signed)
Clearly worse during pain flares.  Still okay for outpatient f/u, contracts for safety.  Will ask for Dr. Edwina Barth input on changing to cymbalta, this may be a reasonable option for pain control and mood treatment.  D/w pt in general, but we didn't alter his med regimen yet.  Pt will also call about f/u with the pain clinic.

## 2014-09-07 NOTE — Telephone Encounter (Signed)
Please call Dr. Edwina Barth clinic at [phone].  Patient still with sig pain and I want to know  1. When they can see him at their clinic and  2. If it would worth trying to transition him off lexapro and over to cymbalta.  If Dr. Catheryn Bacon thinks that they can make adjustments to his spinal stimulator setting to help the pain, then we may not need to consider changing to cymbalta.  Thanks.

## 2014-09-07 NOTE — Assessment & Plan Note (Signed)
See above re: back pain.  >25 minutes spent in face to face time with patient, >50% spent in counselling or coordination of care.

## 2014-09-07 NOTE — Assessment & Plan Note (Signed)
Continue meds as is for now.  He's going to f/u with uro at [facility].  I'll await input.

## 2014-09-08 NOTE — Telephone Encounter (Signed)
Receptionist took message to have one of the fellows to call you as Dr. Catheryn Bacon is out of the office until Friday.

## 2014-09-09 NOTE — Telephone Encounter (Signed)
Left detailed message on voicemail.  

## 2014-09-09 NOTE — Telephone Encounter (Signed)
Call pt.  D/w fellow yesterday. I wouldn't change cymbalta yet, but we may need to do that in the long run.  Goal to get patient seen and rechecked at pain clinic sooner rather than later.  They'll call him about a follow up appointment. Thanks.

## 2014-09-23 ENCOUNTER — Other Ambulatory Visit: Payer: Self-pay | Admitting: Family Medicine

## 2014-09-23 MED ORDER — ZOLPIDEM TARTRATE 10 MG PO TABS: 10.0000 mg | ORAL_TABLET | Freq: Every day | ORAL | Status: DC

## 2014-09-23 NOTE — Telephone Encounter (Signed)
Medication phoned to pharmacy.  

## 2014-10-06 ENCOUNTER — Emergency Department (HOSPITAL_BASED_OUTPATIENT_CLINIC_OR_DEPARTMENT_OTHER): Payer: No Typology Code available for payment source

## 2014-10-06 ENCOUNTER — Emergency Department (HOSPITAL_BASED_OUTPATIENT_CLINIC_OR_DEPARTMENT_OTHER)
Admission: EM | Admit: 2014-10-06 | Discharge: 2014-10-06 | Disposition: A | Discharge status: Home / Self Care | Payer: No Typology Code available for payment source | Attending: Emergency Medicine | Admitting: Emergency Medicine

## 2014-10-06 ENCOUNTER — Encounter (HOSPITAL_BASED_OUTPATIENT_CLINIC_OR_DEPARTMENT_OTHER): Payer: Self-pay | Admitting: *Deleted

## 2014-10-06 DIAGNOSIS — Z79899 Other long term (current) drug therapy: Secondary | ICD-10-CM | POA: Insufficient documentation

## 2014-10-06 DIAGNOSIS — Z87442 Personal history of urinary calculi: Secondary | ICD-10-CM | POA: Insufficient documentation

## 2014-10-06 DIAGNOSIS — R1032 Left lower quadrant pain: Secondary | ICD-10-CM | POA: Insufficient documentation

## 2014-10-06 DIAGNOSIS — M549 Dorsalgia, unspecified: Secondary | ICD-10-CM | POA: Insufficient documentation

## 2014-10-06 DIAGNOSIS — F419 Anxiety disorder, unspecified: Secondary | ICD-10-CM | POA: Insufficient documentation

## 2014-10-06 DIAGNOSIS — F329 Major depressive disorder, single episode, unspecified: Secondary | ICD-10-CM | POA: Insufficient documentation

## 2014-10-06 DIAGNOSIS — K219 Gastro-esophageal reflux disease without esophagitis: Secondary | ICD-10-CM | POA: Insufficient documentation

## 2014-10-06 DIAGNOSIS — R61 Generalized hyperhidrosis: Secondary | ICD-10-CM | POA: Insufficient documentation

## 2014-10-06 DIAGNOSIS — G4700 Insomnia, unspecified: Secondary | ICD-10-CM | POA: Insufficient documentation

## 2014-10-06 DIAGNOSIS — R509 Fever, unspecified: Secondary | ICD-10-CM | POA: Insufficient documentation

## 2014-10-06 LAB — BASIC METABOLIC PANEL
Anion gap: 9 (ref 5–15)
BUN: 14 mg/dL (ref 6–20)
CO2: 31 mmol/L (ref 22–32)
Calcium: 9.7 mg/dL (ref 8.9–10.3)
Chloride: 98 mmol/L — ABNORMAL LOW (ref 101–111)
Creatinine, Ser: 0.89 mg/dL (ref 0.61–1.24)
GFR calc Af Amer: >60 mL/min (ref >60)
GFR calc non Af Amer: >60 mL/min (ref >60)
Glucose, Bld: 113 mg/dL — ABNORMAL HIGH (ref 65–99)
Potassium: 3.6 mmol/L (ref 3.5–5.1)
Sodium: 138 mmol/L (ref 135–145)

## 2014-10-06 LAB — URINALYSIS, ROUTINE W REFLEX MICROSCOPIC
Bilirubin Urine: NEGATIVE
Glucose, UA: NEGATIVE mg/dL
Hgb urine dipstick: NEGATIVE
Ketones, ur: NEGATIVE mg/dL
Leukocytes, UA: NEGATIVE
Nitrite: NEGATIVE
Protein, ur: NEGATIVE mg/dL
Specific Gravity, Urine: 1.022 (ref 1.005–1.030)
Urobilinogen, UA: 1 mg/dL (ref 0.0–1.0)
pH: 6.5 (ref 5.0–8.0)

## 2014-10-06 LAB — CBC WITH DIFFERENTIAL/PLATELET
Basophils Absolute: 0 10*3/uL (ref 0.0–0.1)
Basophils Relative: 0 % (ref 0–1)
Eosinophils Absolute: 0.3 10*3/uL (ref 0.0–0.7)
Eosinophils Relative: 3 % (ref 0–5)
HCT: 48.9 % (ref 39.0–52.0)
Hemoglobin: 17.3 g/dL — ABNORMAL HIGH (ref 13.0–17.0)
Lymphocytes Relative: 27 % (ref 12–46)
Lymphs Abs: 2.6 10*3/uL (ref 0.7–4.0)
MCH: 30.1 pg (ref 26.0–34.0)
MCHC: 35.4 g/dL (ref 30.0–36.0)
MCV: 85 fL (ref 78.0–100.0)
Monocytes Absolute: 0.8 10*3/uL (ref 0.1–1.0)
Monocytes Relative: 8 % (ref 3–12)
Neutro Abs: 6 10*3/uL (ref 1.7–7.7)
Neutrophils Relative %: 62 % (ref 43–77)
Platelets: 326 10*3/uL (ref 150–400)
RBC: 5.75 MIL/uL (ref 4.22–5.81)
RDW: 13 % (ref 11.5–15.5)
WBC: 9.7 10*3/uL (ref 4.0–10.5)

## 2014-10-06 MED ORDER — KETOROLAC TROMETHAMINE 30 MG/ML IJ SOLN
30.0000 mg | Freq: Once | INTRAMUSCULAR | Status: AC
  Administered 2014-10-06: 30 mg via INTRAVENOUS
  Filled 2014-10-06: qty 1

## 2014-10-06 MED ORDER — SODIUM CHLORIDE 0.9 % IV BOLUS (SEPSIS)
1000.0000 mL | Freq: Once | INTRAVENOUS | Status: AC
Start: 2014-10-06 — End: 2014-10-06
  Administered 2014-10-06: 1000 mL via INTRAVENOUS

## 2014-10-06 MED ORDER — HYDROMORPHONE HCL 1 MG/ML IJ SOLN
2.0000 mg | Freq: Once | INTRAMUSCULAR | Status: AC
  Administered 2014-10-06: 2 mg via INTRAVENOUS
  Filled 2014-10-06: qty 2

## 2014-10-06 MED ORDER — ONDANSETRON HCL 4 MG/2ML IJ SOLN
4.0000 mg | Freq: Once | INTRAMUSCULAR | Status: AC
  Administered 2014-10-06: 4 mg via INTRAVENOUS
  Filled 2014-10-06: qty 2

## 2014-10-06 NOTE — ED Notes (Signed)
Lower back pain x 5 days with radiation into his groin.  Worse on the right side. Hx of kidney stones. States this pain feels like his kidney stone pain.

## 2014-10-06 NOTE — Discharge Instructions (Signed)
Flank Pain °Flank pain refers to pain that is located on the side of the body between the upper abdomen and the back. The pain may occur over a short period of time (acute) or may be long-term or reoccurring (chronic). It may be mild or severe. Flank pain can be caused by many things. °CAUSES  °Some of the more common causes of flank pain include: °· Muscle strains.   °· Muscle spasms.   °· A disease of your spine (vertebral disk disease).   °· A lung infection (pneumonia).   °· Fluid around your lungs (pulmonary edema).   °· A kidney infection.   °· Kidney stones.   °· A very painful skin rash caused by the chickenpox virus (shingles).   °· Gallbladder disease.   °HOME CARE INSTRUCTIONS  °Home care will depend on the cause of your pain. In general, °· Rest as directed by your caregiver. °· Drink enough fluids to keep your urine clear or pale yellow. °· Only take over-the-counter or prescription medicines as directed by your caregiver. Some medicines may help relieve the pain. °· Tell your caregiver about any changes in your pain. °· Follow up with your caregiver as directed. °SEEK IMMEDIATE MEDICAL CARE IF:  °· Your pain is not controlled with medicine.   °· You have new or worsening symptoms. °· Your pain increases.   °· You have abdominal pain.   °· You have shortness of breath.   °· You have persistent nausea or vomiting.   °· You have swelling in your abdomen.   °· You feel faint or pass out.   °· You have blood in your urine. °· You have a fever or persistent symptoms for more than 2-3 days. °· You have a fever and your symptoms suddenly get worse. °MAKE SURE YOU:  °· Understand these instructions. °· Will watch your condition. °· Will get help right away if you are not doing well or get worse. °Document Released: 03/03/2005 Document Revised: 10/05/2011 Document Reviewed: 08/25/2011 °ExitCare® Patient Information ©2015 [employer]. This information is not intended to replace advice given to you by your  health care provider. Make sure you discuss any questions you have with your health care provider. ° °

## 2014-10-06 NOTE — ED Provider Notes (Addendum)
CSN: [id number]     Arrival date & time 10/06/14  1617 History  This chart was scribed for Blanchie Dessert, MD by Stephania Fragmin, ED Scribe. This patient was seen in room MH06/MH06 and the patient's care was started at 4:38 PM.   Chief Complaint  Patient presents with  . Back Pain   Patient is a 34 y.o. male presenting with flank pain. The history is provided by the patient. No language interpreter was used.  Flank Pain This is a recurrent problem. The current episode started more than 2 days ago. The problem occurs constantly. The problem has been gradually worsening. Pertinent negatives include no chest pain, no abdominal pain, no headaches and no shortness of breath. Nothing aggravates the symptoms. Nothing relieves the symptoms. He has tried nothing for the symptoms.    HPI Comments: Casey Caldwell is a 34 y.o. male with a history of kidney stones and left orchiectomy and hydrocele excision 3 years ago, who presents to the Emergency Department complaining of intermittent, stabbing, severe left-sided groin pain ("where my left testicle would have been") radiating into his bilateral flanks, left greater than right, with onset 5 days ago. Patient has a history of kidney stones and states this feels like a kidney stone; his last stone was in July, which he believes he passed completely after lithotripsy. He notes associated subjective fever and diaphoresis, as well as a sensation of numbness in his right scrotum. He states he has been able to urinate, which appears "dark, but not red." Patient has taken Norco and Dilaudid at home with no relief, which is why he presents today. He states his pain is normally alleviated by Toradol, Dilaudid, and Zofran. He denies a history of any known kidney conditions. Patient has known allergies to tape.    Past Medical History  Diagnosis Date  . GERD (gastroesophageal reflux disease)   . History of kidney stones   . Seizures Childhood  . Depression     and  panic/anxiety  . Insomnia   . Kidney stones    Past Surgical History  Procedure Laterality Date  . Lithotripsy      four times over the years; most recent 10/08/12  . Appendectomy  2011  . Laparoscopic cholecystectomy  1996  . Tonsillectomy  1992  . Wrist ganglion excision  2002    Right  . Hydrocele excision Left 06/2012  . Orchiectomy Left 11/2012  . Spinal cord stimulator insertion  2015   Family History  Problem Relation Age of Onset  . Stroke Mother   . Liver disease Mother   . Irritable bowel syndrome Mother   . Kidney disease Mother   . Heart disease Father   . Colon polyps Father   . Colon cancer Neg Hx    Social History  Substance Use Topics  . Smoking status: Never Smoker   . Smokeless tobacco: Never Used  . Alcohol Use: Yes     Comment: rarely    Review of Systems  Constitutional: Positive for fever (subjective) and diaphoresis.  Respiratory: Negative for shortness of breath.   Cardiovascular: Negative for chest pain.  Gastrointestinal: Negative for abdominal pain.  Genitourinary: Positive for flank pain and testicular pain.  Musculoskeletal: Positive for back pain.  Neurological: Negative for headaches.  All other systems reviewed and are negative.     Allergies  Tape and Wellbutrin  Home Medications   Prior to Admission medications   Medication Sig Start Date End Date Taking? Authorizing Provider  docusate sodium (COLACE) 100 MG capsule Take 1 capsule (100 mg total) by mouth 2 (two) times daily. 09/26/13   Tonia Ghent, MD  escitalopram (LEXAPRO) 20 MG tablet Take 1.5 tablets (30 mg total) by mouth at bedtime. 08/01/14   Tonia Ghent, MD  HYDROcodone-acetaminophen (NORCO) 10-325 MG per tablet Take 1 tablet by mouth every 6 (six) hours as needed. 09/03/14   Tonia Ghent, MD  HYDROmorphone (DILAUDID) 2 MG tablet Take 1 tablet (2 mg total) by mouth every 4 (four) hours as needed for severe pain (for kidney stones). Sedation caution.  Call if  questions. 09/03/14   Tonia Ghent, MD  hydrOXYzine (ATARAX/VISTARIL) 10 MG tablet Take 1 tablet (10 mg total) by mouth 3 (three) times daily as needed for anxiety (sedation caution). 05/29/14   Tonia Ghent, MD  ibuprofen (ADVIL,MOTRIN) 200 MG tablet Take 800 mg by mouth every 6 (six) hours as needed for mild pain or moderate pain (chronic groin & kidney pain).     Historical Provider, MD  indapamide (LOZOL) 2.5 MG tablet Take 2.5 mg by mouth every morning.     Historical Provider, MD  ondansetron (ZOFRAN ODT) 4 MG disintegrating tablet Take 1 tablet (4 mg total) by mouth every 8 (eight) hours as needed for nausea or vomiting. 09/03/14   Tonia Ghent, MD  pantoprazole (PROTONIX) 40 MG tablet TAKE ONE TABLET BY MOUTH TWICE DAILY 08/01/14   Tonia Ghent, MD  polyethylene glycol powder (GLYCOLAX/MIRALAX) powder Take 17 g by mouth 2 (two) times daily as needed. 12/30/13   Tonia Ghent, MD  potassium chloride (K-DUR) 10 MEQ tablet Take 10 mEq by mouth 2 (two) times daily.    Historical Provider, MD  tamsulosin (FLOMAX) 0.4 MG CAPS capsule Take 1 capsule (0.4 mg total) by mouth daily. 07/15/14   April Palumbo, MD  zolpidem (AMBIEN) 10 MG tablet Take 1 tablet (10 mg total) by mouth at bedtime. 09/23/14   Tonia Ghent, MD   BP 139/91 mmHg  Pulse 98  Temp(Src) 98.2 F (36.8 C) (Oral)  Resp 22  Ht 6' (1.829 m)  Wt 305 lb (138.347 kg)  BMI 41.36 kg/m2  SpO2 96% Physical Exam  Constitutional: He is oriented to person, place, and time. He appears well-developed and well-nourished. No distress.  Patient appears uncomfortable, starting to retch in emesis bag.  HENT:  Head: Normocephalic and atraumatic.  Eyes: Conjunctivae and EOM are normal.  Neck: Neck supple. No tracheal deviation present.  Cardiovascular: Normal rate.   Pulmonary/Chest: Effort normal. No respiratory distress.  Abdominal: There is tenderness.  Suprapubic tenderness without rebound or guarding. Mild left flank pain.    Genitourinary:  Absent left testicle.  Musculoskeletal: Normal range of motion.  Neurological: He is alert and oriented to person, place, and time.  Skin: Skin is warm and dry.  Psychiatric: He has a normal mood and affect. His behavior is normal.  Nursing note and vitals reviewed.   ED Course  Procedures (including critical care time)  DIAGNOSTIC STUDIES: Oxygen Saturation is 96% on RA, normal by my interpretation.    COORDINATION OF CARE: 4:43 PM - Discussed treatment plan with pt at bedside which includes U/S. Pt verbalized understanding and agreed to plan.    Labs Review Labs Reviewed  CBC WITH DIFFERENTIAL/PLATELET - Abnormal; Notable for the following:    Hemoglobin 17.3 (*)    All other components within normal limits  BASIC METABOLIC PANEL - Abnormal; Notable for the  following:    Chloride 98 (*)    Glucose, Bld 113 (*)    All other components within normal limits  URINALYSIS, ROUTINE W REFLEX MICROSCOPIC (NOT AT [facility])    Imaging Review US Renal  10/06/2014   CLINICAL DATA:  Multiple kidney stones. Lithotripsy 3 months ago. Back pain, and flank pain over the past 5 days.  EXAM: RENAL / URINARY TRACT ULTRASOUND COMPLETE  COMPARISON:  Multiple exams, including 07/15/2014  FINDINGS: Right Kidney:  Length: 13.4 cm. Multiple nonobstructive calculi measuring up to 0. About 0.6 cm. No hydronephrosis or hydroureter. No renal mass seen.  Left Kidney:  Length: 13.4 cm. Multiple stones identified measuring up to 0.5 cm. No hydronephrosis or renal mass identified.  Bladder:  Appears normal for degree of bladder distention. No bladder stone observed.  IMPRESSION: 1. Bilateral nonobstructive nephrolithiasis, but without hydronephrosis to suggest an active obstructive process.   Electronically Signed   By: Van Clines M.D.   On: 10/06/2014 18:02   I have personally reviewed and evaluated these images and lab results as part of my medical decision-making.   EKG  Interpretation None      MDM   Final diagnoses:  Left groin pain   Pt with symptoms consistent with kidney stone with numerous in the past.  Last had lithotripsy done in June for a 58mm stone.  Denies infectious sx, or GI symptoms.  Low concern for diverticulitis and no risk factors or history suggestive of AAA.  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  Patient has chronic pain with spinal stimulator and takes Norco and a lot of at home which has somewhat helped with the pain over the last 4 days but now the pain is out of control and he is requesting IV medications. Will hydrate, treat pain and ensure no infection with UA, CBC, BMP and will get renal u/s study to further eval.  5:36 PM CBC and BMP wnl.  Pt given toradol.  6:12 PM  Urine is within normal limits and ultrasound without hydronephrosis. At this point patient's pain could be from a very small kidney stone versus exacerbation of his chronic pain with stimulator.  Patient will be discharged home to follow-up with his urologist in his pain management specialist.  I personally performed the services described in this documentation, which was scribed in my presence.  The recorded information has been reviewed and considered.      Blanchie Dessert, MD 10/06/14 1818  Blanchie Dessert, MD 10/06/14 7026

## 2014-10-07 ENCOUNTER — Encounter: Payer: Self-pay | Admitting: Family Medicine

## 2014-10-07 ENCOUNTER — Telehealth: Payer: Self-pay | Admitting: Family Medicine

## 2014-10-07 NOTE — Telephone Encounter (Signed)
Please see last mychart message.  Call pt and see what details you can get.  Thanks.

## 2014-10-08 NOTE — Telephone Encounter (Signed)
I'll keep this note on my desktop.  I'll try to get through to them in the meantime.  Please notify pt.  Thanks.

## 2014-10-08 NOTE — Telephone Encounter (Signed)
Patient notified

## 2014-10-08 NOTE — Telephone Encounter (Signed)
Patient states that he has been trying to get in contact with the pain clinic, but he is not getting any responses. Patient said that he believes the pain clinic is moving offices, and thinks that may be why he hasn't heard anything. Patient said he has a follow up appt in 2 weeks with them.

## 2014-10-08 NOTE — Telephone Encounter (Signed)
I called patient regarding his last mychart message. Patient states that he is taking Hydrocodone 40mg , and the Hydromorphone as needed, and his pain is still consistent. He said the pain just seems to taper off for a little bit, but then it continues to get worse. Patient would like to know if there is anything that can be done as far as medication? Or if there is anything you recommend him doing to help with the pain?

## 2014-10-08 NOTE — Telephone Encounter (Signed)
I understand that he is still hurting but I don't know what else to do at this point.  I need input from the pain clinic.   See if he has been in contact with them.  If he's having trouble getting through to them, then let us know.  I'll need their help from the pain clinic.  Thanks.

## 2014-10-09 ENCOUNTER — Other Ambulatory Visit: Payer: Self-pay | Admitting: Family Medicine

## 2014-10-09 ENCOUNTER — Encounter: Payer: Self-pay | Admitting: Family Medicine

## 2014-10-09 MED ORDER — HYDROCODONE-ACETAMINOPHEN 10-325 MG PO TABS: 1.0000 | ORAL_TABLET | Freq: Four times a day (QID) | ORAL | Status: DC | PRN

## 2014-10-09 MED ORDER — HYDROMORPHONE HCL 2 MG PO TABS: 2.0000 mg | ORAL_TABLET | ORAL | Status: DC | PRN

## 2014-10-09 NOTE — Telephone Encounter (Signed)
I called over to Dr. Edwina Barth clinic, awaiting return call.

## 2014-10-09 NOTE — Telephone Encounter (Addendum)
I talked with the fellow on call for the clinic.  Please call the clinic (Phone [phone]) on patient's behalf and see if they can work him in sooner with Dr. Catheryn Bacon vs [place] vs [place] and notify pt.   Thanks.

## 2014-10-09 NOTE — Telephone Encounter (Signed)
Was transferred to leave a message for appt coordinator at pain clinic about getting patient an appt sooner with Dr. Catheryn Bacon. Will await call back. Patient notified that Rx was ready for pickup.

## 2014-10-20 ENCOUNTER — Emergency Department (HOSPITAL_BASED_OUTPATIENT_CLINIC_OR_DEPARTMENT_OTHER)
Admission: EM | Admit: 2014-10-20 | Discharge: 2014-10-20 | Disposition: A | Discharge status: Home / Self Care | Payer: No Typology Code available for payment source | Attending: Physician Assistant | Admitting: Physician Assistant

## 2014-10-20 ENCOUNTER — Emergency Department (HOSPITAL_BASED_OUTPATIENT_CLINIC_OR_DEPARTMENT_OTHER): Payer: No Typology Code available for payment source

## 2014-10-20 ENCOUNTER — Encounter (HOSPITAL_BASED_OUTPATIENT_CLINIC_OR_DEPARTMENT_OTHER): Payer: Self-pay | Admitting: Emergency Medicine

## 2014-10-20 DIAGNOSIS — R109 Unspecified abdominal pain: Secondary | ICD-10-CM

## 2014-10-20 DIAGNOSIS — Z87442 Personal history of urinary calculi: Secondary | ICD-10-CM | POA: Insufficient documentation

## 2014-10-20 DIAGNOSIS — F329 Major depressive disorder, single episode, unspecified: Secondary | ICD-10-CM | POA: Insufficient documentation

## 2014-10-20 DIAGNOSIS — G40909 Epilepsy, unspecified, not intractable, without status epilepticus: Secondary | ICD-10-CM | POA: Insufficient documentation

## 2014-10-20 DIAGNOSIS — F410 Panic disorder [episodic paroxysmal anxiety] without agoraphobia: Secondary | ICD-10-CM | POA: Insufficient documentation

## 2014-10-20 DIAGNOSIS — G4700 Insomnia, unspecified: Secondary | ICD-10-CM | POA: Insufficient documentation

## 2014-10-20 DIAGNOSIS — R1032 Left lower quadrant pain: Secondary | ICD-10-CM | POA: Insufficient documentation

## 2014-10-20 DIAGNOSIS — K219 Gastro-esophageal reflux disease without esophagitis: Secondary | ICD-10-CM | POA: Insufficient documentation

## 2014-10-20 DIAGNOSIS — Z79899 Other long term (current) drug therapy: Secondary | ICD-10-CM | POA: Insufficient documentation

## 2014-10-20 LAB — COMPREHENSIVE METABOLIC PANEL
ALT: 42 U/L (ref 17–63)
AST: 26 U/L (ref 15–41)
Albumin: 4.3 g/dL (ref 3.5–5.0)
Alkaline Phosphatase: 57 U/L (ref 38–126)
Anion gap: 6 (ref 5–15)
BUN: 12 mg/dL (ref 6–20)
CO2: 28 mmol/L (ref 22–32)
Calcium: 9 mg/dL (ref 8.9–10.3)
Chloride: 104 mmol/L (ref 101–111)
Creatinine, Ser: 0.87 mg/dL (ref 0.61–1.24)
GFR calc Af Amer: >60 mL/min (ref >60)
GFR calc non Af Amer: >60 mL/min (ref >60)
Glucose, Bld: 99 mg/dL (ref 65–99)
Potassium: 3.8 mmol/L (ref 3.5–5.1)
Sodium: 138 mmol/L (ref 135–145)
Total Bilirubin: 0.6 mg/dL (ref 0.3–1.2)
Total Protein: 6.9 g/dL (ref 6.5–8.1)

## 2014-10-20 LAB — URINALYSIS, ROUTINE W REFLEX MICROSCOPIC
Bilirubin Urine: NEGATIVE
Glucose, UA: NEGATIVE mg/dL
Hgb urine dipstick: NEGATIVE
Ketones, ur: NEGATIVE mg/dL
Leukocytes, UA: NEGATIVE
Nitrite: NEGATIVE
Protein, ur: NEGATIVE mg/dL
Specific Gravity, Urine: 1.025 (ref 1.005–1.030)
Urobilinogen, UA: 1 mg/dL (ref 0.0–1.0)
pH: 6 (ref 5.0–8.0)

## 2014-10-20 LAB — CBC WITH DIFFERENTIAL/PLATELET
Basophils Absolute: 0 10*3/uL (ref 0.0–0.1)
Basophils Relative: 0 %
Eosinophils Absolute: 0.3 10*3/uL (ref 0.0–0.7)
Eosinophils Relative: 4 %
HCT: 45.4 % (ref 39.0–52.0)
Hemoglobin: 16 g/dL (ref 13.0–17.0)
Lymphocytes Relative: 30 %
Lymphs Abs: 2.6 10*3/uL (ref 0.7–4.0)
MCH: 30.1 pg (ref 26.0–34.0)
MCHC: 35.2 g/dL (ref 30.0–36.0)
MCV: 85.5 fL (ref 78.0–100.0)
Monocytes Absolute: 0.9 10*3/uL (ref 0.1–1.0)
Monocytes Relative: 10 %
Neutro Abs: 4.9 10*3/uL (ref 1.7–7.7)
Neutrophils Relative %: 56 %
Platelets: 306 10*3/uL (ref 150–400)
RBC: 5.31 MIL/uL (ref 4.22–5.81)
RDW: 12.9 % (ref 11.5–15.5)
WBC: 8.8 10*3/uL (ref 4.0–10.5)

## 2014-10-20 MED ORDER — KETOROLAC TROMETHAMINE 30 MG/ML IJ SOLN
30.0000 mg | Freq: Once | INTRAMUSCULAR | Status: AC
Start: 2014-10-20 — End: 2014-10-20
  Administered 2014-10-20: 30 mg via INTRAVENOUS

## 2014-10-20 MED ORDER — KETOROLAC TROMETHAMINE 30 MG/ML IJ SOLN
INTRAMUSCULAR | Status: AC
  Filled 2014-10-20: qty 1

## 2014-10-20 MED ORDER — HYDROCODONE-ACETAMINOPHEN 10-325 MG PO TABS
1.0000 | ORAL_TABLET | Freq: Once | ORAL | Status: DC
  Filled 2014-10-20: qty 1

## 2014-10-20 MED ORDER — ONDANSETRON 4 MG PO TBDP
4.0000 mg | ORAL_TABLET | Freq: Once | ORAL | Status: AC
  Administered 2014-10-20: 4 mg via ORAL
  Filled 2014-10-20: qty 1

## 2014-10-20 MED ORDER — SODIUM CHLORIDE 0.9 % IV BOLUS (SEPSIS)
1000.0000 mL | Freq: Once | INTRAVENOUS | Status: AC
  Administered 2014-10-20: 1000 mL via INTRAVENOUS

## 2014-10-20 MED ORDER — MORPHINE SULFATE (PF) 4 MG/ML IV SOLN
4.0000 mg | Freq: Once | INTRAVENOUS | Status: AC
  Administered 2014-10-20: 4 mg via INTRAVENOUS
  Filled 2014-10-20: qty 1

## 2014-10-20 NOTE — ED Notes (Signed)
Patient transported to CT 

## 2014-10-20 NOTE — Discharge Instructions (Signed)
Follow up with urology as planned.Return with any concerns.  Flank Pain Flank pain refers to pain that is located on the side of the body between the upper abdomen and the back. The pain may occur over a short period of time (acute) or may be long-term or reoccurring (chronic). It may be mild or severe. Flank pain can be caused by many things. CAUSES  Some of the more common causes of flank pain include:  Muscle strains.   Muscle spasms.   A disease of your spine (vertebral disk disease).   A lung infection (pneumonia).   Fluid around your lungs (pulmonary edema).   A kidney infection.   Kidney stones.   A very painful skin rash caused by the chickenpox virus (shingles).   Gallbladder disease.  [place] care will depend on the cause of your pain. In general,  Rest as directed by your caregiver.  Drink enough fluids to keep your urine clear or pale yellow.  Only take over-the-counter or prescription medicines as directed by your caregiver. Some medicines may help relieve the pain.  Tell your caregiver about any changes in your pain.  Follow up with your caregiver as directed. SEEK IMMEDIATE MEDICAL CARE IF:   Your pain is not controlled with medicine.   You have new or worsening symptoms.  Your pain increases.   You have abdominal pain.   You have shortness of breath.   You have persistent nausea or vomiting.   You have swelling in your abdomen.   You feel faint or pass out.   You have blood in your urine.  You have a fever or persistent symptoms for more than 2-3 days.  You have a fever and your symptoms suddenly get worse. MAKE SURE YOU:   Understand these instructions.  Will watch your condition.  Will get help right away if you are not doing well or get worse. Document Released: 03/03/2005 Document Revised: 10/05/2011 Document Reviewed: 08/25/2011 [facility] [employer] [facility] [employer] Patient Information 2015 [place], [place]. This  information is not intended to replace advice given to you by your health care provider. Make sure you discuss any questions you have with your health care provider.

## 2014-10-20 NOTE — ED Provider Notes (Addendum)
CSN: [id number]   Arrival date & time 10/20/14 1645  History  This chart was scribed for Casey Caldwell Julio Alm, MD by Altamease Oiler, ED Scribe. This patient was seen in room MH01/MH01 and the patient's care was started at 5:10 PM.  Chief Complaint  Patient presents with  . Groin Pain    HPI The history is provided by the patient. No language interpreter was used.   Casey Caldwell is a 34 y.o. male with PMHx of kidney stones, PSHx of left orchiectomy and hydrocele excision 3 years ago who presents to the Emergency Department complaining of constant and severe left groin pain with onset about 3 hours ago. He describes the pain as stabbing and rates it 8/10 in severity. He has this pain frequently with kidney stones. Vicodin provided insufficient pain relief at home. Associated symptoms include left flank pain. Pt has urology f/u and notes being on multiple medications including calcium citrate.  He states that in the ED he usually starts with 2 mg of Dilaudid for pain control. Pt denies fever.   Past Medical History  Diagnosis Date  . GERD (gastroesophageal reflux disease)   . History of kidney stones   . Seizures Childhood  . Depression     and panic/anxiety  . Insomnia   . Kidney stones     Past Surgical History  Procedure Laterality Date  . Lithotripsy      four times over the years; most recent 10/08/12  . Appendectomy  2011  . Laparoscopic cholecystectomy  1996  . Tonsillectomy  1992  . Wrist ganglion excision  2002    Right  . Hydrocele excision Left 06/2012  . Orchiectomy Left 11/2012  . Spinal cord stimulator insertion  2015    Family History  Problem Relation Age of Onset  . Stroke Mother   . Liver disease Mother   . Irritable bowel syndrome Mother   . Kidney disease Mother   . Heart disease Father   . Colon polyps Father   . Colon cancer Neg Hx     Social History  Substance Use Topics  . Smoking status: Never Smoker   . Smokeless tobacco: Never Used  .  Alcohol Use: Yes     Comment: rarely     Review of Systems  Constitutional: Negative for fever and chills.  Respiratory: Cough: left.   Cardiovascular: Negative for chest pain.  Gastrointestinal: Negative for nausea and vomiting.  Genitourinary: Positive for flank pain.       Left groin pain  Neurological: Negative for weakness.  All other systems reviewed and are negative.  Home Medications   Prior to Admission medications   Medication Sig Start Date End Date Taking? Authorizing Provider  OXcarbazepine (TRILEPTAL) 150 MG tablet Take 150 mg by mouth 3 (three) times daily.   Yes Historical Provider, MD  docusate sodium (COLACE) 100 MG capsule Take 1 capsule (100 mg total) by mouth 2 (two) times daily. 09/26/13   Tonia Ghent, MD  escitalopram (LEXAPRO) 20 MG tablet Take 1.5 tablets (30 mg total) by mouth at bedtime. 08/01/14   Tonia Ghent, MD  HYDROcodone-acetaminophen (NORCO) 10-325 MG per tablet Take 1 tablet by mouth every 6 (six) hours as needed. 10/09/14   Tonia Ghent, MD  HYDROmorphone (DILAUDID) 2 MG tablet Take 1 tablet (2 mg total) by mouth every 4 (four) hours as needed for severe pain (for kidney stones). Sedation caution.  Call if questions. 10/09/14   Tonia Ghent, MD  hydrOXYzine (ATARAX/VISTARIL) 10 MG tablet Take 1 tablet (10 mg total) by mouth 3 (three) times daily as needed for anxiety (sedation caution). 05/29/14   Tonia Ghent, MD  ibuprofen (ADVIL,MOTRIN) 200 MG tablet Take 800 mg by mouth every 6 (six) hours as needed for mild pain or moderate pain (chronic groin & kidney pain).     Historical Provider, MD  indapamide (LOZOL) 2.5 MG tablet Take 2.5 mg by mouth every morning.     Historical Provider, MD  ondansetron (ZOFRAN ODT) 4 MG disintegrating tablet Take 1 tablet (4 mg total) by mouth every 8 (eight) hours as needed for nausea or vomiting. 09/03/14   Tonia Ghent, MD  pantoprazole (PROTONIX) 40 MG tablet TAKE ONE TABLET BY MOUTH TWICE DAILY 08/01/14    Tonia Ghent, MD  polyethylene glycol powder (GLYCOLAX/MIRALAX) powder Take 17 g by mouth 2 (two) times daily as needed. 12/30/13   Tonia Ghent, MD  potassium chloride (K-DUR) 10 MEQ tablet Take 10 mEq by mouth 2 (two) times daily.    Historical Provider, MD  tamsulosin (FLOMAX) 0.4 MG CAPS capsule Take 1 capsule (0.4 mg total) by mouth daily. 07/15/14   April Palumbo, MD  zolpidem (AMBIEN) 10 MG tablet Take 1 tablet (10 mg total) by mouth at bedtime. 09/23/14   Tonia Ghent, MD    Allergies  Tape and Wellbutrin  Triage Vitals: BP 156/114 mmHg  Pulse 86  Temp(Src) 98.2 F (36.8 C) (Oral)  Resp 18  Ht 6' (1.829 m)  Wt 305 lb (138.347 kg)  BMI 41.36 kg/m2  SpO2 98%  Physical Exam  Constitutional: He is oriented to person, place, and time. He appears well-developed and well-nourished.  HENT:  Head: Normocephalic and atraumatic.  Eyes: EOM are normal.  Neck: Normal range of motion.  Cardiovascular: Normal rate, regular rhythm, normal heart sounds and intact distal pulses.   Pulmonary/Chest: Effort normal and breath sounds normal. No respiratory distress.  Abdominal: Soft. He exhibits no distension. There is no tenderness. There is CVA tenderness (left).  Genitourinary:  Left groin tenderness  Musculoskeletal: Normal range of motion.  Neurological: He is alert and oriented to person, place, and time.  Skin: Skin is warm and dry.  Psychiatric: He has a normal mood and affect. Judgment normal.  Nursing note and vitals reviewed.   ED Course  Procedures   DIAGNOSTIC STUDIES: Oxygen Saturation is 98% on RA, normal by my interpretation.    COORDINATION OF CARE: 5:14 PM Discussed treatment plan which includes CT A/P and pain management with pt at bedside and pt agreed to plan.  7:01 PM I re-evaluated the patient and provided an update on the results of his CT A/P.    Labs Reviewed  URINE CULTURE  URINALYSIS, ROUTINE W REFLEX MICROSCOPIC (NOT AT [facility])  CBC WITH  DIFFERENTIAL/PLATELET  COMPREHENSIVE METABOLIC PANEL    Imaging Review Ct Renal Stone Study  10/20/2014   CLINICAL DATA:  Left flank and groin pain.  EXAM: CT ABDOMEN AND PELVIS WITHOUT CONTRAST  TECHNIQUE: Multidetector CT imaging of the abdomen and pelvis was performed following the standard protocol without IV contrast.  COMPARISON:  CT scan dated 07/15/2014  FINDINGS: Lower chest:  Normal.  Hepatobiliary: Diffuse hepatic steatosis. No focal lesions. Gallbladder has been removed. No bile duct dilatation.  Pancreas: Normal.  Spleen: Normal.  Adrenals/Urinary Tract: The adrenal glands are normal. There are several tiny stones in each kidney. No hydronephrosis. The ureters and bladder appear normal.  Stomach/Bowel:  Normal.  Vascular/Lymphatic: Normal.  Reproductive: Normal.  Other: No free air or free fluid.  Musculoskeletal: No significant abnormality. There is a epidural catheter and pump in place.  IMPRESSION: Hepatic steatosis. No acute abnormality. There are several tiny calcifications in both kidneys.   Electronically Signed   By: Lorriane Shire M.D.   On: 10/20/2014 17:43      MDM   Final diagnoses:  Left flank pain   Patient is a 34 year old male with past medical history significant for kidney stones. He follows with urology as an outpatient. He's been here multiple times for kidney stone pain in the past. He has Vicodin at home which he uses treat this pain.  He states this pain started 3 hours ago radiating to his left groin. He states he is unable to tolerate the pain at home.  We will get CT stone study to make sure there is no obstructing stone. If not, we'll plan on having sending patient home   Patient had no obstructing stone. We will have him take his regular pain medication at home to relieve the symptoms.    I personally performed the services described in this documentation, which was scribed in my presence. The recorded information has been reviewed and is  accurate.   Jesselle Laflamme Julio Alm, MD 10/20/14 [place], MD 10/20/14 2320

## 2014-10-20 NOTE — ED Notes (Addendum)
Patient states that he is in 10/10 pain and needs something else for pain. The patient is playing on cell phone without any distress. MD aware, orders carried out

## 2014-10-20 NOTE — ED Notes (Signed)
Left groin pain, history of kidney stones.  Pt states typical of kidney stone pain.  Some nausea.  No vomiting.

## 2014-10-22 LAB — URINE CULTURE: Culture: 5000

## 2014-11-03 ENCOUNTER — Other Ambulatory Visit: Payer: Self-pay | Admitting: Family Medicine

## 2014-11-03 ENCOUNTER — Encounter: Payer: Self-pay | Admitting: Family Medicine

## 2014-11-03 NOTE — Telephone Encounter (Signed)
Sent.  Done.  Thanks.   I sent patient mychart message.

## 2014-11-03 NOTE — Telephone Encounter (Signed)
Script was printed at office visit. Is it okay tor refill?

## 2014-11-04 ENCOUNTER — Ambulatory Visit: Payer: No Typology Code available for payment source | Admitting: Family Medicine

## 2014-11-05 ENCOUNTER — Encounter: Payer: Self-pay | Admitting: Family Medicine

## 2014-11-05 ENCOUNTER — Ambulatory Visit (INDEPENDENT_AMBULATORY_CARE_PROVIDER_SITE_OTHER): Payer: Self-pay | Admitting: Family Medicine

## 2014-11-05 VITALS — BP 104/74 | HR 82 | Temp 98.4°F | Wt 312.0 lb

## 2014-11-05 DIAGNOSIS — R1030 Lower abdominal pain, unspecified: Secondary | ICD-10-CM

## 2014-11-05 MED ORDER — HYDROMORPHONE HCL 2 MG PO TABS: 2.0000 mg | ORAL_TABLET | ORAL | Status: DC | PRN

## 2014-11-05 MED ORDER — HYDROCODONE-ACETAMINOPHEN 10-325 MG PO TABS: 1.0000 | ORAL_TABLET | Freq: Four times a day (QID) | ORAL | Status: DC | PRN

## 2014-11-05 NOTE — Progress Notes (Signed)
Pre visit review using our clinic review tool, if applicable. No additional management support is needed unless otherwise documented below in the visit note.  Finances are tight for patient.   Continues with pain in lower back.   He saw pain clinic and didn't tolerate trileptal, due to insomnia and worsening mood.    Sleeping better off trileptal, but still not sleeping well.   No ADE on current meds, but pain is still not well controlled.   He is miserable from the pain.   No SI.   He is trying to manage GI sx (constipation) with colace.    It is noted that he has a new type of pain now (his prev pain was improved with the stimulator and this feels different per patient report).  He has L sided sciatica and lower back pain, along with pain radiating to the groin B.  Has a new numbness/change in sensation in the L quad area.  No weakness but ROM in back and walking are affected by pain.  No trauma.   Meds, vitals, and allergies reviewed.   ROS: See HPI.  Otherwise, noncontributory.  He isn't sedated.  Speech wnl.  Judgement intact.   nad but uncomfortable Mmm rrr ctab abd soft, not ttp Ext w/o edema.  Altered sensation noted in the L quad area but not a total loss of sensation.  Able to bear weight on BLE, strength still wnl B distally.  Pain with back extension. Pain on palpation of L lower back.  No bruising no rash.

## 2014-11-05 NOTE — Patient Instructions (Signed)
Don't change your meds for now. Let me talk to the pain clinic.   Take care. Glad to see you.

## 2014-11-06 ENCOUNTER — Encounter: Payer: Self-pay | Admitting: Family Medicine

## 2014-11-06 NOTE — Assessment & Plan Note (Signed)
He didn't tolerate trileptal.  I filled his meds per routine.  D/w pt about safe use and dosing.   I called pain clinic for input.  I am worried that he has multiple issues here- the prev orchitis with possible phantom pain component, the pain addressed by the stimulator, and now with new radicular pain in the L leg causing numbness in the quad.   I didn't change his meds at this point.   I will ask for input on imaging given that he has a stimulator.   I will await call back from pain clinic.  App help of all involved.  >25 minutes spent in face to face time with patient, >50% spent in counselling or coordination of care.

## 2014-11-10 ENCOUNTER — Telehealth: Payer: Self-pay | Admitting: Family Medicine

## 2014-11-10 NOTE — Telephone Encounter (Signed)
Patient notified as instructed by telephone and verbalized understanding. 

## 2014-11-10 NOTE — Telephone Encounter (Signed)
Please call pt.  I talked with Dr. Catheryn Bacon.  His clinic will be in touch with patient about injection and possible med changes for his pain.  He may be able to get the injection done without extra imaging.  I'll await pain clinic input, but I think this is the best way to go.  I greatly appreciate Dr. Edwina Barth input and I thanked him on the call.

## 2014-12-04 ENCOUNTER — Encounter: Payer: Self-pay | Admitting: Family Medicine

## 2014-12-05 ENCOUNTER — Other Ambulatory Visit: Payer: Self-pay | Admitting: Family Medicine

## 2014-12-05 MED ORDER — ZOLPIDEM TARTRATE 10 MG PO TABS
10.0000 mg | ORAL_TABLET | Freq: Every day | ORAL | Status: DC
Start: 2014-12-05 — End: 2015-03-11

## 2014-12-05 MED ORDER — HYDROCODONE-ACETAMINOPHEN 10-325 MG PO TABS: 1.0000 | ORAL_TABLET | Freq: Four times a day (QID) | ORAL | Status: DC | PRN

## 2014-12-05 MED ORDER — HYDROMORPHONE HCL 2 MG PO TABS: 2.0000 mg | ORAL_TABLET | ORAL | Status: DC | PRN

## 2014-12-05 MED ORDER — ONDANSETRON 4 MG PO TBDP
4.0000 mg | ORAL_TABLET | Freq: Three times a day (TID) | ORAL | Status: DC | PRN
Start: 2014-12-05 — End: 2015-03-11

## 2014-12-05 NOTE — Telephone Encounter (Signed)
Lm on pts vm and informed him Rx is available for pickup from the front desk. Pt advised third party unable to pickup  

## 2014-12-11 ENCOUNTER — Encounter: Payer: Self-pay | Admitting: Family Medicine

## 2014-12-17 ENCOUNTER — Other Ambulatory Visit: Payer: Self-pay | Admitting: Family Medicine

## 2014-12-17 ENCOUNTER — Other Ambulatory Visit: Payer: Self-pay

## 2014-12-17 NOTE — Telephone Encounter (Signed)
Pt left v/m requesting refill zolpidem; spoke with Helene Kelp at [facility] and received phone in 12/05/14 for zolpidem and will get ready for pick up today.left v/m per DPR to ck with pharmacy for zolpidem pick up.

## 2014-12-21 NOTE — Telephone Encounter (Signed)
I think this was already done with a fill on/after date.  Please let me know if this didn't get called in prev.  Thanks.

## 2014-12-22 ENCOUNTER — Encounter: Payer: Self-pay | Admitting: Family Medicine

## 2014-12-22 ENCOUNTER — Telehealth: Payer: Self-pay | Admitting: *Deleted

## 2014-12-22 MED ORDER — PANTOPRAZOLE SODIUM 40 MG PO TBEC: DELAYED_RELEASE_TABLET | ORAL | Status: DC

## 2014-12-22 NOTE — Telephone Encounter (Signed)
[facility] Patient Name: Casey Caldwell Gender: Male DOB: [birth year] Age: 34 Y 26 M 17 D Return Phone Number: [phone] (Primary) Address: City/State/Zip: [place] Client [place] Night - Client Client Site [place] Physician Renford Dills Contact Type Call Call Type Triage / Clinical Relationship To Patient Self Return Phone Number [phone] (Primary) Chief Complaint Prescription Refill or Medication Request (non symptomatic) Initial Comment Caller states their prescription wasn't called in. Nurse Assessment Guidelines Guideline Title Affirmed Question Affirmed Notes Nurse Date/Time (Eastern Time) Disp. Time Eilene Ghazi Time) Disposition Final User 12/19/2014 8:50:05 PM Attempt made - message left Rowan Blase 12/19/2014 9:18:08 PM FINAL ATTEMPT MADE - message left Yes Germain Osgood RN, Opal Sidles

## 2014-12-22 NOTE — Telephone Encounter (Signed)
Please call pt.  It looks like the Casey Caldwell was already called in, with a fill on/after date.  Please check with the pharmacy.  Thanks.

## 2014-12-22 NOTE — Telephone Encounter (Signed)
[facility] Call [facility] Patient Name: SHORTY SCHOENE Gender: Male DOB: May 28, 1980 Age: 34 Y 49 M 14 D Return Phone Number: [phone] (Primary) Address: City/State/Zip: [place] Client [place] [facility] - Client Client Site [place] Physician Renford Dills Contact Type Call Caller Name [facility] [place] Phone Number [phone] Relationship To Patient Provider Is this call to report lab results? No Call Type General Information Initial Comment Caller states she is needing to speak with the Dr. in regards to a pt's prescription. General Information Type Message Only Nurse Assessment Guidelines Guideline Title Affirmed Question Affirmed Notes Nurse Date/Time (Eastern Time) Disp. Time Eilene Ghazi Time) Disposition Final User 12/17/2014 6:32:57 PM General Information Provided Yes Vicente Males After Care Instructions Given Call Event Type User Date / Time Description

## 2014-12-22 NOTE — Telephone Encounter (Signed)
Looks like Rena phoned this in previously on 12/05/14 for fill on or after 12/17/14.

## 2014-12-23 NOTE — Telephone Encounter (Signed)
Pt sent mychart message again about this.  Has it been handled?  Let me/him know. Thanks.

## 2014-12-24 NOTE — Telephone Encounter (Signed)
Please close encounter if completed

## 2014-12-26 ENCOUNTER — Encounter: Payer: Self-pay | Admitting: Family Medicine

## 2015-01-08 ENCOUNTER — Encounter: Payer: Self-pay | Admitting: Family Medicine

## 2015-01-09 ENCOUNTER — Encounter (HOSPITAL_BASED_OUTPATIENT_CLINIC_OR_DEPARTMENT_OTHER): Payer: Self-pay | Admitting: *Deleted

## 2015-01-09 ENCOUNTER — Emergency Department (HOSPITAL_BASED_OUTPATIENT_CLINIC_OR_DEPARTMENT_OTHER)
Admission: EM | Admit: 2015-01-09 | Discharge: 2015-01-09 | Disposition: A | Discharge status: Home / Self Care | Payer: No Typology Code available for payment source | Attending: Emergency Medicine | Admitting: Emergency Medicine

## 2015-01-09 ENCOUNTER — Emergency Department (HOSPITAL_BASED_OUTPATIENT_CLINIC_OR_DEPARTMENT_OTHER): Payer: No Typology Code available for payment source

## 2015-01-09 ENCOUNTER — Other Ambulatory Visit: Payer: Self-pay | Admitting: Family Medicine

## 2015-01-09 DIAGNOSIS — M549 Dorsalgia, unspecified: Secondary | ICD-10-CM | POA: Insufficient documentation

## 2015-01-09 DIAGNOSIS — F329 Major depressive disorder, single episode, unspecified: Secondary | ICD-10-CM | POA: Insufficient documentation

## 2015-01-09 DIAGNOSIS — Z9049 Acquired absence of other specified parts of digestive tract: Secondary | ICD-10-CM | POA: Insufficient documentation

## 2015-01-09 DIAGNOSIS — Z79899 Other long term (current) drug therapy: Secondary | ICD-10-CM | POA: Insufficient documentation

## 2015-01-09 DIAGNOSIS — F410 Panic disorder [episodic paroxysmal anxiety] without agoraphobia: Secondary | ICD-10-CM | POA: Insufficient documentation

## 2015-01-09 DIAGNOSIS — R109 Unspecified abdominal pain: Secondary | ICD-10-CM | POA: Insufficient documentation

## 2015-01-09 DIAGNOSIS — K219 Gastro-esophageal reflux disease without esophagitis: Secondary | ICD-10-CM | POA: Insufficient documentation

## 2015-01-09 DIAGNOSIS — Z87442 Personal history of urinary calculi: Secondary | ICD-10-CM | POA: Insufficient documentation

## 2015-01-09 DIAGNOSIS — G4700 Insomnia, unspecified: Secondary | ICD-10-CM | POA: Insufficient documentation

## 2015-01-09 DIAGNOSIS — G8929 Other chronic pain: Secondary | ICD-10-CM | POA: Insufficient documentation

## 2015-01-09 LAB — CBC
HCT: 48.4 % (ref 39.0–52.0)
Hemoglobin: 16.8 g/dL (ref 13.0–17.0)
MCH: 29.9 pg (ref 26.0–34.0)
MCHC: 34.7 g/dL (ref 30.0–36.0)
MCV: 86.3 fL (ref 78.0–100.0)
Platelets: 303 10*3/uL (ref 150–400)
RBC: 5.61 MIL/uL (ref 4.22–5.81)
RDW: 13.6 % (ref 11.5–15.5)
WBC: 9.8 10*3/uL (ref 4.0–10.5)

## 2015-01-09 LAB — COMPREHENSIVE METABOLIC PANEL
ALT: 47 U/L (ref 17–63)
AST: 33 U/L (ref 15–41)
Albumin: 4.6 g/dL (ref 3.5–5.0)
Alkaline Phosphatase: 65 U/L (ref 38–126)
Anion gap: 11 (ref 5–15)
BUN: 12 mg/dL (ref 6–20)
CO2: 24 mmol/L (ref 22–32)
Calcium: 9.8 mg/dL (ref 8.9–10.3)
Chloride: 103 mmol/L (ref 101–111)
Creatinine, Ser: 0.85 mg/dL (ref 0.61–1.24)
GFR calc Af Amer: >60 mL/min (ref >60)
GFR calc non Af Amer: >60 mL/min (ref >60)
Glucose, Bld: 94 mg/dL (ref 65–99)
Potassium: 4 mmol/L (ref 3.5–5.1)
Sodium: 138 mmol/L (ref 135–145)
Total Bilirubin: 1.1 mg/dL (ref 0.3–1.2)
Total Protein: 7.4 g/dL (ref 6.5–8.1)

## 2015-01-09 LAB — URINALYSIS, ROUTINE W REFLEX MICROSCOPIC
Bilirubin Urine: NEGATIVE
Glucose, UA: NEGATIVE mg/dL
Hgb urine dipstick: NEGATIVE
Ketones, ur: NEGATIVE mg/dL
Leukocytes, UA: NEGATIVE
Nitrite: NEGATIVE
Protein, ur: NEGATIVE mg/dL
Specific Gravity, Urine: 1.024 (ref 1.005–1.030)
pH: 5.5 (ref 5.0–8.0)

## 2015-01-09 LAB — LIPASE, BLOOD: Lipase: 36 U/L (ref 11–51)

## 2015-01-09 MED ORDER — HYDROMORPHONE HCL 1 MG/ML IJ SOLN
2.0000 mg | Freq: Once | INTRAMUSCULAR | Status: AC
  Administered 2015-01-09: 2 mg via INTRAVENOUS
  Filled 2015-01-09: qty 2

## 2015-01-09 MED ORDER — SODIUM CHLORIDE 0.9 % IV BOLUS (SEPSIS)
1000.0000 mL | Freq: Once | INTRAVENOUS | Status: AC
  Administered 2015-01-09: 1000 mL via INTRAVENOUS

## 2015-01-09 MED ORDER — KETOROLAC TROMETHAMINE 30 MG/ML IJ SOLN
30.0000 mg | Freq: Once | INTRAMUSCULAR | Status: AC
  Administered 2015-01-09: 30 mg via INTRAVENOUS
  Filled 2015-01-09: qty 1

## 2015-01-09 MED ORDER — ONDANSETRON HCL 4 MG/2ML IJ SOLN
4.0000 mg | Freq: Once | INTRAMUSCULAR | Status: AC | PRN
  Administered 2015-01-09: 4 mg via INTRAVENOUS
  Filled 2015-01-09: qty 2

## 2015-01-09 MED ORDER — HYDROCODONE-ACETAMINOPHEN 10-325 MG PO TABS: 1.0000 | ORAL_TABLET | Freq: Four times a day (QID) | ORAL | Status: DC | PRN

## 2015-01-09 NOTE — ED Provider Notes (Signed)
CSN: [id number]     Arrival date & time 01/09/15  1511 History   First MD Initiated Contact with Patient 01/09/15 1532     Chief Complaint  Patient presents with  . Flank Pain     (Consider location/radiation/quality/duration/timing/severity/associated sxs/prior Treatment) HPI  Pt presenting with left flank that radiates to left groin.  He states this feels simlar to prior kidney stones.  He states the pain has been intermittent for the past week, but constant for the past 2 hours.  No fever.  Has some nausea but no vomiting.  Had one loose bowel movement.  Some dysuria, but no gross hematuria, no frequency.  He also has a spinal cord stimulator for chronic back pain- he states this pain feels different than his chronic back pain.  No weaeknss of legs or urinary retention. There are no other associated systemic symptoms, there are no other alleviating or modifying factors.   Past Medical History  Diagnosis Date  . GERD (gastroesophageal reflux disease)   . History of kidney stones   . Seizures ([place]) Childhood  . Depression     and panic/anxiety  . Insomnia   . Kidney stones    Past Surgical History  Procedure Laterality Date  . Lithotripsy      four times over the years; most recent 10/08/12  . Appendectomy  2011  . Laparoscopic cholecystectomy  1996  . Tonsillectomy  1992  . Wrist ganglion excision  2002    Right  . Hydrocele excision Left 06/2012  . Orchiectomy Left 11/2012  . Spinal cord stimulator insertion  2015   Family History  Problem Relation Age of Onset  . Stroke Mother   . Liver disease Mother   . Irritable bowel syndrome Mother   . Kidney disease Mother   . Heart disease Father   . Colon polyps Father   . Colon cancer Neg Hx    Social History  Substance Use Topics  . Smoking status: Never Smoker   . Smokeless tobacco: Never Used  . Alcohol Use: 0.0 oz/week    0 Standard drinks or equivalent per week     Comment: rarely    Review of Systems  ROS  reviewed and all otherwise negative except for mentioned in HPI    Allergies  Tape; Trileptal; and Wellbutrin  Home Medications   Prior to Admission medications   Medication Sig Start Date End Date Taking? Authorizing Provider  docusate sodium (COLACE) 100 MG capsule Take 1 capsule (100 mg total) by mouth 2 (two) times daily. 09/26/13   Tonia Ghent, MD  escitalopram (LEXAPRO) 20 MG tablet Take 2 tablets (40 mg total) by mouth daily. 11/03/14   Tonia Ghent, MD  HYDROcodone-acetaminophen (NORCO) 10-325 MG tablet Take 1 tablet by mouth every 6 (six) hours as needed. 01/09/15   Tonia Ghent, MD  HYDROmorphone (DILAUDID) 2 MG tablet Take 1 tablet (2 mg total) by mouth every 4 (four) hours as needed for severe pain (for kidney stones). Sedation caution.  Call if questions. 12/05/14   Tonia Ghent, MD  hydrOXYzine (ATARAX/VISTARIL) 10 MG tablet Take 1 tablet (10 mg total) by mouth 3 (three) times daily as needed for anxiety (sedation caution). 05/29/14   Tonia Ghent, MD  ibuprofen (ADVIL,MOTRIN) 200 MG tablet Take 800 mg by mouth every 6 (six) hours as needed for mild pain or moderate pain (chronic groin & kidney pain).     Historical Provider, MD  indapamide (LOZOL) 2.5 MG  tablet Take 2.5 mg by mouth every morning.     Historical Provider, MD  ondansetron (ZOFRAN ODT) 4 MG disintegrating tablet Take 1 tablet (4 mg total) by mouth every 8 (eight) hours as needed for nausea or vomiting. 12/05/14   Tonia Ghent, MD  pantoprazole (PROTONIX) 40 MG tablet TAKE ONE TABLET BY MOUTH TWICE DAILY 12/22/14   Tonia Ghent, MD  polyethylene glycol powder (GLYCOLAX/MIRALAX) powder Take 17 g by mouth 2 (two) times daily as needed. 12/30/13   Tonia Ghent, MD  potassium chloride (K-DUR) 10 MEQ tablet Take 10 mEq by mouth 2 (two) times daily.    Historical Provider, MD  tamsulosin (FLOMAX) 0.4 MG CAPS capsule Take 1 capsule (0.4 mg total) by mouth daily. 07/15/14   April Palumbo, MD  zolpidem  (AMBIEN) 10 MG tablet Take 1 tablet (10 mg total) by mouth at bedtime. Fill on/after 12/17/14 12/05/14   Tonia Ghent, MD   BP 140/97 mmHg  Pulse 69  Temp(Src) 97.4 F (36.3 C) (Oral)  Resp 18  Ht 6' (1.829 m)  Wt 305 lb (138.347 kg)  BMI 41.36 kg/m2  SpO2 96%  Vitals reviewed Physical Exam  Physical Examination: General appearance - alert, well appearing, and in no distress Mental status - alert, oriented to person, place, and time Eyes - no conjunctival injection, no scleral icterus Mouth - mucous membranes moist, pharynx normal without lesions Chest - clear to auscultation, no wheezes, rales or rhonchi, symmetric air entry Heart - normal rate, regular rhythm, normal S1, S2, no murmurs, rubs, clicks or gallops Abdomen - soft, nontender, nondistended, no masses or organomegaly Back exam - full range of motion, no tenderness, palpable spasm or pain on motion, ttp over left CVA region Neurological - alert, oriented, normal speech, strength 5/5 in extremities x 4, sensation intact, gait normal Extremities - peripheral pulses normal, no pedal edema, no clubbing or cyanosis Skin - normal coloration and turgor, no rashes  ED Course  Procedures (including critical care time) Labs Review Labs Reviewed  URINALYSIS, ROUTINE W REFLEX MICROSCOPIC (NOT AT [facility])  LIPASE, BLOOD  COMPREHENSIVE METABOLIC PANEL  CBC    Imaging Review US Renal  01/09/2015  CLINICAL DATA:  History of renal stones, left flank pain for 2 days EXAM: RENAL / URINARY TRACT ULTRASOUND COMPLETE COMPARISON:  10/20/2014 FINDINGS: Right Kidney: Length: 12.9 cm. Small echogenic focus is noted in the mid to upper pole of the right kidney consistent with renal calculi. No obstructive changes are noted. Left Kidney: Length: 13.1 cm. Small nonobstructing left renal stone is noted similar to that seen on recent CT. Bladder: Appears normal for degree of bladder distention. IMPRESSION: Bilateral renal calculi similar to that  noted on prior CT examination. No obstructive changes are noted. Electronically Signed   By: Inez Catalina M.D.   On: 01/09/2015 16:34   I have personally reviewed and evaluated these images and lab results as part of my medical decision-making.   EKG Interpretation None      MDM   Final diagnoses:  Flank pain    Pt presenting with c/o left flank pain radiating to left groin.  Workup reveals reassuring labs- urine is also negative for RBCs or signs of infection.  Renal ultrasound shows no obtstruction. Pt treated for pain and feels improved.  Pt advised to f/u with urology and with his PMD for his home pain medications.  Discharged with strict return precautions.  Pt agreeable with plan.    Jana Half  Canary Brim, MD 01/09/15 [phone]

## 2015-01-09 NOTE — ED Notes (Signed)
Left flank pain for a week on and off. Steady pain x 2 hours. Hx of kidney stones and pt states this pain feels like a stone.

## 2015-01-09 NOTE — Discharge Instructions (Signed)
Return to the ED with any concerns including vomiting and not able to keep down liquids, fever/chills, decreased level of alertness/lethargy, or any other alarming symptoms °

## 2015-01-26 ENCOUNTER — Encounter: Payer: Self-pay | Admitting: Family Medicine

## 2015-01-28 ENCOUNTER — Ambulatory Visit (INDEPENDENT_AMBULATORY_CARE_PROVIDER_SITE_OTHER): Payer: No Typology Code available for payment source | Admitting: Family Medicine

## 2015-01-28 ENCOUNTER — Encounter: Payer: Self-pay | Admitting: Family Medicine

## 2015-01-28 VITALS — BP 110/70 | HR 96 | Temp 98.2°F | Wt 308.2 lb

## 2015-01-28 DIAGNOSIS — F418 Other specified anxiety disorders: Secondary | ICD-10-CM

## 2015-01-28 DIAGNOSIS — F419 Anxiety disorder, unspecified: Secondary | ICD-10-CM

## 2015-01-28 DIAGNOSIS — F32A Depression, unspecified: Secondary | ICD-10-CM

## 2015-01-28 DIAGNOSIS — J0110 Acute frontal sinusitis, unspecified: Secondary | ICD-10-CM

## 2015-01-28 DIAGNOSIS — F329 Major depressive disorder, single episode, unspecified: Secondary | ICD-10-CM

## 2015-01-28 DIAGNOSIS — N200 Calculus of kidney: Secondary | ICD-10-CM

## 2015-01-28 DIAGNOSIS — R1030 Lower abdominal pain, unspecified: Secondary | ICD-10-CM

## 2015-01-28 MED ORDER — HYDROCODONE-ACETAMINOPHEN 10-325 MG PO TABS: 1.0000 | ORAL_TABLET | Freq: Four times a day (QID) | ORAL | Status: DC | PRN

## 2015-01-28 MED ORDER — DOXYCYCLINE HYCLATE 100 MG PO TABS: 100.0000 mg | ORAL_TABLET | Freq: Two times a day (BID) | ORAL | Status: DC

## 2015-01-28 MED ORDER — ESCITALOPRAM OXALATE 20 MG PO TABS: 40.0000 mg | ORAL_TABLET | Freq: Every day | ORAL | Status: DC

## 2015-01-28 MED ORDER — HYDROMORPHONE HCL 2 MG PO TABS: 2.0000 mg | ORAL_TABLET | ORAL | Status: DC | PRN

## 2015-01-28 NOTE — Progress Notes (Signed)
Pre visit review using our clinic review tool, if applicable. No additional management support is needed unless otherwise documented below in the visit note.  He is still having lower back pain.  Injection per pain clinic helped prev but then returned.  He is going to be checking with the pain clinic when possible about follow up.    He got sick while in [place] and his symptoms continue.  Stuffy.  Thick secretions from the nose.  His ear pain is better then prev.  Prev with cough, better now.  Taking mucinex, vitamin C, fluids.    He passed another 3 stones since the last ER visit.   Meds, vitals, and allergies reviewed.   ROS: See HPI.  Otherwise, noncontributory.  GEN: nad, alert and oriented HEENT: mucous membranes moist, tm w/o erythema, nasal exam w/o erythema, clear discharge noted,  OP with cobblestoning, frontal sinuses ttp NECK: supple w/o LA CV: rrr.   PULM: ctab, no inc wob EXT: no edema SKIN: no acute rash

## 2015-01-28 NOTE — Patient Instructions (Signed)
Start doxy, take it twice a day.  Plenty of fluids, try to get some rest. Take care.  Glad to see you.

## 2015-01-29 DIAGNOSIS — J0110 Acute frontal sinusitis, unspecified: Secondary | ICD-10-CM | POA: Insufficient documentation

## 2015-01-29 NOTE — Assessment & Plan Note (Signed)
He'll check about f/u with the pain clinic.  I filled his hydrocodone today, with fill on/after date noted.  Update me as needed.

## 2015-01-29 NOTE — Assessment & Plan Note (Signed)
Doxy, fluids, f/u prn.  Nontoxic.  Okay for outpatient f/u.

## 2015-01-29 NOTE — Assessment & Plan Note (Signed)
Continue SSRI, no SI/HI.

## 2015-01-29 NOTE — Assessment & Plan Note (Signed)
rx given for dilaudid for his next round of stones, given his hx.

## 2015-01-30 ENCOUNTER — Telehealth: Payer: Self-pay

## 2015-01-30 NOTE — Telephone Encounter (Signed)
Casey Caldwell with [employer] [place] left v/m requesting cb with verification of instructions for escitalopram 20 mg with instruction 2 tabs daily. Casey Caldwell said escitalopram 20 mg is max daily dose. Casey Caldwell request cb.

## 2015-01-30 NOTE — Telephone Encounter (Signed)
This sig was correct.  He had needed and tolerated that dose prev.  Please thank them for the call and let me know if they need me on the phone.  Thanks.

## 2015-01-30 NOTE — Telephone Encounter (Signed)
Spoke to [employer] gave verbal of for sig

## 2015-02-08 ENCOUNTER — Emergency Department (HOSPITAL_BASED_OUTPATIENT_CLINIC_OR_DEPARTMENT_OTHER): Payer: No Typology Code available for payment source

## 2015-02-08 ENCOUNTER — Encounter (HOSPITAL_BASED_OUTPATIENT_CLINIC_OR_DEPARTMENT_OTHER): Payer: Self-pay | Admitting: Emergency Medicine

## 2015-02-08 ENCOUNTER — Emergency Department (HOSPITAL_BASED_OUTPATIENT_CLINIC_OR_DEPARTMENT_OTHER)
Admission: EM | Admit: 2015-02-08 | Discharge: 2015-02-08 | Disposition: A | Discharge status: Home / Self Care | Payer: No Typology Code available for payment source | Attending: Emergency Medicine | Admitting: Emergency Medicine

## 2015-02-08 DIAGNOSIS — N50812 Left testicular pain: Secondary | ICD-10-CM | POA: Insufficient documentation

## 2015-02-08 DIAGNOSIS — N50819 Testicular pain, unspecified: Secondary | ICD-10-CM

## 2015-02-08 DIAGNOSIS — Z79899 Other long term (current) drug therapy: Secondary | ICD-10-CM | POA: Insufficient documentation

## 2015-02-08 DIAGNOSIS — K219 Gastro-esophageal reflux disease without esophagitis: Secondary | ICD-10-CM | POA: Insufficient documentation

## 2015-02-08 DIAGNOSIS — F329 Major depressive disorder, single episode, unspecified: Secondary | ICD-10-CM | POA: Insufficient documentation

## 2015-02-08 DIAGNOSIS — G4700 Insomnia, unspecified: Secondary | ICD-10-CM | POA: Insufficient documentation

## 2015-02-08 DIAGNOSIS — R109 Unspecified abdominal pain: Secondary | ICD-10-CM

## 2015-02-08 DIAGNOSIS — Z87442 Personal history of urinary calculi: Secondary | ICD-10-CM | POA: Insufficient documentation

## 2015-02-08 DIAGNOSIS — F419 Anxiety disorder, unspecified: Secondary | ICD-10-CM | POA: Insufficient documentation

## 2015-02-08 LAB — CBC WITH DIFFERENTIAL/PLATELET
Basophils Absolute: 0 10*3/uL (ref 0.0–0.1)
Basophils Relative: 0 %
Eosinophils Absolute: 0.1 10*3/uL (ref 0.0–0.7)
Eosinophils Relative: 1 %
HCT: 49.5 % (ref 39.0–52.0)
Hemoglobin: 17.4 g/dL — ABNORMAL HIGH (ref 13.0–17.0)
Lymphocytes Relative: 30 %
Lymphs Abs: 2.9 10*3/uL (ref 0.7–4.0)
MCH: 30.4 pg (ref 26.0–34.0)
MCHC: 35.2 g/dL (ref 30.0–36.0)
MCV: 86.5 fL (ref 78.0–100.0)
Monocytes Absolute: 0.9 10*3/uL (ref 0.1–1.0)
Monocytes Relative: 9 %
Neutro Abs: 5.7 10*3/uL (ref 1.7–7.7)
Neutrophils Relative %: 60 %
Platelets: 344 10*3/uL (ref 150–400)
RBC: 5.72 MIL/uL (ref 4.22–5.81)
RDW: 13.5 % (ref 11.5–15.5)
WBC: 9.7 10*3/uL (ref 4.0–10.5)

## 2015-02-08 LAB — BASIC METABOLIC PANEL
Anion gap: 10 (ref 5–15)
BUN: 9 mg/dL (ref 6–20)
CO2: 24 mmol/L (ref 22–32)
Calcium: 9.5 mg/dL (ref 8.9–10.3)
Chloride: 103 mmol/L (ref 101–111)
Creatinine, Ser: 0.77 mg/dL (ref 0.61–1.24)
GFR calc Af Amer: >60 mL/min (ref >60)
GFR calc non Af Amer: >60 mL/min (ref >60)
Glucose, Bld: 97 mg/dL (ref 65–99)
Potassium: 4.1 mmol/L (ref 3.5–5.1)
Sodium: 137 mmol/L (ref 135–145)

## 2015-02-08 LAB — URINALYSIS, ROUTINE W REFLEX MICROSCOPIC
Bilirubin Urine: NEGATIVE
Glucose, UA: NEGATIVE mg/dL
Hgb urine dipstick: NEGATIVE
Ketones, ur: NEGATIVE mg/dL
Leukocytes, UA: NEGATIVE
Nitrite: NEGATIVE
Protein, ur: NEGATIVE mg/dL
Specific Gravity, Urine: 1.021 (ref 1.005–1.030)
pH: 6.5 (ref 5.0–8.0)

## 2015-02-08 MED ORDER — ONDANSETRON HCL 4 MG/2ML IJ SOLN
4.0000 mg | Freq: Once | INTRAMUSCULAR | Status: AC
  Administered 2015-02-08: 4 mg via INTRAVENOUS
  Filled 2015-02-08: qty 2

## 2015-02-08 MED ORDER — KETOROLAC TROMETHAMINE 30 MG/ML IJ SOLN
30.0000 mg | Freq: Once | INTRAMUSCULAR | Status: AC
  Administered 2015-02-08: 30 mg via INTRAVENOUS
  Filled 2015-02-08: qty 1

## 2015-02-08 MED ORDER — ONDANSETRON HCL 4 MG PO TABS: 4.0000 mg | ORAL_TABLET | Freq: Four times a day (QID) | ORAL | Status: DC

## 2015-02-08 MED ORDER — NAPROXEN 250 MG PO TABS
500.0000 mg | ORAL_TABLET | Freq: Once | ORAL | Status: AC
  Administered 2015-02-08: 500 mg via ORAL
  Filled 2015-02-08: qty 2

## 2015-02-08 MED ORDER — HYDROMORPHONE HCL 1 MG/ML IJ SOLN
1.0000 mg | Freq: Once | INTRAMUSCULAR | Status: AC
  Administered 2015-02-08: 1 mg via INTRAVENOUS
  Filled 2015-02-08: qty 1

## 2015-02-08 MED ORDER — SODIUM CHLORIDE 0.9 % IV BOLUS (SEPSIS)
1000.0000 mL | Freq: Once | INTRAVENOUS | Status: AC
  Administered 2015-02-08: 1000 mL via INTRAVENOUS

## 2015-02-08 MED ORDER — NAPROXEN 500 MG PO TABS: 500.0000 mg | ORAL_TABLET | Freq: Two times a day (BID) | ORAL | Status: DC

## 2015-02-08 NOTE — ED Notes (Signed)
MD at bedside. 

## 2015-02-08 NOTE — ED Notes (Signed)
Bladder scan/ post residual void performed: -75ml- / EDP informed

## 2015-02-08 NOTE — ED Notes (Signed)
Pt positioned for comfort.

## 2015-02-08 NOTE — ED Notes (Signed)
Patient is having left flank pain, nausea. History of multiple kidney stones

## 2015-02-08 NOTE — ED Provider Notes (Signed)
CSN: [id number]     Arrival date & time 02/08/15  1509 History  By signing my name below, I, Rayna Sexton, attest that this documentation has been prepared under the direction and in the presence of Ezequiel Essex, MD. Electronically Signed: Rayna Sexton, ED Scribe. 02/08/2015. 3:31 PM.    Chief Complaint  Patient presents with  . Flank Pain    The history is provided by the patient and the spouse. No language interpreter was used.    HPI Comments: Casey Caldwell is a 35 y.o. male who presents to the Emergency Department complaining of worsening, moderate, left flank pain with onset earlier today while watching TV. Pt notes a hx of kidney stones and confirms these symptoms are similar to past occurences. He notes taking Vicodin which he takes for chronic back and groin pain and denies it has provided any relief of his current pain. He confirms his surgical history further noting a SHx including left testicle removal due to epididymitis. He notes having a stimulator in place in his lower back. Pt's urologist is Dr. Rosana Hoes who performed a lithotripsy in 10/2014. He denies dysuria, hematuria, n/v, fevers, chills, leg weakness, acute constipation, testicular pain or other associated symptoms at this time.     Past Medical History  Diagnosis Date  . GERD (gastroesophageal reflux disease)   . History of kidney stones   . Seizures ([place]) Childhood  . Depression     and panic/anxiety  . Insomnia   . Kidney stones    Past Surgical History  Procedure Laterality Date  . Lithotripsy      four times over the years; most recent 10/08/12  . Appendectomy  2011  . Laparoscopic cholecystectomy  1996  . Tonsillectomy  1992  . Wrist ganglion excision  2002    Right  . Hydrocele excision Left 06/2012  . Orchiectomy Left 11/2012  . Spinal cord stimulator insertion  2015   Family History  Problem Relation Age of Onset  . Stroke Mother   . Liver disease Mother   . Irritable bowel syndrome Mother    . Kidney disease Mother   . Heart disease Father   . Colon polyps Father   . Colon cancer Neg Hx    Social History  Substance Use Topics  . Smoking status: Never Smoker   . Smokeless tobacco: Never Used  . Alcohol Use: 0.0 oz/week    0 Standard drinks or equivalent per week     Comment: rarely    Review of Systems A complete 10 system review of systems was obtained and all systems are negative except as noted in the HPI and PMH.   Allergies  Tape; Trileptal; and Wellbutrin  Home Medications   Prior to Admission medications   Medication Sig Start Date End Date Taking? Authorizing Provider  docusate sodium (COLACE) 100 MG capsule Take 1 capsule (100 mg total) by mouth 2 (two) times daily. 09/26/13   Tonia Ghent, MD  doxycycline (VIBRA-TABS) 100 MG tablet Take 1 tablet (100 mg total) by mouth 2 (two) times daily. 01/28/15   Tonia Ghent, MD  escitalopram (LEXAPRO) 20 MG tablet Take 2 tablets (40 mg total) by mouth daily. 01/28/15   Tonia Ghent, MD  HYDROcodone-acetaminophen (NORCO) 10-325 MG tablet Take 1 tablet by mouth every 6 (six) hours as needed. 01/28/15   Tonia Ghent, MD  HYDROmorphone (DILAUDID) 2 MG tablet Take 1 tablet (2 mg total) by mouth every 4 (four) hours as needed  for severe pain (for kidney stones). Sedation caution.  Call if questions. 01/28/15   Tonia Ghent, MD  hydrOXYzine (ATARAX/VISTARIL) 10 MG tablet Take 1 tablet (10 mg total) by mouth 3 (three) times daily as needed for anxiety (sedation caution). Patient not taking: Reported on 01/28/2015 05/29/14   Tonia Ghent, MD  ibuprofen (ADVIL,MOTRIN) 200 MG tablet Take 800 mg by mouth every 6 (six) hours as needed for mild pain or moderate pain (chronic groin & kidney pain).     Historical Provider, MD  indapamide (LOZOL) 2.5 MG tablet Take 2.5 mg by mouth every morning.     Historical Provider, MD  naproxen (NAPROSYN) 500 MG tablet Take 1 tablet (500 mg total) by mouth 2 (two) times daily. 02/08/15   Ezequiel Essex, MD  ondansetron (ZOFRAN ODT) 4 MG disintegrating tablet Take 1 tablet (4 mg total) by mouth every 8 (eight) hours as needed for nausea or vomiting. 12/05/14   Tonia Ghent, MD  ondansetron (ZOFRAN) 4 MG tablet Take 1 tablet (4 mg total) by mouth every 6 (six) hours. 02/08/15   Ezequiel Essex, MD  pantoprazole (PROTONIX) 40 MG tablet TAKE ONE TABLET BY MOUTH TWICE DAILY 12/22/14   Tonia Ghent, MD  polyethylene glycol powder (GLYCOLAX/MIRALAX) powder Take 17 g by mouth 2 (two) times daily as needed. 12/30/13   Tonia Ghent, MD  potassium chloride (K-DUR) 10 MEQ tablet Take 10 mEq by mouth 2 (two) times daily.    Historical Provider, MD  tamsulosin (FLOMAX) 0.4 MG CAPS capsule Take 1 capsule (0.4 mg total) by mouth daily. 07/15/14   April Palumbo, MD  zolpidem (AMBIEN) 10 MG tablet Take 1 tablet (10 mg total) by mouth at bedtime. Fill on/after 12/17/14 12/05/14   Tonia Ghent, MD   BP 137/88 mmHg  Pulse 77  Temp(Src) 97.9 F (36.6 C) (Oral)  Resp 18  SpO2 97%    Physical Exam  Constitutional: He is oriented to person, place, and time. He appears well-developed and well-nourished. No distress.  HENT:  Head: Normocephalic and atraumatic.  Mouth/Throat: Oropharynx is clear and moist. No oropharyngeal exudate.  Eyes: Conjunctivae and EOM are normal. Pupils are equal, round, and reactive to light.  Neck: Normal range of motion. Neck supple.  No meningismus.  Cardiovascular: Normal rate, regular rhythm, normal heart sounds and intact distal pulses.   No murmur heard. Pulmonary/Chest: Effort normal and breath sounds normal. No respiratory distress.  Abdominal: Soft. There is tenderness. There is no rebound and no guarding.  Uncomfortable. Has left CVA pain and left sided abd pain. No guarding or rebound. Left testicle is surgically absent. Right testicle is diffusely tender.   Musculoskeletal: Normal range of motion. He exhibits no edema or tenderness.  Neurological: He is  alert and oriented to person, place, and time. No cranial nerve deficit. He exhibits normal muscle tone. Coordination normal.  No ataxia on finger to nose bilaterally. No pronator drift. 5/5 strength throughout. CN 2-12 intact.Equal grip strength. Sensation intact.   Skin: Skin is warm.  Psychiatric: He has a normal mood and affect. His behavior is normal.  Nursing note and vitals reviewed.   ED Course  Procedures  DIAGNOSTIC STUDIES: Oxygen Saturation is 100% on RA, normal by my interpretation.    COORDINATION OF CARE: 3:30 PM Discussed next steps with pt and he agreed to the plan.   4:55 PM Discussed results of labs and CT with pt. Pt's current pain is 6/10 noting it moved  higher further noting he recently urinated without difficulty. Discussed next steps including an US of the bladder and he agreed to the plan.   Labs Review Labs Reviewed  CBC WITH DIFFERENTIAL/PLATELET - Abnormal; Notable for the following:    Hemoglobin 17.4 (*)    All other components within normal limits  URINALYSIS, ROUTINE W REFLEX MICROSCOPIC (NOT AT [facility])  BASIC METABOLIC PANEL    Imaging Review US Scrotum  02/08/2015  CLINICAL DATA:  Left flank, groin and scrotal pain for 2 days. Left orchiectomy 2 years prior. History of nephrolithiasis. EXAM: SCROTAL ULTRASOUND DOPPLER ULTRASOUND OF THE TESTICLES TECHNIQUE: Complete ultrasound examination of the testicles, epididymis, and other scrotal structures was performed. Color and spectral Doppler ultrasound were also utilized to evaluate blood flow to the testicles. COMPARISON:  07/18/2014 scrotal sonogram. FINDINGS: Right testicle Measurements: 4.8 x 2.6 x 3.5 cm. No mass or microlithiasis visualized. Left testicle Surgically absent. No mass or fluid collection detected in the left orchiectomy bed. Right epididymis:  Normal in size and appearance. Left epididymis:  Surgically absent. Hydrocele:  No significant hydrocele. Varicocele:  None visualized. Pulsed Doppler  interrogation of the right testis demonstrates normal low resistance arterial and venous waveforms in the right testis. IMPRESSION: 1. Status post left orchiectomy. No abnormal findings in the left scrotum. 2. Normal right testis, with no evidence of right testicular torsion. 3. Normal right epididymis, with no evidence of acute right epididymitis. Electronically Signed   By: Ilona Sorrel M.D.   On: 02/08/2015 16:20   [place] Art/ven Flow Abd Pelv Doppler  02/08/2015  CLINICAL DATA:  Left flank, groin and scrotal pain for 2 days. Left orchiectomy 2 years prior. History of nephrolithiasis. EXAM: SCROTAL ULTRASOUND DOPPLER ULTRASOUND OF THE TESTICLES TECHNIQUE: Complete ultrasound examination of the testicles, epididymis, and other scrotal structures was performed. Color and spectral Doppler ultrasound were also utilized to evaluate blood flow to the testicles. COMPARISON:  07/18/2014 scrotal sonogram. FINDINGS: Right testicle Measurements: 4.8 x 2.6 x 3.5 cm. No mass or microlithiasis visualized. Left testicle Surgically absent. No mass or fluid collection detected in the left orchiectomy bed. Right epididymis:  Normal in size and appearance. Left epididymis:  Surgically absent. Hydrocele:  No significant hydrocele. Varicocele:  None visualized. Pulsed Doppler interrogation of the right testis demonstrates normal low resistance arterial and venous waveforms in the right testis. IMPRESSION: 1. Status post left orchiectomy. No abnormal findings in the left scrotum. 2. Normal right testis, with no evidence of right testicular torsion. 3. Normal right epididymis, with no evidence of acute right epididymitis. Electronically Signed   By: Ilona Sorrel M.D.   On: 02/08/2015 16:20   Ct Renal Stone Study  02/08/2015  CLINICAL DATA:  Progressive moderate left flank pain. History of kidney stones. Lithotripsy 3 months ago. EXAM: CT ABDOMEN AND PELVIS WITHOUT CONTRAST TECHNIQUE: Multidetector CT imaging of the abdomen and pelvis  was performed following the standard protocol without IV contrast. COMPARISON:  CT 10/20/2014. FINDINGS: Lower chest: Stable mild scarring or atelectasis within the lingula. The lung bases are otherwise clear. No significant pleural or pericardial effusion. Hepatobiliary: Diffuse heterogeneous hepatic steatosis again noted. No apparent change or acute findings on noncontrast imaging. No significant biliary dilatation status post cholecystectomy. Pancreas: Unremarkable. No pancreatic ductal dilatation or surrounding inflammatory changes. Spleen: Normal in size without focal abnormality. Adrenals/Urinary Tract: Both adrenal glands appear normal. Small bilateral renal calculi (2 on the right and 1 on the left). No evidence of hydronephrosis, perinephric soft tissue stranding or  ureteral calculus. The bladder appears normal. Stomach/Bowel: No evidence of bowel wall thickening, distention or surrounding inflammatory change. Previous appendectomy. Vascular/Lymphatic: There are no enlarged abdominal or pelvic lymph nodes. No significant vascular findings on noncontrast imaging. Reproductive: Unremarkable. Other: Stable small umbilical hernia containing only fat. Musculoskeletal: No acute or significant osseous findings. Thoracic spinal stimulator noted. IMPRESSION: 1. No acute findings. 2. No change in nonobstructing bilateral renal calculi. No evidence of hydronephrosis or ureteral calculus. 3. Heterogeneous hepatic steatosis. 4. Previous cholecystectomy and appendectomy. Electronically Signed   By: Richardean Sale M.D.   On: 02/08/2015 16:17   I have personally reviewed and evaluated these images and lab results as part of my medical decision-making.   EKG Interpretation None      MDM   Final diagnoses:  Testicle pain  Flank pain   Acute and chronic flank pain concern for another kidney stone. Nausea without vomiting. No fever.  Urinalysis is negative. No hematuria. Post were residual shows 0 ML's. No  evidence of urinary retention. Equal strength and sensation and reflexes in lower extremities. Low suspicion for cauda equina or cord compression.  Testicular ultrasound is normal. No evidence of right-sided torsion. No evidence of ureteral stone.  Patient's pain is treated in the ED. He does have significant amount of chronic pain. He has pain medication at home. There is no acute finding on his workup today. He appears stable for outpatient follow-up.  Return Precautions discussed.   I personally performed the services described in this documentation, which was scribed in my presence. The recorded information has been reviewed and is accurate.    Ezequiel Essex, MD 02/08/15 [phone]

## 2015-02-08 NOTE — Discharge Instructions (Signed)
Flank Pain °Flank pain refers to pain that is located on the side of the body between the upper abdomen and the back. The pain may occur over a short period of time (acute) or may be long-term or reoccurring (chronic). It may be mild or severe. Flank pain can be caused by many things. °CAUSES  °Some of the more common causes of flank pain include: °· Muscle strains.   °· Muscle spasms.   °· A disease of your spine (vertebral disk disease).   °· A lung infection (pneumonia).   °· Fluid around your lungs (pulmonary edema).   °· A kidney infection.   °· Kidney stones.   °· A very painful skin rash caused by the chickenpox virus (shingles).   °· Gallbladder disease.   °HOME CARE INSTRUCTIONS  °Home care will depend on the cause of your pain. In general, °· Rest as directed by your caregiver. °· Drink enough fluids to keep your urine clear or pale yellow. °· Only take over-the-counter or prescription medicines as directed by your caregiver. Some medicines may help relieve the pain. °· Tell your caregiver about any changes in your pain. °· Follow up with your caregiver as directed. °SEEK IMMEDIATE MEDICAL CARE IF:  °· Your pain is not controlled with medicine.   °· You have new or worsening symptoms. °· Your pain increases.   °· You have abdominal pain.   °· You have shortness of breath.   °· You have persistent nausea or vomiting.   °· You have swelling in your abdomen.   °· You feel faint or pass out.   °· You have blood in your urine. °· You have a fever or persistent symptoms for more than 2-3 days. °· You have a fever and your symptoms suddenly get worse. °MAKE SURE YOU:  °· Understand these instructions. °· Will watch your condition. °· Will get help right away if you are not doing well or get worse. °  °This information is not intended to replace advice given to you by your health care provider. Make sure you discuss any questions you have with your health care provider. °  °Document Released: 03/03/2005 Document  Revised: 10/05/2011 Document Reviewed: 08/25/2011 °Elsevier Interactive Patient Education ©2016 [employer] ° °

## 2015-02-08 NOTE — ED Notes (Signed)
Patient transported to Ultrasound via stretcher, sr x 2

## 2015-02-08 NOTE — ED Notes (Signed)
Pt presents with left flank pain, onset this am, nausea without vomiting.

## 2015-02-08 NOTE — ED Notes (Signed)
Patient reports that he has a spinal cord stimulator in his right back for pain. The patient is also on pain contract.

## 2015-02-17 ENCOUNTER — Other Ambulatory Visit: Payer: Self-pay | Admitting: Family Medicine

## 2015-02-18 ENCOUNTER — Encounter: Payer: Self-pay | Admitting: Family Medicine

## 2015-02-18 ENCOUNTER — Telehealth: Payer: Self-pay | Admitting: Family Medicine

## 2015-02-18 DIAGNOSIS — M545 Low back pain, unspecified: Secondary | ICD-10-CM

## 2015-02-18 DIAGNOSIS — G8929 Other chronic pain: Secondary | ICD-10-CM

## 2015-02-18 NOTE — Telephone Encounter (Signed)
Please call Dr. Edwina Barth pain clinic.  Patient doesn't have insurance.  He is having constant pain in the lower left back.  Pain wtih walking, standing, sitting.  Still with a shooting pain on inside left leg and constant pain in the groin. I don't know what can be done, but I need Dr. Edwina Barth input.  Patient was having trouble getting through to Dr. Catheryn Bacon.  I thank all involved.

## 2015-02-18 NOTE — Telephone Encounter (Signed)
Noted. Thanks.

## 2015-02-18 NOTE — Telephone Encounter (Signed)
Spoke to [employer] (nurse) at Dr. Edwina Barth office [phone]) and was advised that he is in the OR today, but she will send him the message to call Dr. Damita Dunnings. Junie Panning said that it may be in the morning before he is able to call back.

## 2015-02-22 NOTE — Telephone Encounter (Signed)
I hadn't heard back.  Please see what you can find out tomorrow.  Thanks.

## 2015-02-23 NOTE — Telephone Encounter (Signed)
Dr. Catheryn Bacon has changed practices.  New number is:  [phone]

## 2015-02-23 NOTE — Telephone Encounter (Signed)
Left another message for a return call to Dr. Buckner Malta cell phone.

## 2015-02-23 NOTE — Telephone Encounter (Signed)
Thanks. Will await return call.

## 2015-02-24 ENCOUNTER — Other Ambulatory Visit: Payer: Self-pay | Admitting: Family Medicine

## 2015-02-24 NOTE — Addendum Note (Signed)
Addended by: Tonia Ghent on: 02/24/2015 06:04 AM   Modules accepted: Orders

## 2015-02-24 NOTE — Telephone Encounter (Signed)
Electronic refill request. Pharmacy note says:  REFILLS HAVE BEEN MISPLACED [place]; PLEASE SEND NEW RX.  Please advise. Last Filled:    180 tablet 1 01/28/2015  Please advise.

## 2015-02-24 NOTE — Telephone Encounter (Signed)
Patient advised.

## 2015-02-24 NOTE — Telephone Encounter (Signed)
Talked to Dr. Catheryn Bacon.  He changed clinics but his colleagues at the [facility] clinic should be able to see patient and may be able to help with financing.  I put in the referral.   Please call pt.  Thanks.

## 2015-02-25 NOTE — Telephone Encounter (Signed)
Sent. Thanks.   

## 2015-02-27 ENCOUNTER — Telehealth: Payer: Self-pay | Admitting: Family Medicine

## 2015-02-27 ENCOUNTER — Encounter: Payer: Self-pay | Admitting: Family Medicine

## 2015-02-27 NOTE — Telephone Encounter (Signed)
mychart message pasted below.    Hello. I needed to touch base.     1- I just got a new number [phone]    I know you need it for your records but I know whoever you called needs it to call me.    I have not heard from them.    I feel like I'm hitting the wall hard. Suicide comes up every few hours and I've been talking to all my friends to try and Keep busy. I just feel like I'm in the movie ground hogs day. Emotionally I'm spent, pain is just constant and my body has built up a tolerance. The only relieve I get now is taking 15mg  of hydrocode at one time and some days I'm at 60mg  a day.     I know this isn't your responsibility so I'm sorry if I am bugging you. I just have no options unless you give me a number and I can call    Peace [employer] hair grease     [place]      ---------------- I called pt.  Please note new phone number and update the EMR.  I need his referral to the pain clinic at [facility] expedited.   He is trying to stretch his meds to use the lowest dose possible.  No SI/HI.  He contracts for safety.  I need him seen at pain clinic sooner rather than later.  He knows I am on call this weekend.

## 2015-03-06 ENCOUNTER — Encounter: Payer: Self-pay | Admitting: Family Medicine

## 2015-03-11 ENCOUNTER — Encounter: Payer: Self-pay | Admitting: Family Medicine

## 2015-03-12 ENCOUNTER — Encounter (HOSPITAL_BASED_OUTPATIENT_CLINIC_OR_DEPARTMENT_OTHER): Payer: Self-pay | Admitting: Emergency Medicine

## 2015-03-12 ENCOUNTER — Emergency Department (HOSPITAL_BASED_OUTPATIENT_CLINIC_OR_DEPARTMENT_OTHER): Payer: No Typology Code available for payment source

## 2015-03-12 ENCOUNTER — Emergency Department (HOSPITAL_BASED_OUTPATIENT_CLINIC_OR_DEPARTMENT_OTHER)
Admission: EM | Admit: 2015-03-12 | Discharge: 2015-03-12 | Disposition: A | Discharge status: Home / Self Care | Payer: No Typology Code available for payment source | Attending: Emergency Medicine | Admitting: Emergency Medicine

## 2015-03-12 DIAGNOSIS — R109 Unspecified abdominal pain: Secondary | ICD-10-CM | POA: Insufficient documentation

## 2015-03-12 DIAGNOSIS — F329 Major depressive disorder, single episode, unspecified: Secondary | ICD-10-CM | POA: Insufficient documentation

## 2015-03-12 DIAGNOSIS — G4700 Insomnia, unspecified: Secondary | ICD-10-CM | POA: Insufficient documentation

## 2015-03-12 DIAGNOSIS — K219 Gastro-esophageal reflux disease without esophagitis: Secondary | ICD-10-CM | POA: Insufficient documentation

## 2015-03-12 DIAGNOSIS — Z791 Long term (current) use of non-steroidal anti-inflammatories (NSAID): Secondary | ICD-10-CM | POA: Insufficient documentation

## 2015-03-12 DIAGNOSIS — Z79899 Other long term (current) drug therapy: Secondary | ICD-10-CM | POA: Insufficient documentation

## 2015-03-12 DIAGNOSIS — Z792 Long term (current) use of antibiotics: Secondary | ICD-10-CM | POA: Insufficient documentation

## 2015-03-12 DIAGNOSIS — Z87442 Personal history of urinary calculi: Secondary | ICD-10-CM | POA: Insufficient documentation

## 2015-03-12 DIAGNOSIS — F410 Panic disorder [episodic paroxysmal anxiety] without agoraphobia: Secondary | ICD-10-CM | POA: Insufficient documentation

## 2015-03-12 LAB — CBC WITH DIFFERENTIAL/PLATELET
Basophils Absolute: 0 10*3/uL (ref 0.0–0.1)
Basophils Relative: 0 %
Eosinophils Absolute: 0.2 10*3/uL (ref 0.0–0.7)
Eosinophils Relative: 2 %
HCT: 49 % (ref 39.0–52.0)
Hemoglobin: 16.9 g/dL (ref 13.0–17.0)
Lymphocytes Relative: 29 %
Lymphs Abs: 3.1 10*3/uL (ref 0.7–4.0)
MCH: 29.8 pg (ref 26.0–34.0)
MCHC: 34.5 g/dL (ref 30.0–36.0)
MCV: 86.4 fL (ref 78.0–100.0)
Monocytes Absolute: 1.1 10*3/uL — ABNORMAL HIGH (ref 0.1–1.0)
Monocytes Relative: 10 %
Neutro Abs: 6.2 10*3/uL (ref 1.7–7.7)
Neutrophils Relative %: 59 %
Platelets: 328 10*3/uL (ref 150–400)
RBC: 5.67 MIL/uL (ref 4.22–5.81)
RDW: 13.4 % (ref 11.5–15.5)
WBC: 10.5 10*3/uL (ref 4.0–10.5)

## 2015-03-12 LAB — BASIC METABOLIC PANEL
Anion gap: 10 (ref 5–15)
BUN: 10 mg/dL (ref 6–20)
CO2: 27 mmol/L (ref 22–32)
Calcium: 9.3 mg/dL (ref 8.9–10.3)
Chloride: 103 mmol/L (ref 101–111)
Creatinine, Ser: 0.9 mg/dL (ref 0.61–1.24)
GFR calc Af Amer: >60 mL/min (ref >60)
GFR calc non Af Amer: >60 mL/min (ref >60)
Glucose, Bld: 98 mg/dL (ref 65–99)
Potassium: 4.2 mmol/L (ref 3.5–5.1)
Sodium: 140 mmol/L (ref 135–145)

## 2015-03-12 LAB — URINALYSIS, ROUTINE W REFLEX MICROSCOPIC
Bilirubin Urine: NEGATIVE
Glucose, UA: NEGATIVE mg/dL
Hgb urine dipstick: NEGATIVE
Ketones, ur: NEGATIVE mg/dL
Leukocytes, UA: NEGATIVE
Nitrite: NEGATIVE
Protein, ur: NEGATIVE mg/dL
Specific Gravity, Urine: 1.011 (ref 1.005–1.030)
pH: 5.5 (ref 5.0–8.0)

## 2015-03-12 MED ORDER — ONDANSETRON 4 MG PO TBDP
4.0000 mg | ORAL_TABLET | Freq: Once | ORAL | Status: AC
  Administered 2015-03-12: 4 mg via ORAL
  Filled 2015-03-12: qty 1

## 2015-03-12 MED ORDER — HYDROCODONE-ACETAMINOPHEN 10-325 MG PO TABS: 1.0000 | ORAL_TABLET | Freq: Four times a day (QID) | ORAL | Status: DC | PRN

## 2015-03-12 MED ORDER — ONDANSETRON 4 MG PO TBDP: 4.0000 mg | ORAL_TABLET | Freq: Three times a day (TID) | ORAL | Status: DC | PRN

## 2015-03-12 MED ORDER — ZOLPIDEM TARTRATE 10 MG PO TABS: 10.0000 mg | ORAL_TABLET | Freq: Every day | ORAL | Status: DC

## 2015-03-12 MED ORDER — HYDROMORPHONE HCL 2 MG PO TABS: 2.0000 mg | ORAL_TABLET | ORAL | Status: DC | PRN

## 2015-03-12 MED ORDER — KETOROLAC TROMETHAMINE 30 MG/ML IJ SOLN
30.0000 mg | Freq: Once | INTRAMUSCULAR | Status: AC
  Administered 2015-03-12: 30 mg via INTRAVENOUS
  Filled 2015-03-12: qty 1

## 2015-03-12 MED ORDER — HYDROMORPHONE HCL 2 MG PO TABS
2.0000 mg | ORAL_TABLET | Freq: Once | ORAL | Status: DC
  Filled 2015-03-12: qty 1

## 2015-03-12 MED ORDER — HYDROMORPHONE HCL 1 MG/ML IJ SOLN
2.0000 mg | Freq: Once | INTRAMUSCULAR | Status: AC
  Administered 2015-03-12: 2 mg via INTRAVENOUS
  Filled 2015-03-12: qty 2

## 2015-03-12 MED ORDER — ONDANSETRON HCL 4 MG/2ML IJ SOLN
4.0000 mg | Freq: Once | INTRAMUSCULAR | Status: AC
  Administered 2015-03-12: 4 mg via INTRAVENOUS
  Filled 2015-03-12: qty 2

## 2015-03-12 NOTE — ED Provider Notes (Signed)
CSN: [id number]     Arrival date & time 03/12/15  1638 History   First MD Initiated Contact with Patient 03/12/15 1651     Chief Complaint  Patient presents with  . Flank Pain     (Consider location/radiation/quality/duration/timing/severity/associated sxs/prior Treatment) HPI   35 year old male with history of kidney stones, seizure, depression, GERD presents complaining of right flank pain. Patient reported developing acute onset of right flank pain radiates to his right groin and now to his right testicle since last night. Pain is persistence, nothing seems to make it better or worse. Pain felt similar to prior kidney stones. Pain is now moderate to severe in intensity. Denies having any fever, dysuria, hematuria, penile discharge or rash. Patient report he has has significant history of kidney stones in the past requiring stenting and lithotripsy. He also has a strong pain medication regimen with his primary care provider including Vicodin, and Dilaudid as needed however he ran out of his Dilaudid since yesterday. He denies having any recent injury. Patient mentioned he has passed several small stones this morning which was evidenced on his urine strainer.  Past Medical History  Diagnosis Date  . GERD (gastroesophageal reflux disease)   . History of kidney stones   . Seizures ([place]) Childhood  . Depression     and panic/anxiety  . Insomnia   . Kidney stones    Past Surgical History  Procedure Laterality Date  . Lithotripsy      four times over the years; most recent 10/08/12  . Appendectomy  2011  . Laparoscopic cholecystectomy  1996  . Tonsillectomy  1992  . Wrist ganglion excision  2002    Right  . Hydrocele excision Left 06/2012  . Orchiectomy Left 11/2012  . Spinal cord stimulator insertion  2015   Family History  Problem Relation Age of Onset  . Stroke Mother   . Liver disease Mother   . Irritable bowel syndrome Mother   . Kidney disease Mother   . Heart disease  Father   . Colon polyps Father   . Colon cancer Neg Hx    Social History  Substance Use Topics  . Smoking status: Never Smoker   . Smokeless tobacco: Never Used  . Alcohol Use: 0.0 oz/week    0 Standard drinks or equivalent per week     Comment: rarely    Review of Systems  All other systems reviewed and are negative.     Allergies  Tape; Trileptal; and Wellbutrin  Home Medications   Prior to Admission medications   Medication Sig Start Date End Date Taking? Authorizing Provider  docusate sodium (COLACE) 100 MG capsule Take 1 capsule (100 mg total) by mouth 2 (two) times daily. 09/26/13   Tonia Ghent, MD  doxycycline (VIBRA-TABS) 100 MG tablet Take 1 tablet (100 mg total) by mouth 2 (two) times daily. 01/28/15   Tonia Ghent, MD  escitalopram (LEXAPRO) 20 MG tablet TAKE 2 TABLETS BY MOUTH DAILY 02/25/15   Tonia Ghent, MD  HYDROcodone-acetaminophen [facility]) 10-325 MG tablet Take 1 tablet by mouth every 6 (six) hours as needed. 03/12/15   Tonia Ghent, MD  HYDROmorphone (DILAUDID) 2 MG tablet Take 1 tablet (2 mg total) by mouth every 4 (four) hours as needed for severe pain (for kidney stones). Sedation caution.  Call if questions. 03/12/15   Tonia Ghent, MD  hydrOXYzine (ATARAX/VISTARIL) 10 MG tablet Take 1 tablet (10 mg total) by mouth 3 (three) times daily as  needed for anxiety (sedation caution). Patient not taking: Reported on 01/28/2015 05/29/14   Tonia Ghent, MD  ibuprofen (ADVIL,MOTRIN) 200 MG tablet Take 800 mg by mouth every 6 (six) hours as needed for mild pain or moderate pain (chronic groin & kidney pain).     Historical Provider, MD  indapamide (LOZOL) 2.5 MG tablet Take 2.5 mg by mouth every morning.     Historical Provider, MD  naproxen (NAPROSYN) 500 MG tablet Take 1 tablet (500 mg total) by mouth 2 (two) times daily. 02/08/15   Ezequiel Essex, MD  ondansetron (ZOFRAN ODT) 4 MG disintegrating tablet Take 1 tablet (4 mg total) by mouth every 8 (eight) hours  as needed for nausea or vomiting. 03/12/15   Tonia Ghent, MD  ondansetron (ZOFRAN) 4 MG tablet Take 1 tablet (4 mg total) by mouth every 6 (six) hours. 02/08/15   Ezequiel Essex, MD  pantoprazole (PROTONIX) 40 MG tablet TAKE ONE TABLET BY MOUTH TWICE DAILY 12/22/14   Tonia Ghent, MD  polyethylene glycol powder (GLYCOLAX/MIRALAX) powder MIX 17G IN 4 TO 8 OUNCES OF FLUID AND TAKE TWICE DAILY 02/17/15   Tonia Ghent, MD  potassium chloride (K-DUR) 10 MEQ tablet Take 10 mEq by mouth 2 (two) times daily.    Historical Provider, MD  tamsulosin (FLOMAX) 0.4 MG CAPS capsule Take 1 capsule (0.4 mg total) by mouth daily. 07/15/14   April Palumbo, MD  zolpidem (AMBIEN) 10 MG tablet Take 1 tablet (10 mg total) by mouth at bedtime. Fill on/after 12/17/14 03/12/15   Tonia Ghent, MD   BP 176/116 mmHg  Pulse 82  Temp(Src) 98.3 F (36.8 C) (Oral)  Resp 18  Ht 6' (1.829 m)  Wt 140.615 kg  BMI 42.03 kg/m2  SpO2 97% Physical Exam  Constitutional: He appears well-developed and well-nourished. No distress.  Patient is morbidly obese Caucasian male, leaning over the bed appears to be in some discomfort.  HENT:  Head: Atraumatic.  Eyes: Conjunctivae are normal.  Neck: Neck supple.  Cardiovascular: Normal rate and regular rhythm.   Pulmonary/Chest: Effort normal and breath sounds normal.  Abdominal: Soft. There is no tenderness.  Genitourinary:  Bilateral CVA tenderness right greater than left on percussion. Chaperone present for inspection of the genitalia. Patient has a circumcised penis free of lesion or rash. Right testicle is mildly tender on palpation without any obvious swelling. 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 No inguinal hernia noted  Neurological: He is alert.  Skin: No rash noted.  Psychiatric: He has a normal mood and affect.  Nursing note and vitals reviewed.   ED Course  Procedures (including critical care time) Labs Review Labs Reviewed  CBC WITH  DIFFERENTIAL/PLATELET - Abnormal; Notable for the following:    Monocytes Absolute 1.1 (*)    All other components within normal limits  URINALYSIS, ROUTINE W REFLEX MICROSCOPIC (NOT AT [facility])  BASIC METABOLIC PANEL    Imaging Review US Renal  03/12/2015  CLINICAL DATA:  Lower back pain and groin pain for 20 hours. History of multiple kidney stones EXAM: RENAL / URINARY TRACT ULTRASOUND COMPLETE COMPARISON:  02/08/2015 abdominal CT FINDINGS: Right Kidney: Length: 13.3 cm. Echogenicity within normal limits. No mass or hydronephrosis visualized. Lower pole calculus with sized more accurately assessed on comparison CT. Left Kidney: Length: 13.8 cm. Echogenicity within normal limits. No mass or hydronephrosis visualized. Multiple hilar calculi. Bladder: Collapsed, precluding detection of ureteral jets. IMPRESSION: 1. No hydronephrosis. 2. Bilateral nephrolithiasis. Electronically Signed   By:  Monte Fantasia M.D.   On: 03/12/2015 18:53   I have personally reviewed and evaluated these images and lab results as part of my medical decision-making.   EKG Interpretation None      MDM   Final diagnoses:  Right flank pain    BP 140/90 mmHg  Pulse 82  Temp(Src) 98.3 F (36.8 C) (Oral)  Resp 16  Ht 6' (1.829 m)  Wt 140.615 kg  BMI 42.03 kg/m2  SpO2 98%   5:08 PM Patient with a significant history of kidney stones require stenting lithotripsy in the past here with complaint of right groin, right inguinal and right testicle pain consistence with kidney stones pain. He does have mild tenderness to his right testicle but I have low suspicion for testicular torsion causing his symptoms. Evidence of CVA tenderness on percussion. He is also hypertensive with blood pressure 176/116 likely secondary to pain. He had a renal CT scan performed a month ago demonstrating bilateral nonobstructive kidney stone. My plan is to obtain UA, check renal function, performed the ultrasound, and give Toradol for  pain.  7:14 PM Urine shows no signs of urinary tract infection and no blood in urine. Normal electrolytes panel. No leukocytosis. Normal H&H. Renal ultrasound without concerning finding.  Pt receive pain medication in the ER.  I encourage f/u with PCP or urologist for further care.    Domenic Moras, PA-C 03/12/15 2004  Blanchie Dessert, MD 03/12/15 [phone]

## 2015-03-12 NOTE — Discharge Instructions (Signed)
Please follow up closely with your urologist for further management of your flank pain.  Flank Pain Flank pain refers to pain that is located on the side of the body between the upper abdomen and the back. The pain may occur over a short period of time (acute) or may be long-term or reoccurring (chronic). It may be mild or severe. Flank pain can be caused by many things. CAUSES  Some of the more common causes of flank pain include:  Muscle strains.   Muscle spasms.   A disease of your spine (vertebral disk disease).   A lung infection (pneumonia).   Fluid around your lungs (pulmonary edema).   A kidney infection.   Kidney stones.   A very painful skin rash caused by the chickenpox virus (shingles).   Gallbladder disease.  [place] care will depend on the cause of your pain. In general,  Rest as directed by your caregiver.  Drink enough fluids to keep your urine clear or pale yellow.  Only take over-the-counter or prescription medicines as directed by your caregiver. Some medicines may help relieve the pain.  Tell your caregiver about any changes in your pain.  Follow up with your caregiver as directed. SEEK IMMEDIATE MEDICAL CARE IF:   Your pain is not controlled with medicine.   You have new or worsening symptoms.  Your pain increases.   You have abdominal pain.   You have shortness of breath.   You have persistent nausea or vomiting.   You have swelling in your abdomen.   You feel faint or pass out.   You have blood in your urine.  You have a fever or persistent symptoms for more than 2-3 days.  You have a fever and your symptoms suddenly get worse. MAKE SURE YOU:   Understand these instructions.  Will watch your condition.  Will get help right away if you are not doing well or get worse.   This information is not intended to replace advice given to you by your health care provider. Make sure you discuss any  questions you have with your health care provider.   Document Released: 03/03/2005 Document Revised: 10/05/2011 Document Reviewed: 08/25/2011 Elsevier Interactive Patient Education [employer].

## 2015-03-12 NOTE — Telephone Encounter (Signed)
Patient advised.  Rx left at front desk for pick up. 

## 2015-03-12 NOTE — Telephone Encounter (Signed)
Printed.  Thanks.  

## 2015-03-12 NOTE — ED Notes (Signed)
Patient reports that he is having pain to his right groin and flank area. Feels like this may be another kidney stone

## 2015-03-12 NOTE — ED Notes (Signed)
Pt lying supine texting on cell phone and watching tv in nad.

## 2015-03-13 ENCOUNTER — Encounter: Payer: Self-pay | Admitting: Family Medicine

## 2015-03-13 ENCOUNTER — Telehealth: Payer: Self-pay | Admitting: Family Medicine

## 2015-03-13 NOTE — Telephone Encounter (Signed)
Called pt.  He has f/u with pain clinic pending for next week.  He was asking about possible med change in the meantime, for acute flares of pain.  I think it is likely best to leave his meds as they are and have him f/u with the pain clinic, since it is pending in the near future. He was okay with that and I'll await pain clinic eval.

## 2015-03-16 ENCOUNTER — Telehealth: Payer: Self-pay | Admitting: Family Medicine

## 2015-03-16 ENCOUNTER — Encounter: Payer: Self-pay | Admitting: Family Medicine

## 2015-03-16 NOTE — Telephone Encounter (Signed)
Phoned patient, transferred call to Dr. Damita Dunnings.

## 2015-03-16 NOTE — Telephone Encounter (Signed)
Late entry.  Talked to patient.  He has no plan for suicide.  He contracts for safety.  He has family support, his wife and his father.   He is continuing to have sig pain and fed up with the discomfort.  He has f/u with the pain clinic on Thursday of this week.   I tried to call the pain clinic to talk about his pending appointment but couldn't get through, after 5pm.    [place]- please see if you can get MD/NP from pain clinic on the phone on Tuesday.  [phone].  Thanks.

## 2015-03-16 NOTE — Telephone Encounter (Signed)
I called pt and his wife.  LMOVM for both of them.  Please call them this PM.  Let me know if you can't get up with them.   His mood was worse recently, worse with his inc in pain.  Pull me as needed.  Thanks.

## 2015-03-17 NOTE — Telephone Encounter (Signed)
Left detailed message for a return call to Dr. Buckner Malta cell phone.

## 2015-03-19 NOTE — Telephone Encounter (Signed)
Patient has appt today and Dr. Damita Dunnings will speak with patient's wife today.

## 2015-03-21 ENCOUNTER — Encounter (HOSPITAL_BASED_OUTPATIENT_CLINIC_OR_DEPARTMENT_OTHER): Payer: Self-pay | Admitting: *Deleted

## 2015-03-21 ENCOUNTER — Emergency Department (HOSPITAL_BASED_OUTPATIENT_CLINIC_OR_DEPARTMENT_OTHER)
Admission: EM | Admit: 2015-03-21 | Discharge: 2015-03-21 | Disposition: A | Discharge status: Home / Self Care | Payer: No Typology Code available for payment source | Attending: Emergency Medicine | Admitting: Emergency Medicine

## 2015-03-21 DIAGNOSIS — Z8669 Personal history of other diseases of the nervous system and sense organs: Secondary | ICD-10-CM | POA: Insufficient documentation

## 2015-03-21 DIAGNOSIS — F410 Panic disorder [episodic paroxysmal anxiety] without agoraphobia: Secondary | ICD-10-CM | POA: Insufficient documentation

## 2015-03-21 DIAGNOSIS — Z9889 Other specified postprocedural states: Secondary | ICD-10-CM | POA: Insufficient documentation

## 2015-03-21 DIAGNOSIS — R112 Nausea with vomiting, unspecified: Secondary | ICD-10-CM | POA: Insufficient documentation

## 2015-03-21 DIAGNOSIS — K219 Gastro-esophageal reflux disease without esophagitis: Secondary | ICD-10-CM | POA: Insufficient documentation

## 2015-03-21 DIAGNOSIS — F329 Major depressive disorder, single episode, unspecified: Secondary | ICD-10-CM | POA: Insufficient documentation

## 2015-03-21 DIAGNOSIS — Z87442 Personal history of urinary calculi: Secondary | ICD-10-CM | POA: Insufficient documentation

## 2015-03-21 DIAGNOSIS — R109 Unspecified abdominal pain: Secondary | ICD-10-CM

## 2015-03-21 DIAGNOSIS — Z792 Long term (current) use of antibiotics: Secondary | ICD-10-CM | POA: Insufficient documentation

## 2015-03-21 DIAGNOSIS — Z791 Long term (current) use of non-steroidal anti-inflammatories (NSAID): Secondary | ICD-10-CM | POA: Insufficient documentation

## 2015-03-21 DIAGNOSIS — Z9049 Acquired absence of other specified parts of digestive tract: Secondary | ICD-10-CM | POA: Insufficient documentation

## 2015-03-21 DIAGNOSIS — Z79899 Other long term (current) drug therapy: Secondary | ICD-10-CM | POA: Insufficient documentation

## 2015-03-21 LAB — URINALYSIS, ROUTINE W REFLEX MICROSCOPIC
Glucose, UA: NEGATIVE mg/dL
Ketones, ur: 15 mg/dL — AB
Leukocytes, UA: NEGATIVE
Nitrite: NEGATIVE
Protein, ur: NEGATIVE mg/dL
Specific Gravity, Urine: 1.029 (ref 1.005–1.030)
pH: 5.5 (ref 5.0–8.0)

## 2015-03-21 LAB — CBC WITH DIFFERENTIAL/PLATELET
Basophils Absolute: 0 10*3/uL (ref 0.0–0.1)
Basophils Relative: 0 %
Eosinophils Absolute: 0.2 10*3/uL (ref 0.0–0.7)
Eosinophils Relative: 2 %
HCT: 46.4 % (ref 39.0–52.0)
Hemoglobin: 16.8 g/dL (ref 13.0–17.0)
Lymphocytes Relative: 21 %
Lymphs Abs: 2.3 10*3/uL (ref 0.7–4.0)
MCH: 30.9 pg (ref 26.0–34.0)
MCHC: 36.2 g/dL — ABNORMAL HIGH (ref 30.0–36.0)
MCV: 85.3 fL (ref 78.0–100.0)
Monocytes Absolute: 1 10*3/uL (ref 0.1–1.0)
Monocytes Relative: 9 %
Neutro Abs: 7.3 10*3/uL (ref 1.7–7.7)
Neutrophils Relative %: 68 %
Platelets: 381 10*3/uL (ref 150–400)
RBC: 5.44 MIL/uL (ref 4.22–5.81)
RDW: 13.3 % (ref 11.5–15.5)
WBC: 10.8 10*3/uL — ABNORMAL HIGH (ref 4.0–10.5)

## 2015-03-21 LAB — BASIC METABOLIC PANEL
Anion gap: 12 (ref 5–15)
BUN: 12 mg/dL (ref 6–20)
CO2: 23 mmol/L (ref 22–32)
Calcium: 10.1 mg/dL (ref 8.9–10.3)
Chloride: 105 mmol/L (ref 101–111)
Creatinine, Ser: 1.04 mg/dL (ref 0.61–1.24)
GFR calc Af Amer: >60 mL/min (ref >60)
GFR calc non Af Amer: >60 mL/min (ref >60)
Glucose, Bld: 139 mg/dL — ABNORMAL HIGH (ref 65–99)
Potassium: 3.6 mmol/L (ref 3.5–5.1)
Sodium: 140 mmol/L (ref 135–145)

## 2015-03-21 LAB — URINE MICROSCOPIC-ADD ON

## 2015-03-21 MED ORDER — ONDANSETRON HCL 4 MG/2ML IJ SOLN
4.0000 mg | Freq: Once | INTRAMUSCULAR | Status: AC
  Administered 2015-03-21: 4 mg via INTRAVENOUS
  Filled 2015-03-21: qty 2

## 2015-03-21 MED ORDER — ONDANSETRON HCL 4 MG PO TABS: 4.0000 mg | ORAL_TABLET | Freq: Four times a day (QID) | ORAL | Status: DC

## 2015-03-21 MED ORDER — KETOROLAC TROMETHAMINE 30 MG/ML IJ SOLN
30.0000 mg | Freq: Once | INTRAMUSCULAR | Status: AC
  Administered 2015-03-21: 30 mg via INTRAVENOUS
  Filled 2015-03-21: qty 1

## 2015-03-21 MED ORDER — SODIUM CHLORIDE 0.9 % IV BOLUS (SEPSIS)
1000.0000 mL | Freq: Once | INTRAVENOUS | Status: AC
  Administered 2015-03-21: 1000 mL via INTRAVENOUS

## 2015-03-21 MED ORDER — HYDROMORPHONE HCL 1 MG/ML IJ SOLN
2.0000 mg | Freq: Once | INTRAMUSCULAR | Status: AC
  Administered 2015-03-21: 2 mg via INTRAVENOUS
  Filled 2015-03-21: qty 2

## 2015-03-21 NOTE — ED Notes (Signed)
Rt flank pain onset last pm, w n/v  Back and groin pain     Took Vicodin and dilaudid without relief

## 2015-03-21 NOTE — ED Notes (Signed)
C/o rt flank pain radiating into groin and back onset last pm w n/v,  No relief w Vicodin and dilaudid

## 2015-03-21 NOTE — ED Provider Notes (Signed)
CSN: [id number]     Arrival date & time 03/21/15  0458 History   First MD Initiated Contact with Patient 03/21/15 [phone]     Chief Complaint  Patient presents with  . 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 It is noted that on the 20th of this month he was dispensed 30 tablets of hydromorphone 2mg  and 120 tablets of 10 milligrams hydrocodone/325 milligrams APAP. He has a history of multiple visits to the ED for flank pain, 6 in the last 6 months. He has had at least 1 dozen abdominal/pelvic CT scans since 2012 as well as several dozen renal ultrasounds. These studies consistently show nephrolithiasis but a few have shown evidence of ureterolithiasis or obstructive uropathy.  He is here with sharp right flank pain that began yesterday evening about 11:30 PM. The pain is severe and has not been relieved by his home hydromorphone and hydrocodone tablets. It radiates to his right groin. It has been accompanied by nausea and vomiting. He cannot find a comfortable position.  Past Medical History  Diagnosis Date  . GERD (gastroesophageal reflux disease)   . History of kidney stones   . Seizures ([place]) Childhood  . Depression     and panic/anxiety  . Insomnia   . Kidney stones    Past Surgical History  Procedure Laterality Date  . Lithotripsy      four times over the years; most recent 10/08/12  . Appendectomy  2011  . Laparoscopic cholecystectomy  1996  . Tonsillectomy  1992  . Wrist ganglion excision  2002    Right  . Hydrocele excision Left 06/2012  . Orchiectomy Left 11/2012  . Spinal cord stimulator insertion  2015   Family History  Problem Relation Age of Onset  . Stroke Mother   . Liver disease Mother   . Irritable bowel syndrome  Mother   . Kidney disease Mother   . Heart disease Father   . Colon polyps Father   . Colon cancer Neg Hx    Social History  Substance Use Topics  . Smoking status: Never Smoker   . Smokeless tobacco: Never Used  . Alcohol Use: 0.0 oz/week    0 Standard drinks or equivalent per week     Comment: rarely    Review of Systems  All other systems reviewed and are negative.   Allergies  Tape; Trileptal; and Wellbutrin  Home Medications   Prior to Admission medications   Medication Sig Start Date End Date Taking? Authorizing Provider  docusate sodium (COLACE) 100 MG capsule Take 1 capsule (100 mg total) by mouth 2 (two) times daily. 09/26/13   Tonia Ghent, MD  doxycycline (VIBRA-TABS) 100 MG tablet Take 1 tablet (100 mg total) by mouth 2 (two) times daily. 01/28/15   Tonia Ghent, MD  escitalopram (LEXAPRO) 20 MG tablet TAKE 2 TABLETS BY MOUTH DAILY 02/25/15   Tonia Ghent, MD  HYDROcodone-acetaminophen [facility]) 10-325 MG tablet Take 1 tablet by mouth every 6 (six) hours as needed. 03/12/15   Tonia Ghent, MD  HYDROmorphone (DILAUDID) 2 MG tablet Take 1 tablet (2 mg total) by mouth every 4 (four) hours as needed for severe pain (for kidney stones). Sedation caution.  Call if questions. 03/12/15   Tonia Ghent, MD  hydrOXYzine (ATARAX/VISTARIL) 10 MG tablet  Take 1 tablet (10 mg total) by mouth 3 (three) times daily as needed for anxiety (sedation caution). Patient not taking: Reported on 01/28/2015 05/29/14   Tonia Ghent, MD  ibuprofen (ADVIL,MOTRIN) 200 MG tablet Take 800 mg by mouth every 6 (six) hours as needed for mild pain or moderate pain (chronic groin & kidney pain).     Historical Provider, MD  indapamide (LOZOL) 2.5 MG tablet Take 2.5 mg by mouth every morning.     Historical Provider, MD  naproxen (NAPROSYN) 500 MG tablet Take 1 tablet (500 mg total) by mouth 2 (two) times daily. 02/08/15   Ezequiel Essex, MD  ondansetron (ZOFRAN ODT) 4 MG disintegrating tablet Take 1  tablet (4 mg total) by mouth every 8 (eight) hours as needed for nausea or vomiting. 03/12/15   Tonia Ghent, MD  ondansetron (ZOFRAN) 4 MG tablet Take 1 tablet (4 mg total) by mouth every 6 (six) hours. 02/08/15   Ezequiel Essex, MD  pantoprazole (PROTONIX) 40 MG tablet TAKE ONE TABLET BY MOUTH TWICE DAILY 12/22/14   Tonia Ghent, MD  polyethylene glycol powder (GLYCOLAX/MIRALAX) powder MIX 17G IN 4 TO 8 OUNCES OF FLUID AND TAKE TWICE DAILY 02/17/15   Tonia Ghent, MD  potassium chloride (K-DUR) 10 MEQ tablet Take 10 mEq by mouth 2 (two) times daily.    Historical Provider, MD  tamsulosin (FLOMAX) 0.4 MG CAPS capsule Take 1 capsule (0.4 mg total) by mouth daily. 07/15/14   April Palumbo, MD  zolpidem (AMBIEN) 10 MG tablet Take 1 tablet (10 mg total) by mouth at bedtime. Fill on/after 12/17/14 03/12/15   Tonia Ghent, MD   BP 132/82 mmHg  Pulse 84  Temp(Src) 97.8 F (36.6 C) (Oral)  Resp 26  Ht 6' (1.829 m)  Wt 305 lb (138.347 kg)  BMI 41.36 kg/m2  SpO2 99%   Physical Exam  General: Well-developed, obese male in no acute distress; appearance consistent with age of record HENT: normocephalic; atraumatic Eyes: pupils equal, round and reactive to light; extraocular muscles intact Neck: supple Heart: regular rate and rhythm Lungs: clear to auscultation bilaterally Abdomen: soft; obese; right lower quadrant tenderness; bowel sounds present; retching, emesis bag empty GU: Right CVA tenderness Extremities: No deformity; full range of motion; pulses normal Neurologic: Awake, alert and oriented; motor function intact in all extremities and symmetric; no facial droop Skin: Warm and dry Psychiatric: Agitated    ED Course  Procedures (including critical care time)   MDM  Nursing notes and vitals signs, including pulse oximetry, reviewed.  Summary of this visit's results, reviewed by myself:  Labs:  Results for orders placed or performed during the hospital encounter of  03/21/15 (from the past 24 hour(s))  Basic metabolic panel     Status: Abnormal   Collection Time: 03/21/15  5:27 AM  Result Value Ref Range   Sodium 140 135 - 145 mmol/L   Potassium 3.6 3.5 - 5.1 mmol/L   Chloride 105 101 - 111 mmol/L   CO2 23 22 - 32 mmol/L   Glucose, Bld 139 (H) 65 - 99 mg/dL   BUN 12 6 - 20 mg/dL   Creatinine, Ser 1.04 0.61 - 1.24 mg/dL   Calcium 10.1 8.9 - 10.3 mg/dL   GFR calc non Af Amer >60 >60 mL/min   GFR calc Af Amer >60 >60 mL/min   Anion gap 12 5 - 15  CBC with Differential/Platelet     Status: Abnormal   Collection Time: 03/21/15  5:27 AM  Result Value Ref Range   WBC 10.8 (H) 4.0 - 10.5 K/uL   RBC 5.44 4.22 - 5.81 MIL/uL   Hemoglobin 16.8 13.0 - 17.0 g/dL   HCT 46.4 39.0 - 52.0 %   MCV 85.3 78.0 - 100.0 fL   MCH 30.9 26.0 - 34.0 pg   MCHC 36.2 (H) 30.0 - 36.0 g/dL   RDW 13.3 11.5 - 15.5 %   Platelets 381 150 - 400 K/uL   Neutrophils Relative % 68 %   Neutro Abs 7.3 1.7 - 7.7 K/uL   Lymphocytes Relative 21 %   Lymphs Abs 2.3 0.7 - 4.0 K/uL   Monocytes Relative 9 %   Monocytes Absolute 1.0 0.1 - 1.0 K/uL   Eosinophils Relative 2 %   Eosinophils Absolute 0.2 0.0 - 0.7 K/uL   Basophils Relative 0 %   Basophils Absolute 0.0 0.0 - 0.1 K/uL  Urinalysis, Routine w reflex microscopic (not at [facility])     Status: Abnormal   Collection Time: 03/21/15  6:15 AM  Result Value Ref Range   Color, Urine AMBER (A) YELLOW   APPearance CLOUDY (A) CLEAR   Specific Gravity, Urine 1.029 1.005 - 1.030   pH 5.5 5.0 - 8.0   Glucose, UA NEGATIVE NEGATIVE mg/dL   Hgb urine dipstick LARGE (A) NEGATIVE   Bilirubin Urine SMALL (A) NEGATIVE   Ketones, ur 15 (A) NEGATIVE mg/dL   Protein, ur NEGATIVE NEGATIVE mg/dL   Nitrite NEGATIVE NEGATIVE   Leukocytes, UA NEGATIVE NEGATIVE  Urine microscopic-add on     Status: Abnormal   Collection Time: 03/21/15  6:15 AM  Result Value Ref Range   Squamous Epithelial / LPF 0-5 (A) NONE SEEN   WBC, UA 0-5 0 - 5 WBC/hpf   RBC /  HPF 6-30 0 - 5 RBC/hpf   Bacteria, UA FEW (A) NONE SEEN   Crystals CA OXALATE CRYSTALS (A) NEGATIVE   Urine-Other MUCOUS PRESENT    6:52 AM Pain and nausea improved after IV meds. As he is a chronic pain patient he understands he cannot be given a prescription for pain medication but we will give him a dose of IV Dilaudid prior to discharge. He has a urologist with whom he can follow-up.    Shanon Rosser, MD 03/21/15 [phone]

## 2015-03-21 NOTE — Discharge Instructions (Signed)
Flank Pain °Flank pain refers to pain that is located on the side of the body between the upper abdomen and the back. The pain may occur over a short period of time (acute) or may be long-term or reoccurring (chronic). It may be mild or severe. Flank pain can be caused by many things. °CAUSES  °Some of the more common causes of flank pain include: °· Muscle strains.   °· Muscle spasms.   °· A disease of your spine (vertebral disk disease).   °· A lung infection (pneumonia).   °· Fluid around your lungs (pulmonary edema).   °· A kidney infection.   °· Kidney stones.   °· A very painful skin rash caused by the chickenpox virus (shingles).   °· Gallbladder disease.   °HOME CARE INSTRUCTIONS  °Home care will depend on the cause of your pain. In general, °· Rest as directed by your caregiver. °· Drink enough fluids to keep your urine clear or pale yellow. °· Only take over-the-counter or prescription medicines as directed by your caregiver. Some medicines may help relieve the pain. °· Tell your caregiver about any changes in your pain. °· Follow up with your caregiver as directed. °SEEK IMMEDIATE MEDICAL CARE IF:  °· Your pain is not controlled with medicine.   °· You have new or worsening symptoms. °· Your pain increases.   °· You have abdominal pain.   °· You have shortness of breath.   °· You have persistent nausea or vomiting.   °· You have swelling in your abdomen.   °· You feel faint or pass out.   °· You have blood in your urine. °· You have a fever or persistent symptoms for more than 2-3 days. °· You have a fever and your symptoms suddenly get worse. °MAKE SURE YOU:  °· Understand these instructions. °· Will watch your condition. °· Will get help right away if you are not doing well or get worse. °  °This information is not intended to replace advice given to you by your health care provider. Make sure you discuss any questions you have with your health care provider. °  °Document Released: 03/03/2005 Document  Revised: 10/05/2011 Document Reviewed: 08/25/2011 °Elsevier Interactive Patient Education ©2016 [employer] ° °

## 2015-03-22 ENCOUNTER — Telehealth: Payer: Self-pay | Admitting: Family Medicine

## 2015-03-22 NOTE — Telephone Encounter (Signed)
See my chart message

## 2015-03-26 ENCOUNTER — Encounter: Payer: Self-pay | Admitting: Family Medicine

## 2015-04-02 ENCOUNTER — Emergency Department (HOSPITAL_BASED_OUTPATIENT_CLINIC_OR_DEPARTMENT_OTHER): Payer: No Typology Code available for payment source

## 2015-04-02 ENCOUNTER — Encounter (HOSPITAL_BASED_OUTPATIENT_CLINIC_OR_DEPARTMENT_OTHER): Payer: Self-pay | Admitting: *Deleted

## 2015-04-02 ENCOUNTER — Emergency Department (HOSPITAL_BASED_OUTPATIENT_CLINIC_OR_DEPARTMENT_OTHER)
Admission: EM | Admit: 2015-04-02 | Discharge: 2015-04-02 | Disposition: A | Discharge status: Home / Self Care | Payer: No Typology Code available for payment source | Attending: Emergency Medicine | Admitting: Emergency Medicine

## 2015-04-02 DIAGNOSIS — F410 Panic disorder [episodic paroxysmal anxiety] without agoraphobia: Secondary | ICD-10-CM | POA: Insufficient documentation

## 2015-04-02 DIAGNOSIS — Z79899 Other long term (current) drug therapy: Secondary | ICD-10-CM | POA: Insufficient documentation

## 2015-04-02 DIAGNOSIS — G8929 Other chronic pain: Secondary | ICD-10-CM | POA: Insufficient documentation

## 2015-04-02 DIAGNOSIS — Z9079 Acquired absence of other genital organ(s): Secondary | ICD-10-CM | POA: Insufficient documentation

## 2015-04-02 DIAGNOSIS — G4700 Insomnia, unspecified: Secondary | ICD-10-CM | POA: Insufficient documentation

## 2015-04-02 DIAGNOSIS — N201 Calculus of ureter: Secondary | ICD-10-CM | POA: Insufficient documentation

## 2015-04-02 DIAGNOSIS — F329 Major depressive disorder, single episode, unspecified: Secondary | ICD-10-CM | POA: Insufficient documentation

## 2015-04-02 DIAGNOSIS — K219 Gastro-esophageal reflux disease without esophagitis: Secondary | ICD-10-CM | POA: Insufficient documentation

## 2015-04-02 DIAGNOSIS — Z791 Long term (current) use of non-steroidal anti-inflammatories (NSAID): Secondary | ICD-10-CM | POA: Insufficient documentation

## 2015-04-02 DIAGNOSIS — Z792 Long term (current) use of antibiotics: Secondary | ICD-10-CM | POA: Insufficient documentation

## 2015-04-02 LAB — CBC WITH DIFFERENTIAL/PLATELET
Basophils Absolute: 0 10*3/uL (ref 0.0–0.1)
Basophils Relative: 0 %
Eosinophils Absolute: 0.1 10*3/uL (ref 0.0–0.7)
Eosinophils Relative: 1 %
HCT: 43.6 % (ref 39.0–52.0)
Hemoglobin: 15.1 g/dL (ref 13.0–17.0)
Lymphocytes Relative: 19 %
Lymphs Abs: 1.8 10*3/uL (ref 0.7–4.0)
MCH: 30.4 pg (ref 26.0–34.0)
MCHC: 34.6 g/dL (ref 30.0–36.0)
MCV: 87.9 fL (ref 78.0–100.0)
Monocytes Absolute: 0.9 10*3/uL (ref 0.1–1.0)
Monocytes Relative: 10 %
Neutro Abs: 6.8 10*3/uL (ref 1.7–7.7)
Neutrophils Relative %: 70 %
Platelets: 312 10*3/uL (ref 150–400)
RBC: 4.96 MIL/uL (ref 4.22–5.81)
RDW: 13.7 % (ref 11.5–15.5)
WBC: 9.6 10*3/uL (ref 4.0–10.5)

## 2015-04-02 LAB — URINALYSIS, ROUTINE W REFLEX MICROSCOPIC
Bilirubin Urine: NEGATIVE
Glucose, UA: NEGATIVE mg/dL
Ketones, ur: NEGATIVE mg/dL
Leukocytes, UA: NEGATIVE
Nitrite: NEGATIVE
Protein, ur: 30 mg/dL — AB
Specific Gravity, Urine: 1.025 (ref 1.005–1.030)
pH: 6 (ref 5.0–8.0)

## 2015-04-02 LAB — URINE MICROSCOPIC-ADD ON

## 2015-04-02 LAB — BASIC METABOLIC PANEL
Anion gap: 7 (ref 5–15)
BUN: 17 mg/dL (ref 6–20)
CO2: 27 mmol/L (ref 22–32)
Calcium: 9 mg/dL (ref 8.9–10.3)
Chloride: 108 mmol/L (ref 101–111)
Creatinine, Ser: 0.96 mg/dL (ref 0.61–1.24)
GFR calc Af Amer: >60 mL/min (ref >60)
GFR calc non Af Amer: >60 mL/min (ref >60)
Glucose, Bld: 101 mg/dL — ABNORMAL HIGH (ref 65–99)
Potassium: 4 mmol/L (ref 3.5–5.1)
Sodium: 142 mmol/L (ref 135–145)

## 2015-04-02 MED ORDER — HYDROMORPHONE HCL 1 MG/ML IJ SOLN
1.0000 mg | Freq: Once | INTRAMUSCULAR | Status: AC
  Administered 2015-04-02: 1 mg via INTRAVENOUS
  Filled 2015-04-02: qty 1

## 2015-04-02 MED ORDER — KETOROLAC TROMETHAMINE 30 MG/ML IJ SOLN
30.0000 mg | Freq: Once | INTRAMUSCULAR | Status: AC
  Administered 2015-04-02: 30 mg via INTRAVENOUS
  Filled 2015-04-02: qty 1

## 2015-04-02 MED ORDER — NAPROXEN 500 MG PO TABS: 500.0000 mg | ORAL_TABLET | Freq: Two times a day (BID) | ORAL | Status: DC

## 2015-04-02 MED ORDER — SODIUM CHLORIDE 0.9 % IV BOLUS (SEPSIS)
1000.0000 mL | Freq: Once | INTRAVENOUS | Status: AC
  Administered 2015-04-02: 1000 mL via INTRAVENOUS

## 2015-04-02 MED ORDER — ONDANSETRON HCL 4 MG/2ML IJ SOLN
4.0000 mg | Freq: Once | INTRAMUSCULAR | Status: AC
  Administered 2015-04-02: 4 mg via INTRAVENOUS
  Filled 2015-04-02: qty 2

## 2015-04-02 MED ORDER — SODIUM CHLORIDE 0.9 % IV SOLN
INTRAVENOUS | Status: DC
  Administered 2015-04-02: 09:00:00 via INTRAVENOUS

## 2015-04-02 MED ORDER — ONDANSETRON 4 MG PO TBDP: 4.0000 mg | ORAL_TABLET | Freq: Three times a day (TID) | ORAL | Status: DC | PRN

## 2015-04-02 MED FILL — ONDANSETRON ODT 4 MG TABLET: 4 | 4 days supply | Qty: 12 | Fill #0

## 2015-04-02 MED FILL — NAPROXEN 500 MG TABLET: 500 | 7 days supply | Qty: 14 | Fill #0

## 2015-04-02 NOTE — ED Provider Notes (Addendum)
CSN: [id number]     Arrival date & time 04/02/15  0715 History   First MD Initiated Contact with Patient 04/02/15 [phone]     No chief complaint on file.    (Consider location/radiation/quality/duration/timing/severity/associated sxs/prior Treatment) The history is provided by the patient.  35 year old male with history of chronic back pain. Also history of chronic kidney stone problems. Patient followed by pain management. Patient has stimulator in his back. Patient frequently gets flank pain radiates into his groin and penis area. 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 Patients on chronic narcotic medication.  Patient states that he's got acute onset of right flank pain radiating to the penis. Which is different than his other pain. Does have a urologist Dr. Rosana Hoes. Patient is concerned that he has kidney stones. Pain is 10 out of 10.  Past Medical History  Diagnosis Date  . GERD (gastroesophageal reflux disease)   . History of kidney stones   . Seizures ([place]) Childhood  . Depression     and panic/anxiety  . Insomnia   . Kidney stones    Past Surgical History  Procedure Laterality Date  . Lithotripsy      four times over the years; most recent 10/08/12  . Appendectomy  2011  . Laparoscopic cholecystectomy  1996  . Tonsillectomy  1992  . Wrist ganglion excision  2002    Right  . Hydrocele excision Left 06/2012  . Orchiectomy Left 11/2012  . Spinal cord stimulator insertion  2015   Family History  Problem Relation Age of Onset  . Stroke Mother   . Liver disease Mother   . Irritable bowel syndrome Mother   . Kidney disease Mother   . Heart disease Father   . Colon polyps Father   . Colon cancer Neg Hx    Social History  Substance Use Topics  . Smoking status: Never Smoker   . Smokeless tobacco: Never Used  . Alcohol Use: 0.0 oz/week    0 Standard drinks or equivalent per week     Comment: rarely    Review of  Systems  Constitutional: Negative for fever.  HENT: Negative for congestion.   Eyes: Negative for visual disturbance.  Respiratory: Negative for shortness of breath.   Cardiovascular: Negative for chest pain.  Gastrointestinal: Positive for nausea and abdominal pain.  Genitourinary: Positive for flank pain, difficulty urinating and penile pain. Negative for scrotal swelling and testicular pain.  Musculoskeletal: Positive for back pain. Negative for neck pain.  Skin: Negative for rash.  Neurological: Negative for headaches.  Hematological: Does not bruise/bleed easily.  Psychiatric/Behavioral: Negative for confusion.      Allergies  Tape; Trileptal; and Wellbutrin  Home Medications   Prior to Admission medications   Medication Sig Start Date End Date Taking? Authorizing Provider  docusate sodium (COLACE) 100 MG capsule Take 1 capsule (100 mg total) by mouth 2 (two) times daily. 09/26/13  Yes Tonia Ghent, MD  doxycycline (VIBRA-TABS) 100 MG tablet Take 1 tablet (100 mg total) by mouth 2 (two) times daily. 01/28/15  Yes Tonia Ghent, MD  escitalopram (LEXAPRO) 20 MG tablet TAKE 2 TABLETS BY MOUTH DAILY 02/25/15  Yes Tonia Ghent, MD  HYDROcodone-acetaminophen [facility]) 10-325 MG tablet Take 1 tablet by mouth every 6 (six) hours as needed. 03/12/15  Yes Tonia Ghent, MD  HYDROmorphone (DILAUDID) 2 MG tablet Take 1 tablet (2 mg total) by mouth every 4 (four) hours as  needed for severe pain (for kidney stones). Sedation caution.  Call if questions. 03/12/15  Yes Tonia Ghent, MD  ibuprofen (ADVIL,MOTRIN) 200 MG tablet Take 800 mg by mouth every 6 (six) hours as needed for mild pain or moderate pain (chronic groin & kidney pain).    Yes Historical Provider, MD  indapamide (LOZOL) 2.5 MG tablet Take 2.5 mg by mouth every morning.    Yes Historical Provider, MD  naproxen (NAPROSYN) 500 MG tablet Take 1 tablet (500 mg total) by mouth 2 (two) times daily. 02/08/15  Yes Ezequiel Essex, MD   ondansetron (ZOFRAN ODT) 4 MG disintegrating tablet Take 1 tablet (4 mg total) by mouth every 8 (eight) hours as needed for nausea or vomiting. 03/12/15  Yes Tonia Ghent, MD  ondansetron (ZOFRAN) 4 MG tablet Take 1 tablet (4 mg total) by mouth every 6 (six) hours. 03/21/15  Yes Malvin Johns, MD  pantoprazole (PROTONIX) 40 MG tablet TAKE ONE TABLET BY MOUTH TWICE DAILY 12/22/14  Yes Tonia Ghent, MD  polyethylene glycol powder (GLYCOLAX/MIRALAX) powder MIX 17G IN 4 TO 8 OUNCES OF FLUID AND TAKE TWICE DAILY 02/17/15  Yes Tonia Ghent, MD  potassium chloride (K-DUR) 10 MEQ tablet Take 10 mEq by mouth 2 (two) times daily.   Yes Historical Provider, MD  tamsulosin (FLOMAX) 0.4 MG CAPS capsule Take 1 capsule (0.4 mg total) by mouth daily. 07/15/14  Yes April Palumbo, MD  zolpidem (AMBIEN) 10 MG tablet Take 1 tablet (10 mg total) by mouth at bedtime. Fill on/after 12/17/14 03/12/15  Yes Tonia Ghent, MD  naproxen (NAPROSYN) 500 MG tablet Take 1 tablet (500 mg total) by mouth 2 (two) times daily. 04/02/15   Fredia Sorrow, MD  ondansetron (ZOFRAN ODT) 4 MG disintegrating tablet Take 1 tablet (4 mg total) by mouth every 8 (eight) hours as needed for nausea or vomiting. 04/02/15   Fredia Sorrow, MD   BP 171/117 mmHg  Pulse 94  Temp(Src) 97.7 F (36.5 C) (Oral)  Resp 20  Ht 6' (1.829 m)  Wt 138.347 kg  BMI 41.36 kg/m2  SpO2 98% Physical Exam  Constitutional: He is oriented to person, place, and time. He appears well-developed and well-nourished. He appears distressed.  HENT:  Head: Normocephalic and atraumatic.  Mouth/Throat: Oropharynx is clear and moist.  Eyes: Conjunctivae and EOM are normal. Pupils are equal, round, and reactive to light.  Neck: Normal range of motion. Neck supple.  Cardiovascular: Normal rate, regular rhythm and normal heart sounds.   Pulmonary/Chest: Effort normal and breath sounds normal. No respiratory distress.  Abdominal: Soft. Bowel sounds are normal. There is  no tenderness.  Genitourinary: Penis normal.  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 Right testicle non tender. No skin lesions no palpable hernia. No erythema. No discharge.  Musculoskeletal: Normal range of motion.  Neurological: He is alert and oriented to person, place, and time. No cranial nerve deficit. He exhibits normal muscle tone. Coordination normal.  Skin: Skin is warm. No erythema.  Nursing note and vitals reviewed.   ED Course  Procedures (including critical care time) Labs Review Labs Reviewed  URINALYSIS, ROUTINE W REFLEX MICROSCOPIC (NOT AT [facility]) - Abnormal; Notable for the following:    APPearance HAZY (*)    Hgb urine dipstick LARGE (*)    Protein, ur 30 (*)    All other components within normal limits  BASIC METABOLIC PANEL - Abnormal; Notable for the following:    Glucose, Bld 101 (*)    All other  components within normal limits  URINE MICROSCOPIC-ADD ON - Abnormal; Notable for the following:    Squamous Epithelial / LPF 0-5 (*)    Bacteria, UA MANY (*)    All other components within normal limits  CBC WITH DIFFERENTIAL/PLATELET  CBC WITH DIFFERENTIAL/PLATELET   Results for orders placed or performed during the hospital encounter of 04/02/15  Urinalysis, Routine w reflex microscopic (not at [facility])  Result Value Ref Range   Color, Urine YELLOW YELLOW   APPearance HAZY (A) CLEAR   Specific Gravity, Urine 1.025 1.005 - 1.030   pH 6.0 5.0 - 8.0   Glucose, UA NEGATIVE NEGATIVE mg/dL   Hgb urine dipstick LARGE (A) NEGATIVE   Bilirubin Urine NEGATIVE NEGATIVE   Ketones, ur NEGATIVE NEGATIVE mg/dL   Protein, ur 30 (A) NEGATIVE mg/dL   Nitrite NEGATIVE NEGATIVE   Leukocytes, UA NEGATIVE NEGATIVE  CBC with Differential  Result Value Ref Range   WBC 9.6 4.0 - 10.5 K/uL   RBC 4.96 4.22 - 5.81 MIL/uL   Hemoglobin 15.1 13.0 - 17.0 g/dL   HCT 43.6 39.0 - 52.0 %   MCV 87.9 78.0 - 100.0 fL   MCH 30.4 26.0 - 34.0 pg   MCHC 34.6 30.0 - 36.0 g/dL   RDW 13.7 11.5 - 15.5 %    Platelets 312 150 - 400 K/uL   Neutrophils Relative % 70 %   Neutro Abs 6.8 1.7 - 7.7 K/uL   Lymphocytes Relative 19 %   Lymphs Abs 1.8 0.7 - 4.0 K/uL   Monocytes Relative 10 %   Monocytes Absolute 0.9 0.1 - 1.0 K/uL   Eosinophils Relative 1 %   Eosinophils Absolute 0.1 0.0 - 0.7 K/uL   Basophils Relative 0 %   Basophils Absolute 0.0 0.0 - 0.1 K/uL  Basic metabolic panel  Result Value Ref Range   Sodium 142 135 - 145 mmol/L   Potassium 4.0 3.5 - 5.1 mmol/L   Chloride 108 101 - 111 mmol/L   CO2 27 22 - 32 mmol/L   Glucose, Bld 101 (H) 65 - 99 mg/dL   BUN 17 6 - 20 mg/dL   Creatinine, Ser 0.96 0.61 - 1.24 mg/dL   Calcium 9.0 8.9 - 10.3 mg/dL   GFR calc non Af Amer >60 >60 mL/min   GFR calc Af Amer >60 >60 mL/min   Anion gap 7 5 - 15  Urine microscopic-add on  Result Value Ref Range   Squamous Epithelial / LPF 0-5 (A) NONE SEEN   WBC, UA 6-30 0 - 5 WBC/hpf   RBC / HPF TOO NUMEROUS TO COUNT 0 - 5 RBC/hpf   Bacteria, UA MANY (A) NONE SEEN     Imaging Review Ct Renal Stone Study  04/02/2015  CLINICAL DATA:  Right-sided flank pain for 3 weeks EXAM: CT ABDOMEN AND PELVIS WITHOUT CONTRAST TECHNIQUE: Multidetector CT imaging of the abdomen and pelvis was performed following the standard protocol without IV contrast. COMPARISON:  03/12/2015 FINDINGS: Lung bases are free of acute infiltrate or sizable effusion. The liver is diffusely fatty infiltrated. The gallbladder has been surgically removed. The spleen, adrenal glands and pancreas are all normal in their CT appearance. The left kidney shows no obstructive change. A tiny left upper pole renal stone is noted measuring 2-3 mm. The right kidney demonstrates hydronephrosis and hydroureter which extends to the mid right ureter. At this point there is a 5 mm stone identified causing the obstructive change. The more distal right ureter is also  mildly full and extends to the level of the urinary bladder. There is a second right ureteral stone  identified at the right UVJ which measures approximately 3 mm. A tiny 1-2 mm right lower pole renal stone is noted as well. The appendix is not visualize consistent with a prior surgical history. The bladder is partially distended. No pelvic mass lesion or sidewall abnormality is noted. No bony abnormality is seen. A spinal stimulator is noted. IMPRESSION: Two right ureteral stones causing hydronephrosis and hydroureter as described. Nonobstructing bilateral renal calculi. Electronically Signed   By: Inez Catalina M.D.   On: 04/02/2015 08:18   I have personally reviewed and evaluated these images and lab results as part of my medical decision-making.   EKG Interpretation None      MDM   Final diagnoses:  Right ureteral stone    Patient with a history of chronic back pain. Chronic nephrolithiasis. And status post left orchiectomy. Patient with acute onset of worsening right flank pain radiating into the groin and penis. Patient states different than his chronic pain.  CT scan confirmed not to ureteral stones on the right side. Once 5 mm and near the bladder. The other 3 mm and more proximal ureter. Both clearly Explain the increase in pain. Patient is followed by urology Dr. Rosana Hoes at his [place] office.    Fredia Sorrow, MD 04/02/15 1027  Fredia Sorrow, MD 04/02/15 1027

## 2015-04-02 NOTE — ED Notes (Signed)
Attempt x 2 to start IV unsuccessful.

## 2015-04-02 NOTE — Discharge Instructions (Signed)
Make appointment to follow-up with your Urologist. Take the Naprosyn as directed take the Zofran as needed for nausea and vomiting. Take your other pain medicines as directed. Today's CT scan did show evidence of 2 kidney stones in the right ureter. One very close to the bladder and the other one higher up. Should be able to pass both of these but follow-up with urology will be important.

## 2015-04-02 NOTE — ED Notes (Signed)
C/o penis pain states hx of kidney stones. Low right back pain. Nausea, no vomiting. Onset last pm.

## 2015-04-08 ENCOUNTER — Emergency Department (HOSPITAL_BASED_OUTPATIENT_CLINIC_OR_DEPARTMENT_OTHER): Payer: No Typology Code available for payment source

## 2015-04-08 ENCOUNTER — Emergency Department (HOSPITAL_BASED_OUTPATIENT_CLINIC_OR_DEPARTMENT_OTHER)
Admission: EM | Admit: 2015-04-08 | Discharge: 2015-04-08 | Disposition: A | Discharge status: Home / Self Care | Payer: No Typology Code available for payment source | Attending: Emergency Medicine | Admitting: Emergency Medicine

## 2015-04-08 ENCOUNTER — Encounter (HOSPITAL_BASED_OUTPATIENT_CLINIC_OR_DEPARTMENT_OTHER): Payer: Self-pay | Admitting: Emergency Medicine

## 2015-04-08 DIAGNOSIS — R109 Unspecified abdominal pain: Secondary | ICD-10-CM

## 2015-04-08 DIAGNOSIS — Z79899 Other long term (current) drug therapy: Secondary | ICD-10-CM | POA: Insufficient documentation

## 2015-04-08 DIAGNOSIS — G4700 Insomnia, unspecified: Secondary | ICD-10-CM | POA: Insufficient documentation

## 2015-04-08 DIAGNOSIS — Z792 Long term (current) use of antibiotics: Secondary | ICD-10-CM | POA: Insufficient documentation

## 2015-04-08 DIAGNOSIS — F329 Major depressive disorder, single episode, unspecified: Secondary | ICD-10-CM | POA: Insufficient documentation

## 2015-04-08 DIAGNOSIS — K219 Gastro-esophageal reflux disease without esophagitis: Secondary | ICD-10-CM | POA: Insufficient documentation

## 2015-04-08 DIAGNOSIS — N200 Calculus of kidney: Secondary | ICD-10-CM | POA: Insufficient documentation

## 2015-04-08 LAB — COMPREHENSIVE METABOLIC PANEL
ALT: 26 U/L (ref 17–63)
AST: 20 U/L (ref 15–41)
Albumin: 5.1 g/dL — ABNORMAL HIGH (ref 3.5–5.0)
Alkaline Phosphatase: 62 U/L (ref 38–126)
Anion gap: 10 (ref 5–15)
BUN: 20 mg/dL (ref 6–20)
CO2: 26 mmol/L (ref 22–32)
Calcium: 9.3 mg/dL (ref 8.9–10.3)
Chloride: 103 mmol/L (ref 101–111)
Creatinine, Ser: 0.93 mg/dL (ref 0.61–1.24)
GFR calc Af Amer: >60 mL/min (ref >60)
GFR calc non Af Amer: >60 mL/min (ref >60)
Glucose, Bld: 97 mg/dL (ref 65–99)
Potassium: 4 mmol/L (ref 3.5–5.1)
Sodium: 139 mmol/L (ref 135–145)
Total Bilirubin: 1 mg/dL (ref 0.3–1.2)
Total Protein: 7.9 g/dL (ref 6.5–8.1)

## 2015-04-08 LAB — URINE MICROSCOPIC-ADD ON: RBC / HPF: NONE SEEN RBC/hpf (ref 0–5)

## 2015-04-08 LAB — URINALYSIS, ROUTINE W REFLEX MICROSCOPIC
Glucose, UA: NEGATIVE mg/dL
Hgb urine dipstick: NEGATIVE
Ketones, ur: NEGATIVE mg/dL
Nitrite: NEGATIVE
Protein, ur: NEGATIVE mg/dL
Specific Gravity, Urine: 1.037 — ABNORMAL HIGH (ref 1.005–1.030)
pH: 6 (ref 5.0–8.0)

## 2015-04-08 LAB — CBC WITH DIFFERENTIAL/PLATELET
Basophils Absolute: 0.1 10*3/uL (ref 0.0–0.1)
Basophils Relative: 0 %
Eosinophils Absolute: 0.2 10*3/uL (ref 0.0–0.7)
Eosinophils Relative: 2 %
HCT: 49.7 % (ref 39.0–52.0)
Hemoglobin: 17.2 g/dL — ABNORMAL HIGH (ref 13.0–17.0)
Lymphocytes Relative: 26 %
Lymphs Abs: 3.1 10*3/uL (ref 0.7–4.0)
MCH: 30.6 pg (ref 26.0–34.0)
MCHC: 34.6 g/dL (ref 30.0–36.0)
MCV: 88.3 fL (ref 78.0–100.0)
Monocytes Absolute: 1.1 10*3/uL — ABNORMAL HIGH (ref 0.1–1.0)
Monocytes Relative: 9 %
Neutro Abs: 7.5 10*3/uL (ref 1.7–7.7)
Neutrophils Relative %: 63 %
Platelets: 380 10*3/uL (ref 150–400)
RBC: 5.63 MIL/uL (ref 4.22–5.81)
RDW: 13.6 % (ref 11.5–15.5)
WBC: 12 10*3/uL — ABNORMAL HIGH (ref 4.0–10.5)

## 2015-04-08 MED ORDER — HYDROMORPHONE HCL 1 MG/ML IJ SOLN
1.0000 mg | Freq: Once | INTRAMUSCULAR | Status: AC
  Administered 2015-04-08: 1 mg via INTRAVENOUS
  Filled 2015-04-08: qty 1

## 2015-04-08 MED ORDER — NAPROXEN 250 MG PO TABS: 250.0000 mg | ORAL_TABLET | Freq: Two times a day (BID) | ORAL | Status: DC

## 2015-04-08 MED ORDER — SODIUM CHLORIDE 0.9 % IV BOLUS (SEPSIS)
1000.0000 mL | Freq: Once | INTRAVENOUS | Status: AC
  Administered 2015-04-08: 1000 mL via INTRAVENOUS

## 2015-04-08 MED ORDER — ONDANSETRON HCL 4 MG/2ML IJ SOLN
4.0000 mg | Freq: Once | INTRAMUSCULAR | Status: AC
  Administered 2015-04-08: 4 mg via INTRAVENOUS
  Filled 2015-04-08: qty 2

## 2015-04-08 MED ORDER — KETOROLAC TROMETHAMINE 30 MG/ML IJ SOLN
30.0000 mg | Freq: Once | INTRAMUSCULAR | Status: AC
  Administered 2015-04-08: 30 mg via INTRAVENOUS
  Filled 2015-04-08: qty 1

## 2015-04-08 MED ORDER — OXYCODONE-ACETAMINOPHEN 5-325 MG PO TABS
2.0000 | ORAL_TABLET | Freq: Once | ORAL | Status: AC
  Administered 2015-04-08: 2 via ORAL
  Filled 2015-04-08: qty 2

## 2015-04-08 NOTE — ED Provider Notes (Signed)
CSN: [id number]     Arrival date & time 04/08/15  1700 History   First MD Initiated Contact with Patient 04/08/15 1717     Chief Complaint  Patient presents with  . Flank Pain    Casey Caldwell is a 35 y.o. male Who presents to the emergency department complaining of left flank pain for the past 2 days it has worsened today. She reports the pain radiates into his groin and penis area. He reports is typical for him. He also reports his urine color has become darker. Some discomfort while urinating. Patient has a history of chronic back pain and chronic kidney stones. He has chronic groin pain. He is followed by pain management. He reports having oxycodone 20 mg earlier today without relief. He is followed by urologist Dr. Rosana Hoes at [facility]. 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 He reports one episode of diarrhea today. He denies fevers, hematuria, hematochezia, hematemesis, chest pain, shortness of breath, coughing, penile pain or discharge.  HPI  Past Medical History  Diagnosis Date  . GERD (gastroesophageal reflux disease)   . History of kidney stones   . Seizures ([place]) Childhood  . Depression     and panic/anxiety  . Insomnia   . Kidney stones    Past Surgical History  Procedure Laterality Date  . Lithotripsy      four times over the years; most recent 10/08/12  . Appendectomy  2011  . Laparoscopic cholecystectomy  1996  . Tonsillectomy  1992  . Wrist ganglion excision  2002    Right  . Hydrocele excision Left 06/2012  . Orchiectomy Left 11/2012  . Spinal cord stimulator insertion  2015   Family History  Problem Relation Age of Onset  . Stroke Mother   . Liver disease Mother   . Irritable bowel syndrome Mother   . Kidney disease Mother   . Heart disease Father   . Colon polyps Father   . Colon cancer Neg Hx    Social History  Substance Use Topics  . Smoking status: Never Smoker   . Smokeless tobacco: Never Used  . Alcohol  Use: 0.0 oz/week    0 Standard drinks or equivalent per week     Comment: rarely    Review of Systems  Constitutional: Negative for fever and chills.  HENT: Negative for congestion and sore throat.   Eyes: Negative for visual disturbance.  Respiratory: Negative for cough, shortness of breath and wheezing.   Cardiovascular: Negative for chest pain and palpitations.  Gastrointestinal: Positive for nausea, vomiting, abdominal pain and diarrhea. Negative for blood in stool.  Genitourinary: Positive for flank pain and testicular pain. Negative for dysuria, scrotal swelling and difficulty urinating.  Musculoskeletal: Positive for back pain (chronic ). Negative for neck pain.  Skin: Negative for rash.  Neurological: Negative for syncope and headaches.      Allergies  Tape; Trileptal; and Wellbutrin  Home Medications   Prior to Admission medications   Medication Sig Start Date End Date Taking? Authorizing Provider  docusate sodium (COLACE) 100 MG capsule Take 1 capsule (100 mg total) by mouth 2 (two) times daily. 09/26/13   Tonia Ghent, MD  doxycycline (VIBRA-TABS) 100 MG tablet Take 1 tablet (100 mg total) by mouth 2 (two) times daily. 01/28/15   Tonia Ghent, MD  escitalopram (LEXAPRO) 20 MG tablet TAKE 2 TABLETS BY MOUTH DAILY 02/25/15   Tonia Ghent, MD  HYDROcodone-acetaminophen [facility])  10-325 MG tablet Take 1 tablet by mouth every 6 (six) hours as needed. 03/12/15   Tonia Ghent, MD  HYDROmorphone (DILAUDID) 2 MG tablet Take 1 tablet (2 mg total) by mouth every 4 (four) hours as needed for severe pain (for kidney stones). Sedation caution.  Call if questions. 03/12/15   Tonia Ghent, MD  ibuprofen (ADVIL,MOTRIN) 200 MG tablet Take 800 mg by mouth every 6 (six) hours as needed for mild pain or moderate pain (chronic groin & kidney pain).     Historical Provider, MD  indapamide (LOZOL) 2.5 MG tablet Take 2.5 mg by mouth every morning.     Historical Provider, MD  naproxen  (NAPROSYN) 250 MG tablet Take 1 tablet (250 mg total) by mouth 2 (two) times daily with a meal. 04/08/15   Waynetta Pean, PA-C  ondansetron (ZOFRAN ODT) 4 MG disintegrating tablet Take 1 tablet (4 mg total) by mouth every 8 (eight) hours as needed for nausea or vomiting. 03/12/15   Tonia Ghent, MD  ondansetron (ZOFRAN ODT) 4 MG disintegrating tablet Take 1 tablet (4 mg total) by mouth every 8 (eight) hours as needed for nausea or vomiting. 04/02/15   Fredia Sorrow, MD  ondansetron (ZOFRAN) 4 MG tablet Take 1 tablet (4 mg total) by mouth every 6 (six) hours. 03/21/15   Malvin Johns, MD  pantoprazole (PROTONIX) 40 MG tablet TAKE ONE TABLET BY MOUTH TWICE DAILY 12/22/14   Tonia Ghent, MD  polyethylene glycol powder (GLYCOLAX/MIRALAX) powder MIX 17G IN 4 TO 8 OUNCES OF FLUID AND TAKE TWICE DAILY 02/17/15   Tonia Ghent, MD  potassium chloride (K-DUR) 10 MEQ tablet Take 10 mEq by mouth 2 (two) times daily.    Historical Provider, MD  tamsulosin (FLOMAX) 0.4 MG CAPS capsule Take 1 capsule (0.4 mg total) by mouth daily. 07/15/14   April Palumbo, MD  zolpidem (AMBIEN) 10 MG tablet Take 1 tablet (10 mg total) by mouth at bedtime. Fill on/after 12/17/14 03/12/15   Tonia Ghent, MD   BP 130/79 mmHg  Pulse 64  Temp(Src) 98 F (36.7 C) (Oral)  Resp 18  Ht 6' (1.829 m)  Wt 138.347 kg  BMI 41.36 kg/m2  SpO2 97% Physical Exam  Constitutional: He appears well-developed and well-nourished. No distress.  Nontoxic appearing. Patient appears uncomfortable.  HENT:  Head: Normocephalic and atraumatic.  Mouth/Throat: Oropharynx is clear and moist.  Eyes: Conjunctivae are normal. Pupils are equal, round, and reactive to light. Right eye exhibits no discharge. Left eye exhibits no discharge.  Neck: Neck supple.  Cardiovascular: Normal rate, regular rhythm, normal heart sounds and intact distal pulses.  Exam reveals no gallop and no friction rub.   No murmur heard. Pulmonary/Chest: Effort normal and  breath sounds normal. No respiratory distress. He has no wheezes. He has no rales.  Abdominal: Soft. Bowel sounds are normal. He exhibits no distension. There is tenderness.  Patient has left flank and left upper and lower quadrant tenderness to palpation of his abdomen.  Genitourinary: Penis normal. No penile tenderness.  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 Patient has tenderness to his right testicle. He states this is chronic. No penile discharge or rashes.   Musculoskeletal: He exhibits no edema.  Lymphadenopathy:    He has no cervical adenopathy.  Neurological: He is alert. Coordination normal.  Skin: Skin is warm and dry. No rash noted. He is not diaphoretic. No erythema. No pallor.  Psychiatric: He has a normal mood and affect. His behavior is  normal.  Nursing note and vitals reviewed.   ED Course  Procedures (including critical care time) Labs Review Labs Reviewed  COMPREHENSIVE METABOLIC PANEL - Abnormal; Notable for the following:    Albumin 5.1 (*)    All other components within normal limits  CBC WITH DIFFERENTIAL/PLATELET - Abnormal; Notable for the following:    WBC 12.0 (*)    Hemoglobin 17.2 (*)    Monocytes Absolute 1.1 (*)    All other components within normal limits  URINALYSIS, ROUTINE W REFLEX MICROSCOPIC (NOT AT [facility]) - Abnormal; Notable for the following:    Color, Urine AMBER (*)    Specific Gravity, Urine 1.037 (*)    Bilirubin Urine SMALL (*)    Leukocytes, UA SMALL (*)    All other components within normal limits  URINE MICROSCOPIC-ADD ON - Abnormal; Notable for the following:    Squamous Epithelial / LPF 0-5 (*)    Bacteria, UA RARE (*)    All other components within normal limits    Imaging Review US Renal  04/08/2015  CLINICAL DATA:  Acute onset of right flank pain, radiating to the penis. Initial encounter. EXAM: RENAL / URINARY TRACT ULTRASOUND COMPLETE COMPARISON:  Renal ultrasound performed 03/12/2015, and CT of the abdomen and pelvis performed  04/02/2015 FINDINGS: Right Kidney: Length: 12.8 cm. Echogenicity within normal limits. A 0.5 cm stone is noted at the upper pole of the right kidney. No mass or hydronephrosis visualized. Left Kidney: Length: 13.6 cm. Echogenicity within normal limits. A 0.4 cm stone is noted at the upper pole of the left kidney. No mass or hydronephrosis visualized. Bladder: There is question of a 3 mm stone at the right side of the base of the bladder, along the right vesicoureteral junction, unchanged from the recent prior CT. IMPRESSION: 1. Suggestion of persistent 3 mm stone at the right side of the base of the bladder, along the right vesicoureteral junction, as noted on recent prior CT. This may reflect an intermittently obstructing stone, as no hydronephrosis is seen. 2. Nonobstructing bilateral renal stones seen, measuring up to 5 mm in size. Electronically Signed   By: Garald Balding M.D.   On: 04/08/2015 19:13   I have personally reviewed and evaluated these images and lab results as part of my medical decision-making.   EKG Interpretation None      Filed Vitals:   04/08/15 1719 04/08/15 2017 04/08/15 2137  BP: 160/119 126/94 130/79  Pulse: 83 71 64  Temp: 98 F (36.7 C)    TempSrc: Oral    Resp: 18 18 18   Height: 6' (1.829 m)    Weight: 138.347 kg    SpO2: 100% 99% 97%     MDM   Meds given in ED:  Medications  sodium chloride 0.9 % bolus 1,000 mL (0 mLs Intravenous Stopped 04/08/15 1900)  ketorolac (TORADOL) 30 MG/ML injection 30 mg (30 mg Intravenous Given 04/08/15 1808)  HYDROmorphone (DILAUDID) injection 1 mg (1 mg Intravenous Given 04/08/15 1809)  ondansetron (ZOFRAN) injection 4 mg (4 mg Intravenous Given 04/08/15 1807)  HYDROmorphone (DILAUDID) injection 1 mg (1 mg Intravenous Given 04/08/15 1912)  HYDROmorphone (DILAUDID) injection 1 mg (1 mg Intravenous Given 04/08/15 2059)  ondansetron (ZOFRAN) injection 4 mg (4 mg Intravenous Given 04/08/15 2058)  oxyCODONE-acetaminophen  (PERCOCET/ROXICET) 5-325 MG per tablet 2 tablet (2 tablets Oral Given 04/08/15 2058)  HYDROmorphone (DILAUDID) injection 1 mg (1 mg Intravenous Given 04/08/15 2140)    Discharge Medication List as of 04/08/2015 10:05 PM  Final diagnoses:  Left flank pain  Renal calculus, left   This is a 35 y.o. male Who presents to the emergency department complaining of left flank pain for the past 2 days it has worsened today. She reports the pain radiates into his groin and penis area. He reports is typical for him. He also reports his urine color has become darker. Some discomfort while urinating. Patient has a history of chronic back pain and chronic kidney stones. He has chronic groin pain. He is followed by pain management. He reports having oxycodone 20 mg earlier today without relief. He is followed by urologist Dr. Rosana [facility] at [facility]. On exam the patient is afebrile and nontoxic appearing. He appears to be in pain. He has left flank and left-sided abdominal tenderness to palpation. He has some tenderness to his testicle which the patient reports is chronic and ongoing. CMP reveals preserved kidney function. CBC shows a leukocytosis with a white count of 12,000. It is otherwise unremarkable. Renal ultrasound suggests a persistent 3 mm stone at the right side of the base of the bladder, along the right vesicouretral junction. It may reflect an intermittently obstructing stone. No hydronephrosis is seen. Nonobstructing bilateral renal stones are seen that measure up to 5 mm. I discussed these findings with the patient. Plan is to work on pain control to get him home and have him follow-up with his urologist tomorrow. Patient is in agreement with this plan. After patient received Dilaudid and then 2 Percocet he reports feeling better and his pain controlled enough to be discharged. He has chronic narcotic pain medicines at home for use. Encouraged him to see his naproxen at home for his pain control. I  discussed return precautions. I advised the patient to follow-up with their primary care provider this week. I advised the patient to return to the emergency department with new or worsening symptoms or new concerns. The patient verbalized understanding and agreement with plan.     This patient was discussed with and evaluated by Dr. Jeneen Rinks who agrees with assessment and plan.    Waynetta Pean, PA-C 04/09/15 0030  Tanna Furry, MD 04/18/15 [phone]

## 2015-04-08 NOTE — Discharge Instructions (Signed)
Dietary Guidelines to Help Prevent Kidney Stones Your risk of kidney stones can be decreased by adjusting the foods you eat. The most important thing you can do is drink enough fluid. You should drink enough fluid to keep your urine clear or pale yellow. The following guidelines provide specific information for the type of kidney stone you have had. GUIDELINES ACCORDING TO TYPE OF KIDNEY STONE Calcium Oxalate Kidney Stones  Reduce the amount of salt you eat. Foods that have a lot of salt cause your body to release excess calcium into your urine. The excess calcium can combine with a substance called oxalate to form kidney stones.  Reduce the amount of animal protein you eat if the amount you eat is excessive. Animal protein causes your body to release excess calcium into your urine. Ask your dietitian how much protein from animal sources you should be eating.  Avoid foods that are high in oxalates. If you take vitamins, they should have less than 500 mg of vitamin C. Your body turns vitamin C into oxalates. You do not need to avoid fruits and vegetables high in vitamin C. Calcium Phosphate Kidney Stones  Reduce the amount of salt you eat to help prevent the release of excess calcium into your urine.  Reduce the amount of animal protein you eat if the amount you eat is excessive. Animal protein causes your body to release excess calcium into your urine. Ask your dietitian how much protein from animal sources you should be eating.  Get enough calcium from food or take a calcium supplement (ask your dietitian for recommendations). Food sources of calcium that do not increase your risk of kidney stones include:  Broccoli.  Dairy products, such as cheese and yogurt.  Pudding. Uric Acid Kidney Stones  Do not have more than 6 oz of animal protein per day. FOOD SOURCES Animal Protein Sources  Meat (all types).  Poultry.  Eggs.  Fish, seafood. Foods High in [employer] seasonings.  Soy  sauce.  Teriyaki sauce.  Cured and processed meats.  Salted crackers and snack foods.  Fast food.  Canned soups and most canned foods. Foods High in Oxalates  Grains:  Amaranth.  Barley.  Grits.  Wheat germ.  Bran.  Buckwheat flour.  All bran cereals.  Pretzels.  Whole wheat bread.  Vegetables:  Beans (wax).  Beets and beet greens.  Collard greens.  Eggplant.  [place].  Leeks.  Okra.  Parsley.  R[place]abagas.  Spinach.  Swiss chard.  Tomato paste.  Fried potatoes.  Sweet potatoes.  Fruits:  Red currants.  Figs.  Kiwi.  Rhubarb.  Meat and Other Protein Sources:  Beans (dried).  Soy burgers and other soybean products.  Miso.  Nuts (peanuts, almonds, pecans, cashews, hazelnuts).  Nut butters.  Sesame seeds and tahini (paste made of sesame seeds).  Poppy seeds.  Beverages:  Chocolate drink mixes.  Soy milk.  Instant iced tea.  Juices made from high-oxalate fruits or vegetables.  Other:  Carob.  Chocolate.  Fruitcake.  Marmalades.   This information is not intended to replace advice given to you by your health care provider. Make sure you discuss any questions you have with your health care provider.   Document Released: 05/07/2010 Document Revised: 01/15/2013 Document Reviewed: 12/07/2012 Elsevier Interactive Patient Education 2016 Elsevier Inc.  Flank Pain Flank pain refers to pain that is located on the side of the body between the upper abdomen and the back. The pain may occur over a short period  of time (acute) or may be long-term or reoccurring (chronic). It may be mild or severe. Flank pain can be caused by many things. CAUSES  Some of the more common causes of flank pain include:  Muscle strains.   Muscle spasms.   A disease of your spine (vertebral disk disease).   A lung infection (pneumonia).   Fluid around your lungs (pulmonary edema).   A kidney infection.   Kidney stones.    A very painful skin rash caused by the chickenpox virus (shingles).   Gallbladder disease.  [place] care will depend on the cause of your pain. In general,  Rest as directed by your caregiver.  Drink enough fluids to keep your urine clear or pale yellow.  Only take over-the-counter or prescription medicines as directed by your caregiver. Some medicines may help relieve the pain.  Tell your caregiver about any changes in your pain.  Follow up with your caregiver as directed. SEEK IMMEDIATE MEDICAL CARE IF:   Your pain is not controlled with medicine.   You have new or worsening symptoms.  Your pain increases.   You have abdominal pain.   You have shortness of breath.   You have persistent nausea or vomiting.   You have swelling in your abdomen.   You feel faint or pass out.   You have blood in your urine.  You have a fever or persistent symptoms for more than 2-3 days.  You have a fever and your symptoms suddenly get worse. MAKE SURE YOU:   Understand these instructions.  Will watch your condition.  Will get help right away if you are not doing well or get worse.   This information is not intended to replace advice given to you by your health care provider. Make sure you discuss any questions you have with your health care provider.   Document Released: 03/03/2005 Document Revised: 10/05/2011 Document Reviewed: 08/25/2011 Elsevier Interactive Patient Education [employer].

## 2015-04-08 NOTE — ED Provider Notes (Signed)
Pt seen and evaluated on arrival.  Workup initiated.  I had to leave the patients room to assume the care of an emergent pt.  Care assumed by W. Porfirio Mylar PA-C. I agree with his evaluation and workup, as well as disposition.  Patient laying across a bed and writhing back and forth in pain. I doubt that this is his chronic back pain. Likely persistence or recurrence of distal ureteral stone. We have urged urological follow-up  Tanna Furry, MD 04/08/15 2246

## 2015-04-13 ENCOUNTER — Encounter: Payer: Self-pay | Admitting: Family Medicine

## 2015-04-13 MED ORDER — HYDROMORPHONE HCL 2 MG PO TABS: 2.0000 mg | ORAL_TABLET | ORAL | Status: DC | PRN

## 2015-04-13 MED ORDER — HYDROCODONE-ACETAMINOPHEN 10-325 MG PO TABS: 1.0000 | ORAL_TABLET | Freq: Four times a day (QID) | ORAL | Status: DC | PRN

## 2015-04-13 NOTE — Telephone Encounter (Signed)
Printed.  Thanks.  I sent mychart message to patient.

## 2015-04-13 NOTE — Telephone Encounter (Signed)
Pt left v/m requesting rx for dilaudid(last printed # 30 on 03/12/15) and norco(last printed # 120 on 03/12/15). Call when ready for pick up at [phone]. Last seen f/u on 11/05/14. Pt request to pick up on 04/13/15.

## 2015-04-14 NOTE — Telephone Encounter (Signed)
Patient advised.  Rx left at front desk for pick up. 

## 2015-04-20 ENCOUNTER — Encounter: Payer: Self-pay | Admitting: Family Medicine

## 2015-04-22 ENCOUNTER — Other Ambulatory Visit: Payer: Self-pay | Admitting: Family Medicine

## 2015-04-22 DIAGNOSIS — R1030 Lower abdominal pain, unspecified: Secondary | ICD-10-CM

## 2015-04-22 MED ORDER — DULOXETINE HCL 30 MG PO CPEP: 30.0000 mg | ORAL_CAPSULE | Freq: Every day | ORAL | Status: DC

## 2015-04-22 MED ORDER — PANTOPRAZOLE SODIUM 40 MG PO TBEC: DELAYED_RELEASE_TABLET | ORAL | Status: DC

## 2015-04-22 MED ORDER — MELOXICAM 15 MG PO TABS: 15.0000 mg | ORAL_TABLET | Freq: Every day | ORAL | Status: DC

## 2015-04-22 NOTE — Progress Notes (Signed)
Spoke to pharmacist and was advised that Cymbalta only comes in capsule form.  Pharmacist stated that it comes in 20 mg, 30 mg and 60 mg.

## 2015-04-22 NOTE — Progress Notes (Signed)
Please call pharmacy.  Please see if they make a 60mg  cymbalta tab that can be cut in half.  If so, please change cymbalta to 60mg  tab, 1/2 tab PO qd, #45, 1rf.  Thanks.

## 2015-04-23 ENCOUNTER — Telehealth: Payer: Self-pay | Admitting: Family Medicine

## 2015-04-23 NOTE — Telephone Encounter (Signed)
See my chart message

## 2015-04-23 NOTE — Progress Notes (Signed)
Noted. Thanks.

## 2015-04-24 ENCOUNTER — Emergency Department (HOSPITAL_BASED_OUTPATIENT_CLINIC_OR_DEPARTMENT_OTHER): Payer: Self-pay

## 2015-04-24 ENCOUNTER — Emergency Department (HOSPITAL_BASED_OUTPATIENT_CLINIC_OR_DEPARTMENT_OTHER)
Admission: EM | Admit: 2015-04-24 | Discharge: 2015-04-24 | Disposition: A | Discharge status: Home / Self Care | Payer: Self-pay | Attending: Emergency Medicine | Admitting: Emergency Medicine

## 2015-04-24 ENCOUNTER — Encounter (HOSPITAL_BASED_OUTPATIENT_CLINIC_OR_DEPARTMENT_OTHER): Payer: Self-pay | Admitting: Emergency Medicine

## 2015-04-24 DIAGNOSIS — K219 Gastro-esophageal reflux disease without esophagitis: Secondary | ICD-10-CM | POA: Insufficient documentation

## 2015-04-24 DIAGNOSIS — R011 Cardiac murmur, unspecified: Secondary | ICD-10-CM | POA: Insufficient documentation

## 2015-04-24 DIAGNOSIS — Z8669 Personal history of other diseases of the nervous system and sense organs: Secondary | ICD-10-CM | POA: Insufficient documentation

## 2015-04-24 DIAGNOSIS — F329 Major depressive disorder, single episode, unspecified: Secondary | ICD-10-CM | POA: Insufficient documentation

## 2015-04-24 DIAGNOSIS — R000 Tachycardia, unspecified: Secondary | ICD-10-CM | POA: Insufficient documentation

## 2015-04-24 DIAGNOSIS — F410 Panic disorder [episodic paroxysmal anxiety] without agoraphobia: Secondary | ICD-10-CM | POA: Insufficient documentation

## 2015-04-24 DIAGNOSIS — Z79899 Other long term (current) drug therapy: Secondary | ICD-10-CM | POA: Insufficient documentation

## 2015-04-24 DIAGNOSIS — Z87442 Personal history of urinary calculi: Secondary | ICD-10-CM | POA: Insufficient documentation

## 2015-04-24 DIAGNOSIS — R1030 Lower abdominal pain, unspecified: Secondary | ICD-10-CM

## 2015-04-24 DIAGNOSIS — N23 Unspecified renal colic: Secondary | ICD-10-CM | POA: Insufficient documentation

## 2015-04-24 DIAGNOSIS — Z791 Long term (current) use of non-steroidal anti-inflammatories (NSAID): Secondary | ICD-10-CM | POA: Insufficient documentation

## 2015-04-24 LAB — URINALYSIS, ROUTINE W REFLEX MICROSCOPIC
Bilirubin Urine: NEGATIVE
Glucose, UA: NEGATIVE mg/dL
Hgb urine dipstick: NEGATIVE
Ketones, ur: NEGATIVE mg/dL
Leukocytes, UA: NEGATIVE
Nitrite: NEGATIVE
Protein, ur: NEGATIVE mg/dL
Specific Gravity, Urine: 1.021 (ref 1.005–1.030)
pH: 6.5 (ref 5.0–8.0)

## 2015-04-24 LAB — BASIC METABOLIC PANEL
Anion gap: 9 (ref 5–15)
BUN: 11 mg/dL (ref 6–20)
CO2: 26 mmol/L (ref 22–32)
Calcium: 10.2 mg/dL (ref 8.9–10.3)
Chloride: 106 mmol/L (ref 101–111)
Creatinine, Ser: 0.87 mg/dL (ref 0.61–1.24)
GFR calc Af Amer: >60 mL/min (ref >60)
GFR calc non Af Amer: >60 mL/min (ref >60)
Glucose, Bld: 107 mg/dL — ABNORMAL HIGH (ref 65–99)
Potassium: 4.1 mmol/L (ref 3.5–5.1)
Sodium: 141 mmol/L (ref 135–145)

## 2015-04-24 MED ORDER — METOCLOPRAMIDE HCL 5 MG/ML IJ SOLN
10.0000 mg | Freq: Once | INTRAMUSCULAR | Status: AC
  Administered 2015-04-24: 10 mg via INTRAVENOUS
  Filled 2015-04-24: qty 2

## 2015-04-24 MED ORDER — ONDANSETRON HCL 8 MG PO TABS: 8.0000 mg | ORAL_TABLET | Freq: Three times a day (TID) | ORAL | Status: DC | PRN

## 2015-04-24 MED ORDER — HYDROMORPHONE HCL 1 MG/ML IJ SOLN
2.0000 mg | Freq: Once | INTRAMUSCULAR | Status: AC
  Administered 2015-04-24: 2 mg via INTRAVENOUS
  Filled 2015-04-24: qty 2

## 2015-04-24 MED ORDER — ONDANSETRON 8 MG PO TBDP
8.0000 mg | ORAL_TABLET | Freq: Once | ORAL | Status: AC
  Administered 2015-04-24: 8 mg via ORAL
  Filled 2015-04-24: qty 1

## 2015-04-24 MED FILL — ONDANSETRON ODT 8 MG TABLET: 8 | 4 days supply | Qty: 12 | Fill #0

## 2015-04-24 NOTE — Discharge Instructions (Signed)
Your ultrasound does not show any evidence of blockage or a stone in your ureter, however we suspect that you may be passing kidney stones. Take your hydrocodone as prescribed. Take the Zofran as needed for nausea. Contact your urologist in 3 days to arrange a follow-up visit in the office Flank Pain Flank pain refers to pain that is located on the side of the body between the upper abdomen and the back. The pain may occur over a short period of time (acute) or may be long-term or reoccurring (chronic). It may be mild or severe. Flank pain can be caused by many things. CAUSES  Some of the more common causes of flank pain include:  Muscle strains.   Muscle spasms.   A disease of your spine (vertebral disk disease).   A lung infection (pneumonia).   Fluid around your lungs (pulmonary edema).   A kidney infection.   Kidney stones.   A very painful skin rash caused by the chickenpox virus (shingles).   Gallbladder disease.  [place] care will depend on the cause of your pain. In general,  Rest as directed by your caregiver.  Drink enough fluids to keep your urine clear or pale yellow.  Only take over-the-counter or prescription medicines as directed by your caregiver. Some medicines may help relieve the pain.  Tell your caregiver about any changes in your pain.  Follow up with your caregiver as directed. SEEK IMMEDIATE MEDICAL CARE IF:   Your pain is not controlled with medicine.   You have new or worsening symptoms.  Your pain increases.   You have abdominal pain.   You have shortness of breath.   You have persistent nausea or vomiting.   You have swelling in your abdomen.   You feel faint or pass out.   You have blood in your urine.  You have a fever or persistent symptoms for more than 2-3 days.  You have a fever and your symptoms suddenly get worse. MAKE SURE YOU:   Understand these instructions.  Will watch your  condition.  Will get help right away if you are not doing well or get worse.   This information is not intended to replace advice given to you by your health care provider. Make sure you discuss any questions you have with your health care provider.   Document Released: 03/03/2005 Document Revised: 10/05/2011 Document Reviewed: 08/25/2011 Elsevier Interactive Patient Education [employer].

## 2015-04-24 NOTE — ED Provider Notes (Signed)
CSN: [id number]     Arrival date & time 04/24/15  1428 History   First MD Initiated Contact with Patient 04/24/15 1502     Chief Complaint  Patient presents with  . Flank Pain     (Consider location/radiation/quality/duration/timing/severity/associated sxs/prior Treatment) HPI Complains of groin pain typical of kidney stone onset this morning. Patient vomited prior to coming here. He states feels like kidney studies had in the past. Patient would not provide further verbal history. When I asked him if he had taken his hydrocodone prescribed he became verbal abusive, yelling obscenities at me and at staff Past Medical History  Diagnosis Date  . GERD (gastroesophageal reflux disease)   . History of kidney stones   . Seizures ([place]) Childhood  . Depression     and panic/anxiety  . Insomnia   . Kidney stones    Past Surgical History  Procedure Laterality Date  . Lithotripsy      four times over the years; most recent 10/08/12  . Appendectomy  2011  . Laparoscopic cholecystectomy  1996  . Tonsillectomy  1992  . Wrist ganglion excision  2002    Right  . Hydrocele excision Left 06/2012  . Orchiectomy Left 11/2012  . Spinal cord stimulator insertion  2015   Family History  Problem Relation Age of Onset  . Stroke Mother   . Liver disease Mother   . Irritable bowel syndrome Mother   . Kidney disease Mother   . Heart disease Father   . Colon polyps Father   . Colon cancer Neg Hx    Social History  Substance Use Topics  . Smoking status: Never Smoker   . Smokeless tobacco: Never Used  . Alcohol Use: 0.0 oz/week    0 Standard drinks or equivalent per week     Comment: rarely    Review of Systems  Constitutional: Negative.   HENT: Negative.   Respiratory: Negative.   Cardiovascular: Negative.   Gastrointestinal: Positive for vomiting.       Groin pain  Genitourinary: Positive for flank pain.  Musculoskeletal: Negative.   Skin: Negative.   Neurological: Negative.    Psychiatric/Behavioral: Negative.       Allergies  Tape; Trileptal; and Wellbutrin  Home Medications   Prior to Admission medications   Medication Sig Start Date End Date Taking? Authorizing Provider  DULoxetine (CYMBALTA) 30 MG capsule Take 1 capsule (30 mg total) by mouth daily. 04/22/15  Yes Tonia Ghent, MD  escitalopram (LEXAPRO) 20 MG tablet TAKE 2 TABLETS BY MOUTH DAILY 02/25/15  Yes Tonia Ghent, MD  HYDROcodone-acetaminophen [facility]) 10-325 MG tablet Take 1 tablet by mouth every 6 (six) hours as needed. 04/13/15  Yes Tonia Ghent, MD  HYDROmorphone (DILAUDID) 2 MG tablet Take 1 tablet (2 mg total) by mouth every 4 (four) hours as needed for severe pain (for kidney stones). Sedation caution.  Call if questions. 04/13/15  Yes Tonia Ghent, MD  meloxicam (MOBIC) 15 MG tablet Take 1 tablet (15 mg total) by mouth daily. With food 04/22/15  Yes Tonia Ghent, MD  pantoprazole (PROTONIX) 40 MG tablet TAKE ONE TABLET BY MOUTH TWICE DAILY 04/22/15  Yes Tonia Ghent, MD  polyethylene glycol powder (GLYCOLAX/MIRALAX) powder MIX 17G IN 4 TO 8 OUNCES OF FLUID AND TAKE TWICE DAILY 02/17/15  Yes Tonia Ghent, MD  tamsulosin (FLOMAX) 0.4 MG CAPS capsule Take 1 capsule (0.4 mg total) by mouth daily. 07/15/14  Yes April Palumbo, MD  zolpidem (  AMBIEN) 10 MG tablet Take 1 tablet (10 mg total) by mouth at bedtime. Fill on/after 12/17/14 03/12/15  Yes Tonia Ghent, MD  docusate sodium (COLACE) 100 MG capsule Take 1 capsule (100 mg total) by mouth 2 (two) times daily. 09/26/13   Tonia Ghent, MD  ibuprofen (ADVIL,MOTRIN) 200 MG tablet Take 800 mg by mouth every 6 (six) hours as needed for mild pain or moderate pain (chronic groin & kidney pain).     Historical Provider, MD  indapamide (LOZOL) 2.5 MG tablet Take 2.5 mg by mouth every morning.     Historical Provider, MD  potassium chloride (K-DUR) 10 MEQ tablet Take 10 mEq by mouth 2 (two) times daily.    Historical Provider, MD   BP  191/137 mmHg  Pulse 132  Temp(Src)   Resp 22  Ht 6' (1.829 m)  Wt 310 lb (140.615 kg)  BMI 42.03 kg/m2  SpO2 97% Physical Exam  Constitutional: He appears distressed.  Appears uncomfortable  HENT:  Head: Normocephalic and atraumatic.  Neck: Neck supple.  Cardiovascular:  Murmur heard. Tachycardic  Pulmonary/Chest: Effort normal.  Abdominal: Soft. Bowel sounds are normal.  Obese  Genitourinary: Penis normal.  Scrotum appears empty No flank tenderness  Musculoskeletal: Normal range of motion.  Neurological: He is alert.  Skin: Skin is warm and dry.  Psychiatric: He has a normal mood and affect.  Nursing note and vitals reviewed.   ED Course  Procedures (including critical care time) Labs Review Labs Reviewed - No data to display  Imaging Review No results found. I have personally reviewed and evaluated these images and lab results as part of my medical decision-making.   EKG Interpretation None      Patient refused further exam, though I offered repeatedly to examine him. After patient spoke with his PMD via telephone he became more cooperative and allowed me to take history. Pain radiates from bilateral groin to right flank typical of his kidney stones but he's had in the past. Nothing makes pain better or worse. Pain is severe. He also suffers from chronic back pain and has a nerve stimulator. No treatment prior to coming here  4:05 PM feels improved after treatment with intravenous hydromorphone and intravenous Reglan. 5:05 PM pain and nausea are under control. Results for orders placed or performed during the hospital encounter of 04/24/15  Urinalysis, Routine w reflex microscopic (not at [facility])  Result Value Ref Range   Color, Urine YELLOW YELLOW   APPearance CLEAR CLEAR   Specific Gravity, Urine 1.021 1.005 - 1.030   pH 6.5 5.0 - 8.0   Glucose, UA NEGATIVE NEGATIVE mg/dL   Hgb urine dipstick NEGATIVE NEGATIVE   Bilirubin Urine NEGATIVE NEGATIVE   Ketones, ur  NEGATIVE NEGATIVE mg/dL   Protein, ur NEGATIVE NEGATIVE mg/dL   Nitrite NEGATIVE NEGATIVE   Leukocytes, UA NEGATIVE NEGATIVE  Basic metabolic panel  Result Value Ref Range   Sodium 141 135 - 145 mmol/L   Potassium 4.1 3.5 - 5.1 mmol/L   Chloride 106 101 - 111 mmol/L   CO2 26 22 - 32 mmol/L   Glucose, Bld 107 (H) 65 - 99 mg/dL   BUN 11 6 - 20 mg/dL   Creatinine, Ser 0.87 0.61 - 1.24 mg/dL   Calcium 10.2 8.9 - 10.3 mg/dL   GFR calc non Af Amer >60 >60 mL/min   GFR calc Af Amer >60 >60 mL/min   Anion gap 9 5 - 15   US Renal  04/24/2015  CLINICAL DATA:  Bilateral frank and groin pain, right greater than left, for 1 day. History of nephrolithiasis and lithotripsy. EXAM: RENAL / URINARY TRACT ULTRASOUND COMPLETE COMPARISON:  04/08/2015 renal sonogram and 04/02/2015 CT abdomen/pelvis. FINDINGS: Right Kidney: Length: 11.9 cm. Normal right renal echogenicity and size. Mild fullness of the central right renal collecting system without overt right hydronephrosis. No renal stones large enough to cause acoustic shadowing. No renal mass. Incidental echogenic right liver parenchyma, in keeping with hepatic steatosis as seen on prior CT. Left Kidney: Length: 13.4 cm. Echogenicity within normal limits. No mass or hydronephrosis visualized. Nonobstructing 3-4 mm stone in the upper left kidney. Bladder: Nearly collapsed and grossly normal urinary bladder. No definite shadowing stone at the ureterovesical junctions. No bladder stones. IMPRESSION: 1. No evidence of acute urinary tract obstruction. Mild fullness of the central right renal collecting system without overt right hydronephrosis. No left hydronephrosis. 2. Nonobstructing left renal stone. No right renal stones detected. No residual stone appreciated at the ureterovesical junctions, although bladder evaluation is limited by underdistention. Electronically Signed   By: Ilona Sorrel M.D.   On: 04/24/2015 16:40   US Renal  04/08/2015  CLINICAL DATA:  Acute  onset of right flank pain, radiating to the penis. Initial encounter. EXAM: RENAL / URINARY TRACT ULTRASOUND COMPLETE COMPARISON:  Renal ultrasound performed 03/12/2015, and CT of the abdomen and pelvis performed 04/02/2015 FINDINGS: Right Kidney: Length: 12.8 cm. Echogenicity within normal limits. A 0.5 cm stone is noted at the upper pole of the right kidney. No mass or hydronephrosis visualized. Left Kidney: Length: 13.6 cm. Echogenicity within normal limits. A 0.4 cm stone is noted at the upper pole of the left kidney. No mass or hydronephrosis visualized. Bladder: There is question of a 3 mm stone at the right side of the base of the bladder, along the right vesicoureteral junction, unchanged from the recent prior CT. IMPRESSION: 1. Suggestion of persistent 3 mm stone at the right side of the base of the bladder, along the right vesicoureteral junction, as noted on recent prior CT. This may reflect an intermittently obstructing stone, as no hydronephrosis is seen. 2. Nonobstructing bilateral renal stones seen, measuring up to 5 mm in size. Electronically Signed   By: Garald Balding M.D.   On: 04/08/2015 19:13   Ct Renal Stone Study  04/02/2015  CLINICAL DATA:  Right-sided flank pain for 3 weeks EXAM: CT ABDOMEN AND PELVIS WITHOUT CONTRAST TECHNIQUE: Multidetector CT imaging of the abdomen and pelvis was performed following the standard protocol without IV contrast. COMPARISON:  03/12/2015 FINDINGS: Lung bases are free of acute infiltrate or sizable effusion. The liver is diffusely fatty infiltrated. The gallbladder has been surgically removed. The spleen, adrenal glands and pancreas are all normal in their CT appearance. The left kidney shows no obstructive change. A tiny left upper pole renal stone is noted measuring 2-3 mm. The right kidney demonstrates hydronephrosis and hydroureter which extends to the mid right ureter. At this point there is a 5 mm stone identified causing the obstructive change. The more  distal right ureter is also mildly full and extends to the level of the urinary bladder. There is a second right ureteral stone identified at the right UVJ which measures approximately 3 mm. A tiny 1-2 mm right lower pole renal stone is noted as well. The appendix is not visualize consistent with a prior surgical history. The bladder is partially distended. No pelvic mass lesion or sidewall abnormality is noted. No bony abnormality  is seen. A spinal stimulator is noted. IMPRESSION: Two right ureteral stones causing hydronephrosis and hydroureter as described. Nonobstructing bilateral renal calculi. Electronically Signed   By: Inez Catalina M.D.   On: 04/02/2015 08:18    MDM  Intervals are consistent with ureteral colic. I suggested that he follow-up with his urologist. Plan prescription Zofran. He has hydrocodone for pain.  Diagnosis ureteral colic  Final diagnoses:  Groin pain, unspecified laterality        Orlie Dakin, MD 04/24/15 1714

## 2015-04-24 NOTE — ED Notes (Signed)
Pt reports that he has had multiple kidney stones in the past, started having pain on Sunday, today pain became a lot worse, pt states she has urologist but has not talked to them

## 2015-04-24 NOTE — ED Notes (Signed)
Overheard pt yelling and swearing. Security and [employer] in pt's room trying to calm pt down. Pt initially resistant to attempts to help him regain control of his behavior. Pt demanding to see a different physician due to feeling like he had been treated poorly and "like a drug seeker". Pt informed that we did not have another physician for him to see. Pt continued yelling and swearing so he was asked to leave the property. Pt refused despite being informed that if he did not leave, he would be arrested. During this time, pt was attempting to reach his PCP, Dr. Elsie Stain, buy telephone. Once Dr. Damita Dunnings was on the telephone, the pt became more calm and regained control of his behavior. Dr. Damita Dunnings confirmed that the pt did have chronic pain issues but also had a history of kidney stones that did cause him to have breakthrough pain not resolved with po pain meds. Dr. Winfred Leeds agrees to see/treat the patient and pt agrees to be seen/treated by Dr. Winfred Leeds. Clear expectations to remain in control of his behavior with no yelling or swearing explained to pt who is in agreement. Pt verbalizes understanding that any further outbursts and he will have to leave the property immediately or be arrested. Pt's primary RN and charge nurse updated on situation and poc.

## 2015-04-26 ENCOUNTER — Telehealth: Payer: Self-pay | Admitting: Family Medicine

## 2015-04-26 NOTE — Telephone Encounter (Signed)
Late entry.  Patient had called me from ER.  I talked with ER asst director, then talked with patient again.  I asked asst director to see if ER MD would be willing to see patient again, assuming the patient was cooperative.   I appreciate help of all involved.

## 2015-04-29 ENCOUNTER — Telehealth: Payer: Self-pay | Admitting: Family Medicine

## 2015-04-29 DIAGNOSIS — R1030 Lower abdominal pain, unspecified: Secondary | ICD-10-CM

## 2015-04-29 MED ORDER — PANTOPRAZOLE SODIUM 40 MG PO TBEC: DELAYED_RELEASE_TABLET | ORAL | Status: DC

## 2015-04-29 MED ORDER — MELOXICAM 15 MG PO TABS: 15.0000 mg | ORAL_TABLET | Freq: Every day | ORAL | Status: DC

## 2015-04-29 MED ORDER — DULOXETINE HCL 30 MG PO CPEP: 30.0000 mg | ORAL_CAPSULE | Freq: Every day | ORAL | Status: DC

## 2015-04-29 NOTE — Telephone Encounter (Signed)
See my chart message

## 2015-05-15 ENCOUNTER — Encounter: Payer: Self-pay | Admitting: Family Medicine

## 2015-05-17 ENCOUNTER — Other Ambulatory Visit: Payer: Self-pay | Admitting: Family Medicine

## 2015-05-17 MED ORDER — HYDROCODONE-ACETAMINOPHEN 10-325 MG PO TABS: 1.0000 | ORAL_TABLET | Freq: Four times a day (QID) | ORAL | Status: DC | PRN

## 2015-05-17 MED ORDER — HYDROMORPHONE HCL 2 MG PO TABS: 2.0000 mg | ORAL_TABLET | ORAL | Status: DC | PRN

## 2015-05-18 ENCOUNTER — Encounter (HOSPITAL_BASED_OUTPATIENT_CLINIC_OR_DEPARTMENT_OTHER): Payer: Self-pay | Admitting: *Deleted

## 2015-05-18 ENCOUNTER — Emergency Department (HOSPITAL_BASED_OUTPATIENT_CLINIC_OR_DEPARTMENT_OTHER): Payer: No Typology Code available for payment source

## 2015-05-18 ENCOUNTER — Emergency Department (HOSPITAL_BASED_OUTPATIENT_CLINIC_OR_DEPARTMENT_OTHER)
Admission: EM | Admit: 2015-05-18 | Discharge: 2015-05-18 | Disposition: A | Discharge status: Home / Self Care | Payer: No Typology Code available for payment source | Attending: Emergency Medicine | Admitting: Emergency Medicine

## 2015-05-18 DIAGNOSIS — N23 Unspecified renal colic: Secondary | ICD-10-CM | POA: Insufficient documentation

## 2015-05-18 DIAGNOSIS — F329 Major depressive disorder, single episode, unspecified: Secondary | ICD-10-CM | POA: Insufficient documentation

## 2015-05-18 LAB — URINALYSIS, ROUTINE W REFLEX MICROSCOPIC
Bilirubin Urine: NEGATIVE
Glucose, UA: NEGATIVE mg/dL
Hgb urine dipstick: NEGATIVE
Ketones, ur: NEGATIVE mg/dL
Leukocytes, UA: NEGATIVE
Nitrite: NEGATIVE
Protein, ur: NEGATIVE mg/dL
Specific Gravity, Urine: 1.022 (ref 1.005–1.030)
pH: 6 (ref 5.0–8.0)

## 2015-05-18 MED ORDER — KETOROLAC TROMETHAMINE 60 MG/2ML IM SOLN
60.0000 mg | Freq: Once | INTRAMUSCULAR | Status: AC
  Administered 2015-05-18: 60 mg via INTRAMUSCULAR
  Filled 2015-05-18: qty 2

## 2015-05-18 MED ORDER — HYDROMORPHONE HCL 1 MG/ML IJ SOLN
2.0000 mg | Freq: Once | INTRAMUSCULAR | Status: AC
  Administered 2015-05-18: 2 mg via INTRAMUSCULAR
  Filled 2015-05-18: qty 2

## 2015-05-18 MED ORDER — ONDANSETRON 4 MG PO TBDP
4.0000 mg | ORAL_TABLET | Freq: Once | ORAL | Status: AC
  Administered 2015-05-18: 4 mg via ORAL
  Filled 2015-05-18: qty 1

## 2015-05-18 NOTE — ED Provider Notes (Signed)
CSN: [id number]     Arrival date & time 05/18/15  1725 History  By signing my name below, I, Doran Stabler, attest that this documentation has been prepared under the direction and in the presence of No att. providers found. Electronically Signed: Doran Stabler, ED Scribe. 05/25/2015. 6:15 PM.   Chief Complaint  Patient presents with  . Flank Pain   The history is provided by the patient. No language interpreter was used.   HPI Comments: Casey Caldwell is a 35 y.o. male who presents to the Emergency Department with a PMHx of GERD and kidney stones complaining of right sided groin pain radiating to right flank and back that worsened two days ago. Pt also reports nausea and hematuria. Pt describes his sharp pain as if "lightening is going through him." Pt also reports a sqeezing pain to his right testicle. He states he has chronic groin and back pain. He takes hydromorphone and hydrocodone for his chronic pain. He took both for his worsening pain with no relief. Pt reports his symptoms are similar to his pain with kidney stones. Pt chronically has trouble moving his bowels but has been on Miralax. He last had a normal BM today. Pt denies any vomiting, dysuria, or any other symptoms at this time.   Dr. Damita Dunnings, manages chronic pain. Pt is in the process of changing his doctor since Dr. Damita Dunnings moved away.  Sees Dr. Rosana Hoes, [facility].   Past Medical History  Diagnosis Date  . GERD (gastroesophageal reflux disease)   . History of kidney stones   . Seizures ([place]) Childhood  . Depression     and panic/anxiety  . Insomnia   . Kidney stones    Past Surgical History  Procedure Laterality Date  . Lithotripsy      four times over the years; most recent 10/08/12  . Appendectomy  2011  . Laparoscopic cholecystectomy  1996  . Tonsillectomy  1992  . Wrist ganglion excision  2002    Right  . Hydrocele excision Left 06/2012  . Orchiectomy Left 11/2012  . Spinal cord stimulator insertion  2015    Family History  Problem Relation Age of Onset  . Stroke Mother   . Liver disease Mother   . Irritable bowel syndrome Mother   . Kidney disease Mother   . Heart disease Father   . Colon polyps Father   . Colon cancer Neg Hx    Social History  Substance Use Topics  . Smoking status: Never Smoker   . Smokeless tobacco: Never Used  . Alcohol Use: 0.0 oz/week    0 Standard drinks or equivalent per week     Comment: rarely    Review of Systems  Constitutional: Negative for fever and chills.  Respiratory: Negative for shortness of breath.   Cardiovascular: Negative for chest pain.  Gastrointestinal: Positive for nausea, abdominal pain and constipation. Negative for vomiting.  Genitourinary: Positive for hematuria, flank pain and testicular pain. Negative for dysuria, frequency and scrotal swelling.  Musculoskeletal: Negative for myalgias and neck pain.  Skin: Negative for rash and wound.  Neurological: Negative for dizziness, syncope, weakness, light-headedness, numbness and headaches.  All other systems reviewed and are negative.   Allergies  Tape; Trileptal; and Wellbutrin  Home Medications   Prior to Admission medications   Medication Sig Start Date End Date Taking? Authorizing Provider  docusate sodium (COLACE) 100 MG capsule Take 1 capsule (100 mg total) by mouth 2 (two) times daily. 09/26/13   Phillip Heal  Josefine Class, MD  DULoxetine (CYMBALTA) 30 MG capsule Take 2 capsules (60 mg total) by mouth daily. 05/20/15   Tonia Ghent, MD  escitalopram (LEXAPRO) 20 MG tablet TAKE 2 TABLETS BY MOUTH DAILY 02/25/15   Tonia Ghent, MD  HYDROcodone-acetaminophen [facility]) 10-325 MG tablet Take 1 tablet by mouth every 6 (six) hours as needed. 05/17/15   Tonia Ghent, MD  HYDROmorphone (DILAUDID) 2 MG tablet Take 1 tablet (2 mg total) by mouth every 4 (four) hours as needed for severe pain (for kidney stones). Sedation caution.  Call if questions. 05/17/15   Tonia Ghent, MD  ibuprofen  (ADVIL,MOTRIN) 200 MG tablet Take 800 mg by mouth every 6 (six) hours as needed for mild pain or moderate pain (chronic groin & kidney pain).     Historical Provider, MD  indapamide (LOZOL) 2.5 MG tablet Take 2.5 mg by mouth every morning.     Historical Provider, MD  meloxicam (MOBIC) 15 MG tablet Take 1 tablet (15 mg total) by mouth daily. With food 04/29/15   Tonia Ghent, MD  ondansetron (ZOFRAN) 8 MG tablet Take 1 tablet (8 mg total) by mouth every 8 (eight) hours as needed for nausea or vomiting. 04/24/15   Orlie Dakin, MD  pantoprazole (PROTONIX) 40 MG tablet TAKE ONE TABLET BY MOUTH TWICE DAILY 04/29/15   Tonia Ghent, MD  polyethylene glycol powder (GLYCOLAX/MIRALAX) powder MIX 17G IN 4 TO 8 OUNCES OF FLUID AND TAKE TWICE DAILY 02/17/15   Tonia Ghent, MD  potassium chloride (K-DUR) 10 MEQ tablet Take 10 mEq by mouth 2 (two) times daily.    Historical Provider, MD  tamsulosin (FLOMAX) 0.4 MG CAPS capsule Take 1 capsule (0.4 mg total) by mouth daily as needed. 05/20/15   Tonia Ghent, MD  zolpidem (AMBIEN) 10 MG tablet Take 1 tablet (10 mg total) by mouth at bedtime. Fill on/after 12/17/14 03/12/15   Tonia Ghent, MD   BP 127/81 mmHg  Pulse 82  Temp(Src) 98.4 F (36.9 C) (Oral)  Resp 20  Ht 6' (1.829 m)  Wt 310 lb (140.615 kg)  BMI 42.03 kg/m2  SpO2 100% Physical Exam  Constitutional: He is oriented to person, place, and time. He appears well-developed and well-nourished. No distress.  Comfortable-appearing  HENT:  Head: Normocephalic and atraumatic.  Mouth/Throat: Oropharynx is clear and moist.  Eyes: EOM are normal. Pupils are equal, round, and reactive to light.  Neck: Normal range of motion. Neck supple.  Cardiovascular: Normal rate and regular rhythm.   Pulmonary/Chest: Effort normal and breath sounds normal. No respiratory distress. He has no wheezes. He has no rales.  Abdominal: Soft. Bowel sounds are normal. He exhibits no distension and no mass. There is  tenderness (mild right lower quadrant tenderness with palpation. No rebound or guarding.). There is no rebound and no guarding.  Musculoskeletal: Normal range of motion. He exhibits tenderness. He exhibits no edema.  Mild right CVA tenderness with percussion. No lower extremity swelling or asymmetry. Distal pulses are equal and intact.  Neurological: He is alert and oriented to person, place, and time.  Moves all extremities without deficit. Sensation is fully intact.  Skin: Skin is warm and dry. No rash noted. No erythema.  Psychiatric: He has a normal mood and affect. His behavior is normal.  Nursing note and vitals reviewed.   ED Course  Procedures   DIAGNOSTIC STUDIES: Oxygen Saturation is 100% on room air, normal by my interpretation.  COORDINATION OF CARE: 6:09 PM Will order urinalysis. Discussed treatment plan with pt at bedside and pt agreed to plan.   Labs Review Labs Reviewed  URINALYSIS, ROUTINE W REFLEX MICROSCOPIC (NOT AT [facility])    Imaging Review No results found. I have personally reviewed and evaluated these images and lab results as part of my medical decision-making.   EKG Interpretation None      MDM   Final diagnoses:  Renal colic on right side    I personally performed the services described in this documentation, which was scribed in my presence. The recorded information has been reviewed and is accurate.   Discussed results of imaging. Patient's pain has improved after treatment. Encouraged to follow-up with his urologist. Return precautions given.   Julianne Rice, MD 05/25/15 2148

## 2015-05-18 NOTE — ED Notes (Signed)
Right groin pain with radiation into his right flank x 2 days.

## 2015-05-18 NOTE — Discharge Instructions (Signed)
Kidney Stones °Kidney stones (urolithiasis) are deposits that form inside your kidneys. The intense pain is caused by the stone moving through the urinary tract. When the stone moves, the ureter goes into spasm around the stone. The stone is usually passed in the urine.  °CAUSES  °· A disorder that makes certain neck glands produce too much parathyroid hormone (primary hyperparathyroidism). °· A buildup of uric acid crystals, similar to gout in your joints. °· Narrowing (stricture) of the ureter. °· A kidney obstruction present at birth (congenital obstruction). °· Previous surgery on the kidney or ureters. °· Numerous kidney infections. °SYMPTOMS  °· Feeling sick to your stomach (nauseous). °· Throwing up (vomiting). °· Blood in the urine (hematuria). °· Pain that usually spreads (radiates) to the groin. °· Frequency or urgency of urination. °DIAGNOSIS  °· Taking a history and physical exam. °· Blood or urine tests. °· CT scan. °· Occasionally, an examination of the inside of the urinary bladder (cystoscopy) is performed. °TREATMENT  °· Observation. °· Increasing your fluid intake. °· Extracorporeal shock wave lithotripsy--This is a noninvasive procedure that uses shock waves to break up kidney stones. °· Surgery may be needed if you have severe pain or persistent obstruction. There are various surgical procedures. Most of the procedures are performed with the use of small instruments. Only small incisions are needed to accommodate these instruments, so recovery time is minimized. °The size, location, and chemical composition are all important variables that will determine the proper choice of action for you. Talk to your health care provider to better understand your situation so that you will minimize the risk of injury to yourself and your kidney.  °HOME CARE INSTRUCTIONS  °· Drink enough water and fluids to keep your urine clear or pale yellow. This will help you to pass the stone or stone fragments. °· Strain  all urine through the provided strainer. Keep all particulate matter and stones for your health care provider to see. The stone causing the pain may be as small as a grain of salt. It is very important to use the strainer each and every time you pass your urine. The collection of your stone will allow your health care provider to analyze it and verify that a stone has actually passed. The stone analysis will often identify what you can do to reduce the incidence of recurrences. °· Only take over-the-counter or prescription medicines for pain, discomfort, or fever as directed by your health care provider. °· Keep all follow-up visits as told by your health care provider. This is important. °· Get follow-up X-rays if required. The absence of pain does not always mean that the stone has passed. It may have only stopped moving. If the urine remains completely obstructed, it can cause loss of kidney function or even complete destruction of the kidney. It is your responsibility to make sure X-rays and follow-ups are completed. Ultrasounds of the kidney can show blockages and the status of the kidney. Ultrasounds are not associated with any radiation and can be performed easily in a matter of minutes. °· Make changes to your daily diet as told by your health care provider. You may be told to: °¨ Limit the amount of salt that you eat. °¨ Eat 5 or more servings of fruits and vegetables each day. °¨ Limit the amount of meat, poultry, fish, and eggs that you eat. °· Collect a 24-hour urine sample as told by your health care provider. You may need to collect another urine sample every 6-12   months. °SEEK MEDICAL CARE IF: °· You experience pain that is progressive and unresponsive to any pain medicine you have been prescribed. °SEEK IMMEDIATE MEDICAL CARE IF:  °· Pain cannot be controlled with the prescribed medicine. °· You have a fever or shaking chills. °· The severity or intensity of pain increases over 18 hours and is not  relieved by pain medicine. °· You develop a new onset of abdominal pain. °· You feel faint or pass out. °· You are unable to urinate. °  °This information is not intended to replace advice given to you by your health care provider. Make sure you discuss any questions you have with your health care provider. °  °Document Released: 01/10/2005 Document Revised: 10/01/2014 Document Reviewed: 06/13/2012 °Elsevier Interactive Patient Education ©2016 [employer] ° °

## 2015-05-20 ENCOUNTER — Other Ambulatory Visit: Payer: Self-pay | Admitting: Family Medicine

## 2015-05-20 ENCOUNTER — Emergency Department (HOSPITAL_BASED_OUTPATIENT_CLINIC_OR_DEPARTMENT_OTHER)
Admission: EM | Admit: 2015-05-20 | Discharge: 2015-05-20 | Disposition: A | Discharge status: Home / Self Care | Payer: No Typology Code available for payment source | Attending: Emergency Medicine | Admitting: Emergency Medicine

## 2015-05-20 ENCOUNTER — Encounter (HOSPITAL_BASED_OUTPATIENT_CLINIC_OR_DEPARTMENT_OTHER): Payer: Self-pay | Admitting: *Deleted

## 2015-05-20 DIAGNOSIS — F329 Major depressive disorder, single episode, unspecified: Secondary | ICD-10-CM | POA: Insufficient documentation

## 2015-05-20 DIAGNOSIS — R1030 Lower abdominal pain, unspecified: Secondary | ICD-10-CM

## 2015-05-20 DIAGNOSIS — R112 Nausea with vomiting, unspecified: Secondary | ICD-10-CM | POA: Insufficient documentation

## 2015-05-20 DIAGNOSIS — N23 Unspecified renal colic: Secondary | ICD-10-CM

## 2015-05-20 LAB — CBC WITH DIFFERENTIAL/PLATELET
Basophils Absolute: 0 10*3/uL (ref 0.0–0.1)
Basophils Relative: 0 %
Eosinophils Absolute: 0.2 10*3/uL (ref 0.0–0.7)
Eosinophils Relative: 2 %
HCT: 46.3 % (ref 39.0–52.0)
Hemoglobin: 16.3 g/dL (ref 13.0–17.0)
Lymphocytes Relative: 26 %
Lymphs Abs: 2.5 10*3/uL (ref 0.7–4.0)
MCH: 30.7 pg (ref 26.0–34.0)
MCHC: 35.2 g/dL (ref 30.0–36.0)
MCV: 87.2 fL (ref 78.0–100.0)
Monocytes Absolute: 1 10*3/uL (ref 0.1–1.0)
Monocytes Relative: 10 %
Neutro Abs: 5.8 10*3/uL (ref 1.7–7.7)
Neutrophils Relative %: 62 %
Platelets: 320 10*3/uL (ref 150–400)
RBC: 5.31 MIL/uL (ref 4.22–5.81)
RDW: 13.4 % (ref 11.5–15.5)
WBC: 9.5 10*3/uL (ref 4.0–10.5)

## 2015-05-20 LAB — URINALYSIS, ROUTINE W REFLEX MICROSCOPIC
Bilirubin Urine: NEGATIVE
Glucose, UA: NEGATIVE mg/dL
Ketones, ur: NEGATIVE mg/dL
Leukocytes, UA: NEGATIVE
Nitrite: NEGATIVE
Protein, ur: NEGATIVE mg/dL
Specific Gravity, Urine: 1.018 (ref 1.005–1.030)
pH: 7.5 (ref 5.0–8.0)

## 2015-05-20 LAB — URINE MICROSCOPIC-ADD ON

## 2015-05-20 LAB — BASIC METABOLIC PANEL
Anion gap: 4 — ABNORMAL LOW (ref 5–15)
BUN: 14 mg/dL (ref 6–20)
CO2: 26 mmol/L (ref 22–32)
Calcium: 8.9 mg/dL (ref 8.9–10.3)
Chloride: 107 mmol/L (ref 101–111)
Creatinine, Ser: 0.81 mg/dL (ref 0.61–1.24)
GFR calc Af Amer: >60 mL/min (ref >60)
GFR calc non Af Amer: >60 mL/min (ref >60)
Glucose, Bld: 99 mg/dL (ref 65–99)
Potassium: 4 mmol/L (ref 3.5–5.1)
Sodium: 137 mmol/L (ref 135–145)

## 2015-05-20 MED ORDER — SODIUM CHLORIDE 0.9 % IV BOLUS (SEPSIS)
1000.0000 mL | Freq: Once | INTRAVENOUS | Status: AC
  Administered 2015-05-20: 1000 mL via INTRAVENOUS

## 2015-05-20 MED ORDER — KETOROLAC TROMETHAMINE 30 MG/ML IJ SOLN
INTRAMUSCULAR | Status: AC
  Administered 2015-05-20: 30 mg via INTRAVENOUS
  Filled 2015-05-20: qty 1

## 2015-05-20 MED ORDER — DULOXETINE HCL 30 MG PO CPEP: 60.0000 mg | ORAL_CAPSULE | Freq: Every day | ORAL | Status: DC

## 2015-05-20 MED ORDER — TAMSULOSIN HCL 0.4 MG PO CAPS: 0.4000 mg | ORAL_CAPSULE | Freq: Every day | ORAL | Status: DC | PRN

## 2015-05-20 MED ORDER — HYDROMORPHONE HCL 1 MG/ML IJ SOLN
1.0000 mg | Freq: Once | INTRAMUSCULAR | Status: AC
  Administered 2015-05-20: 1 mg via INTRAVENOUS
  Filled 2015-05-20: qty 1

## 2015-05-20 MED ORDER — PROCHLORPERAZINE EDISYLATE 5 MG/ML IJ SOLN
5.0000 mg | Freq: Once | INTRAMUSCULAR | Status: AC
  Administered 2015-05-20: 5 mg via INTRAVENOUS
  Filled 2015-05-20: qty 2

## 2015-05-20 MED ORDER — KETOROLAC TROMETHAMINE 30 MG/ML IJ SOLN
30.0000 mg | Freq: Once | INTRAMUSCULAR | Status: AC
  Administered 2015-05-20: 30 mg via INTRAVENOUS

## 2015-05-20 NOTE — ED Notes (Addendum)
Pt c/o bil flank pain x 3 days, seen here 3 days ago DX kidney stones Pt is seen by Pain management and pt drive self today

## 2015-05-20 NOTE — Progress Notes (Signed)
Pt requested rx for flomax.  This is reasonable.  Done, printed.  Given to patient.

## 2015-05-20 NOTE — Discharge Instructions (Signed)
Your labs are negative for acute infection, or acute renal issues. Your urine tests are negative for acute infection. Please see your urology specialist as scheduled. You were treated with intravenous necrotic pain medication, please use caution getting around this evening. Renal Colic Renal colic is pain that is caused by passing a kidney stone. The pain can be sharp and severe. It may be felt in the back, abdomen, side (flank), or groin. It can cause nausea. Renal colic can come and go. HOME CARE INSTRUCTIONS Watch your condition for any changes. The following actions may help to lessen any discomfort that you are feeling:  Take medicines only as directed by your health care provider.  Ask your health care provider if it is okay to take over-the-counter pain medicine.  Drink enough fluid to keep your urine clear or pale yellow. Drink 6-8 glasses of water each day.  Limit the amount of salt that you eat to less than 2 grams per day.  Reduce the amount of protein in your diet. Eat less meat, fish, nuts, and dairy.  Avoid foods such as spinach, rhubarb, nuts, or bran. These may make kidney stones more likely to form. SEEK MEDICAL CARE IF:  You have a fever or chills.  Your urine smells bad or looks cloudy.  You have pain or burning when you pass urine. SEEK IMMEDIATE MEDICAL CARE IF:  Your flank pain or groin pain suddenly worsens.  You become confused or disoriented or you lose consciousness.   This information is not intended to replace advice given to you by your health care provider. Make sure you discuss any questions you have with your health care provider.   Document Released: 10/20/2004 Document Revised: 01/31/2014 Document Reviewed: 11/20/2013 Elsevier Interactive Patient Education [employer].

## 2015-05-20 NOTE — ED Notes (Signed)
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

## 2015-05-20 NOTE — ED Provider Notes (Signed)
CSN: [id number]     Arrival date & time 05/20/15  1521 History   First MD Initiated Contact with Patient 05/20/15 1603     Chief Complaint  Patient presents with  . Flank Pain     (Consider location/radiation/quality/duration/timing/severity/associated sxs/prior Treatment) HPI Comments: Pt has hx of renal calculi. He was seen in April 24 and again today because of bilateral flank area pain. The patient is being seen by the urology team at [facility]. He noted an acute flareup earlier today while running in urine. This doubled him over in pain, and cause nausea. He has pain that radiates into the right testicle area. He states that his urine looks very very dark. He has not had any high fever. Has not had actual vomiting. His been no injury or trauma to the flank areas. It is of note that the patient required lithotripsy in the recent past.   Patient is a 35 y.o. male presenting with flank pain. The history is provided by the patient.  Flank Pain This is a recurrent problem. The current episode started in the past 7 days. Episode frequency: constant in the past 2-3 hours. The problem has been gradually worsening. Associated symptoms include nausea. Associated symptoms comments: bilat flank pain.. Nothing aggravates the symptoms. He has tried nothing for the symptoms. The treatment provided no relief.    Past Medical History  Diagnosis Date  . GERD (gastroesophageal reflux disease)   . History of kidney stones   . Seizures ([place]) Childhood  . Depression     and panic/anxiety  . Insomnia   . Kidney stones    Past Surgical History  Procedure Laterality Date  . Lithotripsy      four times over the years; most recent 10/08/12  . Appendectomy  2011  . Laparoscopic cholecystectomy  1996  . Tonsillectomy  1992  . Wrist ganglion excision  2002    Right  . Hydrocele excision Left 06/2012  . Orchiectomy Left 11/2012  . Spinal cord stimulator insertion  2015   Family History  Problem  Relation Age of Onset  . Stroke Mother   . Liver disease Mother   . Irritable bowel syndrome Mother   . Kidney disease Mother   . Heart disease Father   . Colon polyps Father   . Colon cancer Neg Hx    Social History  Substance Use Topics  . Smoking status: Never Smoker   . Smokeless tobacco: Never Used  . Alcohol Use: 0.0 oz/week    0 Standard drinks or equivalent per week     Comment: rarely    Review of Systems  Gastrointestinal: Positive for nausea.       Groin pain  Genitourinary: Positive for flank pain.  Musculoskeletal: Positive for back pain.  All other systems reviewed and are negative.     Allergies  Tape; Trileptal; and Wellbutrin  Home Medications   Prior to Admission medications   Medication Sig Start Date End Date Taking? Authorizing Provider  docusate sodium (COLACE) 100 MG capsule Take 1 capsule (100 mg total) by mouth 2 (two) times daily. 09/26/13   Tonia Ghent, MD  DULoxetine (CYMBALTA) 30 MG capsule Take 2 capsules (60 mg total) by mouth daily. 05/20/15   Tonia Ghent, MD  escitalopram (LEXAPRO) 20 MG tablet TAKE 2 TABLETS BY MOUTH DAILY 02/25/15   Tonia Ghent, MD  HYDROcodone-acetaminophen [facility]) 10-325 MG tablet Take 1 tablet by mouth every 6 (six) hours as needed. 05/17/15  Tonia Ghent, MD  HYDROmorphone (DILAUDID) 2 MG tablet Take 1 tablet (2 mg total) by mouth every 4 (four) hours as needed for severe pain (for kidney stones). Sedation caution.  Call if questions. 05/17/15   Tonia Ghent, MD  ibuprofen (ADVIL,MOTRIN) 200 MG tablet Take 800 mg by mouth every 6 (six) hours as needed for mild pain or moderate pain (chronic groin & kidney pain).     Historical Provider, MD  indapamide (LOZOL) 2.5 MG tablet Take 2.5 mg by mouth every morning.     Historical Provider, MD  meloxicam (MOBIC) 15 MG tablet Take 1 tablet (15 mg total) by mouth daily. With food 04/29/15   Tonia Ghent, MD  ondansetron (ZOFRAN) 8 MG tablet Take 1 tablet (8 mg  total) by mouth every 8 (eight) hours as needed for nausea or vomiting. 04/24/15   Orlie Dakin, MD  pantoprazole (PROTONIX) 40 MG tablet TAKE ONE TABLET BY MOUTH TWICE DAILY 04/29/15   Tonia Ghent, MD  polyethylene glycol powder (GLYCOLAX/MIRALAX) powder MIX 17G IN 4 TO 8 OUNCES OF FLUID AND TAKE TWICE DAILY 02/17/15   Tonia Ghent, MD  potassium chloride (K-DUR) 10 MEQ tablet Take 10 mEq by mouth 2 (two) times daily.    Historical Provider, MD  tamsulosin (FLOMAX) 0.4 MG CAPS capsule Take 1 capsule (0.4 mg total) by mouth daily as needed. 05/20/15   Tonia Ghent, MD  zolpidem (AMBIEN) 10 MG tablet Take 1 tablet (10 mg total) by mouth at bedtime. Fill on/after 12/17/14 03/12/15   Tonia Ghent, MD   BP 130/90 mmHg  Pulse 76  Temp(Src) 98 F (36.7 C)  Resp 16  Ht 6' (1.829 m)  Wt 140.615 kg  BMI 42.03 kg/m2  SpO2 100% Physical Exam  Constitutional: He is oriented to person, place, and time. He appears well-developed and well-nourished.  Non-toxic appearance. He appears distressed.  HENT:  Head: Normocephalic.  Right Ear: Tympanic membrane and external ear normal.  Left Ear: Tympanic membrane and external ear normal.  Eyes: EOM and lids are normal. Pupils are equal, round, and reactive to light.  Neck: Normal range of motion. Neck supple. Carotid bruit is not present.  Cardiovascular: Normal rate, regular rhythm, normal heart sounds, intact distal pulses and normal pulses.   Pulmonary/Chest: Breath sounds normal. No respiratory distress.  Abdominal: Soft. Bowel sounds are normal. There is no tenderness. There is no guarding.  Bilat flank pain. Pt writhing in pain.  Musculoskeletal: Normal range of motion.  Lymphadenopathy:       Head (right side): No submandibular adenopathy present.       Head (left side): No submandibular adenopathy present.    He has no cervical adenopathy.  Neurological: He is alert and oriented to person, place, and time. He has normal strength. No  cranial nerve deficit or sensory deficit.  Skin: Skin is warm and dry.  Psychiatric: He has a normal mood and affect. His speech is normal.  Nursing note and vitals reviewed.   ED Course  Procedures (including critical care time) Labs Review Labs Reviewed  URINALYSIS, ROUTINE W REFLEX MICROSCOPIC (NOT AT [facility])  CBC WITH DIFFERENTIAL/PLATELET  BASIC METABOLIC PANEL    Imaging Review Ct Renal Stone Study  05/18/2015  CLINICAL DATA:  Right flank pain with nausea for 2 days. History of kidney stones, appendectomy and cholecystectomy. EXAM: CT ABDOMEN AND PELVIS WITHOUT CONTRAST TECHNIQUE: Multidetector CT imaging of the abdomen and pelvis was performed following the standard  protocol without IV contrast. COMPARISON:  Abdominal pelvic CT 04/02/2015. FINDINGS: Lower chest: Stable atelectasis or scarring in the lingula. The lung bases are otherwise clear. There is no pleural or pericardial effusion. Hepatobiliary: Severe, geographic hepatic steatosis. No focal abnormalities are seen. No significant biliary dilatation status post cholecystectomy. Pancreas: Unremarkable. No pancreatic ductal dilatation or surrounding inflammatory changes. Spleen: Normal in size without focal abnormality. Adrenals/Urinary Tract: Both adrenal glands appear normal. There is a stable 2 mm calculus in the upper pole of the left kidney, best seen on the reformatted images. Right-sided hydronephrosis and perinephric soft tissue stranding have improved. However, there is a persistent or recurrent 4 mm calculus in the mid right ureter (image 60). The reformatted images demonstrate 2 calculi at this level which is just below the L3-4 disc space and the same location as the calculus seen on the recent CT. The distal right ureteral calculus is no longer seen. There is no evidence of bladder calculus. Stomach/Bowel: No evidence of bowel wall thickening, distention or surrounding inflammatory change. Mild sigmoid diverticulosis.  Vascular/Lymphatic: There are no enlarged abdominal or pelvic lymph nodes. No significant vascular findings on noncontrast imaging. Reproductive: Unremarkable. Other: Stable small umbilical hernia containing only fat. Spinal stimulator appears grossly unchanged. Musculoskeletal: No acute or significant osseous findings. IMPRESSION: 1. Persistent or recurrent mid right ureteral calculi. There are 2 mid ureteral calculi demonstrated on the current study, with resulting less secondary signs of ureteral obstruction compared with the prior study from 7 weeks ago. 2. Stable nonobstructing calculus in the upper pole of the left kidney. 3. Severe hepatic steatosis. Electronically Signed   By: Richardean Sale M.D.   On: 05/18/2015 19:15   I have personally reviewed and evaluated these images and lab results as part of my medical decision-making.   EKG Interpretation None      MDM Vital signs are well within normal limits. The pulse oximetry is 100% on room air. Urinalysis reveals moderate hemoglobin and to many to count red blood cells per high-powered field. No other significant abnormality. A complete blood count was conducted to rule out infected stone, and it shows no acute abnormality. A basic metabolic panel was conducted to rule out renal involvement of, as well as to monitor the patient's glucose, and these are well within normal limits.  Patient reports only partial relief from IV Dilaudid. I have reviewed the patient's CT scan of the abdomen and pelvis on April 24. The patient has right mid ureteral calculi present 2. The patient will be treated with intravenous Toradol.  6:21pm Pt states the pain is a little better than before but still about a 6/10, and still radiating to the right testicle area. IV dilaudid given.   7:22 PM patient states that he feels that his pain is at a level that he can manage at home. He is resting comfortably, and conversing with his visitor. The patient is discharged  home. He has medications through a contract with his primary physician for another medical problem. He has an appointment scheduled with his urology specialist.    Final diagnoses:  Renal colic    **I have reviewed nursing notes, vital signs, and all appropriate lab and imaging results for this patient.Lily Kocher, PA-C 05/20/15 [place], MD 05/20/15 [phone]

## 2015-05-30 ENCOUNTER — Emergency Department (HOSPITAL_BASED_OUTPATIENT_CLINIC_OR_DEPARTMENT_OTHER): Payer: Self-pay

## 2015-05-30 ENCOUNTER — Emergency Department (HOSPITAL_BASED_OUTPATIENT_CLINIC_OR_DEPARTMENT_OTHER)
Admission: EM | Admit: 2015-05-30 | Discharge: 2015-05-30 | Disposition: A | Discharge status: Home / Self Care | Payer: Self-pay | Attending: Emergency Medicine | Admitting: Emergency Medicine

## 2015-05-30 ENCOUNTER — Encounter (HOSPITAL_BASED_OUTPATIENT_CLINIC_OR_DEPARTMENT_OTHER): Payer: Self-pay | Admitting: *Deleted

## 2015-05-30 DIAGNOSIS — N201 Calculus of ureter: Secondary | ICD-10-CM | POA: Insufficient documentation

## 2015-05-30 DIAGNOSIS — F329 Major depressive disorder, single episode, unspecified: Secondary | ICD-10-CM | POA: Insufficient documentation

## 2015-05-30 DIAGNOSIS — N23 Unspecified renal colic: Secondary | ICD-10-CM | POA: Insufficient documentation

## 2015-05-30 LAB — COMPREHENSIVE METABOLIC PANEL
ALT: 51 U/L (ref 17–63)
AST: 38 U/L (ref 15–41)
Albumin: 4.6 g/dL (ref 3.5–5.0)
Alkaline Phosphatase: 60 U/L (ref 38–126)
Anion gap: 9 (ref 5–15)
BUN: 11 mg/dL (ref 6–20)
CO2: 21 mmol/L — ABNORMAL LOW (ref 22–32)
Calcium: 9.3 mg/dL (ref 8.9–10.3)
Chloride: 106 mmol/L (ref 101–111)
Creatinine, Ser: 0.95 mg/dL (ref 0.61–1.24)
GFR calc Af Amer: >60 mL/min (ref >60)
GFR calc non Af Amer: >60 mL/min (ref >60)
Glucose, Bld: 129 mg/dL — ABNORMAL HIGH (ref 65–99)
Potassium: 3.9 mmol/L (ref 3.5–5.1)
Sodium: 136 mmol/L (ref 135–145)
Total Bilirubin: 1 mg/dL (ref 0.3–1.2)
Total Protein: 7.7 g/dL (ref 6.5–8.1)

## 2015-05-30 LAB — URINALYSIS, ROUTINE W REFLEX MICROSCOPIC
Bilirubin Urine: NEGATIVE
Glucose, UA: NEGATIVE mg/dL
Ketones, ur: 15 mg/dL — AB
Leukocytes, UA: NEGATIVE
Nitrite: NEGATIVE
Protein, ur: 30 mg/dL — AB
Specific Gravity, Urine: 1.02 (ref 1.005–1.030)
pH: 7.5 (ref 5.0–8.0)

## 2015-05-30 LAB — CBC WITH DIFFERENTIAL/PLATELET
Basophils Absolute: 0 10*3/uL (ref 0.0–0.1)
Basophils Relative: 0 %
Eosinophils Absolute: 0.1 10*3/uL (ref 0.0–0.7)
Eosinophils Relative: 1 %
HCT: 45.5 % (ref 39.0–52.0)
Hemoglobin: 16.8 g/dL (ref 13.0–17.0)
Lymphocytes Relative: 21 %
Lymphs Abs: 2 10*3/uL (ref 0.7–4.0)
MCH: 31.3 pg (ref 26.0–34.0)
MCHC: 36.9 g/dL — ABNORMAL HIGH (ref 30.0–36.0)
MCV: 84.7 fL (ref 78.0–100.0)
Monocytes Absolute: 0.9 10*3/uL (ref 0.1–1.0)
Monocytes Relative: 9 %
Neutro Abs: 6.8 10*3/uL (ref 1.7–7.7)
Neutrophils Relative %: 68 %
Platelets: 335 10*3/uL (ref 150–400)
RBC: 5.37 MIL/uL (ref 4.22–5.81)
RDW: 13.4 % (ref 11.5–15.5)
WBC: 9.9 10*3/uL (ref 4.0–10.5)

## 2015-05-30 LAB — URINE MICROSCOPIC-ADD ON
Squamous Epithelial / LPF: NONE SEEN
WBC, UA: NONE SEEN WBC/hpf (ref 0–5)

## 2015-05-30 MED ORDER — HYDROMORPHONE HCL 1 MG/ML IJ SOLN
1.0000 mg | Freq: Once | INTRAMUSCULAR | Status: AC
  Administered 2015-05-30: 1 mg via INTRAVENOUS
  Filled 2015-05-30: qty 1

## 2015-05-30 MED ORDER — ONDANSETRON HCL 4 MG/2ML IJ SOLN
4.0000 mg | Freq: Once | INTRAMUSCULAR | Status: AC
  Administered 2015-05-30: 4 mg via INTRAVENOUS
  Filled 2015-05-30: qty 2

## 2015-05-30 MED ORDER — ONDANSETRON HCL 4 MG PO TABS: 4.0000 mg | ORAL_TABLET | Freq: Four times a day (QID) | ORAL | Status: DC

## 2015-05-30 MED ORDER — KETOROLAC TROMETHAMINE 10 MG PO TABS: 10.0000 mg | ORAL_TABLET | Freq: Three times a day (TID) | ORAL | Status: DC | PRN

## 2015-05-30 MED ORDER — KETOROLAC TROMETHAMINE 30 MG/ML IJ SOLN
30.0000 mg | Freq: Once | INTRAMUSCULAR | Status: AC
  Administered 2015-05-30: 30 mg via INTRAVENOUS
  Filled 2015-05-30: qty 1

## 2015-05-30 MED ORDER — HYDROMORPHONE HCL 1 MG/ML IJ SOLN
1.0000 mg | Freq: Once | INTRAMUSCULAR | Status: AC
Start: 2015-05-30 — End: 2015-05-30
  Administered 2015-05-30: 1 mg via INTRAVENOUS
  Filled 2015-05-30: qty 1

## 2015-05-30 MED ORDER — SODIUM CHLORIDE 0.9 % IV BOLUS (SEPSIS)
1000.0000 mL | Freq: Once | INTRAVENOUS | Status: AC
Start: 2015-05-30 — End: 2015-05-30
  Administered 2015-05-30: 1000 mL via INTRAVENOUS

## 2015-05-30 NOTE — Discharge Instructions (Signed)
You need to follow-up with your urologist regarding potential stent placement given that you have your kidney stone for so long. Return for worsening symptoms, including fever, vomiting and unable to keep down food/fluids, or any other symptoms concerning to you.  Kidney Stones Kidney stones (urolithiasis) are deposits that form inside your kidneys. The intense pain is caused by the stone moving through the urinary tract. When the stone moves, the ureter goes into spasm around the stone. The stone is usually passed in the urine.  CAUSES   A disorder that makes certain neck glands produce too much parathyroid hormone (primary hyperparathyroidism).  A buildup of uric acid crystals, similar to gout in your joints.  Narrowing (stricture) of the ureter.  A kidney obstruction present at birth (congenital obstruction).  Previous surgery on the kidney or ureters.  Numerous kidney infections. SYMPTOMS   Feeling sick to your stomach (nauseous).  Throwing up (vomiting).  Blood in the urine (hematuria).  Pain that usually spreads (radiates) to the groin.  Frequency or urgency of urination. DIAGNOSIS   Taking a history and physical exam.  Blood or urine tests.  CT scan.  Occasionally, an examination of the inside of the urinary bladder (cystoscopy) is performed. TREATMENT   Observation.  Increasing your fluid intake.  Extracorporeal shock wave lithotripsy--This is a noninvasive procedure that uses shock waves to break up kidney stones.  Surgery may be needed if you have severe pain or persistent obstruction. There are various surgical procedures. Most of the procedures are performed with the use of small instruments. Only small incisions are needed to accommodate these instruments, so recovery time is minimized. The size, location, and chemical composition are all important variables that will determine the proper choice of action for you. Talk to your health care provider to better  understand your situation so that you will minimize the risk of injury to yourself and your kidney.  HOME CARE INSTRUCTIONS   Drink enough water and fluids to keep your urine clear or pale yellow. This will help you to pass the stone or stone fragments.  Strain all urine through the provided strainer. Keep all particulate matter and stones for your health care provider to see. The stone causing the pain may be as small as a grain of salt. It is very important to use the strainer each and every time you pass your urine. The collection of your stone will allow your health care provider to analyze it and verify that a stone has actually passed. The stone analysis will often identify what you can do to reduce the incidence of recurrences.  Only take over-the-counter or prescription medicines for pain, discomfort, or fever as directed by your health care provider.  Keep all follow-up visits as told by your health care provider. This is important.  Get follow-up X-rays if required. The absence of pain does not always mean that the stone has passed. It may have only stopped moving. If the urine remains completely obstructed, it can cause loss of kidney function or even complete destruction of the kidney. It is your responsibility to make sure X-rays and follow-ups are completed. Ultrasounds of the kidney can show blockages and the status of the kidney. Ultrasounds are not associated with any radiation and can be performed easily in a matter of minutes.  Make changes to your daily diet as told by your health care provider. You may be told to:  Limit the amount of salt that you eat.  Eat 5 or more  servings of fruits and vegetables each day.  Limit the amount of meat, poultry, fish, and eggs that you eat.  Collect a 24-hour urine sample as told by your health care provider.You may need to collect another urine sample every 6-12 months. SEEK MEDICAL CARE IF:  You experience pain that is progressive and  unresponsive to any pain medicine you have been prescribed. SEEK IMMEDIATE MEDICAL CARE IF:   Pain cannot be controlled with the prescribed medicine.  You have a fever or shaking chills.  The severity or intensity of pain increases over 18 hours and is not relieved by pain medicine.  You develop a new onset of abdominal pain.  You feel faint or pass out.  You are unable to urinate.   This information is not intended to replace advice given to you by your health care provider. Make sure you discuss any questions you have with your health care provider.   Document Released: 01/10/2005 Document Revised: 10/01/2014 Document Reviewed: 06/13/2012 Elsevier Interactive Patient Education [employer].

## 2015-05-30 NOTE — ED Provider Notes (Signed)
CSN: [id number]     Arrival date & time 05/30/15  1114 History   First MD Initiated Contact with Patient 05/30/15 1127     Chief Complaint  Patient presents with  . Flank Pain     (Consider location/radiation/quality/duration/timing/severity/associated sxs/prior Treatment) HPI 35 year old male who presents with right-sided flank pain. He has a history of chronic low back pain, nephrolithiasis, appendectomy, cholecystectomy, and orchiectomy. States that he has had right-sided ureteral stones diagnosed in early March which she has not passed. Takes Vicodin for pain control, and states that he has not been able to follow-up with his urologist at [facility] as he cannot afford the co-pay and had to reschedule his appointment. He has subsequently been to the emergency department since 04/02/2015 for management of acute pain. Since then, he has had 2 CT renal stone study into renal ultrasound. States that he has been at his baseline pain up until this morning when he had nausea, severe right flank pain that radiated to the right lower quadrant and right scrotum. No fevers or chills. States that this is consistent with his prior kidney stone pain  Past Medical History  Diagnosis Date  . GERD (gastroesophageal reflux disease)   . History of kidney stones   . Seizures ([place]) Childhood  . Depression     and panic/anxiety  . Insomnia   . Kidney stones    Past Surgical History  Procedure Laterality Date  . Lithotripsy      four times over the years; most recent 10/08/12  . Appendectomy  2011  . Laparoscopic cholecystectomy  1996  . Tonsillectomy  1992  . Wrist ganglion excision  2002    Right  . Hydrocele excision Left 06/2012  . Orchiectomy Left 11/2012  . Spinal cord stimulator insertion  2015   Family History  Problem Relation Age of Onset  . Stroke Mother   . Liver disease Mother   . Irritable bowel syndrome Mother   . Kidney disease Mother   . Heart disease Father   . Colon polyps  Father   . Colon cancer Neg Hx    Social History  Substance Use Topics  . Smoking status: Never Smoker   . Smokeless tobacco: Never Used  . Alcohol Use: 0.0 oz/week    0 Standard drinks or equivalent per week     Comment: rarely    Review of Systems 10/14 systems reviewed and are negative other than those stated in the HPI    Allergies  Tape; Trileptal; and Wellbutrin  Home Medications   Prior to Admission medications   Medication Sig Start Date End Date Taking? Authorizing Provider  docusate sodium (COLACE) 100 MG capsule Take 1 capsule (100 mg total) by mouth 2 (two) times daily. 09/26/13   Tonia Ghent, MD  DULoxetine (CYMBALTA) 30 MG capsule Take 2 capsules (60 mg total) by mouth daily. 05/20/15   Tonia Ghent, MD  escitalopram (LEXAPRO) 20 MG tablet TAKE 2 TABLETS BY MOUTH DAILY 02/25/15   Tonia Ghent, MD  HYDROcodone-acetaminophen [facility]) 10-325 MG tablet Take 1 tablet by mouth every 6 (six) hours as needed. 05/17/15   Tonia Ghent, MD  HYDROmorphone (DILAUDID) 2 MG tablet Take 1 tablet (2 mg total) by mouth every 4 (four) hours as needed for severe pain (for kidney stones). Sedation caution.  Call if questions. 05/17/15   Tonia Ghent, MD  ibuprofen (ADVIL,MOTRIN) 200 MG tablet Take 800 mg by mouth every 6 (six) hours as needed  for mild pain or moderate pain (chronic groin & kidney pain).     Historical Provider, MD  indapamide (LOZOL) 2.5 MG tablet Take 2.5 mg by mouth every morning.     Historical Provider, MD  ketorolac (TORADOL) 10 MG tablet Take 1 tablet (10 mg total) by mouth every 8 (eight) hours as needed for moderate pain or severe pain. 05/30/15   Forde Dandy, MD  meloxicam (MOBIC) 15 MG tablet Take 1 tablet (15 mg total) by mouth daily. With food 04/29/15   Tonia Ghent, MD  ondansetron (ZOFRAN) 4 MG tablet Take 1 tablet (4 mg total) by mouth every 6 (six) hours. 05/30/15   Forde Dandy, MD  ondansetron (ZOFRAN) 8 MG tablet Take 1 tablet (8 mg total) by  mouth every 8 (eight) hours as needed for nausea or vomiting. 04/24/15   Orlie Dakin, MD  pantoprazole (PROTONIX) 40 MG tablet TAKE ONE TABLET BY MOUTH TWICE DAILY 04/29/15   Tonia Ghent, MD  polyethylene glycol powder (GLYCOLAX/MIRALAX) powder MIX 17G IN 4 TO 8 OUNCES OF FLUID AND TAKE TWICE DAILY 02/17/15   Tonia Ghent, MD  potassium chloride (K-DUR) 10 MEQ tablet Take 10 mEq by mouth 2 (two) times daily.    Historical Provider, MD  tamsulosin (FLOMAX) 0.4 MG CAPS capsule Take 1 capsule (0.4 mg total) by mouth daily as needed. 05/20/15   Tonia Ghent, MD  zolpidem (AMBIEN) 10 MG tablet Take 1 tablet (10 mg total) by mouth at bedtime. Fill on/after 12/17/14 03/12/15   Tonia Ghent, MD   BP 143/90 mmHg  Pulse 62  Temp(Src) 98.3 F (36.8 C) (Oral)  Resp 18  Ht 6' (1.829 m)  Wt 310 lb (140.615 kg)  BMI 42.03 kg/m2  SpO2 100% Physical Exam Physical Exam  Nursing note and vitals reviewed. Constitutional: non-toxic, appears uncomfortable secondary to pain Head: Normocephalic and atraumatic.  Mouth/Throat: Oropharynx is clear and moist.  Neck: Normal range of motion. Neck supple.  Cardiovascular: Normal rate and regular rhythm.   Pulmonary/Chest: Effort normal and breath sounds normal.  Abdominal:   Musculoskeletal: Normal range of motion.  Neurological: Alert, no facial droop, fluent speech, moves all extremities symmetrically Skin: Skin is warm and dry.  Psychiatric: Cooperative   ED Course  Procedures (including critical care time) Labs Review Labs Reviewed  CBC WITH DIFFERENTIAL/PLATELET - Abnormal; Notable for the following:    MCHC 36.9 (*)    All other components within normal limits  COMPREHENSIVE METABOLIC PANEL - Abnormal; Notable for the following:    CO2 21 (*)    Glucose, Bld 129 (*)    All other components within normal limits  URINALYSIS, ROUTINE W REFLEX MICROSCOPIC (NOT AT [facility]) - Abnormal; Notable for the following:    Hgb urine dipstick MODERATE  (*)    Ketones, ur 15 (*)    Protein, ur 30 (*)    All other components within normal limits  URINE MICROSCOPIC-ADD ON - Abnormal; Notable for the following:    Bacteria, UA MANY (*)    All other components within normal limits    Imaging Review US Renal  05/30/2015  CLINICAL DATA:  Right flank pain radiating into right inguinal region EXAM: RENAL / URINARY TRACT ULTRASOUND COMPLETE COMPARISON:  CT abdomen and pelvis May 18, 2015 FINDINGS: Right Kidney: Length: 14.9 cm. Echogenicity and renal cortical thickness are within normal limits. No mass or perinephric fluid visualized. There is moderate hydronephrosis and proximal ureterectasis. There is a  calculus in the inferior right kidney measuring 1.1 cm in length. Left Kidney: Length: 13.8 cm. Echogenicity and renal cortical thickness are within normal limits. No mass, perinephric fluid, or hydronephrosis visualized. No sonographically demonstrable calculus or ureterectasis. Bladder: Appears normal for degree of bladder distention. IMPRESSION: Moderate hydronephrosis and proximal ureterectasis on the right. Gas obscures the mid the distal right ureter. There is a nonobstructing calculus in the lower pole of the right kidney. Study otherwise unremarkable. Noncontrast enhanced CT abdomen and pelvis would be the imaging study of choice to assess for calculus in the right ureter. Electronically Signed   By: Lowella Grip III M.D.   On: 05/30/2015 13:05   I have personally reviewed and evaluated these images and lab results as part of my medical decision-making.   EKG Interpretation None      MDM   Final diagnoses:  Ureterolithiasis  Renal colic   35 year old male with history of Nephrolithiasis who presents with persistent right-sided flank pain in the setting of known right ureteral stone over the past few weeks. On presentation he is afebrile with stable vital signs. He has right-sided abdominal pain and right flank tenderness to palpation  and percussion. Unremarkable GU exam. 6 UA shows no signs of infection. He has normal renal function. Ultrasound of his kidneys were performed and he has hydronephrosis of the right ureter suggestive of persistent ureteral stone. Given IV fluids, Toradol, and dilaudid 2.  strict return instructions are reviewed.  the patient at this time does not require emergent or urgent urology evaluation, but did discuss with him close follow-up with his urologist given that a stone has been persistent over several weeks and may require stenting. He expressed understanding of all discharge instructions, and felt comfortable with the plan of care.  Forde Dandy, MD 05/30/15 [phone]

## 2015-05-30 NOTE — ED Notes (Signed)
IVF of NS bolus initiated

## 2015-05-30 NOTE — ED Notes (Signed)
Safety measures in place, cont POX monitoring and NBP remain on.

## 2015-05-30 NOTE — ED Notes (Signed)
Pt states he had some pain relief temp, states now "I need more pain meds, I am back to 75% pain level"

## 2015-05-30 NOTE — ED Notes (Signed)
Upon entering room, pt resting quietly with eyes closed, after placing BP on rt arm, pt began moaning and verbalizing needing more pain med. Pt reassured, informed of current medication orders by EDP

## 2015-05-30 NOTE — ED Notes (Signed)
Pt loudly moaning, restless on bed, kicking knees, facial grimmacing noted, states has rt sided flank pain

## 2015-05-30 NOTE — ED Notes (Signed)
Safety measures in place, sr x 2 up, significant other at bedside, placed on cont POX monitoring with int NBP monitoring

## 2015-05-30 NOTE — ED Notes (Signed)
Pt requesting additional pain medication.  Dr Viviana Simpler aware.  Pt advised that additional test results are needed prior to the administration of additional pain medications.  Family sitting in room with patient now.  Patient in NAD.

## 2015-05-30 NOTE — ED Notes (Signed)
Patient c/o R side flank pain that started last night, but approx one hour ago pain has become unbearable. He took one zofran and 20 mg of vicodin, but it has not helped his pain. He is on pain medication for his back.

## 2015-05-30 NOTE — ED Notes (Signed)
Patient transported to radiology via stretcher, sr x 2 up

## 2015-05-30 NOTE — ED Notes (Signed)
DC instructions reviewed with pt, spec container and strainer provided, stressed importance of making follow up appoint with Urologist. Also reviewed the two (2) Rx's provided by EDP for pt, discussed using analgesia with home meds to aid in pain control, also to use antiemetic to help with taking in PO's. Opportunity for questions provided

## 2015-06-10 ENCOUNTER — Encounter: Payer: Self-pay | Admitting: Family Medicine

## 2015-06-11 ENCOUNTER — Other Ambulatory Visit: Payer: Self-pay | Admitting: Family Medicine

## 2015-06-11 MED ORDER — HYDROCODONE-ACETAMINOPHEN 10-325 MG PO TABS: 1.0000 | ORAL_TABLET | Freq: Four times a day (QID) | ORAL | Status: DC | PRN

## 2015-06-11 MED ORDER — HYDROMORPHONE HCL 2 MG PO TABS: 2.0000 mg | ORAL_TABLET | ORAL | Status: DC | PRN

## 2015-06-11 MED ORDER — ZOLPIDEM TARTRATE 10 MG PO TABS: 10.0000 mg | ORAL_TABLET | Freq: Every day | ORAL | Status: DC

## 2015-06-11 MED ORDER — ESCITALOPRAM OXALATE 20 MG PO TABS: 40.0000 mg | ORAL_TABLET | Freq: Every day | ORAL | Status: DC

## 2015-06-12 ENCOUNTER — Telehealth: Payer: Self-pay | Admitting: *Deleted

## 2015-06-12 NOTE — Telephone Encounter (Signed)
Rx's for Norco, Dilaudid and Zolpidem were printed for patient to pick up Rx's.   Patient called the office and said he won't be able to pick up the Rx's until Monday and asks if the Zolpidem could be phoned in.  Zolpidem phoned to [facility] on [address] in [place].  Please print remaining 2 prescriptions again for patient pickup.  Original sheet of Rx's returned to Dr. Damita Dunnings with X's marked through all 3 Rx's.

## 2015-06-14 MED ORDER — HYDROCODONE-ACETAMINOPHEN 10-325 MG PO TABS: 1.0000 | ORAL_TABLET | Freq: Four times a day (QID) | ORAL | Status: DC | PRN

## 2015-06-14 MED ORDER — HYDROMORPHONE HCL 2 MG PO TABS: 2.0000 mg | ORAL_TABLET | ORAL | Status: DC | PRN

## 2015-06-14 NOTE — Telephone Encounter (Addendum)
Old rxs destroyed.  New rxs printed.  Thanks.

## 2015-06-14 NOTE — Addendum Note (Signed)
Addended by: Tonia Ghent on: 06/14/2015 09:25 PM   Modules accepted: Orders

## 2015-06-15 NOTE — Telephone Encounter (Signed)
Rx left at front desk for pick up.

## 2015-06-30 ENCOUNTER — Telehealth: Payer: Self-pay

## 2015-06-30 NOTE — Telephone Encounter (Signed)
Pt says he is feeling anxious, nervous, and jittery. He usually uses MyChart because he thought it would be quicker to get through. He is not sure if he is having a reaction to his pain meds. Would like some guidance. Please call back.

## 2015-06-30 NOTE — Telephone Encounter (Signed)
Called pt.  LMOVM.  Will send mychart message.  Thanks.

## 2015-07-17 ENCOUNTER — Encounter: Payer: Self-pay | Admitting: Family Medicine

## 2015-07-17 ENCOUNTER — Other Ambulatory Visit: Payer: Self-pay | Admitting: Primary Care

## 2015-07-17 NOTE — Telephone Encounter (Signed)
Last filled 06/14/15--please advise

## 2015-07-19 ENCOUNTER — Other Ambulatory Visit: Payer: Self-pay | Admitting: Family Medicine

## 2015-07-19 MED ORDER — PANTOPRAZOLE SODIUM 40 MG PO TBEC: DELAYED_RELEASE_TABLET | ORAL | Status: DC

## 2015-07-19 MED ORDER — HYDROCODONE-ACETAMINOPHEN 10-325 MG PO TABS: 1.0000 | ORAL_TABLET | Freq: Four times a day (QID) | ORAL | Status: DC | PRN

## 2015-08-06 ENCOUNTER — Encounter: Payer: Self-pay | Admitting: Family Medicine

## 2015-08-07 ENCOUNTER — Other Ambulatory Visit: Payer: Self-pay | Admitting: Family Medicine

## 2015-08-07 MED ORDER — ONDANSETRON 4 MG PO TBDP: 4.0000 mg | ORAL_TABLET | Freq: Three times a day (TID) | ORAL | Status: DC | PRN

## 2015-08-26 ENCOUNTER — Encounter: Payer: Self-pay | Admitting: Family Medicine

## 2015-08-27 ENCOUNTER — Other Ambulatory Visit: Payer: Self-pay | Admitting: Family Medicine

## 2015-08-27 MED ORDER — HYDROMORPHONE HCL 2 MG PO TABS: 2.0000 mg | ORAL_TABLET | ORAL | 0 refills | Status: DC | PRN

## 2015-08-27 MED ORDER — HYDROCODONE-ACETAMINOPHEN 10-325 MG PO TABS: 1.0000 | ORAL_TABLET | Freq: Four times a day (QID) | ORAL | 0 refills | Status: DC | PRN

## 2015-09-09 ENCOUNTER — Other Ambulatory Visit: Payer: Self-pay

## 2015-09-09 ENCOUNTER — Telehealth: Payer: Self-pay

## 2015-09-09 NOTE — Telephone Encounter (Signed)
Pt left vm requesting refill, pt called back aware he has a refill left he does not need a refill at the moment.

## 2015-10-07 ENCOUNTER — Emergency Department (HOSPITAL_BASED_OUTPATIENT_CLINIC_OR_DEPARTMENT_OTHER)
Admission: EM | Admit: 2015-10-07 | Discharge: 2015-10-07 | Disposition: A | Discharge status: Home / Self Care | Payer: No Typology Code available for payment source | Attending: Emergency Medicine | Admitting: Emergency Medicine

## 2015-10-07 ENCOUNTER — Encounter (HOSPITAL_BASED_OUTPATIENT_CLINIC_OR_DEPARTMENT_OTHER): Payer: Self-pay

## 2015-10-07 ENCOUNTER — Emergency Department (HOSPITAL_BASED_OUTPATIENT_CLINIC_OR_DEPARTMENT_OTHER): Payer: No Typology Code available for payment source

## 2015-10-07 DIAGNOSIS — N23 Unspecified renal colic: Secondary | ICD-10-CM | POA: Insufficient documentation

## 2015-10-07 DIAGNOSIS — R6883 Chills (without fever): Secondary | ICD-10-CM | POA: Insufficient documentation

## 2015-10-07 LAB — URINALYSIS, ROUTINE W REFLEX MICROSCOPIC
Bilirubin Urine: NEGATIVE
Glucose, UA: NEGATIVE mg/dL
Hgb urine dipstick: NEGATIVE
Ketones, ur: NEGATIVE mg/dL
Leukocytes, UA: NEGATIVE
Nitrite: NEGATIVE
Protein, ur: NEGATIVE mg/dL
Specific Gravity, Urine: 1.021 (ref 1.005–1.030)
pH: 6.5 (ref 5.0–8.0)

## 2015-10-07 MED ORDER — KETOROLAC TROMETHAMINE 30 MG/ML IJ SOLN
30.0000 mg | Freq: Once | INTRAMUSCULAR | Status: AC
  Administered 2015-10-07: 30 mg via INTRAVENOUS
  Filled 2015-10-07: qty 1

## 2015-10-07 MED ORDER — HYDROMORPHONE HCL 1 MG/ML IJ SOLN
1.0000 mg | Freq: Once | INTRAMUSCULAR | Status: AC
  Administered 2015-10-07: 1 mg via INTRAVENOUS
  Filled 2015-10-07: qty 1

## 2015-10-07 MED ORDER — ONDANSETRON HCL 4 MG/2ML IJ SOLN
4.0000 mg | Freq: Once | INTRAMUSCULAR | Status: AC
  Administered 2015-10-07: 4 mg via INTRAVENOUS
  Filled 2015-10-07: qty 2

## 2015-10-07 MED ORDER — TAMSULOSIN HCL 0.4 MG PO CAPS: 0.4000 mg | ORAL_CAPSULE | Freq: Every day | ORAL | 0 refills | Status: DC

## 2015-10-07 MED ORDER — PROMETHAZINE HCL 25 MG/ML IJ SOLN
12.5000 mg | Freq: Once | INTRAMUSCULAR | Status: AC
  Administered 2015-10-07: 19:00:00 via INTRAVENOUS
  Filled 2015-10-07: qty 1

## 2015-10-07 MED ORDER — HYDROMORPHONE HCL 1 MG/ML IJ SOLN
INTRAMUSCULAR | Status: AC
  Filled 2015-10-07: qty 1

## 2015-10-07 MED ORDER — HYDROMORPHONE HCL 1 MG/ML IJ SOLN
1.0000 mg | Freq: Once | INTRAMUSCULAR | Status: AC
  Administered 2015-10-07: 1 mg via INTRAVENOUS

## 2015-10-07 NOTE — ED Notes (Signed)
Patient transported to Ultrasound 

## 2015-10-07 NOTE — ED Triage Notes (Signed)
C/o "kidney stone"-pain to bilat flank and groin x 2 weeks-worse x today

## 2015-10-07 NOTE — Discharge Instructions (Signed)
Continue your pain medication, take Flomax as prescribed to help you passed the stones. Follow-up with urology as needed

## 2015-10-07 NOTE — ED Notes (Signed)
Pa  at bedside. 

## 2015-10-07 NOTE — ED Provider Notes (Signed)
[facility] Provider Note   CSN: [id number] Arrival date & time: 10/07/15  1628   By signing my name below, I, Casey Caldwell, attest that this documentation has been prepared under the direction and in the presence of Shaday Rayborn, PA-C. Electronically Signed: Estanislado Caldwell, Scribe. 10/07/2015. 4:46 PM.   History   Chief Complaint Chief Complaint  Patient presents with  . Flank Pain    The history is provided by the patient. No language interpreter was used.   HPI Comments:  Casey Caldwell is a 35 y.o. male with PMHx of chronic back pain, chronic kidney stones, and GERD who presents to the Emergency Department complaining of intermittent bilateral flank pain that began ~2 weeks ago. Pt states that that flank pain come and goes and that pain is at its worst on the L side but notes a stabbing pain on the R side. Pt complains of associated chills, nausea, and fluctuating stool texture. Pt last passed a kidney stone in 04/2015 and has had multiple lithotripsy procedures.  Pt denies fever, vomiting.   Past Medical History:  Diagnosis Date  . Depression    and panic/anxiety  . GERD (gastroesophageal reflux disease)   . History of kidney stones   . Insomnia   . Kidney stones   . Seizures ([place]) Childhood    Patient Active Problem List   Diagnosis Date Noted  . Acute frontal sinusitis 01/29/2015  . Adenomatous colon polyp 06/06/2012  . Nephrolithiasis 03/14/2012  . Polycythemia 03/14/2012  . Groin pain 03/14/2012  . RUQ pain 03/14/2012  . Insomnia 03/14/2012  . Seizure disorder ([place]) 03/14/2012  . Anxiety and depression 03/14/2012    Past Surgical History:  Procedure Laterality Date  . APPENDECTOMY  2011  . HYDROCELE EXCISION Left 06/2012  . LAPAROSCOPIC CHOLECYSTECTOMY  1996  . LITHOTRIPSY     four times over the years; most recent 10/08/12  . ORCHIECTOMY Left 11/2012  . SPINAL CORD STIMULATOR INSERTION  2015  . TONSILLECTOMY  1992  . WRIST GANGLION  EXCISION  2002   Right       Home Medications    Prior to Admission medications   Medication Sig Start Date End Date Taking? Authorizing Provider  docusate sodium (COLACE) 100 MG capsule Take 1 capsule (100 mg total) by mouth 2 (two) times daily. 09/26/13   Tonia Ghent, MD  escitalopram (LEXAPRO) 20 MG tablet Take 2 tablets (40 mg total) by mouth daily. 06/11/15   Tonia Ghent, MD  HYDROcodone-acetaminophen (NORCO) 10-325 MG tablet Take 1 tablet by mouth every 6 (six) hours as needed. 08/27/15   Tonia Ghent, MD  HYDROmorphone (DILAUDID) 2 MG tablet Take 1 tablet (2 mg total) by mouth every 4 (four) hours as needed for severe pain (for kidney stones). Sedation caution.  Call if questions. 08/27/15   Tonia Ghent, MD  ibuprofen (ADVIL,MOTRIN) 200 MG tablet Take 800 mg by mouth every 6 (six) hours as needed for mild pain or moderate pain (chronic groin & kidney pain).     Historical Provider, MD  meloxicam (MOBIC) 15 MG tablet Take 1 tablet (15 mg total) by mouth daily. With food 04/29/15   Tonia Ghent, MD  ondansetron (ZOFRAN ODT) 4 MG disintegrating tablet Take 1 tablet (4 mg total) by mouth every 8 (eight) hours as needed for nausea or vomiting. 08/07/15   Tonia Ghent, MD  ondansetron (ZOFRAN) 4 MG tablet Take 1 tablet (4 mg total) by mouth every  6 (six) hours. 05/30/15   Forde Dandy, MD  polyethylene glycol powder (GLYCOLAX/MIRALAX) powder MIX 17G IN 4 TO 8 OUNCES OF FLUID AND TAKE TWICE DAILY 02/17/15   Tonia Ghent, MD  tamsulosin (FLOMAX) 0.4 MG CAPS capsule Take 1 capsule (0.4 mg total) by mouth daily. 10/07/15   Madysyn Hanken, PA-C  zolpidem (AMBIEN) 10 MG tablet Take 1 tablet (10 mg total) by mouth at bedtime. 06/11/15   Tonia Ghent, MD    Family History Family History  Problem Relation Age of Onset  . Stroke Mother   . Liver disease Mother   . Irritable bowel syndrome Mother   . Kidney disease Mother   . Heart disease Father   . Colon polyps Father   .  Colon cancer Neg Hx     Social History Social History  Substance Use Topics  . Smoking status: Never Smoker  . Smokeless tobacco: Never Used  . Alcohol use 0.0 oz/week     Comment: rarely     Allergies   Tape; Trileptal [oxcarbazepine]; and Wellbutrin [bupropion]   Review of Systems Review of Systems  Constitutional: Positive for chills.  Gastrointestinal: Positive for nausea. Negative for vomiting.       Fluctuating stool textures noted.   All other systems reviewed and are negative.    Physical Exam Updated Vital Signs BP 144/94 (BP Location: Left Arm)   Pulse 68   Temp 98.5 F (36.9 C) (Oral)   Resp 20   Ht 6' (1.829 m)   Wt (!) 139.7 kg   SpO2 98%   BMI 41.77 kg/m   Physical Exam  Constitutional: He appears well-developed and well-nourished. No distress.  HENT:  Head: Normocephalic and atraumatic.  Eyes: Conjunctivae are normal.  Cardiovascular: Normal rate, regular rhythm, normal heart sounds and intact distal pulses.   Pulmonary/Chest: Effort normal and breath sounds normal. No respiratory distress. He has no wheezes.  Abdominal: He exhibits no distension. There is tenderness.  Right and left lower quadrant tenderness. Bilateral CVA tenderness.  Neurological: He is alert.  Skin: Skin is warm and dry.  Psychiatric: He has a normal mood and affect.  Nursing note and vitals reviewed.    ED Treatments / Results  DIAGNOSTIC STUDIES:  Oxygen Saturation is 96% on RA, adequate by my interpretation.    COORDINATION OF CARE:  4:51 PM Discussed treatment plan with pt at bedside and pt agreed to plan.   Labs (all labs ordered are listed, but only abnormal results are displayed) Labs Reviewed  URINALYSIS, ROUTINE W REFLEX MICROSCOPIC (NOT AT [facility])    EKG  EKG Interpretation None       Radiology US Renal  Result Date: 10/07/2015 CLINICAL DATA:  Bilateral flank and groin pain for 1 week with increasing intensity over the last 8 hours. History of  kidney stones. EXAM: RENAL / URINARY TRACT ULTRASOUND COMPLETE COMPARISON:  05/30/2015 FINDINGS: Right Kidney: Length: 12 cm. Settling calculi are evident, the largest seen towards the upper pole measuring 6 mm. No evidence for hydronephrosis. Left Kidney: Length: 12.9 cm. Multiple stones identified with 1 of the larger stones seen in the lower pole measuring 3-4 mm. No evidence for hydronephrosis. The sonographer identified a an echogenic focus posterior to the right bladder base, but there is no evidence for associated hydroureter to confirm that this is a ureteral stone. Bladder: Appears normal for degree of bladder distention. IMPRESSION: Bilateral nephrolithiasis without hydronephrosis. Electronically Signed   By: Verda Cumins.D.  On: 10/07/2015 18:08    Procedures Procedures (including critical care time)  Medications Ordered in ED Medications  HYDROmorphone (DILAUDID) injection 1 mg (1 mg Intravenous Given 10/07/15 1702)  ketorolac (TORADOL) 30 MG/ML injection 30 mg (30 mg Intravenous Given 10/07/15 1702)  ondansetron (ZOFRAN) injection 4 mg (4 mg Intravenous Given 10/07/15 1702)  HYDROmorphone (DILAUDID) injection 1 mg (1 mg Intravenous Given 10/07/15 1806)  HYDROmorphone (DILAUDID) injection 1 mg (1 mg Intravenous Given 10/07/15 1848)  promethazine (PHENERGAN) injection 12.5 mg ( Intravenous Given 10/07/15 1848)     Initial Impression / Assessment and Plan / ED Course  I have reviewed the triage vital signs and the nursing notes.  Pertinent labs & imaging results that were available during my care of the patient were reviewed by me and considered in my medical decision making (see chart for details).  Clinical Course  Comment By Time  Patient emergency department with recurrent bilateral flank pain. History of multiple kidney stones. Chart and records reviewed, patient with multiple imaging tests including CT scans and ultrasounds showing recurrent obstructing kidney stones and  hydronephrosis. Patient is afebrile, normal vital signs, not complaining of any urinary symptoms or vomiting. He does have chronic pain and is already on Vicodin and Dilaudid at home which he states is not helping his pain. We'll get urinalysis and ordered Toradol, Dilaudid, Zofran IV. Ultrasound ordered. Jeannett Senior, PA-C 09/13 1657  Patient continues to have pain despite 2 doses of Dilaudid, Toradol, Zofran. He states pain is dull free time but keeps coming back. Ultrasound showed no hydronephrosis, bilateral renal stones are noted. Will give another dose of Dilaudid and Phenergan Jeannett Senior, PA-C 09/13 1854   7:52 PM Patient here with recurrent kidney stones. Afebrile, nontoxic appearing. Urinalysis showed no signs of infection. Ultrasound showed bilateral renal stones with no hydronephrosis. Patient's pain was controlled to an emergency department with 3 doses of pain medication and antibiotics. Patient feels much better at this time I would like to be discharged home. He has pain medications at home, will add Flomax. Follow-up with urology, return precautions discussed.=  Final Clinical Impressions(s) / ED Diagnoses   Final diagnoses:  Renal colic    New Prescriptions New Prescriptions   TAMSULOSIN (FLOMAX) 0.4 MG CAPS CAPSULE    Take 1 capsule (0.4 mg total) by mouth daily.   I personally performed the services described in this documentation, which was scribed in my presence. The recorded information has been reviewed and is accurate.     Jeannett Senior, PA-C 10/07/15 1953    Fredia Sorrow, MD 10/10/15 [phone]

## 2015-10-12 ENCOUNTER — Encounter: Payer: Self-pay | Admitting: Family Medicine

## 2015-10-13 ENCOUNTER — Other Ambulatory Visit: Payer: Self-pay | Admitting: Family Medicine

## 2015-10-13 MED ORDER — HYDROCODONE-ACETAMINOPHEN 10-325 MG PO TABS: 1.0000 | ORAL_TABLET | Freq: Four times a day (QID) | ORAL | 0 refills | Status: DC | PRN

## 2015-10-13 MED ORDER — HYDROMORPHONE HCL 2 MG PO TABS: 2.0000 mg | ORAL_TABLET | ORAL | 0 refills | Status: DC | PRN

## 2015-10-21 ENCOUNTER — Emergency Department (HOSPITAL_BASED_OUTPATIENT_CLINIC_OR_DEPARTMENT_OTHER)
Admission: EM | Admit: 2015-10-21 | Discharge: 2015-10-21 | Disposition: A | Discharge status: Home / Self Care | Payer: No Typology Code available for payment source | Attending: Emergency Medicine | Admitting: Emergency Medicine

## 2015-10-21 ENCOUNTER — Encounter (HOSPITAL_BASED_OUTPATIENT_CLINIC_OR_DEPARTMENT_OTHER): Payer: Self-pay

## 2015-10-21 DIAGNOSIS — R109 Unspecified abdominal pain: Secondary | ICD-10-CM | POA: Insufficient documentation

## 2015-10-21 LAB — CBC WITH DIFFERENTIAL/PLATELET
Basophils Absolute: 0 10*3/uL (ref 0.0–0.1)
Basophils Relative: 0 %
Eosinophils Absolute: 0.2 10*3/uL (ref 0.0–0.7)
Eosinophils Relative: 2 %
HCT: 47.5 % (ref 39.0–52.0)
Hemoglobin: 16.8 g/dL (ref 13.0–17.0)
Lymphocytes Relative: 30 %
Lymphs Abs: 3 10*3/uL (ref 0.7–4.0)
MCH: 30.2 pg (ref 26.0–34.0)
MCHC: 35.4 g/dL (ref 30.0–36.0)
MCV: 85.4 fL (ref 78.0–100.0)
Monocytes Absolute: 1.2 10*3/uL — ABNORMAL HIGH (ref 0.1–1.0)
Monocytes Relative: 11 %
Neutro Abs: 5.9 10*3/uL (ref 1.7–7.7)
Neutrophils Relative %: 57 %
Platelets: 303 10*3/uL (ref 150–400)
RBC: 5.56 MIL/uL (ref 4.22–5.81)
RDW: 13.6 % (ref 11.5–15.5)
WBC: 10.3 10*3/uL (ref 4.0–10.5)

## 2015-10-21 LAB — URINALYSIS, ROUTINE W REFLEX MICROSCOPIC
Bilirubin Urine: NEGATIVE
Glucose, UA: NEGATIVE mg/dL
Hgb urine dipstick: NEGATIVE
Ketones, ur: NEGATIVE mg/dL
Leukocytes, UA: NEGATIVE
Nitrite: NEGATIVE
Protein, ur: NEGATIVE mg/dL
Specific Gravity, Urine: 1.025 (ref 1.005–1.030)
pH: 6 (ref 5.0–8.0)

## 2015-10-21 LAB — BASIC METABOLIC PANEL
Anion gap: 8 (ref 5–15)
BUN: 14 mg/dL (ref 6–20)
CO2: 27 mmol/L (ref 22–32)
Calcium: 9.2 mg/dL (ref 8.9–10.3)
Chloride: 103 mmol/L (ref 101–111)
Creatinine, Ser: 0.95 mg/dL (ref 0.61–1.24)
GFR calc Af Amer: >60 mL/min (ref >60)
GFR calc non Af Amer: >60 mL/min (ref >60)
Glucose, Bld: 92 mg/dL (ref 65–99)
Potassium: 4 mmol/L (ref 3.5–5.1)
Sodium: 138 mmol/L (ref 135–145)

## 2015-10-21 MED ORDER — HYDROMORPHONE HCL 1 MG/ML IJ SOLN
2.0000 mg | Freq: Once | INTRAMUSCULAR | Status: AC
  Administered 2015-10-21: 2 mg via INTRAVENOUS
  Filled 2015-10-21: qty 2

## 2015-10-21 MED ORDER — SODIUM CHLORIDE 0.9 % IV BOLUS (SEPSIS)
1000.0000 mL | Freq: Once | INTRAVENOUS | Status: AC
  Administered 2015-10-21: 1000 mL via INTRAVENOUS

## 2015-10-21 MED ORDER — HYDROMORPHONE HCL 1 MG/ML IJ SOLN
1.0000 mg | Freq: Once | INTRAMUSCULAR | Status: AC
  Administered 2015-10-21: 1 mg via INTRAVENOUS
  Filled 2015-10-21: qty 1

## 2015-10-21 MED ORDER — TAMSULOSIN HCL 0.4 MG PO CAPS: 0.4000 mg | ORAL_CAPSULE | Freq: Every day | ORAL | 0 refills | Status: DC

## 2015-10-21 MED ORDER — ONDANSETRON HCL 4 MG/2ML IJ SOLN
4.0000 mg | Freq: Once | INTRAMUSCULAR | Status: AC
  Administered 2015-10-21: 4 mg via INTRAVENOUS
  Filled 2015-10-21: qty 2

## 2015-10-21 MED ORDER — HYDROMORPHONE HCL 1 MG/ML IJ SOLN
2.0000 mg | Freq: Once | INTRAMUSCULAR | Status: AC
Start: 2015-10-21 — End: 2015-10-21
  Administered 2015-10-21: 2 mg via INTRAVENOUS
  Filled 2015-10-21: qty 2

## 2015-10-21 MED ORDER — KETOROLAC TROMETHAMINE 15 MG/ML IJ SOLN
15.0000 mg | Freq: Once | INTRAMUSCULAR | Status: AC
  Administered 2015-10-21: 15 mg via INTRAVENOUS
  Filled 2015-10-21: qty 1

## 2015-10-21 NOTE — ED Notes (Signed)
Pt still unable to provide urine sample 

## 2015-10-21 NOTE — ED Triage Notes (Signed)
C/o bilat flank and groin pain started last night-known kidney stone

## 2015-10-27 ENCOUNTER — Ambulatory Visit: Payer: No Typology Code available for payment source | Admitting: Family Medicine

## 2015-10-27 NOTE — ED Provider Notes (Signed)
[facility]   CSN: [id number] Arrival date & time: 10/21/15  1657     History   Chief Complaint Chief Complaint  Patient presents with  . Flank Pain    HPI Casey Caldwell is a 35 y.o. male.  HPI  35 year old male flank pain. He has a past history kidney stones. States the current symptoms feel very similar. Pain is in the bilateral mid to lower back with radiation to his left flank and left groin. No specific urinary complaints. No fevers or chills. Nausea, but no vomiting. Per review of records, he does indeed have a past history kidney stones with numerous prior imaging showing multiple stones. He is on pain medication for chronic pain. Is been taking this without much relief.  Past Medical History:  Diagnosis Date  . Depression    and panic/anxiety  . GERD (gastroesophageal reflux disease)   . History of kidney stones   . Insomnia   . Kidney stones   . Seizures ([place]) Childhood    Patient Active Problem List   Diagnosis Date Noted  . Acute frontal sinusitis 01/29/2015  . Adenomatous colon polyp 06/06/2012  . Nephrolithiasis 03/14/2012  . Polycythemia 03/14/2012  . Groin pain 03/14/2012  . RUQ pain 03/14/2012  . Insomnia 03/14/2012  . Seizure disorder ([place]) 03/14/2012  . Anxiety and depression 03/14/2012    Past Surgical History:  Procedure Laterality Date  . APPENDECTOMY  2011  . HYDROCELE EXCISION Left 06/2012  . LAPAROSCOPIC CHOLECYSTECTOMY  1996  . LITHOTRIPSY     four times over the years; most recent 10/08/12  . ORCHIECTOMY Left 11/2012  . SPINAL CORD STIMULATOR INSERTION  2015  . TONSILLECTOMY  1992  . WRIST GANGLION EXCISION  2002   Right       Home Medications    Prior to Admission medications   Medication Sig Start Date End Date Taking? Authorizing Provider  docusate sodium (COLACE) 100 MG capsule Take 1 capsule (100 mg total) by mouth 2 (two) times daily. 09/26/13   Tonia Ghent, MD  escitalopram (LEXAPRO) 20 MG  tablet Take 2 tablets (40 mg total) by mouth daily. 06/11/15   Tonia Ghent, MD  HYDROcodone-acetaminophen (NORCO) 10-325 MG tablet Take 1 tablet by mouth every 6 (six) hours as needed. 10/13/15   Tonia Ghent, MD  HYDROmorphone (DILAUDID) 2 MG tablet Take 1 tablet (2 mg total) by mouth every 4 (four) hours as needed for severe pain (for kidney stones). Sedation caution.  Call if questions. 10/13/15   Tonia Ghent, MD  ibuprofen (ADVIL,MOTRIN) 200 MG tablet Take 800 mg by mouth every 6 (six) hours as needed for mild pain or moderate pain (chronic groin & kidney pain).     Historical Provider, MD  meloxicam (MOBIC) 15 MG tablet Take 1 tablet (15 mg total) by mouth daily. With food 04/29/15   Tonia Ghent, MD  ondansetron (ZOFRAN ODT) 4 MG disintegrating tablet Take 1 tablet (4 mg total) by mouth every 8 (eight) hours as needed for nausea or vomiting. 08/07/15   Tonia Ghent, MD  ondansetron (ZOFRAN) 4 MG tablet Take 1 tablet (4 mg total) by mouth every 6 (six) hours. 05/30/15   Forde Dandy, MD  polyethylene glycol powder (GLYCOLAX/MIRALAX) powder MIX 17G IN 4 TO 8 OUNCES OF FLUID AND TAKE TWICE DAILY 02/17/15   Tonia Ghent, MD  tamsulosin (FLOMAX) 0.4 MG CAPS capsule Take 1 capsule (0.4 mg total) by mouth daily.  10/07/15   Tatyana Kirichenko, PA-C  tamsulosin (FLOMAX) 0.4 MG CAPS capsule Take 1 capsule (0.4 mg total) by mouth daily. 10/21/15   Virgel Manifold, MD  zolpidem (AMBIEN) 10 MG tablet Take 1 tablet (10 mg total) by mouth at bedtime. 06/11/15   Tonia Ghent, MD    Family History Family History  Problem Relation Age of Onset  . Stroke Mother   . Liver disease Mother   . Irritable bowel syndrome Mother   . Kidney disease Mother   . Heart disease Father   . Colon polyps Father   . Colon cancer Neg Hx     Social History Social History  Substance Use Topics  . Smoking status: Never Smoker  . Smokeless tobacco: Never Used  . Alcohol use 0.0 oz/week     Comment: rarely      Allergies   Phenergan [promethazine hcl]; Tape; Trileptal [oxcarbazepine]; and Wellbutrin [bupropion]   Review of Systems Review of Systems  All systems reviewed and negative, other than as noted in HPI.   Physical Exam Updated Vital Signs BP 107/89   Pulse 64   Temp 98.4 F (36.9 C) (Oral)   Resp 16   SpO2 96%   Physical Exam  Constitutional: He appears well-developed and well-nourished. No distress.  Laying in bed on left side. Appears uncomfortable. Obese.  HENT:  Head: Normocephalic and atraumatic.  Eyes: Conjunctivae are normal. Right eye exhibits no discharge. Left eye exhibits no discharge.  Neck: Neck supple.  Cardiovascular: Normal rate, regular rhythm and normal heart sounds.  Exam reveals no gallop and no friction rub.   No murmur heard. Pulmonary/Chest: Effort normal and breath sounds normal. No respiratory distress.  Abdominal: Soft. He exhibits no distension. There is no tenderness.  Musculoskeletal: He exhibits no edema or tenderness.  Left CVA tenderness  Neurological: He is alert.  Skin: Skin is warm and dry.  Psychiatric: He has a normal mood and affect. His behavior is normal. Thought content normal.  Nursing note and vitals reviewed.    ED Treatments / Results  Labs (all labs ordered are listed, but only abnormal results are displayed) Labs Reviewed  CBC WITH DIFFERENTIAL/PLATELET - Abnormal; Notable for the following:       Result Value   Monocytes Absolute 1.2 (*)    All other components within normal limits  URINALYSIS, ROUTINE W REFLEX MICROSCOPIC (NOT AT [facility])  BASIC METABOLIC PANEL    EKG  EKG Interpretation None       Radiology No results found.  Procedures Procedures (including critical care time)  Medications Ordered in ED Medications  HYDROmorphone (DILAUDID) injection 1 mg (1 mg Intravenous Given 10/21/15 1731)  ketorolac (TORADOL) 15 MG/ML injection 15 mg (15 mg Intravenous Given 10/21/15 1730)  sodium chloride  0.9 % bolus 1,000 mL (0 mLs Intravenous Stopped 10/21/15 1824)  ondansetron (ZOFRAN) injection 4 mg (4 mg Intravenous Given 10/21/15 1736)  HYDROmorphone (DILAUDID) injection 2 mg (2 mg Intravenous Given 10/21/15 1838)  HYDROmorphone (DILAUDID) injection 2 mg (2 mg Intravenous Given 10/21/15 1956)  ondansetron (ZOFRAN) injection 4 mg (4 mg Intravenous Given 10/21/15 1953)     Initial Impression / Assessment and Plan / ED Course  I have reviewed the triage vital signs and the nursing notes.  Pertinent labs & imaging results that were available during my care of the patient were reviewed by me and considered in my medical decision making (see chart for details).  Clinical Course    35 year old male with  symptoms consistent with ureteral colic. He has a past history of same. UA does not show hematuria though. He was treated symptomatically with improvement of his symptoms. He is given a prescription for Flomax. He is starting) for home pain medication. No additional pain prescriptions were provided but he was advised to take NSAIDs with his narcotics as needed. Return precautions were discussed. Outpatient neurology follow-up otherwise.  Final Clinical Impressions(s) / ED Diagnoses   Final diagnoses:  Flank pain    New Prescriptions Discharge Medication List as of 10/21/2015  7:43 PM    START taking these medications   Details  !! tamsulosin (FLOMAX) 0.4 MG CAPS capsule Take 1 capsule (0.4 mg total) by mouth daily., Starting Wed 10/21/2015, Print     !! - Potential duplicate medications found. Please discuss with provider.       Virgel Manifold, MD 10/27/15 [phone]

## 2015-10-29 ENCOUNTER — Ambulatory Visit (INDEPENDENT_AMBULATORY_CARE_PROVIDER_SITE_OTHER): Payer: Self-pay | Admitting: Family Medicine

## 2015-10-29 ENCOUNTER — Encounter: Payer: Self-pay | Admitting: Family Medicine

## 2015-10-29 VITALS — BP 144/94 | HR 97 | Temp 97.9°F | Wt 310.0 lb

## 2015-10-29 DIAGNOSIS — F329 Major depressive disorder, single episode, unspecified: Secondary | ICD-10-CM

## 2015-10-29 DIAGNOSIS — F32A Depression, unspecified: Secondary | ICD-10-CM

## 2015-10-29 DIAGNOSIS — G894 Chronic pain syndrome: Secondary | ICD-10-CM

## 2015-10-29 DIAGNOSIS — N200 Calculus of kidney: Secondary | ICD-10-CM

## 2015-10-29 DIAGNOSIS — F419 Anxiety disorder, unspecified: Secondary | ICD-10-CM

## 2015-10-29 DIAGNOSIS — Z23 Encounter for immunization: Secondary | ICD-10-CM

## 2015-10-29 DIAGNOSIS — F418 Other specified anxiety disorders: Secondary | ICD-10-CM

## 2015-10-29 MED ORDER — HYDROMORPHONE HCL 2 MG PO TABS: 2.0000 mg | ORAL_TABLET | ORAL | 0 refills | Status: DC | PRN

## 2015-10-29 MED ORDER — IBUPROFEN 200 MG PO TABS: 600.0000 mg | ORAL_TABLET | Freq: Two times a day (BID) | ORAL | Status: DC | PRN

## 2015-10-29 MED ORDER — HYDROCODONE-ACETAMINOPHEN 10-325 MG PO TABS: 1.0000 | ORAL_TABLET | Freq: Four times a day (QID) | ORAL | 0 refills | Status: DC | PRN

## 2015-10-29 MED ORDER — TAMSULOSIN HCL 0.4 MG PO CAPS: 0.4000 mg | ORAL_CAPSULE | Freq: Every day | ORAL | 2 refills | Status: DC

## 2015-10-29 NOTE — Progress Notes (Signed)
Pre visit review using our clinic review tool, if applicable. No additional management support is needed unless otherwise documented below in the visit note. 

## 2015-10-29 NOTE — Progress Notes (Signed)
He had 4 months w/o ER eval, still passing stones but not to the point of needing ER treatment.  Then had mult bad stones in the meantime, with two ER visits.  He has needed more zofran recently.  Prev on K citrate w/o change in stone frequency.  We talked about inc in fluids to try to limit stone sx.    Mood is some better this week, but he was prev much worse off with prev inc in pain.  Out of work since May.  He is socially withdrawn due to pain.  No SI/HI.    He still has his baseline groin pain, still with radicular L thigh pain.  He has had variable constipation and diarrhea. His plan is to get insurance through either the exchanges or his wife's job and then get pain clinic eval, in 2018.    He has some relief from spinal cord stimulator and can tell when it is running down, in need of charging.    Meds, vitals, and allergies reviewed.   ROS: Per HPI unless specifically indicated in ROS section   nad ncat Neck supple, no LA rrr ctab abd soft Groin not examined.

## 2015-10-29 NOTE — Patient Instructions (Addendum)
Check your insurance and see what pain clinic will be in network.  Let me know so I can get a referral started.   Let me consider med options for your mood.   Take care.  Glad to see you.

## 2015-10-30 DIAGNOSIS — G894 Chronic pain syndrome: Secondary | ICD-10-CM | POA: Insufficient documentation

## 2015-10-30 NOTE — Assessment & Plan Note (Signed)
Fortunately he had about a 4 month period where he was able stay out of the emergency room. He has had 2 recent visits at the ER. He has still been passing stones throughout the past year. He has had urology follow-up. He is try dietary modifications and potassium citrate before, without change in his stone frequency. I refilled his meds for when necessary use. At this point he still okay for outpatient follow-up. I have no evidence that he is abusing his medications. He does have significant ongoing pain requiringchroni

## 2015-10-30 NOTE — Assessment & Plan Note (Signed)
His mood is clearly been worse when he has been having more pain. Discussed with patient. At this point he is still okay for outpatient follow-up. His wife is supportive. He has no active suicidal or homicidal intent. I would continue his current medications for now. I would like to consider his case and see what else we can do in terms of medicines help with his mood. He agrees. I will update him in the near future. Again he is still okay for outpatient follow-up.

## 2015-10-30 NOTE — Assessment & Plan Note (Signed)
He can clearly tell a difference in his back and groin pain when the spinal stimulator is charged and functioning normally. He is going to need follow-up with a pain clinic. He is going to check on his insurance to see who would be in his network, and then we can start the referral process to get him follow-up as soon as possible. Otherwise I would continue his medications as is for now. Discussed with patient. >25 minutes spent in face to face time with patient, >50% spent in counselling or coordination of care.

## 2015-11-03 ENCOUNTER — Telehealth: Payer: Self-pay | Admitting: Family Medicine

## 2015-11-03 MED ORDER — ESCITALOPRAM OXALATE 20 MG PO TABS: 20.0000 mg | ORAL_TABLET | Freq: Every day | ORAL | Status: DC

## 2015-11-03 MED ORDER — ARIPIPRAZOLE 2 MG PO TABS: 2.0000 mg | ORAL_TABLET | Freq: Every day | ORAL | 1 refills | Status: DC

## 2015-11-03 NOTE — Telephone Encounter (Signed)
Patient advised.

## 2015-11-03 NOTE — Telephone Encounter (Signed)
Call pt. Would do the following re: depression.  Taper lexapro down from 2 tabs a day.  Take 1.5 tabs a day for 10 days, then go down to 1 tab a day thereafter.  Goal dose 1 tab a day.   In the meantime, add on abilify 2 mg a day rx sent.  Update me about his mood via call or mychart message in a few days.   Thanks.

## 2015-11-09 ENCOUNTER — Telehealth: Payer: Self-pay | Admitting: Family Medicine

## 2015-11-09 NOTE — Telephone Encounter (Signed)
Please call pt and get update on mood with lower dose of lexapro and start of abilify.  Thanks.

## 2015-11-10 NOTE — Telephone Encounter (Addendum)
Left detailed message on voicemail to return call with update. Patient says he is doing ok.  He feels a little bit of difference in his mood since cutting back on the Lexapro but says he has not started the Abilify yet.  He was of the understanding to start the Abilify after 10 days of taking the 1-1/2 Lexapro.  I am pretty sure that his directions were to start the Abilify as he was tapering down on the Lexapro, please clarify.  Patient also says that at his last visit, either he forgot to pick up the pain med Rx or you forgot to print it or it is lost in their "messy" house.  He wants to know what can be done to replace it.

## 2015-11-10 NOTE — Telephone Encounter (Signed)
Patient advised.

## 2015-11-10 NOTE — Telephone Encounter (Signed)
Okay to start abilify at this point.  Please have him update me about his mood.  Have him check with the pharmacy and check the house again, rx was done at [facility].  Thanks.

## 2015-11-11 ENCOUNTER — Encounter: Payer: Self-pay | Admitting: Family Medicine

## 2015-11-12 ENCOUNTER — Telehealth: Payer: Self-pay | Admitting: Family Medicine

## 2015-11-12 MED ORDER — HYDROCODONE-ACETAMINOPHEN 10-325 MG PO TABS: 1.0000 | ORAL_TABLET | Freq: Four times a day (QID) | ORAL | 0 refills | Status: DC | PRN

## 2015-11-12 MED ORDER — HYDROMORPHONE HCL 2 MG PO TABS: 2.0000 mg | ORAL_TABLET | ORAL | 0 refills | Status: DC | PRN

## 2015-11-12 NOTE — Telephone Encounter (Signed)
Patient advised.  Rx left at front desk for pick up. 

## 2015-11-12 NOTE — Telephone Encounter (Signed)
For charting purposes- I checked [place].  He hasn't recently filled the last written rxs for dilaudid and hydrocodone.  He states that he lost the hard copy.  I am willing to make an except for this case at this point, and reprint the rxs.   1. I don't have evidence of (or suspect) abuse, diversion, etc.  2. He has documented clinical reasons for pain.  3. It is likely more detrimental to his health if I don't print the rx and he is without pain meds given his chronic condition.

## 2015-11-14 ENCOUNTER — Encounter (HOSPITAL_BASED_OUTPATIENT_CLINIC_OR_DEPARTMENT_OTHER): Payer: Self-pay | Admitting: Emergency Medicine

## 2015-11-14 ENCOUNTER — Emergency Department (HOSPITAL_BASED_OUTPATIENT_CLINIC_OR_DEPARTMENT_OTHER)
Admission: EM | Admit: 2015-11-14 | Discharge: 2015-11-15 | Disposition: A | Discharge status: Home / Self Care | Payer: No Typology Code available for payment source | Attending: Emergency Medicine | Admitting: Emergency Medicine

## 2015-11-14 DIAGNOSIS — R1030 Lower abdominal pain, unspecified: Secondary | ICD-10-CM | POA: Insufficient documentation

## 2015-11-14 DIAGNOSIS — R6883 Chills (without fever): Secondary | ICD-10-CM | POA: Insufficient documentation

## 2015-11-14 DIAGNOSIS — R109 Unspecified abdominal pain: Secondary | ICD-10-CM | POA: Insufficient documentation

## 2015-11-14 DIAGNOSIS — R197 Diarrhea, unspecified: Secondary | ICD-10-CM | POA: Insufficient documentation

## 2015-11-14 LAB — CBC
HCT: 49.6 % (ref 39.0–52.0)
Hemoglobin: 17.2 g/dL — ABNORMAL HIGH (ref 13.0–17.0)
MCH: 30.1 pg (ref 26.0–34.0)
MCHC: 34.7 g/dL (ref 30.0–36.0)
MCV: 86.9 fL (ref 78.0–100.0)
Platelets: 311 10*3/uL (ref 150–400)
RBC: 5.71 MIL/uL (ref 4.22–5.81)
RDW: 13.7 % (ref 11.5–15.5)
WBC: 10.3 10*3/uL (ref 4.0–10.5)

## 2015-11-14 LAB — BASIC METABOLIC PANEL
Anion gap: 7 (ref 5–15)
BUN: 14 mg/dL (ref 6–20)
CO2: 29 mmol/L (ref 22–32)
Calcium: 9.4 mg/dL (ref 8.9–10.3)
Chloride: 105 mmol/L (ref 101–111)
Creatinine, Ser: 1.01 mg/dL (ref 0.61–1.24)
GFR calc Af Amer: >60 mL/min (ref >60)
GFR calc non Af Amer: >60 mL/min (ref >60)
Glucose, Bld: 97 mg/dL (ref 65–99)
Potassium: 4.9 mmol/L (ref 3.5–5.1)
Sodium: 141 mmol/L (ref 135–145)

## 2015-11-14 MED ORDER — KETOROLAC TROMETHAMINE 30 MG/ML IJ SOLN
30.0000 mg | Freq: Once | INTRAMUSCULAR | Status: AC
  Administered 2015-11-14: 30 mg via INTRAVENOUS
  Filled 2015-11-14: qty 1

## 2015-11-14 MED ORDER — ONDANSETRON HCL 4 MG/2ML IJ SOLN
4.0000 mg | Freq: Once | INTRAMUSCULAR | Status: AC
  Administered 2015-11-14: 4 mg via INTRAVENOUS
  Filled 2015-11-14: qty 2

## 2015-11-14 NOTE — ED Provider Notes (Signed)
Emergency Department Provider Note By signing my name below, I, Neta Mends, attest that this documentation has been prepared under the direction and in the presence of Margette Fast, MD . Electronically Signed: Neta Mends, ED Scribe. 11/14/2015. 11:11 PM.   I have reviewed the triage vital signs and the nursing notes.   HISTORY  Chief Complaint Flank Pain   HPI Casey Caldwell is a 35 y.o. male with PMHx of kidney stones with complaint of constant, worsening bilateral flank and groin pain x 1 day. Pt states the pain is the worst in the groin. Pt describes the groin pain as "stabbing." Pt complains of associated chills, dirrhea. Pt reports PSHx of cholecystectomy, orchiectomy, and 8-10 surgeries for kidney stones. Pt hast taken Toradol that has provided mild relief. Pt denies fever, vomiting.   Past Medical History:  Diagnosis Date  . Depression    and panic/anxiety  . GERD (gastroesophageal reflux disease)   . History of kidney stones   . Insomnia   . Kidney stones   . Seizures ([place]) Childhood    Patient Active Problem List   Diagnosis Date Noted  . Chronic pain syndrome 10/30/2015  . Adenomatous colon polyp 06/06/2012  . Nephrolithiasis 03/14/2012  . Polycythemia 03/14/2012  . Groin pain 03/14/2012  . RUQ pain 03/14/2012  . Insomnia 03/14/2012  . Seizure disorder ([place]) 03/14/2012  . Anxiety and depression 03/14/2012    Past Surgical History:  Procedure Laterality Date  . APPENDECTOMY  2011  . HYDROCELE EXCISION Left 06/2012  . LAPAROSCOPIC CHOLECYSTECTOMY  1996  . LITHOTRIPSY     four times over the years; most recent 10/08/12  . ORCHIECTOMY Left 11/2012  . SPINAL CORD STIMULATOR INSERTION  2015  . TONSILLECTOMY  1992  . WRIST GANGLION EXCISION  2002   Right    Current Outpatient Rx  . Order #: [id number] Class: Normal  . Order #: [id number] Class: Print  . Order #: [id number] Class: No Print  . Order #: [id number] Class: Print  . Order #:  [id number] Class: Print  . Order #: [id number] Class: No Print  . Order #: [id number] Class: Normal  . Order #: [id number] Class: Normal  . Order #: [id number] Class: Normal  . Order #: [id number] Class: Print    Allergies Phenergan [promethazine hcl]; Tape; Trileptal [oxcarbazepine]; and Wellbutrin [bupropion]  Family History  Problem Relation Age of Onset  . Stroke Mother   . Liver disease Mother   . Irritable bowel syndrome Mother   . Kidney disease Mother   . Heart disease Father   . Colon polyps Father   . Colon cancer Neg Hx     Social History Social History  Substance Use Topics  . Smoking status: Never Smoker  . Smokeless tobacco: Never Used  . Alcohol use 0.0 oz/week     Comment: rarely    Review of Systems  Constitutional: No fever/chills Eyes: No visual changes. ENT: No sore throat. Cardiovascular: Denies chest pain. Respiratory: Denies shortness of breath. Gastrointestinal: No abdominal pain.  No nausea, no vomiting.  No diarrhea.  No constipation. Genitourinary: Negative for dysuria. Bilateral flank and groin pain.  Musculoskeletal: Negative for back pain. Skin: Negative for rash. Neurological: Negative for headaches, focal weakness or numbness.  10-point ROS otherwise negative.  ____________________________________________   PHYSICAL EXAM:  VITAL SIGNS: ED Triage Vitals  Enc Vitals Group     BP 11/14/15 2208 (!) 124/103     Pulse Rate 11/14/15 2208 110     Resp 11/14/15 2208 22  Temp 11/14/15 2208 98.2 F (36.8 C)     SpO2 11/14/15 2208 97 %     Weight 11/14/15 2208 (!) 310 lb (140.6 kg)     Pain Score 11/14/15 2207 8    Constitutional: Alert and oriented. Appears uncomfortable. Eyes: Conjunctivae are normal.  Head: Atraumatic. Nose: No congestion/rhinnorhea. Mouth/Throat: Mucous membranes are moist.  Oropharynx non-erythematous. Neck: No stridor.  Cardiovascular: Normal rate, regular rhythm. Good peripheral circulation. Grossly normal heart  sounds.   Respiratory: Normal respiratory effort.  No retractions. Lungs CTAB. Gastrointestinal: Soft and nontender. No distention. Mild bilateral CVA tenderness. Musculoskeletal: No lower extremity tenderness nor edema. No gross deformities of extremities. Neurologic:  Normal speech and language. No gross focal neurologic deficits are appreciated.  Skin:  Skin is warm, dry and intact. No rash noted. Psychiatric: Mood and affect are normal. Speech and behavior are normal.  ____________________________________________ DIAGNOSTIC STUDIES:  Oxygen Saturation is 97% on RA, normal by my interpretation.    COORDINATION OF CARE:  11:12 PM Discussed treatment plan with pt at bedside and pt agreed to plan.    LABS (all labs ordered are listed, but only abnormal results are displayed)  Labs Reviewed  CBC - Abnormal; Notable for the following:       Result Value   Hemoglobin 17.2 (*)    All other components within normal limits  URINALYSIS, ROUTINE W REFLEX MICROSCOPIC (NOT AT [facility])  BASIC METABOLIC PANEL   ____________________________________________  RADIOLOGY  None ____________________________________________   PROCEDURES  Procedure(s) performed:   Procedures  Emergency Focused Ultrasound Exam Limited retroperitoneal ultrasound of kidneys  Performed and interpreted by Dr. Laverta Baltimore Indication: flank pain Focused abdominal ultrasound with both kidneys imaged in transverse and longitudinal planes in real-time. Interpretation: No hydronephrosis visualized.   Images archived electronically  CPT Code: [phone] (limited retroperitoneal)  ____________________________________________   INITIAL IMPRESSION / ASSESSMENT AND PLAN / ED COURSE  Pertinent labs & imaging results that were available during my care of the patient were reviewed by me and considered in my medical decision making (see chart for details).  Patient presents to the emergency department for evaluation  ofbilateral flank pain radiating to his groin. He has Merinda Victorino history of renal stones and is followed by Dr. Rosana Hoes. He is required multiple stents and other urologic procedures in the past. No fevers. Appears uncomfortable but in no acute distress. He has had 3 CT scans of his abdomen and pelvis this year for similar complaint. No fevers or chills. Plan for bedside ultrasound and labs including urinalysis.   03:10 AM  Patient is feeling much better after pain medication. He has opiate pain medication at home. He will follow up with Urology PRN. Will discharge at this time.   At this time, I do not feel there is any life-threatening condition present. I have reviewed and discussed all results (EKG, imaging, lab, urine as appropriate), exam findings with patient. I have reviewed nursing notes and appropriate previous records.  I feel the patient is safe to be discharged home without further emergent workup. Discussed usual and customary return precautions. Patient and family (if present) verbalize understanding and are comfortable with this plan.  Patient will follow-up with their primary care provider. If they do not have a primary care provider, information for follow-up has been provided to them. All questions have been answered.  ____________________________________________  FINAL CLINICAL IMPRESSION(S) / ED DIAGNOSES  Final diagnoses:  Flank pain     MEDICATIONS GIVEN DURING THIS VISIT:  Medications  ketorolac (TORADOL) 30 MG/ML injection 30 mg (30 mg Intravenous Given 11/14/15 2339)  ondansetron (ZOFRAN) injection 4 mg (4 mg Intravenous Given 11/14/15 2338)  HYDROmorphone (DILAUDID) injection 1 mg (1 mg Intravenous Given 11/15/15 0029)  HYDROmorphone (DILAUDID) injection 2 mg (2 mg Intravenous Given 11/15/15 0230)  ondansetron (ZOFRAN-ODT) disintegrating tablet 4 mg (4 mg Oral Given 11/15/15 0322)     NEW OUTPATIENT MEDICATIONS STARTED DURING THIS VISIT:  None   Note:  This document  was prepared using Dragon voice recognition software and may include unintentional dictation errors.  Nanda Quinton, MD Emergency Medicine  Documentation performed with the assistance of the scribe. I have reviewed all documentation and made changes as needed.     Margette Fast, MD 11/15/15 [phone]

## 2015-11-14 NOTE — ED Notes (Signed)
Pt requesting dilaudid when toradol and zofran given by this RN.  States toradol does not work for his pain. MD aware and states he will follow up with pt.

## 2015-11-14 NOTE — ED Notes (Signed)
Attempted IV access x 2 without success.

## 2015-11-14 NOTE — ED Triage Notes (Signed)
Pt in c/o bilateral flank pain x 1 day but worse in the last 30 min. Hx of kidney stones. Pt is alert, interactive, in NAD.

## 2015-11-15 LAB — URINALYSIS, ROUTINE W REFLEX MICROSCOPIC
Bilirubin Urine: NEGATIVE
Glucose, UA: NEGATIVE mg/dL
Hgb urine dipstick: NEGATIVE
Ketones, ur: NEGATIVE mg/dL
Leukocytes, UA: NEGATIVE
Nitrite: NEGATIVE
Protein, ur: NEGATIVE mg/dL
Specific Gravity, Urine: 1.029 (ref 1.005–1.030)
pH: 5 (ref 5.0–8.0)

## 2015-11-15 MED ORDER — HYDROMORPHONE HCL 1 MG/ML IJ SOLN
1.0000 mg | Freq: Once | INTRAMUSCULAR | Status: AC
  Administered 2015-11-15: 1 mg via INTRAVENOUS
  Filled 2015-11-15: qty 1

## 2015-11-15 MED ORDER — ONDANSETRON 4 MG PO TBDP
4.0000 mg | ORAL_TABLET | Freq: Once | ORAL | Status: AC
  Administered 2015-11-15: 4 mg via ORAL
  Filled 2015-11-15: qty 1

## 2015-11-15 MED ORDER — HYDROMORPHONE HCL 1 MG/ML IJ SOLN
2.0000 mg | Freq: Once | INTRAMUSCULAR | Status: AC
Start: 2015-11-15 — End: 2015-11-15
  Administered 2015-11-15: 2 mg via INTRAVENOUS
  Filled 2015-11-15: qty 2

## 2015-11-15 NOTE — Discharge Instructions (Signed)

## 2015-11-15 NOTE — ED Notes (Signed)
Pt requesting a snack at d/c with a ginger ale. Snack given

## 2015-12-04 ENCOUNTER — Telehealth: Payer: Self-pay

## 2015-12-04 NOTE — Telephone Encounter (Signed)
Pt is going to get new pharmacy and request refill zolpidem to [facility] in [place]. Advised pt to have new pharmacy contact [facility] [address] and have available refills transferred. Pt voiced understanding.

## 2015-12-14 ENCOUNTER — Encounter: Payer: Self-pay | Admitting: Family Medicine

## 2015-12-21 ENCOUNTER — Encounter: Payer: Self-pay | Admitting: Family Medicine

## 2015-12-22 ENCOUNTER — Other Ambulatory Visit: Payer: Self-pay | Admitting: *Deleted

## 2015-12-22 MED ORDER — HYDROMORPHONE HCL 2 MG PO TABS: 2.0000 mg | ORAL_TABLET | ORAL | 0 refills | Status: DC | PRN

## 2015-12-22 MED ORDER — ONDANSETRON 4 MG PO TBDP: 4.0000 mg | ORAL_TABLET | Freq: Three times a day (TID) | ORAL | 3 refills | Status: DC | PRN

## 2015-12-22 MED ORDER — HYDROCODONE-ACETAMINOPHEN 10-325 MG PO TABS: 1.0000 | ORAL_TABLET | Freq: Four times a day (QID) | ORAL | 0 refills | Status: DC | PRN

## 2015-12-22 NOTE — Telephone Encounter (Signed)
Left detailed message on voicemail. Rx left at front desk for pick up.  

## 2015-12-22 NOTE — Telephone Encounter (Signed)
Needed time to get the refills done.   Printed.  Thanks.

## 2015-12-22 NOTE — Telephone Encounter (Signed)
Patent left a voicemail stating that he sent you a my chart message and has not heard back from you. Patient stated that he also needs refills on 2 of his medications Dilaudid last refill 11/12/15 #30 Hydrocodone Last refill 11/12/15 #120 Last office visit 10/29/15 Call when ready for pickup

## 2015-12-29 ENCOUNTER — Other Ambulatory Visit: Payer: Self-pay | Admitting: *Deleted

## 2015-12-29 NOTE — Telephone Encounter (Signed)
Received fax from [facility] requesting #180 tablets (90 day supply), saying that patient takes 2 per day.  Our prescription says 1 per day.  Please advise.

## 2015-12-29 NOTE — Telephone Encounter (Signed)
Pt called to ck on status and verified it is 1 per day.

## 2015-12-30 MED ORDER — ESCITALOPRAM OXALATE 20 MG PO TABS: 20.0000 mg | ORAL_TABLET | Freq: Every day | ORAL | 1 refills | Status: DC

## 2015-12-30 NOTE — Telephone Encounter (Signed)
Sent. Thanks.   

## 2015-12-30 NOTE — Telephone Encounter (Signed)
Patient notified by telephone that script has been sent in per Dr. Duncan. 

## 2016-01-19 ENCOUNTER — Telehealth: Payer: Self-pay

## 2016-01-19 NOTE — Telephone Encounter (Signed)
PLEASE NOTE: All timestamps contained within this report are represented as [place] Standard Time. CONFIDENTIALTY NOTICE: This fax transmission is intended only for the addressee. It contains information that is legally privileged, confidential or otherwise protected from use or disclosure. If you are not the intended recipient, you are strictly prohibited from reviewing, disclosing, copying using or disseminating any of this information or taking any action in reliance on or regarding this information. If you have received this fax in error, please notify us immediately by telephone so that we can arrange for its return to [place]. Phone: [phone], Toll-Free: [phone], Fax: [phone] Page: 1 of 2 Call Id: [id number] [place] Patient Name: Casey Caldwell Gender: Male DOB: [birth year] Age: 35 Y 43 M 17 D Return Phone Number: [phone] (Primary) Address: City/State/Zip: [place]  [address] Client [facility] - Client Client Site [place] - Day Physician Renford Dills - MD Contact Type Call Who Is Calling Patient / Member / Family / Caregiver Call Type Triage / Clinical Relationship To Patient Self Return Phone Number [phone] (Primary) Chief Complaint Sore Throat Reason for Call Symptomatic / Request for [place] states he has a cold, coughing, sore throat, congestion, and fever for only one night. Alot of phlegm coming up. Appointment Disposition EMR Appointment Not Necessary Info pasted into Epic No PreDisposition Call a family member Translation No Nurse Assessment Nurse: Sherren Mocha, RN, Pam Date/Time (Eastern Time): 01/19/2016 3:10:27 PM Confirm and document reason for call. If symptomatic, describe symptoms. ---Caller states he has a cold, coughing, sore throat, congestion, and fever for only one night.  Alot of phlegm coming up. No temp today. Does the patient have any new or worsening symptoms? ---Yes Will a triage be completed? ---Yes Related visit to physician within the last 2 weeks? ---No Does the PT have any chronic conditions? (i.e. diabetes, asthma, etc.) ---No Is this a behavioral health or substance abuse call? ---No Guidelines Guideline Title Affirmed Question Affirmed Notes Nurse Date/Time (Eastern Time) Cough - Acute Productive [1] Continuous (nonstop) coughing interferes with work or school AND [2] no improvement using cough treatment per Care Advice Sherren Mocha, RN, Pam 01/19/2016 3:11:55 PM Disp. Time Eilene Ghazi Time) Disposition Final User 01/19/2016 3:09:10 PM Send To Clinical Follow Up Milinda Hirschfeld, McKenzie Tyler PLEASE NOTE: All timestamps contained within this report are represented as [place] Standard Time. CONFIDENTIALTY NOTICE: This fax transmission is intended only for the addressee. It contains information that is legally privileged, confidential or otherwise protected from use or disclosure. If you are not the intended recipient, you are strictly prohibited from reviewing, disclosing, copying using or disseminating any of this information or taking any action in reliance on or regarding this information. If you have received this fax in error, please notify us immediately by telephone so that we can arrange for its return to [place]. Phone: [phone], Toll-Free: [phone], Fax: [phone] Page: 2 of 2 Call Id: [id number] 01/19/2016 3:18:06 PM See Physician within 24 Hours Yes Sherren Mocha, RN, Gerrit Heck Understands: Yes Disagree/Comply: Comply Care Advice Given Per Guideline SEE PHYSICIAN WITHIN 24 HOURS: * IF OFFICE WILL BE OPEN: You need to be seen within the next 24 hours. Call your doctor when the office opens, and make an appointment. COUGH MEDICINES: - HOME REMEDY - HARD CANDY: Hard candy works just as well as medicine-flavored OTC cough drops. Diabetics should  use sugar-free candy. - HOME  REMEDY - HONEY: This old home remedy has been shown to help decrease coughing at night. The adult dosage is 2 teaspoons (10 ml) at bedtime. Honey should not be given to infants under one year of age. * Research Notes: Dextromethorphan in some research studies has been shown to reduce the frequency and severity of cough in adults (18 years or older) without significant adverse effects. However, other studies suggest that dextromethorphan is no better than placebo at reducing a cough. - Drug Abuse Potential: It should be noted that dextromethorphan has become a drug of abuse. This problem is seen most often in adolescents. Overdose symptoms can range from giggling and euphoria to hallucinations and coma. HUMIDIFIER: If the air is dry, use a humidifier in the bedroom. (Reason: dry air makes coughs worse) AVOID TOBACCO SMOKE: Smoking or being exposed to smoke makes coughs much worse. COUGHING SPELLS: * Drink warm fluids. Inhale warm mist. (Reason: both relax the airway and loosen up the phlegm) * Suck on cough drops or hard candy to coat the irritated throat. CALL BACK IF: * Difficulty breathing occurs * You become worse. CARE ADVICE given per Cough - Acute Productive (Adult) guideline. Referrals REFERRED TO PCP OFFICE

## 2016-01-19 NOTE — Telephone Encounter (Signed)
I called pt x 3; pt having poor phone reception now; on 3rd call I asked pt if he would cb  For appt when he had better reception and pt said yes. FYI to PCP.

## 2016-01-21 NOTE — Telephone Encounter (Signed)
Noted. Thanks.

## 2016-01-22 ENCOUNTER — Other Ambulatory Visit: Payer: Self-pay

## 2016-01-22 MED ORDER — HYDROCODONE-ACETAMINOPHEN 10-325 MG PO TABS: 1.0000 | ORAL_TABLET | Freq: Four times a day (QID) | ORAL | 0 refills | Status: DC | PRN

## 2016-01-22 MED ORDER — HYDROMORPHONE HCL 2 MG PO TABS: 2.0000 mg | ORAL_TABLET | ORAL | 0 refills | Status: DC | PRN

## 2016-01-22 NOTE — Telephone Encounter (Signed)
Left detailed message on voicemail. Rx left at front desk for pick up.  

## 2016-01-22 NOTE — Telephone Encounter (Signed)
Pt left v/m requesting rx Norco (last printed # 120 on 12/02/15) and Dilaudid (last printed # 30 on 12/22/15). Pt last seen 10/29/15; Pt request to pick up rxs today 01/22/16.

## 2016-01-22 NOTE — Telephone Encounter (Signed)
Printed.  Thanks.  

## 2016-01-26 ENCOUNTER — Emergency Department (HOSPITAL_BASED_OUTPATIENT_CLINIC_OR_DEPARTMENT_OTHER): Payer: Self-pay

## 2016-01-26 ENCOUNTER — Encounter (HOSPITAL_BASED_OUTPATIENT_CLINIC_OR_DEPARTMENT_OTHER): Payer: Self-pay | Admitting: *Deleted

## 2016-01-26 ENCOUNTER — Emergency Department (HOSPITAL_BASED_OUTPATIENT_CLINIC_OR_DEPARTMENT_OTHER)
Admission: EM | Admit: 2016-01-26 | Discharge: 2016-01-26 | Disposition: A | Discharge status: Home / Self Care | Payer: Self-pay | Attending: Emergency Medicine | Admitting: Emergency Medicine

## 2016-01-26 DIAGNOSIS — R109 Unspecified abdominal pain: Secondary | ICD-10-CM

## 2016-01-26 DIAGNOSIS — Z791 Long term (current) use of non-steroidal anti-inflammatories (NSAID): Secondary | ICD-10-CM | POA: Insufficient documentation

## 2016-01-26 DIAGNOSIS — N50811 Right testicular pain: Secondary | ICD-10-CM | POA: Insufficient documentation

## 2016-01-26 DIAGNOSIS — Z8547 Personal history of malignant neoplasm of testis: Secondary | ICD-10-CM | POA: Insufficient documentation

## 2016-01-26 LAB — CBC WITH DIFFERENTIAL/PLATELET
Basophils Absolute: 0 10*3/uL (ref 0.0–0.1)
Basophils Relative: 0 %
Eosinophils Absolute: 0.1 10*3/uL (ref 0.0–0.7)
Eosinophils Relative: 1 %
HCT: 47.3 % (ref 39.0–52.0)
Hemoglobin: 16.5 g/dL (ref 13.0–17.0)
Lymphocytes Relative: 32 %
Lymphs Abs: 3.1 10*3/uL (ref 0.7–4.0)
MCH: 30.2 pg (ref 26.0–34.0)
MCHC: 34.9 g/dL (ref 30.0–36.0)
MCV: 86.5 fL (ref 78.0–100.0)
Metamyelocytes Relative: 1 %
Monocytes Absolute: 0.6 10*3/uL (ref 0.1–1.0)
Monocytes Relative: 6 %
Neutro Abs: 6 10*3/uL (ref 1.7–7.7)
Neutrophils Relative %: 60 %
Platelets: 342 10*3/uL (ref 150–400)
RBC: 5.47 MIL/uL (ref 4.22–5.81)
RDW: 13.8 % (ref 11.5–15.5)
WBC: 9.8 10*3/uL (ref 4.0–10.5)

## 2016-01-26 LAB — BASIC METABOLIC PANEL
Anion gap: 8 (ref 5–15)
BUN: 10 mg/dL (ref 6–20)
CO2: 27 mmol/L (ref 22–32)
Calcium: 9.3 mg/dL (ref 8.9–10.3)
Chloride: 105 mmol/L (ref 101–111)
Creatinine, Ser: 0.78 mg/dL (ref 0.61–1.24)
GFR calc Af Amer: >60 mL/min (ref >60)
GFR calc non Af Amer: >60 mL/min (ref >60)
Glucose, Bld: 94 mg/dL (ref 65–99)
Potassium: 4.2 mmol/L (ref 3.5–5.1)
Sodium: 140 mmol/L (ref 135–145)

## 2016-01-26 LAB — URINALYSIS, ROUTINE W REFLEX MICROSCOPIC
Bilirubin Urine: NEGATIVE
Glucose, UA: NEGATIVE mg/dL
Hgb urine dipstick: NEGATIVE
Ketones, ur: NEGATIVE mg/dL
Leukocytes, UA: NEGATIVE
Nitrite: NEGATIVE
Protein, ur: NEGATIVE mg/dL
Specific Gravity, Urine: 1.023 (ref 1.005–1.030)
pH: 5 (ref 5.0–8.0)

## 2016-01-26 MED ORDER — KETOROLAC TROMETHAMINE 30 MG/ML IJ SOLN
30.0000 mg | Freq: Once | INTRAMUSCULAR | Status: AC
  Administered 2016-01-26: 30 mg via INTRAVENOUS
  Filled 2016-01-26: qty 1

## 2016-01-26 MED ORDER — ONDANSETRON HCL 4 MG/2ML IJ SOLN
4.0000 mg | Freq: Once | INTRAMUSCULAR | Status: AC
  Administered 2016-01-26: 4 mg via INTRAVENOUS
  Filled 2016-01-26: qty 2

## 2016-01-26 MED ORDER — HYDROMORPHONE HCL 1 MG/ML IJ SOLN
1.0000 mg | Freq: Once | INTRAMUSCULAR | Status: AC
  Administered 2016-01-26: 1 mg via INTRAVENOUS
  Filled 2016-01-26: qty 1

## 2016-01-26 NOTE — ED Notes (Signed)
Pt unable to give UA at this time 

## 2016-01-26 NOTE — ED Notes (Signed)
Patient requesting pain medications - aware that there are no orders at this time

## 2016-01-26 NOTE — ED Notes (Signed)
Patient requesting pain medications. No orders at this time

## 2016-01-26 NOTE — ED Notes (Signed)
Patient transported to Ultrasound 

## 2016-01-26 NOTE — ED Provider Notes (Signed)
[facility] Provider Note   CSN: :[id number] Arrival date & time: 01/26/16  1400     History   Chief Complaint Chief Complaint  Patient presents with  . Flank Pain    HPI Casey Caldwell is a 36 y.o. male.  HPI   36 year old male with history of kidney stones, chronic pain syndrome, polycythemia vera, presenting with flank pain. Patient report having urinary discomfort for the past 3-4 days but developing left flank pain since earlier today. Describe pain as a dull sensation with occasional sharp shooting pain to his left groin and testicle. Pain is recurrent, nothing seems to make it better or worse. Endorse nausea without vomiting. Having mild loose stools. Denies having fever, abdominal pain, burning urination, hematuria, rash. 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 History of recurrent kidney stones and states that the pain feels similar. Patient mentioned he has had several kidney surgery secondary to kidney stones in the past.   Past Medical History:  Diagnosis Date  . Cancer ([place])   . Depression    and panic/anxiety  . GERD (gastroesophageal reflux disease)   . History of kidney stones   . Insomnia   . Kidney stones   . Seizures ([place]) Childhood    Patient Active Problem List   Diagnosis Date Noted  . Chronic pain syndrome 10/30/2015  . Adenomatous colon polyp 06/06/2012  . Nephrolithiasis 03/14/2012  . Polycythemia 03/14/2012  . Groin pain 03/14/2012  . RUQ pain 03/14/2012  . Insomnia 03/14/2012  . Seizure disorder ([place]) 03/14/2012  . Anxiety and depression 03/14/2012    Past Surgical History:  Procedure Laterality Date  . APPENDECTOMY  2011  . HYDROCELE EXCISION Left 06/2012  . LAPAROSCOPIC CHOLECYSTECTOMY  1996  . LITHOTRIPSY     four times over the years; most recent 10/08/12  . ORCHIECTOMY Left 11/2012  . SPINAL CORD STIMULATOR INSERTION  2015  . TONSILLECTOMY  1992  . WRIST GANGLION EXCISION  2002   Right        Home Medications    Prior to Admission medications   Medication Sig Start Date End Date Taking? Authorizing Provider  ARIPiprazole (ABILIFY) 2 MG tablet Take 1 tablet (2 mg total) by mouth daily. 11/03/15   Tonia Ghent, MD  docusate sodium (COLACE) 100 MG capsule Take 1 capsule (100 mg total) by mouth 2 (two) times daily. 09/26/13   Tonia Ghent, MD  escitalopram (LEXAPRO) 20 MG tablet Take 1 tablet (20 mg total) by mouth daily. 12/30/15   Tonia Ghent, MD  HYDROcodone-acetaminophen (NORCO) 10-325 MG tablet Take 1 tablet by mouth every 6 (six) hours as needed. 01/22/16   Tonia Ghent, MD  HYDROmorphone (DILAUDID) 2 MG tablet Take 1 tablet (2 mg total) by mouth every 4 (four) hours as needed for severe pain (for kidney stones). Fill on/after 01/22/16 01/22/16   Tonia Ghent, MD  ibuprofen (ADVIL) 200 MG tablet Take 3 tablets (600 mg total) by mouth 2 (two) times daily as needed. 10/29/15   Tonia Ghent, MD  ondansetron (ZOFRAN ODT) 4 MG disintegrating tablet Take 1 tablet (4 mg total) by mouth every 8 (eight) hours as needed for nausea or vomiting. 12/22/15   Tonia Ghent, MD  polyethylene glycol powder (GLYCOLAX/MIRALAX) powder MIX 17G IN 4 TO 8 OUNCES OF FLUID AND TAKE TWICE DAILY 02/17/15   Tonia Ghent, MD  tamsulosin (FLOMAX) 0.4 MG CAPS capsule Take 1 capsule (0.4 mg total) by  mouth daily. 10/29/15   Tonia Ghent, MD  zolpidem (AMBIEN) 10 MG tablet Take 1 tablet (10 mg total) by mouth at bedtime. 06/11/15   Tonia Ghent, MD    Family History Family History  Problem Relation Age of Onset  . Stroke Mother   . Liver disease Mother   . Irritable bowel syndrome Mother   . Kidney disease Mother   . Heart disease Father   . Colon polyps Father   . Colon cancer Neg Hx     Social History Social History  Substance Use Topics  . Smoking status: Never Smoker  . Smokeless tobacco: Never Used  . Alcohol use 0.0 oz/week     Comment: rarely      Allergies   Phenergan [promethazine hcl]; Tape; Trileptal [oxcarbazepine]; and Wellbutrin [bupropion]   Review of Systems Review of Systems  All other systems reviewed and are negative.    Physical Exam Updated Vital Signs BP (!) 157/120   Pulse 93   Temp 98 F (36.7 C) (Oral)   Resp 20   Ht 6' (1.829 m)   Wt (!) 140.6 kg   SpO2 97%   BMI 42.04 kg/m   Physical Exam  Constitutional: He appears well-developed and well-nourished. No distress.  Obese male laying in a fetal position appears to be uncomfortable.  HENT:  Head: Atraumatic.  Eyes: Conjunctivae are normal.  Neck: Neck supple.  Cardiovascular: Normal rate and regular rhythm.   Pulmonary/Chest: Effort normal and breath sounds normal.  Abdominal: Soft. There is no tenderness.  Genitourinary:  Genitourinary Comments: Left CVA tenderness. Chaperone present during exam. Normal circumcised penis. Abscess of left testicle. Right testicle with exquisite tenderness on palpation but normal scrotum. No inguinal lymphadenopathy or inguinal hernia noted.  Neurological: He is alert.  Skin: No rash noted.  Psychiatric: He has a normal mood and affect.  Nursing note and vitals reviewed.    ED Treatments / Results  Labs (all labs ordered are listed, but only abnormal results are displayed) Labs Reviewed  URINALYSIS, ROUTINE W REFLEX MICROSCOPIC  BASIC METABOLIC PANEL  CBC WITH DIFFERENTIAL/PLATELET    EKG  EKG Interpretation None       Radiology US Scrotum  Result Date: 01/26/2016 CLINICAL DATA:  36 year old male with a history of left orchiectomy for left testicular cancer, presents with right scrotal and left groin pain for 2 days. EXAM: SCROTAL ULTRASOUND DOPPLER ULTRASOUND OF THE TESTICLES TECHNIQUE: Complete ultrasound examination of the testicles, epididymis, and other scrotal structures was performed. Color and spectral Doppler ultrasound were also utilized to evaluate blood flow to the testicles.  COMPARISON:  All 05/18/2015 CT abdomen/ pelvis. 02/08/2015 scrotal sonogram. FINDINGS: Right testicle Measurements: 4.8 x 2.2 x 3.7 cm. No mass or microlithiasis visualized. Left testicle Status post left orchiectomy. No mass or fluid collection demonstrated in the left scrotum or left groin. Right epididymis:  Normal in size and appearance. Hydrocele:  None visualized. Varicocele:  None visualized. Pulsed Doppler interrogation of both testes demonstrates normal low resistance arterial and venous waveforms in the right testis. IMPRESSION: 1. No acute scrotal abnormality. Normal right testis with no evidence of right testicular torsion. Normal right epididymis. 2. Status post left orchiectomy. No abnormal findings demonstrated in the left scrotum or left groin. Electronically Signed   By: Ilona Sorrel M.D.   On: 01/26/2016 16:04   US Renal  Result Date: 01/26/2016 CLINICAL DATA:  Left flank and groin pain for 2 days. History of kidney stones. EXAM: RENAL /  URINARY TRACT ULTRASOUND COMPLETE COMPARISON:  10/07/2015 FINDINGS: Right Kidney: Length: 13.2 cm. Echogenicity within normal limits. No mass or hydronephrosis visualized. No echogenic foci on today's exam to suggest stones. Left Kidney: Length: 13.2 cm. Echogenicity within normal limits. No mass or hydronephrosis visualized. No echogenic foci on today's exam to suggest stones. Bladder: Appears normal for degree of bladder distention. IMPRESSION: Unremarkable exam. Electronically Signed   By: Misty Stanley M.D.   On: 01/26/2016 15:53   [place] Art/ven Flow Abd Pelv Doppler  Result Date: 01/26/2016 CLINICAL DATA:  37 year old male with a history of left orchiectomy for left testicular cancer, presents with right scrotal and left groin pain for 2 days. EXAM: SCROTAL ULTRASOUND DOPPLER ULTRASOUND OF THE TESTICLES TECHNIQUE: Complete ultrasound examination of the testicles, epididymis, and other scrotal structures was performed. Color and spectral Doppler ultrasound  were also utilized to evaluate blood flow to the testicles. COMPARISON:  All 05/18/2015 CT abdomen/ pelvis. 02/08/2015 scrotal sonogram. FINDINGS: Right testicle Measurements: 4.8 x 2.2 x 3.7 cm. No mass or microlithiasis visualized. Left testicle Status post left orchiectomy. No mass or fluid collection demonstrated in the left scrotum or left groin. Right epididymis:  Normal in size and appearance. Hydrocele:  None visualized. Varicocele:  None visualized. Pulsed Doppler interrogation of both testes demonstrates normal low resistance arterial and venous waveforms in the right testis. IMPRESSION: 1. No acute scrotal abnormality. Normal right testis with no evidence of right testicular torsion. Normal right epididymis. 2. Status post left orchiectomy. No abnormal findings demonstrated in the left scrotum or left groin. Electronically Signed   By: Ilona Sorrel M.D.   On: 01/26/2016 16:04    Procedures Procedures (including critical care time)  Medications Ordered in ED Medications  HYDROmorphone (DILAUDID) injection 1 mg (1 mg Intravenous Given 01/26/16 1512)  ondansetron (ZOFRAN) injection 4 mg (4 mg Intravenous Given 01/26/16 1512)  ketorolac (TORADOL) 30 MG/ML injection 30 mg (30 mg Intravenous Given 01/26/16 1644)     Initial Impression / Assessment and Plan / ED Course  I have reviewed the triage vital signs and the nursing notes.  Pertinent labs & imaging results that were available during my care of the patient were reviewed by me and considered in my medical decision making (see chart for details).  Clinical Course     BP (!) 157/120   Pulse 93   Temp 98 F (36.7 C) (Oral)   Resp 20   Ht 6' (1.829 m)   Wt (!) 140.6 kg   SpO2 97%   BMI 42.04 kg/m    Final Clinical Impressions(s) / ED Diagnoses   Final diagnoses:  Testicular pain, right  Left flank pain    New Prescriptions New Prescriptions   No medications on file   3:00 PM Patient with history of recurrent kidney  stones who has been seen multiple times in the ER for similar pain. He is here with left flank pain and pain to his right testicle. Due to history of recurrent kidney stone, I will obtain a renal ultrasound to rule out hydronephrosis. Anticipate there will be evidence of kidney stones as it has been present during prior ultrasound imaging. Right testicle tenderness which is new, history of testicular cancer in the past therefore will also obtain a scrotal ultrasound. Will check UA, basic labs, provide pain medication and antinausea medication.  4:45 PM Urine shows no signs of infection and no evidence of hematuria. His labs are reassuring, no leukocytosis, no electrolytes imbalance. Renal ultrasound without any  concerning finding. Scrotal ultrasounds are normal as well. Patient has had similar symptoms like this several times past. At this time, no acute finding warranting further imaging or hospitalization. Patient does have a scheduled appointment with his PCP tomorrow which he can follow-up. Toradol given here for his pain. Otherwise he is stable for discharge. Return precaution discussed.   Domenic Moras, PA-C 01/26/16 [place], MD 01/30/16 1710

## 2016-01-26 NOTE — ED Notes (Signed)
Pain requested pain medications

## 2016-01-26 NOTE — ED Notes (Signed)
Unable to provide a urine sample

## 2016-01-27 ENCOUNTER — Ambulatory Visit (INDEPENDENT_AMBULATORY_CARE_PROVIDER_SITE_OTHER): Payer: BLUE CROSS/BLUE SHIELD | Admitting: Family Medicine

## 2016-01-27 ENCOUNTER — Encounter: Payer: Self-pay | Admitting: Family Medicine

## 2016-01-27 VITALS — BP 130/84 | HR 82 | Temp 98.6°F | Wt 319.0 lb

## 2016-01-27 DIAGNOSIS — N200 Calculus of kidney: Secondary | ICD-10-CM | POA: Diagnosis not present

## 2016-01-27 DIAGNOSIS — R1030 Lower abdominal pain, unspecified: Secondary | ICD-10-CM

## 2016-01-27 DIAGNOSIS — M549 Dorsalgia, unspecified: Secondary | ICD-10-CM

## 2016-01-27 DIAGNOSIS — F329 Major depressive disorder, single episode, unspecified: Secondary | ICD-10-CM

## 2016-01-27 DIAGNOSIS — F419 Anxiety disorder, unspecified: Secondary | ICD-10-CM

## 2016-01-27 DIAGNOSIS — F418 Other specified anxiety disorders: Secondary | ICD-10-CM | POA: Diagnosis not present

## 2016-01-27 DIAGNOSIS — G8929 Other chronic pain: Secondary | ICD-10-CM | POA: Diagnosis not present

## 2016-01-27 DIAGNOSIS — F32A Depression, unspecified: Secondary | ICD-10-CM

## 2016-01-27 MED ORDER — TAMSULOSIN HCL 0.4 MG PO CAPS: 0.4000 mg | ORAL_CAPSULE | Freq: Every day | ORAL | 3 refills | Status: DC

## 2016-01-27 MED ORDER — DOCUSATE SODIUM 100 MG PO CAPS: 100.0000 mg | ORAL_CAPSULE | Freq: Two times a day (BID) | ORAL | 3 refills | Status: DC

## 2016-01-27 MED ORDER — PANTOPRAZOLE SODIUM 40 MG PO TBEC: DELAYED_RELEASE_TABLET | ORAL | 3 refills | Status: DC

## 2016-01-27 MED ORDER — ARIPIPRAZOLE 2 MG PO TABS: 2.0000 mg | ORAL_TABLET | Freq: Every day | ORAL | 3 refills | Status: DC

## 2016-01-27 NOTE — Patient Instructions (Signed)
Check to see which pain clinics are in your network.  Take care.  Glad to see you.  Update me as needed.

## 2016-01-27 NOTE — Progress Notes (Signed)
Pre visit review using our clinic review tool, if applicable. No additional management support is needed unless otherwise documented below in the visit note. 

## 2016-01-27 NOTE — Progress Notes (Signed)
Recent ER eval noted, d/w pt.   He has insurance in the meantime, which is a noted pain.  He needs refill for Flomax, given his history of kidney stones  Still with episodic pain from kidney stones. Still with pain that can radiate down into the groin.  This is thought to originate in his back. He also has radicular pain in the left leg, moving down the left leg in the L4 versus L5 dermatome. He has some numbness in the left. No weakness.  He has spinal cord stimulator previously inserted and this has affected his back pain. He can tell when this is functioning as his pain is improved. He has not had recent follow-up with the pain clinic due to financial difficulties. He has been on chronic opiates. He is managing constipation from the opiates.  Depression. Still on baseline meds. Addition of Abilify was helpful for his mood but he has had more pain overall in the past few weeks which has complicated his situation. No suicidal or homicidal intent. It is still thought that his mood is worsened from the pain and that improvement in his pain situation would likely help his depressive symptoms. The patient, his wife, and I all think that is the case. No adverse effect on current medications.  PMH and SH reviewed  ROS: Per HPI unless specifically indicated in ROS section   Meds, vitals, and allergies reviewed.   nad ncat rrr ctab abd soft, not ttp  Back not ttp in midline but L lower paraspinal muscles tighter, no rash locally.  Decreased, altered sensation on the left lateral thigh. Otherwise sensation grossly intact for the legs bilaterally. No weakness.

## 2016-01-28 ENCOUNTER — Encounter: Payer: Self-pay | Admitting: Family Medicine

## 2016-01-28 DIAGNOSIS — G8929 Other chronic pain: Secondary | ICD-10-CM | POA: Insufficient documentation

## 2016-01-28 NOTE — Assessment & Plan Note (Addendum)
Thought to be due to his back. He can tell when his spinal cord stimulator is effective. I want him to follow-up with pain clinic when possible. He is going to check on his network to see where he would have coverage. I put in referral in the meantime. When he has a list of possible clinics he'll let us know. We'll go from there. No changes opiates in the meantime. No adverse effect on his opiates that would preclude continue use. Routine cautions discussed with patient. He agrees. See above. >25 minutes spent in face to face time with patient, >50% spent in counselling or coordination of care.

## 2016-01-28 NOTE — Assessment & Plan Note (Signed)
Abilify seem to help his mood. Continue as is with Lexapro. Okay for outpatient follow-up. The main issue still appears to be his pain and hopefully we can get that under control with the help of pain clinic to see what else can be done with a spinal cord stimulator. He agrees with plan. He will update me as needed.

## 2016-01-28 NOTE — Assessment & Plan Note (Signed)
Continue Flomax. Use Dilaudid as needed when he has a stone that he needs to pass. Continue liberal fluids.

## 2016-02-01 ENCOUNTER — Telehealth: Payer: Self-pay | Admitting: Family Medicine

## 2016-02-01 NOTE — Telephone Encounter (Signed)
Can you please re-file date of service 01/27/16 with the [employer] insurance? Pt states that he was eligible on this date, but did not have policy information with him at time of appointment. Pt will bring in copy of card once he get it.  Thank you

## 2016-02-02 ENCOUNTER — Telehealth: Payer: Self-pay | Admitting: Family Medicine

## 2016-02-02 DIAGNOSIS — F329 Major depressive disorder, single episode, unspecified: Secondary | ICD-10-CM

## 2016-02-02 DIAGNOSIS — F32A Depression, unspecified: Secondary | ICD-10-CM

## 2016-02-02 NOTE — Telephone Encounter (Signed)
Spoke with patient and sent Referral to [facility] in [place]. Spoke with Rondell Reams she will call the patient to schedule this appt. It could take 2 months to get an appt there. Patient notified and aware.

## 2016-02-02 NOTE — Telephone Encounter (Signed)
Patient advised.

## 2016-02-02 NOTE — Telephone Encounter (Signed)
See info re: pain clinic referral in mychart message.   1. Routed to [facility] re: referral 2. Notify pt.  With mood is worsening, needs psych input.  I put in the referral.  I need psych input re: med options.   3. If any SI/HI, then needs to go to ER.  Thanks.

## 2016-02-03 MED ORDER — ARIPIPRAZOLE 2 MG PO TABS: 4.0000 mg | ORAL_TABLET | Freq: Every day | ORAL | Status: DC

## 2016-02-03 NOTE — Telephone Encounter (Signed)
Patient notified as instructed by telephone and verbalized understanding. Patient stated that he has an appointment with the pain clinic tomorrow. Patient stated that when he hears back from [facility] he will get on the cancellation list if possible.

## 2016-02-03 NOTE — Telephone Encounter (Signed)
Please advise patient about getting on the cancellation list if possible.  Let me know about the status of his pain clinic referral when you have information as this is likely the driving issue for his mood.  I would like update from patient in a few days.  For now, would continue baseline meds but inc abilify to 4mg  a day.  Use the pills he has- 2x2mg  per day at the same time.  If mood continues to get worse in the meantime, then let me now.  Thanks.

## 2016-02-03 NOTE — Addendum Note (Signed)
Addended by: Tonia Ghent on: 02/03/2016 12:55 PM   Modules accepted: Orders

## 2016-02-04 NOTE — Telephone Encounter (Signed)
Thanks

## 2016-02-29 ENCOUNTER — Encounter: Payer: Self-pay | Admitting: Family Medicine

## 2016-03-01 ENCOUNTER — Encounter: Payer: Self-pay | Admitting: Family Medicine

## 2016-03-03 ENCOUNTER — Encounter: Payer: Self-pay | Admitting: Family Medicine

## 2016-03-03 ENCOUNTER — Other Ambulatory Visit: Payer: Self-pay | Admitting: Family Medicine

## 2016-03-03 MED ORDER — HYDROCODONE-ACETAMINOPHEN 10-325 MG PO TABS: 1.0000 | ORAL_TABLET | Freq: Four times a day (QID) | ORAL | 0 refills | Status: DC | PRN

## 2016-03-04 ENCOUNTER — Other Ambulatory Visit: Payer: Self-pay | Admitting: Family Medicine

## 2016-03-04 ENCOUNTER — Encounter: Payer: Self-pay | Admitting: Family Medicine

## 2016-03-04 MED ORDER — HYDROMORPHONE HCL 2 MG PO TABS: 2.0000 mg | ORAL_TABLET | ORAL | 0 refills | Status: DC | PRN

## 2016-03-04 MED ORDER — ARIPIPRAZOLE 5 MG PO TABS: 2.5000 mg | ORAL_TABLET | Freq: Every day | ORAL | 1 refills | Status: DC

## 2016-03-04 NOTE — Addendum Note (Signed)
Addended by: Modena Nunnery on: 03/04/2016 03:13 PM   Modules accepted: Orders

## 2016-03-04 NOTE — Progress Notes (Signed)
Initial rx had old "fill on/after" date; shredded and reprinted with correct date.

## 2016-03-09 ENCOUNTER — Other Ambulatory Visit: Payer: Self-pay | Admitting: Family Medicine

## 2016-03-09 NOTE — Telephone Encounter (Signed)
Ok to refill? Electronically refill request for zolpidem   tablet # 90   with  2 refills  Last prescribed on 06/11/15. Last seen on 01/27/16, no f/u appt's scheduled.

## 2016-03-09 NOTE — Telephone Encounter (Signed)
Rx called to pharmacy as instructed. 

## 2016-03-17 ENCOUNTER — Encounter: Payer: Self-pay | Admitting: Family Medicine

## 2016-03-31 ENCOUNTER — Other Ambulatory Visit: Payer: Self-pay | Admitting: Primary Care

## 2016-04-01 NOTE — Telephone Encounter (Signed)
Ok to refill? Electronically refill request for zolpidem (AMBIEN) 10 MG tablet. Last prescribed by Anda Kraft on 03/09/16. Last seen on 01/27/2016.

## 2016-04-03 ENCOUNTER — Encounter: Payer: Self-pay | Admitting: Family Medicine

## 2016-04-03 NOTE — Telephone Encounter (Signed)
Take care.  Glad to see you.  

## 2016-04-04 ENCOUNTER — Other Ambulatory Visit: Payer: Self-pay

## 2016-04-04 NOTE — Telephone Encounter (Signed)
Rx called to pharmacy as instructed. 

## 2016-04-04 NOTE — Telephone Encounter (Signed)
This was sent to me and I was already out of the office for a 1 pm closing due to weather  I will cc to PCP and let him know I will take care of this when we open tomorrow at 10  Sending back to myself as a reminder

## 2016-04-04 NOTE — Telephone Encounter (Signed)
Pt left v/m requesting rx hydrocodone apap. Call when ready for pick up. Last printed #120 on 03/03/16. Last seen 01/27/16. Pt wants to pick up rx as soon as possible.

## 2016-04-05 MED ORDER — HYDROCODONE-ACETAMINOPHEN 10-325 MG PO TABS: 1.0000 | ORAL_TABLET | Freq: Four times a day (QID) | ORAL | 0 refills | Status: DC | PRN

## 2016-04-05 NOTE — Telephone Encounter (Signed)
I will refill once in pcp absence Rev his last note re : pain medication  Px printed for pick up in IN box

## 2016-04-05 NOTE — Telephone Encounter (Signed)
Left voicemail letting pt know Rx ready for pick up 

## 2016-04-06 NOTE — Telephone Encounter (Signed)
Thanks

## 2016-04-07 ENCOUNTER — Other Ambulatory Visit: Payer: Self-pay | Admitting: Family Medicine

## 2016-04-07 MED ORDER — HYDROMORPHONE HCL 2 MG PO TABS: 2.0000 mg | ORAL_TABLET | ORAL | 0 refills | Status: DC | PRN

## 2016-04-07 MED ORDER — ZOLPIDEM TARTRATE 10 MG PO TABS: 10.0000 mg | ORAL_TABLET | Freq: Every evening | ORAL | 0 refills | Status: DC | PRN

## 2016-04-26 ENCOUNTER — Emergency Department (HOSPITAL_BASED_OUTPATIENT_CLINIC_OR_DEPARTMENT_OTHER)
Admission: EM | Admit: 2016-04-26 | Discharge: 2016-04-26 | Disposition: A | Discharge status: Home / Self Care | Payer: Self-pay | Attending: Emergency Medicine | Admitting: Emergency Medicine

## 2016-04-26 ENCOUNTER — Emergency Department (HOSPITAL_BASED_OUTPATIENT_CLINIC_OR_DEPARTMENT_OTHER): Payer: Self-pay

## 2016-04-26 ENCOUNTER — Encounter (HOSPITAL_BASED_OUTPATIENT_CLINIC_OR_DEPARTMENT_OTHER): Payer: Self-pay

## 2016-04-26 DIAGNOSIS — M545 Low back pain, unspecified: Secondary | ICD-10-CM

## 2016-04-26 DIAGNOSIS — R1032 Left lower quadrant pain: Secondary | ICD-10-CM | POA: Insufficient documentation

## 2016-04-26 DIAGNOSIS — G8929 Other chronic pain: Secondary | ICD-10-CM | POA: Insufficient documentation

## 2016-04-26 LAB — BASIC METABOLIC PANEL
Anion gap: 10 (ref 5–15)
BUN: 14 mg/dL (ref 6–20)
CO2: 27 mmol/L (ref 22–32)
Calcium: 9.3 mg/dL (ref 8.9–10.3)
Chloride: 103 mmol/L (ref 101–111)
Creatinine, Ser: 1.04 mg/dL (ref 0.61–1.24)
GFR calc Af Amer: >60 mL/min (ref >60)
GFR calc non Af Amer: >60 mL/min (ref >60)
Glucose, Bld: 94 mg/dL (ref 65–99)
Potassium: 3.9 mmol/L (ref 3.5–5.1)
Sodium: 140 mmol/L (ref 135–145)

## 2016-04-26 LAB — CBC WITH DIFFERENTIAL/PLATELET
Basophils Absolute: 0 10*3/uL (ref 0.0–0.1)
Basophils Relative: 0 %
Eosinophils Absolute: 0.1 10*3/uL (ref 0.0–0.7)
Eosinophils Relative: 1 %
HCT: 49.1 % (ref 39.0–52.0)
Hemoglobin: 17.7 g/dL — ABNORMAL HIGH (ref 13.0–17.0)
Lymphocytes Relative: 29 %
Lymphs Abs: 2.5 10*3/uL (ref 0.7–4.0)
MCH: 31 pg (ref 26.0–34.0)
MCHC: 36 g/dL (ref 30.0–36.0)
MCV: 86 fL (ref 78.0–100.0)
Monocytes Absolute: 0.9 10*3/uL (ref 0.1–1.0)
Monocytes Relative: 11 %
Neutro Abs: 5 10*3/uL (ref 1.7–7.7)
Neutrophils Relative %: 59 %
Platelets: 303 10*3/uL (ref 150–400)
RBC: 5.71 MIL/uL (ref 4.22–5.81)
RDW: 14.3 % (ref 11.5–15.5)
WBC: 8.5 10*3/uL (ref 4.0–10.5)

## 2016-04-26 LAB — URINALYSIS, ROUTINE W REFLEX MICROSCOPIC
Bilirubin Urine: NEGATIVE
Glucose, UA: NEGATIVE mg/dL
Hgb urine dipstick: NEGATIVE
Ketones, ur: 40 mg/dL — AB
Leukocytes, UA: NEGATIVE
Nitrite: NEGATIVE
Protein, ur: NEGATIVE mg/dL
Specific Gravity, Urine: 1.026 (ref 1.005–1.030)
pH: 5 (ref 5.0–8.0)

## 2016-04-26 MED ORDER — KETOROLAC TROMETHAMINE 30 MG/ML IJ SOLN
30.0000 mg | Freq: Once | INTRAMUSCULAR | Status: AC
  Administered 2016-04-26: 30 mg via INTRAVENOUS
  Filled 2016-04-26: qty 1

## 2016-04-26 MED ORDER — LIDO-CAPSAICIN-MEN-METHYL SAL 0.5-0.035-5-20 % EX PTCH: 1.0000 | MEDICATED_PATCH | Freq: Every day | CUTANEOUS | 0 refills | Status: DC | PRN

## 2016-04-26 MED ORDER — ACETAMINOPHEN 500 MG PO TABS
1000.0000 mg | ORAL_TABLET | Freq: Once | ORAL | Status: AC
  Administered 2016-04-26: 1000 mg via ORAL
  Filled 2016-04-26: qty 2

## 2016-04-26 MED ORDER — SODIUM CHLORIDE 0.9 % IV BOLUS (SEPSIS)
1000.0000 mL | Freq: Once | INTRAVENOUS | Status: AC
  Administered 2016-04-26: 1000 mL via INTRAVENOUS

## 2016-04-26 MED ORDER — FENTANYL CITRATE (PF) 100 MCG/2ML IJ SOLN
50.0000 ug | INTRAMUSCULAR | Status: DC | PRN
  Administered 2016-04-26: 50 ug via INTRAVENOUS
  Filled 2016-04-26: qty 2

## 2016-04-26 MED ORDER — BUPIVACAINE HCL (PF) 0.5 % IJ SOLN
10.0000 mL | Freq: Once | INTRAMUSCULAR | Status: AC
  Administered 2016-04-26: 10 mL
  Filled 2016-04-26: qty 10

## 2016-04-26 MED ORDER — ONDANSETRON HCL 4 MG/2ML IJ SOLN
4.0000 mg | INTRAMUSCULAR | Status: DC | PRN
  Administered 2016-04-26: 4 mg via INTRAVENOUS
  Filled 2016-04-26 (×2): qty 2

## 2016-04-26 MED ORDER — LIDOCAINE 5 % EX PTCH
1.0000 | MEDICATED_PATCH | CUTANEOUS | Status: DC
  Filled 2016-04-26: qty 1

## 2016-04-26 NOTE — ED Triage Notes (Signed)
c/o left flank/groin pain x "couple hours"-states feels like "kidney stone pain"

## 2016-04-26 NOTE — ED Notes (Signed)
Pt given urinal to attempt urine sample. Pt said he could try.

## 2016-04-26 NOTE — ED Notes (Signed)
Patient transported to X-ray 

## 2016-04-26 NOTE — ED Provider Notes (Signed)
[facility] DEPT [facility] Provider Note   CSN: [id number] Arrival date & time: 04/26/16  1509     History   Chief Complaint Chief Complaint  Patient presents with  . Flank Pain    HPI Casey Caldwell is a 36 y.o. male.  The history is provided by the patient.  Flank Pain  This is a chronic problem. Episode onset: worse today but ongoing for multiple weeks. The problem occurs constantly. The problem has been gradually worsening. Associated symptoms include abdominal pain (in left groin). Pertinent negatives include no chest pain. Nothing aggravates the symptoms. Nothing relieves the symptoms. He has tried nothing for the symptoms.    Past Medical History:  Diagnosis Date  . Cancer ([place])   . Depression    and panic/anxiety  . GERD (gastroesophageal reflux disease)   . History of kidney stones   . Insomnia   . Kidney stones   . Seizures ([place]) Childhood    Patient Active Problem List   Diagnosis Date Noted  . Encounter for chronic pain management 01/28/2016  . Chronic pain syndrome 10/30/2015  . Adenomatous colon polyp 06/06/2012  . Nephrolithiasis 03/14/2012  . Polycythemia 03/14/2012  . Groin pain 03/14/2012  . RUQ pain 03/14/2012  . Insomnia 03/14/2012  . Seizure disorder ([place]) 03/14/2012  . Anxiety and depression 03/14/2012    Past Surgical History:  Procedure Laterality Date  . APPENDECTOMY  2011  . HYDROCELE EXCISION Left 06/2012  . LAPAROSCOPIC CHOLECYSTECTOMY  1996  . LITHOTRIPSY     four times over the years; most recent 10/08/12  . ORCHIECTOMY Left 11/2012  . SPINAL CORD STIMULATOR INSERTION  2015  . TONSILLECTOMY  1992  . WRIST GANGLION EXCISION  2002   Right       Home Medications    Prior to Admission medications   Medication Sig Start Date End Date Taking? Authorizing Provider  ARIPiprazole (ABILIFY) 5 MG tablet Take 0.5 tablets (2.5 mg total) by mouth daily. 03/04/16   Tonia Ghent, MD  docusate sodium (COLACE) 100 MG capsule Take 1 capsule  (100 mg total) by mouth 2 (two) times daily. 01/27/16   Tonia Ghent, MD  escitalopram (LEXAPRO) 20 MG tablet Take 1 tablet (20 mg total) by mouth daily. 12/30/15   Tonia Ghent, MD  HYDROcodone-acetaminophen (NORCO) 10-325 MG tablet Take 1 tablet by mouth every 6 (six) hours as needed. 04/05/16   Abner Greenspan, MD  HYDROmorphone (DILAUDID) 2 MG tablet Take 1 tablet (2 mg total) by mouth every 4 (four) hours as needed for severe pain (for kidney stones). Fill on/after 04/07/16 04/07/16   Tonia Ghent, MD  ibuprofen (ADVIL) 200 MG tablet Take 3 tablets (600 mg total) by mouth 2 (two) times daily as needed. 10/29/15   Tonia Ghent, MD  ondansetron (ZOFRAN ODT) 4 MG disintegrating tablet Take 1 tablet (4 mg total) by mouth every 8 (eight) hours as needed for nausea or vomiting. 12/22/15   Tonia Ghent, MD  pantoprazole (PROTONIX) 40 MG tablet TAKE ONE TABLET BY MOUTH TWICE DAILY 01/27/16   Tonia Ghent, MD  polyethylene glycol powder (GLYCOLAX/MIRALAX) powder MIX 17G IN 4 TO 8 OUNCES OF FLUID AND TAKE TWICE DAILY 02/17/15   Tonia Ghent, MD  tamsulosin (FLOMAX) 0.4 MG CAPS capsule Take 1 capsule (0.4 mg total) by mouth daily. 01/27/16   Tonia Ghent, MD  zolpidem (AMBIEN) 10 MG tablet Take 1 tablet (10 mg total) by mouth at bedtime  as needed. 04/07/16   Tonia Ghent, MD    Family History Family History  Problem Relation Age of Onset  . Stroke Mother   . Liver disease Mother   . Irritable bowel syndrome Mother   . Kidney disease Mother   . Heart disease Father   . Colon polyps Father   . Colon cancer Neg Hx     Social History Social History  Substance Use Topics  . Smoking status: Never Smoker  . Smokeless tobacco: Never Used  . Alcohol use 0.0 oz/week     Comment: rarely     Allergies   Phenergan [promethazine hcl]; Tape; Trileptal [oxcarbazepine]; and Wellbutrin [bupropion]   Review of Systems Review of Systems  Cardiovascular: Negative for chest pain.    Gastrointestinal: Positive for abdominal pain (in left groin).  Genitourinary: Positive for flank pain.  All other systems reviewed and are negative.    Physical Exam Updated Vital Signs BP (!) 147/99 (BP Location: Left Arm)   Pulse 91   Temp 98.2 F (36.8 C) (Oral)   Resp 20   Ht 6' (1.829 m)   Wt 300 lb (136.1 kg)   SpO2 97%   BMI 40.69 kg/m   Physical Exam  Constitutional: He is oriented to person, place, and time. He appears well-developed and well-nourished. No distress.  HENT:  Head: Normocephalic and atraumatic.  Nose: Nose normal.  Eyes: Conjunctivae are normal.  Neck: Neck supple. No tracheal deviation present.  Cardiovascular: Normal rate, regular rhythm and normal heart sounds.   Pulmonary/Chest: Effort normal and breath sounds normal. No respiratory distress.  Abdominal: Soft. He exhibits no distension. There is tenderness (LLQ and left CVA).  Genitourinary: Penis normal.  Genitourinary Comments: Left testicle absent. Right testicle with normal lie, no tenderness, positive cremasteric reflex. He has pain at the left inguinal canal without hernia palpable  Neurological: He is alert and oriented to person, place, and time.  Skin: Skin is warm and dry.  Psychiatric: He has a normal mood and affect.  Vitals reviewed.    ED Treatments / Results  Labs (all labs ordered are listed, but only abnormal results are displayed) Labs Reviewed  URINALYSIS, ROUTINE W REFLEX MICROSCOPIC - Abnormal; Notable for the following:       Result Value   Ketones, ur 40 (*)    All other components within normal limits  CBC WITH DIFFERENTIAL/PLATELET - Abnormal; Notable for the following:    Hemoglobin 17.7 (*)    All other components within normal limits  URINE CULTURE  BASIC METABOLIC PANEL    EKG  EKG Interpretation None       Radiology Dg Abdomen 1 View  Result Date: 04/26/2016 CLINICAL DATA:  LEFT groin pain for a few hours. Possible kidney stone, history of  kidney stones and lithotripsy. EXAM: ABDOMEN - 1 VIEW COMPARISON:  CT abdomen and pelvis May 18, 2015 FINDINGS: The bowel gas pattern is normal. No radio-opaque calculi or other significant radiographic abnormality are seen. Spinal stimulator projects RIGHT abdomen. Phleboliths RIGHT pelvis. IMPRESSION: Negative. Electronically Signed   By: Elon Alas M.D.   On: 04/26/2016 16:12   US Renal  Result Date: 04/26/2016 CLINICAL DATA:  Left flank and groin pain EXAM: RENAL / URINARY TRACT ULTRASOUND COMPLETE COMPARISON:  01/26/2016, 10/07/2015 FINDINGS: Right Kidney: Length: 12.6 cm. Echogenicity within normal limits. No mass or hydronephrosis visualized. Left Kidney: Length: 13.8 cm. Echogenicity within normal limits. No mass or hydronephrosis visualized. Bladder: Appears normal for degree of  bladder distention. IMPRESSION: Negative renal ultrasound Electronically Signed   By: Donavan Foil M.D.   On: 04/26/2016 18:25    Procedures Procedures (including critical care time) Procedure Note: Trigger Point Injection for Myofascial pain  Performed by Dr. Laneta Simmers Indication: muscle/myofascial pain Muscle body and tendon sheath of the left lumbar paraspinal muscle(s) were injected with 0.5% bupivacaine under sterile technique for release of muscle spasm/pain. Patient tolerated well with immediate improvement of symptoms and no immediate complications following procedure.  CPT Code:   1 or 2 muscle bodies: 20552  Medications Ordered in ED Medications  ondansetron (ZOFRAN) injection 4 mg (4 mg Intravenous Given 04/26/16 1705)  ketorolac (TORADOL) 30 MG/ML injection 30 mg (30 mg Intravenous Given 04/26/16 1705)  sodium chloride 0.9 % bolus 1,000 mL (0 mLs Intravenous Stopped 04/26/16 1829)  bupivacaine (MARCAINE) 0.5 % injection 10 mL (10 mLs Infiltration Given by Other 04/26/16 1705)  acetaminophen (TYLENOL) tablet 1,000 mg (1,000 mg Oral Given 04/26/16 1913)     Initial Impression / Assessment and Plan /  ED Course  I have reviewed the triage vital signs and the nursing notes.  Pertinent labs & imaging results that were available during my care of the patient were reviewed by me and considered in my medical decision making (see chart for details).     36 y.o. male presents with left flank and groin pain that is chronic but worsened over the last day. He has hostory of radio-opaque stones as well as uric acid stones. Multiple CTs in last year so will avoid this today. Plain film without radio-opaque stones and no obstruction on [place]. No hematuria on UA. Do not suspect this is related to kidney stone as he has had similar previous pain with negative imaging  d this is atypical. He is on dilaudid and norco at home that has not helped. No signs of torsion with normal testicular exam of remaining testicle. He was provided trigger point injection of back which improved symptoms and we discussed lidocaine patches as narcotic analgesia does not appear to be helping here or at home. Plan to follow up with PCP as needed and return precautions discussed for worsening or new concerning symptoms.   Final Clinical Impressions(s) / ED Diagnoses   Final diagnoses:  Chronic left-sided low back pain without sciatica  Left groin pain    New Prescriptions Discharge Medication List as of 04/26/2016  7:30 PM       Leo Grosser, MD 04/27/16 0045

## 2016-04-27 LAB — URINE CULTURE: Culture: NO GROWTH

## 2016-05-02 ENCOUNTER — Encounter: Payer: Self-pay | Admitting: Family Medicine

## 2016-05-06 ENCOUNTER — Other Ambulatory Visit: Payer: Self-pay | Admitting: Family Medicine

## 2016-05-06 MED ORDER — HYDROMORPHONE HCL 2 MG PO TABS: 2.0000 mg | ORAL_TABLET | ORAL | 0 refills | Status: DC | PRN

## 2016-05-06 MED ORDER — HYDROCODONE-ACETAMINOPHEN 10-325 MG PO TABS: 1.0000 | ORAL_TABLET | Freq: Four times a day (QID) | ORAL | 0 refills | Status: DC | PRN

## 2016-06-01 ENCOUNTER — Encounter: Payer: Self-pay | Admitting: Family Medicine

## 2016-06-07 ENCOUNTER — Other Ambulatory Visit: Payer: Self-pay

## 2016-06-07 NOTE — Telephone Encounter (Signed)
Pt left v/m requesting status of refill request; I do not see a refill request and left v/m for pt to cb with name of med and what he needs refilled.

## 2016-06-07 NOTE — Telephone Encounter (Signed)
Pt left v/m that he had previously emailed Dr Damita Dunnings about refill on hydrocodone apap (last printed # 120 on 05/06/16) and hydromorphone (last printed # 30 on 05/06/16). Pt last seen 01/27/16. Pt request cb when rx ready for pick up.

## 2016-06-08 MED ORDER — HYDROCODONE-ACETAMINOPHEN 10-325 MG PO TABS: 1.0000 | ORAL_TABLET | Freq: Four times a day (QID) | ORAL | 0 refills | Status: DC | PRN

## 2016-06-08 MED ORDER — HYDROMORPHONE HCL 2 MG PO TABS: 2.0000 mg | ORAL_TABLET | ORAL | 0 refills | Status: DC | PRN

## 2016-06-08 NOTE — Telephone Encounter (Signed)
Mr. Schwarz notified that his prescriptions are ready to be picked up at the front desk.

## 2016-06-08 NOTE — Telephone Encounter (Signed)
ERROR

## 2016-06-08 NOTE — Telephone Encounter (Signed)
Printed.  Thanks.  

## 2016-06-24 ENCOUNTER — Encounter: Payer: Self-pay | Admitting: Family Medicine

## 2016-06-27 ENCOUNTER — Other Ambulatory Visit: Payer: Self-pay | Admitting: Family Medicine

## 2016-06-27 MED ORDER — HYDROMORPHONE HCL 2 MG PO TABS: 2.0000 mg | ORAL_TABLET | ORAL | 0 refills | Status: DC | PRN

## 2016-06-27 MED ORDER — HYDROCODONE-ACETAMINOPHEN 10-325 MG PO TABS: 1.0000 | ORAL_TABLET | Freq: Four times a day (QID) | ORAL | 0 refills | Status: DC | PRN

## 2016-06-27 NOTE — Progress Notes (Signed)
rxs printed.  Thanks.   Patient advised. Rx left at front desk for pick up. Mike Craze, CMA

## 2016-06-28 ENCOUNTER — Other Ambulatory Visit: Payer: Self-pay | Admitting: Family Medicine

## 2016-07-26 ENCOUNTER — Other Ambulatory Visit: Payer: Self-pay

## 2016-07-26 NOTE — Telephone Encounter (Signed)
Casey Caldwell (DPR signed) left v/m requesting refill ambien to [facility] on [address]. Last refilled # 90 on 04/07/16. Pt last seen 01/27/16.

## 2016-07-28 ENCOUNTER — Encounter: Payer: Self-pay | Admitting: Family Medicine

## 2016-07-28 MED ORDER — ZOLPIDEM TARTRATE 10 MG PO TABS
10.0000 mg | ORAL_TABLET | Freq: Every evening | ORAL | 0 refills | Status: DC | PRN
Start: 2016-07-28 — End: 2016-10-21

## 2016-07-28 NOTE — Telephone Encounter (Signed)
Medication phoned to pharmacy.  

## 2016-07-28 NOTE — Telephone Encounter (Signed)
Please call in.  Thanks.   

## 2016-07-31 ENCOUNTER — Other Ambulatory Visit: Payer: Self-pay | Admitting: Family Medicine

## 2016-07-31 MED ORDER — HYDROMORPHONE HCL 2 MG PO TABS: 2.0000 mg | ORAL_TABLET | ORAL | 0 refills | Status: DC | PRN

## 2016-07-31 MED ORDER — HYDROCODONE-ACETAMINOPHEN 10-325 MG PO TABS: 1.0000 | ORAL_TABLET | Freq: Four times a day (QID) | ORAL | 0 refills | Status: DC | PRN

## 2016-08-01 ENCOUNTER — Telehealth: Payer: Self-pay | Admitting: Family Medicine

## 2016-08-01 NOTE — Telephone Encounter (Signed)
Pt called about his rx - he is wanting his dad to be able to pick up. Pt is away for work until October and can not come in. Please advise pt if this is ok.

## 2016-08-01 NOTE — Telephone Encounter (Signed)
Okay with me as long as we have his name ahead of time and he brings ID.  Thanks.

## 2016-08-02 NOTE — Telephone Encounter (Signed)
Left detailed message on voicemail.  

## 2016-08-19 ENCOUNTER — Emergency Department (HOSPITAL_BASED_OUTPATIENT_CLINIC_OR_DEPARTMENT_OTHER): Payer: Self-pay

## 2016-08-19 ENCOUNTER — Encounter (HOSPITAL_BASED_OUTPATIENT_CLINIC_OR_DEPARTMENT_OTHER): Payer: Self-pay

## 2016-08-19 ENCOUNTER — Emergency Department (HOSPITAL_BASED_OUTPATIENT_CLINIC_OR_DEPARTMENT_OTHER)
Admission: EM | Admit: 2016-08-19 | Discharge: 2016-08-19 | Disposition: A | Discharge status: Home / Self Care | Payer: Self-pay | Attending: Emergency Medicine | Admitting: Emergency Medicine

## 2016-08-19 DIAGNOSIS — R110 Nausea: Secondary | ICD-10-CM | POA: Insufficient documentation

## 2016-08-19 DIAGNOSIS — R109 Unspecified abdominal pain: Secondary | ICD-10-CM

## 2016-08-19 DIAGNOSIS — R197 Diarrhea, unspecified: Secondary | ICD-10-CM | POA: Insufficient documentation

## 2016-08-19 DIAGNOSIS — Z79899 Other long term (current) drug therapy: Secondary | ICD-10-CM | POA: Insufficient documentation

## 2016-08-19 DIAGNOSIS — N200 Calculus of kidney: Secondary | ICD-10-CM | POA: Insufficient documentation

## 2016-08-19 LAB — URINALYSIS, ROUTINE W REFLEX MICROSCOPIC
Bilirubin Urine: NEGATIVE
Glucose, UA: NEGATIVE mg/dL
Ketones, ur: NEGATIVE mg/dL
Leukocytes, UA: NEGATIVE
Nitrite: NEGATIVE
Protein, ur: NEGATIVE mg/dL
Specific Gravity, Urine: 1.022 (ref 1.005–1.030)
pH: 5.5 (ref 5.0–8.0)

## 2016-08-19 LAB — URINALYSIS, MICROSCOPIC (REFLEX): WBC, UA: NONE SEEN WBC/hpf (ref 0–5)

## 2016-08-19 MED ORDER — HYDROMORPHONE HCL 1 MG/ML IJ SOLN
1.0000 mg | Freq: Once | INTRAMUSCULAR | Status: AC
Start: 2016-08-19 — End: 2016-08-19
  Administered 2016-08-19: 1 mg via INTRAVENOUS
  Filled 2016-08-19: qty 1

## 2016-08-19 MED ORDER — ONDANSETRON 4 MG PO TBDP
4.0000 mg | ORAL_TABLET | Freq: Once | ORAL | Status: AC
Start: 2016-08-19 — End: 2016-08-19
  Administered 2016-08-19: 4 mg via ORAL
  Filled 2016-08-19: qty 1

## 2016-08-19 MED ORDER — KETOROLAC TROMETHAMINE 30 MG/ML IJ SOLN
30.0000 mg | Freq: Once | INTRAMUSCULAR | Status: AC
  Administered 2016-08-19: 30 mg via INTRAVENOUS
  Filled 2016-08-19: qty 1

## 2016-08-19 MED ORDER — HYDROMORPHONE HCL 1 MG/ML IJ SOLN
1.0000 mg | Freq: Once | INTRAMUSCULAR | Status: AC
  Administered 2016-08-19: 1 mg via INTRAVENOUS
  Filled 2016-08-19: qty 1

## 2016-08-19 MED ORDER — TAMSULOSIN HCL 0.4 MG PO CAPS: 0.4000 mg | ORAL_CAPSULE | Freq: Every day | ORAL | 0 refills | Status: DC

## 2016-08-19 MED ORDER — ONDANSETRON HCL 4 MG/2ML IJ SOLN
4.0000 mg | Freq: Once | INTRAMUSCULAR | Status: AC
  Administered 2016-08-19: 4 mg via INTRAVENOUS
  Filled 2016-08-19: qty 2

## 2016-08-19 NOTE — ED Notes (Signed)
Patient transported to CT 

## 2016-08-19 NOTE — ED Notes (Signed)
ED Provider at bedside. 

## 2016-08-19 NOTE — ED Provider Notes (Signed)
[facility] DEPT Provider Note   CSN: [id number] Arrival date & time: 08/19/16  1903     History   Chief Complaint Chief Complaint  Patient presents with  . Flank Pain    HPI Casey Caldwell is a 36 y.o. male with past medical history of kidney stones who presents with 2 days of left flank pain that has progressively worsened. Patient reports that pain is radiating from his left flank down into done to the left lower abdomen into the groin. He reports that he has had a history of kidney stones and states that current symptoms feel very similar to past episodes of kidney stones. He reports that pain is constant and he has difficulty finding a comfortable position. He reports that if he lays on his side that minimally helped the pain but denies any other alleviating factors. He reports associated nausea. He denies any vomiting and states that he has been able to tolerate by mouth. He does report that starting yesterday he started having some loose stools. He denies any fevers, chest pain, difficulty breathing.  The history is provided by the patient.    Past Medical History:  Diagnosis Date  . Cancer ([place])   . Depression    and panic/anxiety  . GERD (gastroesophageal reflux disease)   . History of kidney stones   . Insomnia   . Kidney stones   . Seizures ([place]) Childhood    Patient Active Problem List   Diagnosis Date Noted  . Encounter for chronic pain management 01/28/2016  . Chronic pain syndrome 10/30/2015  . Adenomatous colon polyp 06/06/2012  . Nephrolithiasis 03/14/2012  . Polycythemia 03/14/2012  . Groin pain 03/14/2012  . RUQ pain 03/14/2012  . Insomnia 03/14/2012  . Seizure disorder ([place]) 03/14/2012  . Anxiety and depression 03/14/2012    Past Surgical History:  Procedure Laterality Date  . APPENDECTOMY  2011  . HYDROCELE EXCISION Left 06/2012  . LAPAROSCOPIC CHOLECYSTECTOMY  1996  . LITHOTRIPSY     four times over the years; most recent 10/08/12  .  ORCHIECTOMY Left 11/2012  . SPINAL CORD STIMULATOR INSERTION  2015  . TONSILLECTOMY  1992  . WRIST GANGLION EXCISION  2002   Right       Home Medications    Prior to Admission medications   Medication Sig Start Date End Date Taking? Authorizing Provider  ARIPiprazole (ABILIFY) 5 MG tablet Take 0.5 tablets (2.5 mg total) by mouth daily. 03/04/16   Tonia Ghent, MD  docusate sodium (COLACE) 100 MG capsule Take 1 capsule (100 mg total) by mouth 2 (two) times daily. 01/27/16   Tonia Ghent, MD  escitalopram (LEXAPRO) 20 MG tablet TAKE ONE TABLET BY MOUTH DAILY 06/28/16   Tonia Ghent, MD  HYDROcodone-acetaminophen [facility]) 10-325 MG tablet Take 1 tablet by mouth every 6 (six) hours as needed. Fill on/after 08/07/16 07/31/16   Tonia Ghent, MD  HYDROmorphone (DILAUDID) 2 MG tablet Take 1 tablet (2 mg total) by mouth every 4 (four) hours as needed for severe pain (for kidney stones). 07/31/16   Tonia Ghent, MD  ibuprofen (ADVIL) 200 MG tablet Take 3 tablets (600 mg total) by mouth 2 (two) times daily as needed. 10/29/15   Tonia Ghent, MD  Lido-Capsaicin-Men-Methyl Sal 0.5-0.035-5-20 % PTCH Apply 1 patch topically daily as needed (pain). 04/26/16   Leo Grosser, MD  ondansetron (ZOFRAN ODT) 4 MG disintegrating tablet Take 1 tablet (4 mg total) by mouth every 8 (eight) hours  as needed for nausea or vomiting. 12/22/15   Tonia Ghent, MD  pantoprazole (PROTONIX) 40 MG tablet TAKE ONE TABLET BY MOUTH TWICE DAILY 01/27/16   Tonia Ghent, MD  polyethylene glycol powder (GLYCOLAX/MIRALAX) powder MIX 17G IN 4 TO 8 OUNCES OF FLUID AND TAKE TWICE DAILY 02/17/15   Tonia Ghent, MD  tamsulosin (FLOMAX) 0.4 MG CAPS capsule Take 1 capsule (0.4 mg total) by mouth daily. 08/19/16   Volanda Napoleon, PA-C  zolpidem (AMBIEN) 10 MG tablet Take 1 tablet (10 mg total) by mouth at bedtime as needed. 07/28/16   Tonia Ghent, MD    Family History Family History  Problem Relation Age of Onset    . Stroke Mother   . Liver disease Mother   . Irritable bowel syndrome Mother   . Kidney disease Mother   . Heart disease Father   . Colon polyps Father   . Colon cancer Neg Hx     Social History Social History  Substance Use Topics  . Smoking status: Never Smoker  . Smokeless tobacco: Never Used  . Alcohol use 0.0 oz/week     Comment: rarely     Allergies   Phenergan [promethazine hcl]; Tape; Trileptal [oxcarbazepine]; and Wellbutrin [bupropion]   Review of Systems Review of Systems  Constitutional: Negative for fever.  Respiratory: Negative for shortness of breath.   Cardiovascular: Negative for chest pain.  Gastrointestinal: Positive for diarrhea and nausea. Negative for abdominal pain and vomiting.  Genitourinary: Positive for flank pain. Negative for dysuria, hematuria, scrotal swelling and testicular pain.  Neurological: Negative for headaches.     Physical Exam Updated Vital Signs BP (!) 148/125 (BP Location: Right Arm)   Pulse 73   Temp 97.8 F (36.6 C) (Oral)   Resp 18   Ht 6' (1.829 m)   Wt 131.5 kg (290 lb)   SpO2 96%   BMI 39.33 kg/m   Physical Exam  Constitutional: He is oriented to person, place, and time. He appears well-developed and well-nourished.  Appears uncomfortable, writhing around in bed  HENT:  Head: Normocephalic and atraumatic.  Mouth/Throat: Oropharynx is clear and moist and mucous membranes are normal.  Eyes: Pupils are equal, round, and reactive to light. Conjunctivae, EOM and lids are normal.  Abdominal: Soft. Normal appearance. There is no tenderness. There is CVA tenderness (Left-sided). There is no rigidity and no guarding.  Genitourinary: Rectum normal, prostate normal and penis normal. Rectal exam shows no mass. Prostate is not tender. Right testis shows no mass, no swelling and no tenderness. Circumcised.  Genitourinary Comments: The exam was performed with a chaperone present. 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 No tenderness palpation to the right testicle. No swelling, discoloration, warmth, erythema of the right testicle. Normal DRE. No evidence of prostate enlargement, tenderness. No rectal mass or fluctuance noted.  Musculoskeletal: Normal range of motion.  Neurological: He is alert and oriented to person, place, and time.  Skin: Skin is warm and dry. Capillary refill takes less than 2 seconds.  Psychiatric: He has a normal mood and affect. His speech is normal.  Nursing note and vitals reviewed.    ED Treatments / Results  Labs (all labs ordered are listed, but only abnormal results are displayed) Labs Reviewed  URINALYSIS, ROUTINE W REFLEX MICROSCOPIC - Abnormal; Notable for the following:       Result Value   APPearance CLOUDY (*)    Hgb urine dipstick LARGE (*)  All other components within normal limits  URINALYSIS, MICROSCOPIC (REFLEX) - Abnormal; Notable for the following:    Bacteria, UA RARE (*)    Squamous Epithelial / LPF 0-5 (*)    All other components within normal limits    EKG  EKG Interpretation None       Radiology Ct Renal Stone Study  Result Date: 08/19/2016 CLINICAL DATA:  Right groin pain for 2 days EXAM: CT ABDOMEN AND PELVIS WITHOUT CONTRAST TECHNIQUE: Multidetector CT imaging of the abdomen and pelvis was performed following the standard protocol without IV contrast. COMPARISON:  05/18/2015 FINDINGS: Lower chest: No acute abnormality. Hepatobiliary: Geographic decreased attenuation is noted within the liver consistent with fatty infiltration. No focal mass lesion is seen. The gallbladder has been surgically removed. Pancreas: Unremarkable. No pancreatic ductal dilatation or surrounding inflammatory changes. Spleen: Normal in size without focal abnormality. Adrenals/Urinary Tract: Adrenal glands are within normal limits. Kidneys are well visualized bilaterally. Multiple small nonobstructing renal stones are seen. The ureters are well visualized  bilaterally. A 2 mm right mid to distal ureteral stone is noted without significant obstructive change. The bladder is decompressed. Stomach/Bowel: The appendix has been surgically removed. No obstructive or inflammatory changes are seen. Vascular/Lymphatic: No significant vascular findings are present. No enlarged abdominal or pelvic lymph nodes. Reproductive: Prostate is unremarkable. Other: No abdominal wall hernia or abnormality. No abdominopelvic ascites. Musculoskeletal: Spinal stimulator is noted. Mild degenerative changes of the lumbar spine are seen. IMPRESSION: Bilateral nonobstructing renal calculi. 2 mm Right mid to distal ureteral stone without significant obstructive change. Electronically Signed   By: Inez Catalina M.D.   On: 08/19/2016 20:54    Procedures Procedures (including critical care time)  Medications Ordered in ED Medications  ondansetron (ZOFRAN-ODT) disintegrating tablet 4 mg (4 mg Oral Given 08/19/16 1957)  ketorolac (TORADOL) 30 MG/ML injection 30 mg (30 mg Intravenous Given 08/19/16 1957)  HYDROmorphone (DILAUDID) injection 1 mg (1 mg Intravenous Given 08/19/16 2035)  HYDROmorphone (DILAUDID) injection 1 mg (1 mg Intravenous Given 08/19/16 2208)  ondansetron (ZOFRAN) injection 4 mg (4 mg Intravenous Given 08/19/16 2208)     Initial Impression / Assessment and Plan / ED Course  I have reviewed the triage vital signs and the nursing notes.  Pertinent labs & imaging results that were available during my care of the patient were reviewed by me and considered in my medical decision making (see chart for details).     36 year old male with 3 days of left flank pain. History of kidney stones. Appears uncomfortable on the examination table. No evidence of fever in the department. Patient is slightly hypertensive, likely secondary to symptoms. Plan to give analgesics and antiemetics in the department. UA ordered for evaluation of kidney stone.  Reviewed. There is evidence of  hemoglobin. Will order CT renal scan for evaluation of kidney stone. Additional analgesics provided in the department.  CT renal scan reviewed. There is a 2 mm mid to distal uretal stone on the right without any evidence of obstructive change. Discussed results with patient. He reports that his pain is on his left side and does extend down into the groin. He does note that this is typical for his kidney stone. He reports that he has had episodes of kidney stones where the pain has been on the opposite side. Given his complaints, we'll plan to do a GU exam for further evaluation.  GU exam as documented above with a chaperone. Physical exam are not concerning for epididymitis or prostatitis. Patient  reports to feeling improved after pain medication, though still a slight pain. Nausea has improved. Will plan to by mouth challenge in the department.  Patient was able to tolerate by mouth in the department without difficulty. Patient cannot be discharged home with any pain medication or nausea medication as he is in a pain contract. He reports that he does have pain medication and nausea medication at home. We'll plan discharge home with Flomax and strainer for urine. Patient does have a urologist. Instructed him to follow up with his urologist this week. Also will provide referral for outpatient urology in case he cannot be seen. Patient stable for discharge at this time. Strict return precautions discussed. Patient and girlfriend express understanding and agreement to plan.    Final Clinical Impressions(s) / ED Diagnoses   Final diagnoses:  Left flank pain  Kidney stone    New Prescriptions Discharge Medication List as of 08/19/2016 10:19 PM       Volanda Napoleon, PA-C 08/21/16 7253    Dorie Rank, MD 08/23/16 6644

## 2016-08-19 NOTE — ED Notes (Signed)
Pt given Ginger Ale per PA for PO challenge.

## 2016-08-19 NOTE — ED Triage Notes (Signed)
Pt c/o left flank pain that started a few days ago, now pain is in his groin with nausea, hx of kidney stones, feels the same

## 2016-08-19 NOTE — ED Notes (Signed)
Patient claimed that he had stones and stent placed before.

## 2016-08-19 NOTE — Discharge Instructions (Signed)
You can take your previously prescribed pain medications at home for the pain. He can also take and has medications at home for the nausea.  Take the Flomax as directed.  Follow-up with her primary care doctor next 24-48 hours for further evaluation. Follow-up with the referred neurologist for further evaluation.  Return the emergency Department for any worsening pain, fever, persistent nausea/vomiting, abdominal pain, difficult to breathing or any other worsening or concerning symptoms.

## 2016-08-19 NOTE — ED Notes (Signed)
Pt given strainer to take home. Pt understands how to use it. MD aware.

## 2016-08-29 ENCOUNTER — Other Ambulatory Visit: Payer: Self-pay | Admitting: *Deleted

## 2016-08-29 ENCOUNTER — Other Ambulatory Visit: Payer: Self-pay | Admitting: Family Medicine

## 2016-08-29 MED ORDER — PANTOPRAZOLE SODIUM 40 MG PO TBEC: 40.0000 mg | DELAYED_RELEASE_TABLET | Freq: Two times a day (BID) | ORAL | 0 refills | Status: DC

## 2016-09-11 ENCOUNTER — Encounter: Payer: Self-pay | Admitting: Family Medicine

## 2016-09-14 ENCOUNTER — Other Ambulatory Visit: Payer: Self-pay | Admitting: Family Medicine

## 2016-09-14 MED ORDER — HYDROMORPHONE HCL 2 MG PO TABS: 2.0000 mg | ORAL_TABLET | ORAL | 0 refills | Status: DC | PRN

## 2016-09-14 MED ORDER — HYDROCODONE-ACETAMINOPHEN 10-325 MG PO TABS: 1.0000 | ORAL_TABLET | Freq: Four times a day (QID) | ORAL | 0 refills | Status: DC | PRN

## 2016-09-27 ENCOUNTER — Telehealth: Payer: Self-pay

## 2016-09-27 NOTE — Telephone Encounter (Signed)
Pt working in [employer] and does not have good reception and [place] left v/m (DPR signed) requesting refills of aripiprazole 5 mg (last refilled # 45 x 1 on 03/04/16) and escitalopram (last refilled # 90 on 06/28/16).Please advise. Last seen 01/27/16.

## 2016-09-29 MED ORDER — ARIPIPRAZOLE 5 MG PO TABS: 2.5000 mg | ORAL_TABLET | Freq: Every day | ORAL | 0 refills | Status: DC

## 2016-09-29 MED ORDER — ESCITALOPRAM OXALATE 20 MG PO TABS: 20.0000 mg | ORAL_TABLET | Freq: Every day | ORAL | 0 refills | Status: DC

## 2016-09-29 NOTE — Telephone Encounter (Signed)
Please cancel the 5mg  tab and have him use the refill on the 2mg  tab that was already done.  Thanks.

## 2016-09-29 NOTE — Telephone Encounter (Signed)
Both sent.  Thanks.  

## 2016-09-29 NOTE — Telephone Encounter (Signed)
Pharmacist advised

## 2016-09-29 NOTE — Telephone Encounter (Signed)
[facility] in [place] left v/m; received abilify 5 mg rx earlier today and pt had abilify 2 mg filled 2 days ago but pt has not picked up yet. [place] wants to know if pt is supposed to be on both meds. Pharmacist will withhold both meds until hears back from PCP.Please advise.

## 2016-10-06 ENCOUNTER — Encounter (HOSPITAL_BASED_OUTPATIENT_CLINIC_OR_DEPARTMENT_OTHER): Payer: Self-pay

## 2016-10-06 ENCOUNTER — Emergency Department (HOSPITAL_BASED_OUTPATIENT_CLINIC_OR_DEPARTMENT_OTHER): Payer: Self-pay

## 2016-10-06 ENCOUNTER — Emergency Department (HOSPITAL_BASED_OUTPATIENT_CLINIC_OR_DEPARTMENT_OTHER)
Admission: EM | Admit: 2016-10-06 | Discharge: 2016-10-06 | Disposition: A | Discharge status: Home / Self Care | Payer: Self-pay | Attending: Emergency Medicine | Admitting: Emergency Medicine

## 2016-10-06 DIAGNOSIS — N132 Hydronephrosis with renal and ureteral calculous obstruction: Secondary | ICD-10-CM | POA: Insufficient documentation

## 2016-10-06 DIAGNOSIS — Z79899 Other long term (current) drug therapy: Secondary | ICD-10-CM | POA: Insufficient documentation

## 2016-10-06 DIAGNOSIS — R1030 Lower abdominal pain, unspecified: Secondary | ICD-10-CM

## 2016-10-06 DIAGNOSIS — N200 Calculus of kidney: Secondary | ICD-10-CM

## 2016-10-06 DIAGNOSIS — Z859 Personal history of malignant neoplasm, unspecified: Secondary | ICD-10-CM | POA: Insufficient documentation

## 2016-10-06 LAB — BASIC METABOLIC PANEL
Anion gap: 6 (ref 5–15)
BUN: 12 mg/dL (ref 6–20)
CO2: 25 mmol/L (ref 22–32)
Calcium: 8.7 mg/dL — ABNORMAL LOW (ref 8.9–10.3)
Chloride: 107 mmol/L (ref 101–111)
Creatinine, Ser: 0.69 mg/dL (ref 0.61–1.24)
GFR calc Af Amer: >60 mL/min (ref >60)
GFR calc non Af Amer: >60 mL/min (ref >60)
Glucose, Bld: 96 mg/dL (ref 65–99)
Potassium: 3.8 mmol/L (ref 3.5–5.1)
Sodium: 138 mmol/L (ref 135–145)

## 2016-10-06 LAB — URINALYSIS, ROUTINE W REFLEX MICROSCOPIC
Bilirubin Urine: NEGATIVE
Glucose, UA: NEGATIVE mg/dL
Hgb urine dipstick: NEGATIVE
Ketones, ur: NEGATIVE mg/dL
Leukocytes, UA: NEGATIVE
Nitrite: NEGATIVE
Protein, ur: NEGATIVE mg/dL
Specific Gravity, Urine: 1.025 (ref 1.005–1.030)
pH: 6 (ref 5.0–8.0)

## 2016-10-06 LAB — CBC
HCT: 47.7 % (ref 39.0–52.0)
Hemoglobin: 16.8 g/dL (ref 13.0–17.0)
MCH: 30.5 pg (ref 26.0–34.0)
MCHC: 35.2 g/dL (ref 30.0–36.0)
MCV: 86.7 fL (ref 78.0–100.0)
Platelets: 291 10*3/uL (ref 150–400)
RBC: 5.5 MIL/uL (ref 4.22–5.81)
RDW: 13.4 % (ref 11.5–15.5)
WBC: 9.8 10*3/uL (ref 4.0–10.5)

## 2016-10-06 MED ORDER — KETOROLAC TROMETHAMINE 30 MG/ML IJ SOLN
30.0000 mg | Freq: Once | INTRAMUSCULAR | Status: AC
  Administered 2016-10-06: 30 mg via INTRAVENOUS
  Filled 2016-10-06: qty 1

## 2016-10-06 MED ORDER — SODIUM CHLORIDE 0.9 % IV BOLUS (SEPSIS)
1000.0000 mL | Freq: Once | INTRAVENOUS | Status: AC
  Administered 2016-10-06: 1000 mL via INTRAVENOUS

## 2016-10-06 MED ORDER — MORPHINE SULFATE (PF) 2 MG/ML IV SOLN
2.0000 mg | INTRAVENOUS | Status: DC | PRN
  Administered 2016-10-06: 2 mg via INTRAVENOUS
  Filled 2016-10-06: qty 1

## 2016-10-06 MED ORDER — ONDANSETRON HCL 8 MG PO TABS
4.0000 mg | ORAL_TABLET | Freq: Once | ORAL | Status: AC
  Administered 2016-10-06: 4 mg via ORAL
  Filled 2016-10-06: qty 1

## 2016-10-06 NOTE — ED Provider Notes (Signed)
[facility] Provider Note   CSN: [id number] Arrival date & time: 10/06/16  1254   History   Chief Complaint Chief Complaint  Patient presents with  . Groin Pain    HPI Casey Caldwell is a 36 y.o. male with PMH of kidney stones, depression, seizures, insomnia, GERD, who presents with groin pain similar to prior episodes of kidney stones. The pain began today. He has chronic pain with a spinal stimulator in place. He is on 15 mg hydrocodone daily. He reports nausea, has not vomited yet. Urinated earlier, did not see blood in his urine. Denies dysuria. Has diarrhea. No fevers. Endorses chills and diaphoresis. He has had chronic kidney stones and has required lithotripsy 4 times in the past.   HPI  Past Medical History:  Diagnosis Date  . Cancer ([place])   . Depression    and panic/anxiety  . GERD (gastroesophageal reflux disease)   . History of kidney stones   . Insomnia   . Kidney stones   . Seizures ([place]) Childhood    Patient Active Problem List   Diagnosis Date Noted  . Encounter for chronic pain management 01/28/2016  . Chronic pain syndrome 10/30/2015  . Adenomatous colon polyp 06/06/2012  . Nephrolithiasis 03/14/2012  . Polycythemia 03/14/2012  . Groin pain 03/14/2012  . RUQ pain 03/14/2012  . Insomnia 03/14/2012  . Seizure disorder ([place]) 03/14/2012  . Anxiety and depression 03/14/2012    Past Surgical History:  Procedure Laterality Date  . APPENDECTOMY  2011  . HYDROCELE EXCISION Left 06/2012  . LAPAROSCOPIC CHOLECYSTECTOMY  1996  . LITHOTRIPSY     four times over the years; most recent 10/08/12  . ORCHIECTOMY Left 11/2012  . SPINAL CORD STIMULATOR INSERTION  2015  . TONSILLECTOMY  1992  . WRIST GANGLION EXCISION  2002   Right     Home Medications    Prior to Admission medications   Medication Sig Start Date End Date Taking? Authorizing Provider  ARIPiprazole (ABILIFY) 5 MG tablet Take 0.5 tablets (2.5 mg total) by mouth daily. 09/29/16    Tonia Ghent, MD  docusate sodium (COLACE) 100 MG capsule Take 1 capsule (100 mg total) by mouth 2 (two) times daily. 01/27/16   Tonia Ghent, MD  escitalopram (LEXAPRO) 20 MG tablet Take 1 tablet (20 mg total) by mouth daily. 09/29/16   Tonia Ghent, MD  HYDROcodone-acetaminophen [facility]) 10-325 MG tablet Take 1 tablet by mouth every 6 (six) hours as needed. 09/14/16   Tonia Ghent, MD  HYDROmorphone (DILAUDID) 2 MG tablet Take 1 tablet (2 mg total) by mouth every 4 (four) hours as needed for severe pain (for kidney stones). 09/14/16   Tonia Ghent, MD  ibuprofen (ADVIL) 200 MG tablet Take 3 tablets (600 mg total) by mouth 2 (two) times daily as needed. 10/29/15   Tonia Ghent, MD  ondansetron (ZOFRAN ODT) 4 MG disintegrating tablet Take 1 tablet (4 mg total) by mouth every 8 (eight) hours as needed for nausea or vomiting. 12/22/15   Tonia Ghent, MD  polyethylene glycol powder (GLYCOLAX/MIRALAX) powder MIX 17G IN 4 TO 8 OUNCES OF FLUID AND TAKE TWICE DAILY 02/17/15   Tonia Ghent, MD  tamsulosin (FLOMAX) 0.4 MG CAPS capsule Take 1 capsule (0.4 mg total) by mouth daily. 08/19/16   Volanda Napoleon, PA-C  zolpidem (AMBIEN) 10 MG tablet Take 1 tablet (10 mg total) by mouth at bedtime as needed. 07/28/16   Tonia Ghent,  MD    Family History Family History  Problem Relation Age of Onset  . Stroke Mother   . Liver disease Mother   . Irritable bowel syndrome Mother   . Kidney disease Mother   . Heart disease Father   . Colon polyps Father   . Colon cancer Neg Hx     Social History Social History  Substance Use Topics  . Smoking status: Never Smoker  . Smokeless tobacco: Never Used  . Alcohol use 0.0 oz/week     Comment: rarely     Allergies   Phenergan [promethazine hcl]; Tape; Trileptal [oxcarbazepine]; and Wellbutrin [bupropion]   Review of Systems Review of Systems  Constitutional: Positive for appetite change, chills and diaphoresis. Negative for  activity change, fatigue and fever.  HENT: Negative for congestion.   Respiratory: Negative for cough and shortness of breath.   Cardiovascular: Negative for chest pain.  Gastrointestinal: Positive for abdominal pain, diarrhea and nausea. Negative for blood in stool, constipation and vomiting.  Genitourinary: Positive for flank pain. Negative for difficulty urinating, discharge, dysuria, frequency, hematuria, penile pain, penile swelling, scrotal swelling, testicular pain and urgency.  Musculoskeletal: Positive for back pain and myalgias. Negative for neck pain.  Skin: Negative for rash.  Neurological: Negative for seizures, weakness, numbness and headaches.  Psychiatric/Behavioral: Negative for agitation and behavioral problems.     Physical Exam Updated Vital Signs BP (!) 141/110 (BP Location: Left Arm)   Pulse 82   Temp 98.1 F (36.7 C) (Oral)   Resp 20   Ht 6' (1.829 m)   Wt (!) 139.7 kg (308 lb)   SpO2 96%   BMI 41.77 kg/m   Physical Exam  Constitutional: He is oriented to person, place, and time. He appears well-developed and well-nourished. He appears distressed.  HENT:  Head: Normocephalic and atraumatic.  Lips dry, MMM  Eyes: Pupils are equal, round, and reactive to light. EOM are normal.  Neck: Normal range of motion. Neck supple.  Cardiovascular: Normal rate, regular rhythm, normal heart sounds and intact distal pulses.   Pulmonary/Chest: Effort normal and breath sounds normal. No respiratory distress.  Abdominal: Soft. Bowel sounds are normal. He exhibits no distension and no mass. There is tenderness. There is no guarding.  Genitourinary: Penis normal. Right testis shows no tenderness.  Genitourinary Comments: Surgically absent left testicle  Musculoskeletal: Normal range of motion. He exhibits no edema.  Neurological: He is alert and oriented to person, place, and time. He exhibits normal muscle tone.  Skin: Skin is warm and dry. No rash noted.   ED Treatments  / Results  Labs (all labs ordered are listed, but only abnormal results are displayed) Labs Reviewed  URINALYSIS, ROUTINE W REFLEX MICROSCOPIC  BASIC METABOLIC PANEL  CBC    EKG  EKG Interpretation None       Radiology Ct Renal Stone Study  Result Date: 10/06/2016 CLINICAL DATA:  Bilateral flank and groin pain. History of kidney stones, status post lithotripsy. History of spinal cord stimulator in appendectomy. EXAM: CT ABDOMEN AND PELVIS WITHOUT CONTRAST TECHNIQUE: Multidetector CT imaging of the abdomen and pelvis was performed following the standard protocol without IV contrast. COMPARISON:  CT abdomen and pelvis August 19, 2016 FINDINGS: LOWER CHEST: Lung bases are clear. The visualized heart size is normal. No pericardial effusion. HEPATOBILIARY: The liver is diffusely hypodense compatible with steatosis. Status post cholecystectomy. PANCREAS: Normal. SPLEEN: Normal. ADRENALS/URINARY TRACT: Kidneys are orthotopic, demonstrating normal size and morphology. Bilateral nephrolithiasis measuring to 2 mm. No  hydronephrosis; limited assessment for renal masses on this nonenhanced examination. The unopacified ureters are normal in course and caliber. Urinary bladder is partially distended and unremarkable. Normal adrenal glands. STOMACH/BOWEL: The stomach, small and large bowel are normal in course and caliber without inflammatory changes, sensitivity decreased by lack of enteric contrast. VASCULAR/LYMPHATIC: Aortoiliac vessels are normal in course and caliber. No lymphadenopathy by CT size criteria. Phleboliths in the pelvis. REPRODUCTIVE: Normal. OTHER: No intraperitoneal free fluid or free air. MUSCULOSKELETAL: Non-acute. Small fat containing umbilical hernia. Spinal cord stimulator via LEFT L2-3 interlaminar approach, distal tip and dorsal canal at T9. IMPRESSION: 1. Bilateral small nonobstructing nephrolithiasis. Interval passage of RIGHT ureteral calculus. 2. No acute intra-abdominal or pelvic  process. Electronically Signed   By: Elon Alas M.D.   On: 10/06/2016 14:43    Procedures Procedures (including critical care time)  Medications Ordered in ED Medications  morphine 2 MG/ML injection 2 mg (2 mg Intravenous Given 10/06/16 1414)  ondansetron (ZOFRAN) tablet 4 mg (4 mg Oral Given 10/06/16 1402)  sodium chloride 0.9 % bolus 1,000 mL (0 mLs Intravenous Stopped 10/06/16 1458)     Initial Impression / Assessment and Plan / ED Course  I have reviewed the triage vital signs and the nursing notes.  Pertinent labs & imaging results that were available during my care of the patient were reviewed by me and considered in my medical decision making (see chart for details).     36 year old male with PMH of chronic kidney stones presents with bilateral groin pain present for the past 1 day. Given 1L NS bolus, zofran, and morphine in ED with improvement in fluid status, nausea and pain. CT renal stone study demonstrates bilateral nonobstructing stones with interval passage of right ureteral stone, no other lesions appreciated.   Dispo pending labwork, I anticipate he will able to go home as he is without signs and symptoms of infection. He has home pain meds and zofran available to him. Care handed off to Dr. Billy Fischer  Final Clinical Impressions(s) / ED Diagnoses   Final diagnoses:  Right ureteral stone  Bilateral kidney stones    New Prescriptions New Prescriptions   No medications on file   Lucila Maine, DO PGY-2, [facility] 10/06/2016 3:11 PM    Steve Rattler, DO 10/06/16 1511    Little, Wenda Overland, MD 10/08/16 1027

## 2016-10-06 NOTE — ED Triage Notes (Signed)
C/o groin pain x 1 week-worse x 2 days-states feels like kidney stone pain-griamcing-slow gait

## 2016-10-06 NOTE — ED Notes (Signed)
Pt d/c home with ride 

## 2016-10-06 NOTE — ED Notes (Signed)
ED Provider at bedside. 

## 2016-10-10 ENCOUNTER — Other Ambulatory Visit: Payer: Self-pay | Admitting: Family Medicine

## 2016-10-10 ENCOUNTER — Encounter: Payer: Self-pay | Admitting: Family Medicine

## 2016-10-10 ENCOUNTER — Encounter: Payer: Self-pay | Admitting: *Deleted

## 2016-10-10 MED ORDER — HYDROCODONE-ACETAMINOPHEN 10-325 MG PO TABS: 1.0000 | ORAL_TABLET | Freq: Four times a day (QID) | ORAL | 0 refills | Status: DC | PRN

## 2016-10-10 MED ORDER — HYDROMORPHONE HCL 2 MG PO TABS: 2.0000 mg | ORAL_TABLET | ORAL | 0 refills | Status: DC | PRN

## 2016-10-21 ENCOUNTER — Other Ambulatory Visit: Payer: Self-pay | Admitting: Family Medicine

## 2016-10-21 NOTE — Telephone Encounter (Signed)
Electronic refill request. Zolpidem Last office visit:   01/27/2016 Last Filled:    90 tablet 0 07/28/2016  Please advise.

## 2016-10-22 NOTE — Telephone Encounter (Signed)
Please call in.  Thanks.   

## 2016-10-24 ENCOUNTER — Encounter: Payer: Self-pay | Admitting: Family Medicine

## 2016-10-24 NOTE — Telephone Encounter (Signed)
Medication phoned to pharmacy.  

## 2016-10-25 ENCOUNTER — Telehealth: Payer: Self-pay | Admitting: Family Medicine
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
## 2016-10-25 NOTE — Telephone Encounter (Signed)
Call pt.  Needs OV.  65min.  Thanks.
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
## 2016-10-26 NOTE — Telephone Encounter (Signed)
What can be done to help with financials?  I can write off one visit if needed.  I don't want him not to get care.  Let me know.  Thanks.

## 2016-10-26 NOTE — Telephone Encounter (Signed)
Left message on voicemail for patient to call back.  After conversation with Dr. Damita Dunnings need to give patient telephone number to [facility] [phone]) for him to contact them to see if they can help him out and work out a payment plan for him. If not, he can write off one visit if needed.

## 2016-10-27 NOTE — Telephone Encounter (Signed)
Thanks

## 2016-11-03 ENCOUNTER — Encounter: Payer: Self-pay | Admitting: Family Medicine

## 2016-11-03 ENCOUNTER — Ambulatory Visit (INDEPENDENT_AMBULATORY_CARE_PROVIDER_SITE_OTHER): Payer: Self-pay | Admitting: Family Medicine

## 2016-11-03 DIAGNOSIS — G8929 Other chronic pain: Secondary | ICD-10-CM

## 2016-11-03 DIAGNOSIS — F329 Major depressive disorder, single episode, unspecified: Secondary | ICD-10-CM

## 2016-11-03 DIAGNOSIS — F419 Anxiety disorder, unspecified: Secondary | ICD-10-CM

## 2016-11-03 DIAGNOSIS — F32A Depression, unspecified: Secondary | ICD-10-CM

## 2016-11-03 MED ORDER — HYDROCODONE-ACETAMINOPHEN 10-325 MG PO TABS: 1.0000 | ORAL_TABLET | Freq: Three times a day (TID) | ORAL | Status: DC | PRN

## 2016-11-03 NOTE — Patient Instructions (Signed)
Cut back by 1/2 tab of hydrocodone (ex 3--->2.5 tabs per day).  Do that for 1 week, then try to cut another 1/2 tab per day (2.5---->2 tabs per day).  Update me in about 2 weeks.   If needed replace the dropped hydrocodone tab with tylenol.  Keep moving as much as possible.  Check your meds list at home re: lexapro and abilify dose.  Get a flu shot at the pharmacy- that may be cheaper.  No charge for visit.  Take care.  Glad to see you.

## 2016-11-03 NOTE — Progress Notes (Signed)
He was doing well with work until his job orders dropped off.  He was doing well overall, taking less pain medicine.  Then he got home, was less active.  He is going to go back to see Dr. Catheryn Bacon in early 2019; had injection earlier in 2018 with relief.  He had stimulator reprogramming done earlier in 2018.  He is trying to get financing set up for f/u visits.   Less groin pain than prev, compared to historical average but still in the L groin and L thigh.  He has some medial L thigh numbness at baseline.    ER visit re: stones noted.  CT with  1. Bilateral small nonobstructing nephrolithiasis. Interval passage of RIGHT ureteral calculus. 2. No acute intra-abdominal or pelvic process.  He is frustrated about his current situation, re: work and pain.  No SI/HI.  No illicits.  He is going back to work next week.    Meds, vitals, and allergies reviewed.   ROS: Per HPI unless specifically indicated in ROS section   GEN: nad, alert and oriented, affect and judgment normal. No suicidal or homicidal intent. HEENT: mucous membranes moist NECK: supple w/o LA CV: rrr.  PULM: ctab, no inc wob ABD: soft, +bs EXT: no edema SKIN: no acute rash L lower back ttp, midline nontender. Stimulator palpable in the right lower back. Numbness in the left medial side noted. Otherwise grossly neurovascularly intact in the bilateral Lotrimin is. Able to bear weight.

## 2016-11-04 NOTE — Assessment & Plan Note (Signed)
He was frustrated with work situation but he does not have suicidal or homicidal intent. Advised him to recheck his med list at home just to make sure he was taking the dose of Abilify we have listed. Continue Lexapro. No suicidal or homicidal intent. Okay for outpatient follow-up.  He was unable to pay for today's visit so we no charged the visit.

## 2016-11-04 NOTE — Assessment & Plan Note (Signed)
Discussed with patient. His pain is improved from overall historic averages. He had his back re-injected earlier this year and the spinal cord stimulator was reprogrammed. Begin slow taper of opiates. He will cut out half tab of hydrocodone weekly. He can replace that dose of hydrocodone with Tylenol if needed. Continue Dilaudid when he has a flare pain related to kidney stones. He has stones documented on recent CT. He agrees with plan. He is trying to get financing set up a go back to the pain clinic in early 2019.

## 2016-11-15 ENCOUNTER — Emergency Department (HOSPITAL_BASED_OUTPATIENT_CLINIC_OR_DEPARTMENT_OTHER): Payer: Self-pay

## 2016-11-15 ENCOUNTER — Emergency Department (HOSPITAL_BASED_OUTPATIENT_CLINIC_OR_DEPARTMENT_OTHER)
Admission: EM | Admit: 2016-11-15 | Discharge: 2016-11-15 | Disposition: A | Discharge status: Home / Self Care | Payer: Self-pay | Attending: Emergency Medicine | Admitting: Emergency Medicine

## 2016-11-15 ENCOUNTER — Encounter (HOSPITAL_BASED_OUTPATIENT_CLINIC_OR_DEPARTMENT_OTHER): Payer: Self-pay | Admitting: Emergency Medicine

## 2016-11-15 DIAGNOSIS — R1032 Left lower quadrant pain: Secondary | ICD-10-CM | POA: Insufficient documentation

## 2016-11-15 DIAGNOSIS — R300 Dysuria: Secondary | ICD-10-CM | POA: Insufficient documentation

## 2016-11-15 DIAGNOSIS — N50819 Testicular pain, unspecified: Secondary | ICD-10-CM | POA: Insufficient documentation

## 2016-11-15 DIAGNOSIS — R109 Unspecified abdominal pain: Secondary | ICD-10-CM | POA: Insufficient documentation

## 2016-11-15 DIAGNOSIS — Z79899 Other long term (current) drug therapy: Secondary | ICD-10-CM | POA: Insufficient documentation

## 2016-11-15 LAB — BASIC METABOLIC PANEL
Anion gap: 6 (ref 5–15)
BUN: 10 mg/dL (ref 6–20)
CO2: 27 mmol/L (ref 22–32)
Calcium: 8.9 mg/dL (ref 8.9–10.3)
Chloride: 103 mmol/L (ref 101–111)
Creatinine, Ser: 0.91 mg/dL (ref 0.61–1.24)
GFR calc Af Amer: >60 mL/min (ref >60)
GFR calc non Af Amer: >60 mL/min (ref >60)
Glucose, Bld: 91 mg/dL (ref 65–99)
Potassium: 3.8 mmol/L (ref 3.5–5.1)
Sodium: 136 mmol/L (ref 135–145)

## 2016-11-15 LAB — CBC WITH DIFFERENTIAL/PLATELET
Basophils Absolute: 0 10*3/uL (ref 0.0–0.1)
Basophils Relative: 0 %
Eosinophils Absolute: 0.3 10*3/uL (ref 0.0–0.7)
Eosinophils Relative: 3 %
HCT: 49.3 % (ref 39.0–52.0)
Hemoglobin: 17.3 g/dL — ABNORMAL HIGH (ref 13.0–17.0)
Lymphocytes Relative: 29 %
Lymphs Abs: 2.8 10*3/uL (ref 0.7–4.0)
MCH: 30.1 pg (ref 26.0–34.0)
MCHC: 35.1 g/dL (ref 30.0–36.0)
MCV: 85.7 fL (ref 78.0–100.0)
Monocytes Absolute: 1.2 10*3/uL — ABNORMAL HIGH (ref 0.1–1.0)
Monocytes Relative: 12 %
Neutro Abs: 5.5 10*3/uL (ref 1.7–7.7)
Neutrophils Relative %: 56 %
Platelets: 311 10*3/uL (ref 150–400)
RBC: 5.75 MIL/uL (ref 4.22–5.81)
RDW: 13.7 % (ref 11.5–15.5)
WBC: 9.8 10*3/uL (ref 4.0–10.5)

## 2016-11-15 LAB — URINALYSIS, ROUTINE W REFLEX MICROSCOPIC
Bilirubin Urine: NEGATIVE
Glucose, UA: NEGATIVE mg/dL
Hgb urine dipstick: NEGATIVE
Ketones, ur: NEGATIVE mg/dL
Leukocytes, UA: NEGATIVE
Nitrite: NEGATIVE
Protein, ur: NEGATIVE mg/dL
Specific Gravity, Urine: 1.025 (ref 1.005–1.030)
pH: 5.5 (ref 5.0–8.0)

## 2016-11-15 MED ORDER — SODIUM CHLORIDE 0.9 % IV BOLUS (SEPSIS)
1000.0000 mL | Freq: Once | INTRAVENOUS | Status: AC
  Administered 2016-11-15: 1000 mL via INTRAVENOUS

## 2016-11-15 MED ORDER — MORPHINE SULFATE (PF) 4 MG/ML IV SOLN
8.0000 mg | Freq: Once | INTRAVENOUS | Status: AC
Start: 2016-11-15 — End: 2016-11-15
  Administered 2016-11-15: 8 mg via INTRAVENOUS
  Filled 2016-11-15: qty 2

## 2016-11-15 MED ORDER — ONDANSETRON HCL 4 MG PO TABS: 4.0000 mg | ORAL_TABLET | Freq: Three times a day (TID) | ORAL | 0 refills | Status: DC | PRN

## 2016-11-15 MED ORDER — ONDANSETRON HCL 4 MG/2ML IJ SOLN
4.0000 mg | Freq: Once | INTRAMUSCULAR | Status: AC
  Administered 2016-11-15: 4 mg via INTRAVENOUS
  Filled 2016-11-15: qty 2

## 2016-11-15 MED ORDER — KETOROLAC TROMETHAMINE 30 MG/ML IJ SOLN
30.0000 mg | Freq: Once | INTRAMUSCULAR | Status: AC
  Administered 2016-11-15: 30 mg via INTRAVENOUS
  Filled 2016-11-15: qty 1

## 2016-11-15 NOTE — ED Notes (Signed)
Patient back from [facility] / a obtained  - patient states that he is still have the same amount of pain

## 2016-11-15 NOTE — ED Provider Notes (Signed)
[facility]ER[facility]ENC[facility] DEPARTMENT Provider Note   CSN: [id number] Arrival date & time: 11/15/16  1542     History   Chief Complaint Chief Complaint  Patient presents with  . Back Pain    HPI Casey Caldwell is a 36 y.o. male.  The history is provided by the patient.  Back Pain   This is a new problem. The current episode started yesterday. The problem occurs constantly. The problem has been gradually worsening. The pain is associated with no known injury. Pain location: left flank. The quality of the pain is described as stabbing. Radiates to: groin/testicle. The pain is severe. Exacerbated by: nothing. The pain is the same all the time. Associated symptoms include dysuria. Pertinent negatives include no chest pain, no fever, no abdominal swelling and no leg pain. He has tried nothing for the symptoms.   36 year old male who presents with left flank pain and groin pain. Onset of symptoms yesterday, but acutely worsened today associated with nausea. Has had mild dysuria but no frequency, hematuria hesitancy or retention. States it feels similar to prior kidney stones. No fevers but having chills. States testicular pain as well without swelling or trauma.Patient with history of chronic pain syndrome, depression, and nephrolithiasis.   Past Medical History:  Diagnosis Date  . Cancer ([place])   . Depression    and panic/anxiety  . GERD (gastroesophageal reflux disease)   . History of kidney stones   . Insomnia   . Kidney stones   . Seizures ([place]) Childhood    Patient Active Problem List   Diagnosis Date Noted  . Encounter for chronic pain management 01/28/2016  . Chronic pain syndrome 10/30/2015  . Adenomatous colon polyp 06/06/2012  . Nephrolithiasis 03/14/2012  . Polycythemia 03/14/2012  . Groin pain 03/14/2012  . RUQ pain 03/14/2012  . Insomnia 03/14/2012  . Seizure disorder ([place]) 03/14/2012  . Anxiety and depression 03/14/2012    Past Surgical History:    Procedure Laterality Date  . APPENDECTOMY  2011  . HYDROCELE EXCISION Left 06/2012  . LAPAROSCOPIC CHOLECYSTECTOMY  1996  . LITHOTRIPSY     four times over the years; most recent 10/08/12  . ORCHIECTOMY Left 11/2012  . SPINAL CORD STIMULATOR INSERTION  2015  . TONSILLECTOMY  1992  . WRIST GANGLION EXCISION  2002   Right       Home Medications    Prior to Admission medications   Medication Sig Start Date End Date Taking? Authorizing Provider  ARIPiprazole (ABILIFY) 5 MG tablet Take 0.5 tablets (2.5 mg total) by mouth daily. 09/29/16   Tonia Ghent, MD  docusate sodium (COLACE) 100 MG capsule Take 1 capsule (100 mg total) by mouth 2 (two) times daily. 01/27/16   Tonia Ghent, MD  escitalopram (LEXAPRO) 20 MG tablet Take 1 tablet (20 mg total) by mouth daily. 09/29/16   Tonia Ghent, MD  HYDROcodone-acetaminophen [facility]) 10-325 MG tablet Take 1 tablet by mouth 3 (three) times daily as needed. Fill on/after 10/14/16 11/03/16   Tonia Ghent, MD  HYDROmorphone (DILAUDID) 2 MG tablet Take 1 tablet (2 mg total) by mouth every 4 (four) hours as needed for severe pain (for kidney stones). Fill on/after 10/14/16 10/10/16   Tonia Ghent, MD  ibuprofen (ADVIL) 200 MG tablet Take 3 tablets (600 mg total) by mouth 2 (two) times daily as needed. 10/29/15   Tonia Ghent, MD  ondansetron (ZOFRAN ODT) 4 MG disintegrating tablet Take 1 tablet (4 mg total)  by mouth every 8 (eight) hours as needed for nausea or vomiting. 12/22/15   Tonia Ghent, MD  ondansetron (ZOFRAN) 4 MG tablet Take 1 tablet (4 mg total) by mouth every 8 (eight) hours as needed for nausea or vomiting. 11/15/16   Forde Dandy, MD  polyethylene glycol powder (GLYCOLAX/MIRALAX) powder MIX 17G IN 4 TO 8 OUNCES OF FLUID AND TAKE TWICE DAILY 02/17/15   Tonia Ghent, MD  tamsulosin (FLOMAX) 0.4 MG CAPS capsule Take 1 capsule (0.4 mg total) by mouth daily. 08/19/16   Volanda Napoleon, PA-C  zolpidem (AMBIEN) 10 MG tablet  TAKE 1 TABLET BY MOUTH AT BEDTIME AS NEEDED 10/22/16   Tonia Ghent, MD    Family History Family History  Problem Relation Age of Onset  . Stroke Mother   . Liver disease Mother   . Irritable bowel syndrome Mother   . Kidney disease Mother   . Heart disease Father   . Colon polyps Father   . Colon cancer Neg Hx     Social History Social History  Substance Use Topics  . Smoking status: Never Smoker  . Smokeless tobacco: Never Used  . Alcohol use 0.0 oz/week     Comment: rarely     Allergies   Phenergan [promethazine hcl]; Tape; Trileptal [oxcarbazepine]; and Wellbutrin [bupropion]   Review of Systems Review of Systems  Constitutional: Negative for fever.  Cardiovascular: Negative for chest pain.  Genitourinary: Positive for dysuria, flank pain and testicular pain. Negative for discharge, frequency, penile pain, penile swelling and scrotal swelling.  Musculoskeletal: Positive for back pain.  All other systems reviewed and are negative.    Physical Exam Updated Vital Signs BP 123/89 (BP Location: Right Arm)   Pulse 61   Temp 97.9 F (36.6 C) (Oral)   Resp 20   Ht 6' (1.829 m)   Wt (!) 142.9 kg (315 lb)   SpO2 97%   BMI 42.72 kg/m   Physical Exam Physical Exam  Nursing note and vitals reviewed. Constitutional: Well developed, well nourished, non-toxic, and in no acute distress Head: Normocephalic and atraumatic.  Mouth/Throat: Oropharynx is clear and moist.  Neck: Normal range of motion. Neck supple.  Cardiovascular: Normal rate and regular rhythm.   Pulmonary/Chest: Effort normal and breath sounds normal.  Abdominal: Soft. There is no tenderness. There is no rebound and no guarding. mild left CVA tenderness. Musculoskeletal: Normal range of motion.  Neurological: Alert, no facial droop, fluent speech, moves all extremities symmetrically Skin: Skin is warm and dry.  Psychiatric: Cooperative GU: normal external genitalia. Normal cremasteric reflexes. No  scrotal swelling or testicular swelling. Absent left testicle. Normal right testicular lie. Exquisite tenderness to palpation over right testicles.   ED Treatments / Results  Labs (all labs ordered are listed, but only abnormal results are displayed) Labs Reviewed  CBC WITH DIFFERENTIAL/PLATELET - Abnormal; Notable for the following:       Result Value   Hemoglobin 17.3 (*)    Monocytes Absolute 1.2 (*)    All other components within normal limits  URINALYSIS, ROUTINE W REFLEX MICROSCOPIC  BASIC METABOLIC PANEL    EKG  EKG Interpretation None       Radiology US Scrotum  Result Date: 11/15/2016 CLINICAL DATA:  Groin pain history of left orchiectomy EXAM: SCROTAL ULTRASOUND DOPPLER ULTRASOUND OF THE TESTICLES TECHNIQUE: Complete ultrasound examination of the testicles, epididymis, and other scrotal structures was performed. Color and spectral Doppler ultrasound were also utilized to evaluate blood flow  to the testicles. COMPARISON:  01/26/2016 FINDINGS: Right testicle Measurements: 4.6 x 2.3 x 3.1 cm. No mass or microlithiasis visualized. Left testicle Surgically absent Right epididymis:  Normal in size and appearance. Hydrocele:  None visualized. Varicocele:  None visualized. Pulsed Doppler interrogation of both testes demonstrates normal low resistance arterial and venous waveforms bilaterally. IMPRESSION: 1. Status post left orchiectomy. 2. Normal ultrasound appearance of the right testis with no evidence for torsion or mass Electronically Signed   By: Donavan Foil M.D.   On: 11/15/2016 18:35   US Renal  Result Date: 11/15/2016 CLINICAL DATA:  Back pain which is worse on the left for 2 days. History of kidney stones. EXAM: RENAL / URINARY TRACT ULTRASOUND COMPLETE COMPARISON:  CT abdomen and pelvis 10/06/2016. FINDINGS: Right Kidney: Length: 13.1 cm. Echogenicity within normal limits. No mass or hydronephrosis visualized. Punctate stone in the lower pole of the right kidney seen on  prior CT is not definitely visualized on this exam. Left Kidney: Length: 13.7 cm. Echogenicity within normal limits. No mass or hydronephrosis visualized. Echogenic focus in the lower pole may be the punctate nonobstructing stone seen on prior CT. Bladder: Appears normal for degree of bladder distention. IMPRESSION: Negative for hydronephrosis. Punctate nonobstructing right renal stone seen on prior CT is not definitely visualized on this exam. Punctate nonobstructing stone lower pole the left kidney is noted and was present on the prior CT. Electronically Signed   By: Inge Rise M.D.   On: 11/15/2016 18:29   [place] Scrotom Doppler  Result Date: 11/15/2016 CLINICAL DATA:  Groin pain history of left orchiectomy EXAM: SCROTAL ULTRASOUND DOPPLER ULTRASOUND OF THE TESTICLES TECHNIQUE: Complete ultrasound examination of the testicles, epididymis, and other scrotal structures was performed. Color and spectral Doppler ultrasound were also utilized to evaluate blood flow to the testicles. COMPARISON:  01/26/2016 FINDINGS: Right testicle Measurements: 4.6 x 2.3 x 3.1 cm. No mass or microlithiasis visualized. Left testicle Surgically absent Right epididymis:  Normal in size and appearance. Hydrocele:  None visualized. Varicocele:  None visualized. Pulsed Doppler interrogation of both testes demonstrates normal low resistance arterial and venous waveforms bilaterally. IMPRESSION: 1. Status post left orchiectomy. 2. Normal ultrasound appearance of the right testis with no evidence for torsion or mass Electronically Signed   By: Donavan Foil M.D.   On: 11/15/2016 18:35    Procedures Procedures (including critical care time)  Medications Ordered in ED Medications  morphine 4 MG/ML injection 8 mg (not administered)  sodium chloride 0.9 % bolus 1,000 mL (1,000 mLs Intravenous New Bag/Given 11/15/16 1742)  ondansetron (ZOFRAN) injection 4 mg (4 mg Intravenous Given 11/15/16 1741)  ketorolac (TORADOL) 30 MG/ML  injection 30 mg (30 mg Intravenous Given 11/15/16 1741)     Initial Impression / Assessment and Plan / ED Course  I have reviewed the triage vital signs and the nursing notes.  Pertinent labs & imaging results that were available during my care of the patient were reviewed by me and considered in my medical decision making (see chart for details).     Records are reviewed. Patient with multiple ED visits with flank pain urinary complaints that are similar to previous history of kidney stones. Said multiple renal ultrasounds as well as CT renal studies.  Vitals signs normal today. Soft and benign abdomen. With left CVA tenderness. Left testicle is absent on exam, and he has exquisitely tender right testicle with normal lie and no overlying skin changes. UA is normal without blood or infection. Blood work  otherwise reassuring.  Renal ultrasound and testicular/scrotal ultrasound performed. No evidence of torsion. No evidence of urolithiasis. Patient received pain control in ED. Will follow-up with pain clinic and PCP for ongoing evaluation.    Final Clinical Impressions(s) / ED Diagnoses   Final diagnoses:  Testicular pain  Left flank pain  Left groin pain    New Prescriptions New Prescriptions   ONDANSETRON (ZOFRAN) 4 MG TABLET    Take 1 tablet (4 mg total) by mouth every 8 (eight) hours as needed for nausea or vomiting.     Forde Dandy, MD 11/15/16 [phone]

## 2016-11-15 NOTE — Discharge Instructions (Signed)
Your work-up does not suggest kidney stone or other serious process Please follow-up with your primary care doctor for ongoing work-up  Please return without fail for worsening symptoms, including fever, intractable vomiting or any other symptoms concerning to you.

## 2016-11-15 NOTE — ED Notes (Signed)
Patient transported to Ultrasound 

## 2016-11-15 NOTE — ED Triage Notes (Signed)
Bilateral back pain with dysuria since yesterday. Reports feels similar to past kidney stones.

## 2016-12-01 ENCOUNTER — Encounter: Payer: Self-pay | Admitting: Family Medicine

## 2016-12-05 ENCOUNTER — Other Ambulatory Visit: Payer: Self-pay | Admitting: Family Medicine

## 2016-12-05 DIAGNOSIS — G8929 Other chronic pain: Secondary | ICD-10-CM

## 2016-12-05 MED ORDER — HYDROCODONE-ACETAMINOPHEN 10-325 MG PO TABS: 1.0000 | ORAL_TABLET | Freq: Three times a day (TID) | ORAL | 0 refills | Status: DC | PRN

## 2016-12-05 MED ORDER — HYDROMORPHONE HCL 2 MG PO TABS: 2.0000 mg | ORAL_TABLET | ORAL | 0 refills | Status: DC | PRN

## 2016-12-05 NOTE — Progress Notes (Signed)
rx printed. Thanks.   Patient advised. Rx left at front desk for pick up. 12/05/16  L. [place], CMA

## 2016-12-20 ENCOUNTER — Encounter: Payer: Self-pay | Admitting: Family Medicine

## 2016-12-21 ENCOUNTER — Encounter: Payer: Self-pay | Admitting: Family Medicine

## 2016-12-23 ENCOUNTER — Telehealth: Payer: Self-pay | Admitting: Family Medicine

## 2016-12-23 NOTE — Telephone Encounter (Signed)
Copied from [place] [id number]. Topic: Inquiry >> Dec 23, 2016  3:03 PM Neva Seat wrote: Pt is asking about Rx amount given to him this last refill.  The amount was less and he was asking why.  Please call pt back asap.

## 2016-12-26 NOTE — Telephone Encounter (Signed)
Left message to call back regarding his pain medication.

## 2016-12-26 NOTE — Telephone Encounter (Signed)
Patient states he normally gets 120 pills/month. Patient had been decreasing dosage- he was down to 3/day. Patient did have a back flare and he did increase his dose for a short time. Patient wanted to get the same amount and stretch his refills out- due to cost- but if not able to do that then patient will need his next refill by the end of this week. Patient can be reached at his number on file.

## 2016-12-27 ENCOUNTER — Other Ambulatory Visit: Payer: Self-pay | Admitting: Family Medicine

## 2016-12-27 MED ORDER — HYDROCODONE-ACETAMINOPHEN 10-325 MG PO TABS
1.0000 | ORAL_TABLET | Freq: Three times a day (TID) | ORAL | 0 refills | Status: DC | PRN
Start: 2016-12-27 — End: 2017-02-03

## 2016-12-27 MED ORDER — HYDROCODONE-ACETAMINOPHEN 10-325 MG PO TABS: 1.0000 | ORAL_TABLET | Freq: Three times a day (TID) | ORAL | 0 refills | Status: DC | PRN

## 2016-12-27 NOTE — Telephone Encounter (Signed)
Patient advised.

## 2016-12-27 NOTE — Telephone Encounter (Signed)
rx corrected, should be done for 1 month supply, printed rx for 90.  Thanks for update from patient.   Glad he has been able to taper his med.   Let me know if/when needing refill on dilaudid.  Will need q3 month opiate visits.  Thanks.

## 2016-12-30 ENCOUNTER — Other Ambulatory Visit: Payer: Self-pay | Admitting: Family Medicine

## 2017-01-12 ENCOUNTER — Other Ambulatory Visit: Payer: Self-pay | Admitting: Family Medicine

## 2017-01-13 NOTE — Telephone Encounter (Signed)
Last refill 10/22/16 #90 Last office visit 11/03/16

## 2017-01-15 NOTE — Telephone Encounter (Signed)
Please call in.  Thanks.   

## 2017-01-16 NOTE — Telephone Encounter (Signed)
Medication phoned to cost [facility] [employer] as instructed.

## 2017-01-27 ENCOUNTER — Emergency Department (HOSPITAL_COMMUNITY)
Admission: EM | Admit: 2017-01-27 | Discharge: 2017-01-27 | Disposition: A | Discharge status: Home / Self Care | Payer: No Typology Code available for payment source | Attending: Emergency Medicine | Admitting: Emergency Medicine

## 2017-01-27 ENCOUNTER — Other Ambulatory Visit: Payer: Self-pay | Admitting: Family Medicine

## 2017-01-27 ENCOUNTER — Emergency Department (HOSPITAL_COMMUNITY): Payer: No Typology Code available for payment source

## 2017-01-27 ENCOUNTER — Encounter (HOSPITAL_COMMUNITY): Payer: Self-pay | Admitting: Emergency Medicine

## 2017-01-27 DIAGNOSIS — Y999 Unspecified external cause status: Secondary | ICD-10-CM | POA: Insufficient documentation

## 2017-01-27 DIAGNOSIS — Z859 Personal history of malignant neoplasm, unspecified: Secondary | ICD-10-CM | POA: Diagnosis not present

## 2017-01-27 DIAGNOSIS — R51 Headache: Secondary | ICD-10-CM | POA: Insufficient documentation

## 2017-01-27 DIAGNOSIS — Z79899 Other long term (current) drug therapy: Secondary | ICD-10-CM | POA: Insufficient documentation

## 2017-01-27 DIAGNOSIS — R079 Chest pain, unspecified: Secondary | ICD-10-CM | POA: Insufficient documentation

## 2017-01-27 DIAGNOSIS — Y939 Activity, unspecified: Secondary | ICD-10-CM | POA: Insufficient documentation

## 2017-01-27 LAB — CBC WITH DIFFERENTIAL/PLATELET
Basophils Absolute: 0 10*3/uL (ref 0.0–0.1)
Basophils Relative: 0 %
Eosinophils Absolute: 0.2 10*3/uL (ref 0.0–0.7)
Eosinophils Relative: 2 %
HCT: 49.8 % (ref 39.0–52.0)
Hemoglobin: 17.5 g/dL — ABNORMAL HIGH (ref 13.0–17.0)
Lymphocytes Relative: 29 %
Lymphs Abs: 3 10*3/uL (ref 0.7–4.0)
MCH: 30.9 pg (ref 26.0–34.0)
MCHC: 35.1 g/dL (ref 30.0–36.0)
MCV: 88 fL (ref 78.0–100.0)
Monocytes Absolute: 0.7 10*3/uL (ref 0.1–1.0)
Monocytes Relative: 7 %
Neutro Abs: 6.3 10*3/uL (ref 1.7–7.7)
Neutrophils Relative %: 62 %
Platelets: 298 10*3/uL (ref 150–400)
RBC: 5.66 MIL/uL (ref 4.22–5.81)
RDW: 14.2 % (ref 11.5–15.5)
WBC: 10.2 10*3/uL (ref 4.0–10.5)

## 2017-01-27 LAB — COMPREHENSIVE METABOLIC PANEL
ALT: 54 U/L (ref 17–63)
AST: 38 U/L (ref 15–41)
Albumin: 4 g/dL (ref 3.5–5.0)
Alkaline Phosphatase: 71 U/L (ref 38–126)
Anion gap: 7 (ref 5–15)
BUN: 9 mg/dL (ref 6–20)
CO2: 29 mmol/L (ref 22–32)
Calcium: 11.4 mg/dL — ABNORMAL HIGH (ref 8.9–10.3)
Chloride: 103 mmol/L (ref 101–111)
Creatinine, Ser: 0.86 mg/dL (ref 0.61–1.24)
GFR calc Af Amer: >60 mL/min (ref >60)
GFR calc non Af Amer: >60 mL/min (ref >60)
Glucose, Bld: 92 mg/dL (ref 65–99)
Potassium: 4.2 mmol/L (ref 3.5–5.1)
Sodium: 139 mmol/L (ref 135–145)
Total Bilirubin: 1 mg/dL (ref 0.3–1.2)
Total Protein: 6.8 g/dL (ref 6.5–8.1)

## 2017-01-27 MED ORDER — CYCLOBENZAPRINE HCL 10 MG PO TABS
5.0000 mg | ORAL_TABLET | Freq: Once | ORAL | Status: AC
  Administered 2017-01-27: 5 mg via ORAL
  Filled 2017-01-27: qty 1

## 2017-01-27 MED ORDER — ONDANSETRON HCL 4 MG/2ML IJ SOLN
4.0000 mg | Freq: Once | INTRAMUSCULAR | Status: AC
  Administered 2017-01-27: 4 mg via INTRAVENOUS
  Filled 2017-01-27: qty 2

## 2017-01-27 MED ORDER — HYDROMORPHONE HCL 1 MG/ML IJ SOLN
2.0000 mg | Freq: Once | INTRAMUSCULAR | Status: AC
  Administered 2017-01-27: 2 mg via INTRAVENOUS
  Filled 2017-01-27: qty 2

## 2017-01-27 MED ORDER — KETOROLAC TROMETHAMINE 30 MG/ML IJ SOLN
30.0000 mg | Freq: Once | INTRAMUSCULAR | Status: AC
  Administered 2017-01-27: 30 mg via INTRAVENOUS
  Filled 2017-01-27: qty 1

## 2017-01-27 MED ORDER — HYDROMORPHONE HCL 1 MG/ML IJ SOLN
1.0000 mg | Freq: Once | INTRAMUSCULAR | Status: AC
Start: 2017-01-27 — End: 2017-01-27
  Administered 2017-01-27: 1 mg via INTRAVENOUS
  Filled 2017-01-27: qty 1

## 2017-01-27 MED ORDER — CYCLOBENZAPRINE HCL 10 MG PO TABS: 5.0000 mg | ORAL_TABLET | Freq: Two times a day (BID) | ORAL | 0 refills | Status: DC | PRN

## 2017-01-27 NOTE — ED Notes (Signed)
The pt reports that he does not feel any better

## 2017-01-27 NOTE — ED Notes (Signed)
Ed res removed the c-collar
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
## 2017-01-27 NOTE — ED Notes (Signed)
Patient ambulated to restroom with steady gait. Denies dizziness.
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
## 2017-01-27 NOTE — ED Notes (Signed)
Patient transported to CT 

## 2017-02-03 ENCOUNTER — Encounter: Payer: Self-pay | Admitting: Family Medicine

## 2017-02-03 ENCOUNTER — Ambulatory Visit (INDEPENDENT_AMBULATORY_CARE_PROVIDER_SITE_OTHER): Payer: Self-pay | Admitting: Family Medicine

## 2017-02-03 VITALS — BP 122/98 | HR 102 | Temp 98.7°F | Wt 334.5 lb

## 2017-02-03 DIAGNOSIS — G894 Chronic pain syndrome: Secondary | ICD-10-CM

## 2017-02-03 DIAGNOSIS — M549 Dorsalgia, unspecified: Secondary | ICD-10-CM

## 2017-02-03 MED ORDER — HYDROMORPHONE HCL 2 MG PO TABS: 2.0000 mg | ORAL_TABLET | ORAL | 0 refills | Status: DC | PRN

## 2017-02-03 MED ORDER — ONDANSETRON 4 MG PO TBDP: 4.0000 mg | ORAL_TABLET | Freq: Three times a day (TID) | ORAL | 3 refills | Status: DC | PRN

## 2017-02-03 MED ORDER — HYDROCODONE-ACETAMINOPHEN 10-325 MG PO TABS: 1.0000 | ORAL_TABLET | Freq: Three times a day (TID) | ORAL | 0 refills | Status: DC | PRN

## 2017-02-03 MED ORDER — CYCLOBENZAPRINE HCL 10 MG PO TABS: 5.0000 mg | ORAL_TABLET | Freq: Three times a day (TID) | ORAL | 1 refills | Status: AC | PRN
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
## 2017-02-03 NOTE — Patient Instructions (Signed)
Rosaria Ferries will call about your referral. Take care.  Update me in a few days.   Out of work for now.
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
## 2017-02-14 ENCOUNTER — Encounter: Payer: Self-pay | Admitting: Family Medicine

## 2017-02-17 ENCOUNTER — Emergency Department (HOSPITAL_BASED_OUTPATIENT_CLINIC_OR_DEPARTMENT_OTHER)
Admission: EM | Admit: 2017-02-17 | Discharge: 2017-02-17 | Disposition: A | Discharge status: Home / Self Care | Payer: Self-pay | Attending: Emergency Medicine | Admitting: Emergency Medicine

## 2017-02-17 ENCOUNTER — Encounter (HOSPITAL_BASED_OUTPATIENT_CLINIC_OR_DEPARTMENT_OTHER): Payer: Self-pay

## 2017-02-17 ENCOUNTER — Emergency Department (HOSPITAL_BASED_OUTPATIENT_CLINIC_OR_DEPARTMENT_OTHER): Payer: Self-pay

## 2017-02-17 ENCOUNTER — Other Ambulatory Visit: Payer: Self-pay

## 2017-02-17 DIAGNOSIS — Z859 Personal history of malignant neoplasm, unspecified: Secondary | ICD-10-CM | POA: Insufficient documentation

## 2017-02-17 DIAGNOSIS — R110 Nausea: Secondary | ICD-10-CM | POA: Insufficient documentation

## 2017-02-17 DIAGNOSIS — G8929 Other chronic pain: Secondary | ICD-10-CM | POA: Insufficient documentation

## 2017-02-17 DIAGNOSIS — N23 Unspecified renal colic: Secondary | ICD-10-CM | POA: Insufficient documentation

## 2017-02-17 DIAGNOSIS — Z79899 Other long term (current) drug therapy: Secondary | ICD-10-CM | POA: Insufficient documentation

## 2017-02-17 DIAGNOSIS — M545 Low back pain: Secondary | ICD-10-CM | POA: Insufficient documentation

## 2017-02-17 DIAGNOSIS — N201 Calculus of ureter: Secondary | ICD-10-CM | POA: Insufficient documentation

## 2017-02-17 LAB — BASIC METABOLIC PANEL
Anion gap: 8 (ref 5–15)
BUN: 10 mg/dL (ref 6–20)
CO2: 26 mmol/L (ref 22–32)
Calcium: 9.1 mg/dL (ref 8.9–10.3)
Chloride: 105 mmol/L (ref 101–111)
Creatinine, Ser: 1.02 mg/dL (ref 0.61–1.24)
GFR calc Af Amer: >60 mL/min (ref >60)
GFR calc non Af Amer: >60 mL/min (ref >60)
Glucose, Bld: 118 mg/dL — ABNORMAL HIGH (ref 65–99)
Potassium: 3.7 mmol/L (ref 3.5–5.1)
Sodium: 139 mmol/L (ref 135–145)

## 2017-02-17 LAB — URINALYSIS, MICROSCOPIC (REFLEX)

## 2017-02-17 LAB — URINALYSIS, ROUTINE W REFLEX MICROSCOPIC
Bilirubin Urine: NEGATIVE
Glucose, UA: NEGATIVE mg/dL
Ketones, ur: NEGATIVE mg/dL
Leukocytes, UA: NEGATIVE
Nitrite: NEGATIVE
Protein, ur: NEGATIVE mg/dL
Specific Gravity, Urine: 1.025 (ref 1.005–1.030)
pH: 5.5 (ref 5.0–8.0)

## 2017-02-17 LAB — CBC
HCT: 49.1 % (ref 39.0–52.0)
Hemoglobin: 17.4 g/dL — ABNORMAL HIGH (ref 13.0–17.0)
MCH: 30.8 pg (ref 26.0–34.0)
MCHC: 35.4 g/dL (ref 30.0–36.0)
MCV: 86.9 fL (ref 78.0–100.0)
Platelets: 326 10*3/uL (ref 150–400)
RBC: 5.65 MIL/uL (ref 4.22–5.81)
RDW: 13.7 % (ref 11.5–15.5)
WBC: 8.4 10*3/uL (ref 4.0–10.5)

## 2017-02-17 MED ORDER — HYDROMORPHONE HCL 1 MG/ML IJ SOLN
1.0000 mg | INTRAMUSCULAR | Status: DC | PRN
  Administered 2017-02-17: 1 mg via INTRAVENOUS
  Filled 2017-02-17: qty 1

## 2017-02-17 MED ORDER — SODIUM CHLORIDE 0.9 % IV BOLUS (SEPSIS)
1000.0000 mL | Freq: Once | INTRAVENOUS | Status: AC
  Administered 2017-02-17: 1000 mL via INTRAVENOUS

## 2017-02-17 MED ORDER — IBUPROFEN 400 MG PO TABS
600.0000 mg | ORAL_TABLET | Freq: Once | ORAL | Status: AC
  Administered 2017-02-17: 600 mg via ORAL
  Filled 2017-02-17: qty 1

## 2017-02-17 MED ORDER — HYDROMORPHONE HCL 1 MG/ML IJ SOLN
1.0000 mg | Freq: Once | INTRAMUSCULAR | Status: AC
  Administered 2017-02-17: 1 mg via INTRAVENOUS
  Filled 2017-02-17 (×2): qty 1

## 2017-02-17 MED ORDER — OXYCODONE-ACETAMINOPHEN 5-325 MG PO TABS
2.0000 | ORAL_TABLET | Freq: Once | ORAL | Status: AC
  Administered 2017-02-17: 2 via ORAL
  Filled 2017-02-17: qty 2

## 2017-02-17 MED ORDER — ONDANSETRON HCL 4 MG/2ML IJ SOLN
4.0000 mg | Freq: Once | INTRAMUSCULAR | Status: AC
  Administered 2017-02-17: 4 mg via INTRAVENOUS
  Filled 2017-02-17: qty 2

## 2017-02-17 NOTE — ED Provider Notes (Signed)
[facility] Provider Note   CSN: [id number] Arrival date & time: 02/17/17  0125     History   Chief Complaint No chief complaint on file.   HPI Casey Caldwell is a 37 y.o. male.  HPI Patient is a 37 year old male with known history of recurrent ureteral stones.  He has required ureteral procedures before in the past for his colic.  He presents with severe left flank and left-sided abdominal pain with radiation towards his penis.  He has a long-standing history of complex low back pain as well with chronic pain syndrome for which he takes home Dilaudid.  He states this pain is different.  No dysuria or urinary frequency.  No fevers or chills.  He does report some nausea without vomiting.  Pain is severe in severity   Past Medical History:  Diagnosis Date  . Cancer ([place])   . Depression    and panic/anxiety  . GERD (gastroesophageal reflux disease)   . History of kidney stones   . Insomnia   . Kidney stones   . Seizures ([place]) Childhood    Patient Active Problem List   Diagnosis Date Noted  . Encounter for chronic pain management 01/28/2016  . Chronic pain syndrome 10/30/2015  . Adenomatous colon polyp 06/06/2012  . Nephrolithiasis 03/14/2012  . Polycythemia 03/14/2012  . Groin pain 03/14/2012  . RUQ pain 03/14/2012  . Insomnia 03/14/2012  . Seizure disorder ([place]) 03/14/2012  . Anxiety and depression 03/14/2012    Past Surgical History:  Procedure Laterality Date  . APPENDECTOMY  2011  . HYDROCELE EXCISION Left 06/2012  . LAPAROSCOPIC CHOLECYSTECTOMY  1996  . LITHOTRIPSY     four times over the years; most recent 10/08/12  . ORCHIECTOMY Left 11/2012  . SPINAL CORD STIMULATOR INSERTION  2015  . TONSILLECTOMY  1992  . WRIST GANGLION EXCISION  2002   Right       Home Medications    Prior to Admission medications   Medication Sig Start Date End Date Taking? Authorizing Provider  ARIPiprazole (ABILIFY) 5 MG tablet Take 0.5 tablets  (2.5 mg total) by mouth daily. 09/29/16   Tonia Ghent, MD  docusate sodium (COLACE) 100 MG capsule Take 1 capsule (100 mg total) by mouth 2 (two) times daily. 01/27/16   Tonia Ghent, MD  escitalopram (LEXAPRO) 20 MG tablet TAKE ONE TABLET BY MOUTH DAILY 12/30/16   Tonia Ghent, MD  HYDROcodone-acetaminophen [facility]) 10-325 MG tablet Take 1 tablet by mouth 3 (three) times daily as needed. 02/03/17   Tonia Ghent, MD  HYDROmorphone (DILAUDID) 2 MG tablet Take 1 tablet (2 mg total) by mouth every 4 (four) hours as needed for severe pain (for kidney stones). 02/03/17   Tonia Ghent, MD  ibuprofen (ADVIL) 200 MG tablet Take 3 tablets (600 mg total) by mouth 2 (two) times daily as needed. 10/29/15   Tonia Ghent, MD  ondansetron (ZOFRAN ODT) 4 MG disintegrating tablet Take 1 tablet (4 mg total) by mouth every 8 (eight) hours as needed for nausea or vomiting. 02/03/17   Tonia Ghent, MD  pantoprazole (PROTONIX) 40 MG tablet TAKE ONE TABLET BY MOUTH TWICE A DAY 01/27/17   Tonia Ghent, MD  polyethylene glycol powder (GLYCOLAX/MIRALAX) powder MIX 17G IN 4 TO 8 OUNCES OF FLUID AND TAKE TWICE DAILY 02/17/15   Tonia Ghent, MD  tamsulosin (FLOMAX) 0.4 MG CAPS capsule Take 1 capsule (0.4 mg total) by mouth daily.  08/19/16   Volanda Napoleon, PA-C  zolpidem (AMBIEN) 10 MG tablet TAKE 1 TABLET BY MOUTH AT BEDTIME AS NEEDED 01/15/17   Tonia Ghent, MD    Family History Family History  Problem Relation Age of Onset  . Stroke Mother   . Liver disease Mother   . Irritable bowel syndrome Mother   . Kidney disease Mother   . Heart disease Father   . Colon polyps Father   . Colon cancer Neg Hx     Social History Social History   Tobacco Use  . Smoking status: Never Smoker  . Smokeless tobacco: Never Used  Substance Use Topics  . Alcohol use: Yes    Alcohol/week: 0.0 oz    Comment: rarely  . Drug use: No     Allergies   Phenergan [promethazine hcl]; Tape; Toradol  [ketorolac tromethamine]; Trileptal [oxcarbazepine]; and Wellbutrin [bupropion]   Review of Systems Review of Systems  All other systems reviewed and are negative.    Physical Exam Updated Vital Signs BP 114/84 (BP Location: Right Arm)   Pulse 93   Resp 18   Ht 6' (1.829 m)   Wt (!) 149.7 kg (330 lb)   SpO2 99%   BMI 44.76 kg/m   Physical Exam  Constitutional: He is oriented to person, place, and time. He appears well-developed and well-nourished.  HENT:  Head: Normocephalic and atraumatic.  Eyes: EOM are normal.  Neck: Normal range of motion.  Cardiovascular: Normal rate, regular rhythm, normal heart sounds and intact distal pulses.  Pulmonary/Chest: Effort normal and breath sounds normal. No respiratory distress.  Abdominal: Soft. He exhibits no distension. There is no tenderness.  Musculoskeletal: Normal range of motion.  Neurological: He is alert and oriented to person, place, and time.  Skin: Skin is warm and dry.  Psychiatric: He has a normal mood and affect. Judgment normal.  Nursing note and vitals reviewed.    ED Treatments / Results  Labs (all labs ordered are listed, but only abnormal results are displayed) Labs Reviewed  URINALYSIS, ROUTINE W REFLEX MICROSCOPIC - Abnormal; Notable for the following components:      Result Value   Hgb urine dipstick MODERATE (*)    All other components within normal limits  CBC - Abnormal; Notable for the following components:   Hemoglobin 17.4 (*)    All other components within normal limits  BASIC METABOLIC PANEL - Abnormal; Notable for the following components:   Glucose, Bld 118 (*)    All other components within normal limits  URINALYSIS, MICROSCOPIC (REFLEX) - Abnormal; Notable for the following components:   Bacteria, UA FEW (*)    Squamous Epithelial / LPF 0-5 (*)    All other components within normal limits    EKG  EKG Interpretation None       Radiology Ct Renal Stone Study  Result Date:  02/17/2017 CLINICAL DATA:  Penis pain difficulty with urination EXAM: CT ABDOMEN AND PELVIS WITHOUT CONTRAST TECHNIQUE: Multidetector CT imaging of the abdomen and pelvis was performed following the standard protocol without IV contrast. COMPARISON:  10/06/2016, 08/19/2016 FINDINGS: Lower chest: Lung bases demonstrate minimal atelectasis at the lingula. Heart size is normal. Hepatobiliary: Hepatic steatosis. Surgical clips at the gallbladder fossa. Negative for biliary dilatation Pancreas: Unremarkable. No pancreatic ductal dilatation or surrounding inflammatory changes. Spleen: Normal in size without focal abnormality. Adrenals/Urinary Tract: Adrenal glands are within normal limits. Punctate intrarenal stone in the upper pole of the left kidney. Several stones in the right  kidney measuring up to 4 mm in the lower pole on the right. No right hydronephrosis. Mild left hydronephrosis, secondary to a 3 mm stone in the proximal left ureter just past the UPJ. There is an additional punctate stone within the distal left ureter just proximal to the UVJ. Bladder otherwise normal Stomach/Bowel: Stomach is within normal limits. Status post appendectomy. No evidence of bowel wall thickening, distention, or inflammatory changes. Vascular/Lymphatic: No significant vascular findings are present. No enlarged abdominal or pelvic lymph nodes. Reproductive: Prostate is unremarkable. Other: Tiny fat in the left inguinal canal. Negative for free air or free fluid. Small fat in the umbilical region Musculoskeletal: No acute or suspicious abnormality. Spinal stimulators again noted. IMPRESSION: 1. Mild left hydronephrosis secondary to a 3 mm stone in the proximal left ureter just past the left UPJ. Additional punctate stone in the distal left ureter just proximal to the left UVJ. 2. Intrarenal calculi bilaterally. 3. Hepatic steatosis. Electronically Signed   By: Donavan Foil M.D.   On: 02/17/2017 03:09    Procedures Procedures  (including critical care time)  Medications Ordered in ED Medications  HYDROmorphone (DILAUDID) injection 1 mg (1 mg Intravenous Given 02/17/17 0506)  HYDROmorphone (DILAUDID) injection 1 mg (1 mg Intravenous Given 02/17/17 0434)  ondansetron (ZOFRAN) injection 4 mg (4 mg Intravenous Given 02/17/17 0238)  sodium chloride 0.9 % bolus 1,000 mL (0 mLs Intravenous Stopped 02/17/17 0533)  ibuprofen (ADVIL,MOTRIN) tablet 600 mg (600 mg Oral Given 02/17/17 0448)  oxyCODONE-acetaminophen (PERCOCET/ROXICET) 5-325 MG per tablet 2 tablet (2 tablets Oral Given 02/17/17 0448)     Initial Impression / Assessment and Plan / ED Course  I have reviewed the triage vital signs and the nursing notes.  Pertinent labs & imaging results that were available during my care of the patient were reviewed by me and considered in my medical decision making (see chart for details).     5:59 AM Patient feels much better at this time.  Proximal left ureteral stone likely is the cause of his symptoms.  Patient has pain and nausea medication at home.  He will be discharged home at this time.  Standard stone precautions given.  Outpatient urology follow-up.  Patient understands return to the emergency department for new or worsening symptoms  Final Clinical Impressions(s) / ED Diagnoses   Final diagnoses:  Left ureteral stone  Renal colic    ED Discharge Orders    None       Jola Schmidt, MD 02/17/17 [phone]

## 2017-02-17 NOTE — ED Notes (Signed)
Pt still unable to provide urine sample 

## 2017-02-17 NOTE — ED Notes (Signed)
Pt given urinal.

## 2017-02-17 NOTE — ED Notes (Signed)
Pt unable to provide urine sample at this time 

## 2017-02-17 NOTE — ED Notes (Signed)
Pt reports taking his morphine, norco and dilaudid tonight at 2100. Pt drove himself to ER.

## 2017-02-17 NOTE — ED Notes (Signed)
ED Provider at bedside. 

## 2017-02-17 NOTE — ED Notes (Signed)
Requested urine sample. Pt reports "I am unable to give urine without fluids, I can not pee". EDP advised, see MAR. This RN went to administer fluids and pt provided urine sample, refused fluids and stated "I need to get home to take my pain medication, I can not wait much longer".

## 2017-02-17 NOTE — ED Triage Notes (Signed)
Pt reports he "has a kidney stone". Pt reports pain to his penis. Denies flank pain, N/V. Pt denies burning with urination. Pt restless and moaning during assessment. Onset 30 min PTA.

## 2017-02-17 NOTE — ED Notes (Signed)
Pt requesting pain medication and fluids at this time. Pt reports he will get a ride home.

## 2017-02-17 NOTE — ED Notes (Signed)
Offered pt 1mg  dilaudid and enforced that he would have to wait 4 hours before being able to drive himself home. Pt requested 2mg  since it would only be one dose and he would have to wait. EDP advised. Order remained the same. Offered 1mg  dilaudid a second time, pt reports "I have more medication at home, I don't need that".

## 2017-02-21 ENCOUNTER — Ambulatory Visit (INDEPENDENT_AMBULATORY_CARE_PROVIDER_SITE_OTHER): Payer: Self-pay | Admitting: Family Medicine

## 2017-02-21 ENCOUNTER — Encounter: Payer: Self-pay | Admitting: Family Medicine

## 2017-02-21 DIAGNOSIS — G8929 Other chronic pain: Secondary | ICD-10-CM

## 2017-02-21 MED ORDER — HYDROCODONE-ACETAMINOPHEN 10-325 MG PO TABS: 1.0000 | ORAL_TABLET | Freq: Four times a day (QID) | ORAL | 0 refills | Status: DC | PRN

## 2017-02-21 MED ORDER — HYDROMORPHONE HCL 2 MG PO TABS: 2.0000 mg | ORAL_TABLET | ORAL | 0 refills | Status: DC | PRN
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
## 2017-02-21 NOTE — Patient Instructions (Signed)
Don't bend, kneel, squat or lift anything heavy.   Avoid prolonged standing.  Use the pain meds as needed with sedation caution.  Update me as needed.  Take care.  Glad to see you.  Plan on recheck in 1 month.
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
## 2017-02-28 ENCOUNTER — Encounter (HOSPITAL_BASED_OUTPATIENT_CLINIC_OR_DEPARTMENT_OTHER): Payer: Self-pay

## 2017-02-28 ENCOUNTER — Emergency Department (HOSPITAL_BASED_OUTPATIENT_CLINIC_OR_DEPARTMENT_OTHER): Payer: Self-pay

## 2017-02-28 ENCOUNTER — Emergency Department (HOSPITAL_BASED_OUTPATIENT_CLINIC_OR_DEPARTMENT_OTHER)
Admission: EM | Admit: 2017-02-28 | Discharge: 2017-02-28 | Disposition: A | Discharge status: Home / Self Care | Payer: Self-pay | Attending: Emergency Medicine | Admitting: Emergency Medicine

## 2017-02-28 ENCOUNTER — Other Ambulatory Visit: Payer: Self-pay

## 2017-02-28 DIAGNOSIS — R110 Nausea: Secondary | ICD-10-CM | POA: Insufficient documentation

## 2017-02-28 DIAGNOSIS — Z79899 Other long term (current) drug therapy: Secondary | ICD-10-CM | POA: Insufficient documentation

## 2017-02-28 DIAGNOSIS — N50812 Left testicular pain: Secondary | ICD-10-CM | POA: Insufficient documentation

## 2017-02-28 DIAGNOSIS — N200 Calculus of kidney: Secondary | ICD-10-CM | POA: Insufficient documentation

## 2017-02-28 DIAGNOSIS — Z9079 Acquired absence of other genital organ(s): Secondary | ICD-10-CM | POA: Insufficient documentation

## 2017-02-28 DIAGNOSIS — Z8546 Personal history of malignant neoplasm of prostate: Secondary | ICD-10-CM | POA: Insufficient documentation

## 2017-02-28 LAB — COMPREHENSIVE METABOLIC PANEL
ALT: 47 U/L (ref 17–63)
AST: 40 U/L (ref 15–41)
Albumin: 4.1 g/dL (ref 3.5–5.0)
Alkaline Phosphatase: 58 U/L (ref 38–126)
Anion gap: 9 (ref 5–15)
BUN: 12 mg/dL (ref 6–20)
CO2: 27 mmol/L (ref 22–32)
Calcium: 8.8 mg/dL — ABNORMAL LOW (ref 8.9–10.3)
Chloride: 104 mmol/L (ref 101–111)
Creatinine, Ser: 0.82 mg/dL (ref 0.61–1.24)
GFR calc Af Amer: >60 mL/min (ref >60)
GFR calc non Af Amer: >60 mL/min (ref >60)
Glucose, Bld: 92 mg/dL (ref 65–99)
Potassium: 3.6 mmol/L (ref 3.5–5.1)
Sodium: 140 mmol/L (ref 135–145)
Total Bilirubin: 1 mg/dL (ref 0.3–1.2)
Total Protein: 6.9 g/dL (ref 6.5–8.1)

## 2017-02-28 LAB — CBC WITH DIFFERENTIAL/PLATELET
Basophils Absolute: 0.1 10*3/uL (ref 0.0–0.1)
Basophils Relative: 1 %
Eosinophils Absolute: 0.3 10*3/uL (ref 0.0–0.7)
Eosinophils Relative: 3 %
HCT: 44.4 % (ref 39.0–52.0)
Hemoglobin: 15.9 g/dL (ref 13.0–17.0)
Lymphocytes Relative: 29 %
Lymphs Abs: 2.8 10*3/uL (ref 0.7–4.0)
MCH: 30.4 pg (ref 26.0–34.0)
MCHC: 35.8 g/dL (ref 30.0–36.0)
MCV: 84.9 fL (ref 78.0–100.0)
Monocytes Absolute: 0.9 10*3/uL (ref 0.1–1.0)
Monocytes Relative: 10 %
Neutro Abs: 5.5 10*3/uL (ref 1.7–7.7)
Neutrophils Relative %: 57 %
Platelets: 290 10*3/uL (ref 150–400)
RBC: 5.23 MIL/uL (ref 4.22–5.81)
RDW: 13.3 % (ref 11.5–15.5)
WBC: 9.5 10*3/uL (ref 4.0–10.5)

## 2017-02-28 LAB — URINALYSIS, ROUTINE W REFLEX MICROSCOPIC
Bilirubin Urine: NEGATIVE
Glucose, UA: NEGATIVE mg/dL
Ketones, ur: 15 mg/dL — AB
Leukocytes, UA: NEGATIVE
Nitrite: NEGATIVE
Protein, ur: NEGATIVE mg/dL
Specific Gravity, Urine: 1.025 (ref 1.005–1.030)
pH: 6 (ref 5.0–8.0)

## 2017-02-28 LAB — URINALYSIS, MICROSCOPIC (REFLEX): WBC, UA: NONE SEEN WBC/hpf (ref 0–5)

## 2017-02-28 LAB — LIPASE, BLOOD: Lipase: 29 U/L (ref 11–51)

## 2017-02-28 MED ORDER — ONDANSETRON HCL 4 MG/2ML IJ SOLN
4.0000 mg | Freq: Once | INTRAMUSCULAR | Status: AC
  Administered 2017-02-28: 4 mg via INTRAVENOUS
  Filled 2017-02-28: qty 2

## 2017-02-28 MED ORDER — SODIUM CHLORIDE 0.9 % IV BOLUS (SEPSIS)
1000.0000 mL | Freq: Once | INTRAVENOUS | Status: AC
  Administered 2017-02-28: 1000 mL via INTRAVENOUS

## 2017-02-28 MED ORDER — HYDROMORPHONE HCL 1 MG/ML IJ SOLN
1.0000 mg | Freq: Once | INTRAMUSCULAR | Status: AC
  Administered 2017-02-28: 1 mg via INTRAVENOUS
  Filled 2017-02-28: qty 1

## 2017-02-28 MED ORDER — TAMSULOSIN HCL 0.4 MG PO CAPS: 0.4000 mg | ORAL_CAPSULE | Freq: Every day | ORAL | 0 refills | Status: DC
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
## 2017-02-28 NOTE — Discharge Instructions (Signed)
Your ultrasound today confirmed right-sided kidney stone.  No evidence of hydronephrosis or kidney damage.  No evidence of infection.  Please take your home pain medicines and use the Flomax to help pass the stone.  Please use your home nausea medicine and stay hydrated.  Please follow-up with your urology team as well as your PCP.  If any symptoms change or worsen, please return to the nearest emergency department.

## 2017-02-28 NOTE — ED Notes (Signed)
Patient transported to Ultrasound 

## 2017-02-28 NOTE — ED Notes (Signed)
Pt states that he passed his stone on Sunday, and that he noticed pain again in the right groin and right flank with dysuria.

## 2017-02-28 NOTE — ED Notes (Signed)
Pt seated in ED WR-talking on phone-NAD

## 2017-02-28 NOTE — ED Provider Notes (Signed)
[facility] EMERGENCY DEPARTMENT Provider Note   CSN: [id number] Arrival date & time: 02/28/17  1319     History   Chief Complaint Chief Complaint  Patient presents with  . Back Pain    HPI Casey Caldwell is a 37 y.o. male.  The history is provided by the patient and medical records. No language interpreter was used.  Flank Pain  This is a recurrent problem. The current episode started yesterday. The problem occurs constantly. The problem has been gradually worsening. Associated symptoms include abdominal pain. Pertinent negatives include no chest pain, no headaches and no shortness of breath. Nothing aggravates the symptoms. Nothing relieves the symptoms. He has tried nothing for the symptoms. The treatment provided no relief.    Past Medical History:  Diagnosis Date  . Cancer ([place])   . Depression    and panic/anxiety  . GERD (gastroesophageal reflux disease)   . History of kidney stones   . Insomnia   . Kidney stones   . Seizures ([place]) Childhood    Patient Active Problem List   Diagnosis Date Noted  . Encounter for chronic pain management 01/28/2016  . Chronic pain syndrome 10/30/2015  . Adenomatous colon polyp 06/06/2012  . Nephrolithiasis 03/14/2012  . Polycythemia 03/14/2012  . Groin pain 03/14/2012  . RUQ pain 03/14/2012  . Insomnia 03/14/2012  . Seizure disorder ([place]) 03/14/2012  . Anxiety and depression 03/14/2012    Past Surgical History:  Procedure Laterality Date  . APPENDECTOMY  2011  . HYDROCELE EXCISION Left 06/2012  . LAPAROSCOPIC CHOLECYSTECTOMY  1996  . LITHOTRIPSY     four times over the years; most recent 10/08/12  . ORCHIECTOMY Left 11/2012  . SPINAL CORD STIMULATOR INSERTION  2015  . TONSILLECTOMY  1992  . WRIST GANGLION EXCISION  2002   Right       Home Medications    Prior to Admission medications   Medication Sig Start Date End Date Taking? Authorizing Provider  ARIPiprazole (ABILIFY) 5 MG tablet Take 0.5 tablets  (2.5 mg total) by mouth daily. 09/29/16   Tonia Ghent, MD  docusate sodium (COLACE) 100 MG capsule Take 1 capsule (100 mg total) by mouth 2 (two) times daily. 01/27/16   Tonia Ghent, MD  escitalopram (LEXAPRO) 20 MG tablet TAKE ONE TABLET BY MOUTH DAILY 12/30/16   Tonia Ghent, MD  HYDROcodone-acetaminophen [facility]) 10-325 MG tablet Take 1 tablet by mouth every 6 (six) hours as needed. 02/21/17   Tonia Ghent, MD  HYDROmorphone (DILAUDID) 2 MG tablet Take 1 tablet (2 mg total) by mouth every 4 (four) hours as needed for severe pain (for kidney stones). 02/21/17   Tonia Ghent, MD  ibuprofen (ADVIL) 200 MG tablet Take 3 tablets (600 mg total) by mouth 2 (two) times daily as needed. 10/29/15   Tonia Ghent, MD  ondansetron (ZOFRAN ODT) 4 MG disintegrating tablet Take 1 tablet (4 mg total) by mouth every 8 (eight) hours as needed for nausea or vomiting. 02/03/17   Tonia Ghent, MD  pantoprazole (PROTONIX) 40 MG tablet TAKE ONE TABLET BY MOUTH TWICE A DAY 01/27/17   Tonia Ghent, MD  polyethylene glycol powder (GLYCOLAX/MIRALAX) powder MIX 17G IN 4 TO 8 OUNCES OF FLUID AND TAKE TWICE DAILY 02/17/15   Tonia Ghent, MD  tamsulosin (FLOMAX) 0.4 MG CAPS capsule Take 1 capsule (0.4 mg total) by mouth daily. 08/19/16   Volanda Napoleon, PA-C  zolpidem (AMBIEN) 10 MG tablet  TAKE 1 TABLET BY MOUTH AT BEDTIME AS NEEDED 01/15/17   Tonia Ghent, MD    Family History Family History  Problem Relation Age of Onset  . Stroke Mother   . Liver disease Mother   . Irritable bowel syndrome Mother   . Kidney disease Mother   . Heart disease Father   . Colon polyps Father   . Colon cancer Neg Hx     Social History Social History   Tobacco Use  . Smoking status: Never Smoker  . Smokeless tobacco: Never Used  Substance Use Topics  . Alcohol use: Yes    Alcohol/week: 0.0 oz    Comment: rarely  . Drug use: No     Allergies   Phenergan [promethazine hcl]; Tape; Toradol  [ketorolac tromethamine]; Trileptal [oxcarbazepine]; and Wellbutrin [bupropion]   Review of Systems Review of Systems  Constitutional: Negative for chills, diaphoresis, fatigue and fever.  HENT: Negative for congestion.   Respiratory: Negative for cough, chest tightness, shortness of breath, wheezing and stridor.   Cardiovascular: Negative for chest pain.  Gastrointestinal: Positive for abdominal pain.  Genitourinary: Positive for flank pain, genital sores and testicular pain. Negative for frequency, penile pain, penile swelling and scrotal swelling.  Musculoskeletal: Negative for back pain, neck pain and neck stiffness.  Neurological: Negative for light-headedness and headaches.  Psychiatric/Behavioral: Negative for agitation.  All other systems reviewed and are negative.    Physical Exam Updated Vital Signs BP (!) 157/117 (BP Location: Left Arm)   Pulse 98   Temp 98.1 F (36.7 C) (Oral)   Resp 20   Ht 6' (1.829 m)   Wt (!) 154.2 kg (340 lb)   SpO2 96%   BMI 46.11 kg/m   Physical Exam  Constitutional: He is oriented to person, place, and time. He appears well-developed and well-nourished. No distress.  HENT:  Head: Normocephalic.  Nose: Nose normal.  Mouth/Throat: Oropharynx is clear and moist. No oropharyngeal exudate.  Eyes: Conjunctivae and EOM are normal. Pupils are equal, round, and reactive to light.  Neck: Normal range of motion.  Cardiovascular: Normal rate and intact distal pulses.  No murmur heard. Pulmonary/Chest: Effort normal. No respiratory distress. He has no wheezes. He exhibits no tenderness.  Abdominal: Soft. Normal appearance. He exhibits no distension. There is tenderness in the right lower quadrant. There is no rigidity, no rebound, no guarding and no CVA tenderness.    Genitourinary: Penis normal. Right testis shows tenderness. Right testis shows no swelling. Cremasteric reflex is not absent on the right side. No penile tenderness.  Genitourinary  Comments: Left testicle absent.  Tenderness of the right testicle.  No swelling or redness.  Musculoskeletal: He exhibits tenderness.       Thoracic back: He exhibits tenderness.       Back:  Neurological: He is alert and oriented to person, place, and time. No sensory deficit. He exhibits normal muscle tone.  Skin: Capillary refill takes less than 2 seconds. He is not diaphoretic. No erythema. No pallor.  Nursing note and vitals reviewed.    ED Treatments / Results  Labs (all labs ordered are listed, but only abnormal results are displayed) Labs Reviewed  URINALYSIS, ROUTINE W REFLEX MICROSCOPIC - Abnormal; Notable for the following components:      Result Value   Hgb urine dipstick MODERATE (*)    Ketones, ur 15 (*)    All other components within normal limits  COMPREHENSIVE METABOLIC PANEL - Abnormal; Notable for the following components:  Calcium 8.8 (*)    All other components within normal limits  URINALYSIS, MICROSCOPIC (REFLEX) - Abnormal; Notable for the following components:   Bacteria, UA RARE (*)    Squamous Epithelial / LPF 0-5 (*)    All other components within normal limits  URINE CULTURE  CBC WITH DIFFERENTIAL/PLATELET  LIPASE, BLOOD    EKG  EKG Interpretation None       Radiology US Renal  Result Date: 02/28/2017 CLINICAL DATA:  Acute bilateral flank and groin pain. EXAM: RENAL / URINARY TRACT ULTRASOUND COMPLETE COMPARISON:  CT scan of February 17, 2017. Ultrasound of November 15, 2016. FINDINGS: Right Kidney: Length: 13.9 cm. 6 mm calculus is noted. Echogenicity within normal limits. No mass or hydronephrosis visualized. Left Kidney: Length: 13.2 cm. 7 mm calculus is noted. Echogenicity within normal limits. No mass or hydronephrosis visualized. Bladder: Appears normal for degree of bladder distention. 5 mm calculus is noted at right ureterovesical junction. IMPRESSION: Bilateral nephrolithiasis. No hydronephrosis or renal obstruction is noted. Possible 5 mm  calculus at right ureterovesical junction. Electronically Signed   By: Marijo Conception, M.D.   On: 02/28/2017 19:21   US Scrotum W/doppler  Result Date: 02/28/2017 CLINICAL DATA:  Right groin pain history of left orchiectomy EXAM: SCROTAL ULTRASOUND DOPPLER ULTRASOUND OF THE TESTICLES TECHNIQUE: Complete ultrasound examination of the testicles, epididymis, and other scrotal structures was performed. Color and spectral Doppler ultrasound were also utilized to evaluate blood flow to the testicles. COMPARISON:  Ultrasound 11/15/2016 FINDINGS: Right testicle Measurements: 4.7 x 2.4 x 3.4 cm. No mass or microlithiasis visualized. Left testicle Surgically absent Right epididymis:  Normal in size and appearance. Left epididymis:  Surgically absent Hydrocele:  Tiny hydrocele at the upper pole of the right testicle. Varicocele:  None visualized. Pulsed Doppler interrogation of both testes demonstrates normal low resistance arterial and venous waveforms bilaterally. IMPRESSION: 1. Status post left orchiectomy 2. Normal ultrasound appearance of the right testis with no evidence for torsion or mass. Tiny right hydrocele Electronically Signed   By: Donavan Foil M.D.   On: 02/28/2017 19:10    Procedures Procedures (including critical care time)  Medications Ordered in ED Medications  sodium chloride 0.9 % bolus 1,000 mL (0 mLs Intravenous Stopped 02/28/17 1905)  HYDROmorphone (DILAUDID) injection 1 mg (1 mg Intravenous Given 02/28/17 1734)  ondansetron (ZOFRAN) injection 4 mg (4 mg Intravenous Given 02/28/17 1735)  HYDROmorphone (DILAUDID) injection 1 mg (1 mg Intravenous Given 02/28/17 1905)  HYDROmorphone (DILAUDID) injection 1 mg (1 mg Intravenous Given 02/28/17 2034)     Initial Impression / Assessment and Plan / ED Course  I have reviewed the triage vital signs and the nursing notes.  Pertinent labs & imaging results that were available during my care of the patient were reviewed by me and considered in my  medical decision making (see chart for details).     Casey Caldwell is a 37 y.o. male with a past medical history significant for numerous kidney stones as well as lithotripsy, left testicle cancer status post removal, and GERD who presents with right flank pain and right groin pain.  Patient reports that symptoms feel similar to prior kidney stones.  He reports that he has been suffering from kidney stones for years but has had recurrent episodes over the last few months.  He reports several CT scans showing multiple stones in his kidneys.  He says that he is primarily having pain on his right flank and groin.  He denies any  dysuria or hematuria grossly.  He denies any vomiting but does report nausea.  He denies fevers, chills, or respiratory chest symptoms.  He denies other abdominal pains.  Patient describes the pain as sharp, cramping, and severe.  It is 10 out of 10 on arrival.  He reports that he goes to a pain clinic in his home pain medicines have not helped with his symptoms.  On exam, patient has right flank tenderness.  GU exam was performed with a chaperone with no left testicle present.  Right testicle was tender but no hernias were appreciated.  Penis normal.  Abdomen slightly tender in the right flank area.  Exam otherwise unremarkable.  Shared decision making conversation held with patient as to ultrasound versus CT scan.  As patient has had multiple CT scans he requests ultrasound at this time.  Patient's ultrasound was ordered to rule out significant hydronephrosis.    Ultrasound showed multiple kidney stones in the kidneys and a right UVJ stone was present.  No evidence of obstruction.  Laboratory testing showed no evidence of kidney dysfunction and no convincing evidence of infected stone or UTI.  Patient reports his home pain medicine will be adequate and he has home nausea medicine.  He did request Flomax and he will follow-up with urology for further management.  Patient  understood return precautions for new or worsened symptoms.  Do not feel patient has appendicitis as this fits more with his kidney stone picture.  Patient had no other questions or concerns and was discharged in good condition.    Final Clinical Impressions(s) / ED Diagnoses   Final diagnoses:  Kidney stone on right side  Nausea    ED Discharge Orders        Ordered    tamsulosin (FLOMAX) 0.4 MG CAPS capsule  Daily     02/28/17 2031      Clinical Impression: 1. Kidney stone on right side   2. Nausea     Disposition: Discharge  Condition: Good  I have discussed the results, Dx and Tx plan with the pt(& family if present). He/she/they expressed understanding and agree(s) with the plan. Discharge instructions discussed at great length. Strict return precautions discussed and pt &/or family have verbalized understanding of the instructions. No further questions at time of discharge.    Discharge Medication List as of 02/28/2017  8:32 PM    START taking these medications   Details  !! tamsulosin (FLOMAX) 0.4 MG CAPS capsule Take 1 capsule (0.4 mg total) by mouth daily., Starting Tue 02/28/2017, Print     !! - Potential duplicate medications found. Please discuss with provider.      Follow Up: Tonia Ghent, MD [place] [address] [place] [address] [id number] [facility] [place] [phone]       Calia Napp, Gwenyth Allegra, MD 03/01/17 [phone]

## 2017-02-28 NOTE — ED Triage Notes (Signed)
C/o "kidney stone"-pain to lower back and right groin x 2 days-slow gait

## 2017-03-02 LAB — URINE CULTURE: Culture: NO GROWTH

## 2017-03-21 ENCOUNTER — Ambulatory Visit: Payer: Self-pay | Admitting: Family Medicine

## 2017-03-23 ENCOUNTER — Ambulatory Visit: Payer: Self-pay | Admitting: Family Medicine

## 2017-03-23 ENCOUNTER — Encounter: Payer: Self-pay | Admitting: Family Medicine

## 2017-03-23 VITALS — BP 122/88 | HR 104 | Temp 98.7°F | Wt 336.5 lb

## 2017-03-23 DIAGNOSIS — G894 Chronic pain syndrome: Secondary | ICD-10-CM

## 2017-03-23 DIAGNOSIS — F32A Depression, unspecified: Secondary | ICD-10-CM

## 2017-03-23 DIAGNOSIS — F419 Anxiety disorder, unspecified: Secondary | ICD-10-CM

## 2017-03-23 DIAGNOSIS — F329 Major depressive disorder, single episode, unspecified: Secondary | ICD-10-CM

## 2017-03-23 MED ORDER — HYDROMORPHONE HCL 2 MG PO TABS: 2.0000 mg | ORAL_TABLET | ORAL | 0 refills | Status: DC | PRN

## 2017-03-23 MED ORDER — ARIPIPRAZOLE 5 MG PO TABS: 2.5000 mg | ORAL_TABLET | Freq: Every day | ORAL | 0 refills | Status: DC

## 2017-03-23 MED ORDER — HYDROCODONE-ACETAMINOPHEN 10-325 MG PO TABS: 1.0000 | ORAL_TABLET | Freq: Four times a day (QID) | ORAL | 0 refills | Status: DC | PRN

## 2017-03-23 NOTE — Patient Instructions (Addendum)
Check with the pain clinic when you can and continue with counseling.  Use ice for 5 minutes at a time on your right foot.   Take care.  Update me as needed.
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
## 2017-03-27 ENCOUNTER — Encounter: Payer: Self-pay | Admitting: Family Medicine
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
## 2017-03-27 NOTE — Assessment & Plan Note (Signed)
Continue counseling and current meds.  No Si/Hi.  Okay for outpatient f/u.

## 2017-04-10 ENCOUNTER — Encounter (HOSPITAL_BASED_OUTPATIENT_CLINIC_OR_DEPARTMENT_OTHER): Payer: Self-pay

## 2017-04-10 ENCOUNTER — Emergency Department (HOSPITAL_BASED_OUTPATIENT_CLINIC_OR_DEPARTMENT_OTHER)
Admission: EM | Admit: 2017-04-10 | Discharge: 2017-04-10 | Disposition: A | Discharge status: Home / Self Care | Payer: Self-pay | Attending: Emergency Medicine | Admitting: Emergency Medicine

## 2017-04-10 ENCOUNTER — Emergency Department (HOSPITAL_BASED_OUTPATIENT_CLINIC_OR_DEPARTMENT_OTHER): Payer: Self-pay

## 2017-04-10 ENCOUNTER — Other Ambulatory Visit: Payer: Self-pay

## 2017-04-10 DIAGNOSIS — N50812 Left testicular pain: Secondary | ICD-10-CM | POA: Insufficient documentation

## 2017-04-10 DIAGNOSIS — Z79899 Other long term (current) drug therapy: Secondary | ICD-10-CM | POA: Insufficient documentation

## 2017-04-10 DIAGNOSIS — N50819 Testicular pain, unspecified: Secondary | ICD-10-CM

## 2017-04-10 DIAGNOSIS — R109 Unspecified abdominal pain: Secondary | ICD-10-CM

## 2017-04-10 DIAGNOSIS — R1012 Left upper quadrant pain: Secondary | ICD-10-CM | POA: Insufficient documentation

## 2017-04-10 LAB — CBC WITH DIFFERENTIAL/PLATELET
Basophils Absolute: 0 10*3/uL (ref 0.0–0.1)
Basophils Relative: 0 %
Eosinophils Absolute: 0.2 10*3/uL (ref 0.0–0.7)
Eosinophils Relative: 2 %
HCT: 47.7 % (ref 39.0–52.0)
Hemoglobin: 17 g/dL (ref 13.0–17.0)
Lymphocytes Relative: 26 %
Lymphs Abs: 2.6 10*3/uL (ref 0.7–4.0)
MCH: 30.5 pg (ref 26.0–34.0)
MCHC: 35.6 g/dL (ref 30.0–36.0)
MCV: 85.6 fL (ref 78.0–100.0)
Monocytes Absolute: 1.2 10*3/uL — ABNORMAL HIGH (ref 0.1–1.0)
Monocytes Relative: 12 %
Neutro Abs: 5.9 10*3/uL (ref 1.7–7.7)
Neutrophils Relative %: 60 %
Platelets: 337 10*3/uL (ref 150–400)
RBC: 5.57 MIL/uL (ref 4.22–5.81)
RDW: 13.6 % (ref 11.5–15.5)
WBC: 9.9 10*3/uL (ref 4.0–10.5)

## 2017-04-10 LAB — BASIC METABOLIC PANEL
Anion gap: 9 (ref 5–15)
BUN: 14 mg/dL (ref 6–20)
CO2: 25 mmol/L (ref 22–32)
Calcium: 9.1 mg/dL (ref 8.9–10.3)
Chloride: 105 mmol/L (ref 101–111)
Creatinine, Ser: 0.82 mg/dL (ref 0.61–1.24)
GFR calc Af Amer: >60 mL/min (ref >60)
GFR calc non Af Amer: >60 mL/min (ref >60)
Glucose, Bld: 113 mg/dL — ABNORMAL HIGH (ref 65–99)
Potassium: 3.5 mmol/L (ref 3.5–5.1)
Sodium: 139 mmol/L (ref 135–145)

## 2017-04-10 LAB — URINALYSIS, ROUTINE W REFLEX MICROSCOPIC
Bilirubin Urine: NEGATIVE
Glucose, UA: NEGATIVE mg/dL
Ketones, ur: NEGATIVE mg/dL
Leukocytes, UA: NEGATIVE
Nitrite: NEGATIVE
Protein, ur: NEGATIVE mg/dL
Specific Gravity, Urine: 1.025 (ref 1.005–1.030)
pH: 6 (ref 5.0–8.0)

## 2017-04-10 LAB — URINALYSIS, MICROSCOPIC (REFLEX)

## 2017-04-10 MED ORDER — HYDROMORPHONE HCL 1 MG/ML IJ SOLN
1.0000 mg | Freq: Once | INTRAMUSCULAR | Status: AC
  Administered 2017-04-10: 1 mg via INTRAVENOUS
  Filled 2017-04-10: qty 1

## 2017-04-10 MED ORDER — SODIUM CHLORIDE 0.9 % IV BOLUS (SEPSIS)
1000.0000 mL | Freq: Once | INTRAVENOUS | Status: AC
  Administered 2017-04-10: 1000 mL via INTRAVENOUS

## 2017-04-10 MED ORDER — MORPHINE SULFATE (PF) 2 MG/ML IV SOLN
2.0000 mg | Freq: Once | INTRAVENOUS | Status: AC
  Administered 2017-04-10: 2 mg via INTRAVENOUS
  Filled 2017-04-10: qty 1

## 2017-04-10 MED ORDER — HYDROMORPHONE HCL 1 MG/ML IJ SOLN
0.5000 mg | Freq: Once | INTRAMUSCULAR | Status: AC
  Administered 2017-04-10: 0.5 mg via INTRAVENOUS
  Filled 2017-04-10: qty 1

## 2017-04-10 MED ORDER — MORPHINE SULFATE (PF) 4 MG/ML IV SOLN
4.0000 mg | Freq: Once | INTRAVENOUS | Status: AC
  Administered 2017-04-10: 4 mg via INTRAVENOUS
  Filled 2017-04-10: qty 1

## 2017-04-10 MED ORDER — ONDANSETRON HCL 4 MG/2ML IJ SOLN
4.0000 mg | Freq: Once | INTRAMUSCULAR | Status: AC
  Administered 2017-04-10: 4 mg via INTRAVENOUS
  Filled 2017-04-10: qty 2

## 2017-04-10 MED ORDER — IBUPROFEN 400 MG PO TABS
600.0000 mg | ORAL_TABLET | Freq: Once | ORAL | Status: AC
  Administered 2017-04-10: 600 mg via ORAL
  Filled 2017-04-10: qty 1

## 2017-04-10 NOTE — ED Triage Notes (Signed)
C/o left flank, abd, groin pain-states feels like a kidney stone-NAD-slow gait

## 2017-04-10 NOTE — Discharge Instructions (Signed)
You have been diagnosed with kidney stones.  Drink plenty of fluids to help you pass the stone.  Take  ibuprofen / naproxen as directed with food for mild to moderate pain. Use your pain medication as directed and only as needed for severe pain. Taking flomax as directed will also help to pass the stone. Use Zofran for nausea as directed.  Follow up with the urology clinic listed in regards to your hospital visit.   Return to the ED immediately if you develop fever that persists > 101, uncontrolled pain or vomiting, or other concerns.   Do not drink alcohol, drive or participate in any other potentially dangerous activities while taking opiate pain medication as it may make you sleepy. Do not take this medication with any other sedating medications, either prescription or over-the-counter. If you were prescribed Percocet or Vicodin, do not take these with acetaminophen (Tylenol) as it is already contained within these medications.   This medication is an opiate (or narcotic) pain medication and can be habit forming.  Use it as little as possible to achieve adequate pain control.  Do not use or use it with extreme caution if you have a history of opiate abuse or dependence. This medication is intended for your use only - do not give any to anyone else and keep it in a secure place where nobody else, especially children, have access to it. It will also cause or worsen constipation, so you may want to consider taking an over-the-counter stool softener while you are taking this medication.  

## 2017-04-10 NOTE — ED Notes (Signed)
PT in Ultrasound

## 2017-04-10 NOTE — ED Notes (Signed)
Pt not in ED WR when called for tx area

## 2017-04-10 NOTE — ED Notes (Signed)
Pt states he was in BR when name called-notified he would be taken to tx area when another bed available

## 2017-04-11 NOTE — ED Provider Notes (Signed)
[facility] EMERGENCY DEPARTMENT Provider Note   CSN: [id number] Arrival date & time: 04/10/17  1222     History   Chief Complaint Chief Complaint  Patient presents with  . Flank Pain    HPI Casey Caldwell is a 37 y.o. male.  HPI 37 year old Caucasian male past medical history significant for chronic pain, chronic kidney stones that presents to the emergency department today for evaluation of left flank pain and testicular pain.  Patient states that his symptoms started this morning when he awoke from sleep.  States that he has known kidney stones and recently passed 3 kidney stones a few weeks ago.  Patient states this typically is his pain for his kidney stones.  He reports that the pain was initially in the left flank that radiates to the testicle.  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  Patient states that he was released from his urology office for drug-seeking behavior.  The patient does report being on chronic pain medication including 40 mg of hydrocodone daily.  Patient states to use this today without any relief.  Patient reports some nausea but denies any emesis.  Denies any associated urinary symptoms, change in bowel habits, fevers.  Nothing makes his symptoms better or worse.  She has asked that we avoid CT scan at this time as he knows he has kidney stones.  Not currently see an urologist. Past Medical History:  Diagnosis Date  . Depression    and panic/anxiety  . GERD (gastroesophageal reflux disease)   . History of kidney stones   . Insomnia   . Kidney stones   . Seizures ([place]) Childhood    Patient Active Problem List   Diagnosis Date Noted  . Encounter for chronic pain management 01/28/2016  . Chronic pain syndrome 10/30/2015  . Adenomatous colon polyp 06/06/2012  . Nephrolithiasis 03/14/2012  . Polycythemia 03/14/2012  . Groin pain 03/14/2012  . RUQ pain 03/14/2012  . Insomnia 03/14/2012  . Seizure  disorder ([place]) 03/14/2012  . Anxiety and depression 03/14/2012    Past Surgical History:  Procedure Laterality Date  . APPENDECTOMY  2011  . CHOLECYSTECTOMY, LAPAROSCOPIC    . HYDROCELE EXCISION Left 06/2012  . LAPAROSCOPIC CHOLECYSTECTOMY  1996  . LITHOTRIPSY     four times over the years; most recent 10/08/12  . ORCHIECTOMY Left 11/2012  . SPINAL CORD STIMULATOR INSERTION  2015  . TONSILLECTOMY  1992  . WRIST GANGLION EXCISION  2002   Right       Home Medications    Prior to Admission medications   Medication Sig Start Date End Date Taking? Authorizing Provider  ARIPiprazole (ABILIFY) 5 MG tablet Take 0.5 tablets (2.5 mg total) by mouth daily. 03/23/17   Tonia Ghent, MD  docusate sodium (COLACE) 100 MG capsule Take 1 capsule (100 mg total) by mouth 2 (two) times daily. 01/27/16   Tonia Ghent, MD  escitalopram (LEXAPRO) 20 MG tablet TAKE ONE TABLET BY MOUTH DAILY 12/30/16   Tonia Ghent, MD  HYDROcodone-acetaminophen [facility]) 10-325 MG tablet Take 1 tablet by mouth every 6 (six) hours as needed. 03/23/17   Tonia Ghent, MD  HYDROmorphone (DILAUDID) 2 MG tablet Take 1 tablet (2 mg total) by mouth every 4 (four) hours as needed for severe pain (for kidney stones). 03/23/17   Tonia Ghent, MD  ibuprofen (ADVIL) 200 MG tablet Take 3 tablets (600 mg total) by mouth 2 (  two) times daily as needed. 10/29/15   Tonia Ghent, MD  ondansetron (ZOFRAN ODT) 4 MG disintegrating tablet Take 1 tablet (4 mg total) by mouth every 8 (eight) hours as needed for nausea or vomiting. 02/03/17   Tonia Ghent, MD  pantoprazole (PROTONIX) 40 MG tablet TAKE ONE TABLET BY MOUTH TWICE A DAY 01/27/17   Tonia Ghent, MD  polyethylene glycol powder (GLYCOLAX/MIRALAX) powder MIX 17G IN 4 TO 8 OUNCES OF FLUID AND TAKE TWICE DAILY 02/17/15   Tonia Ghent, MD  tamsulosin (FLOMAX) 0.4 MG CAPS capsule Take 1 capsule (0.4 mg total) by mouth daily. 02/28/17   Tegeler, Gwenyth Allegra, MD  zolpidem  (AMBIEN) 10 MG tablet TAKE 1 TABLET BY MOUTH AT BEDTIME AS NEEDED 01/15/17   Tonia Ghent, MD    Family History Family History  Problem Relation Age of Onset  . Stroke Mother   . Liver disease Mother   . Irritable bowel syndrome Mother   . Kidney disease Mother   . Heart disease Father   . Colon polyps Father   . Colon cancer Neg Hx     Social History Social History   Tobacco Use  . Smoking status: Never Smoker  . Smokeless tobacco: Never Used  Substance Use Topics  . Alcohol use: Yes    Alcohol/week: 0.0 oz    Comment: rarely  . Drug use: No     Allergies   Phenergan [promethazine hcl]; Tape; Toradol [ketorolac tromethamine]; Trileptal [oxcarbazepine]; and Wellbutrin [bupropion]   Review of Systems Review of Systems  All other systems reviewed and are negative.    Physical Exam Updated Vital Signs BP (!) 141/99 (BP Location: Right Arm)   Pulse 84   Temp 98.1 F (36.7 C) (Oral)   Resp 18   Ht 6' (1.829 m)   Wt (!) 153.8 kg (339 lb)   SpO2 93%   BMI 45.98 kg/m   Physical Exam  Constitutional: He is oriented to person, place, and time. He appears well-developed and well-nourished.  Non-toxic appearance. No distress.  Patient does appear uncomfortable laying on the bed holding his left flank.  HENT:  Head: Normocephalic and atraumatic.  Mouth/Throat: Oropharynx is clear and moist.  Eyes: Conjunctivae are normal. Pupils are equal, round, and reactive to light. Right eye exhibits no discharge. Left eye exhibits no discharge.  Neck: Normal range of motion. Neck supple.  Cardiovascular: Normal rate, regular rhythm, normal heart sounds and intact distal pulses. Exam reveals no gallop and no friction rub.  No murmur heard. Pulmonary/Chest: Effort normal and breath sounds normal. No respiratory distress. He exhibits no tenderness.  Abdominal: Soft. Bowel sounds are normal. He exhibits no distension. There is tenderness in the left lower quadrant. There is CVA  tenderness. There is no rigidity, no rebound, no guarding, no tenderness at McBurney's point (left) and negative Murphy's sign.  Genitourinary:  Genitourinary Comments: Chaperone present for exam. Circumcised male. No penile discharge, erythema, tenderness, lesion, or rash. 1 descended testes with one testicle missing due to removal several years ago.  Patient does have some tenderness to palpation of the right testicle without any associated swelling.  No inguinal lymphadenopathy or hernia.    Musculoskeletal: Normal range of motion. He exhibits no tenderness.  Lymphadenopathy:    He has no cervical adenopathy.  Neurological: He is alert and oriented to person, place, and time.  Skin: Skin is warm and dry. Capillary refill takes less than 2 seconds. No rash noted.  Psychiatric: His behavior is normal. Judgment and thought content normal.  Nursing note and vitals reviewed.    ED Treatments / Results  Labs (all labs ordered are listed, but only abnormal results are displayed) Labs Reviewed  URINALYSIS, ROUTINE W REFLEX MICROSCOPIC - Abnormal; Notable for the following components:      Result Value   APPearance CLOUDY (*)    Hgb urine dipstick LARGE (*)    All other components within normal limits  URINALYSIS, MICROSCOPIC (REFLEX) - Abnormal; Notable for the following components:   Bacteria, UA FEW (*)    Squamous Epithelial / LPF 0-5 (*)    All other components within normal limits  BASIC METABOLIC PANEL - Abnormal; Notable for the following components:   Glucose, Bld 113 (*)    All other components within normal limits  CBC WITH DIFFERENTIAL/PLATELET - Abnormal; Notable for the following components:   Monocytes Absolute 1.2 (*)    All other components within normal limits    EKG  EKG Interpretation None       Radiology Dg Abdomen 1 View  Result Date: 04/10/2017 CLINICAL DATA:  Left flank pain with history of kidney stone EXAM: ABDOMEN - 1 VIEW COMPARISON:  Ultrasound  04/10/2017, CT 02/17/2017 FINDINGS: Gas pattern is nonobstructed. Calcified phleboliths in the right pelvis. Spinal stimulator with staggered leads projecting over the lower thoracic and upper lumbar spine. Surgical clips in the right upper quadrant. Possible faint calcification to the left of L2. IMPRESSION: 1. Possible faint calcification to the left of L2, raises possibility for proximal ureteral stone; suggest CT KUB to further evaluate 2. Nonobstructed gas pattern Electronically Signed   By: Donavan Foil M.D.   On: 04/10/2017 15:19   US Renal  Result Date: 04/10/2017 CLINICAL DATA:  37 year old male with intermittent left flank, abdominal and groin pain. History kidney stones. Initial encounter. EXAM: RENAL / URINARY TRACT ULTRASOUND COMPLETE COMPARISON:  02/28/2017 ultrasound.  02/17/2017 CT. FINDINGS: Right Kidney: Length: 13.2 cm. Echogenicity within normal limits. No hydronephrosis. Upper pole 1.3 x 1.2 x 1.6 cm cyst possibly minimally complex but evaluation limited by patient's habitus. Left Kidney: Length: 12.9 cm. Echogenicity within normal limits. Mild hydronephrosis. No discrete stone noted. Bladder: Appears normal for degree of bladder distention. Bilateral ureteral jets visualized. IMPRESSION: Mild left-sided hydronephrosis. Right upper pole 1.6 cm cyst possibly minimally complex although evaluation limited by habitus. Electronically Signed   By: Genia Del M.D.   On: 04/10/2017 15:17   US Scrotum W/doppler  Result Date: 04/10/2017 CLINICAL DATA:  Right testicle pain EXAM: SCROTAL ULTRASOUND DOPPLER ULTRASOUND OF THE TESTICLES TECHNIQUE: Complete ultrasound examination of the testicles, epididymis, and other scrotal structures was performed. Color and spectral Doppler ultrasound were also utilized to evaluate blood flow to the testicles. COMPARISON:  02/28/2017 FINDINGS: Right testicle Measurements: 4.5 x 2.4 x 3.2 cm. No mass or microlithiasis visualized. Left testicle Surgically absent  Right epididymis:  Normal in size and appearance. Left epididymis:  Surgically absent Hydrocele:  None visualized. Varicocele:  None visualized. Pulsed Doppler interrogation of both testes demonstrates normal low resistance arterial and venous waveforms bilaterally. IMPRESSION: 1. Status post left orchiectomy 2. Normal ultrasound appearance of the right testis. Negative for torsion. Electronically Signed   By: Donavan Foil M.D.   On: 04/10/2017 15:15    Procedures Procedures (including critical care time)  Medications Ordered in ED Medications  sodium chloride 0.9 % bolus 1,000 mL (0 mLs Intravenous Stopped 04/10/17 1419)  morphine 4 MG/ML injection 4 mg (  4 mg Intravenous Given 04/10/17 1332)  ondansetron (ZOFRAN) injection 4 mg (4 mg Intravenous Given 04/10/17 1332)  ibuprofen (ADVIL,MOTRIN) tablet 600 mg (600 mg Oral Given 04/10/17 1350)  morphine 2 MG/ML injection 2 mg (2 mg Intravenous Given 04/10/17 1350)  HYDROmorphone (DILAUDID) injection 1 mg (1 mg Intravenous Given 04/10/17 1417)  HYDROmorphone (DILAUDID) injection 0.5 mg (0.5 mg Intravenous Given 04/10/17 1551)  HYDROmorphone (DILAUDID) injection 0.5 mg (0.5 mg Intravenous Given 04/10/17 1656)     Initial Impression / Assessment and Plan / ED Course  I have reviewed the triage vital signs and the nursing notes.  Pertinent labs & imaging results that were available during my care of the patient were reviewed by me and considered in my medical decision making (see chart for details).     Patient presents to the ED with known kidney stones as well as prior lithotripsy with complaints of left flank pain and groin pain.  Patient states this feels similar to his prior kidney stone.  Patient states he would like to avoid CT scan as he knows he has kidney stones.  Patient is on chronic pain medication including 40 mg of hydrocodone daily for back pain.  She denies any associated urinary symptoms, nausea, vomiting, fevers, chills.  Overall  well-appearing and nontoxic.  Vital signs are reassuring.  Patient is afebrile, no tachycardia or hypotension noted.  Patient has some CVA tenderness in the left side.  No signs of peritonitis.  No testicular swelling noted.  No penile discharge noted.   Patient's lab work reassuring.  No leukocytosis.  Kidney function is normal.  Elect lites are reassuring.  His UA does have significant amount of hemoglobin RBCs but no signs of infection.   I did have a shared decision-making with patient concerning ultrasound or CT scan.  Given that patient had multiple CT scans he would like ultrasound at this time.  KUB shows faint calcification at L2 that would likely represent a proximal ureteral stone.  Ultrasound shows some mild hydronephrosis in the left side without any stranding.  No definite stone noted on ultrasound.  Ultrasound of the testicle reveals normal flow no signs of torsion.  The ED with multiple rounds of Dilaudid.  States that he can go home and manage his pain with his home medications.  Does report having Zofran and Flomax at home.  States he will call his urologist tomorrow to schedule an appointment.  Patient has no intractable vomiting and is tolerating p.o. fluids appropriately.  Have low suspicion for any other acute intra-abdominal pathology as patient symptoms seem classic of his kidney stone pain including diverticulitis, appendicitis, obstruction, uit, or pyelonephritis.  Pt is hemodynamically stable, in NAD, & able to ambulate in the ED. Evaluation does not show pathology that would require ongoing emergent intervention or inpatient treatment. I explained the diagnosis to the patient. Pain has been managed & has no complaints prior to dc. Pt is comfortable with above plan and is stable for discharge at this time. All questions were answered prior to disposition. Strict return precautions for f/u to the ED were discussed. Encouraged follow up with PCP.   Final Clinical  Impressions(s) / ED Diagnoses   Final diagnoses:  Flank pain  Testicular pain    ED Discharge Orders    None       Aaron Edelman 04/11/17 [place], MD 04/12/17 [phone]

## 2017-04-13 ENCOUNTER — Other Ambulatory Visit: Payer: Self-pay | Admitting: Family Medicine

## 2017-04-13 NOTE — Telephone Encounter (Signed)
Electronic refill request. Zolpidem Last office visit:   03/23/17 Last Filled:     90 tablet 0 01/15/2017  Please advise.

## 2017-04-14 NOTE — Telephone Encounter (Signed)
Sent. Thanks.   

## 2017-04-16 ENCOUNTER — Encounter (HOSPITAL_BASED_OUTPATIENT_CLINIC_OR_DEPARTMENT_OTHER): Payer: Self-pay | Admitting: Emergency Medicine

## 2017-04-16 ENCOUNTER — Emergency Department (HOSPITAL_BASED_OUTPATIENT_CLINIC_OR_DEPARTMENT_OTHER)
Admission: EM | Admit: 2017-04-16 | Discharge: 2017-04-16 | Disposition: A | Discharge status: Home / Self Care | Payer: Self-pay | Attending: Physician Assistant | Admitting: Physician Assistant

## 2017-04-16 ENCOUNTER — Emergency Department (HOSPITAL_BASED_OUTPATIENT_CLINIC_OR_DEPARTMENT_OTHER): Payer: Self-pay

## 2017-04-16 ENCOUNTER — Other Ambulatory Visit: Payer: Self-pay

## 2017-04-16 DIAGNOSIS — Z79899 Other long term (current) drug therapy: Secondary | ICD-10-CM | POA: Insufficient documentation

## 2017-04-16 DIAGNOSIS — N200 Calculus of kidney: Secondary | ICD-10-CM | POA: Insufficient documentation

## 2017-04-16 LAB — COMPREHENSIVE METABOLIC PANEL
ALT: 71 U/L — ABNORMAL HIGH (ref 17–63)
AST: 47 U/L — ABNORMAL HIGH (ref 15–41)
Albumin: 4.7 g/dL (ref 3.5–5.0)
Alkaline Phosphatase: 81 U/L (ref 38–126)
Anion gap: 12 (ref 5–15)
BUN: 12 mg/dL (ref 6–20)
CO2: 24 mmol/L (ref 22–32)
Calcium: 9.6 mg/dL (ref 8.9–10.3)
Chloride: 103 mmol/L (ref 101–111)
Creatinine, Ser: 0.95 mg/dL (ref 0.61–1.24)
GFR calc Af Amer: >60 mL/min (ref >60)
GFR calc non Af Amer: >60 mL/min (ref >60)
Glucose, Bld: 111 mg/dL — ABNORMAL HIGH (ref 65–99)
Potassium: 3.8 mmol/L (ref 3.5–5.1)
Sodium: 139 mmol/L (ref 135–145)
Total Bilirubin: 0.9 mg/dL (ref 0.3–1.2)
Total Protein: 7.9 g/dL (ref 6.5–8.1)

## 2017-04-16 LAB — CBC WITH DIFFERENTIAL/PLATELET
Basophils Absolute: 0.1 10*3/uL (ref 0.0–0.1)
Basophils Relative: 1 %
Eosinophils Absolute: 0.3 10*3/uL (ref 0.0–0.7)
Eosinophils Relative: 3 %
HCT: 49.6 % (ref 39.0–52.0)
Hemoglobin: 17.8 g/dL — ABNORMAL HIGH (ref 13.0–17.0)
Lymphocytes Relative: 34 %
Lymphs Abs: 3.5 10*3/uL (ref 0.7–4.0)
MCH: 30.8 pg (ref 26.0–34.0)
MCHC: 35.9 g/dL (ref 30.0–36.0)
MCV: 85.8 fL (ref 78.0–100.0)
Monocytes Absolute: 1.2 10*3/uL — ABNORMAL HIGH (ref 0.1–1.0)
Monocytes Relative: 12 %
Neutro Abs: 5.3 10*3/uL (ref 1.7–7.7)
Neutrophils Relative %: 50 %
Platelets: 349 10*3/uL (ref 150–400)
RBC: 5.78 MIL/uL (ref 4.22–5.81)
RDW: 14.2 % (ref 11.5–15.5)
WBC: 10.3 10*3/uL (ref 4.0–10.5)

## 2017-04-16 LAB — URINALYSIS, ROUTINE W REFLEX MICROSCOPIC
Bilirubin Urine: NEGATIVE
Glucose, UA: NEGATIVE mg/dL
Ketones, ur: NEGATIVE mg/dL
Leukocytes, UA: NEGATIVE
Nitrite: NEGATIVE
Protein, ur: NEGATIVE mg/dL
Specific Gravity, Urine: >1.03 — ABNORMAL HIGH (ref 1.005–1.030)
pH: 5.5 (ref 5.0–8.0)

## 2017-04-16 LAB — URINALYSIS, MICROSCOPIC (REFLEX)

## 2017-04-16 MED ORDER — SODIUM CHLORIDE 0.9 % IV BOLUS (SEPSIS)
1000.0000 mL | Freq: Once | INTRAVENOUS | Status: AC
  Administered 2017-04-16: 1000 mL via INTRAVENOUS

## 2017-04-16 MED ORDER — HYDROMORPHONE HCL 1 MG/ML IJ SOLN
1.0000 mg | Freq: Once | INTRAMUSCULAR | Status: AC
  Administered 2017-04-16: 1 mg via INTRAVENOUS
  Filled 2017-04-16: qty 1

## 2017-04-16 MED ORDER — HYDROMORPHONE HCL 1 MG/ML IJ SOLN
1.0000 mg | Freq: Once | INTRAMUSCULAR | Status: AC
  Administered 2017-04-16: 1 mg via INTRAVENOUS
  Filled 2017-04-16 (×2): qty 1

## 2017-04-16 MED ORDER — HYDROMORPHONE HCL 1 MG/ML IJ SOLN
0.5000 mg | Freq: Once | INTRAMUSCULAR | Status: AC
  Administered 2017-04-16: 0.5 mg via INTRAVENOUS
  Filled 2017-04-16: qty 1

## 2017-04-16 MED ORDER — ONDANSETRON HCL 4 MG/2ML IJ SOLN
4.0000 mg | Freq: Once | INTRAMUSCULAR | Status: AC
  Administered 2017-04-16: 4 mg via INTRAVENOUS
  Filled 2017-04-16: qty 2

## 2017-04-16 NOTE — ED Notes (Signed)
Assumed care of patient from [place], [place]. Pt resting quietly. No distress. No complaints. Awaiting EDP disposition. UA given.

## 2017-04-16 NOTE — ED Provider Notes (Signed)
[facility] EMERGENCY DEPARTMENT Provider Note   CSN: [id number] Arrival date & time: 04/16/17  0522     History   Chief Complaint Chief Complaint  Patient presents with  . Flank Pain    HPI Casey Caldwell is a 37 y.o. male.  HPI   Patient is a 37 year old male with past medical history significant for multiple kidney stones, left orchiectomy, history of chronic pain syndrome, and chronic pain management presenting today with kidney stone pain.  Patient reports that it is very similar to times has had in the past.  Patient reports he was seen here on the 18th, 6 days ago and had an x-ray at that time showing a questionable small ureteral stone.  Patient reports his symptoms have been about the same since then.  Got worse tonight.  Past Medical History:  Diagnosis Date  . Depression    and panic/anxiety  . GERD (gastroesophageal reflux disease)   . History of kidney stones   . Insomnia   . Kidney stones   . Seizures ([place]) Childhood    Patient Active Problem List   Diagnosis Date Noted  . Encounter for chronic pain management 01/28/2016  . Chronic pain syndrome 10/30/2015  . Adenomatous colon polyp 06/06/2012  . Nephrolithiasis 03/14/2012  . Polycythemia 03/14/2012  . Groin pain 03/14/2012  . RUQ pain 03/14/2012  . Insomnia 03/14/2012  . Seizure disorder ([place]) 03/14/2012  . Anxiety and depression 03/14/2012    Past Surgical History:  Procedure Laterality Date  . APPENDECTOMY  2011  . CHOLECYSTECTOMY, LAPAROSCOPIC    . HYDROCELE EXCISION Left 06/2012  . LAPAROSCOPIC CHOLECYSTECTOMY  1996  . LITHOTRIPSY     four times over the years; most recent 10/08/12  . ORCHIECTOMY Left 11/2012  . SPINAL CORD STIMULATOR INSERTION  2015  . TONSILLECTOMY  1992  . WRIST GANGLION EXCISION  2002   Right        Home Medications    Prior to Admission medications   Medication Sig Start Date End Date Taking? Authorizing Provider  ARIPiprazole (ABILIFY) 5 MG tablet  Take 0.5 tablets (2.5 mg total) by mouth daily. 03/23/17   Tonia Ghent, MD  docusate sodium (COLACE) 100 MG capsule Take 1 capsule (100 mg total) by mouth 2 (two) times daily. 01/27/16   Tonia Ghent, MD  escitalopram (LEXAPRO) 20 MG tablet TAKE ONE TABLET BY MOUTH DAILY 12/30/16   Tonia Ghent, MD  HYDROcodone-acetaminophen [facility]) 10-325 MG tablet Take 1 tablet by mouth every 6 (six) hours as needed. 03/23/17   Tonia Ghent, MD  HYDROmorphone (DILAUDID) 2 MG tablet Take 1 tablet (2 mg total) by mouth every 4 (four) hours as needed for severe pain (for kidney stones). 03/23/17   Tonia Ghent, MD  ibuprofen (ADVIL) 200 MG tablet Take 3 tablets (600 mg total) by mouth 2 (two) times daily as needed. 10/29/15   Tonia Ghent, MD  ondansetron (ZOFRAN ODT) 4 MG disintegrating tablet Take 1 tablet (4 mg total) by mouth every 8 (eight) hours as needed for nausea or vomiting. 02/03/17   Tonia Ghent, MD  pantoprazole (PROTONIX) 40 MG tablet TAKE ONE TABLET BY MOUTH TWICE A DAY 01/27/17   Tonia Ghent, MD  polyethylene glycol powder (GLYCOLAX/MIRALAX) powder MIX 17G IN 4 TO 8 OUNCES OF FLUID AND TAKE TWICE DAILY 02/17/15   Tonia Ghent, MD  tamsulosin (FLOMAX) 0.4 MG CAPS capsule Take 1 capsule (0.4 mg total) by mouth  daily. 02/28/17   Tegeler, Gwenyth Allegra, MD  zolpidem (AMBIEN) 10 MG tablet TAKE 1 TABLET BY MOUTH AT BEDTIME AS NEEDED 04/14/17   Tonia Ghent, MD    Family History Family History  Problem Relation Age of Onset  . Stroke Mother   . Liver disease Mother   . Irritable bowel syndrome Mother   . Kidney disease Mother   . Heart disease Father   . Colon polyps Father   . Colon cancer Neg Hx     Social History Social History   Tobacco Use  . Smoking status: Never Smoker  . Smokeless tobacco: Never Used  Substance Use Topics  . Alcohol use: Yes    Alcohol/week: 0.0 oz    Comment: rarely  . Drug use: No     Allergies   Phenergan [promethazine hcl];  Tape; Toradol [ketorolac tromethamine]; Trileptal [oxcarbazepine]; and Wellbutrin [bupropion]   Review of Systems Review of Systems  Constitutional: Negative for activity change.  Respiratory: Negative for shortness of breath.   Cardiovascular: Negative for chest pain.  Gastrointestinal: Positive for abdominal pain and vomiting.  Genitourinary: Positive for testicular pain.  All other systems reviewed and are negative.    Physical Exam Updated Vital Signs BP (!) 141/101 (BP Location: Right Arm)   Pulse (!) 103   Temp 97.8 F (36.6 C) (Oral)   Resp 20   Ht 6' (1.829 m)   Wt (!) 153.8 kg (339 lb)   SpO2 94%   BMI 45.98 kg/m   Physical Exam  Constitutional: He is oriented to person, place, and time. He appears well-nourished.  HENT:  Head: Normocephalic.  Eyes: Conjunctivae are normal.  Cardiovascular: Normal rate and regular rhythm.  Pulmonary/Chest: Effort normal and breath sounds normal.  Abdominal: Soft. He exhibits no distension. There is tenderness.  Suprapubic pain  Genitourinary:  Genitourinary Comments: R testicle firm, no ertyehma. Done with chaperone  Neurological: He is oriented to person, place, and time.  Skin: Skin is warm and dry. He is not diaphoretic.  Psychiatric: He has a normal mood and affect. His behavior is normal.     ED Treatments / Results  Labs (all labs ordered are listed, but only abnormal results are displayed) Labs Reviewed  CBC WITH DIFFERENTIAL/PLATELET - Abnormal; Notable for the following components:      Result Value   Hemoglobin 17.8 (*)    Monocytes Absolute 1.2 (*)    All other components within normal limits  COMPREHENSIVE METABOLIC PANEL - Abnormal; Notable for the following components:   Glucose, Bld 111 (*)    AST 47 (*)    ALT 71 (*)    All other components within normal limits  URINE CULTURE  URINALYSIS, ROUTINE W REFLEX MICROSCOPIC    EKG None  Radiology Ct Renal Stone Study  Result Date:  04/16/2017 CLINICAL DATA:  Left-sided flank pain and groin pain. EXAM: CT ABDOMEN AND PELVIS WITHOUT CONTRAST TECHNIQUE: Multidetector CT imaging of the abdomen and pelvis was performed following the standard protocol without IV contrast. COMPARISON:  CT abdomen pelvis 02/17/2017 FINDINGS: Lower chest: No basilar pulmonary nodules or pleural effusion. No apical pericardial effusion. Hepatobiliary: Diffuse hypoattenuation of the liver relative to the spleen suggests hepatic steatosis. No focal liver lesion or biliary dilatation. Status post cholecystectomy. Pancreas: Normal parenchymal contours without ductal dilatation. No peripancreatic fluid collection. Spleen: Normal. Adrenals/Urinary Tract: --Adrenal glands: Normal. --Right kidney/ureter: 5 mm nonobstructive lower pole stone. --Left kidney/ureter: There is a stone in the proximal left ureter  measuring 5 mm. This causes moderate hydronephrosis. The proximal ureter is also moderately dilated. --Urinary bladder: Normal appearance for the degree of distention. Stomach/Bowel: --Stomach/Duodenum: No hiatal hernia or other gastric abnormality. Normal duodenal course. --Small bowel: No dilatation or inflammation. --Colon: No focal abnormality. --Appendix: Not visualized. No right lower quadrant inflammation or free fluid. Vascular/Lymphatic: Normal course and caliber of the major abdominal vessels. No abdominal or pelvic lymphadenopathy. Reproductive: Normal prostate and seminal vesicles. Musculoskeletal. No bony spinal canal stenosis or focal osseous abnormality. Other: Spinal stimulator with epidural leads terminating in the midthoracic spinal canal. IMPRESSION: 1. Left-sided obstructive uropathy with 5 mm proximal ureteral stone causing moderate hydroureteronephrosis. 2. Nonobstructive 5 mm right lower pole calculus. Electronically Signed   By: Ulyses Jarred M.D.   On: 04/16/2017 06:29    Procedures Procedures (including critical care time)  Medications Ordered  in ED Medications  HYDROmorphone (DILAUDID) injection 0.5 mg (0.5 mg Intravenous Given 04/16/17 0555)  ondansetron (ZOFRAN) injection 4 mg (4 mg Intravenous Given 04/16/17 0554)  sodium chloride 0.9 % bolus 1,000 mL (1,000 mLs Intravenous New Bag/Given 04/16/17 0558)  HYDROmorphone (DILAUDID) injection 1 mg (1 mg Intravenous Given 04/16/17 0092)     Initial Impression / Assessment and Plan / ED Course  I have reviewed the triage vital signs and the nursing notes.  Pertinent labs & imaging results that were available during my care of the patient were reviewed by me and considered in my medical decision making (see chart for details).     Patient is a 37 year old male with past medical history significant for multiple kidney stones, left orchiectomy, history of chronic pain syndrome, and chronic pain management presenting today with kidney stone pain.  Patient reports that it is very similar to times has had in the past.  Patient reports he was seen here on the 18th, 6 days ago and had an x-ray at that time showing a questionable small ureteral stone.  Patient reports his symptoms have been about the same since then.  Got worse tonight.   5:51 AM Patient has history of pain medication seeking behavior.  However also has history of kidney stones requiring lithotripsy.  Will give initial dose of pain medication, get CT to further evaluate.  Patient has pain in the right testicle and groin however he had this last time as well and already received an ultrasound for this.  He did not denies change in pain.  He thinks that the pain is related to kidney stones.   6:36 AM Patient was completely improved.  CT shows 5 mm obstructive stone.  Patient has follow-up with urology at [facility].  Patient has pain medication Flomax and nausea medicine at home.  He does not want any pain medication perscribed because he is on a pain med contract.  We agreed that we will offer her a third dose of pain medication  and then will discharge patient home with follow-up.  Final Clinical Impressions(s) / ED Diagnoses   Final diagnoses:  None    ED Discharge Orders    None       Macarthur Critchley, MD 04/16/17 [phone]

## 2017-04-16 NOTE — ED Provider Notes (Signed)
Signed out by Dr Thomasene Lot that pt may be d/cd to home after meds if pain controlled.  Recheck pt - pt reports pain much improved. Afebrile. abd soft nt.      Lajean Saver, MD 04/16/17 [phone]

## 2017-04-16 NOTE — Discharge Instructions (Addendum)
You have a 5 mm stone with hydro-nephrosis on the left-hand side.  Please follow-up with your urologist.  Please return with any fever, inability to stay hydrated or other concerns.  You were given pain medication in the ER - no driving for the next 6 hours.

## 2017-04-16 NOTE — ED Triage Notes (Addendum)
L sided flank & groin pain that started 30 min PTA. States he was seen 04/10/17 for same but pain had been improved. Hx of kidney stones. Pt pacing, moaning.

## 2017-04-16 NOTE — ED Notes (Signed)
Pt asked for urine sample, states he tried and is unable to provide one at this time. States he will try again in a few minutes. Majority of 1L NS bag has infused.

## 2017-04-17 LAB — URINE CULTURE: Culture: NO GROWTH

## 2017-04-24 ENCOUNTER — Encounter: Payer: Self-pay | Admitting: Family Medicine

## 2017-04-26 ENCOUNTER — Other Ambulatory Visit: Payer: Self-pay | Admitting: Family Medicine

## 2017-04-26 NOTE — Telephone Encounter (Signed)
Copied from [place]. Topic: Quick Communication - Rx Refill/Question >> Apr 26, 2017  5:36 PM Selinda Flavin B, NT wrote: Medication: HYDROcodone-acetaminophen (NORCO) 10-325 MG tablet, HYDROmorphone (DILAUDID) 2 MG tablet Has the patient contacted their pharmacy? Yes.   (Agent: If no, request that the patient contact the pharmacy for the refill.) Preferred Pharmacy (with phone number or street name): [facility] at [place], [place] - [place] [phone] (Phone) [phone] (Fax)   Agent: Please be advised that RX refills may take up to 3 business days. We ask that you follow-up with your pharmacy.

## 2017-04-26 NOTE — Telephone Encounter (Signed)
LOV: 03/23/17  Dr. Aurelio Jew at [facility]

## 2017-04-27 ENCOUNTER — Other Ambulatory Visit: Payer: Self-pay | Admitting: Family Medicine

## 2017-04-27 MED ORDER — HYDROMORPHONE HCL 2 MG PO TABS: 2.0000 mg | ORAL_TABLET | ORAL | 0 refills | Status: DC | PRN

## 2017-04-27 MED ORDER — HYDROCODONE-ACETAMINOPHEN 10-325 MG PO TABS: 1.0000 | ORAL_TABLET | Freq: Four times a day (QID) | ORAL | 0 refills | Status: DC | PRN

## 2017-04-27 NOTE — Telephone Encounter (Signed)
rx sent, mychart message sent to patient.  Thanks.

## 2017-04-27 NOTE — Telephone Encounter (Signed)
  Pt requesting refill Hydrocodone apap Last refilled and qty; # 120 on 03/23/17 Last seen:03/23/17 [facility]acy:[facility]:03/04/16  Requesting refill dilaudid Last refilled and qty: # 30 on 03/23/17 Last seen; 03/23/17 [facility]acy[facility]: 03/04/16  Please seen pt email on 04/24/17.

## 2017-04-30 ENCOUNTER — Encounter (HOSPITAL_BASED_OUTPATIENT_CLINIC_OR_DEPARTMENT_OTHER): Payer: Self-pay | Admitting: *Deleted

## 2017-04-30 ENCOUNTER — Other Ambulatory Visit: Payer: Self-pay

## 2017-04-30 ENCOUNTER — Emergency Department (HOSPITAL_BASED_OUTPATIENT_CLINIC_OR_DEPARTMENT_OTHER)
Admission: EM | Admit: 2017-04-30 | Discharge: 2017-04-30 | Disposition: A | Discharge status: Home / Self Care | Payer: Self-pay | Attending: Emergency Medicine | Admitting: Emergency Medicine

## 2017-04-30 DIAGNOSIS — M545 Low back pain: Secondary | ICD-10-CM | POA: Insufficient documentation

## 2017-04-30 DIAGNOSIS — Z5321 Procedure and treatment not carried out due to patient leaving prior to being seen by health care provider: Secondary | ICD-10-CM | POA: Insufficient documentation

## 2017-04-30 DIAGNOSIS — R1084 Generalized abdominal pain: Secondary | ICD-10-CM | POA: Insufficient documentation

## 2017-04-30 LAB — URINALYSIS, ROUTINE W REFLEX MICROSCOPIC
Bilirubin Urine: NEGATIVE
Glucose, UA: NEGATIVE mg/dL
Ketones, ur: NEGATIVE mg/dL
Leukocytes, UA: NEGATIVE
Nitrite: NEGATIVE
Protein, ur: NEGATIVE mg/dL
Specific Gravity, Urine: >1.03 — ABNORMAL HIGH (ref 1.005–1.030)
pH: 6 (ref 5.0–8.0)

## 2017-04-30 LAB — URINALYSIS, MICROSCOPIC (REFLEX)

## 2017-04-30 NOTE — ED Notes (Signed)
PT did not answer when called

## 2017-04-30 NOTE — ED Notes (Signed)
Called 2 additional times, no answer

## 2017-04-30 NOTE — ED Notes (Signed)
Called for room, not found in lobby

## 2017-04-30 NOTE — ED Triage Notes (Signed)
Pt reports low back pain and groin pain x 1 week. States pain worsened today

## 2017-05-01 ENCOUNTER — Emergency Department (HOSPITAL_BASED_OUTPATIENT_CLINIC_OR_DEPARTMENT_OTHER): Payer: Self-pay

## 2017-05-01 ENCOUNTER — Other Ambulatory Visit: Payer: Self-pay

## 2017-05-01 ENCOUNTER — Encounter (HOSPITAL_BASED_OUTPATIENT_CLINIC_OR_DEPARTMENT_OTHER): Payer: Self-pay | Admitting: Emergency Medicine

## 2017-05-01 ENCOUNTER — Emergency Department (HOSPITAL_BASED_OUTPATIENT_CLINIC_OR_DEPARTMENT_OTHER)
Admission: EM | Admit: 2017-05-01 | Discharge: 2017-05-01 | Disposition: A | Discharge status: Home / Self Care | Payer: Self-pay | Attending: Emergency Medicine | Admitting: Emergency Medicine

## 2017-05-01 DIAGNOSIS — R109 Unspecified abdominal pain: Secondary | ICD-10-CM | POA: Insufficient documentation

## 2017-05-01 DIAGNOSIS — Z79899 Other long term (current) drug therapy: Secondary | ICD-10-CM | POA: Insufficient documentation

## 2017-05-01 LAB — CBC WITH DIFFERENTIAL/PLATELET
Basophils Absolute: 0 10*3/uL (ref 0.0–0.1)
Basophils Relative: 1 %
Eosinophils Absolute: 0.3 10*3/uL (ref 0.0–0.7)
Eosinophils Relative: 3 %
HCT: 46.6 % (ref 39.0–52.0)
Hemoglobin: 16.8 g/dL (ref 13.0–17.0)
Lymphocytes Relative: 30 %
Lymphs Abs: 2.4 10*3/uL (ref 0.7–4.0)
MCH: 31.3 pg (ref 26.0–34.0)
MCHC: 36.1 g/dL — ABNORMAL HIGH (ref 30.0–36.0)
MCV: 86.8 fL (ref 78.0–100.0)
Monocytes Absolute: 1 10*3/uL (ref 0.1–1.0)
Monocytes Relative: 12 %
Neutro Abs: 4.6 10*3/uL (ref 1.7–7.7)
Neutrophils Relative %: 54 %
Platelets: 312 10*3/uL (ref 150–400)
RBC: 5.37 MIL/uL (ref 4.22–5.81)
RDW: 13.5 % (ref 11.5–15.5)
WBC: 8.2 10*3/uL (ref 4.0–10.5)

## 2017-05-01 LAB — URINALYSIS, ROUTINE W REFLEX MICROSCOPIC
Bilirubin Urine: NEGATIVE
Glucose, UA: NEGATIVE mg/dL
Ketones, ur: NEGATIVE mg/dL
Leukocytes, UA: NEGATIVE
Nitrite: NEGATIVE
Protein, ur: NEGATIVE mg/dL
Specific Gravity, Urine: 1.02 (ref 1.005–1.030)
pH: 6 (ref 5.0–8.0)

## 2017-05-01 LAB — BASIC METABOLIC PANEL
Anion gap: 8 (ref 5–15)
BUN: 12 mg/dL (ref 6–20)
CO2: 27 mmol/L (ref 22–32)
Calcium: 9.1 mg/dL (ref 8.9–10.3)
Chloride: 106 mmol/L (ref 101–111)
Creatinine, Ser: 0.96 mg/dL (ref 0.61–1.24)
GFR calc Af Amer: >60 mL/min (ref >60)
GFR calc non Af Amer: >60 mL/min (ref >60)
Glucose, Bld: 98 mg/dL (ref 65–99)
Potassium: 4.2 mmol/L (ref 3.5–5.1)
Sodium: 141 mmol/L (ref 135–145)

## 2017-05-01 LAB — URINALYSIS, MICROSCOPIC (REFLEX)

## 2017-05-01 MED ORDER — HYDROMORPHONE HCL 1 MG/ML IJ SOLN
1.0000 mg | Freq: Once | INTRAMUSCULAR | Status: AC
  Administered 2017-05-01: 1 mg via INTRAVENOUS
  Filled 2017-05-01: qty 1

## 2017-05-01 MED ORDER — TAMSULOSIN HCL 0.4 MG PO CAPS: 0.4000 mg | ORAL_CAPSULE | Freq: Every day | ORAL | 2 refills | Status: DC

## 2017-05-01 MED ORDER — HYDROMORPHONE HCL 1 MG/ML IJ SOLN
2.0000 mg | Freq: Once | INTRAMUSCULAR | Status: AC
  Administered 2017-05-01: 2 mg via INTRAVENOUS
  Filled 2017-05-01: qty 2

## 2017-05-01 MED ORDER — ONDANSETRON HCL 4 MG/2ML IJ SOLN
4.0000 mg | Freq: Once | INTRAMUSCULAR | Status: AC
  Administered 2017-05-01: 4 mg via INTRAVENOUS
  Filled 2017-05-01 (×2): qty 2

## 2017-05-01 NOTE — ED Notes (Signed)
Pt. Asking for more pain medicine.

## 2017-05-01 NOTE — ED Notes (Addendum)
[place] staff came to get pt but pt did not want to go until he got his pain med and some Zofran.  MD will have to be found for an order for Zofran.  [place] staff left and will come back when pt is ready.

## 2017-05-01 NOTE — ED Notes (Signed)
Pt. Wife is at bedside of Pt.

## 2017-05-01 NOTE — ED Notes (Signed)
Patient transported to Ultrasound 

## 2017-05-01 NOTE — ED Notes (Signed)
ED Provider at bedside. 

## 2017-05-01 NOTE — ED Notes (Signed)
Report received from [place]    Pt. Wife to be notified to come for pt. Pick up after 2pm by Pt. Due to pain meds given and Pt. Drove self here.

## 2017-05-01 NOTE — ED Triage Notes (Signed)
Right sided groin and RLQ pain since yesterday. 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  Same pain as always. Sts he was here last evening for over 3 hours and finally left.

## 2017-05-01 NOTE — ED Notes (Signed)
Pt was asked if he would have a ride home since he would be receiving Dilaudid.  Pt stated he would get an uber.  Pt was informed that he could not take an uber after receiving narcotics but would have to have an actual ride.  Pt stated that he had been seen here at this facility 30 times for kidney stones and it has "never been a problem except one time and that nurse checked and found out that she was wrong... That it was OK to take an uber."  Conferred with Database administrator and confirmed that pt could not take an uber home but would have to have a ride. Pulled up all pts previous records to 2017 and according to the records pt had a ride every time.  No record of uber rides. Pt sts his wife will get out of class at 2pm and can come get him.

## 2017-05-02 NOTE — ED Provider Notes (Signed)
[facility] EMERGENCY DEPARTMENT Provider Note   CSN: [id number] Arrival date & time: 05/01/17  1014     History   Chief Complaint Chief Complaint  Patient presents with  . Groin Pain    HPI Casey Caldwell is a 37 y.o. male.  HPI   37 year old male with flank pain.  Right-sided.  Patient has a past history of of multiple recurrent kidney stones.  He states that current symptoms feel similar.  Radiation into his testicles.  Nausea.  No vomiting.  No fevers or chills.  No obvious hematuria or other acute urinary complaints.  He has been taking his home medication without much improvement.  Past Medical History:  Diagnosis Date  . Depression    and panic/anxiety  . GERD (gastroesophageal reflux disease)   . History of kidney stones   . Insomnia   . Kidney stones   . Seizures ([place]) Childhood    Patient Active Problem List   Diagnosis Date Noted  . Encounter for chronic pain management 01/28/2016  . Chronic pain syndrome 10/30/2015  . Adenomatous colon polyp 06/06/2012  . Nephrolithiasis 03/14/2012  . Polycythemia 03/14/2012  . Groin pain 03/14/2012  . RUQ pain 03/14/2012  . Insomnia 03/14/2012  . Seizure disorder ([place]) 03/14/2012  . Anxiety and depression 03/14/2012    Past Surgical History:  Procedure Laterality Date  . APPENDECTOMY  2011  . CHOLECYSTECTOMY, LAPAROSCOPIC    . HYDROCELE EXCISION Left 06/2012  . LAPAROSCOPIC CHOLECYSTECTOMY  1996  . LITHOTRIPSY     four times over the years; most recent 10/08/12  . ORCHIECTOMY Left 11/2012  . SPINAL CORD STIMULATOR INSERTION  2015  . TONSILLECTOMY  1992  . WRIST GANGLION EXCISION  2002   Right        Home Medications    Prior to Admission medications   Medication Sig Start Date End Date Taking? Authorizing Provider  ARIPiprazole (ABILIFY) 5 MG tablet Take 0.5 tablets (2.5 mg total) by mouth daily. 03/23/17   Tonia Ghent, MD  docusate sodium (COLACE) 100 MG capsule Take 1 capsule (100 mg  total) by mouth 2 (two) times daily. 01/27/16   Tonia Ghent, MD  escitalopram (LEXAPRO) 20 MG tablet TAKE ONE TABLET BY MOUTH DAILY 12/30/16   Tonia Ghent, MD  HYDROcodone-acetaminophen [facility]) 10-325 MG tablet Take 1 tablet by mouth every 6 (six) hours as needed. 04/27/17   Tonia Ghent, MD  HYDROmorphone (DILAUDID) 2 MG tablet Take 1 tablet (2 mg total) by mouth every 4 (four) hours as needed for severe pain (for kidney stones). 04/27/17   Tonia Ghent, MD  ibuprofen (ADVIL) 200 MG tablet Take 3 tablets (600 mg total) by mouth 2 (two) times daily as needed. 10/29/15   Tonia Ghent, MD  ondansetron (ZOFRAN ODT) 4 MG disintegrating tablet Take 1 tablet (4 mg total) by mouth every 8 (eight) hours as needed for nausea or vomiting. 02/03/17   Tonia Ghent, MD  pantoprazole (PROTONIX) 40 MG tablet TAKE ONE TABLET BY MOUTH TWICE A DAY 01/27/17   Tonia Ghent, MD  polyethylene glycol powder (GLYCOLAX/MIRALAX) powder MIX 17G IN 4 TO 8 OUNCES OF FLUID AND TAKE TWICE DAILY 02/17/15   Tonia Ghent, MD  tamsulosin (FLOMAX) 0.4 MG CAPS capsule Take 1 capsule (0.4 mg total) by mouth daily. 02/28/17   Tegeler, Gwenyth Allegra, MD  tamsulosin (FLOMAX) 0.4 MG CAPS capsule Take 1 capsule (0.4 mg total) by mouth daily. 05/01/17  Virgel Manifold, MD  zolpidem (AMBIEN) 10 MG tablet TAKE 1 TABLET BY MOUTH AT BEDTIME AS NEEDED 04/14/17   Tonia Ghent, MD    Family History Family History  Problem Relation Age of Onset  . Stroke Mother   . Liver disease Mother   . Irritable bowel syndrome Mother   . Kidney disease Mother   . Heart disease Father   . Colon polyps Father   . Colon cancer Neg Hx     Social History Social History   Tobacco Use  . Smoking status: Never Smoker  . Smokeless tobacco: Never Used  Substance Use Topics  . Alcohol use: Not Currently    Alcohol/week: 0.0 oz    Comment: rarely  . Drug use: No     Allergies   Phenergan [promethazine hcl]; Tape; Toradol [ketorolac  tromethamine]; Trileptal [oxcarbazepine]; and Wellbutrin [bupropion]   Review of Systems Review of Systems  All systems reviewed and negative, other than as noted in HPI.  Physical Exam Updated Vital Signs BP 125/85 (BP Location: Left Arm)   Pulse 77   Temp 98.5 F (36.9 C) (Oral)   Resp 16   Ht 6' (1.829 m)   Wt (!) 142.9 kg (315 lb)   SpO2 98%   BMI 42.72 kg/m   Physical Exam  Constitutional: He appears well-developed and well-nourished.  Patient was laying over the bed when I walked in the room.  Appeared uncomfortable.  HENT:  Head: Normocephalic and atraumatic.  Eyes: Conjunctivae are normal. Right eye exhibits no discharge. Left eye exhibits no discharge.  Neck: Neck supple.  Cardiovascular: Normal rate, regular rhythm and normal heart sounds. Exam reveals no gallop and no friction rub.  No murmur heard. Pulmonary/Chest: Effort normal and breath sounds normal. No respiratory distress.  Abdominal: Soft. He exhibits no distension. There is no tenderness.  Obese abdomen.  Scrotum normal in appearance.  Only one testicle palpated.  Nontender.  Normal appearing Y.  No hernia.  Musculoskeletal: He exhibits no edema or tenderness.  Neurological: He is alert.  Skin: Skin is warm and dry.  Psychiatric: He has a normal mood and affect. His behavior is normal. Thought content normal.  Nursing note and vitals reviewed.    ED Treatments / Results  Labs (all labs ordered are listed, but only abnormal results are displayed) Labs Reviewed  URINALYSIS, ROUTINE W REFLEX MICROSCOPIC - Abnormal; Notable for the following components:      Result Value   Hgb urine dipstick MODERATE (*)    All other components within normal limits  URINALYSIS, MICROSCOPIC (REFLEX) - Abnormal; Notable for the following components:   Bacteria, UA RARE (*)    Squamous Epithelial / LPF 0-5 (*)    All other components within normal limits  CBC WITH DIFFERENTIAL/PLATELET - Abnormal; Notable for the  following components:   MCHC 36.1 (*)    All other components within normal limits  BASIC METABOLIC PANEL    EKG None  Radiology US Renal  Result Date: 05/01/2017 CLINICAL DATA:  RIGHT flank pain and RIGHT groin pain since yesterday morning. History of kidney stones, lithotripsy, LEFT orchectomy. EXAM: RENAL / URINARY TRACT ULTRASOUND COMPLETE COMPARISON:  CT abdomen and pelvis April 16, 2017 and renal ultrasound April 10, 2017 FINDINGS: Right Kidney: Length: 13.5 cm. Echogenicity within normal limits. No definite mass. Echogenic 8 mm calculi with acoustic shadowing lower pole, present on prior CT. Left Kidney: Length: 14.3 cm.  Normal echogenicity.  Moderate hydronephrosis. Bladder: Appears normal for  degree of bladder distention. Bilateral ureteral jets present. IMPRESSION: 1. Moderate persistent versus recurrent LEFT hydronephrosis. 2. 8 mm nonobstructing RIGHT lower pole nephrolithiasis. Electronically Signed   By: Elon Alas M.D.   On: 05/01/2017 13:37    Procedures Procedures (including critical care time)  Medications Ordered in ED Medications  HYDROmorphone (DILAUDID) injection 1 mg (1 mg Intravenous Given 05/01/17 1217)  ondansetron (ZOFRAN) injection 4 mg (4 mg Intravenous Given 05/01/17 1212)  HYDROmorphone (DILAUDID) injection 2 mg (2 mg Intravenous Given 05/01/17 1331)  HYDROmorphone (DILAUDID) injection 1 mg (1 mg Intravenous Given 05/01/17 1659)     Initial Impression / Assessment and Plan / ED Course  I have reviewed the triage vital signs and the nursing notes.  Pertinent labs & imaging results that were available during my care of the patient were reviewed by me and considered in my medical decision making (see chart for details).     37 year old male with flank pain.  Ultrasound as above.  He has a history of frequent recurrent stones.  He is afebrile.  He is urinating.  She did symptomatically in the y urgency room some improvement.  Requesting prescription for  Flomax which was provided.  He has a pain contract.  Outpatient urology follow-up.  Return precautions were discussed. Final Clinical Impressions(s) / ED Diagnoses   Final diagnoses:  Flank pain    ED Discharge Orders        Ordered    tamsulosin (FLOMAX) 0.4 MG CAPS capsule  Daily     05/01/17 1650       Virgel Manifold, MD 05/02/17 1008

## 2017-05-10 ENCOUNTER — Other Ambulatory Visit: Payer: Self-pay | Admitting: Family Medicine

## 2017-05-11 NOTE — Telephone Encounter (Signed)
Electronic refill request.  Last office visit:   03/23/17 Last Filled:    90 tablet 1 12/30/2016  Last Filled:    45 tablet 0 03/23/2017  Please advise.

## 2017-05-14 NOTE — Telephone Encounter (Signed)
Both sent.  Needs f/u in May.  Thanks.

## 2017-05-15 NOTE — Telephone Encounter (Signed)
Spoke with patient and he needs to look at his calendar before scheduling, patient advised appt for follow up is needed before more refills. Patient understood-Alpa Salvo Estell Harpin, RMA

## 2017-05-15 NOTE — Telephone Encounter (Signed)
Please schedule appointment as instructed. 

## 2017-05-16 ENCOUNTER — Other Ambulatory Visit: Payer: Self-pay

## 2017-05-16 ENCOUNTER — Encounter (HOSPITAL_BASED_OUTPATIENT_CLINIC_OR_DEPARTMENT_OTHER): Payer: Self-pay | Admitting: Emergency Medicine

## 2017-05-16 ENCOUNTER — Emergency Department (HOSPITAL_BASED_OUTPATIENT_CLINIC_OR_DEPARTMENT_OTHER): Payer: Self-pay

## 2017-05-16 ENCOUNTER — Emergency Department (HOSPITAL_BASED_OUTPATIENT_CLINIC_OR_DEPARTMENT_OTHER)
Admission: EM | Admit: 2017-05-16 | Discharge: 2017-05-16 | Disposition: A | Discharge status: Home / Self Care | Payer: Self-pay | Attending: Emergency Medicine | Admitting: Emergency Medicine

## 2017-05-16 DIAGNOSIS — N201 Calculus of ureter: Secondary | ICD-10-CM | POA: Insufficient documentation

## 2017-05-16 DIAGNOSIS — Z87442 Personal history of urinary calculi: Secondary | ICD-10-CM | POA: Insufficient documentation

## 2017-05-16 DIAGNOSIS — Z79899 Other long term (current) drug therapy: Secondary | ICD-10-CM | POA: Insufficient documentation

## 2017-05-16 LAB — CBC WITH DIFFERENTIAL/PLATELET
Basophils Absolute: 0 10*3/uL (ref 0.0–0.1)
Basophils Relative: 0 %
Eosinophils Absolute: 0.2 10*3/uL (ref 0.0–0.7)
Eosinophils Relative: 2 %
HCT: 47.5 % (ref 39.0–52.0)
Hemoglobin: 17.3 g/dL — ABNORMAL HIGH (ref 13.0–17.0)
Lymphocytes Relative: 28 %
Lymphs Abs: 2.1 10*3/uL (ref 0.7–4.0)
MCH: 31 pg (ref 26.0–34.0)
MCHC: 36.4 g/dL — ABNORMAL HIGH (ref 30.0–36.0)
MCV: 85.1 fL (ref 78.0–100.0)
Monocytes Absolute: 0.7 10*3/uL (ref 0.1–1.0)
Monocytes Relative: 9 %
Neutro Abs: 4.6 10*3/uL (ref 1.7–7.7)
Neutrophils Relative %: 61 %
Platelets: 313 10*3/uL (ref 150–400)
RBC: 5.58 MIL/uL (ref 4.22–5.81)
RDW: 13.5 % (ref 11.5–15.5)
WBC: 7.6 10*3/uL (ref 4.0–10.5)

## 2017-05-16 LAB — URINALYSIS, MICROSCOPIC (REFLEX)

## 2017-05-16 LAB — BASIC METABOLIC PANEL
Anion gap: 10 (ref 5–15)
BUN: 13 mg/dL (ref 6–20)
CO2: 23 mmol/L (ref 22–32)
Calcium: 9 mg/dL (ref 8.9–10.3)
Chloride: 104 mmol/L (ref 101–111)
Creatinine, Ser: 0.85 mg/dL (ref 0.61–1.24)
GFR calc Af Amer: >60 mL/min (ref >60)
GFR calc non Af Amer: >60 mL/min (ref >60)
Glucose, Bld: 99 mg/dL (ref 65–99)
Potassium: 4 mmol/L (ref 3.5–5.1)
Sodium: 137 mmol/L (ref 135–145)

## 2017-05-16 LAB — URINALYSIS, ROUTINE W REFLEX MICROSCOPIC
Bilirubin Urine: NEGATIVE
Glucose, UA: NEGATIVE mg/dL
Ketones, ur: NEGATIVE mg/dL
Leukocytes, UA: NEGATIVE
Nitrite: NEGATIVE
Protein, ur: NEGATIVE mg/dL
Specific Gravity, Urine: 1.02 (ref 1.005–1.030)
pH: 6 (ref 5.0–8.0)

## 2017-05-16 MED ORDER — HYDROMORPHONE HCL 1 MG/ML IJ SOLN
1.0000 mg | Freq: Once | INTRAMUSCULAR | Status: AC
  Administered 2017-05-16: 1 mg via INTRAVENOUS
  Filled 2017-05-16: qty 1

## 2017-05-16 MED ORDER — ONDANSETRON HCL 4 MG/2ML IJ SOLN
4.0000 mg | Freq: Once | INTRAMUSCULAR | Status: AC
  Administered 2017-05-16: 4 mg via INTRAVENOUS
  Filled 2017-05-16: qty 2

## 2017-05-16 MED ORDER — KETAMINE HCL 10 MG/ML IJ SOLN
0.3000 mg/kg | Freq: Once | INTRAMUSCULAR | Status: AC
  Administered 2017-05-16: 43 mg via INTRAVENOUS
  Filled 2017-05-16: qty 1

## 2017-05-16 NOTE — ED Notes (Signed)
ED Provider at bedside. 

## 2017-05-16 NOTE — ED Provider Notes (Signed)
[facility] EMERGENCY DEPARTMENT Provider Note   CSN: [id number] Arrival date & time: 05/16/17  7829     History   Chief Complaint Chief Complaint  Patient presents with  . Flank Pain    HPI Casey Caldwell is a 37 y.o. male.  HPI Patient presents with left flank pain.  Has had for the last few days.  Has had diarrhea nausea. history of kidney stones recurrently.  Also history of chronic pain.  No relief with his oral opiates at home.  No fevers or chills.  Sees Dr. Rosana Hoes at [facility] for urology. Past Medical History:  Diagnosis Date  . Depression    and panic/anxiety  . GERD (gastroesophageal reflux disease)   . History of kidney stones   . Insomnia   . Kidney stones   . Seizures ([place]) Childhood    Patient Active Problem List   Diagnosis Date Noted  . Encounter for chronic pain management 01/28/2016  . Chronic pain syndrome 10/30/2015  . Adenomatous colon polyp 06/06/2012  . Nephrolithiasis 03/14/2012  . Polycythemia 03/14/2012  . Groin pain 03/14/2012  . RUQ pain 03/14/2012  . Insomnia 03/14/2012  . Seizure disorder ([place]) 03/14/2012  . Anxiety and depression 03/14/2012    Past Surgical History:  Procedure Laterality Date  . APPENDECTOMY  2011  . CHOLECYSTECTOMY, LAPAROSCOPIC    . HYDROCELE EXCISION Left 06/2012  . LAPAROSCOPIC CHOLECYSTECTOMY  1996  . LITHOTRIPSY     four times over the years; most recent 10/08/12  . ORCHIECTOMY Left 11/2012  . SPINAL CORD STIMULATOR INSERTION  2015  . TONSILLECTOMY  1992  . WRIST GANGLION EXCISION  2002   Right        Home Medications    Prior to Admission medications   Medication Sig Start Date End Date Taking? Authorizing Provider  ARIPiprazole (ABILIFY) 5 MG tablet TAKE ONE-HALF TABLET BY MOUTH DAILY 05/14/17   Tonia Ghent, MD  docusate sodium (COLACE) 100 MG capsule Take 1 capsule (100 mg total) by mouth 2 (two) times daily. 01/27/16   Tonia Ghent, MD  escitalopram (LEXAPRO) 20 MG  tablet TAKE ONE TABLET BY MOUTH DAILY 05/14/17   Tonia Ghent, MD  HYDROcodone-acetaminophen [facility]) 10-325 MG tablet Take 1 tablet by mouth every 6 (six) hours as needed. 04/27/17   Tonia Ghent, MD  HYDROmorphone (DILAUDID) 2 MG tablet Take 1 tablet (2 mg total) by mouth every 4 (four) hours as needed for severe pain (for kidney stones). 04/27/17   Tonia Ghent, MD  ibuprofen (ADVIL) 200 MG tablet Take 3 tablets (600 mg total) by mouth 2 (two) times daily as needed. 10/29/15   Tonia Ghent, MD  ondansetron (ZOFRAN ODT) 4 MG disintegrating tablet Take 1 tablet (4 mg total) by mouth every 8 (eight) hours as needed for nausea or vomiting. 02/03/17   Tonia Ghent, MD  pantoprazole (PROTONIX) 40 MG tablet TAKE ONE TABLET BY MOUTH TWICE A DAY 01/27/17   Tonia Ghent, MD  polyethylene glycol powder (GLYCOLAX/MIRALAX) powder MIX 17G IN 4 TO 8 OUNCES OF FLUID AND TAKE TWICE DAILY 02/17/15   Tonia Ghent, MD  tamsulosin (FLOMAX) 0.4 MG CAPS capsule Take 1 capsule (0.4 mg total) by mouth daily. 02/28/17   Tegeler, Gwenyth Allegra, MD  tamsulosin (FLOMAX) 0.4 MG CAPS capsule Take 1 capsule (0.4 mg total) by mouth daily. 05/01/17   Virgel Manifold, MD  zolpidem (AMBIEN) 10 MG tablet TAKE 1 TABLET BY MOUTH  AT BEDTIME AS NEEDED 04/14/17   Tonia Ghent, MD    Family History Family History  Problem Relation Age of Onset  . Stroke Mother   . Liver disease Mother   . Irritable bowel syndrome Mother   . Kidney disease Mother   . Heart disease Father   . Colon polyps Father   . Colon cancer Neg Hx     Social History Social History   Tobacco Use  . Smoking status: Never Smoker  . Smokeless tobacco: Never Used  Substance Use Topics  . Alcohol use: Not Currently    Alcohol/week: 0.0 oz    Comment: rarely  . Drug use: No     Allergies   Phenergan [promethazine hcl]; Tape; Toradol [ketorolac tromethamine]; Trileptal [oxcarbazepine]; and Wellbutrin [bupropion]   Review of  Systems Review of Systems  Constitutional: Positive for appetite change. Negative for fever.  HENT: Negative for congestion.   Respiratory: Negative for shortness of breath.   Cardiovascular: Negative for chest pain.  Gastrointestinal: Positive for abdominal pain, diarrhea and nausea.  Genitourinary: Positive for flank pain.  Musculoskeletal: Positive for back pain.  Skin: Negative for rash.  Neurological: Negative for seizures.  Psychiatric/Behavioral: Negative for behavioral problems.     Physical Exam Updated Vital Signs BP 123/63   Pulse 75   Temp 97.8 F (36.6 C) (Oral)   Resp 16   Ht 6' (1.829 m)   Wt (!) 142.9 kg (315 lb)   SpO2 95%   BMI 42.72 kg/m   Physical Exam  Constitutional: He appears well-developed.  Patient is standing at the bedside with his face and chest on the bed.  HENT:  Head: Normocephalic.  Eyes: EOM are normal.  Neck: Neck supple.  Cardiovascular: Normal rate.  Pulmonary/Chest: Effort normal.  Abdominal: Soft. There is no guarding.  Genitourinary:  Genitourinary Comments: CVA tenderness on left side.  Musculoskeletal: He exhibits no edema.  Neurological: He is alert.  Skin: Skin is warm. Capillary refill takes less than 2 seconds.     ED Treatments / Results  Labs (all labs ordered are listed, but only abnormal results are displayed) Labs Reviewed  CBC WITH DIFFERENTIAL/PLATELET - Abnormal; Notable for the following components:      Result Value   Hemoglobin 17.3 (*)    MCHC 36.4 (*)    All other components within normal limits  URINALYSIS, ROUTINE W REFLEX MICROSCOPIC - Abnormal; Notable for the following components:   Hgb urine dipstick TRACE (*)    All other components within normal limits  URINALYSIS, MICROSCOPIC (REFLEX) - Abnormal; Notable for the following components:   Bacteria, UA RARE (*)    All other components within normal limits  BASIC METABOLIC PANEL    EKG None  Radiology Dg Abdomen 1 View  Result Date:  05/16/2017 CLINICAL DATA:  Acute left flank pain. EXAM: ABDOMEN - 1 VIEW COMPARISON:  CT scan of April 16, 2017. Radiographs of April 10, 2017. FINDINGS: The bowel gas pattern is normal. Status post cholecystectomy. Stable position of stimulator leads in lower thoracic and upper lumbar spine. No definite renal calcifications are noted. Faint density seen to left of L4 vertebral body which potentially may represent distal ureteral calculus. IMPRESSION: No evidence of bowel obstruction or ileus. Faint density seen to left of L4 vertebral body which potentially may represent distal left ureteral calculus. Electronically Signed   By: Marijo Conception, M.D.   On: 05/16/2017 09:41   US Renal  Result Date: 05/16/2017 CLINICAL  DATA:  Left flank pain. EXAM: RENAL / URINARY TRACT ULTRASOUND COMPLETE COMPARISON:  Ultrasound 05/01/2017. FINDINGS: Right Kidney: Length: 12.7 cm. Echogenicity within normal limits. 8 mm nonobstructing stone. No mass or hydronephrosis visualized. Left Kidney: Length: 13.6 cm. Echogenicity within normal limits. No mass. Mild hydronephrosis, improved from prior exam. Left renal pelvis diameter is 1.1 cm down from 2.2 cm on prior exam of 05/01/2017. Bladder: Appears normal for degree of bladder distention. IMPRESSION: 1.  Mild left-sided hydronephrosis.  Improved from prior exam. 2.  8 mm nonobstructing right renal stone again identified. Electronically Signed   By: Marcello Moores  Register   On: 05/16/2017 10:58    Procedures Procedures (including critical care time)  Medications Ordered in ED Medications  ketamine (KETALAR) injection 43 mg (43 mg Intravenous Given 05/16/17 0906)  ondansetron (ZOFRAN) injection 4 mg (4 mg Intravenous Given 05/16/17 0906)  HYDROmorphone (DILAUDID) injection 1 mg (1 mg Intravenous Given 05/16/17 1016)  HYDROmorphone (DILAUDID) injection 1 mg (1 mg Intravenous Given 05/16/17 1146)  ondansetron (ZOFRAN) injection 4 mg (4 mg Intravenous Given 05/16/17 1146)      Initial Impression / Assessment and Plan / ED Course  I have reviewed the triage vital signs and the nursing notes.  Pertinent labs & imaging results that were available during my care of the patient were reviewed by me and considered in my medical decision making (see chart for details).     Patient with flank pain.  History of same.  Recent ureteral stone.  Lab work reassuring.  Likely has continued stone.  Patient was given fentanyl as potential opiate sparing medicine and he did not tolerate well.  Feels better after Dilaudid.  Discharged home to follow with his urologist.  Final Clinical Impressions(s) / ED Diagnoses   Final diagnoses:  Left ureteral stone    ED Discharge Orders    None       Davonna Belling, MD 05/16/17 1544

## 2017-05-16 NOTE — ED Notes (Signed)
Ultrasound at the bedside

## 2017-05-16 NOTE — ED Triage Notes (Signed)
Reports left flank and groin pain since yesterday afternoon.  Reports dysuria, diarrhea, nausea.  Denies vomiting, hematuria.

## 2017-05-30 ENCOUNTER — Ambulatory Visit: Payer: Self-pay | Admitting: Family Medicine

## 2017-06-02 ENCOUNTER — Ambulatory Visit: Payer: Self-pay | Admitting: Family Medicine

## 2017-06-02 ENCOUNTER — Encounter: Payer: Self-pay | Admitting: Family Medicine

## 2017-06-02 ENCOUNTER — Telehealth: Payer: Self-pay | Admitting: Family Medicine

## 2017-06-02 MED ORDER — HYDROMORPHONE HCL 2 MG PO TABS: 2.0000 mg | ORAL_TABLET | ORAL | 0 refills | Status: DC | PRN

## 2017-06-02 MED ORDER — HYDROCODONE-ACETAMINOPHEN 10-325 MG PO TABS: 1.0000 | ORAL_TABLET | Freq: Four times a day (QID) | ORAL | 0 refills | Status: DC | PRN

## 2017-06-02 NOTE — Telephone Encounter (Signed)
Last refill 04/27/17 #120 Last office visit 02/21/17

## 2017-06-02 NOTE — Telephone Encounter (Signed)
Patient notified as instructed by telephone and verbalized understanding. Patient stated that his dad just passed away.  Advised patient that we will be thinking about him and his family.

## 2017-06-02 NOTE — Telephone Encounter (Signed)
Both sent.  Thanks.  Please offer my condolences.
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
## 2017-06-04 NOTE — Telephone Encounter (Signed)
Thanks

## 2017-06-09 ENCOUNTER — Ambulatory Visit (INDEPENDENT_AMBULATORY_CARE_PROVIDER_SITE_OTHER): Payer: Self-pay | Admitting: Family Medicine

## 2017-06-09 ENCOUNTER — Encounter: Payer: Self-pay | Admitting: Family Medicine

## 2017-06-09 DIAGNOSIS — G894 Chronic pain syndrome: Secondary | ICD-10-CM

## 2017-06-09 MED ORDER — TAMSULOSIN HCL 0.4 MG PO CAPS: 0.4000 mg | ORAL_CAPSULE | Freq: Every day | ORAL | 1 refills | Status: DC
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
## 2017-06-09 NOTE — Patient Instructions (Signed)
Don't change your meds for and update me as needed.  See about getting in with water aerobics.   I'll be thinking about you and your family.  Take care.  Glad to see you.

## 2017-06-11 NOTE — Assessment & Plan Note (Addendum)
Indication for chronic opioid:  Chronic back pain, renal stones. Medication and dose: Hydrocodone 10/325.  1 p.o. every 6 hours.  120/month. Hydromorphone 2 mg p.o. every 4 hours as needed for kidney stones.  30/month. # pills per month:  See above.   Last UDS date: 03/04/16 Pain contract signed (Y/N): yes Date narcotic database last reviewed (include red flags): 06/11/17   No change in meds at this point.  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  He is making some gradual progress.  He is going to try to get back in water aerobics and since that will be easier on his joints.  I gave him a work note with him anticipated return to work note, this is assuming he continues to make some progress in the meantime.  Continue opiates.  He has significant pain and some relief from medication.  No adverse effect on medication at this point.  He will try to follow-up with pain clinic when possible.  His insurance limitations and financial restrictions prevent immediate follow-up with the pain clinic now.  He still has his back stimulator and still has some relief of pain from that.  He agrees with plan.  Offered condolences about his father and he is trying to work through all of that, appropriately.  He will update me as needed.

## 2017-06-12 ENCOUNTER — Emergency Department (HOSPITAL_BASED_OUTPATIENT_CLINIC_OR_DEPARTMENT_OTHER)
Admission: EM | Admit: 2017-06-12 | Discharge: 2017-06-12 | Disposition: A | Discharge status: Home / Self Care | Payer: Self-pay | Attending: Emergency Medicine | Admitting: Emergency Medicine

## 2017-06-12 ENCOUNTER — Encounter (HOSPITAL_BASED_OUTPATIENT_CLINIC_OR_DEPARTMENT_OTHER): Payer: Self-pay | Admitting: *Deleted

## 2017-06-12 ENCOUNTER — Other Ambulatory Visit: Payer: Self-pay

## 2017-06-12 ENCOUNTER — Emergency Department (HOSPITAL_BASED_OUTPATIENT_CLINIC_OR_DEPARTMENT_OTHER): Payer: Self-pay

## 2017-06-12 DIAGNOSIS — N200 Calculus of kidney: Secondary | ICD-10-CM | POA: Insufficient documentation

## 2017-06-12 DIAGNOSIS — Z79899 Other long term (current) drug therapy: Secondary | ICD-10-CM | POA: Insufficient documentation

## 2017-06-12 LAB — CBC WITH DIFFERENTIAL/PLATELET
Basophils Absolute: 0 10*3/uL (ref 0.0–0.1)
Basophils Relative: 0 %
Eosinophils Absolute: 0.3 10*3/uL (ref 0.0–0.7)
Eosinophils Relative: 3 %
HCT: 46.6 % (ref 39.0–52.0)
Hemoglobin: 17.1 g/dL — ABNORMAL HIGH (ref 13.0–17.0)
Lymphocytes Relative: 28 %
Lymphs Abs: 2.7 10*3/uL (ref 0.7–4.0)
MCH: 31.1 pg (ref 26.0–34.0)
MCHC: 36.7 g/dL — ABNORMAL HIGH (ref 30.0–36.0)
MCV: 84.7 fL (ref 78.0–100.0)
Monocytes Absolute: 0.9 10*3/uL (ref 0.1–1.0)
Monocytes Relative: 9 %
Neutro Abs: 5.9 10*3/uL (ref 1.7–7.7)
Neutrophils Relative %: 60 %
Platelets: 313 10*3/uL (ref 150–400)
RBC: 5.5 MIL/uL (ref 4.22–5.81)
RDW: 13.5 % (ref 11.5–15.5)
WBC: 9.8 10*3/uL (ref 4.0–10.5)

## 2017-06-12 LAB — URINALYSIS, ROUTINE W REFLEX MICROSCOPIC

## 2017-06-12 LAB — COMPREHENSIVE METABOLIC PANEL
ALT: 60 U/L (ref 17–63)
AST: 44 U/L — ABNORMAL HIGH (ref 15–41)
Albumin: 4.4 g/dL (ref 3.5–5.0)
Alkaline Phosphatase: 69 U/L (ref 38–126)
Anion gap: 9 (ref 5–15)
BUN: 14 mg/dL (ref 6–20)
CO2: 25 mmol/L (ref 22–32)
Calcium: 9.3 mg/dL (ref 8.9–10.3)
Chloride: 105 mmol/L (ref 101–111)
Creatinine, Ser: 0.86 mg/dL (ref 0.61–1.24)
GFR calc Af Amer: >60 mL/min (ref >60)
GFR calc non Af Amer: >60 mL/min (ref >60)
Glucose, Bld: 98 mg/dL (ref 65–99)
Potassium: 3.9 mmol/L (ref 3.5–5.1)
Sodium: 139 mmol/L (ref 135–145)
Total Bilirubin: 0.5 mg/dL (ref 0.3–1.2)
Total Protein: 7.1 g/dL (ref 6.5–8.1)

## 2017-06-12 LAB — URINALYSIS, MICROSCOPIC (REFLEX)
RBC / HPF: >50 RBC/hpf (ref 0–5)
Squamous Epithelial / LPF: NONE SEEN (ref 0–5)

## 2017-06-12 MED ORDER — HYDROMORPHONE HCL 1 MG/ML IJ SOLN
1.0000 mg | Freq: Once | INTRAMUSCULAR | Status: AC
  Administered 2017-06-12: 1 mg via INTRAMUSCULAR
  Filled 2017-06-12: qty 1

## 2017-06-12 MED ORDER — ONDANSETRON 4 MG PO TBDP
4.0000 mg | ORAL_TABLET | Freq: Once | ORAL | Status: AC
  Administered 2017-06-12: 4 mg via ORAL
  Filled 2017-06-12: qty 1

## 2017-06-12 MED ORDER — OXYCODONE-ACETAMINOPHEN 5-325 MG PO TABS
1.0000 | ORAL_TABLET | ORAL | Status: DC | PRN
  Administered 2017-06-12: 1 via ORAL
  Filled 2017-06-12: qty 1

## 2017-06-12 MED ORDER — ONDANSETRON 8 MG PO TBDP
8.0000 mg | ORAL_TABLET | Freq: Once | ORAL | Status: AC
  Administered 2017-06-12: 8 mg via ORAL
  Filled 2017-06-12: qty 1

## 2017-06-12 NOTE — ED Triage Notes (Addendum)
States he has a kidney stone. Back pain with radiation into his right testicle. He took a Vicodin an hour ago with no relief.

## 2017-06-12 NOTE — Discharge Instructions (Addendum)
Your left flank pain is likely due to an obstructed kidney stone.  It is approximately 54mm in size at the proximal ureter.  Please call and follow up closely with your urologist for further management of your condition.

## 2017-06-12 NOTE — ED Notes (Signed)
ED Provider at bedside. 

## 2017-06-12 NOTE — ED Provider Notes (Signed)
[facility] EMERGENCY DEPARTMENT Provider Note   CSN: [id number] Arrival date & time: 06/12/17  1613     History   Chief Complaint Chief Complaint  Patient presents with  . Back Pain    HPI Casey Caldwell is a 37 y.o. male.  HPI   37 year old male with history of kidney stones presenting for evaluation of flank pain.  Patient developed acute onset of left flank pain naproxen 4 hours ago after he was driving home from a massage.  Pain is sharp shooting radiates to his right testicle and felt very similar to prior kidney stones.  When urinate he noticed that his urine was very dark.  He does endorse nausea without vomiting.  No fever or chills now abdominal pain.  States he has had lithotripsy and ureteral stenting in the past for his kidney stone.  He denies any recent injury.  Does have history of chronic pain and takes Vicodin on a daily basis.  He took his pain medication but report no improvement.  Pain is moderate to severe, nothing seems to make it better or worse.  Past Medical History:  Diagnosis Date  . Depression    and panic/anxiety  . GERD (gastroesophageal reflux disease)   . History of kidney stones   . Insomnia   . Kidney stones   . Seizures ([place]) Childhood    Patient Active Problem List   Diagnosis Date Noted  . Encounter for chronic pain management 01/28/2016  . Chronic pain syndrome 10/30/2015  . Adenomatous colon polyp 06/06/2012  . Nephrolithiasis 03/14/2012  . Polycythemia 03/14/2012  . Groin pain 03/14/2012  . RUQ pain 03/14/2012  . Insomnia 03/14/2012  . Seizure disorder ([place]) 03/14/2012  . Anxiety and depression 03/14/2012    Past Surgical History:  Procedure Laterality Date  . APPENDECTOMY  2011  . CHOLECYSTECTOMY, LAPAROSCOPIC    . HYDROCELE EXCISION Left 06/2012  . LAPAROSCOPIC CHOLECYSTECTOMY  1996  . LITHOTRIPSY     four times over the years; most recent 10/08/12  . ORCHIECTOMY Left 11/2012  . SPINAL CORD STIMULATOR INSERTION   2015  . TONSILLECTOMY  1992  . WRIST GANGLION EXCISION  2002   Right        Home Medications    Prior to Admission medications   Medication Sig Start Date End Date Taking? Authorizing Provider  ARIPiprazole (ABILIFY) 5 MG tablet TAKE ONE-HALF TABLET BY MOUTH DAILY 05/14/17   Tonia Ghent, MD  docusate sodium (COLACE) 100 MG capsule Take 1 capsule (100 mg total) by mouth 2 (two) times daily. 01/27/16   Tonia Ghent, MD  escitalopram (LEXAPRO) 20 MG tablet TAKE ONE TABLET BY MOUTH DAILY 05/14/17   Tonia Ghent, MD  HYDROcodone-acetaminophen [facility]) 10-325 MG tablet Take 1 tablet by mouth every 6 (six) hours as needed. 06/02/17   Tonia Ghent, MD  HYDROmorphone (DILAUDID) 2 MG tablet Take 1 tablet (2 mg total) by mouth every 4 (four) hours as needed for severe pain (for kidney stones). 06/02/17   Tonia Ghent, MD  ibuprofen (ADVIL) 200 MG tablet Take 3 tablets (600 mg total) by mouth 2 (two) times daily as needed. 10/29/15   Tonia Ghent, MD  ondansetron (ZOFRAN ODT) 4 MG disintegrating tablet Take 1 tablet (4 mg total) by mouth every 8 (eight) hours as needed for nausea or vomiting. 02/03/17   Tonia Ghent, MD  pantoprazole (PROTONIX) 40 MG tablet TAKE ONE TABLET BY MOUTH TWICE A DAY 01/27/17  Tonia Ghent, MD  polyethylene glycol powder (GLYCOLAX/MIRALAX) powder MIX 17G IN 4 TO 8 OUNCES OF FLUID AND TAKE TWICE DAILY 02/17/15   Tonia Ghent, MD  tamsulosin (FLOMAX) 0.4 MG CAPS capsule Take 1 capsule (0.4 mg total) by mouth daily. 06/09/17   Tonia Ghent, MD  zolpidem (AMBIEN) 10 MG tablet TAKE 1 TABLET BY MOUTH AT BEDTIME AS NEEDED 04/14/17   Tonia Ghent, MD    Family History Family History  Problem Relation Age of Onset  . Stroke Mother   . Liver disease Mother   . Irritable bowel syndrome Mother   . Kidney disease Mother   . Heart disease Father   . Colon polyps Father   . Colon cancer Neg Hx     Social History Social History   Tobacco Use    . Smoking status: Never Smoker  . Smokeless tobacco: Never Used  Substance Use Topics  . Alcohol use: Not Currently    Alcohol/week: 0.0 oz    Comment: rarely  . Drug use: No     Allergies   Phenergan [promethazine hcl]; Tape; Toradol [ketorolac tromethamine]; Trileptal [oxcarbazepine]; and Wellbutrin [bupropion]   Review of Systems Review of Systems  All other systems reviewed and are negative.    Physical Exam Updated Vital Signs BP (!) 139/99   Pulse 73   Temp 98.1 F (36.7 C) (Oral)   Resp 16   Ht 6' (1.829 m)   Wt (!) 153.8 kg (339 lb)   SpO2 96%   BMI 45.98 kg/m   Physical Exam  Constitutional: He appears well-developed and well-nourished. No distress.  Moderately obese male laying in bed appears uncomfortable.  HENT:  Head: Atraumatic.  Eyes: Conjunctivae are normal.  Neck: Neck supple.  Abdominal: Soft. He exhibits no distension. There is no tenderness.  Genitourinary:  Genitourinary Comments: Left CVA tenderness to percussion.  Chaperone present during exam.  Absent left testicle, right testicle tender to palpation.  Normal scrotal appearance.  Neurological: He is alert.  Skin: No rash noted.  Psychiatric: He has a normal mood and affect.  Nursing note and vitals reviewed.    ED Treatments / Results  Labs (all labs ordered are listed, but only abnormal results are displayed) Labs Reviewed  URINALYSIS, ROUTINE W REFLEX MICROSCOPIC - Abnormal; Notable for the following components:      Result Value   Color, Urine   (*)    Value: TEST NOT REPORTED DUE TO COLOR INTERFERENCE OF URINE PIGMENT   APPearance   (*)    Value: TEST NOT REPORTED DUE TO COLOR INTERFERENCE OF URINE PIGMENT   Glucose, UA   (*)    Value: TEST NOT REPORTED DUE TO COLOR INTERFERENCE OF URINE PIGMENT   Hgb urine dipstick   (*)    Value: TEST NOT REPORTED DUE TO COLOR INTERFERENCE OF URINE PIGMENT   Bilirubin Urine   (*)    Value: TEST NOT REPORTED DUE TO COLOR INTERFERENCE OF  URINE PIGMENT   Ketones, ur   (*)    Value: TEST NOT REPORTED DUE TO COLOR INTERFERENCE OF URINE PIGMENT   Protein, ur   (*)    Value: TEST NOT REPORTED DUE TO COLOR INTERFERENCE OF URINE PIGMENT   Nitrite   (*)    Value: TEST NOT REPORTED DUE TO COLOR INTERFERENCE OF URINE PIGMENT   Leukocytes, UA   (*)    Value: TEST NOT REPORTED DUE TO COLOR INTERFERENCE OF URINE PIGMENT  All other components within normal limits  URINALYSIS, MICROSCOPIC (REFLEX) - Abnormal; Notable for the following components:   Bacteria, UA RARE (*)    All other components within normal limits  COMPREHENSIVE METABOLIC PANEL - Abnormal; Notable for the following components:   AST 44 (*)    All other components within normal limits  CBC WITH DIFFERENTIAL/PLATELET - Abnormal; Notable for the following components:   Hemoglobin 17.1 (*)    MCHC 36.7 (*)    All other components within normal limits    EKG None  Radiology US Renal  Result Date: 06/12/2017 CLINICAL DATA:  Left flank pain and hematuria EXAM: RENAL / URINARY TRACT ULTRASOUND COMPLETE COMPARISON:  Ultrasound 05/16/2017, 05/01/2017 FINDINGS: Right Kidney: Length: 12 cm. Cortical echogenicity within normal limits. Echogenic focus mid to upper pole measuring up to 7 mm. No hydronephrosis Left Kidney: Length: 13.5 cm. Cortical echogenicity within normal limits. Moderate hydronephrosis and proximal hydroureter, increased compared to 05/16/2017 ultrasound. Small echogenic focus mid left kidney 4 mm, possible small stone. Bladder: Nearly empty. IMPRESSION: 1. Moderate left hydronephrosis, increased compared to most recent prior renal ultrasound. There is proximal hydroureter. 2. Small echogenic foci within both kidneys, likely reflecting small stones Electronically Signed   By: Donavan Foil M.D.   On: 06/12/2017 22:06    Procedures Procedures (including critical care time)  Medications Ordered in ED Medications  oxyCODONE-acetaminophen (PERCOCET/ROXICET)  5-325 MG per tablet 1 tablet (1 tablet Oral Given 06/12/17 1833)  ondansetron (ZOFRAN-ODT) disintegrating tablet 8 mg (8 mg Oral Given 06/12/17 1833)  HYDROmorphone (DILAUDID) injection 1 mg (1 mg Intramuscular Given 06/12/17 2024)  ondansetron (ZOFRAN-ODT) disintegrating tablet 4 mg (4 mg Oral Given 06/12/17 2023)  HYDROmorphone (DILAUDID) injection 1 mg (1 mg Intramuscular Given 06/12/17 2122)     Initial Impression / Assessment and Plan / ED Course  I have reviewed the triage vital signs and the nursing notes.  Pertinent labs & imaging results that were available during my care of the patient were reviewed by me and considered in my medical decision making (see chart for details).     BP (!) 151/120 (BP Location: Right Wrist)   Pulse 90   Temp 98.1 F (36.7 C) (Oral)   Resp 16   Ht 6' (1.829 m)   Wt (!) 153.8 kg (339 lb)   SpO2 96%   BMI 45.98 kg/m    Final Clinical Impressions(s) / ED Diagnoses   Final diagnoses:  Kidney stone on left side    ED Discharge Orders    None     8:16 PM Patient with history of chronic pain as well has history of kidney stone here with acute onset of left flank pain that radiates to his right testicle.  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  He has chronic groin pain.  He does have left CVA tenderness and urine result is obscured by color interference of the urine pigment.  Will obtain renal ultrasound study.  Renal function is normal.  Pain medication given.  10:52 PM Labs are reassuring, normal renal function.  Normal WBC.  Ultrasound of renal demonstrate moderate left hydronephrosis, increased compared to most recent prior renal ultrasound.  There is proximal hydroureter.  I discussed this finding with patient.  Patient has received several doses of Dilaudid pain medication ED and states that the pain has since improved.  I encourage patient to follow-up closely with his urologist for further management of his condition.  He does  have pain  medication at home include hydrocodone and ibuprofen.  He understand to return if his condition worsen.   Domenic Moras, PA-C 06/12/17 3832    Fredia Sorrow, MD 06/19/17 [phone]

## 2017-06-15 ENCOUNTER — Other Ambulatory Visit: Payer: Self-pay

## 2017-06-15 ENCOUNTER — Emergency Department (HOSPITAL_BASED_OUTPATIENT_CLINIC_OR_DEPARTMENT_OTHER)
Admission: EM | Admit: 2017-06-15 | Discharge: 2017-06-15 | Disposition: A | Discharge status: Home / Self Care | Payer: Self-pay | Attending: Urology | Admitting: Urology

## 2017-06-15 ENCOUNTER — Emergency Department (HOSPITAL_BASED_OUTPATIENT_CLINIC_OR_DEPARTMENT_OTHER): Payer: Self-pay

## 2017-06-15 ENCOUNTER — Emergency Department (HOSPITAL_COMMUNITY): Payer: Self-pay

## 2017-06-15 ENCOUNTER — Encounter (HOSPITAL_BASED_OUTPATIENT_CLINIC_OR_DEPARTMENT_OTHER): Payer: Self-pay | Admitting: Emergency Medicine

## 2017-06-15 ENCOUNTER — Emergency Department (HOSPITAL_COMMUNITY): Payer: Self-pay | Admitting: Certified Registered Nurse Anesthetist

## 2017-06-15 ENCOUNTER — Encounter (HOSPITAL_COMMUNITY)
Admission: EM | Disposition: A | Discharge status: Home / Self Care | Payer: Self-pay | Source: Home / Self Care | Attending: Emergency Medicine

## 2017-06-15 DIAGNOSIS — K219 Gastro-esophageal reflux disease without esophagitis: Secondary | ICD-10-CM | POA: Insufficient documentation

## 2017-06-15 DIAGNOSIS — N200 Calculus of kidney: Secondary | ICD-10-CM

## 2017-06-15 DIAGNOSIS — F410 Panic disorder [episodic paroxysmal anxiety] without agoraphobia: Secondary | ICD-10-CM | POA: Insufficient documentation

## 2017-06-15 DIAGNOSIS — N201 Calculus of ureter: Secondary | ICD-10-CM | POA: Insufficient documentation

## 2017-06-15 DIAGNOSIS — G4700 Insomnia, unspecified: Secondary | ICD-10-CM | POA: Insufficient documentation

## 2017-06-15 DIAGNOSIS — F329 Major depressive disorder, single episode, unspecified: Secondary | ICD-10-CM | POA: Insufficient documentation

## 2017-06-15 HISTORY — PX: CYSTOSCOPY/URETEROSCOPY/HOLMIUM LASER: SHX6545

## 2017-06-15 LAB — CBC WITH DIFFERENTIAL/PLATELET
Basophils Absolute: 0 10*3/uL (ref 0.0–0.1)
Basophils Relative: 0 %
Eosinophils Absolute: 0.3 10*3/uL (ref 0.0–0.7)
Eosinophils Relative: 3 %
HCT: 44.1 % (ref 39.0–52.0)
Hemoglobin: 16.1 g/dL (ref 13.0–17.0)
Lymphocytes Relative: 25 %
Lymphs Abs: 2.6 10*3/uL (ref 0.7–4.0)
MCH: 31.2 pg (ref 26.0–34.0)
MCHC: 36.5 g/dL — ABNORMAL HIGH (ref 30.0–36.0)
MCV: 85.5 fL (ref 78.0–100.0)
Monocytes Absolute: 1.3 10*3/uL — ABNORMAL HIGH (ref 0.1–1.0)
Monocytes Relative: 12 %
Neutro Abs: 6.2 10*3/uL (ref 1.7–7.7)
Neutrophils Relative %: 60 %
Platelets: 283 10*3/uL (ref 150–400)
RBC: 5.16 MIL/uL (ref 4.22–5.81)
RDW: 13.5 % (ref 11.5–15.5)
WBC: 10.3 10*3/uL (ref 4.0–10.5)

## 2017-06-15 LAB — URINALYSIS, ROUTINE W REFLEX MICROSCOPIC
Bilirubin Urine: NEGATIVE
Glucose, UA: NEGATIVE mg/dL
Ketones, ur: NEGATIVE mg/dL
Leukocytes, UA: NEGATIVE
Nitrite: NEGATIVE
Protein, ur: NEGATIVE mg/dL
Specific Gravity, Urine: 1.01 (ref 1.005–1.030)
pH: 6 (ref 5.0–8.0)

## 2017-06-15 LAB — BASIC METABOLIC PANEL
Anion gap: 10 (ref 5–15)
BUN: 13 mg/dL (ref 6–20)
CO2: 25 mmol/L (ref 22–32)
Calcium: 8.7 mg/dL — ABNORMAL LOW (ref 8.9–10.3)
Chloride: 100 mmol/L — ABNORMAL LOW (ref 101–111)
Creatinine, Ser: 1.24 mg/dL (ref 0.61–1.24)
GFR calc Af Amer: >60 mL/min (ref >60)
GFR calc non Af Amer: >60 mL/min (ref >60)
Glucose, Bld: 119 mg/dL — ABNORMAL HIGH (ref 65–99)
Potassium: 3.6 mmol/L (ref 3.5–5.1)
Sodium: 135 mmol/L (ref 135–145)

## 2017-06-15 LAB — URINALYSIS, MICROSCOPIC (REFLEX): Bacteria, UA: NONE SEEN

## 2017-06-15 SURGERY — CYSTOURETEROSCOPY, USING HOLMIUM LASER
Anesthesia: General | Laterality: Left

## 2017-06-15 MED ORDER — ONDANSETRON HCL 4 MG/2ML IJ SOLN
4.0000 mg | Freq: Once | INTRAMUSCULAR | Status: AC
  Administered 2017-06-15: 4 mg via INTRAVENOUS
  Filled 2017-06-15: qty 2

## 2017-06-15 MED ORDER — DEXAMETHASONE SODIUM PHOSPHATE 10 MG/ML IJ SOLN
INTRAMUSCULAR | Status: DC | PRN
  Administered 2017-06-15: 10 mg via INTRAVENOUS

## 2017-06-15 MED ORDER — FENTANYL CITRATE (PF) 100 MCG/2ML IJ SOLN: 100.0000 ug | Freq: Once | INTRAMUSCULAR | Status: DC

## 2017-06-15 MED ORDER — IOHEXOL 300 MG/ML  SOLN
INTRAMUSCULAR | Status: DC | PRN
  Administered 2017-06-15: 5 mL via URETHRAL

## 2017-06-15 MED ORDER — OXYCODONE HCL 5 MG PO TABS
5.0000 mg | ORAL_TABLET | Freq: Once | ORAL | Status: AC | PRN
  Administered 2017-06-15: 5 mg via ORAL

## 2017-06-15 MED ORDER — MIDAZOLAM HCL 5 MG/5ML IJ SOLN
INTRAMUSCULAR | Status: DC | PRN
  Administered 2017-06-15: 2 mg via INTRAVENOUS

## 2017-06-15 MED ORDER — PROPOFOL 10 MG/ML IV BOLUS
INTRAVENOUS | Status: AC
  Filled 2017-06-15: qty 20

## 2017-06-15 MED ORDER — HYDROMORPHONE HCL 1 MG/ML IJ SOLN
INTRAMUSCULAR | Status: AC
  Filled 2017-06-15: qty 1

## 2017-06-15 MED ORDER — OXYBUTYNIN CHLORIDE 5 MG PO TABS: 5.0000 mg | ORAL_TABLET | Freq: Three times a day (TID) | ORAL | 1 refills | Status: DC

## 2017-06-15 MED ORDER — CEPHALEXIN 500 MG PO CAPS: 500.0000 mg | ORAL_CAPSULE | Freq: Two times a day (BID) | ORAL | 0 refills | Status: DC

## 2017-06-15 MED ORDER — DEXAMETHASONE SODIUM PHOSPHATE 10 MG/ML IJ SOLN
INTRAMUSCULAR | Status: AC
  Filled 2017-06-15: qty 1

## 2017-06-15 MED ORDER — HYDROMORPHONE HCL 1 MG/ML IJ SOLN
1.0000 mg | Freq: Once | INTRAMUSCULAR | Status: AC
  Administered 2017-06-15: 1 mg via INTRAVENOUS
  Filled 2017-06-15: qty 1

## 2017-06-15 MED ORDER — LIDOCAINE 2% (20 MG/ML) 5 ML SYRINGE
INTRAMUSCULAR | Status: DC | PRN
  Administered 2017-06-15: 60 mg via INTRAVENOUS

## 2017-06-15 MED ORDER — HYDROMORPHONE HCL 1 MG/ML IJ SOLN
2.0000 mg | Freq: Once | INTRAMUSCULAR | Status: AC
  Administered 2017-06-15: 2 mg via INTRAVENOUS
  Filled 2017-06-15: qty 2

## 2017-06-15 MED ORDER — MEPERIDINE HCL 50 MG/ML IJ SOLN: 6.2500 mg | INTRAMUSCULAR | Status: DC | PRN

## 2017-06-15 MED ORDER — SODIUM CHLORIDE 0.9 % IV SOLN
INTRAVENOUS | Status: DC
  Administered 2017-06-15: 125 mL/h via INTRAVENOUS

## 2017-06-15 MED ORDER — HYDROMORPHONE HCL 1 MG/ML IJ SOLN: 1.0000 mg | Freq: Once | INTRAMUSCULAR | Status: DC

## 2017-06-15 MED ORDER — ONDANSETRON HCL 4 MG/2ML IJ SOLN: 4.0000 mg | Freq: Once | INTRAMUSCULAR | Status: DC | PRN

## 2017-06-15 MED ORDER — SODIUM CHLORIDE 0.9 % IR SOLN
Status: DC | PRN
  Administered 2017-06-15: 3000 mL via INTRAVESICAL

## 2017-06-15 MED ORDER — HALOPERIDOL LACTATE 5 MG/ML IJ SOLN: 5.0000 mg | Freq: Once | INTRAMUSCULAR | Status: DC

## 2017-06-15 MED ORDER — ONDANSETRON HCL 4 MG/2ML IJ SOLN
INTRAMUSCULAR | Status: DC | PRN
  Administered 2017-06-15: 4 mg via INTRAVENOUS

## 2017-06-15 MED ORDER — DEXTROSE 5 % IV SOLN
3.0000 g | INTRAVENOUS | Status: AC
  Administered 2017-06-15: 3 g via INTRAVENOUS
  Filled 2017-06-15: qty 3

## 2017-06-15 MED ORDER — ACETAMINOPHEN 10 MG/ML IV SOLN
INTRAVENOUS | Status: AC
  Filled 2017-06-15: qty 100

## 2017-06-15 MED ORDER — CEFAZOLIN (ANCEF) 1 G IV SOLR
3.0000 g | INTRAVENOUS | Status: DC
  Filled 2017-06-15: qty 3

## 2017-06-15 MED ORDER — ONDANSETRON HCL 4 MG/2ML IJ SOLN
INTRAMUSCULAR | Status: AC
  Filled 2017-06-15: qty 2

## 2017-06-15 MED ORDER — LIDOCAINE 2% (20 MG/ML) 5 ML SYRINGE
INTRAMUSCULAR | Status: AC
  Filled 2017-06-15: qty 5

## 2017-06-15 MED ORDER — FENTANYL CITRATE (PF) 100 MCG/2ML IJ SOLN
INTRAMUSCULAR | Status: DC | PRN
  Administered 2017-06-15 (×2): 50 ug via INTRAVENOUS

## 2017-06-15 MED ORDER — PROPOFOL 10 MG/ML IV BOLUS
INTRAVENOUS | Status: DC | PRN
  Administered 2017-06-15: 200 mg via INTRAVENOUS

## 2017-06-15 MED ORDER — FENTANYL CITRATE (PF) 100 MCG/2ML IJ SOLN
INTRAMUSCULAR | Status: AC
  Filled 2017-06-15: qty 2

## 2017-06-15 MED ORDER — OXYCODONE HCL 5 MG/5ML PO SOLN
5.0000 mg | Freq: Once | ORAL | Status: AC | PRN
  Filled 2017-06-15: qty 5

## 2017-06-15 MED ORDER — HYDROMORPHONE HCL 1 MG/ML IJ SOLN
0.2500 mg | INTRAMUSCULAR | Status: DC | PRN
  Administered 2017-06-15 (×4): 0.5 mg via INTRAVENOUS

## 2017-06-15 MED ORDER — LACTATED RINGERS IV SOLN
INTRAVENOUS | Status: DC
  Administered 2017-06-15: 1000 mL via INTRAVENOUS

## 2017-06-15 MED ORDER — OXYCODONE HCL 5 MG PO TABS
ORAL_TABLET | ORAL | Status: AC
  Filled 2017-06-15: qty 1

## 2017-06-15 MED ORDER — MIDAZOLAM HCL 2 MG/2ML IJ SOLN
INTRAMUSCULAR | Status: AC
  Filled 2017-06-15: qty 2

## 2017-06-15 MED ORDER — ACETAMINOPHEN 10 MG/ML IV SOLN
INTRAVENOUS | Status: DC | PRN
  Administered 2017-06-15: 1000 mg via INTRAVENOUS

## 2017-06-15 SURGICAL SUPPLY — 24 items
BAG URO CATCHER STRL LF (MISCELLANEOUS) ×2 IMPLANT
BASKET LASER NITINOL 1.9FR (BASKET) IMPLANT
BASKET ZERO TIP NITINOL 2.4FR (BASKET) IMPLANT
CATH INTERMIT  6FR 70CM (CATHETERS) ×2 IMPLANT
CLOTH BEACON ORANGE TIMEOUT ST (SAFETY) IMPLANT
COVER FOOTSWITCH UNIV (MISCELLANEOUS) IMPLANT
COVER SURGICAL LIGHT HANDLE (MISCELLANEOUS) IMPLANT
EXTRACTOR STONE NITINOL NGAGE (UROLOGICAL SUPPLIES) ×2 IMPLANT
FIBER LASER FLEXIVA 365 (UROLOGICAL SUPPLIES) IMPLANT
FIBER LASER TRAC TIP (UROLOGICAL SUPPLIES) IMPLANT
GLOVE BIOGEL M 8.0 STRL (GLOVE) ×6 IMPLANT
GOWN STRL REUS W/ TWL XL LVL3 (GOWN DISPOSABLE) IMPLANT
GOWN STRL REUS W/TWL LRG LVL3 (GOWN DISPOSABLE) ×4 IMPLANT
GOWN STRL REUS W/TWL XL LVL3 (GOWN DISPOSABLE)
GUIDEWIRE ANG ZIPWIRE 038X150 (WIRE) IMPLANT
GUIDEWIRE STR DUAL SENSOR (WIRE) ×2 IMPLANT
IV NS 1000ML (IV SOLUTION) ×1
IV NS 1000ML BAXH (IV SOLUTION) ×1 IMPLANT
MANIFOLD NEPTUNE II (INSTRUMENTS) ×2 IMPLANT
PACK CYSTO (CUSTOM PROCEDURE TRAY) ×2 IMPLANT
SHEATH URETERAL 12FRX35CM (MISCELLANEOUS) ×2 IMPLANT
STENT CONTOUR 6FRX26X.038 (STENTS) ×2 IMPLANT
TUBING CONNECTING 10 (TUBING) ×2 IMPLANT
TUBING UROLOGY SET (TUBING) ×2 IMPLANT

## 2017-06-15 NOTE — ED Provider Notes (Signed)
6:04 AM Patient sent from [facility] after being seen for left flank and groin pain.  He was diagnosed with an obstructing 5 x 8 mm stone in the distal left ureter with moderate hydroureteronephrosis.  He was accepted by Dr. Diona Fanti for definitive intervention.  He is complaining of moderate to severe pain in his left flank despite Dilaudid approximately 4:35 AM.  Patient is awake and alert in no acute distress.  He has mild left CVA tenderness and left lower quadrant tenderness.  Dr. Diona Fanti has been contacted and will see the patient in the ED.     Anel Creighton, Jenny Reichmann, MD 06/15/17 [phone]

## 2017-06-15 NOTE — Discharge Instructions (Signed)
1. You may see some blood in the urine and may have some burning with urination for 48-72 hours. You also may notice that you have to urinate more frequently or urgently after your procedure which is normal.  2. You should call should you develop an inability urinate, fever > 101, persistent nausea and vomiting that prevents you from eating or drinking to stay hydrated.  3. If you have a stent, you will likely urinate more frequently and urgently until the stent is removed and you may experience some discomfort/pain in the lower abdomen and flank especially when urinating. You may take pain medication prescribed to you if needed for pain. You may also intermittently have blood in the urine until the stent is removed.  It is okay to remove the stent by pulling on the string on Monday morning.

## 2017-06-15 NOTE — Anesthesia Procedure Notes (Signed)
Procedure Name: LMA Insertion Date/Time: 06/15/2017 10:48 AM Performed by: Mitzie Na, CRNA Pre-anesthesia Checklist: Patient identified, Emergency Drugs available, Suction available, Timeout performed and Patient being monitored Patient Re-evaluated:Patient Re-evaluated prior to induction Preoxygenation: Pre-oxygenation with 100% oxygen Induction Type: IV induction LMA: LMA inserted LMA Size: 5.0 Number of attempts: 1 Dental Injury: Teeth and Oropharynx as per pre-operative assessment

## 2017-06-15 NOTE — ED Triage Notes (Signed)
Pt c/o 10/10 groin pain states he was here 2 days ago for a kidney stone, but he is not getting better with pain medicine. Last Hydrocodone two hours pta.

## 2017-06-15 NOTE — Op Note (Signed)
Preoperative diagnosis: 7 mm left distal ureteral stone  Postoperative diagnosis: Same  Principal procedure: Cystoscopy, left retrograde ureteropyelogram, fluoroscopic interpretation, left ureteroscopy with extraction of left ureteral calculi, placement of 26 cm x 6 French contour double-J stent with tether  Surgeon: Deliliah Spranger  Anesthesia: General with LMA  Complications: None  Drains: 26 cm x 6 French contour double-J stent  Specimen: Stone, to the patient  Estimated blood loss: None  Indications: 37 year old male with long-standing symptomatic left distal ureteral stone with pain and obstruction.  He presents at this time for definitive ureteroscopic management, having presented to urgent care or emergency room's quite a few times over the last couple of months.  I have prepared the patient for management including ureteroscopy.  Risks and complications have been discussed.  He understands these and desires to proceed.  Description of procedure: The patient was properly identified and marked in the holding area.  He received preoperative IV antibiotics.  Was taken to the operating room where general anesthetic was administered with the LMA.  He is placed in the dorsolithotomy position.  Genitalia and perineum were prepped and draped in proper timeout was performed.  A 22 French panendoscope was advanced through his urethra.  Prostate was nonobstructing but there was a high riding bladder neck.  Bladder was inspected circumferentially and found to be normal.  Left ureteral orifice cannulated with a 6 [place] open-ended catheter and using Omnipaque retrograde ureteral pyelogram was performed.  This revealed a filling defect consistent with stone about 3 cm of the ureter as well as proximal hydroureteronephrosis with no other filling defects seen.  Guidewire was passed through the open-ended catheter, easily guided into the upper pole calyceal system of the left kidney.  The cystoscope and  open-ended catheter were removed.  I then dilated the ureteral orifice first with the obturator than the entire 12/14 ureteral access catheter.  This was then removed.  I then negotiated a 6 French dual-lumen semirigid ureteroscope through the urethra, into the bladder, and then up to the stone in the ureter.  It then fragmented into 2-3 smaller pieces, each of which were snared with the engage basket and then deposited into the bladder.  Following removal of all fragments, the scope was advanced up to the ureteropelvic junction where no further stones were seen.  The ureteroscope was then removed.  The cystoscope was then placed into the bladder and the fragments irrigated from the bladder.  The guidewire was then backloaded through the scope, and using fluoroscopic and cystoscopic guidance a 26 cm x 6 French contour double-J stent with the tether remaining was negotiated into the left ureter with good proximal distal curls seen after removal of the guidewire.  The bladder was drained.  The scope was then removed.  The tether was taped to the patient's penis having been trimmed.  At this point the patient was then awakened.  Was taken to the PACU in stable condition, having tolerated the procedure well.

## 2017-06-15 NOTE — Anesthesia Preprocedure Evaluation (Addendum)
Anesthesia Evaluation  Patient identified by MRN, date of birth, ID band Patient awake    Reviewed: Allergy & Precautions, NPO status , Patient's Chart, lab work & pertinent test results  Airway Mallampati: II  TM Distance: >3 FB Neck ROM: Full    Dental no notable dental hx.    Pulmonary neg pulmonary ROS,    Pulmonary exam normal breath sounds clear to auscultation       Cardiovascular negative cardio ROS Normal cardiovascular exam Rhythm:Regular Rate:Normal     Neuro/Psych Seizures -,  PSYCHIATRIC DISORDERS Anxiety Depression    GI/Hepatic Neg liver ROS, GERD  ,  Endo/Other  negative endocrine ROS  Renal/GU Renal disease     Musculoskeletal negative musculoskeletal ROS (+)   Abdominal   Peds  Hematology negative hematology ROS (+)   Anesthesia Other Findings   Reproductive/Obstetrics negative OB ROS                             Anesthesia Physical Anesthesia Plan  ASA: III  Anesthesia Plan: General   Post-op Pain Management:    Induction: Intravenous  PONV Risk Score and Plan: 3 and Ondansetron, Dexamethasone and Midazolam  Airway Management Planned: LMA  Additional Equipment:   Intra-op Plan:   Post-operative Plan: Extubation in OR  Informed Consent: I have reviewed the patients History and Physical, chart, labs and discussed the procedure including the risks, benefits and alternatives for the proposed anesthesia with the patient or authorized representative who has indicated his/her understanding and acceptance.   Dental advisory given  Plan Discussed with: CRNA  Anesthesia Plan Comments:       Anesthesia Quick Evaluation

## 2017-06-15 NOTE — H&P (Signed)
H&P  Chief Complaint: Kidney stone  History of Present Illness: 37 year old male with prior history of urolithiasis, transferred from [place] for management of left distal ureteral stone.  The patient's urologist is Dr. Nelma Rothman at [facility].  He has presented to the emergency room several times over the past couple of months with a left ureteral stone.  He has not followed up in the office at any time for  painful episodes, but presented to the emergency room.  Because of intractable pain and frequent use of the emergency room facility, the patient was transferred here for definitive management of the stone.  He denies recent fever.  He is having nausea.  The patient does have chronic pain which is managed with a spinal stimulator and a pain clinic.  He is allergic to Toradol.  Past Medical History:  Diagnosis Date  . Depression    and panic/anxiety  . GERD (gastroesophageal reflux disease)   . History of kidney stones   . Insomnia   . Kidney stones   . Seizures ([place]) Childhood    Past Surgical History:  Procedure Laterality Date  . APPENDECTOMY  2011  . CHOLECYSTECTOMY, LAPAROSCOPIC    . HYDROCELE EXCISION Left 06/2012  . LAPAROSCOPIC CHOLECYSTECTOMY  1996  . LITHOTRIPSY     four times over the years; most recent 10/08/12  . ORCHIECTOMY Left 11/2012  . SPINAL CORD STIMULATOR INSERTION  2015  . TONSILLECTOMY  1992  . WRIST GANGLION EXCISION  2002   Right    Home Medications:    Allergies:  Allergies  Allergen Reactions  . Phenergan [Promethazine Hcl]   . Tape Hives    Adhesive tape  . Toradol [Ketorolac Tromethamine]     "feels like I am going to crawl out of my skin"  . Trileptal [Oxcarbazepine] Other (See Comments)    Profound insomnia, worsening mood.   . Wellbutrin [Bupropion]     H/o SZ d/o.     Family History  Problem Relation Age of Onset  . Stroke Mother   . Liver disease Mother   . Irritable bowel  syndrome Mother   . Kidney disease Mother   . Heart disease Father   . Colon polyps Father   . Colon cancer Neg Hx     Social History:  reports that he has never smoked. He has never used smokeless tobacco. He reports that he drank alcohol. He reports that he does not use drugs.  ROS: A complete review of systems was performed.  All systems are negative except for pertinent findings as noted.  Physical Exam:  Vital signs in last 24 hours: Temp:  [97.5 F (36.4 C)] 97.5 F (36.4 C) (05/23 0311) Pulse Rate:  [96-110] 96 (05/23 0433) Resp:  [16-18] 18 (05/23 0433) BP: (140-170)/(103-121) 140/103 (05/23 0433) SpO2:  [93 %-97 %] 93 % (05/23 0433) Weight:  [153.8 kg (339 lb)] 153.8 kg (339 lb) (05/23 3016) Constitutional:  Alert and oriented, No acute distress Cardiovascular: Regular rate  Respiratory: Normal respiratory effort,  GI: Abdomen is soft, obese.  Left lower quadrant tenderness. Genitourinary: Left CVAT.  Lymphatic: No lymphadenopathy Neurologic: Grossly intact, no focal deficits Psychiatric: Normal mood and affect  Laboratory Data:  Recent Labs    06/12/17 1823 06/15/17 0407  WBC 9.8 10.3  HGB 17.1* 16.1  HCT 46.6 44.1  PLT 313 283    Recent Labs    06/12/17 1823 06/15/17 0407  NA 139 135  K 3.9 3.6  CL 105 100*  GLUCOSE 98 119*  BUN 14 13  CALCIUM 9.3 8.7*  CREATININE 0.86 1.24     Results for orders placed or performed during the hospital encounter of 06/15/17 (from the past 24 hour(s))  CBC with Differential/Platelet     Status: Abnormal   Collection Time: 06/15/17  4:07 AM  Result Value Ref Range   WBC 10.3 4.0 - 10.5 K/uL   RBC 5.16 4.22 - 5.81 MIL/uL   Hemoglobin 16.1 13.0 - 17.0 g/dL   HCT 44.1 39.0 - 52.0 %   MCV 85.5 78.0 - 100.0 fL   MCH 31.2 26.0 - 34.0 pg   MCHC 36.5 (H) 30.0 - 36.0 g/dL   RDW 13.5 11.5 - 15.5 %   Platelets 283 150 - 400 K/uL   Neutrophils Relative % 60 %   Neutro Abs 6.2 1.7 - 7.7 K/uL   Lymphocytes Relative 25  %   Lymphs Abs 2.6 0.7 - 4.0 K/uL   Monocytes Relative 12 %   Monocytes Absolute 1.3 (H) 0.1 - 1.0 K/uL   Eosinophils Relative 3 %   Eosinophils Absolute 0.3 0.0 - 0.7 K/uL   Basophils Relative 0 %   Basophils Absolute 0.0 0.0 - 0.1 K/uL  Basic metabolic panel     Status: Abnormal   Collection Time: 06/15/17  4:07 AM  Result Value Ref Range   Sodium 135 135 - 145 mmol/L   Potassium 3.6 3.5 - 5.1 mmol/L   Chloride 100 (L) 101 - 111 mmol/L   CO2 25 22 - 32 mmol/L   Glucose, Bld 119 (H) 65 - 99 mg/dL   BUN 13 6 - 20 mg/dL   Creatinine, Ser 1.24 0.61 - 1.24 mg/dL   Calcium 8.7 (L) 8.9 - 10.3 mg/dL   GFR calc non Af Amer >60 >60 mL/min   GFR calc Af Amer >60 >60 mL/min   Anion gap 10 5 - 15  Urinalysis, Routine w reflex microscopic     Status: Abnormal   Collection Time: 06/15/17  4:07 AM  Result Value Ref Range   Color, Urine YELLOW YELLOW   APPearance CLEAR CLEAR   Specific Gravity, Urine 1.010 1.005 - 1.030   pH 6.0 5.0 - 8.0   Glucose, UA NEGATIVE NEGATIVE mg/dL   Hgb urine dipstick LARGE (A) NEGATIVE   Bilirubin Urine NEGATIVE NEGATIVE   Ketones, ur NEGATIVE NEGATIVE mg/dL   Protein, ur NEGATIVE NEGATIVE mg/dL   Nitrite NEGATIVE NEGATIVE   Leukocytes, UA NEGATIVE NEGATIVE  Urinalysis, Microscopic (reflex)     Status: None   Collection Time: 06/15/17  4:07 AM  Result Value Ref Range   RBC / HPF 11-20 0 - 5 RBC/hpf   WBC, UA 0-5 0 - 5 WBC/hpf   Bacteria, UA NONE SEEN NONE SEEN   Squamous Epithelial / LPF 0-5 0 - 5   Ca Oxalate Crys, UA PRESENT    No results found for this or any previous visit (from the past 240 hour(s)).  Renal Function: Recent Labs    06/12/17 1823 06/15/17 0407  CREATININE 0.86 1.24   Estimated Creatinine Clearance: 122.3 mL/min (by C-G formula based on SCr of 1.24 mg/dL).  Radiologic Imaging: Ct Renal Stone Study  Result Date: 06/15/2017 CLINICAL DATA:  Left flank pain for 2 days. EXAM: CT ABDOMEN AND PELVIS WITHOUT CONTRAST TECHNIQUE:  Multidetector CT imaging of the abdomen and pelvis was performed following the standard protocol without IV contrast. COMPARISON:  Multiple  prior exams most recently ultrasound 06/12/2017. Most recent CT 04/16/2017 FINDINGS: Lower chest: Lung bases are clear. Hepatobiliary: Again seen hepatic steatosis. No discrete focal lesion. Clips in the gallbladder fossa postcholecystectomy. No biliary dilatation. Pancreas: No ductal dilatation or inflammation. Spleen: Normal in size without focal abnormality. Adrenals/Urinary Tract: Normal adrenal glands. Obstructing 5 x 8 mm stone in the distal left ureter with moderate proximal hydroureteronephrosis and perinephric edema. Punctate nonobstructing stones in the upper left kidney. No right hydronephrosis. Nonobstructing stone in the lower right kidney. Urinary bladder is decompressed without stone. Stomach/Bowel: Stomach is within normal limits. Appendix surgically absent. No evidence of bowel wall thickening, distention, or inflammatory changes. Vascular/Lymphatic: Normal caliber abdominal vessels. No adenopathy. Reproductive: Prostate is unremarkable. Other: Small fat containing umbilical hernia. No free air or ascites. Musculoskeletal: Spinal stimulator in place. There are no acute or suspicious osseous abnormalities. IMPRESSION: 1. Obstructing 5 x 8 mm stone in the distal left ureter with moderate hydroureteronephrosis. 2. Bilateral nonobstructing renal calculi. 3. Hepatic steatosis. Electronically Signed   By: Jeb Levering M.D.   On: 06/15/2017 03:47    Impression/Assessment:  Persistent left distal ureteral stone with non-passage and recurrent pain  Plan:  Cystoscopy, left retrograde, left ureteroscopy with possible holmium laser lithotripsy and extraction of left distal ureteral stone, possible double-J stent placement.  I discussed the procedure as well as risks and complications with this patient.  He understands these and desires to proceed.

## 2017-06-15 NOTE — Transfer of Care (Signed)
Immediate Anesthesia Transfer of Care Note  Patient: Casey Caldwell  Procedure(s) Performed: CYSTOSCOPY/LEFT URETEROSCOPYSTONE EXTRACTION/LEFT RETROGRADE (Left )  Patient Location: PACU  Anesthesia Type:General  Level of Consciousness: awake, alert , oriented and patient cooperative  Airway & Oxygen Therapy: Patient Spontanous Breathing and Patient connected to face mask oxygen  Post-op Assessment: Report given to RN, Post -op Vital signs reviewed and stable and Patient moving all extremities  Post vital signs: Reviewed and stable  Last Vitals:  Vitals Value Taken Time  BP 144/86 06/15/2017 11:27 AM  Temp    Pulse 84 06/15/2017 11:29 AM  Resp 14 06/15/2017 11:29 AM  SpO2 99 % 06/15/2017 11:29 AM  Vitals shown include unvalidated device data.  Last Pain:  Vitals:   06/15/17 1127  TempSrc:   PainSc: (P) Asleep      Patients Stated Pain Goal: 4 (22/57/50 5183)  Complications: No apparent anesthesia complications

## 2017-06-15 NOTE — ED Provider Notes (Signed)
[place] EMERGENCY DEPARTMENT Provider Note   CSN: [id number] Arrival date & time: 06/15/17  0303     History   Chief Complaint Chief Complaint  Patient presents with  . Groin Pain    HPI Casey Caldwell is a 37 y.o. male.  The history is provided by the patient.  Flank Pain  This is a chronic (acute on chronic has had this episode for 6 weeks and hasn't followed up worse in the last 24 hours despite home dilaudid and vicodin) problem. The problem occurs constantly. The problem has not changed since onset.Associated symptoms include abdominal pain. Pertinent negatives include no chest pain, no headaches and no shortness of breath. Nothing aggravates the symptoms. Nothing relieves the symptoms. Treatments tried: po dilaudid and vicodin. The treatment provided no relief.    Past Medical History:  Diagnosis Date  . Depression    and panic/anxiety  . GERD (gastroesophageal reflux disease)   . History of kidney stones   . Insomnia   . Kidney stones   . Seizures ([place]) Childhood    Patient Active Problem List   Diagnosis Date Noted  . Encounter for chronic pain management 01/28/2016  . Chronic pain syndrome 10/30/2015  . Adenomatous colon polyp 06/06/2012  . Nephrolithiasis 03/14/2012  . Polycythemia 03/14/2012  . Groin pain 03/14/2012  . RUQ pain 03/14/2012  . Insomnia 03/14/2012  . Seizure disorder ([place]) 03/14/2012  . Anxiety and depression 03/14/2012    Past Surgical History:  Procedure Laterality Date  . APPENDECTOMY  2011  . CHOLECYSTECTOMY, LAPAROSCOPIC    . HYDROCELE EXCISION Left 06/2012  . LAPAROSCOPIC CHOLECYSTECTOMY  1996  . LITHOTRIPSY     four times over the years; most recent 10/08/12  . ORCHIECTOMY Left 11/2012  . SPINAL CORD STIMULATOR INSERTION  2015  . TONSILLECTOMY  1992  . WRIST GANGLION EXCISION  2002   Right        Home Medications    Prior to Admission medications   Medication Sig Start Date End Date Taking? Authorizing  Provider  ARIPiprazole (ABILIFY) 5 MG tablet TAKE ONE-HALF TABLET BY MOUTH DAILY 05/14/17   Tonia Ghent, MD  docusate sodium (COLACE) 100 MG capsule Take 1 capsule (100 mg total) by mouth 2 (two) times daily. 01/27/16   Tonia Ghent, MD  escitalopram (LEXAPRO) 20 MG tablet TAKE ONE TABLET BY MOUTH DAILY 05/14/17   Tonia Ghent, MD  HYDROcodone-acetaminophen [facility]) 10-325 MG tablet Take 1 tablet by mouth every 6 (six) hours as needed. 06/02/17   Tonia Ghent, MD  HYDROmorphone (DILAUDID) 2 MG tablet Take 1 tablet (2 mg total) by mouth every 4 (four) hours as needed for severe pain (for kidney stones). 06/02/17   Tonia Ghent, MD  ibuprofen (ADVIL) 200 MG tablet Take 3 tablets (600 mg total) by mouth 2 (two) times daily as needed. 10/29/15   Tonia Ghent, MD  ondansetron (ZOFRAN ODT) 4 MG disintegrating tablet Take 1 tablet (4 mg total) by mouth every 8 (eight) hours as needed for nausea or vomiting. 02/03/17   Tonia Ghent, MD  pantoprazole (PROTONIX) 40 MG tablet TAKE ONE TABLET BY MOUTH TWICE A DAY 01/27/17   Tonia Ghent, MD  polyethylene glycol powder (GLYCOLAX/MIRALAX) powder MIX 17G IN 4 TO 8 OUNCES OF FLUID AND TAKE TWICE DAILY 02/17/15   Tonia Ghent, MD  tamsulosin (FLOMAX) 0.4 MG CAPS capsule Take 1 capsule (0.4 mg total) by mouth daily. 06/09/17  Tonia Ghent, MD  zolpidem (AMBIEN) 10 MG tablet TAKE 1 TABLET BY MOUTH AT BEDTIME AS NEEDED 04/14/17   Tonia Ghent, MD    Family History Family History  Problem Relation Age of Onset  . Stroke Mother   . Liver disease Mother   . Irritable bowel syndrome Mother   . Kidney disease Mother   . Heart disease Father   . Colon polyps Father   . Colon cancer Neg Hx     Social History Social History   Tobacco Use  . Smoking status: Never Smoker  . Smokeless tobacco: Never Used  Substance Use Topics  . Alcohol use: Not Currently    Alcohol/week: 0.0 oz    Comment: rarely  . Drug use: No      Allergies   Phenergan [promethazine hcl]; Tape; Toradol [ketorolac tromethamine]; Trileptal [oxcarbazepine]; and Wellbutrin [bupropion]   Review of Systems Review of Systems  Constitutional: Negative for fever.  Respiratory: Negative for shortness of breath.   Cardiovascular: Negative for chest pain.  Gastrointestinal: Positive for abdominal pain and nausea. Negative for vomiting.  Genitourinary: Positive for flank pain and penile pain.  Neurological: Negative for headaches.  All other systems reviewed and are negative.    Physical Exam Updated Vital Signs BP (!) 170/121 (BP Location: Right Arm)   Pulse (!) 110   Temp (!) 97.5 F (36.4 C) (Oral)   Resp 16   Ht 6' (1.829 m)   Wt (!) 153.8 kg (339 lb)   SpO2 97%   BMI 45.98 kg/m   Physical Exam  Constitutional: He is oriented to person, place, and time. He appears well-developed and well-nourished. No distress.  HENT:  Head: Normocephalic and atraumatic.  Mouth/Throat: No oropharyngeal exudate.  Eyes: Pupils are equal, round, and reactive to light. Conjunctivae are normal.  Neck: Normal range of motion. Neck supple.  Cardiovascular: Normal rate, regular rhythm, normal heart sounds and intact distal pulses.  Pulmonary/Chest: Effort normal and breath sounds normal. No stridor. He has no wheezes. He has no rales.  Abdominal: Soft. Bowel sounds are normal. He exhibits no mass. There is no tenderness. There is no rebound and no guarding.  Musculoskeletal: Normal range of motion.  Neurological: He is alert and oriented to person, place, and time. He displays normal reflexes.  Skin: Skin is warm and dry. Capillary refill takes less than 2 seconds.  Psychiatric: He has a normal mood and affect.  Nursing note and vitals reviewed.    ED Treatments / Results  Labs (all labs ordered are listed, but only abnormal results are displayed) Results for orders placed or performed during the hospital encounter of 06/15/17  CBC  with Differential/Platelet  Result Value Ref Range   WBC 10.3 4.0 - 10.5 K/uL   RBC 5.16 4.22 - 5.81 MIL/uL   Hemoglobin 16.1 13.0 - 17.0 g/dL   HCT 44.1 39.0 - 52.0 %   MCV 85.5 78.0 - 100.0 fL   MCH 31.2 26.0 - 34.0 pg   MCHC 36.5 (H) 30.0 - 36.0 g/dL   RDW 13.5 11.5 - 15.5 %   Platelets 283 150 - 400 K/uL   Neutrophils Relative % 60 %   Neutro Abs 6.2 1.7 - 7.7 K/uL   Lymphocytes Relative 25 %   Lymphs Abs 2.6 0.7 - 4.0 K/uL   Monocytes Relative 12 %   Monocytes Absolute 1.3 (H) 0.1 - 1.0 K/uL   Eosinophils Relative 3 %   Eosinophils Absolute 0.3 0.0 -  0.7 K/uL   Basophils Relative 0 %   Basophils Absolute 0.0 0.0 - 0.1 K/uL  Basic metabolic panel  Result Value Ref Range   Sodium 135 135 - 145 mmol/L   Potassium 3.6 3.5 - 5.1 mmol/L   Chloride 100 (L) 101 - 111 mmol/L   CO2 25 22 - 32 mmol/L   Glucose, Bld 119 (H) 65 - 99 mg/dL   BUN 13 6 - 20 mg/dL   Creatinine, Ser 1.24 0.61 - 1.24 mg/dL   Calcium 8.7 (L) 8.9 - 10.3 mg/dL   GFR calc non Af Amer >60 >60 mL/min   GFR calc Af Amer >60 >60 mL/min   Anion gap 10 5 - 15  Urinalysis, Routine w reflex microscopic  Result Value Ref Range   Color, Urine YELLOW YELLOW   APPearance CLEAR CLEAR   Specific Gravity, Urine 1.010 1.005 - 1.030   pH 6.0 5.0 - 8.0   Glucose, UA NEGATIVE NEGATIVE mg/dL   Hgb urine dipstick LARGE (A) NEGATIVE   Bilirubin Urine NEGATIVE NEGATIVE   Ketones, ur NEGATIVE NEGATIVE mg/dL   Protein, ur NEGATIVE NEGATIVE mg/dL   Nitrite NEGATIVE NEGATIVE   Leukocytes, UA NEGATIVE NEGATIVE  Urinalysis, Microscopic (reflex)  Result Value Ref Range   RBC / HPF 11-20 0 - 5 RBC/hpf   WBC, UA 0-5 0 - 5 WBC/hpf   Bacteria, UA NONE SEEN NONE SEEN   Squamous Epithelial / LPF 0-5 0 - 5   Ca Oxalate Crys, UA PRESENT    Dg Abdomen 1 View  Result Date: 05/16/2017 CLINICAL DATA:  Acute left flank pain. EXAM: ABDOMEN - 1 VIEW COMPARISON:  CT scan of April 16, 2017. Radiographs of April 10, 2017. FINDINGS: The bowel  gas pattern is normal. Status post cholecystectomy. Stable position of stimulator leads in lower thoracic and upper lumbar spine. No definite renal calcifications are noted. Faint density seen to left of L4 vertebral body which potentially may represent distal ureteral calculus. IMPRESSION: No evidence of bowel obstruction or ileus. Faint density seen to left of L4 vertebral body which potentially may represent distal left ureteral calculus. Electronically Signed   By: Marijo Conception, M.D.   On: 05/16/2017 09:41   US Renal  Result Date: 06/12/2017 CLINICAL DATA:  Left flank pain and hematuria EXAM: RENAL / URINARY TRACT ULTRASOUND COMPLETE COMPARISON:  Ultrasound 05/16/2017, 05/01/2017 FINDINGS: Right Kidney: Length: 12 cm. Cortical echogenicity within normal limits. Echogenic focus mid to upper pole measuring up to 7 mm. No hydronephrosis Left Kidney: Length: 13.5 cm. Cortical echogenicity within normal limits. Moderate hydronephrosis and proximal hydroureter, increased compared to 05/16/2017 ultrasound. Small echogenic focus mid left kidney 4 mm, possible small stone. Bladder: Nearly empty. IMPRESSION: 1. Moderate left hydronephrosis, increased compared to most recent prior renal ultrasound. There is proximal hydroureter. 2. Small echogenic foci within both kidneys, likely reflecting small stones Electronically Signed   By: Donavan Foil M.D.   On: 06/12/2017 22:06   US Renal  Result Date: 05/16/2017 CLINICAL DATA:  Left flank pain. EXAM: RENAL / URINARY TRACT ULTRASOUND COMPLETE COMPARISON:  Ultrasound 05/01/2017. FINDINGS: Right Kidney: Length: 12.7 cm. Echogenicity within normal limits. 8 mm nonobstructing stone. No mass or hydronephrosis visualized. Left Kidney: Length: 13.6 cm. Echogenicity within normal limits. No mass. Mild hydronephrosis, improved from prior exam. Left renal pelvis diameter is 1.1 cm down from 2.2 cm on prior exam of 05/01/2017. Bladder: Appears normal for degree of bladder  distention. IMPRESSION: 1.  Mild left-sided hydronephrosis.  Improved from prior exam. 2.  8 mm nonobstructing right renal stone again identified. Electronically Signed   By: Marcello Moores  Register   On: 05/16/2017 10:58   Ct Renal Stone Study  Result Date: 06/15/2017 CLINICAL DATA:  Left flank pain for 2 days. EXAM: CT ABDOMEN AND PELVIS WITHOUT CONTRAST TECHNIQUE: Multidetector CT imaging of the abdomen and pelvis was performed following the standard protocol without IV contrast. COMPARISON:  Multiple prior exams most recently ultrasound 06/12/2017. Most recent CT 04/16/2017 FINDINGS: Lower chest: Lung bases are clear. Hepatobiliary: Again seen hepatic steatosis. No discrete focal lesion. Clips in the gallbladder fossa postcholecystectomy. No biliary dilatation. Pancreas: No ductal dilatation or inflammation. Spleen: Normal in size without focal abnormality. Adrenals/Urinary Tract: Normal adrenal glands. Obstructing 5 x 8 mm stone in the distal left ureter with moderate proximal hydroureteronephrosis and perinephric edema. Punctate nonobstructing stones in the upper left kidney. No right hydronephrosis. Nonobstructing stone in the lower right kidney. Urinary bladder is decompressed without stone. Stomach/Bowel: Stomach is within normal limits. Appendix surgically absent. No evidence of bowel wall thickening, distention, or inflammatory changes. Vascular/Lymphatic: Normal caliber abdominal vessels. No adenopathy. Reproductive: Prostate is unremarkable. Other: Small fat containing umbilical hernia. No free air or ascites. Musculoskeletal: Spinal stimulator in place. There are no acute or suspicious osseous abnormalities. IMPRESSION: 1. Obstructing 5 x 8 mm stone in the distal left ureter with moderate hydroureteronephrosis. 2. Bilateral nonobstructing renal calculi. 3. Hepatic steatosis. Electronically Signed   By: Jeb Levering M.D.   On: 06/15/2017 03:47    Procedures Procedures (including critical care  time)  Medications Ordered in ED Medications  0.9 %  sodium chloride infusion (125 mL/hr Intravenous New Bag/Given 06/15/17 0404)  HYDROmorphone (DILAUDID) injection 1 mg (has no administration in time range)  HYDROmorphone (DILAUDID) injection 2 mg (2 mg Intravenous Given 06/15/17 0402)  ondansetron (ZOFRAN) injection 4 mg (4 mg Intravenous Given 06/15/17 0402)    PMP Aware reviewed:   06/02/2017 1 06/02/2017 Hydrocodone-Acetamin 10-325 Mg  120 30 Gr Dun [id number] Har (7333) 0 40.00 MME Comm Ins [place] 06/02/2017 1 06/02/2017 Hydromorphone 2 Mg Tablet  30 5 Gr Dun [id number] Har (7333) 0 48.00 MME Comm Ins [place] 04/27/2017 1 04/27/2017 Hydrocodone-Acetamin 10-325 Mg  120 30 Gr Dun [id number] Har (7333) 0 40.00 MME Comm Ins [place] 04/27/2017 1 04/27/2017 Hydromorphone 2 Mg Tablet  30 5 Gr Dun [id number] Har (7333) 0 48.00 MME Comm Ins [place] 04/14/2017 3 04/14/2017 Zolpidem Tartrate 10 Mg Tablet  90 90 Gr Dun [id number] Cos (4291) 0 0.50 LME Other [place] 03/25/2017 1 03/23/2017 Hydrocodone-Acetamin 10-325 Mg  120 30 Gr Dun [id number] Har (7333) 0 40.00 MME Comm Ins [place] 03/25/2017 1 03/23/2017 Hydromorphone 2 Mg Tablet  30 5 Gr Dun [id number] Har (7333) 0 48.00 MME Comm Ins [place]   Final Clinical Impressions(s) / ED Diagnoses   Nephrolithiasis with failure to progress since early [place] not pain free on IV narcotics or home narcotics  Case d/w Dr. Diona Fanti or urology please send to [facility] ED  Case d/w Dr. Florina Ou who is aware of transfer   NPO since 4 pm      Jerolene Kupfer, MD 06/15/17 [phone]

## 2017-06-15 NOTE — Anesthesia Postprocedure Evaluation (Signed)
Anesthesia Post Note  Patient: Casey Caldwell  Procedure(s) Performed: CYSTOSCOPY/LEFT URETEROSCOPYSTONE EXTRACTION/LEFT RETROGRADE (Left )     Patient location during evaluation: PACU Anesthesia Type: General Level of consciousness: sedated and patient cooperative Pain management: pain level controlled Vital Signs Assessment: post-procedure vital signs reviewed and stable Respiratory status: spontaneous breathing Cardiovascular status: stable Anesthetic complications: no    Last Vitals:  Vitals:   06/15/17 1145 06/15/17 1200  BP: (!) 135/118 135/89  Pulse: 84 71  Resp: 14 11  Temp:    SpO2: 99% 99%    Last Pain:  Vitals:   06/15/17 1200  TempSrc:   PainSc: [name]

## 2017-06-16 ENCOUNTER — Encounter: Payer: Self-pay | Admitting: Family Medicine

## 2017-06-21 MED FILL — Ondansetron HCl Inj 4 MG/2ML (2 MG/ML): INTRAMUSCULAR | Qty: 2 | Status: AC

## 2017-06-22 ENCOUNTER — Encounter: Payer: Self-pay | Admitting: Family Medicine

## 2017-06-25 ENCOUNTER — Other Ambulatory Visit: Payer: Self-pay | Admitting: Family Medicine

## 2017-06-25 MED ORDER — HYDROCODONE-ACETAMINOPHEN 10-325 MG PO TABS: 1.0000 | ORAL_TABLET | Freq: Four times a day (QID) | ORAL | 0 refills | Status: DC | PRN

## 2017-06-25 MED ORDER — HYDROMORPHONE HCL 2 MG PO TABS: 2.0000 mg | ORAL_TABLET | ORAL | 0 refills | Status: DC | PRN

## 2017-07-07 ENCOUNTER — Other Ambulatory Visit: Payer: Self-pay | Admitting: Family Medicine

## 2017-07-07 NOTE — Telephone Encounter (Signed)
Electronic refill request. Zolpidem Last office visit:   06/09/17 Last Filled:    90 tablet 0 04/14/2017  Please advise.

## 2017-07-08 NOTE — Telephone Encounter (Signed)
Sent. Thanks.   

## 2017-07-24 ENCOUNTER — Other Ambulatory Visit: Payer: Self-pay | Admitting: Family Medicine

## 2017-07-24 NOTE — Telephone Encounter (Signed)
Name of Medication: Hydromorphone 2 mg q 4hrs prn Name of Pharmacy: [facility] or Written Date and Quantity: 06/25/17, #30, 0 refills Last Office Visit and Type: 06/09/17 Chronic pain management visit Next Office Visit and Type:  No OV on schedule at this time/  Needs to schedule his 66mth chronic pain med appt Last Controlled Substance Agreement Date: 03/04/16 Last UDS: 03/04/16 PAST DUE FOR YEARLY UDS   Name of Medication: Hydrocodone- Acetaminophen 10-325mg  1 po q6hrs prn Name of Pharmacy:  [facility] or Written Date and Quantity: 06/25/17 #120, 0 refills Last Office Visit and Type: 06/09/17 Chronic pain management visit Next Office Visit and Type: No OV on schedule at this time/  Needs to schedule his 70mth chronic med mgmt appt Last Controlled Substance Agreement Date: 03/04/16 Last UDS: 03/04/16  PAST DUE FOR YEARLY UDS  WILL PEND FOR DR. Damita Dunnings

## 2017-07-24 NOTE — Telephone Encounter (Signed)
Copied from [place] [id number]. Topic: Quick Communication - Rx Refill/Question >> Jul 24, 2017  9:59 AM Lennox Solders wrote: Medication:hydromophone and hydrocodone Has the patient contacted their pharmacy?  No (Agent: If no, request that the patient contact the pharmacy for the refill.) (Agent: If yes, when and what did the pharmacy advise?)  Preferred Pharmacy (with phone number or street name): [facility] Agent: Please be advised that RX refills may take up to 3 business days. We ask that you follow-up with your pharmacy.

## 2017-07-25 MED ORDER — HYDROCODONE-ACETAMINOPHEN 10-325 MG PO TABS: 1.0000 | ORAL_TABLET | Freq: Four times a day (QID) | ORAL | 0 refills | Status: DC | PRN

## 2017-07-25 MED ORDER — HYDROMORPHONE HCL 2 MG PO TABS: 2.0000 mg | ORAL_TABLET | ORAL | 0 refills | Status: DC | PRN

## 2017-07-25 NOTE — Telephone Encounter (Signed)
Schedule appointment as instructed.

## 2017-07-25 NOTE — Telephone Encounter (Signed)
Both sent.  Needs pain f/u OV scheduled for next month with UDS/contract update at the visit.  Thanks.

## 2017-07-27 ENCOUNTER — Other Ambulatory Visit: Payer: Self-pay | Admitting: Family Medicine

## 2017-07-31 NOTE — Telephone Encounter (Signed)
appt made to follow Catalina Antigua, RMA

## 2017-08-09 ENCOUNTER — Ambulatory Visit: Payer: Self-pay | Admitting: Family Medicine

## 2017-08-09 ENCOUNTER — Encounter: Payer: Self-pay | Admitting: Family Medicine

## 2017-08-09 VITALS — BP 128/78 | HR 91 | Temp 98.5°F | Resp 16 | Ht 72.0 in | Wt 343.0 lb

## 2017-08-09 DIAGNOSIS — M722 Plantar fascial fibromatosis: Secondary | ICD-10-CM

## 2017-08-09 DIAGNOSIS — G894 Chronic pain syndrome: Secondary | ICD-10-CM

## 2017-08-09 NOTE — Progress Notes (Signed)
Indication for chronic opioid: Chronic back and groin and leg pain, with spinal cord stimulator placed previously. Medication and dose:  Hydromorphone 2 mg every 4 hours as needed for kidney stones, 30 per month. Hydrocodone 10/325, every 6 hours as needed for pain. 120 per month # pills per month: See above Last UDS date: 08/09/17 Pain contract signed (Y/N): yes  Date narcotic database last reviewed (include red flags): 08/09/17   Pain inventory (1-10) Average pain: 5/10 Pain now: 5/10 My pain is sharp and stabbing with standing but dull constant o/w Pain is worse with moving/changing positions.   Relief from meds: yes, some  In the last 24 hours, how much has pain interfered with the following (1-10 greatest interference)? General activity "a good amount, I'd be more active it I wasn't in pain."  Relationships with others- discussed his situation with his wife Enjoyment of life- limited by pain, d/w pt.  What time of the day is the pain the worst- with position changes.   Sleep is described by patient as "okay, sleep isn't not really in issue."  Taking melatonin with ambien with relief.   Mobility/function Assistance device: no How many minutes can you walk: limited by pain.  Able to climb steps: yes Driving: yes Disabled: has missed work but not formally disabled.    Bowel or bladder symptoms: constipation is managed with diet.  D/w pt.   Mood: He is trying to work through the loss of his father.  D/w pt.    Physicians involved in care: no others currently  Any changes since last visit? No   He will be checking with the pain clinic about the stimulator.  When the battery ran down the pain clearly changed (much more groin pain) and then responded to recharging.    D/w pt about his work situation.  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  He had more numbness in the L lateral leg.  He is going to get massages and hydrotherapy that helped some  with back and leg pain.  He has had B arch pain with pain starting with 1st step in the AM.  Had been wearing flip flops a lot recently.  He has been trying to walk more in the meantime but that was limited by foot/leg/back pain.  Meds, vitals, and allergies reviewed.   ROS: Per HPI unless specifically indicated in ROS section   GEN: nad, alert and oriented HEENT: mucous membranes moist NECK: supple w/o LA CV: rrr PULM: ctab, no inc wob ABD: soft, +bs EXT: no edema SKIN: no acute rash Lower midline and bilateral back tender to palpation without rash.  Able to bear weight but he does have left lateral thigh numbness on palpation.  Skin is well-perfused in the lower extremities but he has bilateral plantar fascia tenderness, especially at the origin and midportion of the bilateral plantar fascia.  Lateral and medial malleoli are not tender.  Achilles and calcaneus nontender in the bilateral feet.

## 2017-08-09 NOTE — Patient Instructions (Signed)
Go to the lab on the way out.  We'll contact you with your lab report. Don't change your meds for now.  Use the plantar fasciitis stretches.   Keep working with massage and hydrotherapy.  Take care.  Glad to see you.  Plan on recheck in about 3 months, similar to today.  Sooner if needed.

## 2017-08-13 LAB — PAIN MGMT, PROFILE 8 W/CONF, U
6 Acetylmorphine: NEGATIVE ng/mL (ref <10)
Alcohol Metabolites: NEGATIVE ng/mL (ref <500)
Amphetamines: NEGATIVE ng/mL (ref <500)
Benzodiazepines: NEGATIVE ng/mL (ref <100)
Buprenorphine, Urine: NEGATIVE ng/mL (ref <5)
Cocaine Metabolite: NEGATIVE ng/mL (ref <150)
Codeine: NEGATIVE ng/mL (ref <50)
Creatinine: 205.8 mg/dL
Hydrocodone: 408 ng/mL — ABNORMAL HIGH (ref <50)
Hydromorphone: 552 ng/mL — ABNORMAL HIGH (ref <50)
MDMA: NEGATIVE ng/mL (ref <500)
Marijuana Metabolite: NEGATIVE ng/mL (ref <20)
Morphine: NEGATIVE ng/mL (ref <50)
Norhydrocodone: 1918 ng/mL — ABNORMAL HIGH (ref <50)
Opiates: POSITIVE ng/mL — AB (ref <100)
Oxidant: NEGATIVE ug/mL (ref <200)
Oxycodone: NEGATIVE ng/mL (ref <100)
pH: 6.91 (ref 4.5–9.0)

## 2017-08-14 DIAGNOSIS — M722 Plantar fascial fibromatosis: Secondary | ICD-10-CM | POA: Insufficient documentation

## 2017-08-14 NOTE — Assessment & Plan Note (Signed)
Handout given.  Discussed.  Questions answered.  Discussed with patient about routine stretches and anatomy and rationale for treatment.  He can use home exercise program and home stretching program and update me as needed.
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
## 2017-08-24 ENCOUNTER — Other Ambulatory Visit: Payer: Self-pay | Admitting: Family Medicine

## 2017-08-24 DIAGNOSIS — G8929 Other chronic pain: Secondary | ICD-10-CM

## 2017-08-24 NOTE — Telephone Encounter (Signed)
LOV 08/09/17 Dr. Damita Dunnings Last refill 07/25/17 # 30 with 0 refill Dilaudid Last refill 07/25/17 # 120 with 0 refill Norco

## 2017-08-24 NOTE — Telephone Encounter (Signed)
Copied from [place] [id number]. Topic: Quick Communication - Rx Refill/Question >> Aug 24, 2017  1:01 PM Synthia Innocent wrote: Medication: HYDROcodone-acetaminophen (NORCO) 10-325 MG tablet and HYDROmorphone (DILAUDID) 2 MG tablet   Has the patient contacted their pharmacy? Yes.   (Agent: If no, request that the patient contact the pharmacy for the refill.) (Agent: If yes, when and what did the pharmacy advise?)  Preferred Pharmacy (with phone number or street name): [facility]  Agent: Please be advised that RX refills may take up to 3 business days. We ask that you follow-up with your pharmacy.

## 2017-08-26 MED ORDER — HYDROCODONE-ACETAMINOPHEN 10-325 MG PO TABS: 1.0000 | ORAL_TABLET | Freq: Four times a day (QID) | ORAL | 0 refills | Status: DC | PRN

## 2017-08-26 MED ORDER — HYDROMORPHONE HCL 2 MG PO TABS: 2.0000 mg | ORAL_TABLET | ORAL | 0 refills | Status: DC | PRN

## 2017-08-26 NOTE — Telephone Encounter (Signed)
Indication for chronic opioid: Chronic back and groin and leg pain, with spinal cord stimulator placed previously. Medication and dose:  Hydromorphone 2 mg every 4 hours as needed for kidney stones, 30 per month. Hydrocodone 10/325, every 6 hours as needed for pain. 120 per month # pills per month: See above Last UDS date: 03/04/16.  Pain contract signed (Y/N): yes  Date narcotic database last reviewed (include red flags): 08/26/17   Sent. Thanks.

## 2017-09-01 ENCOUNTER — Encounter: Payer: Self-pay | Admitting: Internal Medicine

## 2017-09-28 ENCOUNTER — Telehealth: Payer: Self-pay | Admitting: Family Medicine

## 2017-09-28 NOTE — Telephone Encounter (Signed)
Electronic refill request. Zolpidem Last office visit:   08/09/17 Last Filled:    90 tablet 0 07/08/2017  Please advise.

## 2017-10-01 NOTE — Telephone Encounter (Signed)
Sent. Thanks.   

## 2017-10-02 ENCOUNTER — Other Ambulatory Visit: Payer: Self-pay | Admitting: Family Medicine

## 2017-10-02 MED ORDER — ZOLPIDEM TARTRATE 10 MG PO TABS: 10.0000 mg | ORAL_TABLET | Freq: Every evening | ORAL | 0 refills | Status: DC | PRN

## 2017-10-02 NOTE — Telephone Encounter (Signed)
There was notification that this did not go through to the pharmacy.  Can you re-send?

## 2017-10-02 NOTE — Addendum Note (Signed)
Addended by: Tonia Ghent on: 10/02/2017 11:47 PM   Modules accepted: Orders

## 2017-10-02 NOTE — Telephone Encounter (Signed)
Copied from [place] [id number]. Topic: Quick Communication - See Telephone Encounter >> Oct 02, 2017  3:09 PM Vernona Rieger wrote: CRM for notification. See Telephone encounter for: 10/02/17.  HYDROcodone-acetaminophen (NORCO) 10-325 MG tablet  HYDROmorphone (DILAUDID) 2 MG tablet  [facility] [place], [place] [address]

## 2017-10-02 NOTE — Telephone Encounter (Signed)
Sent. Thanks.   

## 2017-10-03 NOTE — Telephone Encounter (Signed)
Name of Medication: Hydrocodone apap 10-325 Name of Pharmacy: [facility] or Written Date and Quantity: # 120 on 08/26/17 Last Office Visit and Type: 08/09/17 FU pain mgt Next Office Visit and Type: none Last Controlled Substance Agreement Date:08/14/17  Last UDS:08/09/17   Name of Medication: Hydromorphone 2 mg Name of Pharmacy: [facility] or Written Date and Quantity: # 30 on 08/26/17 Last Office Visit and Type: 08/09/17 FU pain mgt Next Office Visit and Type: none Last Controlled Substance Agreement Date:08/14/17  Last UDS:08/09/17

## 2017-10-03 NOTE — Telephone Encounter (Signed)
Refill of Norco  LRF 08/26/17  #120  0 refills   Refill of Dilaudid  LRF 08/26/17  #30  0 refills   LOV 08/09/17 Dr. Aurelio Jew at [place], [place] [address] [place] [address]

## 2017-10-04 MED ORDER — HYDROMORPHONE HCL 2 MG PO TABS: 2.0000 mg | ORAL_TABLET | ORAL | 0 refills | Status: DC | PRN

## 2017-10-04 MED ORDER — HYDROCODONE-ACETAMINOPHEN 10-325 MG PO TABS: 1.0000 | ORAL_TABLET | Freq: Four times a day (QID) | ORAL | 0 refills | Status: DC | PRN

## 2017-10-04 NOTE — Telephone Encounter (Signed)
Sent. Thanks.   

## 2017-11-03 ENCOUNTER — Other Ambulatory Visit: Payer: Self-pay | Admitting: Family Medicine

## 2017-11-03 NOTE — Telephone Encounter (Signed)
Copied from [place] [id number]. Topic: Quick Communication - Rx Refill/Question >> Nov 03, 2017  3:37 PM Keene Breath wrote: Medication: HYDROcodone-acetaminophen (NORCO) 10-325 MG tablet / HYDROmorphone (DILAUDID) 2 MG tablet  Patient called to request a refill for the above medication.  CB# [phone]  Preferred Pharmacy:  [facility] [place], [place] - [place] [phone] (Phone) [phone] (Fax)

## 2017-11-03 NOTE — Telephone Encounter (Signed)
Name of Medication: Hydrocodone apap 10-325 mg Name of Pharmacy:[facility]  Last [facility] or Written Date and Quantity: # 120 on 10/04/17 Last Office Visit and Type: 08/09/17 pain mgt Next Office Visit and Type: none scheduled Last Controlled Substance Agreement Date: 08/14/17 Last UDS:08/09/17   Name of Medication: Hydromorphone 2 mg Name of Pharmacy:[facility] or Written Date and Quantity: # 30 on 10/04/17 Last Office Visit and Type: 08/09/17 pain mgt Next Office Visit and Type: none scheduled Last Controlled Substance Agreement Date: 08/14/17 Last UDS:08/09/17

## 2017-11-03 NOTE — Telephone Encounter (Signed)
Refill request

## 2017-11-05 MED ORDER — HYDROCODONE-ACETAMINOPHEN 10-325 MG PO TABS: 1.0000 | ORAL_TABLET | Freq: Four times a day (QID) | ORAL | 0 refills | Status: DC | PRN

## 2017-11-05 MED ORDER — HYDROMORPHONE HCL 2 MG PO TABS: 2.0000 mg | ORAL_TABLET | ORAL | 0 refills | Status: DC | PRN

## 2017-11-05 NOTE — Telephone Encounter (Signed)
Sent.  Needs 3 months routine pain f/u in the next 30 days.  Thanks.

## 2017-11-06 ENCOUNTER — Other Ambulatory Visit: Payer: Self-pay | Admitting: Family Medicine

## 2017-11-06 NOTE — Telephone Encounter (Signed)
Electronic refill request Last office visit 08/09/17 Last refill 07/28/17 #90

## 2017-11-06 NOTE — Telephone Encounter (Signed)
Please call patient and schedule appointment as instructed. 

## 2017-11-06 NOTE — Telephone Encounter (Signed)
Left message for patient to call back and schedule-Jamez Ambrocio V Jared Whorley, RMA

## 2017-11-06 NOTE — Telephone Encounter (Signed)
Spoke with patient. He states that he will call back when he knows of a day he can come in due to his schedule. Patient is aware appointment is needed before more refills needed-Casey Caldwell V Bruce Mayers, RMA

## 2017-11-07 NOTE — Telephone Encounter (Signed)
Sent. Thanks.   

## 2017-12-04 ENCOUNTER — Ambulatory Visit (INDEPENDENT_AMBULATORY_CARE_PROVIDER_SITE_OTHER): Payer: Self-pay | Admitting: Family Medicine

## 2017-12-04 ENCOUNTER — Encounter: Payer: Self-pay | Admitting: Family Medicine

## 2017-12-04 VITALS — BP 146/108 | HR 97 | Temp 98.7°F | Ht 72.0 in | Wt 346.2 lb

## 2017-12-04 DIAGNOSIS — F329 Major depressive disorder, single episode, unspecified: Secondary | ICD-10-CM

## 2017-12-04 DIAGNOSIS — F419 Anxiety disorder, unspecified: Secondary | ICD-10-CM

## 2017-12-04 DIAGNOSIS — G8929 Other chronic pain: Secondary | ICD-10-CM

## 2017-12-04 DIAGNOSIS — Z23 Encounter for immunization: Secondary | ICD-10-CM

## 2017-12-04 DIAGNOSIS — G4700 Insomnia, unspecified: Secondary | ICD-10-CM

## 2017-12-04 DIAGNOSIS — F32A Depression, unspecified: Secondary | ICD-10-CM

## 2017-12-04 MED ORDER — HYDROMORPHONE HCL 2 MG PO TABS: 2.0000 mg | ORAL_TABLET | ORAL | 0 refills | Status: DC | PRN

## 2017-12-04 MED ORDER — HYDROCODONE-ACETAMINOPHEN 10-325 MG PO TABS: 1.0000 | ORAL_TABLET | Freq: Four times a day (QID) | ORAL | 0 refills | Status: DC | PRN

## 2017-12-04 MED ORDER — ARIPIPRAZOLE 5 MG PO TABS
2.5000 mg | ORAL_TABLET | Freq: Every day | ORAL | 0 refills | Status: DC
Start: 2017-12-04 — End: 2018-02-08
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
## 2017-12-04 NOTE — Patient Instructions (Addendum)
Try to stretch your pain meds as your pain allows.  Keep stretching.   Goal to slowly start weaning down on abilify in early 2020, likely by cutting a dose per week.    Take care.  Glad to see you.  Update me as needed.  Plan on recheck 01/2018.

## 2017-12-05 NOTE — Assessment & Plan Note (Addendum)
Indication for chronic opioid: Chronic back and groin and leg pain, with spinal cord stimulator placed previously. Medication and dose: Hydromorphone 2 mg every 4 hours as needed for kidney stones, 30 per month. Hydrocodone 10/325, every 6 hours as needed for pain. 120 per month # pills per month: See above Last UDS date: 08/09/17 Pain contract signed (Y/N): yes  Date narcotic database last reviewed (include red flags): 12/04/17.    He is improved compared to previous.  His groin pain is significantly better.  It appears that the spinal cord stimulator is helping a lot with that.  He is going to change his insurance in January so that he will be able to follow-up with the pain clinic.  Discussed. He is going to try to gradually wean down on his opiates and stretches prescription.  No adverse effect on medication.  Reasonable to continue as is for now otherwise.  Discussed. He has prescription for Dilaudid in case he has more pain related to renal stones.  He is going to check on follow-up with urology.  No recent blood in urine.  He is going to keep stretching.  He is checking on getting set up at a pool so he can exercise there.
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
## 2017-12-05 NOTE — Assessment & Plan Note (Signed)
He is using Ambien as needed.  He is tried to go some nights without use.  No adverse effect.  Reasonable to continue as is for now.

## 2017-12-27 ENCOUNTER — Other Ambulatory Visit: Payer: Self-pay | Admitting: *Deleted

## 2017-12-27 NOTE — Telephone Encounter (Signed)
Last office visit 12/04/2017 for chronic pain management.  Last refilled 10/02/2017 for #90 with no refills.  UDS/Contract 08/09/2017.  No future appointments.

## 2017-12-28 MED ORDER — ZOLPIDEM TARTRATE 10 MG PO TABS: 10.0000 mg | ORAL_TABLET | Freq: Every evening | ORAL | 0 refills | Status: DC | PRN

## 2017-12-28 NOTE — Telephone Encounter (Signed)
Sent. Thanks.  Okay to continue. 

## 2018-01-10 ENCOUNTER — Other Ambulatory Visit: Payer: Self-pay | Admitting: *Deleted

## 2018-01-10 MED ORDER — HYDROMORPHONE HCL 2 MG PO TABS: 2.0000 mg | ORAL_TABLET | ORAL | 0 refills | Status: DC | PRN

## 2018-01-10 MED ORDER — HYDROCODONE-ACETAMINOPHEN 10-325 MG PO TABS: 1.0000 | ORAL_TABLET | Freq: Four times a day (QID) | ORAL | 0 refills | Status: DC | PRN

## 2018-01-10 NOTE — Telephone Encounter (Signed)
Sent. Thanks.   

## 2018-01-10 NOTE — Telephone Encounter (Signed)
Name of Medication: Dilaudid and Hydrocodone Name of Pharmacy: [facility] or Written Date and Quantity: Dilaudid 12/04/17 #30 Hydrocodone 12/04/17 #120 Last Office Visit and Type: 12/04/17 Next Office Visit and Type: None Last Controlled Substance Agreement Date: 03/04/16 Last UDS: 08/09/17

## 2018-02-08 ENCOUNTER — Telehealth: Payer: Self-pay

## 2018-02-08 ENCOUNTER — Encounter: Payer: Self-pay | Admitting: Family Medicine

## 2018-02-08 ENCOUNTER — Ambulatory Visit (INDEPENDENT_AMBULATORY_CARE_PROVIDER_SITE_OTHER): Payer: BLUE CROSS/BLUE SHIELD | Admitting: Family Medicine

## 2018-02-08 DIAGNOSIS — G894 Chronic pain syndrome: Secondary | ICD-10-CM | POA: Diagnosis not present

## 2018-02-08 MED ORDER — PANTOPRAZOLE SODIUM 40 MG PO TBEC
40.0000 mg | DELAYED_RELEASE_TABLET | Freq: Two times a day (BID) | ORAL | 1 refills | Status: DC
Start: 2018-02-08 — End: 2019-04-10

## 2018-02-08 MED ORDER — ONDANSETRON 4 MG PO TBDP: 4.0000 mg | ORAL_TABLET | Freq: Three times a day (TID) | ORAL | 3 refills | Status: DC | PRN

## 2018-02-08 MED ORDER — HYDROMORPHONE HCL 2 MG PO TABS: 2.0000 mg | ORAL_TABLET | ORAL | 0 refills | Status: DC | PRN

## 2018-02-08 MED ORDER — HYDROCODONE-ACETAMINOPHEN 5-325 MG PO TABS: 1.0000 | ORAL_TABLET | Freq: Four times a day (QID) | ORAL | 0 refills | Status: DC | PRN

## 2018-02-08 MED ORDER — POLYETHYLENE GLYCOL 3350 17 GM/SCOOP PO POWD: ORAL | 1 refills | Status: DC

## 2018-02-08 MED ORDER — ESCITALOPRAM OXALATE 20 MG PO TABS: 20.0000 mg | ORAL_TABLET | Freq: Every day | ORAL | 3 refills | Status: DC

## 2018-02-08 MED ORDER — ARIPIPRAZOLE 5 MG PO TABS: 2.5000 mg | ORAL_TABLET | Freq: Every day | ORAL | 3 refills | Status: DC

## 2018-02-08 MED ORDER — TAMSULOSIN HCL 0.4 MG PO CAPS: 0.4000 mg | ORAL_CAPSULE | Freq: Every day | ORAL | 3 refills | Status: DC
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
## 2018-02-08 NOTE — Patient Instructions (Signed)
Have a safe trip.  Cut back to ~35mg  hydrocodone a day.  Follow up with urology.   I'll await your follow up with the pain clinic.  Take care.  Glad to see you.  Let me see about sleep options.

## 2018-02-08 NOTE — Telephone Encounter (Signed)
Casey Caldwell with [facility] left v/m requesting a cb on possible drug interaction between Lexapro and zofran.Please advise.

## 2018-02-08 NOTE — Telephone Encounter (Signed)
Called pharmacy.  He has tolerated prev w/o ADE.  Other meds for nausea may be riskier at this point.  Pharmacy agreed, thanked me for the call.

## 2018-02-11 NOTE — Assessment & Plan Note (Addendum)
Indication for chronic opioid: Chronic back and groin and leg pain, with spinal cord stimulator placed previously. Medication and dose: Hydromorphone 2 mg every 4 hours as needed for kidney stones, 30 per month. Hydrocodone 5/325, 1-2 every 6 hours as needed for pain.  #210 per month # pills per month: See above Last UDS date: 08/09/17 Pain contract signed (Y/N): yes  Date narcotic database last reviewed (include red flags): 12/04/17.    He is optimistic about his situation.  He is on average taking 40 mg of hydrocodone per day over the last few months.  We will try to wean down to 35 mg/day.  Change to 5 mg tabs taking 1-2 tabs at a time.  Goal to average 7 tabs per day or less.  There is no reason to decrease his daily dose very quickly.  Continue using Dilaudid if he has pain from renal stones.  He has been able to avoid emergency room visits in the meantime.  He will check on follow-up with urology.  As soon as his finances allow he will follow-up with the pain clinic, discussed.  He still having difficulty with sleep and I want to consider options about that.  Seen follow-up notes.  His mood is reasonable and it is reasonable to continue his current antidepressants.  He agrees.  I will only change one medication at a time.

## 2018-02-15 ENCOUNTER — Telehealth: Payer: Self-pay | Admitting: Family Medicine

## 2018-02-15 NOTE — Telephone Encounter (Signed)
Patient wanted me to check on options re: sleep. I don't see good options to add onto current meds.  Any changes would involve tapering off ambien slowly in the meantime and then trying another agent.  That is a reasonable option if he can tolerate it, but I wasn't enthused about changing two meds at the same time (he is tapering down on opiates).  If he is able to taper down on ambien use and still having trouble then let me know. O/w I would continue as planned for now. Thanks.

## 2018-02-15 NOTE — Telephone Encounter (Signed)
Patient advised.

## 2018-02-21 ENCOUNTER — Other Ambulatory Visit: Payer: Self-pay

## 2018-02-21 ENCOUNTER — Emergency Department (HOSPITAL_BASED_OUTPATIENT_CLINIC_OR_DEPARTMENT_OTHER)
Admission: EM | Admit: 2018-02-21 | Discharge: 2018-02-21 | Disposition: A | Discharge status: Home / Self Care | Payer: BLUE CROSS/BLUE SHIELD | Attending: Emergency Medicine | Admitting: Emergency Medicine

## 2018-02-21 ENCOUNTER — Encounter (HOSPITAL_BASED_OUTPATIENT_CLINIC_OR_DEPARTMENT_OTHER): Payer: Self-pay | Admitting: *Deleted

## 2018-02-21 ENCOUNTER — Emergency Department (HOSPITAL_BASED_OUTPATIENT_CLINIC_OR_DEPARTMENT_OTHER): Payer: BLUE CROSS/BLUE SHIELD

## 2018-02-21 DIAGNOSIS — R1032 Left lower quadrant pain: Secondary | ICD-10-CM | POA: Diagnosis not present

## 2018-02-21 DIAGNOSIS — R197 Diarrhea, unspecified: Secondary | ICD-10-CM | POA: Diagnosis not present

## 2018-02-21 DIAGNOSIS — R109 Unspecified abdominal pain: Secondary | ICD-10-CM | POA: Insufficient documentation

## 2018-02-21 DIAGNOSIS — R112 Nausea with vomiting, unspecified: Secondary | ICD-10-CM | POA: Insufficient documentation

## 2018-02-21 LAB — COMPREHENSIVE METABOLIC PANEL
ALT: 63 U/L — ABNORMAL HIGH (ref 0–44)
AST: 43 U/L — ABNORMAL HIGH (ref 15–41)
Albumin: 4 g/dL (ref 3.5–5.0)
Alkaline Phosphatase: 80 U/L (ref 38–126)
Anion gap: 7 (ref 5–15)
BUN: 11 mg/dL (ref 6–20)
CO2: 26 mmol/L (ref 22–32)
Calcium: 8.4 mg/dL — ABNORMAL LOW (ref 8.9–10.3)
Chloride: 106 mmol/L (ref 98–111)
Creatinine, Ser: 0.85 mg/dL (ref 0.61–1.24)
GFR calc Af Amer: >60 mL/min (ref >60)
GFR calc non Af Amer: >60 mL/min (ref >60)
Glucose, Bld: 115 mg/dL — ABNORMAL HIGH (ref 70–99)
Potassium: 3.9 mmol/L (ref 3.5–5.1)
Sodium: 139 mmol/L (ref 135–145)
Total Bilirubin: 0.5 mg/dL (ref 0.3–1.2)
Total Protein: 6.9 g/dL (ref 6.5–8.1)

## 2018-02-21 LAB — URINALYSIS, ROUTINE W REFLEX MICROSCOPIC
Bilirubin Urine: NEGATIVE
Glucose, UA: NEGATIVE mg/dL
Hgb urine dipstick: NEGATIVE
Ketones, ur: NEGATIVE mg/dL
Leukocytes, UA: NEGATIVE
Nitrite: NEGATIVE
Protein, ur: NEGATIVE mg/dL
Specific Gravity, Urine: 1.025 (ref 1.005–1.030)
pH: 5.5 (ref 5.0–8.0)

## 2018-02-21 LAB — CBC WITH DIFFERENTIAL/PLATELET
Abs Immature Granulocytes: 0.04 10*3/uL (ref 0.00–0.07)
Basophils Absolute: 0 10*3/uL (ref 0.0–0.1)
Basophils Relative: 0 %
Eosinophils Absolute: 0.2 10*3/uL (ref 0.0–0.5)
Eosinophils Relative: 2 %
HCT: 52.7 % — ABNORMAL HIGH (ref 39.0–52.0)
Hemoglobin: 17.1 g/dL — ABNORMAL HIGH (ref 13.0–17.0)
Immature Granulocytes: 0 %
Lymphocytes Relative: 24 %
Lymphs Abs: 2.2 10*3/uL (ref 0.7–4.0)
MCH: 29.1 pg (ref 26.0–34.0)
MCHC: 32.4 g/dL (ref 30.0–36.0)
MCV: 89.8 fL (ref 80.0–100.0)
Monocytes Absolute: 0.9 10*3/uL (ref 0.1–1.0)
Monocytes Relative: 9 %
Neutro Abs: 6 10*3/uL (ref 1.7–7.7)
Neutrophils Relative %: 65 %
Platelets: 305 10*3/uL (ref 150–400)
RBC: 5.87 MIL/uL — ABNORMAL HIGH (ref 4.22–5.81)
RDW: 13.4 % (ref 11.5–15.5)
WBC: 9.3 10*3/uL (ref 4.0–10.5)
nRBC: 0 % (ref 0.0–0.2)

## 2018-02-21 MED ORDER — HYDROMORPHONE HCL 1 MG/ML IJ SOLN
2.0000 mg | Freq: Once | INTRAMUSCULAR | Status: AC
  Administered 2018-02-21: 2 mg via INTRAVENOUS
  Filled 2018-02-21: qty 2

## 2018-02-21 MED ORDER — SODIUM CHLORIDE 0.9 % IV BOLUS
1000.0000 mL | Freq: Once | INTRAVENOUS | Status: AC
  Administered 2018-02-21: 1000 mL via INTRAVENOUS

## 2018-02-21 NOTE — ED Triage Notes (Signed)
Pt c/o bil flank pain x 1 week , HX stones

## 2018-02-21 NOTE — ED Notes (Signed)
Pt verbalizes understanding of d/c instructions and denies any further needs at this time. 

## 2018-02-21 NOTE — ED Notes (Signed)
Pt lying on bed playing on phone, sent to restroom to obtain urine sample

## 2018-02-21 NOTE — ED Notes (Signed)
Patient transported to CT 

## 2018-02-21 NOTE — ED Provider Notes (Signed)
[place] EMERGENCY DEPARTMENT Provider Note   CSN: [id number] Arrival date & time: 02/21/18  1412     History   Chief Complaint Chief Complaint  Patient presents with  . Flank Pain    HPI Casey Caldwell is a 38 y.o. male.  HPI 38 year old male past medical history significant for kidney stones, seizures, GERD, depression, chronic pain on hydrocodone hydromorphone at home presents to the emergency department today for evaluation of bilateral flank pain and left groin pain.  Started 1-week ago.  Pain constant.  Sharp in nature.  Radiates from bilateral flanks to his left groin.  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  Patient has been taking his hydrocodone at home.  Does have Dilaudid p.o. however he has not taken this today.  Patient reports nausea.  Reports some diarrhea as well.  Denies any fevers or chills.  Denies any urinary symptoms.  Patient denies any testicular swelling or right testicular pain.  History of chronic kidney stones and is followed by urology closely.  Patient has been seen several times in the ED for similar symptoms.  He does have a history of chronic pain and is seen by a chronic pain management.  Nothing makes symptoms better or worse.  Pain is 8/10. Past Medical History:  Diagnosis Date  . Depression    and panic/anxiety  . GERD (gastroesophageal reflux disease)   . History of kidney stones   . Insomnia   . Kidney stones   . Seizures ([place]) Childhood    Patient Active Problem List   Diagnosis Date Noted  . Plantar fasciitis 08/14/2017  . Encounter for chronic pain management 01/28/2016  . Chronic pain syndrome 10/30/2015  . Adenomatous colon polyp 06/06/2012  . Nephrolithiasis 03/14/2012  . Polycythemia 03/14/2012  . Groin pain 03/14/2012  . RUQ pain 03/14/2012  . Insomnia 03/14/2012  . Seizure disorder ([place]) 03/14/2012  . Anxiety and depression 03/14/2012    Past Surgical History:  Procedure Laterality  Date  . APPENDECTOMY  2011  . CHOLECYSTECTOMY, LAPAROSCOPIC    . CYSTOSCOPY/URETEROSCOPY/HOLMIUM LASER Left 06/15/2017   Procedure: CYSTOSCOPY/LEFT URETEROSCOPYSTONE EXTRACTION/LEFT RETROGRADE;  Surgeon: Franchot Gallo, MD;  Location: [facility];  Service: Urology;  Laterality: Left;  With STENT  . HYDROCELE EXCISION Left 06/2012  . LAPAROSCOPIC CHOLECYSTECTOMY  1996  . LITHOTRIPSY     four times over the years; most recent 10/08/12  . ORCHIECTOMY Left 11/2012  . SPINAL CORD STIMULATOR INSERTION  2015  . TONSILLECTOMY  1992  . WRIST GANGLION EXCISION  2002   Right        Home Medications    Prior to Admission medications   Medication Sig Start Date End Date Taking? Authorizing Provider  ARIPiprazole (ABILIFY) 5 MG tablet Take 0.5 tablets (2.5 mg total) by mouth daily. 02/08/18   Tonia Ghent, MD  docusate sodium (COLACE) 100 MG capsule Take 1 capsule (100 mg total) by mouth 2 (two) times daily. 01/27/16   Tonia Ghent, MD  escitalopram (LEXAPRO) 20 MG tablet Take 1 tablet (20 mg total) by mouth daily. 02/08/18   Tonia Ghent, MD  HYDROcodone-acetaminophen (NORCO/VICODIN) 5-325 MG tablet Take 1-2 tablets by mouth every 6 (six) hours as needed for severe pain (goal 7 tabs or less per day). 02/08/18   Tonia Ghent, MD  HYDROmorphone (DILAUDID) 2 MG tablet Take 1 tablet (2 mg total) by mouth every 4 (four) hours as needed for severe pain (for kidney stones). 02/08/18  Tonia Ghent, MD  ibuprofen (ADVIL) 200 MG tablet Take 3 tablets (600 mg total) by mouth 2 (two) times daily as needed. Patient taking differently: Take 600 mg by mouth 2 (two) times daily as needed for mild pain.  10/29/15   Tonia Ghent, MD  ondansetron (ZOFRAN ODT) 4 MG disintegrating tablet Take 1 tablet (4 mg total) by mouth every 8 (eight) hours as needed for nausea or vomiting. 02/08/18   Tonia Ghent, MD  pantoprazole (PROTONIX) 40 MG tablet Take 1 tablet (40 mg total) by mouth 2 (two) times daily.  02/08/18   Tonia Ghent, MD  polyethylene glycol powder (GLYCOLAX/MIRALAX) powder MIX 17G IN 4 TO 8 OUNCES OF FLUID AND TAKE TWICE DAILY 02/08/18   Tonia Ghent, MD  tamsulosin (FLOMAX) 0.4 MG CAPS capsule Take 1 capsule (0.4 mg total) by mouth daily. 02/08/18   Tonia Ghent, MD  zolpidem (AMBIEN) 10 MG tablet Take 1 tablet (10 mg total) by mouth at bedtime as needed. for sleep 12/28/17   Tonia Ghent, MD    Family History Family History  Problem Relation Age of Onset  . Stroke Mother   . Liver disease Mother   . Irritable bowel syndrome Mother   . Kidney disease Mother   . Heart disease Father   . Colon polyps Father   . Colon cancer Neg Hx     Social History Social History   Tobacco Use  . Smoking status: Never Smoker  . Smokeless tobacco: Never Used  Substance Use Topics  . Alcohol use: Not Currently    Alcohol/week: 0.0 standard drinks    Comment: rarely  . Drug use: No     Allergies   Phenergan [promethazine hcl]; Tape; Toradol [ketorolac tromethamine]; Trileptal [oxcarbazepine]; and Wellbutrin [bupropion]   Review of Systems Review of Systems  Constitutional: Negative for chills and fever.  HENT: Negative for congestion.   Eyes: Negative for discharge.  Respiratory: Negative for shortness of breath.   Cardiovascular: Negative for chest pain.  Gastrointestinal: Positive for abdominal pain, diarrhea, nausea and vomiting. Negative for blood in stool and constipation.  Genitourinary: Positive for flank pain. Negative for frequency, genital sores, hematuria, scrotal swelling and urgency.  Skin: Negative for rash.  Neurological: Negative for headaches.     Physical Exam Updated Vital Signs BP (!) 136/91 (BP Location: Right Arm)   Pulse 77   Temp 98.2 F (36.8 C) (Oral)   Resp 16   Ht 6' (1.829 m)   Wt (!) 157 kg   SpO2 96%   BMI 46.94 kg/m   Physical Exam Vitals signs and nursing note reviewed.  Constitutional:      General: He is not in  acute distress.    Appearance: He is well-developed. He is not toxic-appearing.  HENT:     Head: Normocephalic and atraumatic.  Eyes:     General:        Right eye: No discharge.        Left eye: No discharge.     Conjunctiva/sclera: Conjunctivae normal.     Pupils: Pupils are equal, round, and reactive to light.  Neck:     Musculoskeletal: Normal range of motion and neck supple.  Cardiovascular:     Heart sounds: Normal heart sounds.  Pulmonary:     Effort: Pulmonary effort is normal.  Abdominal:     General: Abdomen is flat. Bowel sounds are normal. There is no distension.     Palpations: Abdomen  is soft.     Tenderness: There is abdominal tenderness in the suprapubic area and left lower quadrant. There is right CVA tenderness and left CVA tenderness. There is no rebound.     Comments: Patient is hypersensitive to light touch.  Musculoskeletal: Normal range of motion.        General: No tenderness.  Lymphadenopathy:     Cervical: No cervical adenopathy.  Skin:    General: Skin is warm and dry.     Capillary Refill: Capillary refill takes less than 2 seconds.     Findings: No rash.  Neurological:     Mental Status: He is alert and oriented to person, place, and time.  Psychiatric:        Behavior: Behavior normal.        Thought Content: Thought content normal.        Judgment: Judgment normal.      ED Treatments / Results  Labs (all labs ordered are listed, but only abnormal results are displayed) Labs Reviewed  CBC WITH DIFFERENTIAL/PLATELET - Abnormal; Notable for the following components:      Result Value   RBC 5.87 (*)    Hemoglobin 17.1 (*)    HCT 52.7 (*)    All other components within normal limits  COMPREHENSIVE METABOLIC PANEL - Abnormal; Notable for the following components:   Glucose, Bld 115 (*)    Calcium 8.4 (*)    AST 43 (*)    ALT 63 (*)    All other components within normal limits  URINALYSIS, ROUTINE W REFLEX MICROSCOPIC     EKG None  Radiology Ct Renal Stone Study  Result Date: 02/21/2018 CLINICAL DATA:  Left groin pain, bilateral lower back pain x 1 week consistently, h/o kidney stones, several surgeries for kidney stones, cholecystectomy, appendectomy, spine implant. EXAM: CT ABDOMEN AND PELVIS WITHOUT CONTRAST TECHNIQUE: Multidetector CT imaging of the abdomen and pelvis was performed following the standard protocol without IV contrast. COMPARISON:  06/15/2017 FINDINGS: Lower chest: No acute abnormality. Hepatobiliary: Fatty liver. Cholecystectomy clips. No focal lesion or biliary ductal dilatation. Pancreas: Unremarkable. No pancreatic ductal dilatation or surrounding inflammatory changes. Spleen: Normal in size without focal abnormality. Adrenals/Urinary Tract: Normal adrenal glands. Bilateral nephrolithiasis. Largest stone or cluster in the right lower pole 9 Mm. Largest stone on the left in the upper pole 2 mm. No hydronephrosis or ureterectasis. Urinary bladder is nondistended. Stomach/Bowel: Stomach and small bowel decompressed. Appendix surgically absent. The colon is nondilated, unremarkable. Vascular/Lymphatic: No significant vascular findings are present. No enlarged abdominal or pelvic lymph nodes. Reproductive: Prostate is unremarkable. Other: No ascites. Stable right pelvic phleboliths. No free air. Musculoskeletal: Small umbilical hernia containing only mesenteric fat. implanted dorsal stimulator device extending up to the T9 level. No fracture or worrisome bone lesion. IMPRESSION: 1. Bilateral nephrolithiasis without hydronephrosis. 2. Fatty liver. Electronically Signed   By: Lucrezia Europe M.D.   On: 02/21/2018 17:04    Procedures Procedures (including critical care time)  Medications Ordered in ED Medications  sodium chloride 0.9 % bolus 1,000 mL (0 mLs Intravenous Stopped 02/21/18 1743)  HYDROmorphone (DILAUDID) injection 2 mg (2 mg Intravenous Given 02/21/18 1703)     Initial Impression /  Assessment and Plan / ED Course  I have reviewed the triage vital signs and the nursing notes.  Pertinent labs & imaging results that were available during my care of the patient were reviewed by me and considered in my medical decision making (see chart for details).  Patient presents the ED for chronic bilateral flank pain.  History of chronic kidney stones.  Patient's vital signs reassuring.  He is afebrile.  He does have pain to palpation of the right lower abdomen and bilateral flanks with light touch.  Bowel sounds present.  No signs of peritonitis.  Deferred GU exam.  Labs reassuring.  No leukocytosis.  Normal kidney function.  Mild elevation liver enzymes.  Normal bilirubin.  UA shows no signs of infection or hemoglobin noted.  CT scan performed shows bilateral nephrolithiasis without any hydro-nephrosis.  No straining noted.  Does have some fatty liver but no other acute intra-abdominal pathology to explain patient's pain.  Patient has been seen several times for the same symptoms.  Patient seems consistent with patient's chronic pain.  Presentation not consistent with testicular torsion or epididymitis at this time.  He does see chronic pain management and is taking hydrocodone and Dilaudid at home.  Patient given a dose of pain medicine in the ED however given his reassuring work-up he can follow-up with his primary care doctor, pain management and urology.  I discussed reasons to return to the ED immediately including fevers, worsening pain or worsening vomiting.  Patient has tolerated p.o. fluids in the ED.  He is not toxic or septic appearing.  No signs of a surgical abdomen on discharge.  Pt is hemodynamically stable, in NAD, & able to ambulate in the ED. Evaluation does not show pathology that would require ongoing emergent intervention or inpatient treatment. I explained the diagnosis to the patient. Pain has been managed & has no complaints prior to dc. Pt is comfortable with above plan  and is stable for discharge at this time. All questions were answered prior to disposition. Strict return precautions for f/u to the ED were discussed. Encouraged follow up with PCP.   Final Clinical Impressions(s) / ED Diagnoses   Final diagnoses:  Flank pain    ED Discharge Orders    None       Aaron Edelman 02/21/18 1752    Blanchie Dessert, MD 02/21/18 [phone]

## 2018-02-21 NOTE — ED Notes (Signed)
EDP at bedside  

## 2018-02-21 NOTE — ED Notes (Addendum)
Pt c/o groin pain, walking like he has back pain, hx of kidney stones and chronic pain syndrome, drove self here. Pt has pain contract and prescriptions for his chronic groin and back pain, including dilaudid 2mg  PO.

## 2018-02-21 NOTE — Discharge Instructions (Addendum)
Work-up reassuring today.  CAT scan shows no acute findings.  You do have some fat in your liver.  Drink plenty of fluids to hydrate.  Take your pain medication at home.  Follow-up with urology.  Return to the ED with any worsening symptoms.

## 2018-03-02 DIAGNOSIS — N200 Calculus of kidney: Secondary | ICD-10-CM | POA: Diagnosis not present

## 2018-03-02 DIAGNOSIS — Z9079 Acquired absence of other genital organ(s): Secondary | ICD-10-CM | POA: Diagnosis not present

## 2018-03-08 ENCOUNTER — Other Ambulatory Visit: Payer: Self-pay | Admitting: *Deleted

## 2018-03-08 MED ORDER — HYDROMORPHONE HCL 2 MG PO TABS: 2.0000 mg | ORAL_TABLET | ORAL | 0 refills | Status: DC | PRN

## 2018-03-08 MED ORDER — HYDROCODONE-ACETAMINOPHEN 5-325 MG PO TABS: 1.0000 | ORAL_TABLET | Freq: Four times a day (QID) | ORAL | 0 refills | Status: DC | PRN

## 2018-03-08 NOTE — Telephone Encounter (Signed)
Sent. Thanks.   

## 2018-03-08 NOTE — Telephone Encounter (Signed)
Name of Medication: Dilaudid and Hydrocodone Name of Pharmacy: [facility] Date and Quantity: Dilaudid 1/16 #30 Hydrocodone 02/08/18 #120 Last Office Visit and Type: 02/08/2018 f/u Next Office Visit and Type: None Last Controlled Substance Agreement Date: 03/04/16 Last UDS: 08/09/17

## 2018-03-19 ENCOUNTER — Other Ambulatory Visit: Payer: Self-pay | Admitting: Family Medicine

## 2018-03-20 ENCOUNTER — Encounter: Payer: Self-pay | Admitting: Family Medicine

## 2018-03-20 NOTE — Telephone Encounter (Signed)
Electronic refill request. Zolpidem Last office visit:   02/08/2018 Last Filled:    90 tablet 0 12/28/2017  Please advise.   See MyChart message from patient regarding early fill

## 2018-03-21 NOTE — Telephone Encounter (Signed)
Please check with patient on this.  Why did he run out early?  Let me know.  We will go from there.  Thanks.

## 2018-03-23 NOTE — Telephone Encounter (Signed)
Pt called back. He said he was taking double the medication at the beginning of the refill period due to not being able to sleep. He stopped when he had Jan OV. Pt is currently out, ran out this week.

## 2018-03-25 NOTE — Telephone Encounter (Signed)
I don't know when the pharmacy will fill this.  Sent.   I advise him not to double up.

## 2018-03-26 NOTE — Telephone Encounter (Signed)
Patient advised.

## 2018-04-10 ENCOUNTER — Telehealth: Payer: Self-pay | Admitting: Family Medicine

## 2018-04-10 ENCOUNTER — Encounter: Payer: Self-pay | Admitting: Family Medicine

## 2018-04-10 NOTE — Telephone Encounter (Signed)
Pt is requesting refill on hydrocodone and hydromorphone to be called into [facility].

## 2018-04-11 MED ORDER — HYDROMORPHONE HCL 2 MG PO TABS: 2.0000 mg | ORAL_TABLET | ORAL | 0 refills | Status: DC | PRN

## 2018-04-11 MED ORDER — HYDROCODONE-ACETAMINOPHEN 5-325 MG PO TABS: 1.0000 | ORAL_TABLET | Freq: Four times a day (QID) | ORAL | 0 refills | Status: DC | PRN

## 2018-04-11 NOTE — Telephone Encounter (Signed)
Sent.  Thanks. I updated patient via mychart.

## 2018-05-10 ENCOUNTER — Encounter: Payer: Self-pay | Admitting: Family Medicine

## 2018-05-13 ENCOUNTER — Other Ambulatory Visit: Payer: Self-pay | Admitting: Family Medicine

## 2018-05-13 MED ORDER — HYDROCODONE-ACETAMINOPHEN 5-325 MG PO TABS: 1.0000 | ORAL_TABLET | Freq: Four times a day (QID) | ORAL | 0 refills | Status: DC | PRN

## 2018-05-13 MED ORDER — HYDROMORPHONE HCL 2 MG PO TABS: 2.0000 mg | ORAL_TABLET | ORAL | 0 refills | Status: DC | PRN

## 2018-05-13 NOTE — Progress Notes (Signed)
rx sent after mychart request.

## 2018-06-08 ENCOUNTER — Encounter: Payer: Self-pay | Admitting: Family Medicine

## 2018-06-08 NOTE — Telephone Encounter (Signed)
Last filled on 05/13/2018 for #210. LOV 02/08/2018. No future appointments.

## 2018-06-10 MED ORDER — HYDROCODONE-ACETAMINOPHEN 5-325 MG PO TABS: 1.0000 | ORAL_TABLET | Freq: Four times a day (QID) | ORAL | 0 refills | Status: DC | PRN

## 2018-06-10 NOTE — Telephone Encounter (Signed)
I sent the prescription for hydrocodone.  I adjusted the sig based on his pain/med requirement. I can send the Dilaudid if/when needed.  Let me know about that Please get him set up for a pain follow-up visit via web. Thanks for putting in the medications from urology.

## 2018-06-11 NOTE — Telephone Encounter (Signed)
I sent the prescription for hydrocodone.  I adjusted the sig based on his pain/med requirement. I can send the Dilaudid if/when needed.  Let me know about that Please get him set up for a pain follow-up visit via web. Thanks for putting in the medications from urology.

## 2018-06-11 NOTE — Telephone Encounter (Signed)
Pt scheduled 06/12/18 @ 2:30pm

## 2018-06-11 NOTE — Telephone Encounter (Signed)
[place], please make sure he sets up a virtual visit for pain medication management. Thanks

## 2018-06-12 ENCOUNTER — Ambulatory Visit (INDEPENDENT_AMBULATORY_CARE_PROVIDER_SITE_OTHER): Payer: Self-pay | Admitting: Family Medicine

## 2018-06-12 DIAGNOSIS — G8929 Other chronic pain: Secondary | ICD-10-CM

## 2018-06-12 DIAGNOSIS — G894 Chronic pain syndrome: Secondary | ICD-10-CM

## 2018-06-12 MED ORDER — ZOLPIDEM TARTRATE 10 MG PO TABS: 10.0000 mg | ORAL_TABLET | Freq: Every evening | ORAL | 0 refills | Status: DC | PRN
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
## 2018-07-13 ENCOUNTER — Encounter: Payer: Self-pay | Admitting: Family Medicine

## 2018-07-13 ENCOUNTER — Other Ambulatory Visit: Payer: Self-pay

## 2018-07-13 ENCOUNTER — Ambulatory Visit: Payer: Self-pay | Admitting: Family Medicine

## 2018-07-13 VITALS — BP 110/88 | HR 95 | Temp 97.9°F | Ht 72.0 in | Wt 347.0 lb

## 2018-07-13 DIAGNOSIS — G8929 Other chronic pain: Secondary | ICD-10-CM

## 2018-07-13 DIAGNOSIS — M549 Dorsalgia, unspecified: Secondary | ICD-10-CM

## 2018-07-13 DIAGNOSIS — K6289 Other specified diseases of anus and rectum: Secondary | ICD-10-CM

## 2018-07-13 MED ORDER — HYDROMORPHONE HCL 2 MG PO TABS: 2.0000 mg | ORAL_TABLET | ORAL | 0 refills | Status: DC | PRN

## 2018-07-13 MED ORDER — HYDROCODONE-ACETAMINOPHEN 5-325 MG PO TABS: 1.0000 | ORAL_TABLET | Freq: Four times a day (QID) | ORAL | 0 refills | Status: DC | PRN
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
## 2018-07-13 NOTE — Patient Instructions (Addendum)
We'll call about the pain clinic and GI referrals.  Keep going with massages.  Try tizanidine- sedation caution.   Keep working on [employer].  Update me as needed.  I'll await the urology notes.  Take care.  Glad to see you.

## 2018-07-15 DIAGNOSIS — K6289 Other specified diseases of anus and rectum: Secondary | ICD-10-CM | POA: Insufficient documentation

## 2018-07-15 NOTE — Assessment & Plan Note (Signed)
He still has fissures and hemorrhoids and bowel regimen d/w pt. referred to GI.
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
## 2018-08-14 ENCOUNTER — Other Ambulatory Visit: Payer: Self-pay | Admitting: *Deleted

## 2018-08-14 NOTE — Telephone Encounter (Signed)
Name of Medication: Hydrocodone Name of [facility]: [facility] Last Fill or Written Date and Quantity: 07/13/18 # 38 Last Office Visit and Type: 07/13/18 Next Office Visit and Type:none scheduled Last Controlled Substance Agreement Date: 03/04/16 Last UDS: 08/09/17

## 2018-08-15 MED ORDER — HYDROCODONE-ACETAMINOPHEN 5-325 MG PO TABS: 1.0000 | ORAL_TABLET | Freq: Four times a day (QID) | ORAL | 0 refills | Status: DC | PRN

## 2018-08-15 NOTE — Telephone Encounter (Addendum)
Sent. Thanks.  Please check with patient.  See if he was able to get set up with the pain clinic and the GI clinic.

## 2018-08-16 NOTE — Telephone Encounter (Signed)
Noted. Thanks.
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
## 2018-09-14 ENCOUNTER — Other Ambulatory Visit: Payer: Self-pay

## 2018-09-14 NOTE — Telephone Encounter (Signed)
Name of Medication: Hydrocodone apap 5-325 mg Name of Pharmacy: [facility] Last [place] or Written Date and Quantity:#240 on 08/15/18  Last Office Visit and Type: 07/13/18 pain mgt  Next Office Visit and Type: none scheduled Last Controlled Substance Agreement Date: 08/14/2017 Last UDS:08/09/2017

## 2018-09-14 NOTE — Telephone Encounter (Signed)
Name of Medication: zolpidem 10 mg Name of Pharmacy: [facility] Last Fill or Written Date and Quantity:#90 on 06/12/18  Last Office Visit and Type: 07/13/18 pain mgt  Next Office Visit and Type: none scheduled Last Controlled Substance Agreement Date: 08/14/2017 Last UDS:08/09/2017

## 2018-09-15 ENCOUNTER — Other Ambulatory Visit: Payer: Self-pay | Admitting: Family Medicine

## 2018-09-16 MED ORDER — HYDROCODONE-ACETAMINOPHEN 5-325 MG PO TABS: 1.0000 | ORAL_TABLET | Freq: Four times a day (QID) | ORAL | 0 refills | Status: DC | PRN

## 2018-09-16 MED ORDER — ZOLPIDEM TARTRATE 10 MG PO TABS: 10.0000 mg | ORAL_TABLET | Freq: Every evening | ORAL | 0 refills | Status: DC | PRN

## 2018-09-16 NOTE — Telephone Encounter (Signed)
Sent. Thanks.   

## 2018-09-17 ENCOUNTER — Encounter: Payer: Self-pay | Admitting: Family Medicine

## 2018-09-17 NOTE — Telephone Encounter (Signed)
Electronic refill request. Zolpidem Last office visit:   07/13/2018 Last Filled:    90 tablet 0 09/16/2018  Please advise.

## 2018-09-17 NOTE — Telephone Encounter (Signed)
Pt left v/m that [facility] said on rx for ambien there was a note to fill on or after 08/22/18. Pt said because the last 2 months had 31 days and pt may have misplaced couple of pills of ambien that pt request the Lorrin Mais can be picked up on 09/18/18. Pt request cb.

## 2018-09-18 ENCOUNTER — Telehealth: Payer: Self-pay | Admitting: Family Medicine

## 2018-09-18 NOTE — Telephone Encounter (Signed)
Please call pharmacy and given the okay to fill ambien at this point given the prev 31 day months.  Thanks.

## 2018-09-18 NOTE — Telephone Encounter (Signed)
rx prev sent.

## 2018-09-18 NOTE — Telephone Encounter (Signed)
Phoned the pharmacy as directed.

## 2018-10-15 ENCOUNTER — Encounter: Payer: Self-pay | Admitting: Family Medicine

## 2018-10-18 ENCOUNTER — Other Ambulatory Visit: Payer: Self-pay | Admitting: Family Medicine

## 2018-10-18 MED ORDER — HYDROCODONE-ACETAMINOPHEN 5-325 MG PO TABS: 1.0000 | ORAL_TABLET | Freq: Four times a day (QID) | ORAL | 0 refills | Status: DC | PRN

## 2018-10-18 MED ORDER — HYDROMORPHONE HCL 2 MG PO TABS: 2.0000 mg | ORAL_TABLET | ORAL | 0 refills | Status: DC | PRN

## 2018-10-25 ENCOUNTER — Telehealth: Payer: Self-pay | Admitting: Family Medicine

## 2018-10-25 NOTE — Telephone Encounter (Signed)
After several attempts to contact the patient for a New Pain clinic appointment we are canceling this Referral per Dr Carole Civil okay.

## 2018-10-26 ENCOUNTER — Other Ambulatory Visit: Payer: Self-pay

## 2018-10-26 ENCOUNTER — Encounter: Payer: Self-pay | Admitting: Family Medicine

## 2018-10-26 ENCOUNTER — Encounter (HOSPITAL_BASED_OUTPATIENT_CLINIC_OR_DEPARTMENT_OTHER): Payer: Self-pay | Admitting: Adult Health

## 2018-10-26 ENCOUNTER — Emergency Department (HOSPITAL_BASED_OUTPATIENT_CLINIC_OR_DEPARTMENT_OTHER)
Admission: EM | Admit: 2018-10-26 | Discharge: 2018-10-26 | Disposition: A | Discharge status: Home / Self Care | Payer: Self-pay | Attending: Emergency Medicine | Admitting: Emergency Medicine

## 2018-10-26 ENCOUNTER — Emergency Department (HOSPITAL_BASED_OUTPATIENT_CLINIC_OR_DEPARTMENT_OTHER): Payer: Self-pay

## 2018-10-26 DIAGNOSIS — J019 Acute sinusitis, unspecified: Secondary | ICD-10-CM | POA: Insufficient documentation

## 2018-10-26 DIAGNOSIS — Z20828 Contact with and (suspected) exposure to other viral communicable diseases: Secondary | ICD-10-CM | POA: Insufficient documentation

## 2018-10-26 DIAGNOSIS — Z79899 Other long term (current) drug therapy: Secondary | ICD-10-CM | POA: Insufficient documentation

## 2018-10-26 DIAGNOSIS — R6883 Chills (without fever): Secondary | ICD-10-CM | POA: Insufficient documentation

## 2018-10-26 DIAGNOSIS — H8113 Benign paroxysmal vertigo, bilateral: Secondary | ICD-10-CM | POA: Insufficient documentation

## 2018-10-26 DIAGNOSIS — R0602 Shortness of breath: Secondary | ICD-10-CM | POA: Insufficient documentation

## 2018-10-26 DIAGNOSIS — R05 Cough: Secondary | ICD-10-CM | POA: Insufficient documentation

## 2018-10-26 DIAGNOSIS — R059 Cough, unspecified: Secondary | ICD-10-CM

## 2018-10-26 DIAGNOSIS — M791 Myalgia, unspecified site: Secondary | ICD-10-CM | POA: Insufficient documentation

## 2018-10-26 LAB — CBC WITH DIFFERENTIAL/PLATELET
Abs Immature Granulocytes: 0.08 10*3/uL — ABNORMAL HIGH (ref 0.00–0.07)
Basophils Absolute: 0.1 10*3/uL (ref 0.0–0.1)
Basophils Relative: 1 %
Eosinophils Absolute: 0.3 10*3/uL (ref 0.0–0.5)
Eosinophils Relative: 3 %
HCT: 53.9 % — ABNORMAL HIGH (ref 39.0–52.0)
Hemoglobin: 18.2 g/dL — ABNORMAL HIGH (ref 13.0–17.0)
Immature Granulocytes: 1 %
Lymphocytes Relative: 22 %
Lymphs Abs: 2.9 10*3/uL (ref 0.7–4.0)
MCH: 30.2 pg (ref 26.0–34.0)
MCHC: 33.8 g/dL (ref 30.0–36.0)
MCV: 89.4 fL (ref 80.0–100.0)
Monocytes Absolute: 1.3 10*3/uL — ABNORMAL HIGH (ref 0.1–1.0)
Monocytes Relative: 10 %
Neutro Abs: 8.2 10*3/uL — ABNORMAL HIGH (ref 1.7–7.7)
Neutrophils Relative %: 63 %
Platelets: 372 10*3/uL (ref 150–400)
RBC: 6.03 MIL/uL — ABNORMAL HIGH (ref 4.22–5.81)
RDW: 13.5 % (ref 11.5–15.5)
WBC: 12.8 10*3/uL — ABNORMAL HIGH (ref 4.0–10.5)
nRBC: 0 % (ref 0.0–0.2)

## 2018-10-26 LAB — BASIC METABOLIC PANEL
Anion gap: 11 (ref 5–15)
BUN: 15 mg/dL (ref 6–20)
CO2: 29 mmol/L (ref 22–32)
Calcium: 9.9 mg/dL (ref 8.9–10.3)
Chloride: 98 mmol/L (ref 98–111)
Creatinine, Ser: 0.96 mg/dL (ref 0.61–1.24)
GFR calc Af Amer: >60 mL/min (ref >60)
GFR calc non Af Amer: >60 mL/min (ref >60)
Glucose, Bld: 114 mg/dL — ABNORMAL HIGH (ref 70–99)
Potassium: 3.6 mmol/L (ref 3.5–5.1)
Sodium: 138 mmol/L (ref 135–145)

## 2018-10-26 MED ORDER — MECLIZINE HCL 25 MG PO TABS: 25.0000 mg | ORAL_TABLET | Freq: Three times a day (TID) | ORAL | 0 refills | Status: DC | PRN

## 2018-10-26 MED ORDER — SODIUM CHLORIDE 0.9 % IV BOLUS
1000.0000 mL | Freq: Once | INTRAVENOUS | Status: AC
  Administered 2018-10-26: 1000 mL via INTRAVENOUS

## 2018-10-26 MED ORDER — AMOXICILLIN-POT CLAVULANATE 875-125 MG PO TABS: 1.0000 | ORAL_TABLET | Freq: Two times a day (BID) | ORAL | 0 refills | Status: DC

## 2018-10-26 MED ORDER — ONDANSETRON HCL 4 MG/2ML IJ SOLN
INTRAMUSCULAR | Status: AC
  Administered 2018-10-26: 4 mg
  Filled 2018-10-26: qty 2

## 2018-10-26 MED ORDER — MECLIZINE HCL 25 MG PO TABS
25.0000 mg | ORAL_TABLET | Freq: Once | ORAL | Status: AC
  Administered 2018-10-26: 25 mg via ORAL
  Filled 2018-10-26: qty 1

## 2018-10-26 NOTE — Telephone Encounter (Signed)
[facility] - Client TELEPHONE ADVICE RECORD [facility] Patient Name: Casey Caldwell Gender: Male DOB: 1980-09-28 Age: 38 Y 57 M 23 D Return Phone Number: [phone] (Primary) Address: City/State/Zip: [place] [address] Client [facility] - Client Client Site [place] Physician Renford Dills - MD Contact Type Call Who Is Calling Patient / Member / Family / Caregiver Call Type Triage / Clinical Relationship To Patient Self Return Phone Number [phone] (Primary) Chief Complaint Dizziness Reason for Call Symptomatic / Request for Health Information Initial Comment Caller states he's having a reaction to his medication, has been dizzy. Translation No Nurse Assessment Guidelines Guideline Title Affirmed Question Affirmed Notes Nurse Date/Time (Eastern Time) Disp. Time Eilene Ghazi Time) Disposition Final User 10/26/2018 8:19:36 AM Attempt made - message left Etter Sjogren 10/26/2018 8:43:02 AM FINAL ATTEMPT MADE - message left

## 2018-10-26 NOTE — ED Provider Notes (Signed)
[facility] EMERGENCY DEPARTMENT Provider Note   CSN: [id number] Arrival date & time: 10/26/18  1332     History   Chief Complaint Chief Complaint  Patient presents with  . Dizziness    HPI Casey Caldwell is a 38 y.o. male.     Casey Caldwell is a 38 y.o. 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  Patient reports dizziness is specifically onset when he turns his head, when he looks straight ahead he does not experience any dizziness.  He describes this as a room spinning sensation.  He reports it is occasionally accompanied with some pressure over the top of his head.  He also reports that he feels very nauseous but has not had any vomiting.  He reports when symptoms started he also began experiencing nasal congestion, rhinorrhea, ear pressure and an intermittently productive cough.  He has had some intermittent shortness of breath described as chest tightness but no persistent chest pain.  He has had chills but no measured fevers.  He reports feeling fatigued with some generalized myalgias and muscle cramps.  No focal abdominal pain.  No diarrhea.  He denies any vision changes, numbness, weakness or tingling.  No constant or sudden onset headache.  No previous history of stroke.  He reports he does intermittently have dizziness when he misses doses of his regular medications, which he did prior to symptoms starting but these typically resolve when he resumes normal medication course, but the symptoms have persisted.  He denies any known sick contacts.  He has been taking Zofran intermittently for nausea.  No other aggravating or alleviating factors.     Past Medical History:  Diagnosis Date  . Depression    and panic/anxiety  . GERD (gastroesophageal reflux disease)   . History of kidney stones   . Insomnia   . Kidney stones    . Seizures ([place]) Childhood    Patient Active Problem List   Diagnosis Date Noted  . Rectal pain 07/15/2018  . Plantar fasciitis 08/14/2017  . Encounter for chronic pain management 01/28/2016  . Chronic pain syndrome 10/30/2015  . Adenomatous colon polyp 06/06/2012  . Nephrolithiasis 03/14/2012  . Polycythemia 03/14/2012  . Groin pain 03/14/2012  . RUQ pain 03/14/2012  . Insomnia 03/14/2012  . Seizure disorder ([place]) 03/14/2012  . Anxiety and depression 03/14/2012    Past Surgical History:  Procedure Laterality Date  . APPENDECTOMY  2011  . CHOLECYSTECTOMY, LAPAROSCOPIC    . CYSTOSCOPY/URETEROSCOPY/HOLMIUM LASER Left 06/15/2017   Procedure: CYSTOSCOPY/LEFT URETEROSCOPYSTONE EXTRACTION/LEFT RETROGRADE;  Surgeon: Franchot Gallo, MD;  Location: [facility];  Service: Urology;  Laterality: Left;  With STENT  . HYDROCELE EXCISION Left 06/2012  . LAPAROSCOPIC CHOLECYSTECTOMY  1996  . LITHOTRIPSY     four times over the years; most recent 10/08/12  . ORCHIECTOMY Left 11/2012  . SPINAL CORD STIMULATOR INSERTION  2015  . TONSILLECTOMY  1992  . WRIST GANGLION EXCISION  2002   Right        Home Medications    Prior to Admission medications   Medication Sig Start Date End Date Taking? Authorizing Provider  amoxicillin-clavulanate (AUGMENTIN) 875-125 MG tablet Take 1 tablet by mouth 2 (two) times daily. One po bid x 7 days 10/26/18   Jacqlyn Larsen, PA-C  ARIPiprazole (ABILIFY) 5 MG tablet Take 0.5 tablets (2.5 mg total)  by mouth daily. 02/08/18   Tonia Ghent, MD  docusate sodium (COLACE) 100 MG capsule Take 1 capsule (100 mg total) by mouth 2 (two) times daily. 01/27/16   Tonia Ghent, MD  escitalopram (LEXAPRO) 20 MG tablet Take 1 tablet (20 mg total) by mouth daily. 02/08/18   Tonia Ghent, MD  HYDROcodone-acetaminophen (NORCO/VICODIN) 5-325 MG tablet Take 1-2 tablets by mouth every 6 (six) hours as needed for severe pain (8 tabs or less per day). 10/18/18   Tonia Ghent,  MD  HYDROmorphone (DILAUDID) 2 MG tablet Take 1 tablet (2 mg total) by mouth every 4 (four) hours as needed for severe pain (for kidney stones). 10/18/18   Tonia Ghent, MD  indapamide (LOZOL) 2.5 MG tablet Take 1 tablet by mouth daily. 03/15/18   [provider]  meclizine (ANTIVERT) 25 MG tablet Take 1 tablet (25 mg total) by mouth 3 (three) times daily as needed for dizziness. 10/26/18   Jacqlyn Larsen, PA-C  ondansetron (ZOFRAN ODT) 4 MG disintegrating tablet Take 1 tablet (4 mg total) by mouth every 8 (eight) hours as needed for nausea or vomiting. 02/08/18   Tonia Ghent, MD  pantoprazole (PROTONIX) 40 MG tablet Take 1 tablet (40 mg total) by mouth 2 (two) times daily. 02/08/18   Tonia Ghent, MD  polyethylene glycol powder (GLYCOLAX/MIRALAX) powder MIX 17G IN 4 TO 8 OUNCES OF FLUID AND TAKE TWICE DAILY 02/08/18   Tonia Ghent, MD  potassium citrate (UROCIT-K) 10 MEQ (1080 MG) SR tablet Take 1 tablet by mouth 2 (two) times daily. 05/27/13   [provider]  tamsulosin (FLOMAX) 0.4 MG CAPS capsule Take 1 capsule (0.4 mg total) by mouth daily. 02/08/18   Tonia Ghent, MD  zolpidem (AMBIEN) 10 MG tablet Take 1 tablet (10 mg total) by mouth at bedtime as needed. for sleep.  Fill on/after 09/22/2018 09/16/18   Tonia Ghent, MD    Family History Family History  Problem Relation Age of Onset  . Stroke Mother   . Liver disease Mother   . Irritable bowel syndrome Mother   . Kidney disease Mother   . Heart disease Father   . Colon polyps Father   . Colon cancer Neg Hx     Social History Social History   Tobacco Use  . Smoking status: Never Smoker  . Smokeless tobacco: Never Used  Substance Use Topics  . Alcohol use: Not Currently    Alcohol/week: 0.0 standard drinks    Comment: rarely  . Drug use: No     Allergies   Phenergan [promethazine hcl], Tape, Toradol [ketorolac tromethamine], Trileptal [oxcarbazepine], and Wellbutrin [bupropion]   Review  of Systems Review of Systems  Constitutional: Positive for chills and fatigue. Negative for fever.  HENT: Positive for congestion, ear pain, postnasal drip, rhinorrhea, sinus pressure and sinus pain.   Respiratory: Positive for cough and shortness of breath.   Cardiovascular: Negative for chest pain and leg swelling.  Gastrointestinal: Positive for nausea. Negative for abdominal pain, diarrhea and vomiting.  Genitourinary: Negative for dysuria and frequency.  Musculoskeletal: Negative for arthralgias and myalgias.  Skin: Negative for color change and rash.  Neurological: Positive for dizziness. Negative for syncope, facial asymmetry, weakness, light-headedness, numbness and headaches.     Physical Exam Updated Vital Signs BP 114/79 (BP Location: Left Arm)   Pulse 82   Temp 98.3 F (36.8 C) (Oral)   Resp 17   SpO2 96%  Physical Exam Vitals signs and nursing note reviewed.  Constitutional:      General: He is not in acute distress.    Appearance: He is well-developed. He is not diaphoretic.  HENT:     Head: Normocephalic and atraumatic.     Right Ear: Ear canal normal.     Left Ear: Ear canal normal.     Ears:     Comments: Bilateral TMs with clear effusion, there is no erythema, and TMs still with good light reflection.    Nose:     Comments: Bilateral nares with erythematous nasal membranes, moderate edema, clear rhinorrhea present.  Some tenderness over the sinuses.    Mouth/Throat:     Comments: Posterior oropharynx clear and mucous membranes moist, there is mild erythema but no edema or tonsillar exudates, uvula midline, normal phonation, no trismus, tolerating secretions without difficulty. Eyes:     General:        Right eye: No discharge.        Left eye: No discharge.     Pupils: Pupils are equal, round, and reactive to light.  Neck:     Musculoskeletal: Neck supple.  Cardiovascular:     Rate and Rhythm: Normal rate and regular rhythm.     Heart sounds: Normal  heart sounds. No murmur. No friction rub. No gallop.   Pulmonary:     Effort: Pulmonary effort is normal. No respiratory distress.     Breath sounds: Normal breath sounds. No wheezing or rales.     Comments: Respirations equal and unlabored, patient able to speak in full sentences, lungs clear to auscultation bilaterally Abdominal:     General: Bowel sounds are normal. There is no distension.     Palpations: Abdomen is soft. There is no mass.     Tenderness: There is no abdominal tenderness. There is no guarding.     Comments: Abdomen soft, nondistended, nontender to palpation in all quadrants without guarding or peritoneal signs  Musculoskeletal:        General: No deformity.  Skin:    General: Skin is warm and dry.     Capillary Refill: Capillary refill takes less than 2 seconds.  Neurological:     Mental Status: He is alert.     Coordination: Coordination normal.     Comments: Speech is clear, able to follow commands CN III-XII intact Normal strength in upper and lower extremities bilaterally including dorsiflexion and plantar flexion, strong and equal grip strength Sensation normal to light and sharp touch Moves extremities without ataxia, coordination intact Normal finger to nose and rapid alternating movements No pronator drift  Psychiatric:        Mood and Affect: Mood normal.        Behavior: Behavior normal.      ED Treatments / Results  Labs (all labs ordered are listed, but only abnormal results are displayed) Labs Reviewed  BASIC METABOLIC PANEL - Abnormal; Notable for the following components:      Result Value   Glucose, Bld 114 (*)    All other components within normal limits  CBC WITH DIFFERENTIAL/PLATELET - Abnormal; Notable for the following components:   WBC 12.8 (*)    RBC 6.03 (*)    Hemoglobin 18.2 (*)    HCT 53.9 (*)    Neutro Abs 8.2 (*)    Monocytes Absolute 1.3 (*)    Abs Immature Granulocytes 0.08 (*)    All other components within normal  limits  NOVEL CORONAVIRUS, NAA (  HOSP ORDER, SEND-OUT TO REF LAB; TAT 18-24 HRS)    EKG EKG Interpretation  Date/Time:  Friday October 26 2018 14:35:25 EDT Ventricular Rate:  84 PR Interval:    QRS Duration: 95 QT Interval:  356 QTC Calculation: 421 R Axis:   126 Text Interpretation:  Sinus rhythm Left posterior fascicular block Low voltage, precordial leads No STEMI  Confirmed by Octaviano Glow [id number]) on 10/26/2018 3:29:43 PM   Radiology Dg Chest Port 1 View  Result Date: 10/26/2018 CLINICAL DATA:  Shortness of breath EXAM: PORTABLE CHEST 1 VIEW COMPARISON:  01/27/2017 FINDINGS: The heart size and mediastinal contours are within normal limits. Both lungs are clear. The visualized skeletal structures are unremarkable. Spinal nerve stimulator projects over the lower thoracic spine. IMPRESSION: No active disease. Electronically Signed   By: Zetta Bills M.D.   On: 10/26/2018 14:47    Procedures Procedures (including critical care time)  Medications Ordered in ED Medications  sodium chloride 0.9 % bolus 1,000 mL (1,000 mLs Intravenous New Bag/Given 10/26/18 1443)  meclizine (ANTIVERT) tablet 25 mg (25 mg Oral Given 10/26/18 1447)  ondansetron (ZOFRAN) 4 MG/2ML injection (4 mg  Given 10/26/18 1446)     Initial Impression / Assessment and Plan / ED Course  I have reviewed the triage vital signs and the nursing notes.  Pertinent labs & imaging results that were available during my care of the patient were reviewed by me and considered in my medical decision making (see chart for details).  38 year old male presents to the ED for evaluation of positional dizziness present over the past 5 days.  This is associated with upper respiratory symptoms including nasal congestion, postnasal drip and bilateral ear pressure.  He is also had some intermittent cough and shortness of breath.  No fevers but does report fatigue and myalgias.  Denies any known sick contacts.  He does not have any  neurologic deficits on exam and symptoms are specifically reproducible with changes in head position and I feel this is most consistent with BPPV, likely in the setting of sinusitis and disruption of the inner ear.  Will check basic labs, EKG and chest x-ray given some intermittent chest tightness will also send COVID swab.  Will treat with 1 L fluids, and meclizine and reevaluate.  Lab work shows slight leukocytosis of 12.8, hemoglobin elevated at 18.2,, patient's baseline is between 16 and 17, due to symptoms he has been sleeping and has not had much to eat or drink and I do suspect some degree of dehydration, and hemoconcentration here, will have him follow-up with PCP to have this rechecked, 1 L IV fluids given.  Glucose of 114 but no other acute electrolyte derangements, normal creatinine.  COVID swab pending.  Chest x-ray is clear and EKG without any concerning changes.  Vitals normal.  On reevaluation patient reports significant improvement in dizziness with meclizine.  At this time feel he is stable for discharge home, given persistent sinus symptoms will start patient on Augmentin I have also encouraged him to use over-the-counter decongestants to help relieve pressure to the inner ear, will send home with information on Epley maneuvers as well.  ENT follow-up if symptoms are not improving.  Return precautions discussed.  Patient discharged home in good condition.  Case discussed with Dr. Langston Masker who is in agreement with plan.  Final Clinical Impressions(s) / ED Diagnoses   Final diagnoses:  Benign paroxysmal positional vertigo due to bilateral vestibular disorder  Acute non-recurrent sinusitis, unspecified location  Cough  ED Discharge Orders         Ordered    meclizine (ANTIVERT) 25 MG tablet  3 times daily PRN     10/26/18 1604    amoxicillin-clavulanate (AUGMENTIN) 875-125 MG tablet  2 times daily     10/26/18 1604           Jacqlyn Larsen, [place] 10/26/18 1622    Wyvonnia Dusky, MD 10/26/18 1919

## 2018-10-26 NOTE — ED Triage Notes (Signed)
Presents with dizziness that is described as  "when I move my head or walk, it feels like the room is trying to catch up" HE endorses sneezing, post nasal drip and cough. HE also reports that his ears feel full. Denies fevers. Reports chills.

## 2018-10-26 NOTE — Discharge Instructions (Signed)
Please take meclizine 3 times daily as needed for dizziness and perform the Epley maneuvers using the instructions given today.  Please follow-up with the ENT if dizziness persists.  If you develop constant dizziness that is present regardless of head position, severe headache, vision changes, numbness or weakness return to the ED immediately.  I do think that this dizziness is likely related to sinus inflammation affecting the inner ear, take antibiotics, use decongestants like Zyrtec and Flonase to help relieve ear pressure.  You do have a coronavirus test pending regarding your symptoms, please continue to quarantine at home until you receive these results in 2 to 3 days.  Return to the ED for any new or worsening symptoms.

## 2018-10-26 NOTE — Telephone Encounter (Signed)
I spoke with pt; starting on 10/22/18 pt began to get dizzy; pt forgot to take indapamide,escitalopram,and aripiprazole for 2 days last weekend. Pt restarted on 10/22/18.since restarting meds pt is still dizzy and when stands has to catch himself or he will fall. Pt said room spins around and pt is nauseated. Pt has had H/A and SOB upon exertion or sitting.pt has S/T, fever and chills on and off and dry cough. Pt has had loose BMs and leg cramping.  No CP. pts wife will take pt to UC or ED. FYI to DR Damita Dunnings.

## 2018-10-27 LAB — NOVEL CORONAVIRUS, NAA (HOSP ORDER, SEND-OUT TO REF LAB; TAT 18-24 HRS): SARS-CoV-2, NAA: NOT DETECTED

## 2018-11-15 ENCOUNTER — Other Ambulatory Visit: Payer: Self-pay

## 2018-11-15 ENCOUNTER — Encounter: Payer: Self-pay | Admitting: Family Medicine

## 2018-11-15 ENCOUNTER — Ambulatory Visit (INDEPENDENT_AMBULATORY_CARE_PROVIDER_SITE_OTHER): Payer: Self-pay | Admitting: Family Medicine

## 2018-11-15 VITALS — BP 122/84 | HR 68 | Temp 97.7°F | Ht 72.0 in | Wt 354.1 lb

## 2018-11-15 DIAGNOSIS — Z23 Encounter for immunization: Secondary | ICD-10-CM

## 2018-11-15 DIAGNOSIS — G8929 Other chronic pain: Secondary | ICD-10-CM

## 2018-11-15 MED ORDER — HYDROCODONE-ACETAMINOPHEN 5-325 MG PO TABS: 1.0000 | ORAL_TABLET | Freq: Four times a day (QID) | ORAL | 0 refills | Status: DC | PRN

## 2018-11-15 MED ORDER — HYDROMORPHONE HCL 2 MG PO TABS: 2.0000 mg | ORAL_TABLET | ORAL | 0 refills | Status: DC | PRN

## 2018-11-15 NOTE — Patient Instructions (Signed)
Don't change your meds for now.  Keep working on meal prep.  Let me check on options for the pain clinic.  We'll be in touch.  Flu shot today.  Take care.  Glad to see you.
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
## 2018-12-12 ENCOUNTER — Other Ambulatory Visit: Payer: Self-pay | Admitting: Family Medicine

## 2018-12-13 ENCOUNTER — Encounter: Payer: Self-pay | Admitting: Family Medicine

## 2018-12-13 NOTE — Telephone Encounter (Signed)
Electronic refill request. Zolpidem Last office visit:   11/15/2018 Last Filled:     90 tablet 0 09/16/2018  Please advise.

## 2018-12-14 NOTE — Telephone Encounter (Signed)
Sent. Thanks.   

## 2018-12-18 ENCOUNTER — Encounter: Payer: Self-pay | Admitting: Family Medicine

## 2018-12-19 MED ORDER — HYDROCODONE-ACETAMINOPHEN 5-325 MG PO TABS: 1.0000 | ORAL_TABLET | Freq: Four times a day (QID) | ORAL | 0 refills | Status: DC | PRN

## 2018-12-19 MED ORDER — HYDROMORPHONE HCL 2 MG PO TABS: 2.0000 mg | ORAL_TABLET | ORAL | 0 refills | Status: DC | PRN

## 2018-12-19 NOTE — Telephone Encounter (Signed)
Last office visit 11/15/2018 for chronic pain.  Last refilled 11/15/2018 x 1 moth supply. UDS/Contract 08/09/2017.  No future appointments.

## 2018-12-19 NOTE — Telephone Encounter (Signed)
Sent. Thanks.   

## 2019-01-11 ENCOUNTER — Ambulatory Visit (INDEPENDENT_AMBULATORY_CARE_PROVIDER_SITE_OTHER): Payer: Self-pay | Admitting: Family Medicine

## 2019-01-11 ENCOUNTER — Encounter: Payer: Self-pay | Admitting: Family Medicine

## 2019-01-11 ENCOUNTER — Other Ambulatory Visit: Payer: Self-pay

## 2019-01-11 VITALS — BP 128/100 | HR 93 | Temp 97.1°F | Ht 72.0 in | Wt 338.1 lb

## 2019-01-11 DIAGNOSIS — M109 Gout, unspecified: Secondary | ICD-10-CM

## 2019-01-11 DIAGNOSIS — G894 Chronic pain syndrome: Secondary | ICD-10-CM

## 2019-01-11 LAB — BASIC METABOLIC PANEL
BUN: 15 mg/dL (ref 6–23)
CO2: 33 mEq/L — ABNORMAL HIGH (ref 19–32)
Calcium: 10.3 mg/dL (ref 8.4–10.5)
Chloride: 91 mEq/L — ABNORMAL LOW (ref 96–112)
Creatinine, Ser: 0.99 mg/dL (ref 0.40–1.50)
GFR: 83.43 mL/min (ref >60.00)
Glucose, Bld: 98 mg/dL (ref 70–99)
Potassium: 3.5 mEq/L (ref 3.5–5.1)
Sodium: 137 mEq/L (ref 135–145)

## 2019-01-11 LAB — URIC ACID: Uric Acid, Serum: 11.9 mg/dL — ABNORMAL HIGH (ref 4.0–7.8)

## 2019-01-11 MED ORDER — COLCHICINE 0.6 MG PO TABS: ORAL_TABLET | ORAL | 1 refills | Status: DC

## 2019-01-11 MED ORDER — HYDROCODONE-ACETAMINOPHEN 5-325 MG PO TABS: 1.0000 | ORAL_TABLET | Freq: Four times a day (QID) | ORAL | 0 refills | Status: DC | PRN

## 2019-01-11 MED ORDER — HYDROMORPHONE HCL 2 MG PO TABS: 2.0000 mg | ORAL_TABLET | ORAL | 0 refills | Status: DC | PRN

## 2019-01-11 NOTE — Patient Instructions (Addendum)
Go to the lab on the way out.  We'll contact you with your lab report. Start colchicine.  Update me if not better with that.   Take care.  Glad to see you.

## 2019-01-11 NOTE — Progress Notes (Signed)
This visit occurred during the SARS-CoV-2 public health emergency.  Safety protocols were in place, including screening questions prior to the visit, additional usage of staff PPE, and extensive cleaning of exam room while observing appropriate contact time as indicated for disinfecting solutions.  Advance directive d/w pt.  Deeann Dowse designated if patient were incapacitated.  Reaffirmed 01/11/19.   Indication for chronic opioid: Chronic back and groin and leg pain, with spinal cord stimulator placed previously. Medication and dose: Hydromorphone 2 mg every 4 hours as needed for kidney stones, 30 per month. Hydrocodone 5/325,1-2every 6 hours as needed for pain. #240 per month # pills per month: See above Last UDS date: 08/09/17 Pain contract signed (Y/N): yes Date narcotic database last reviewed (include red flags): 01/11/19  Pain inventory (1-10) Average pain: worse with activity Pain now: worse than normal, with likely gout flare, see below. My pain is constant, "miserable" with activity, better at rest Pain is worse with activity.  Relief from meds: some  In the last 24 hours, how much has pain interfered with the following ? General activity- sig limitation Relationships with others- sig limitation, along with pandemic.   Enjoyment of life- see below re: divorce.   What time of the day is the pain the worst- during/after activity/with bending.  Sleep is described by patient as "on the couch" with some restless nights, with divorce pending.     Mobility/function Assistance device: no How many minutes can you walk: limited from pain, esp with gout Able to climb steps:yes Driving:yes Disabled:not permanently disabled  Bowel or bladder symptoms: no Mood:No SI/HI.   Physicians involved in care: he has pain clinic pending for 02/2019.  Any changes since last visit? See below.   Still with sig R lower back pain and B thigh tingling w/o weakness in the BLE.  He has not passed any  renal stones recently.    R foot pain.  Pain walking.  Puffy, sore, ttp at R 1st MTP.  No h/o gout.  Waking from pain even when laying down.  No trauma.    He is in the midst of a divorce, this is a recent development.  D/w pt.  They are still living together given the pandemic but the plan is for divorce.    Intentional weight loss noted with diet/carb restriction.    Meds, vitals, and allergies reviewed.   ROS: Per HPI unless specifically indicated in ROS section   GEN: nad, alert and oriented HEENT: ncat NECK: supple w/o LA CV: rrr PULM: ctab, no inc wob ABD: soft, +bs EXT: no edema SKIN: no acute rash Midline back not ttp but R lower back TTP.  R 1st MTP pink/ttp with normal DP pulse.  Walking with limp due to pain.

## 2019-01-13 DIAGNOSIS — M109 Gout, unspecified: Secondary | ICD-10-CM | POA: Insufficient documentation

## 2019-01-13 NOTE — Assessment & Plan Note (Signed)
Likely gout.  Check uric acid.  See notes on labs.  We may need to change indapamide given his uric acid results.  I did not change yet since he was in the midst of a gout flare and this is likely his first such episode.  Treat with colchicine.  Routine cautions given to patient.

## 2019-01-13 NOTE — Assessment & Plan Note (Signed)
Indication for chronic opioid: Chronic back and groin and leg pain, with spinal cord stimulator placed previously. Medication and dose: Hydromorphone 2 mg every 4 hours as needed for kidney stones, 30 per month. Hydrocodone 5/325,1-2every 6 hours as needed for pain. #240 per month # pills per month: See above Last UDS date: 08/09/17 Pain contract signed (Y/N): yes Date narcotic database last reviewed (include red flags): 01/11/19  No change in meds at this point.  The goal is to set him up with the pain clinic as soon as possible in 2021 and see what adjustments can be made otherwise with his stimulator/medication/etc.  He agrees.  Not sedated.  Continue as is for now.  He agrees.  We talked about being as active as possible to try to get his weight down.  He is working on dieting with a low carbohydrate plan.  We talked about his living situation with his divorce pending.  At this point he is still okay for outpatient follow-up.  No suicidal homicidal intent.

## 2019-01-16 ENCOUNTER — Encounter: Payer: Self-pay | Admitting: Family Medicine

## 2019-01-30 ENCOUNTER — Encounter: Payer: Self-pay | Admitting: Family Medicine

## 2019-02-01 ENCOUNTER — Encounter: Payer: Self-pay | Admitting: Family Medicine

## 2019-02-01 ENCOUNTER — Ambulatory Visit (INDEPENDENT_AMBULATORY_CARE_PROVIDER_SITE_OTHER): Payer: Self-pay | Admitting: Family Medicine

## 2019-02-01 ENCOUNTER — Other Ambulatory Visit: Payer: Self-pay

## 2019-02-01 ENCOUNTER — Ambulatory Visit (INDEPENDENT_AMBULATORY_CARE_PROVIDER_SITE_OTHER)
Admission: RE | Admit: 2019-02-01 | Discharge: 2019-02-01 | Disposition: A | Discharge status: Home / Self Care | Payer: Self-pay | Source: Ambulatory Visit | Attending: Family Medicine | Admitting: Family Medicine

## 2019-02-01 VITALS — BP 126/84 | HR 62 | Temp 97.1°F | Wt 337.0 lb

## 2019-02-01 DIAGNOSIS — M79671 Pain in right foot: Secondary | ICD-10-CM

## 2019-02-01 DIAGNOSIS — M109 Gout, unspecified: Secondary | ICD-10-CM

## 2019-02-01 MED ORDER — PREDNISONE 10 MG PO TABS: ORAL_TABLET | ORAL | 0 refills | Status: DC

## 2019-02-01 NOTE — Patient Instructions (Addendum)
Prednisone with food.  Use a post op shoe if not better.  Continue colchicine twice a day for now.  Taper off when better.  Update me as needed.  Take care.  Glad to see you.

## 2019-02-01 NOTE — Progress Notes (Signed)
This visit occurred during the SARS-CoV-2 public health emergency.  Safety protocols were in place, including screening questions prior to the visit, additional usage of staff PPE, and extensive cleaning of exam room while observing appropriate contact time as indicated for disinfecting solutions.  Foot pain f/u.  He got some better but not resolved.  Prev gout dx d/w pt.  Prev uric acid elevated, d/w pt.  Still with annoying pain but able to bear weight.  He can't walk for exercise given his current pain.  Pain still at R 1st MTP.  Less puffy now.  Still with a little residual local irritation but better.    Home situation d/w pt.  He is still in therapy with divorce pending.   Meds, vitals, and allergies reviewed.   ROS: Per HPI unless specifically indicated in ROS section   nad ncat Right foot without significant puffiness.  Minimal scant pinkish changes still noted at the right first MTP but he does have tenderness to palpation and pain with range of motion at the first MTP.  Normal dorsalis pedis pulse.  Distally the first toe is not tender.  Foot not tender otherwise.  X-rays negative.  Discussed with patient at office visit.

## 2019-02-03 NOTE — Assessment & Plan Note (Signed)
Likely incomplete response to colchicine.  Improved but not resolved.  Continue colchicine for now.  He can use a postop shoe if needed for comfort.  Add on prednisone with routine steroid cautions in the meantime.  He will update me if not better.  He agrees with plan.  Okay for outpatient follow-up.  Would not start prophylactic medication at this point in the midst of a flare.  Discussed.  He agrees.

## 2019-02-13 ENCOUNTER — Encounter: Payer: Self-pay | Admitting: Family Medicine

## 2019-02-14 MED ORDER — HYDROMORPHONE HCL 2 MG PO TABS: 2.0000 mg | ORAL_TABLET | ORAL | 0 refills | Status: DC | PRN

## 2019-02-14 MED ORDER — HYDROCODONE-ACETAMINOPHEN 5-325 MG PO TABS: 1.0000 | ORAL_TABLET | Freq: Four times a day (QID) | ORAL | 0 refills | Status: DC | PRN

## 2019-02-14 NOTE — Telephone Encounter (Signed)
Mearl Latin is triaging patient

## 2019-02-14 NOTE — Telephone Encounter (Signed)
I get the point about needing a refill and I'll work on that but please check back with patient in the meantime and advise about ER eval.  Then please update me. Thanks.

## 2019-02-14 NOTE — Telephone Encounter (Signed)
Please get update on patient tomorrow about his mood, offer virtual/office visit as needed.  I sent his refills in the meantime.

## 2019-02-14 NOTE — Telephone Encounter (Signed)
Pt sent above my chart note;I spoke with pt and he said he has thought about suicide; when asked if he had a plan of how to commit suicide pts response was I have general thoughts of suicide and I have a hectic imagination. Advised pt should go to ED for eval. Pt voiced understanding and said he will go sometime today; pt is at work now. Advised pt if has any thoughts of harming himself or anyone else to go to ED immediately but go today for eval. Pt said he will go to hospital in [facility] that he usually goes to. Pt said rt foot is better but still annoying. Pain level constant 2. Hurts worse when applies pressure on rt foot. Pt wondered if anything else to do. Pt also request refills on hydromorphone 2mg  and hydrocodone apap 5-325 mg. Pt said will be out of med on 02/16/19.  Name of Medication: Hydrocodone apap 5-325 mg Name of Pharmacy: [facility] or Written Date and Quantity:#240 at 01/11/19  Last Office Visit and Type:01/08/21acute& 10/22/20FU  Next Office Visit and Type:none scheduled  Last Controlled Substance Agreement Date: 08/14/2017 Last UDS:08/09/2017   Name of Medication: Hydromorphone 2 mg Name of Pharmacy: [facility] or Written Date and Quantity:#30 at 01/11/19  Last Office Visit and Type:01/08/21acute& 10/22/20FU  Next Office Visit and Type:none scheduled  Last Controlled Substance Agreement Date: 08/14/2017 Last UDS:08/09/2017

## 2019-02-15 NOTE — Telephone Encounter (Signed)
Noted. Thanks.

## 2019-02-15 NOTE — Telephone Encounter (Signed)
I spoke with pt; pt did not go to ED  But spoke with good friends and pt is feeling a lot better today. Offered pt a virtual appt with Dr Damita Dunnings today but pt said he is in a better place today and thinks his pain yesterday caused other issues but is better today. Pt said he would cb if needs to talk. FYI to Dr Damita Dunnings.

## 2019-02-20 ENCOUNTER — Encounter: Payer: Self-pay | Admitting: Family Medicine

## 2019-02-21 ENCOUNTER — Other Ambulatory Visit: Payer: Self-pay | Admitting: Family Medicine

## 2019-02-21 ENCOUNTER — Encounter: Payer: Self-pay | Admitting: Family Medicine

## 2019-02-21 DIAGNOSIS — M109 Gout, unspecified: Secondary | ICD-10-CM

## 2019-02-21 NOTE — Telephone Encounter (Signed)
Electronic refill request. Colchicine Last office visit:  02/01/2019 Last Filled:     30 tablet 1 01/11/2019  Please advise.

## 2019-02-22 ENCOUNTER — Other Ambulatory Visit: Payer: Self-pay | Admitting: Family Medicine

## 2019-02-22 MED ORDER — PREDNISONE 10 MG PO TABS: ORAL_TABLET | ORAL | 0 refills | Status: DC

## 2019-02-22 NOTE — Telephone Encounter (Signed)
Sent. Thanks.   

## 2019-02-27 ENCOUNTER — Encounter: Payer: Self-pay | Admitting: Student in an Organized Health Care Education/Training Program
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
## 2019-02-28 ENCOUNTER — Ambulatory Visit
Payer: Self-pay | Attending: Student in an Organized Health Care Education/Training Program | Admitting: Student in an Organized Health Care Education/Training Program

## 2019-02-28 ENCOUNTER — Other Ambulatory Visit: Payer: Self-pay

## 2019-02-28 ENCOUNTER — Encounter: Payer: Self-pay | Admitting: Student in an Organized Health Care Education/Training Program

## 2019-02-28 ENCOUNTER — Encounter: Payer: Self-pay | Admitting: Family Medicine

## 2019-02-28 VITALS — Ht 72.0 in | Wt 327.0 lb

## 2019-02-28 DIAGNOSIS — G894 Chronic pain syndrome: Secondary | ICD-10-CM

## 2019-02-28 DIAGNOSIS — M5416 Radiculopathy, lumbar region: Secondary | ICD-10-CM

## 2019-02-28 DIAGNOSIS — G8929 Other chronic pain: Secondary | ICD-10-CM

## 2019-02-28 DIAGNOSIS — Z9689 Presence of other specified functional implants: Secondary | ICD-10-CM

## 2019-02-28 DIAGNOSIS — R1032 Left lower quadrant pain: Secondary | ICD-10-CM

## 2019-03-04 ENCOUNTER — Other Ambulatory Visit: Payer: Self-pay | Admitting: Family Medicine

## 2019-03-04 DIAGNOSIS — M79671 Pain in right foot: Secondary | ICD-10-CM

## 2019-03-04 DIAGNOSIS — M109 Gout, unspecified: Secondary | ICD-10-CM

## 2019-03-06 ENCOUNTER — Ambulatory Visit: Payer: Self-pay | Admitting: Family Medicine

## 2019-03-06 ENCOUNTER — Other Ambulatory Visit: Payer: Self-pay

## 2019-03-06 ENCOUNTER — Ambulatory Visit: Payer: Self-pay

## 2019-03-06 ENCOUNTER — Encounter: Payer: Self-pay | Admitting: Family Medicine

## 2019-03-06 DIAGNOSIS — M79674 Pain in right toe(s): Secondary | ICD-10-CM

## 2019-03-06 NOTE — Progress Notes (Signed)
Office Visit Note   Patient: Casey Caldwell           Date of Birth: May 19, 1980           MRN: [id number] Visit Date: 03/06/2019 Requested by: Tonia Ghent, MD [place],  [place] [address] PCP: Tonia Ghent, MD  Subjective: Chief Complaint  Patient presents with  . Right Great Toe - Pain    Pain x 1 & 1/2 months. Got some better with colchicine and antibiotic, but the same symptoms returned 1 week after stopping the medication.    HPI: He is here with right great toe pain.  Symptoms started about a month and a half ago, he bumped his toe against something and then a couple days later it became extremely painful, red and swollen at the first MTP joint.  His doctor put him on colchicine which has helped a little bit, but it did not seem to be going away.  It makes him walk with a limp which affects his chronic back pain.  He has a history of kidney stones.  He is never had gout in his toe.              ROS: No fevers or chills.  All other systems were reviewed and are negative.  Objective: Vital Signs: There were no vitals taken for this visit.  Physical Exam:  General:  Alert and oriented, in no acute distress. Pulm:  Breathing unlabored. Psy:  Normal mood, congruent affect. Skin: No erythema Right great toe: He is very tender to palpation on the dorsum and medial side of the first MTP joint.  Limited dorsiflexion due to pain.  Small effusion.  Imaging: Limited diagnostic ultrasound reveals a double contour sign over the distal first MTP consistent with gout.  There is no hyperemia on power Doppler imaging.  Assessment & Plan: 1.  Right first MTP gout attack, posttraumatic. -Discussed options with him and elected to inject with cortisone today.  Follow-up as needed.     Procedures: Right great toe injection: After sterile prep with Betadine, injected 2 cc 1% lidocaine without epinephrine and 40 mg methylprednisolone from dorsal approach into the  joint recess using ultrasound to guide needle placement.    PMFS History: Patient Active Problem List   Diagnosis Date Noted  . Spinal cord stimulator status 02/28/2019  . Lumbar radiculopathy 02/28/2019  . Gout 01/13/2019  . Rectal pain 07/15/2018  . Plantar fasciitis 08/14/2017  . Encounter for chronic pain management 01/28/2016  . Chronic pain syndrome 10/30/2015  . Adenomatous colon polyp 06/06/2012  . Nephrolithiasis 03/14/2012  . Polycythemia 03/14/2012  . Groin pain, chronic, left 03/14/2012  . RUQ pain 03/14/2012  . Insomnia 03/14/2012  . Seizure disorder ([place]) 03/14/2012  . Anxiety and depression 03/14/2012   Past Medical History:  Diagnosis Date  . Depression    and panic/anxiety  . GERD (gastroesophageal reflux disease)   . History of kidney stones   . Insomnia   . Kidney stones   . Seizures ([place]) Childhood    Family History  Problem Relation Age of Onset  . Stroke Mother   . Liver disease Mother   . Irritable bowel syndrome Mother   . Kidney disease Mother   . Heart disease Father   . Colon polyps Father   . Colon cancer Neg Hx     Past Surgical History:  Procedure Laterality Date  . APPENDECTOMY  2011  . CHOLECYSTECTOMY, LAPAROSCOPIC    .  CYSTOSCOPY/URETEROSCOPY/HOLMIUM LASER Left 06/15/2017   Procedure: CYSTOSCOPY/LEFT URETEROSCOPYSTONE EXTRACTION/LEFT RETROGRADE;  Surgeon: Franchot Gallo, MD;  Location: [facility];  Service: Urology;  Laterality: Left;  With STENT  . HYDROCELE EXCISION Left 06/2012  . LAPAROSCOPIC CHOLECYSTECTOMY  1996  . LITHOTRIPSY     four times over the years; most recent 10/08/12  . ORCHIECTOMY Left 11/2012  . SPINAL CORD STIMULATOR INSERTION  2015  . TONSILLECTOMY  1992  . WRIST GANGLION EXCISION  2002   Right   Social History   Occupational History  . Not on file  Tobacco Use  . Smoking status: Never Smoker  . Smokeless tobacco: Never Used  Substance and Sexual Activity  . Alcohol use: Not Currently     Alcohol/week: 0.0 standard drinks    Comment: rarely  . Drug use: No  . Sexual activity: Not on file

## 2019-03-08 ENCOUNTER — Other Ambulatory Visit: Payer: Self-pay

## 2019-03-08 NOTE — Telephone Encounter (Signed)
Refill request from [facility] for Zolpidem. Last filled on 12/14/2018 #90 with 0 refill. LOV 02/01/2019 for gout issue  No future appointments. Last UDS on 08/09/17

## 2019-03-09 ENCOUNTER — Other Ambulatory Visit: Payer: Self-pay | Admitting: Family Medicine

## 2019-03-11 MED ORDER — ZOLPIDEM TARTRATE 10 MG PO TABS: 10.0000 mg | ORAL_TABLET | Freq: Every evening | ORAL | 0 refills | Status: DC | PRN

## 2019-03-11 NOTE — Telephone Encounter (Signed)
Sent. Thanks.   

## 2019-03-12 ENCOUNTER — Other Ambulatory Visit: Payer: Self-pay

## 2019-03-12 ENCOUNTER — Ambulatory Visit (HOSPITAL_COMMUNITY)
Admission: RE | Admit: 2019-03-12 | Discharge: 2019-03-12 | Disposition: A | Discharge status: Home / Self Care | Payer: No Typology Code available for payment source | Source: Ambulatory Visit | Attending: Student in an Organized Health Care Education/Training Program | Admitting: Student in an Organized Health Care Education/Training Program

## 2019-03-12 DIAGNOSIS — M5416 Radiculopathy, lumbar region: Secondary | ICD-10-CM | POA: Insufficient documentation

## 2019-03-12 DIAGNOSIS — G8929 Other chronic pain: Secondary | ICD-10-CM | POA: Diagnosis present

## 2019-03-12 DIAGNOSIS — Z9689 Presence of other specified functional implants: Secondary | ICD-10-CM | POA: Diagnosis present

## 2019-03-13 ENCOUNTER — Encounter: Payer: Self-pay | Admitting: Student in an Organized Health Care Education/Training Program

## 2019-03-13 ENCOUNTER — Other Ambulatory Visit: Payer: Self-pay | Admitting: Student in an Organized Health Care Education/Training Program

## 2019-03-13 DIAGNOSIS — G8929 Other chronic pain: Secondary | ICD-10-CM

## 2019-03-17 ENCOUNTER — Encounter: Payer: Self-pay | Admitting: Family Medicine

## 2019-03-18 ENCOUNTER — Other Ambulatory Visit: Payer: Self-pay

## 2019-03-18 MED ORDER — PREDNISONE 10 MG PO TABS: ORAL_TABLET | ORAL | 0 refills | Status: DC

## 2019-03-18 MED ORDER — COLCHICINE 0.6 MG PO CAPS: 1.0000 | ORAL_CAPSULE | Freq: Two times a day (BID) | ORAL | 3 refills | Status: DC | PRN

## 2019-03-18 MED ORDER — HYDROCODONE-ACETAMINOPHEN 5-325 MG PO TABS: 1.0000 | ORAL_TABLET | Freq: Four times a day (QID) | ORAL | 0 refills | Status: DC | PRN

## 2019-03-18 MED ORDER — HYDROMORPHONE HCL 2 MG PO TABS: 2.0000 mg | ORAL_TABLET | ORAL | 0 refills | Status: DC | PRN

## 2019-03-18 NOTE — Addendum Note (Signed)
Addended by: Hortencia Pilar on: 03/18/2019 10:16 AM   Modules accepted: Orders

## 2019-03-18 NOTE — Telephone Encounter (Signed)
Sent. Thanks.   

## 2019-03-18 NOTE — Telephone Encounter (Signed)
Name of Medication:Hydrocodone apap 5-325 mg  Name of [place] or Written Date and Quantity:#240 on 02/14/19  Last Office Visit and Type: 02/01/19 gout & 01/11/19 FU Next Office Visit and Type: none scheduled Last Controlled Substance Agreement Date: 08/14/2017 Last UDS:08-14-2017      Name of Medication:Hydromorphone 2 mg  Name of [place] or Written Date and Quantity:#30 on 02/14/19  Last Office Visit and Type: 02/01/19 gout & 01/11/19 FU Next Office Visit and Type: none scheduled Last Controlled Substance Agreement Date: 08/14/2017 Last UDS:14-Aug-2017   Pt said had recent death in family and forgot to call until this morning. Pt is out of medication.

## 2019-03-24 ENCOUNTER — Other Ambulatory Visit: Payer: Self-pay | Admitting: Family Medicine

## 2019-03-25 NOTE — Telephone Encounter (Signed)
Electronic refill request. Aripirazole Last office visit:   02/01/2019 Last Filled:    45 tablet 3 02/08/2018   Please advise.

## 2019-03-25 NOTE — Telephone Encounter (Signed)
Patient left a voicemail stating that he had requested this refill from the pharmacy and wants to make sure Dr. Damita Dunnings got it.

## 2019-03-26 NOTE — Telephone Encounter (Signed)
Sent. Thanks.   

## 2019-04-09 ENCOUNTER — Other Ambulatory Visit: Payer: Self-pay

## 2019-04-09 ENCOUNTER — Ambulatory Visit (HOSPITAL_COMMUNITY)
Admission: RE | Admit: 2019-04-09 | Discharge: 2019-04-09 | Disposition: A | Discharge status: Home / Self Care | Payer: Self-pay | Source: Ambulatory Visit | Attending: Student in an Organized Health Care Education/Training Program | Admitting: Student in an Organized Health Care Education/Training Program

## 2019-04-09 DIAGNOSIS — M533 Sacrococcygeal disorders, not elsewhere classified: Secondary | ICD-10-CM | POA: Insufficient documentation

## 2019-04-09 DIAGNOSIS — G8929 Other chronic pain: Secondary | ICD-10-CM | POA: Insufficient documentation

## 2019-04-10 ENCOUNTER — Other Ambulatory Visit: Payer: Self-pay

## 2019-04-10 ENCOUNTER — Other Ambulatory Visit: Payer: Self-pay | Admitting: Family Medicine

## 2019-04-10 MED ORDER — HYDROCODONE-ACETAMINOPHEN 5-325 MG PO TABS: 1.0000 | ORAL_TABLET | Freq: Four times a day (QID) | ORAL | 0 refills | Status: DC | PRN

## 2019-04-10 MED ORDER — HYDROMORPHONE HCL 2 MG PO TABS: 2.0000 mg | ORAL_TABLET | ORAL | 0 refills | Status: DC | PRN

## 2019-04-10 NOTE — Telephone Encounter (Signed)
Name of Medication:Hydrocodone apap 5-325 mg  Name of [place] or Written Date and Quantity:# 240 on 03/18/19  Last Office Visit and Type: 02/01/19 acute &  11/15/18 pain mgt Next Office Visit and Type: none scheduled Last Controlled Substance Agreement Date: 08/14/17 Last UDS:08/09/17   Name of Medication:Hydromorphone 2 mg Name of [place] or Written Date and Quantity:# 30 on 03/18/19  Last Office Visit and Type: 02/01/19 acute &  11/15/18 pain mgt Next Office Visit and Type: none scheduled Last Controlled Substance Agreement Date: 08/14/17 Last UDS:08/09/17   Pt left v/m that he understands Dr Damita Dunnings is out of the office this week but pt will be out of meds on 04/14/19 and would like to pick up meds this weekend.

## 2019-04-10 NOTE — Telephone Encounter (Signed)
[place] CSRS reviewed Refilled. Will cc PCP as fyi.

## 2019-04-12 ENCOUNTER — Encounter: Payer: Self-pay | Admitting: Student in an Organized Health Care Education/Training Program

## 2019-04-13 NOTE — Telephone Encounter (Signed)
Noted.  Agreed.  Thanks. 

## 2019-04-17 ENCOUNTER — Encounter: Payer: Self-pay | Admitting: Family Medicine

## 2019-04-17 ENCOUNTER — Ambulatory Visit: Payer: Self-pay | Admitting: Student in an Organized Health Care Education/Training Program

## 2019-04-25 ENCOUNTER — Ambulatory Visit: Payer: Self-pay | Admitting: Student in an Organized Health Care Education/Training Program

## 2019-04-28 ENCOUNTER — Encounter: Payer: Self-pay | Admitting: Family Medicine

## 2019-04-29 ENCOUNTER — Encounter: Payer: Self-pay | Admitting: Student in an Organized Health Care Education/Training Program

## 2019-04-29 ENCOUNTER — Ambulatory Visit
Payer: Self-pay | Attending: Student in an Organized Health Care Education/Training Program | Admitting: Student in an Organized Health Care Education/Training Program

## 2019-04-29 ENCOUNTER — Other Ambulatory Visit: Payer: Self-pay

## 2019-04-29 VITALS — BP 138/97 | HR 87 | Temp 97.6°F | Resp 16 | Ht 72.0 in | Wt 325.0 lb

## 2019-04-29 DIAGNOSIS — G8929 Other chronic pain: Secondary | ICD-10-CM | POA: Insufficient documentation

## 2019-04-29 DIAGNOSIS — Z9689 Presence of other specified functional implants: Secondary | ICD-10-CM | POA: Insufficient documentation

## 2019-04-29 DIAGNOSIS — M25551 Pain in right hip: Secondary | ICD-10-CM | POA: Insufficient documentation

## 2019-04-29 DIAGNOSIS — R1031 Right lower quadrant pain: Secondary | ICD-10-CM | POA: Insufficient documentation

## 2019-04-29 DIAGNOSIS — G894 Chronic pain syndrome: Secondary | ICD-10-CM | POA: Insufficient documentation

## 2019-04-29 MED ORDER — ALLOPURINOL 100 MG PO TABS: 100.0000 mg | ORAL_TABLET | Freq: Every day | ORAL | 6 refills | Status: DC

## 2019-04-29 NOTE — Progress Notes (Signed)
Safety precautions to be maintained throughout the outpatient stay will include: orient to surroundings, keep bed in low position, maintain call bell within reach at all times, provide assistance with transfer out of bed and ambulation.  
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
## 2019-04-29 NOTE — Addendum Note (Signed)
Addended by: Hortencia Pilar on: 04/29/2019 09:17 AM   Modules accepted: Orders

## 2019-05-02 IMAGING — CT CT RENAL STONE PROTOCOL
2 of 4 series · 17 of 46 positions shown, 19 images · non-contrast
Comparison: Multiple prior exams most recently ultrasound
06/12/2017. Most recent CT 04/16/2017

CLINICAL DATA: Left flank pain for 2 days.

EXAM:
CT ABDOMEN AND PELVIS WITHOUT CONTRAST
TECHNIQUE: Multidetector CT imaging of the abdomen and pelvis was performed
following the standard protocol without IV contrast.

[Series 2: axial st · axial · 0.97mm/px · z∈[-588,-18]mm · 14 of 126 slices shown, 16 images]
[im 6/126  soft-tissue]
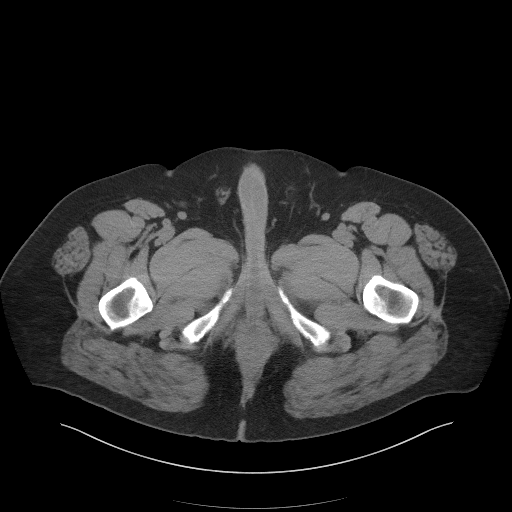
[im 6/126  bone]
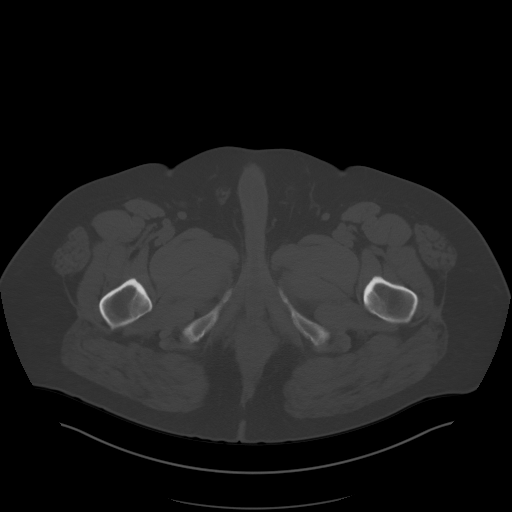
[im 17/126  soft-tissue]
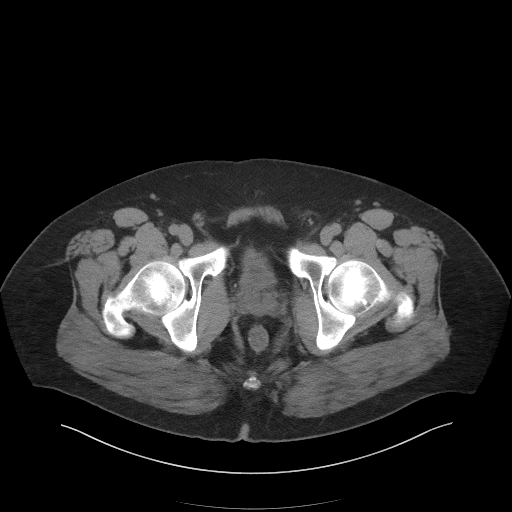
[im 22/126  soft-tissue]
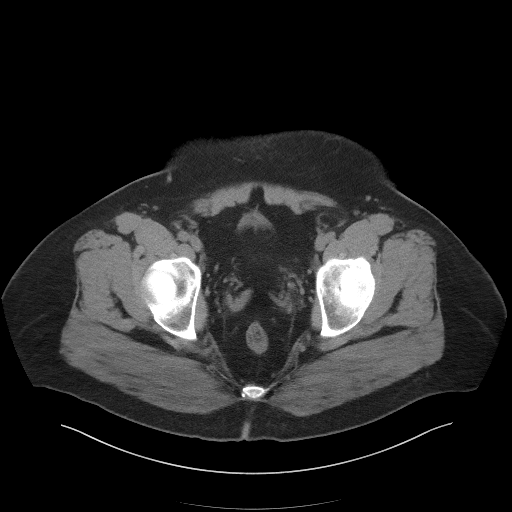
[im 33/126  soft-tissue]
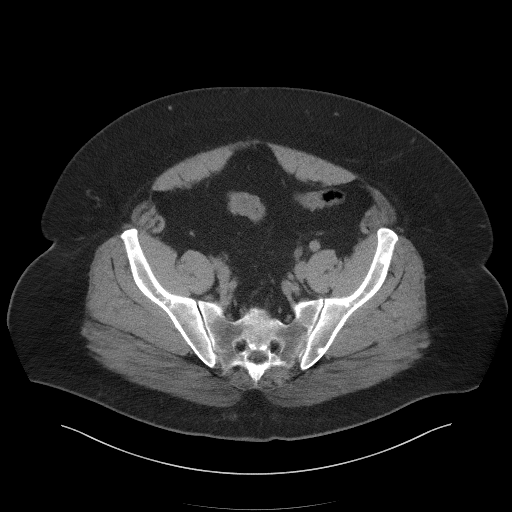
[im 44/126  soft-tissue]
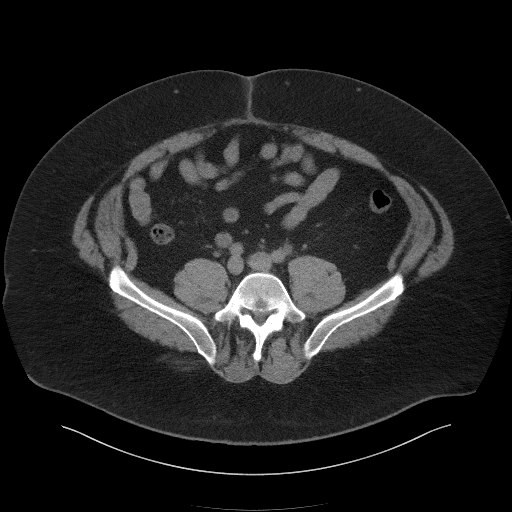
[im 49/126  soft-tissue]
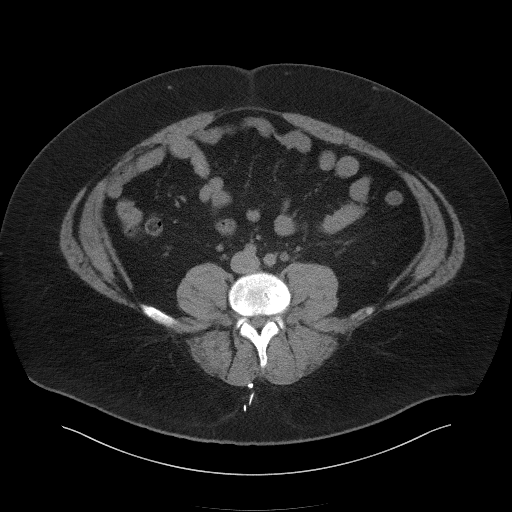
[im 60/126  soft-tissue]
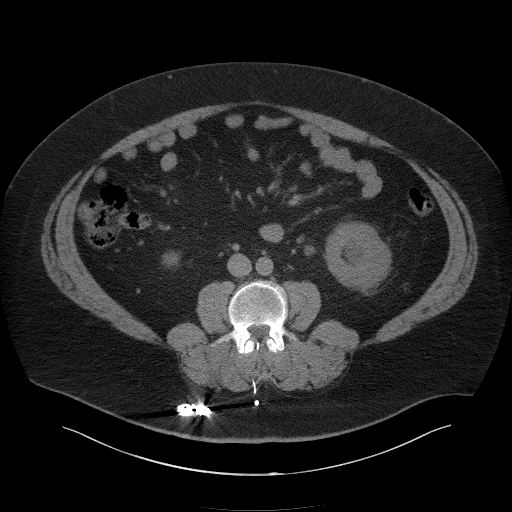
[im 66/126  soft-tissue]
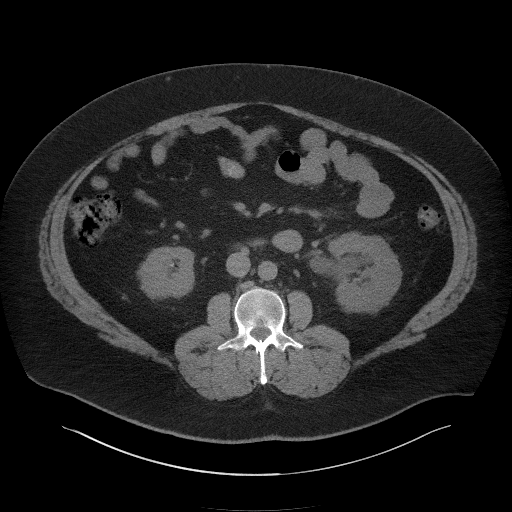
[im 77/126  soft-tissue]
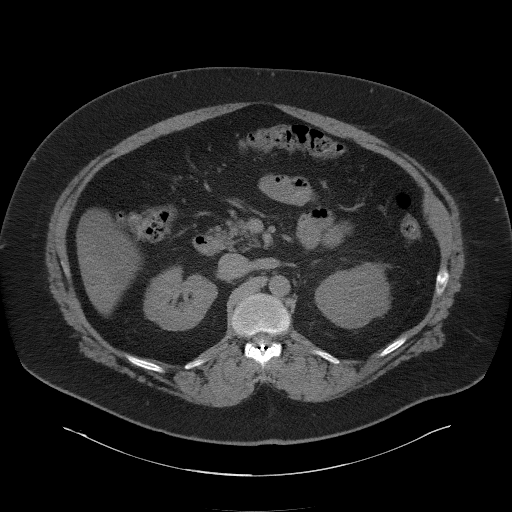
[im 77/126  bone]
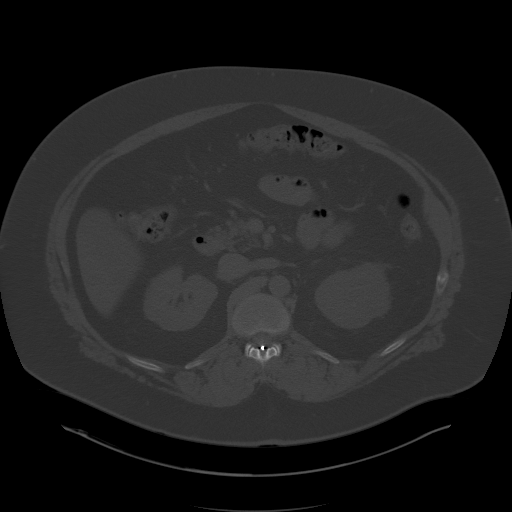
[im 82/126  soft-tissue]
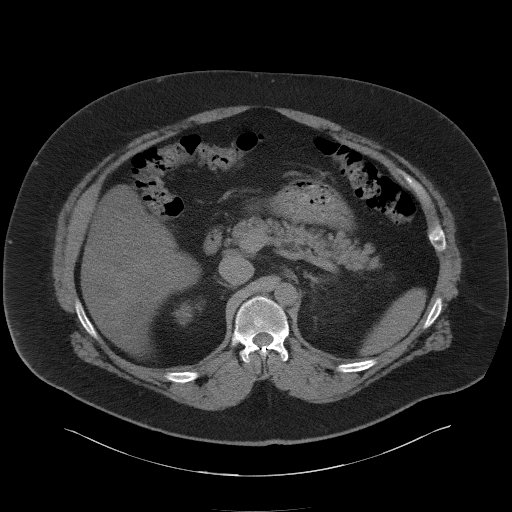
[im 93/126  soft-tissue]
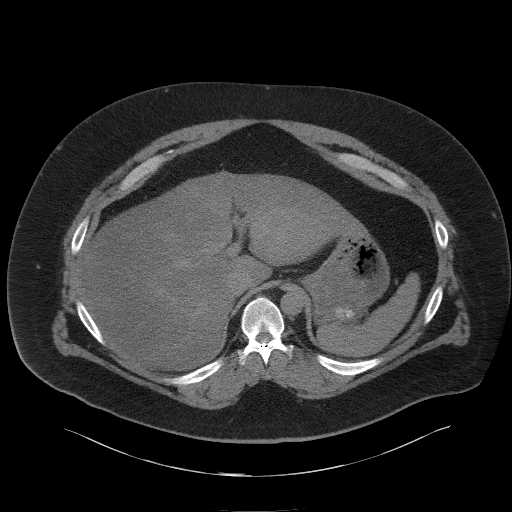
[im 104/126  soft-tissue]
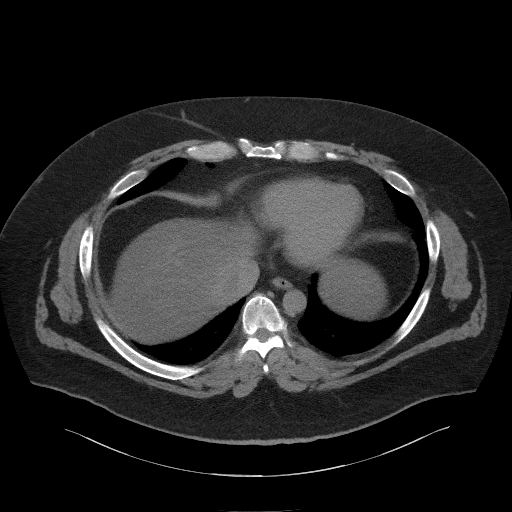
[im 109/126  soft-tissue]
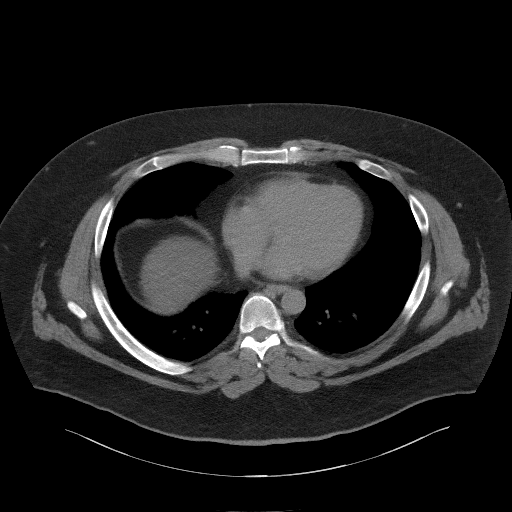
[im 120/126  soft-tissue]
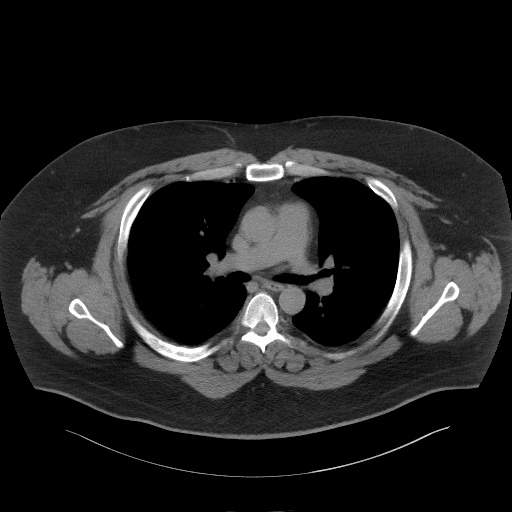

[Series 5: coronal st · coronal · 1.18mm/px · 3 of 118 slices shown]
[im 40/118  soft-tissue]
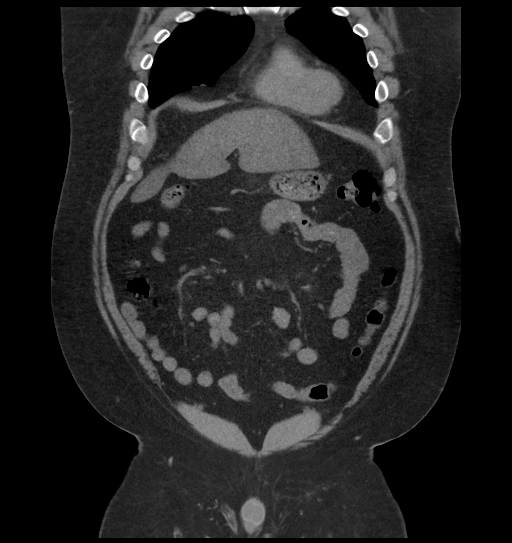
[im 53/118  soft-tissue]
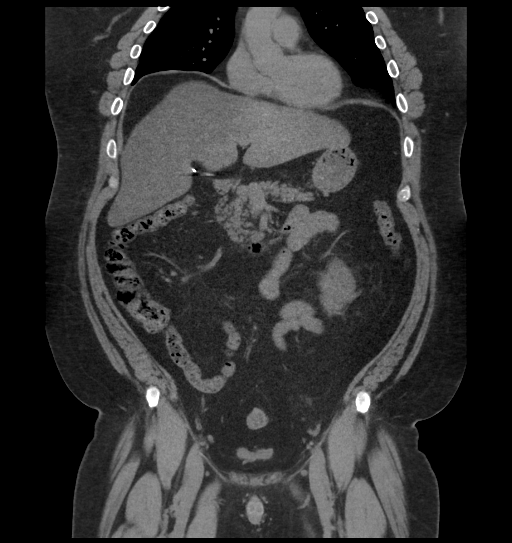
[im 66/118  soft-tissue]
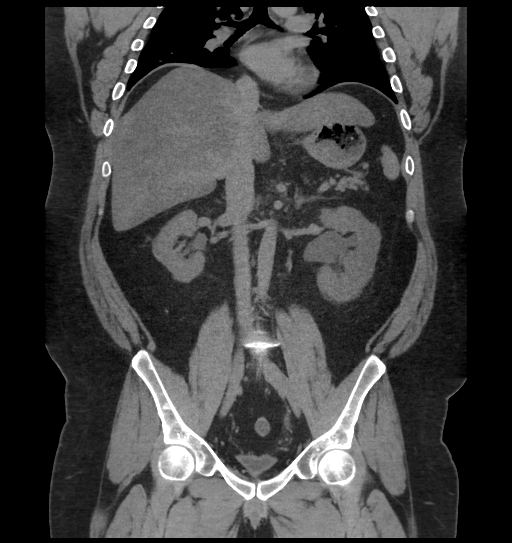

[17 of 46 positions shown; findings below may reference images not displayed]

FINDINGS: Lower chest: Lung bases are clear.

Hepatobiliary: Again seen hepatic steatosis. No discrete focal
lesion. Clips in the gallbladder fossa postcholecystectomy. No
biliary dilatation.

Pancreas: No ductal dilatation or inflammation.

Spleen: Normal in size without focal abnormality.

Adrenals/Urinary Tract: Normal adrenal glands. Obstructing 5 x 8 mm
stone in the distal left ureter with moderate proximal
hydroureteronephrosis and perinephric edema. Punctate nonobstructing
stones in the upper left kidney. No right hydronephrosis.
Nonobstructing stone in the lower right kidney. Urinary bladder is
decompressed without stone.

Stomach/Bowel: Stomach is within normal limits. Appendix surgically
absent. No evidence of bowel wall thickening, distention, or
inflammatory changes.

Vascular/Lymphatic: Normal caliber abdominal vessels. No adenopathy.

Reproductive: Prostate is unremarkable.

Other: Small fat containing umbilical hernia. No free air or
ascites.

Musculoskeletal: Spinal stimulator in place. There are no acute or
suspicious osseous abnormalities.
IMPRESSION: 1. Obstructing 5 x 8 mm stone in the distal left ureter with
moderate hydroureteronephrosis.
2. Bilateral nonobstructing renal calculi.
3. Hepatic steatosis.

## 2019-05-06 ENCOUNTER — Other Ambulatory Visit: Payer: Self-pay

## 2019-05-06 ENCOUNTER — Ambulatory Visit (HOSPITAL_COMMUNITY)
Admission: RE | Admit: 2019-05-06 | Discharge: 2019-05-06 | Disposition: A | Discharge status: Home / Self Care | Payer: Self-pay | Source: Ambulatory Visit | Attending: Student in an Organized Health Care Education/Training Program | Admitting: Student in an Organized Health Care Education/Training Program

## 2019-05-06 DIAGNOSIS — G894 Chronic pain syndrome: Secondary | ICD-10-CM | POA: Insufficient documentation

## 2019-05-06 DIAGNOSIS — M25551 Pain in right hip: Secondary | ICD-10-CM | POA: Insufficient documentation

## 2019-05-07 ENCOUNTER — Ambulatory Visit: Payer: Self-pay | Admitting: Orthopaedic Surgery

## 2019-05-07 ENCOUNTER — Encounter: Payer: Self-pay | Admitting: Orthopaedic Surgery

## 2019-05-07 VITALS — Ht 72.0 in | Wt 325.0 lb

## 2019-05-07 DIAGNOSIS — M25551 Pain in right hip: Secondary | ICD-10-CM
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
## 2019-05-09 ENCOUNTER — Encounter: Payer: Self-pay | Admitting: Family Medicine

## 2019-05-09 NOTE — Telephone Encounter (Signed)
Patient is asking if Dr. Damita Dunnings will take over prescribing his Urology medicines:  1) Potassium Citrate ER 10 mEq Take 1 tablet BID  And 2) Indapamide 2.5 mg Take 1 tablet daily.  To Casey Caldwell

## 2019-05-13 ENCOUNTER — Ambulatory Visit: Payer: Self-pay

## 2019-05-13 ENCOUNTER — Other Ambulatory Visit: Payer: Self-pay

## 2019-05-13 ENCOUNTER — Encounter: Payer: Self-pay | Admitting: Family Medicine

## 2019-05-13 ENCOUNTER — Ambulatory Visit (INDEPENDENT_AMBULATORY_CARE_PROVIDER_SITE_OTHER): Payer: Self-pay | Admitting: Family Medicine

## 2019-05-13 DIAGNOSIS — M25551 Pain in right hip: Secondary | ICD-10-CM
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
## 2019-05-14 ENCOUNTER — Other Ambulatory Visit: Payer: Self-pay | Admitting: *Deleted

## 2019-05-14 NOTE — Telephone Encounter (Signed)
Patient left a voicemail stating that it is time for a refill on his two pain medications  Name of Medication: Hydrocodone and Dilaudid [facility] Last Fill or Written Date and Quantity: LR 04/10/19 Hydrocodone #240- Dilaudid #30 Last Office Visit and Type: 02/01/19 Next Office Visit and Type: None scheduled Last Controlled Substance Agreement Date: 08/14/17 Last UDS: 08/09/17

## 2019-05-15 MED ORDER — HYDROCODONE-ACETAMINOPHEN 5-325 MG PO TABS: 1.0000 | ORAL_TABLET | Freq: Four times a day (QID) | ORAL | 0 refills | Status: DC | PRN

## 2019-05-15 MED ORDER — HYDROMORPHONE HCL 2 MG PO TABS: 2.0000 mg | ORAL_TABLET | ORAL | 0 refills | Status: DC | PRN

## 2019-05-15 NOTE — Telephone Encounter (Signed)
Sent. Thanks.   

## 2019-05-17 ENCOUNTER — Other Ambulatory Visit: Payer: Self-pay | Admitting: Family Medicine

## 2019-05-17 MED ORDER — INDAPAMIDE 2.5 MG PO TABS: 2.5000 mg | ORAL_TABLET | Freq: Every day | ORAL | 1 refills | Status: DC

## 2019-05-17 MED ORDER — POTASSIUM CITRATE ER 10 MEQ (1080 MG) PO TBCR: 10.0000 meq | EXTENDED_RELEASE_TABLET | Freq: Two times a day (BID) | ORAL | 1 refills | Status: DC

## 2019-05-22 ENCOUNTER — Encounter: Payer: Self-pay | Admitting: Orthopaedic Surgery

## 2019-05-22 NOTE — Telephone Encounter (Signed)
Please have patient return to office for eval

## 2019-05-28 ENCOUNTER — Other Ambulatory Visit: Payer: Self-pay

## 2019-05-28 ENCOUNTER — Ambulatory Visit (INDEPENDENT_AMBULATORY_CARE_PROVIDER_SITE_OTHER): Payer: Self-pay | Admitting: Orthopaedic Surgery

## 2019-05-28 DIAGNOSIS — M5416 Radiculopathy, lumbar region: Secondary | ICD-10-CM

## 2019-05-28 NOTE — Progress Notes (Signed)
Office Visit Note   Patient: Casey Caldwell           Date of Birth: 03-02-1980           MRN: [id number] Visit Date: 05/28/2019              Requested by: Tonia Ghent, MD [address] [place],  [place] [address] PCP: Tonia Ghent, MD   Assessment & Plan: Visit Diagnoses:  1. Lumbar radiculopathy     Plan: Impression is chronic low back pain and right lumbar radiculopathy.  At this point patient would like to try another round of lumbar spine ESI's.  We will refer patient to Dr. Ernestina Patches for these.  I also talked about the importance of weight loss and how this will affect chronic low back pain and facet disease.  I can see him back in the office as needed.  Follow-Up Instructions: Return if symptoms worsen or fail to improve.   Orders:  Orders Placed This Encounter  Procedures  . Ambulatory referral to Physical Medicine Rehab   No orders of the defined types were placed in this encounter.     Procedures: No procedures performed   Clinical Data: No additional findings.   Subjective: Chief Complaint  Patient presents with  . Lower Back - Pain    Casey Caldwell returns today for continued low back pain.  He is severely limited by this.  He states he has 8/10 pain that is worse with movement.  He has less pain when sitting.  He is taking hydrocodone and hydromorphone prescribed by his PCP.  He is radiating pain with tingling numbness down his legs.  He is currently not in pain management.   Review of Systems  Constitutional: Negative.   All other systems reviewed and are negative.    Objective: Vital Signs: There were no vitals taken for this visit.  Physical Exam Vitals and nursing note reviewed.  Constitutional:      Appearance: He is well-developed.  Pulmonary:     Effort: Pulmonary effort is normal.  Abdominal:     Palpations: Abdomen is soft.  Skin:    General: Skin is warm.  Neurological:     Mental Status: He is alert and oriented to person,  place, and time.  Psychiatric:        Behavior: Behavior normal.        Thought Content: Thought content normal.        Judgment: Judgment normal.     Ortho Exam Lumbar spine exam is nonfocal.  He has tenderness along the lumbar spine and the paraspinal muscles more so on the right side. Specialty Comments:  No specialty comments available.  Imaging: No results found.   PMFS History: Patient Active Problem List   Diagnosis Date Noted  . Chronic hip pain, right 04/29/2019  . Right groin pain 04/29/2019  . Spinal cord stimulator status 02/28/2019  . Lumbar radiculopathy 02/28/2019  . Gout 01/13/2019  . Rectal pain 07/15/2018  . Plantar fasciitis 08/14/2017  . Encounter for chronic pain management 01/28/2016  . Chronic pain syndrome 10/30/2015  . Adenomatous colon polyp 06/06/2012  . Nephrolithiasis 03/14/2012  . Polycythemia 03/14/2012  . Groin pain, chronic, left 03/14/2012  . RUQ pain 03/14/2012  . Insomnia 03/14/2012  . Seizure disorder ([place]) 03/14/2012  . Anxiety and depression 03/14/2012   Past Medical History:  Diagnosis Date  . Depression    and panic/anxiety  . GERD (gastroesophageal reflux disease)   .  History of kidney stones   . Insomnia   . Kidney stones   . Seizures ([place]) Childhood    Family History  Problem Relation Age of Onset  . Stroke Mother   . Liver disease Mother   . Irritable bowel syndrome Mother   . Kidney disease Mother   . Heart disease Father   . Colon polyps Father   . Colon cancer Neg Hx     Past Surgical History:  Procedure Laterality Date  . APPENDECTOMY  2011  . CHOLECYSTECTOMY, LAPAROSCOPIC    . CYSTOSCOPY/URETEROSCOPY/HOLMIUM LASER Left 06/15/2017   Procedure: CYSTOSCOPY/LEFT URETEROSCOPYSTONE EXTRACTION/LEFT RETROGRADE;  Surgeon: Franchot Gallo, MD;  Location: [facility];  Service: Urology;  Laterality: Left;  With STENT  . HYDROCELE EXCISION Left 06/2012  . LAPAROSCOPIC CHOLECYSTECTOMY  1996  . LITHOTRIPSY     four  times over the years; most recent 10/08/12  . ORCHIECTOMY Left 11/2012  . SPINAL CORD STIMULATOR INSERTION  2015  . TONSILLECTOMY  1992  . WRIST GANGLION EXCISION  2002   Right   Social History   Occupational History  . Not on file  Tobacco Use  . Smoking status: Never Smoker  . Smokeless tobacco: Never Used  Substance and Sexual Activity  . Alcohol use: Not Currently    Alcohol/week: 0.0 standard drinks    Comment: rarely  . Drug use: No  . Sexual activity: Not on file

## 2019-05-31 ENCOUNTER — Other Ambulatory Visit: Payer: Self-pay | Admitting: Family Medicine

## 2019-05-31 ENCOUNTER — Telehealth: Payer: Self-pay | Admitting: Family Medicine

## 2019-05-31 NOTE — Telephone Encounter (Signed)
Electronic refill request. Zolpidem Last office visit:   02/01/2019 Last Filled:    90 tablet 0 03/11/2019  Please advise.

## 2019-05-31 NOTE — Telephone Encounter (Signed)
Sent. Thanks.   

## 2019-06-03 MED ORDER — ZOLPIDEM TARTRATE 10 MG PO TABS: 10.0000 mg | ORAL_TABLET | Freq: Every evening | ORAL | 0 refills | Status: DC | PRN

## 2019-06-03 NOTE — Telephone Encounter (Signed)
Patient called today and stated the pharmacy did not receive the refill and would like it resent

## 2019-06-03 NOTE — Telephone Encounter (Signed)
Rx phoned to pharmacy.  

## 2019-06-03 NOTE — Telephone Encounter (Addendum)
I tried to resend [place] but it would not go through. Please call in. Thanks.

## 2019-06-03 NOTE — Addendum Note (Signed)
Addended by: Tonia Ghent on: 06/03/2019 05:49 PM   Modules accepted: Orders

## 2019-06-11 ENCOUNTER — Other Ambulatory Visit: Payer: Self-pay | Admitting: Family Medicine
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
## 2019-06-11 NOTE — Telephone Encounter (Signed)
Patient called today requesting a refill    HYDROcodone-acetaminophen (NORCO/VICODIN) 5-325 MG tablet    HYDROmorphone (DILAUDID) 2 MG tablet     Patient stated he has enough to get him until his monthly due date   [place]

## 2019-06-12 MED ORDER — HYDROMORPHONE HCL 2 MG PO TABS: 2.0000 mg | ORAL_TABLET | ORAL | 0 refills | Status: DC | PRN

## 2019-06-12 MED ORDER — HYDROCODONE-ACETAMINOPHEN 5-325 MG PO TABS: 1.0000 | ORAL_TABLET | Freq: Four times a day (QID) | ORAL | 0 refills | Status: DC | PRN

## 2019-06-12 NOTE — Telephone Encounter (Signed)
Sent.  My understanding is that he was going to follow-up with Dr. Ernestina Patches about a repeat epidural steroid injection.  One is that going to happen?  Please let me know.  Thanks.

## 2019-06-13 NOTE — Telephone Encounter (Signed)
Noted.  Will await that.  Thanks.

## 2019-06-13 NOTE — Telephone Encounter (Signed)
Patient says Dr. Romona Curls office has not called yet to set up the appointment.

## 2019-06-27 ENCOUNTER — Encounter: Payer: Self-pay | Admitting: Family Medicine

## 2019-06-27 ENCOUNTER — Encounter: Payer: Self-pay | Admitting: Orthopaedic Surgery

## 2019-06-27 ENCOUNTER — Encounter: Payer: Self-pay | Admitting: Student in an Organized Health Care Education/Training Program

## 2019-06-27 NOTE — Telephone Encounter (Signed)
Please call in to the office and ask to schedule a MyChart Video Visit.

## 2019-06-28 ENCOUNTER — Other Ambulatory Visit: Payer: Self-pay

## 2019-06-28 DIAGNOSIS — M5416 Radiculopathy, lumbar region: Secondary | ICD-10-CM

## 2019-06-28 NOTE — Telephone Encounter (Signed)
If his pain doctor and Dr. Ernestina Patches both don't feel there's anything they can offer then we should refer him to neurosurgery.  Thanks.

## 2019-07-02 ENCOUNTER — Telehealth (INDEPENDENT_AMBULATORY_CARE_PROVIDER_SITE_OTHER): Payer: Self-pay | Admitting: Family Medicine

## 2019-07-02 ENCOUNTER — Encounter: Payer: Self-pay | Admitting: Family Medicine

## 2019-07-02 ENCOUNTER — Other Ambulatory Visit: Payer: Self-pay

## 2019-07-02 VITALS — Ht 72.0 in | Wt 341.0 lb

## 2019-07-02 DIAGNOSIS — M109 Gout, unspecified: Secondary | ICD-10-CM

## 2019-07-02 DIAGNOSIS — N200 Calculus of kidney: Secondary | ICD-10-CM

## 2019-07-02 DIAGNOSIS — G894 Chronic pain syndrome: Secondary | ICD-10-CM

## 2019-07-02 DIAGNOSIS — G8929 Other chronic pain: Secondary | ICD-10-CM

## 2019-07-02 DIAGNOSIS — F32A Depression, unspecified: Secondary | ICD-10-CM

## 2019-07-02 DIAGNOSIS — F329 Major depressive disorder, single episode, unspecified: Secondary | ICD-10-CM

## 2019-07-02 DIAGNOSIS — F419 Anxiety disorder, unspecified: Secondary | ICD-10-CM
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
## 2019-07-03 NOTE — Assessment & Plan Note (Signed)
Chronic pain, in spite of stimulator placement and multiple previous injections with neurosurgery evaluation pending.  I want their input about his back pain.  He agrees.  No change in meds for now.  Routine sedation caution given all his current meds.  [employer] reviewed.  No red flags.  Not yet due for refills.  Continue work on diet and weight and exercise as tolerated.  It is reasonable for him to try to exercise in a pool if that helps his joint pain.

## 2019-07-03 NOTE — Assessment & Plan Note (Signed)
  D/w pt about recheck BMET/uric acid/UDS when possible.  Would continue indapamide for now.  He has had less emergency room visits related to kidney stones with starting that medication.

## 2019-07-03 NOTE — Assessment & Plan Note (Signed)
Mood discussed with patient.  He is adjusting to his new social situation and is still okay for outpatient follow-up.

## 2019-07-04 ENCOUNTER — Other Ambulatory Visit: Payer: Self-pay

## 2019-07-05 ENCOUNTER — Other Ambulatory Visit: Payer: Self-pay

## 2019-07-15 ENCOUNTER — Telehealth: Payer: Self-pay

## 2019-07-15 NOTE — Telephone Encounter (Signed)
Patient contacted the office and states he needs refill on his Hydrocodone and Hydromorphone.  Hydrocodone was last refilled on 06/12/19 for #240.  Hydromorphone was last refilled 06/12/19 for #30.  Patient was last seen for e-visit on 07/02/19 and has no upcoming appts. Ok to refill?

## 2019-07-16 ENCOUNTER — Other Ambulatory Visit: Payer: Self-pay | Admitting: *Deleted

## 2019-07-16 ENCOUNTER — Telehealth: Payer: Self-pay

## 2019-07-16 MED ORDER — HYDROMORPHONE HCL 2 MG PO TABS: 2.0000 mg | ORAL_TABLET | ORAL | 0 refills | Status: DC | PRN

## 2019-07-16 MED ORDER — HYDROCODONE-ACETAMINOPHEN 5-325 MG PO TABS: 1.0000 | ORAL_TABLET | Freq: Four times a day (QID) | ORAL | 0 refills | Status: DC | PRN

## 2019-07-16 MED ORDER — ESCITALOPRAM OXALATE 20 MG PO TABS: 20.0000 mg | ORAL_TABLET | Freq: Every day | ORAL | 3 refills | Status: DC

## 2019-07-16 NOTE — Telephone Encounter (Signed)
Refill of Lexapro sent.

## 2019-07-16 NOTE — Telephone Encounter (Signed)
[facility] Night - Client TELEPHONE ADVICE RECORD [facility] Patient Name: Casey Caldwell Gender: Male DOB: 1980/10/29 Age: 39 Y 2 M 12 D Return Phone Number: [phone] (Primary) Address: City/State/Zip: [place] [address] Client [facility] - Client Client Site [facility] - PHYSICIAN, NOT LISTED- MD Contact Type Call Who Is Calling Patient / Member / Family / Caregiver Call Type Triage / Clinical Relationship To Patient Self Return Phone Number [phone] (Primary) Chief Complaint Prescription Refill or Medication Request (non symptomatic) Reason for Call Medication Question / Request Initial Comment Caller stated that he needs a prescription refill for Escitalopram. Caller stated their physician is Dr. Elsie Stain. Translation No Nurse Assessment Nurse: Laurena Bering, RN, Helene Kelp Date/Time Eilene Ghazi Time): 07/15/2019 7:16:07 PM Confirm and document reason for call. If symptomatic, describe symptoms. ---Caller states that he needs 90 day supply of escitalopram 20 mg daily sent to [facility]. No symptoms Has the patient had close contact with a person known or suspected to have the novel coronavirus illness OR traveled / lives in area with major community spread (including international travel) in the last 14 days from the onset of symptoms? * If Asymptomatic, screen for exposure and travel within the last 14 days. ---No Does the patient have any new or worsening symptoms? ---No Guidelines Guideline Title Affirmed Question Affirmed Notes Nurse Date/Time (Eastern Time) Disp. Time Eilene Ghazi Time) Disposition Final User 07/15/2019 7:06:13 PM Send To RN Personal Sherol Dade, RN, Kaila 07/15/2019 7:17:04 PM Clinical Call Yes Laurena Bering, RN, Helene Kelp

## 2019-07-16 NOTE — Telephone Encounter (Signed)
Sent. Thanks.   

## 2019-07-16 NOTE — Telephone Encounter (Signed)
Patient called back to check status  He stated he is also needing escitalopram (LEXAPRO)

## 2019-07-17 ENCOUNTER — Other Ambulatory Visit: Payer: Self-pay

## 2019-07-23 ENCOUNTER — Other Ambulatory Visit: Payer: Self-pay | Admitting: Family Medicine

## 2019-07-25 ENCOUNTER — Other Ambulatory Visit: Payer: Self-pay | Admitting: *Deleted

## 2019-07-25 MED ORDER — ESCITALOPRAM OXALATE 20 MG PO TABS: 20.0000 mg | ORAL_TABLET | Freq: Every day | ORAL | 3 refills | Status: DC

## 2019-08-05 ENCOUNTER — Other Ambulatory Visit (INDEPENDENT_AMBULATORY_CARE_PROVIDER_SITE_OTHER): Payer: Self-pay

## 2019-08-05 ENCOUNTER — Other Ambulatory Visit: Payer: Self-pay

## 2019-08-05 ENCOUNTER — Encounter: Payer: Self-pay | Admitting: Family Medicine

## 2019-08-05 DIAGNOSIS — M109 Gout, unspecified: Secondary | ICD-10-CM

## 2019-08-05 DIAGNOSIS — G894 Chronic pain syndrome: Secondary | ICD-10-CM

## 2019-08-05 LAB — BASIC METABOLIC PANEL
BUN: 10 mg/dL (ref 6–23)
CO2: 33 mEq/L — ABNORMAL HIGH (ref 19–32)
Calcium: 10.7 mg/dL — ABNORMAL HIGH (ref 8.4–10.5)
Chloride: 99 mEq/L (ref 96–112)
Creatinine, Ser: 1.07 mg/dL (ref 0.40–1.50)
GFR: 76.06 mL/min (ref >60.00)
Glucose, Bld: 102 mg/dL — ABNORMAL HIGH (ref 70–99)
Potassium: 3.9 mEq/L (ref 3.5–5.1)
Sodium: 141 mEq/L (ref 135–145)

## 2019-08-05 LAB — URIC ACID: Uric Acid, Serum: 7.9 mg/dL — ABNORMAL HIGH (ref 4.0–7.8)

## 2019-08-08 LAB — DRUG MONITORING, PANEL 8 WITH CONFIRMATION, URINE
6 Acetylmorphine: NEGATIVE ng/mL (ref <10)
Alcohol Metabolites: NEGATIVE ng/mL
Amphetamines: NEGATIVE ng/mL (ref <500)
Benzodiazepines: NEGATIVE ng/mL (ref <100)
Buprenorphine, Urine: NEGATIVE ng/mL (ref <5)
Cocaine Metabolite: NEGATIVE ng/mL (ref <150)
Codeine: NEGATIVE ng/mL (ref <50)
Creatinine: 227.4 mg/dL
Hydrocodone: 1460 ng/mL — ABNORMAL HIGH (ref <50)
Hydromorphone: 611 ng/mL — ABNORMAL HIGH (ref <50)
MDMA: NEGATIVE ng/mL (ref <500)
Marijuana Metabolite: NEGATIVE ng/mL (ref <20)
Morphine: NEGATIVE ng/mL (ref <50)
Norhydrocodone: 3619 ng/mL — ABNORMAL HIGH (ref <50)
Opiates: POSITIVE ng/mL — AB (ref <100)
Oxidant: NEGATIVE ug/mL
Oxycodone: NEGATIVE ng/mL (ref <100)
pH: 6.1 (ref 4.5–9.0)

## 2019-08-08 LAB — DM TEMPLATE

## 2019-08-12 ENCOUNTER — Other Ambulatory Visit: Payer: Self-pay

## 2019-08-12 ENCOUNTER — Encounter: Payer: Self-pay | Admitting: Family Medicine

## 2019-08-12 MED ORDER — PREDNISONE 10 MG PO TABS: ORAL_TABLET | ORAL | 0 refills | Status: DC

## 2019-08-12 NOTE — Telephone Encounter (Signed)
Patient contacted the office requesting a refill on his Hydromorphone and his Hydrocodone.  Hydromorphone was last refilled 07/16/19 for #30 with 0 refills.  Hydrocodone was last refilled on 07/16/19 for #240 tablets with 0 refills.  Patient was last seen for a video visit on 07/02/19 and has no upcoming appts.  Is this ok to refill?

## 2019-08-13 MED ORDER — HYDROMORPHONE HCL 2 MG PO TABS: 2.0000 mg | ORAL_TABLET | ORAL | 0 refills | Status: DC | PRN

## 2019-08-13 MED ORDER — PREDNISONE 10 MG PO TABS: ORAL_TABLET | ORAL | 0 refills | Status: DC

## 2019-08-13 MED ORDER — HYDROCODONE-ACETAMINOPHEN 5-325 MG PO TABS: 1.0000 | ORAL_TABLET | Freq: Four times a day (QID) | ORAL | 0 refills | Status: DC | PRN

## 2019-08-13 NOTE — Addendum Note (Signed)
Addended by: Hortencia Pilar on: 08/13/2019 12:13 PM   Modules accepted: Orders

## 2019-08-13 NOTE — Telephone Encounter (Signed)
Okay to continue.  Sent.  Thanks. 

## 2019-08-30 ENCOUNTER — Encounter: Payer: Self-pay | Admitting: Family Medicine

## 2019-09-03 ENCOUNTER — Other Ambulatory Visit: Payer: Self-pay | Admitting: Family Medicine

## 2019-09-03 MED ORDER — ZOLPIDEM TARTRATE 10 MG PO TABS: 10.0000 mg | ORAL_TABLET | Freq: Every evening | ORAL | 0 refills | Status: DC | PRN

## 2019-09-11 DIAGNOSIS — G8929 Other chronic pain: Secondary | ICD-10-CM | POA: Insufficient documentation

## 2019-09-15 ENCOUNTER — Other Ambulatory Visit: Payer: Self-pay | Admitting: Family Medicine

## 2019-09-15 DIAGNOSIS — R7989 Other specified abnormal findings of blood chemistry: Secondary | ICD-10-CM

## 2019-09-17 ENCOUNTER — Other Ambulatory Visit: Payer: Self-pay | Admitting: *Deleted

## 2019-09-17 NOTE — Telephone Encounter (Signed)
Patient left a voicemail requesting refills Name of Medication: Hydrocodone, Dilaudid Name of Pharmacy: [facility] or Written Date and Quantity: Hydrocodone 08/13/19 #240, Dilaudid 08/13/19 #30 Last Office Visit and Type: 07/02/19 Video Next Office Visit and Type: none scheduled Last Controlled Substance Agreement Date: 03/04/16 Last UDS: 08/05/19

## 2019-09-18 MED ORDER — HYDROMORPHONE HCL 2 MG PO TABS: 2.0000 mg | ORAL_TABLET | ORAL | 0 refills | Status: DC | PRN

## 2019-09-18 MED ORDER — HYDROCODONE-ACETAMINOPHEN 5-325 MG PO TABS: 1.0000 | ORAL_TABLET | Freq: Four times a day (QID) | ORAL | 0 refills | Status: DC | PRN

## 2019-09-18 NOTE — Telephone Encounter (Signed)
Sent. Thanks.   

## 2019-10-11 ENCOUNTER — Other Ambulatory Visit: Payer: Self-pay | Admitting: Family Medicine

## 2019-10-11 NOTE — Telephone Encounter (Signed)
Name of Medication: Hydrocodone Name of Pharmacy: [facility] Written Date and Quantity: 09/18/19 #240 Last Office Visit and Type: 07-02-19 Next Office Visit and Type: No Future OV Last Controlled Substance Agreement Date: 08-14-17 Last UDS: 08-05-19  Name of Medication: Dilaudid Last Written Date and Quantity: 09/18/19 #30

## 2019-10-13 MED ORDER — HYDROCODONE-ACETAMINOPHEN 5-325 MG PO TABS: 1.0000 | ORAL_TABLET | Freq: Four times a day (QID) | ORAL | 0 refills | Status: DC | PRN

## 2019-10-13 MED ORDER — HYDROMORPHONE HCL 2 MG PO TABS: 2.0000 mg | ORAL_TABLET | ORAL | 0 refills | Status: DC | PRN

## 2019-10-13 NOTE — Telephone Encounter (Signed)
Sent. Thanks.  Needs OV scheduled.

## 2019-10-14 NOTE — Telephone Encounter (Signed)
Left detailed message on voicemail to call in to schedule OV.

## 2019-11-01 ENCOUNTER — Encounter: Payer: Self-pay | Admitting: Family Medicine

## 2019-11-01 LAB — LAB REPORT - SCANNED
ALT: 64 — AB (ref 3–30)
AST: 46
Alkaline Phosphatase: 65
Calcium: 10.5
Cholesterol: 209
Creatinine, Ser: 1.2
HDL: 46
Hemoglobin: 17.6
LDL (calc): 117
Triglycerides: 266 — AB (ref 40–160)
eGFR: 67

## 2019-11-08 ENCOUNTER — Encounter: Payer: Self-pay | Admitting: Family Medicine

## 2019-11-08 LAB — LAB REPORT - SCANNED
ALT: 44 — AB (ref 3–30)
ALT: 57 — AB (ref 3–30)
Alkaline Phosphatase: 64
Alkaline Phosphatase: 69
Cholesterol: 203
Cholesterol: 263
Creatinine, Ser: 0.81
Creatinine, Ser: 0.96
Glucose: 113
HDL: 43
HDL: 52
HIV Screen 4th Generation wRfx: NONREACTIVE
Hemoglobin: 17.4
Hemoglobin: 18.4
LDL (calc): 116
LDL (calc): 183
Triglycerides: 132 (ref 40–160)
Triglycerides: 207 — AB (ref 40–160)
Uric Acid: 9.6
platelet count: 386000

## 2019-11-15 ENCOUNTER — Encounter: Payer: Self-pay | Admitting: Family Medicine

## 2019-11-19 ENCOUNTER — Other Ambulatory Visit: Payer: Self-pay | Admitting: Family Medicine

## 2019-11-19 ENCOUNTER — Telehealth: Payer: Self-pay | Admitting: Family Medicine

## 2019-11-19 MED ORDER — HYDROMORPHONE HCL 2 MG PO TABS: 2.0000 mg | ORAL_TABLET | ORAL | 0 refills | Status: DC | PRN

## 2019-11-19 MED ORDER — HYDROCODONE-ACETAMINOPHEN 5-325 MG PO TABS: 1.0000 | ORAL_TABLET | Freq: Four times a day (QID) | ORAL | 0 refills | Status: DC | PRN

## 2019-11-19 MED ORDER — ZOLPIDEM TARTRATE 10 MG PO TABS: 10.0000 mg | ORAL_TABLET | Freq: Every evening | ORAL | 0 refills | Status: DC | PRN

## 2019-11-21 ENCOUNTER — Telehealth (INDEPENDENT_AMBULATORY_CARE_PROVIDER_SITE_OTHER): Payer: Self-pay | Admitting: Family Medicine

## 2019-11-21 VITALS — Ht 72.0 in | Wt 350.0 lb

## 2019-11-21 DIAGNOSIS — F419 Anxiety disorder, unspecified: Secondary | ICD-10-CM

## 2019-11-21 DIAGNOSIS — G8929 Other chronic pain: Secondary | ICD-10-CM

## 2019-11-21 DIAGNOSIS — F32A Depression, unspecified: Secondary | ICD-10-CM
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
## 2019-11-24 NOTE — Assessment & Plan Note (Signed)
Mood discussed with patient.  Refer to psychiatry.  He contracts for safety.  No suicidal or homicidal intent.  Okay for outpatient follow-up.  No change in Abilify and Lexapro at this point.  He agrees.

## 2019-12-02 ENCOUNTER — Other Ambulatory Visit: Payer: Self-pay | Admitting: Family Medicine

## 2019-12-02 ENCOUNTER — Other Ambulatory Visit: Payer: Self-pay | Admitting: *Deleted

## 2019-12-02 MED ORDER — ONDANSETRON 4 MG PO TBDP: 4.0000 mg | ORAL_TABLET | Freq: Three times a day (TID) | ORAL | 3 refills | Status: DC | PRN

## 2019-12-02 MED ORDER — ARIPIPRAZOLE 5 MG PO TABS
2.5000 mg | ORAL_TABLET | Freq: Every day | ORAL | 1 refills | Status: DC
Start: 2019-12-02 — End: 2020-10-08

## 2019-12-02 MED ORDER — ESCITALOPRAM OXALATE 20 MG PO TABS
20.0000 mg | ORAL_TABLET | Freq: Every day | ORAL | 3 refills | Status: DC
Start: 2019-12-02 — End: 2020-10-28

## 2019-12-02 MED ORDER — ONDANSETRON 4 MG PO TBDP: 4.0000 mg | ORAL_TABLET | Freq: Three times a day (TID) | ORAL | 1 refills | Status: DC | PRN

## 2019-12-02 NOTE — Telephone Encounter (Signed)
Patient left a voicemail stating that he is out of his Escitalopram since Friday. Patent stated that this helps control his moods and he can already see a change in his mood. Patient is requesting that a 90 day supply be sent to [facility]. Patient stated that he tried to send a mychart message and he could not get it to go thru.

## 2019-12-02 NOTE — Telephone Encounter (Signed)
rxs sent. If this isn't helping his mood then let me know.  Thanks.

## 2019-12-02 NOTE — Telephone Encounter (Signed)
Patient called back and left another voicemail stating that he also needs refills on Zofran and Abilify. Last refill Zofran 02/08/18 #90/3 Last refill [facility] 10/13/19 #45/1

## 2019-12-04 ENCOUNTER — Telehealth: Payer: Self-pay | Admitting: Family Medicine

## 2019-12-04 ENCOUNTER — Encounter: Payer: Self-pay | Admitting: Family Medicine

## 2019-12-04 ENCOUNTER — Other Ambulatory Visit: Payer: Self-pay | Admitting: Family Medicine

## 2019-12-04 DIAGNOSIS — R7989 Other specified abnormal findings of blood chemistry: Secondary | ICD-10-CM

## 2019-12-04 NOTE — Telephone Encounter (Signed)
I spoke with pt and pt is on his way to ED now; I advised pt if he received the help he needs at ED that is great and I was given info about new facility called [facility] located on [address]; phone [phone]; no fee for psy eval; no appt required if urgent need; pt can walk in 24/7 and will be triaged, speak with a counselor and provider and plan of care formed. Pt cannot have insurance but pt can be on medicaid also. Pt was driving and will cb with update and to get this info if needed. FYI to Dr Lorrin Jackson an and Dr Darnell Level.

## 2019-12-04 NOTE — Telephone Encounter (Signed)
FYI.  See below.  Thanks.

## 2019-12-04 NOTE — Telephone Encounter (Signed)
Thank you Dr Damita Dunnings.

## 2019-12-04 NOTE — Telephone Encounter (Signed)
Thank you for directing the patient to ER.  Do you have any follow up on the patient in the meantime?  I was unavailable this AM but please call me if needed.  Thanks.

## 2019-12-04 NOTE — Telephone Encounter (Signed)
I called and talked to patent.  He was eating lunch with a friend.  No SI/HI currently.  We talked about options.  He is in [place] to get follow-up x-rays done.  There are several options for care at this point.  My main concern is not where he gets care.  My main concern is that he gets care.  We discussed this.  He understood.  He can dial 911 if he has any thoughts of hurting himself or hurting anyone else.  He can go to an emergency room at any hospital if he is in crisis.  He can also call or go to the [facility].  For the moment he has support from a friend, he wanted to get his x-rays done, and then he wants to get back to [place].  If he has any thoughts of self-harm at any point then I told him to dial 911 and he agreed.  He wanted to contact information for the [facility].  I told him I would send a MyChart message with all of the information.  I will send that as soon as I finish this note.

## 2019-12-04 NOTE — Telephone Encounter (Signed)
I initially spoke with pt as he was leaving [place] going to [place] at 9:42AM, I called the pt back to ck on him at 10:50 and he said he was on his way to ED then. I just called pt at 1:18 PM and left v/m asking pt to call our office with update. FYI to Dr Damita Dunnings.

## 2019-12-04 NOTE — Telephone Encounter (Signed)
Noted  

## 2019-12-04 NOTE — Telephone Encounter (Signed)
See other phone note from today.

## 2019-12-04 NOTE — Telephone Encounter (Signed)
Rena,  Can you please call and triage this patient.

## 2019-12-04 NOTE — Telephone Encounter (Signed)
Called and LMOVM after hours to check on patient.  Please get update on patient tomorrow re: going to behavioral health clinic in [place].  Thanks.
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
## 2019-12-06 NOTE — Telephone Encounter (Signed)
Called and left brief voicemail for patient to call the office back.

## 2019-12-08 NOTE — Telephone Encounter (Addendum)
Please call and try to get update on patient on Monday.  Thanks.

## 2019-12-09 NOTE — Telephone Encounter (Signed)
Called patient and left brief voicemail for patient to return call.

## 2019-12-12 NOTE — Telephone Encounter (Signed)
Please try to get update on patient again.  If you can't get him, then please let me know.  Thanks.

## 2019-12-12 NOTE — Telephone Encounter (Signed)
Called and left voicemail for patient to call office back  

## 2019-12-17 NOTE — Telephone Encounter (Signed)
Call pt and LVM for him to contact the office or send [place] a message thru [facility] on his mood condition and also to F/U with [place] on his Bp.  Sent message thru MyChart as well

## 2019-12-17 NOTE — Telephone Encounter (Signed)
Please see if you can get update on patient.  Either way let me know.  I need an update on his mood.  As a separate issue, we also need to follow-up his blood pressure.  Thanks.

## 2019-12-24 ENCOUNTER — Encounter: Payer: Self-pay | Admitting: Family Medicine

## 2019-12-26 ENCOUNTER — Other Ambulatory Visit: Payer: Self-pay | Admitting: Family Medicine

## 2019-12-26 MED ORDER — HYDROCODONE-ACETAMINOPHEN 5-325 MG PO TABS
1.0000 | ORAL_TABLET | Freq: Four times a day (QID) | ORAL | 0 refills | Status: DC | PRN
Start: 2019-12-26 — End: 2020-01-29

## 2019-12-26 MED ORDER — HYDROMORPHONE HCL 2 MG PO TABS: 2.0000 mg | ORAL_TABLET | ORAL | 0 refills | Status: DC | PRN

## 2019-12-26 NOTE — Progress Notes (Signed)
o

## 2019-12-31 ENCOUNTER — Encounter (HOSPITAL_BASED_OUTPATIENT_CLINIC_OR_DEPARTMENT_OTHER): Payer: Self-pay | Admitting: *Deleted

## 2019-12-31 ENCOUNTER — Encounter: Payer: Self-pay | Admitting: Family Medicine

## 2019-12-31 ENCOUNTER — Emergency Department (HOSPITAL_BASED_OUTPATIENT_CLINIC_OR_DEPARTMENT_OTHER)
Admission: EM | Admit: 2019-12-31 | Discharge: 2019-12-31 | Disposition: A | Discharge status: Home / Self Care | Payer: HRSA Program | Attending: Emergency Medicine | Admitting: Emergency Medicine

## 2019-12-31 ENCOUNTER — Telehealth: Payer: Self-pay | Admitting: *Deleted

## 2019-12-31 ENCOUNTER — Emergency Department (HOSPITAL_BASED_OUTPATIENT_CLINIC_OR_DEPARTMENT_OTHER): Payer: HRSA Program

## 2019-12-31 ENCOUNTER — Other Ambulatory Visit: Payer: Self-pay

## 2019-12-31 DIAGNOSIS — U071 COVID-19: Secondary | ICD-10-CM | POA: Insufficient documentation

## 2019-12-31 DIAGNOSIS — R059 Cough, unspecified: Secondary | ICD-10-CM | POA: Diagnosis present

## 2019-12-31 LAB — RESP PANEL BY RT-PCR (FLU A&B, COVID) ARPGX2
Influenza A by PCR: NEGATIVE
Influenza B by PCR: NEGATIVE
SARS Coronavirus 2 by RT PCR: POSITIVE — AB

## 2019-12-31 MED ORDER — ONDANSETRON 4 MG PO TBDP: 4.0000 mg | ORAL_TABLET | Freq: Three times a day (TID) | ORAL | 0 refills | Status: DC | PRN

## 2019-12-31 MED ORDER — PREDNISONE 10 MG (21) PO TBPK: ORAL_TABLET | ORAL | 0 refills | Status: DC

## 2019-12-31 MED ORDER — BENZONATATE 100 MG PO CAPS: 100.0000 mg | ORAL_CAPSULE | Freq: Three times a day (TID) | ORAL | 0 refills | Status: DC

## 2019-12-31 MED ORDER — ALBUTEROL SULFATE HFA 108 (90 BASE) MCG/ACT IN AERS
1.0000 | INHALATION_SPRAY | Freq: Four times a day (QID) | RESPIRATORY_TRACT | 0 refills | Status: DC | PRN
Start: 2019-12-31 — End: 2020-03-20

## 2019-12-31 MED ORDER — ALBUTEROL SULFATE HFA 108 (90 BASE) MCG/ACT IN AERS
1.0000 | INHALATION_SPRAY | Freq: Four times a day (QID) | RESPIRATORY_TRACT | 0 refills | Status: DC | PRN
Start: 2019-12-31 — End: 2019-12-31

## 2019-12-31 NOTE — ED Triage Notes (Signed)
Coughing started yesterday afternoon at 3p and chest pain. Pt also stated that he has chills at night time. Coughing makes his chest pain worst.

## 2019-12-31 NOTE — Telephone Encounter (Signed)
Patient left a voicemail stating that he was just seen in the ER and tested positive for covid. See ER notes. Patient stated that he was advised to contract his PCP about any additional medications and to see if he may be able to get the infusion? Patient requested a call back regarding this.

## 2019-12-31 NOTE — Telephone Encounter (Signed)
I will route this for covid infusion consideration.  Thanks.    COVID INFUSION CANDIDATE  Exposure date:  unk Test date:  12/31/19  Symptom onset: 12/30/19 Risk factor:  BMI

## 2019-12-31 NOTE — ED Notes (Signed)
Patient states that he needs to be in court by 1 pm today.
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
## 2019-12-31 NOTE — Discharge Instructions (Addendum)
Test Results for COVID-19 pending  You have a test pending for COVID-19.  Results typically return within about 48 hours.  Be sure to check MyChart for updated results.  We recommend isolating yourself until results are received.  Patients who have symptoms consistent with COVID-19 should self isolated for: At least 3 days (72 hours) have passed since recovery, defined as resolution of fever without the use of fever reducing medications and improvement in respiratory symptoms (e.g., cough, shortness of breath), and At least 7 days have passed since symptoms first appeared.  If you have no symptoms, but your test returns positive, recommend isolating for at least 10 days.   Your blood pressure was higher than normal today.  Please have this rechecked by your primary care provider.  General Viral Syndrome Care Instructions:  Your symptoms are likely consistent with a viral illness. Viruses do not require or respond to antibiotics. Treatment is symptomatic care and it is important to note that these symptoms may last for 7-14 days.   Hand washing: Wash your hands throughout the day, but especially before and after touching the face, using the restroom, sneezing, coughing, or touching surfaces that have been coughed or sneezed upon. Hydration: Symptoms of most illnesses will be intensified and complicated by dehydration. Dehydration can also extend the duration of symptoms. Drink plenty of fluids and get plenty of rest. You should be drinking at least half a liter of water an hour to stay hydrated. Electrolyte drinks (ex. Gatorade, Powerade, Pedialyte) are also encouraged. You should be drinking enough fluids to make your urine light yellow, almost clear. If this is not the case, you are not drinking enough water. Please note that some of the treatments indicated below will not be effective if you are not adequately hydrated. Diet: Please concentrate on hydration, however, you may introduce food  slowly.  Start with a clear liquid diet, progressed to a full liquid diet, and then bland solids as you are able. Pain or fever: Ibuprofen, Naproxen, or acetaminophen (generic for Tylenol) for pain or fever.  Antiinflammatory medications: Take 600 mg of ibuprofen every 6 hours or 440 mg (over the counter dose) to 500 mg (prescription dose) of naproxen every 12 hours for the next 3 days. After this time, these medications may be used as needed for pain. Take these medications with food to avoid upset stomach. Choose only one of these medications, do not take them together. Acetaminophen (generic for Tylenol): Should you continue to have additional pain while taking the ibuprofen or naproxen, you may add in acetaminophen as needed. Your daily total maximum amount of acetaminophen from all sources should be limited to 4000mg /day for persons without liver problems, or 2000mg /day for those with liver problems. Nausea/vomiting: Use the ondansetron (generic for Zofran) for nausea or vomiting.  This medication may not prevent all vomiting or nausea, but can help facilitate better hydration. Things that can help with nausea/vomiting also include peppermint/menthol candies, vitamin B12, and ginger. Diarrhea: May use medications such as loperamide (Imodium) or Bismuth subsalicylate (Pepto-Bismol). Cough: Use the benzonatate (generic for Tessalon) for cough.  Teas, warm liquids, broths, and honey can also help with cough. Albuterol: May use the albuterol as needed for instances of shortness of breath. Prednisone: Take the prednisone, as directed, in its entirety. Zyrtec or Claritin: May add these medication daily to control underlying symptoms of congestion, sneezing, and other signs of allergies.  These medications are available over-the-counter. Generics: Cetirizine (generic for Zyrtec) and loratadine (generic for  Claritin). Fluticasone: Use fluticasone (generic for Flonase), as directed, for nasal and sinus  congestion.  This medication is available over-the-counter. Congestion: Plain guaifenesin (generic for plain Mucinex) may help relieve congestion. Saline sinus rinses and saline nasal sprays may also help relieve congestion. If you do not have high blood pressure, heart problems, or an allergy to such medications, you may also try phenylephrine or Sudafed. Sore throat: Warm liquids or Chloraseptic spray may help soothe a sore throat. Gargle twice a day with a salt water solution made from a half teaspoon of salt in a cup of warm water.  Follow up: Follow up with a primary care provider within the next two weeks should symptoms fail to resolve. Return: Return to the ED for significantly worsening symptoms, shortness of breath, persistent vomiting, large amounts of blood in stool, or any other major concerns.  For prescription assistance, may try using prescription discount sites or apps, such as goodrx.com

## 2019-12-31 NOTE — ED Provider Notes (Signed)
[facility] EMERGENCY DEPARTMENT Provider Note   CSN: [id number] Arrival date & time: 12/31/19  0539     History Chief Complaint  Patient presents with  . Cough  . Chest Pain    Casey Caldwell is a 39 y.o. male.  HPI      Casey Caldwell is a 39 y.o. male, with a history of GERD, kidney stones, chronic pain, obesity, presenting to the ED with nonproductive cough beginning yesterday. Following the onset of this cough, he began to experience central chest soreness, worse with movement or palpation, nonradiating. Some nausea, chills, body aches, fatigue, and occasional shortness of breath. Covid vaccinated. Denies known fever, vomiting, diarrhea, abdominal pain, dizziness, syncope, or any other complaints.   Past Medical History:  Diagnosis Date  . Depression    and panic/anxiety  . GERD (gastroesophageal reflux disease)   . History of kidney stones   . Insomnia   . Kidney stones   . Seizures ([place]) Childhood    Patient Active Problem List   Diagnosis Date Noted  . Chronic hip pain, right 04/29/2019  . Right groin pain 04/29/2019  . Spinal cord stimulator status 02/28/2019  . Lumbar radiculopathy 02/28/2019  . Gout 01/13/2019  . Rectal pain 07/15/2018  . Plantar fasciitis 08/14/2017  . Encounter for chronic pain management 01/28/2016  . Chronic pain syndrome 10/30/2015  . Adenomatous colon polyp 06/06/2012  . Nephrolithiasis 03/14/2012  . Polycythemia 03/14/2012  . Groin pain, chronic, left 03/14/2012  . RUQ pain 03/14/2012  . Insomnia 03/14/2012  . Seizure disorder ([place]) 03/14/2012  . Anxiety and depression 03/14/2012    Past Surgical History:  Procedure Laterality Date  . APPENDECTOMY  2011  . CHOLECYSTECTOMY, LAPAROSCOPIC    . CYSTOSCOPY/URETEROSCOPY/HOLMIUM LASER Left 06/15/2017   Procedure: CYSTOSCOPY/LEFT URETEROSCOPYSTONE EXTRACTION/LEFT RETROGRADE;  Surgeon: Franchot Gallo, MD;  Location: [facility];  Service: Urology;  Laterality: Left;   With STENT  . HYDROCELE EXCISION Left 06/2012  . LAPAROSCOPIC CHOLECYSTECTOMY  1996  . LITHOTRIPSY     four times over the years; most recent 10/08/12  . ORCHIECTOMY Left 11/2012  . SPINAL CORD STIMULATOR INSERTION  2015  . TONSILLECTOMY  1992  . WRIST GANGLION EXCISION  2002   Right       Family History  Problem Relation Age of Onset  . Stroke Mother   . Liver disease Mother   . Irritable bowel syndrome Mother   . Kidney disease Mother   . Heart disease Father   . Colon polyps Father   . Colon cancer Neg Hx     Social History   Tobacco Use  . Smoking status: Never Smoker  . Smokeless tobacco: Never Used  Substance Use Topics  . Alcohol use: Not Currently    Alcohol/week: 0.0 standard drinks    Comment: rarely  . Drug use: No    Home Medications Prior to Admission medications   Medication Sig Start Date End Date Taking? Authorizing Provider  allopurinol (ZYLOPRIM) 100 MG tablet Take 1 tablet (100 mg total) by mouth daily. 04/29/19  Yes Hilts, Legrand Como, MD  ARIPiprazole (ABILIFY) 5 MG tablet Take 0.5 tablets (2.5 mg total) by mouth daily. 12/02/19  Yes Tonia Ghent, MD  Colchicine 0.6 MG CAPS Take 1 tablet by mouth 2 (two) times daily as needed. 03/18/19  Yes Hilts, Legrand Como, MD  escitalopram (LEXAPRO) 20 MG tablet Take 1 tablet (20 mg total) by mouth daily. 12/02/19  Yes Tonia Ghent, MD  HYDROcodone-acetaminophen (NORCO/VICODIN)  5-325 MG tablet Take 1-2 tablets by mouth every 6 (six) hours as needed for severe pain (8 tabs or less per day). 12/26/19  Yes Tonia Ghent, MD  indapamide (LOZOL) 2.5 MG tablet Take 1 tablet (2.5 mg total) by mouth daily. 05/17/19  Yes Tonia Ghent, MD  pantoprazole (PROTONIX) 40 MG tablet TAKE ONE TABLET BY MOUTH TWICE A DAY 07/23/19  Yes Tonia Ghent, MD  potassium citrate (UROCIT-K) 10 MEQ (1080 MG) SR tablet Take 1 tablet (10 mEq total) by mouth 2 (two) times daily. 05/17/19  Yes Tonia Ghent, MD  zolpidem (AMBIEN) 10 MG  tablet Take 1 tablet (10 mg total) by mouth at bedtime as needed. for sleep 11/19/19  Yes Tonia Ghent, MD  albuterol (VENTOLIN HFA) 108 (90 Base) MCG/ACT inhaler Inhale 1-2 puffs into the lungs every 6 (six) hours as needed for wheezing or shortness of breath. 12/31/19   Rollan Roger C, PA-C  benzonatate (TESSALON) 100 MG capsule Take 1 capsule (100 mg total) by mouth every 8 (eight) hours. 12/31/19   Theotis Gerdeman C, PA-C  docusate sodium (COLACE) 100 MG capsule Take 1 capsule (100 mg total) by mouth 2 (two) times daily. 01/27/16   Tonia Ghent, MD  HYDROmorphone (DILAUDID) 2 MG tablet Take 1 tablet (2 mg total) by mouth every 4 (four) hours as needed for severe pain (for kidney stones). 12/26/19   Tonia Ghent, MD  ondansetron (ZOFRAN ODT) 4 MG disintegrating tablet Take 1 tablet (4 mg total) by mouth every 8 (eight) hours as needed for nausea or vomiting. 12/02/19   Tonia Ghent, MD  ondansetron (ZOFRAN ODT) 4 MG disintegrating tablet Take 1 tablet (4 mg total) by mouth every 8 (eight) hours as needed for nausea or vomiting. 12/31/19   Oletha Tolson C, PA-C  polyethylene glycol powder (GLYCOLAX/MIRALAX) powder MIX 17G IN 4 TO 8 OUNCES OF FLUID AND TAKE TWICE DAILY 02/08/18   Tonia Ghent, MD  predniSONE (STERAPRED UNI-PAK 21 TAB) 10 MG (21) TBPK tablet Take 6 tabs (60mg ) day 1, 5 tabs (50mg ) day 2, 4 tabs (40mg ) day 3, 3 tabs (30mg ) day 4, 2 tabs (20mg ) day 5, and 1 tab (10mg ) day 6. 12/31/19   Sharonlee Nine C, PA-C  tamsulosin (FLOMAX) 0.4 MG CAPS capsule Take 1 capsule (0.4 mg total) by mouth daily. 02/08/18   Tonia Ghent, MD    Allergies    Phenergan [promethazine hcl], Tape, Toradol [ketorolac tromethamine], Trileptal [oxcarbazepine], and Wellbutrin [bupropion]  Review of Systems   Review of Systems  Constitutional: Positive for chills. Negative for fever.  Respiratory: Positive for cough and shortness of breath.   Cardiovascular: Negative for leg swelling.  Gastrointestinal: Positive  for nausea. Negative for abdominal pain, diarrhea and vomiting.  Musculoskeletal: Positive for myalgias.       Central chest soreness  Neurological: Negative for dizziness, syncope, weakness and numbness.  All other systems reviewed and are negative.   Physical Exam Updated Vital Signs BP (!) 147/98 (BP Location: Right Arm) Comment: pt monitor and vital cicling  Pulse 96   Temp 98.7 F (37.1 C) (Oral)   Resp 16   Ht 6' (1.829 m)   Wt (!) 154.2 kg   SpO2 96%   BMI 46.11 kg/m   Physical Exam Vitals and nursing note reviewed.  Constitutional:      General: He is not in acute distress.    Appearance: He is well-developed. He is obese. He is  not diaphoretic.  HENT:     Head: Normocephalic and atraumatic.     Mouth/Throat:     Mouth: Mucous membranes are moist.     Pharynx: Oropharynx is clear.  Eyes:     Conjunctiva/sclera: Conjunctivae normal.  Cardiovascular:     Rate and Rhythm: Normal rate and regular rhythm.     Pulses: Normal pulses.          Radial pulses are 2+ on the right side and 2+ on the left side.       Posterior tibial pulses are 2+ on the right side and 2+ on the left side.     Heart sounds: Normal heart sounds.     Comments: Tactile temperature in the extremities appropriate and equal bilaterally. Pulmonary:     Effort: Pulmonary effort is normal. No respiratory distress.     Breath sounds: Normal breath sounds.     Comments: No increased work of breathing.  Speaks in full sentences without difficulty. Chest:     Chest wall: Tenderness present. No deformity, swelling or crepitus.    Abdominal:     Palpations: Abdomen is soft.     Tenderness: There is no abdominal tenderness. There is no guarding.  Musculoskeletal:     Cervical back: Neck supple.     Right lower leg: No edema.     Left lower leg: No edema.  Lymphadenopathy:     Cervical: No cervical adenopathy.  Skin:    General: Skin is warm and dry.  Neurological:     Mental Status: He is alert  and oriented to person, place, and time.     Comments: No noted acute cognitive deficit. Sensation grossly intact to light touch in the extremities.   Grip strengths equal bilaterally.   Strength 5/5 in all extremities.  No gait disturbance.  Coordination intact.  Cranial nerves III-XII grossly intact.  Handles oral secretions without noted difficulty.  No noted phonation or speech deficit. No facial droop.   Psychiatric:        Mood and Affect: Mood and affect normal.        Speech: Speech normal.        Behavior: Behavior normal.     ED Results / Procedures / Treatments   Labs (all labs ordered are listed, but only abnormal results are displayed) Labs Reviewed  RESP PANEL BY RT-PCR (FLU A&B, COVID) [id number]    EKG EKG Interpretation  Date/Time:  Tuesday December 31 2019 08:52:31 EST Ventricular Rate:  94 PR Interval:    QRS Duration: 89 QT Interval:  342 QTC Calculation: 428 R Axis:   -145 Text Interpretation: Sinus rhythm Markedly posterior QRS axis since last tracing no significant change Confirmed by Malvin Johns [phone]) on 12/31/2019 9:32:18 AM   Radiology DG Chest Port 1 View  Result Date: 12/31/2019 CLINICAL DATA:  Coughing, chest pain EXAM: PORTABLE CHEST 1 VIEW COMPARISON:  October 26, 2018 FINDINGS: Low lung volumes. No new consolidation or edema. No pleural effusion. No pneumothorax. Stable cardiomediastinal contours. Thoracic spinal stimulator. IMPRESSION: No acute process in the chest. Electronically Signed   By: Macy Mis M.D.   On: 12/31/2019 09:49    Procedures Procedures (including critical care time)  Medications Ordered in ED Medications - No data to display  ED Course  I have reviewed the triage vital signs and the nursing notes.  Pertinent labs & imaging results that were available during my care of the patient were reviewed by me and considered in  my medical decision making (see chart for details).    MDM Rules/Calculators/A&P                           Patient presents with cough beginning yesterday as well as chest soreness, nausea, chills, body aches. Patient is nontoxic appearing, afebrile, not tachycardic, not tachypneic, not hypotensive, maintains excellent SPO2 on room air, and is in no apparent distress.   Low suspicion for ACS.  Pain is easily reproducible with even light palpation of the chest.  No high risk/suspicious features; no exertional chest pain, vomiting, diaphoresis, or radiation.  Other than inclusion of a troponin, HEART score is 2, indicating low risk for a cardiac event.  EKG without evidence of acute ischemia or pathologic/symptomatic arrhythmia.  EKG also consistent with previous. Wells criteria score is 0, indicating low risk for PE.  PERC negative. Dissection was considered, but thought less likely base on: History and description of the pain are not suggestive, patient is not ill-appearing, lack of risk factors, equal bilateral pulses, lack of neurologic deficits, no widened mediastinum on chest x-ray.  I reviewed and interpreted the patient's radiological studies.  The patient was given instructions for home care as well as return precautions. Patient voices understanding of these instructions, accepts the plan, and is comfortable with discharge.   Casey Caldwell was evaluated in Emergency Department on 12/31/2019 for the symptoms described in the history of present illness. He was evaluated in the context of the global COVID-19 pandemic, which necessitated consideration that the patient might be at risk for infection with the SARS-CoV-2 virus that causes COVID-19. Institutional protocols and algorithms that pertain to the evaluation of patients at risk for COVID-19 are in a state of rapid change based on information released by regulatory bodies including the CDC and federal and state organizations. These policies and algorithms were followed during the patient's care in the ED.   Final Clinical  Impression(s) / ED Diagnoses Final diagnoses:  Cough    Rx / DC Orders ED Discharge Orders         Ordered    benzonatate (TESSALON) 100 MG capsule  Every 8 hours,   Status:  Discontinued        12/31/19 1024    ondansetron (ZOFRAN ODT) 4 MG disintegrating tablet  Every 8 hours PRN,   Status:  Discontinued        12/31/19 1024    predniSONE (STERAPRED UNI-PAK 21 TAB) 10 MG (21) TBPK tablet  Status:  Discontinued        12/31/19 1024    albuterol (VENTOLIN HFA) 108 (90 Base) MCG/ACT inhaler  Every 6 hours PRN,   Status:  Discontinued        12/31/19 1024    albuterol (VENTOLIN HFA) 108 (90 Base) MCG/ACT inhaler  Every 6 hours PRN        12/31/19 1025    benzonatate (TESSALON) 100 MG capsule  Every 8 hours        12/31/19 1025    ondansetron (ZOFRAN ODT) 4 MG disintegrating tablet  Every 8 hours PRN        12/31/19 1025    predniSONE (STERAPRED UNI-PAK 21 TAB) 10 MG (21) TBPK tablet        12/31/19 1025           Deakin Lacek, Helane Gunther, PA-C 12/31/19 1038    Malvin Johns, MD 12/31/19 1051

## 2019-12-31 NOTE — ED Notes (Signed)
Pt requesting that we contact the court house, as he is supposed to be in court today.  I spoke with charge nurse regarding pt's request.  Charge Nurse spoke with Crystal, [facility] and was told that paper work is only released upon discharge.  Charge nurse currently in room talking with patient

## 2020-01-01 ENCOUNTER — Other Ambulatory Visit: Payer: Self-pay | Admitting: Nurse Practitioner

## 2020-01-01 DIAGNOSIS — D751 Secondary polycythemia: Secondary | ICD-10-CM

## 2020-01-01 DIAGNOSIS — U071 COVID-19: Secondary | ICD-10-CM

## 2020-01-01 DIAGNOSIS — G40909 Epilepsy, unspecified, not intractable, without status epilepticus: Secondary | ICD-10-CM

## 2020-01-01 NOTE — Progress Notes (Signed)
I connected by phone with [name] on 01/01/2020 at 6:07 PM to discuss the potential use of a new treatment for mild to moderate COVID-19 viral infection in non-hospitalized patients.  This patient is a 39 y.o. male that meets the FDA criteria for Emergency Use Authorization of COVID monoclonal antibody casirivimab/imdevimab, bamlanivimab/eteseviamb, or sotrovimab.  Has a (+) direct SARS-CoV-2 viral test result  Has mild or moderate COVID-19   Is NOT hospitalized due to COVID-19  Is within 10 days of symptom onset  Has at least one of the high risk factor(s) for progression to severe COVID-19 and/or hospitalization as defined in EUA.  Specific high risk criteria : BMI > 25 and Immunosuppressive Disease or Treatment   I have spoken and communicated the following to the patient or parent/caregiver regarding COVID monoclonal antibody treatment:  1. FDA has authorized the emergency use for the treatment of mild to moderate COVID-19 in adults and pediatric patients with positive results of direct SARS-CoV-2 viral testing who are 48 years of age and older weighing at least 40 kg, and who are at high risk for progressing to severe COVID-19 and/or hospitalization.  2. The significant known and potential risks and benefits of COVID monoclonal antibody, and the extent to which such potential risks and benefits are unknown.  3. Information on available alternative treatments and the risks and benefits of those alternatives, including clinical trials.  4. Patients treated with COVID monoclonal antibody should continue to self-isolate and use infection control measures (e.g., wear mask, isolate, social distance, avoid sharing personal items, clean and disinfect "high touch" surfaces, and frequent handwashing) according to CDC guidelines.   5. The patient or parent/caregiver has the option to accept or refuse COVID monoclonal antibody treatment.  After reviewing this information with the patient,  the patient has agreed to receive one of the available covid 19 monoclonal antibodies and will be provided an appropriate fact sheet prior to infusion. Jobe Gibbon, NP 01/01/2020 6:07 PM

## 2020-01-01 NOTE — Telephone Encounter (Signed)
Thanks.  MyChart message sent to patient.

## 2020-01-02 ENCOUNTER — Other Ambulatory Visit: Payer: Self-pay

## 2020-01-02 ENCOUNTER — Telehealth (INDEPENDENT_AMBULATORY_CARE_PROVIDER_SITE_OTHER): Payer: HRSA Program | Admitting: Family Medicine

## 2020-01-02 DIAGNOSIS — U071 COVID-19: Secondary | ICD-10-CM | POA: Diagnosis not present

## 2020-01-02 NOTE — Progress Notes (Signed)
Virtual visit completed through WebEx or similar program Patient location: home  Provider location: [place] at [facility], office  Participants: Patient and me (unless stated otherwise below)  Pandemic considerations d/w pt.   Limitations and rationale for visit method d/w patient.  Patient agreed to proceed.   CC: covid positive.    HPI: "I could be worse.  The first day was the worst."  Recently tested positive.  No fever but having hot/chill episodes.  Cough is some better.  Change in taste and smell.  Some sputum, recently noted.  He prev tasted some blood with the cough but no gross hemoptysis.  Resting a lot in the last few days.  Still taking fluids, "I'm trying."    He is staying at his wife's house, he is downstairs and she is upstairs.  She is masking.    He is vaccinated.  D/w pt.  He has infusion pending for tomorrow at 12:30.    He is still speaking in complete sentences but cough returns with activity.  No vomiting.  Some loose stools.  He has tried using SABA for cough, with unclear amount of benefit. He is on prednisone.  Steroid cautions d/w pt.  Appetite is variable.  He is fatigued but now drowsy from his pain medicines.    Wife isn't ill at this point.  He clearly isn't back to baseline but he has made some progress each day.    Meds and allergies reviewed.   ROS: Per HPI unless specifically indicated in ROS section   NAD Speech wnl  A/P:  Covid.  Still okay for outpatient follow-up, with outpatient infusion pending.  Continue prednisone and albuterol as needed for now.  Continue rest and fluids.  Routine cautions given to patient.  Seek evaluation if he has frank hemoptysis.  He agrees with plan.  He will update me as needed.

## 2020-01-03 ENCOUNTER — Ambulatory Visit (HOSPITAL_COMMUNITY)
Admission: RE | Admit: 2020-01-03 | Discharge: 2020-01-03 | Disposition: A | Discharge status: Home / Self Care | Payer: HRSA Program | Source: Ambulatory Visit | Attending: Pulmonary Disease | Admitting: Pulmonary Disease

## 2020-01-03 DIAGNOSIS — D751 Secondary polycythemia: Secondary | ICD-10-CM | POA: Diagnosis present

## 2020-01-03 DIAGNOSIS — G40909 Epilepsy, unspecified, not intractable, without status epilepticus: Secondary | ICD-10-CM | POA: Diagnosis present

## 2020-01-03 DIAGNOSIS — U071 COVID-19: Secondary | ICD-10-CM | POA: Diagnosis present

## 2020-01-03 MED ORDER — FAMOTIDINE IN NACL 20-0.9 MG/50ML-% IV SOLN: 20.0000 mg | Freq: Once | INTRAVENOUS | Status: DC | PRN

## 2020-01-03 MED ORDER — DIPHENHYDRAMINE HCL 50 MG/ML IJ SOLN: 50.0000 mg | Freq: Once | INTRAMUSCULAR | Status: DC | PRN

## 2020-01-03 MED ORDER — SODIUM CHLORIDE 0.9 % IV SOLN
1200.0000 mg | Freq: Once | INTRAVENOUS | Status: AC
  Administered 2020-01-03: 1200 mg via INTRAVENOUS

## 2020-01-03 MED ORDER — METHYLPREDNISOLONE SODIUM SUCC 125 MG IJ SOLR: 125.0000 mg | Freq: Once | INTRAMUSCULAR | Status: DC | PRN

## 2020-01-03 MED ORDER — EPINEPHRINE 0.3 MG/0.3ML IJ SOAJ: 0.3000 mg | Freq: Once | INTRAMUSCULAR | Status: DC | PRN

## 2020-01-03 MED ORDER — ALBUTEROL SULFATE HFA 108 (90 BASE) MCG/ACT IN AERS: 2.0000 | INHALATION_SPRAY | Freq: Once | RESPIRATORY_TRACT | Status: DC | PRN

## 2020-01-03 MED ORDER — SODIUM CHLORIDE 0.9 % IV SOLN: INTRAVENOUS | Status: DC | PRN

## 2020-01-03 MED ORDER — SODIUM CHLORIDE 0.9 % IV BOLUS
1000.0000 mL | Freq: Once | INTRAVENOUS | Status: AC
  Administered 2020-01-03: 1000 mL via INTRAVENOUS

## 2020-01-03 NOTE — Progress Notes (Addendum)
Note: BP elevated on arrival and at discharge.  Spoke with Colletta Maryland, NP on call to discuss; educated pt to avoid NSAIDs, careful with Tylenol, and avoid pseudoephedrine.  Pt indicates understanding and will follow-up with his PCP.    Diagnosis: COVID-19  Physician:  Dr. Asencion Noble  Procedure:  Medication fact sheet provided to patient; all questions answered.  Allergies reviewed with patient.  IV placed.  Regen-Cov  administered via IV infusion.  Complications: No immediate complications noted.  Discharge: Discharged home   Monna Fam 01/03/2020

## 2020-01-03 NOTE — Discharge Instructions (Signed)
10 Things You Can Do to Manage Your COVID-19 Symptoms at Home If you have possible or confirmed COVID-19: 1. Stay home from work and school. And stay away from other public places. If you must go out, avoid using any kind of public transportation, ridesharing, or taxis. 2. Monitor your symptoms carefully. If your symptoms get worse, call your healthcare provider immediately. 3. Get rest and stay hydrated. 4. If you have a medical appointment, call the healthcare provider ahead of time and tell them that you have or may have COVID-19. 5. For medical emergencies, call 911 and notify the dispatch personnel that you have or may have COVID-19. 6. Cover your cough and sneezes with a tissue or use the inside of your elbow. 7. Wash your hands often with soap and water for at least 20 seconds or clean your hands with an alcohol-based hand sanitizer that contains at least 60% alcohol. 8. As much as possible, stay in a specific room and away from other people in your home. Also, you should use a separate bathroom, if available. If you need to be around other people in or outside of the home, wear a mask. 9. Avoid sharing personal items with other people in your household, like dishes, towels, and bedding. 10. Clean all surfaces that are touched often, like counters, tabletops, and doorknobs. Use household cleaning sprays or wipes according to the label instructions. [email or web address] 07/25/2018 This information is not intended to replace advice given to you by your health care provider. Make sure you discuss any questions you have with your health care provider. Document Revised: 12/27/2018 Document Reviewed: 12/27/2018 Elsevier Patient Education  [place].   What types of side effects do monoclonal antibody drugs cause?  Common side effects  In general, the more common side effects caused by monoclonal antibody drugs include: . Allergic reactions, such as hives or itching . Flu-like signs  and symptoms, including chills, fatigue, fever, and muscle aches and pains . Nausea, vomiting . Diarrhea . Skin rashes . Low blood pressure   The CDC is recommending patients who receive monoclonal antibody treatments wait at least 90 days before being vaccinated.  Currently, there are no data on the safety and efficacy of mRNA COVID-19 vaccines in persons who received monoclonal antibodies or convalescent plasma as part of COVID-19 treatment. Based on the estimated half-life of such therapies as well as evidence suggesting that reinfection is uncommon in the 90 days after initial infection, vaccination should be deferred for at least 90 days, as a precautionary measure until additional information becomes available, to avoid interference of the antibody treatment with vaccine-induced immune responses.   Patient reviewed Fact Sheet for Patients, Parents, and Caregivers for Emergency Use Authorization (EUA) of Regen-Cov for the Treatment of Coronavirus.  Patient also reviewed and is agreeable to the estimated cost of treatment.  Patient is agreeable to proceed.   If you have any questions or concerns after the infusion please call the [facility] Provider on call at [phone]. This number is ONLY intended for your use regarding questions or concerns about the infusion post-treatment side-effects.  Please do not provide this number to others for use. For return to work notes please contact your primary care provider.   If someone you know is interested in receiving treatment please have them call the [facility] hotline at [phone].

## 2020-01-03 NOTE — Progress Notes (Signed)
Patient reviewed Fact Sheet for Patients, Parents, and Caregivers for Emergency Use Authorization (EUA) of Regen-Cov for the Treatment of Coronavirus.  Patient also reviewed and is agreeable to the estimated cost of treatment.  Patient is agreeable to proceed.   

## 2020-01-05 DIAGNOSIS — U071 COVID-19: Secondary | ICD-10-CM | POA: Insufficient documentation

## 2020-01-05 NOTE — Assessment & Plan Note (Addendum)
Covid.  Still okay for outpatient follow-up, with outpatient infusion pending.  Continue prednisone and albuterol as needed for now.  Continue rest and fluids.  Routine cautions given to patient.  Seek evaluation if he has frank hemoptysis.  He agrees with plan.  He will update me as needed.

## 2020-01-08 ENCOUNTER — Emergency Department (HOSPITAL_BASED_OUTPATIENT_CLINIC_OR_DEPARTMENT_OTHER): Payer: Self-pay

## 2020-01-08 ENCOUNTER — Encounter (HOSPITAL_BASED_OUTPATIENT_CLINIC_OR_DEPARTMENT_OTHER): Payer: Self-pay | Admitting: *Deleted

## 2020-01-08 ENCOUNTER — Emergency Department (HOSPITAL_BASED_OUTPATIENT_CLINIC_OR_DEPARTMENT_OTHER)
Admission: EM | Admit: 2020-01-08 | Discharge: 2020-01-09 | Disposition: A | Discharge status: Home / Self Care | Payer: Self-pay | Attending: Emergency Medicine | Admitting: Emergency Medicine

## 2020-01-08 ENCOUNTER — Other Ambulatory Visit: Payer: Self-pay

## 2020-01-08 DIAGNOSIS — N201 Calculus of ureter: Secondary | ICD-10-CM | POA: Insufficient documentation

## 2020-01-08 DIAGNOSIS — Z95828 Presence of other vascular implants and grafts: Secondary | ICD-10-CM | POA: Insufficient documentation

## 2020-01-08 DIAGNOSIS — Z79899 Other long term (current) drug therapy: Secondary | ICD-10-CM | POA: Insufficient documentation

## 2020-01-08 MED ORDER — HYDROMORPHONE HCL 1 MG/ML IJ SOLN
1.0000 mg | Freq: Once | INTRAMUSCULAR | Status: AC
  Administered 2020-01-08: 1 mg via INTRAVENOUS
  Filled 2020-01-08: qty 1

## 2020-01-08 MED ORDER — ONDANSETRON 4 MG PO TBDP
4.0000 mg | ORAL_TABLET | Freq: Once | ORAL | Status: AC
  Administered 2020-01-08: 4 mg via ORAL
  Filled 2020-01-08: qty 1

## 2020-01-08 NOTE — ED Triage Notes (Signed)
Pt does report taking 10mg  Vicodin and 2mg  dilaudid PTA

## 2020-01-08 NOTE — ED Provider Notes (Signed)
[facility] Provider Note: Georgena Spurling, MD, FACEP  CSN: [id number] MRN: [id number] ARRIVAL: 01/08/20 at 2244 ROOM: [place]  Flank Pain   HISTORY OF PRESENT ILLNESS  01/08/20 11:12 PM Casey Caldwell is a 39 y.o. male who was diagnosed with Covid 8 days ago.  He denies any lingering symptoms apart from ongoing loss of taste and smell.   He is here with flank pain that began about 3 hours ago.  He describes the pain as feeling like previous kidney stones, rates the pain as an 8 out of 10.  It is somewhat worse with movement or palpation.  This pain is principally on the right flank radiating to the right groin but he is also having pain on the left.  He does have a history of spinal cord stimulator surgery for chronic back pain and is on pain management.  He has not noted blood in his urine or difficulty urinating but has been nauseated.  He took 2 mg of Dilaudid and 10 mg of hydrocodone prior to arrival without adequate relief.   Past Medical History:  Diagnosis Date  . Depression    and panic/anxiety  . GERD (gastroesophageal reflux disease)   . History of kidney stones   . Insomnia   . Kidney stones   . Seizures ([place]) Childhood    Past Surgical History:  Procedure Laterality Date  . APPENDECTOMY  2011  . CHOLECYSTECTOMY, LAPAROSCOPIC    . CYSTOSCOPY/URETEROSCOPY/HOLMIUM LASER Left 06/15/2017   Procedure: CYSTOSCOPY/LEFT URETEROSCOPYSTONE EXTRACTION/LEFT RETROGRADE;  Surgeon: Franchot Gallo, MD;  Location: [facility];  Service: Urology;  Laterality: Left;  With STENT  . HYDROCELE EXCISION Left 06/2012  . LAPAROSCOPIC CHOLECYSTECTOMY  1996  . LITHOTRIPSY     four times over the years; most recent 10/08/12  . ORCHIECTOMY Left 11/2012  . SPINAL CORD STIMULATOR INSERTION  2015  . TONSILLECTOMY  1992  . WRIST GANGLION EXCISION  2002   Right    Family History  Problem Relation Age of Onset  . Stroke Mother   . Liver disease Mother   .  Irritable bowel syndrome Mother   . Kidney disease Mother   . Heart disease Father   . Colon polyps Father   . Colon cancer Neg Hx     Social History   Tobacco Use  . Smoking status: Never Smoker  . Smokeless tobacco: Never Used  Substance Use Topics  . Alcohol use: Not Currently    Alcohol/week: 0.0 standard drinks    Comment: rarely  . Drug use: No    Prior to Admission medications   Medication Sig Start Date End Date Taking? Authorizing Provider  albuterol (VENTOLIN HFA) 108 (90 Base) MCG/ACT inhaler Inhale 1-2 puffs into the lungs every 6 (six) hours as needed for wheezing or shortness of breath. 12/31/19   Joy, Shawn C, PA-C  allopurinol (ZYLOPRIM) 100 MG tablet Take 1 tablet (100 mg total) by mouth daily. 04/29/19   Hilts, Legrand Como, MD  ARIPiprazole (ABILIFY) 5 MG tablet Take 0.5 tablets (2.5 mg total) by mouth daily. 12/02/19   Tonia Ghent, MD  benzonatate (TESSALON) 100 MG capsule Take 1 capsule (100 mg total) by mouth every 8 (eight) hours. 12/31/19   Joy, Shawn C, PA-C  Colchicine 0.6 MG CAPS Take 1 tablet by mouth 2 (two) times daily as needed. 03/18/19   Hilts, Legrand Como, MD  docusate sodium (COLACE) 100 MG capsule Take 1 capsule (100 mg total) by mouth  2 (two) times daily. 01/27/16   Tonia Ghent, MD  escitalopram (LEXAPRO) 20 MG tablet Take 1 tablet (20 mg total) by mouth daily. 12/02/19   Tonia Ghent, MD  HYDROcodone-acetaminophen (NORCO/VICODIN) 5-325 MG tablet Take 1-2 tablets by mouth every 6 (six) hours as needed for severe pain (8 tabs or less per day). 12/26/19   Tonia Ghent, MD  HYDROmorphone (DILAUDID) 2 MG tablet Take 1 tablet (2 mg total) by mouth every 4 (four) hours as needed for severe pain (for kidney stones). 12/26/19   Tonia Ghent, MD  indapamide (LOZOL) 2.5 MG tablet Take 1 tablet (2.5 mg total) by mouth daily. 05/17/19   Tonia Ghent, MD  ondansetron (ZOFRAN ODT) 8 MG disintegrating tablet Take 1 tablet (8 mg total) by mouth every 8 (eight)  hours as needed for nausea or vomiting. 01/09/20   Camdan Burdi, Jenny Reichmann, MD  pantoprazole (PROTONIX) 40 MG tablet TAKE ONE TABLET BY MOUTH TWICE A DAY 07/23/19   Tonia Ghent, MD  polyethylene glycol powder (GLYCOLAX/MIRALAX) powder MIX 17G IN 4 TO 8 OUNCES OF FLUID AND TAKE TWICE DAILY 02/08/18   Tonia Ghent, MD  potassium citrate (UROCIT-K) 10 MEQ (1080 MG) SR tablet Take 1 tablet (10 mEq total) by mouth 2 (two) times daily. 05/17/19   Tonia Ghent, MD  tamsulosin (FLOMAX) 0.4 MG CAPS capsule Take 1 capsule (0.4 mg total) by mouth daily. 01/09/20   Braylynn Lewing, MD  zolpidem (AMBIEN) 10 MG tablet Take 1 tablet (10 mg total) by mouth at bedtime as needed. for sleep 11/19/19   Tonia Ghent, MD    Allergies Phenergan [promethazine hcl], Tape, Toradol [ketorolac tromethamine], Trileptal [oxcarbazepine], and Wellbutrin [bupropion]   REVIEW OF SYSTEMS  Negative except as noted here or in the History of Present Illness.   PHYSICAL EXAMINATION  Initial Vital Signs Blood pressure (!) 169/118, pulse 100, temperature 98 F (36.7 C), temperature source Oral, resp. rate 20, height 6' (1.829 m), weight (!) 156.5 kg, SpO2 97 %.  Examination General: Well-developed, obese male in no acute distress; appearance consistent with age of record HENT: normocephalic; atraumatic Eyes: Normal appearance Neck: supple Heart: regular rate and rhythm Lungs: clear to auscultation bilaterally Abdomen: soft; nondistended; left mid abdominal tenderness; bowel sounds present GU: Bilateral CVA tenderness Extremities: No deformity; full range of motion; pulses normal Neurologic: Awake, alert and oriented; motor function intact in all extremities and symmetric; no facial droop Skin: Warm and dry Psychiatric: Grimacing   RESULTS  Summary of this visit's results, reviewed and interpreted by myself:   EKG Interpretation  Date/Time:    Ventricular Rate:    PR Interval:    QRS Duration:   QT Interval:     QTC Calculation:   R Axis:     Text Interpretation:        Laboratory Studies: Results for orders placed or performed during the hospital encounter of 01/08/20 (from the past 24 hour(s))  Urinalysis, Routine w reflex microscopic Urine, Clean Catch     Status: Abnormal   Collection Time: 01/08/20 11:23 PM  Result Value Ref Range   Color, Urine YELLOW YELLOW   APPearance CLEAR CLEAR   Specific Gravity, Urine 1.020 1.005 - 1.030   pH 5.5 5.0 - 8.0   Glucose, UA NEGATIVE NEGATIVE mg/dL   Hgb urine dipstick MODERATE (A) NEGATIVE   Bilirubin Urine NEGATIVE NEGATIVE   Ketones, ur NEGATIVE NEGATIVE mg/dL   Protein, ur NEGATIVE NEGATIVE mg/dL  Nitrite NEGATIVE NEGATIVE   Leukocytes,Ua NEGATIVE NEGATIVE  Urinalysis, Microscopic (reflex)     Status: Abnormal   Collection Time: 01/08/20 11:23 PM  Result Value Ref Range   RBC / HPF 6-10 0 - 5 RBC/hpf   WBC, UA 0-5 0 - 5 WBC/hpf   Bacteria, UA RARE (A) NONE SEEN   Squamous Epithelial / LPF 0-5 0 - 5   Imaging Studies: CT Renal Stone Study  Result Date: 01/08/2020 CLINICAL DATA:  Bilateral flank pain. History of kidney stones. COVID diagnosis 8 days ago. EXAM: CT ABDOMEN AND PELVIS WITHOUT CONTRAST TECHNIQUE: Multidetector CT imaging of the abdomen and pelvis was performed following the standard protocol without IV contrast. COMPARISON:  Most recent CT 02/21/2018 FINDINGS: Lower chest: The lung bases are clear. No focal airspace disease. Hepatobiliary: Prominent size liver with elongated left lobe. Diffusely decreased hepatic density consistent with steatosis. No focal hepatic abnormality is seen. Clips in the gallbladder fossa postcholecystectomy. No biliary dilatation. Pancreas: No ductal dilatation or inflammation. Spleen: Normal in size without focal abnormality. Adrenals/Urinary Tract: No adrenal nodule. Obstructing 8 x 9 mm stone in the right proximal ureter (at the level of L3) with moderate hydroureteronephrosis and mild perinephric  edema. The more distal ureter is decompressed without additional stone. There is no left hydronephrosis. Punctate nonobstructing stones in the left kidney. No ureteral calculi. Urinary bladder is partially distended. Stomach/Bowel: Ingested material in the stomach. Normal positioning of the duodenum and ligament of Treitz. Small bowel primarily decompressed. No obstruction or inflammation. Appendix not visualized. History of appendectomy. Small to moderate colonic stool burden. There is no colonic wall thickening or pericolonic edema. Vascular/Lymphatic: Abdominal aorta is normal in caliber. Prominent periportal nodes likely reactive. Reproductive: Prostate is unremarkable. Other: No free air or ascites. Small fat containing umbilical hernia. Musculoskeletal: Spinal stimulator in place. There are no acute or suspicious osseous abnormalities. IMPRESSION: 1. Obstructing 8 x 9 mm stone in the right proximal ureter with moderate hydronephrosis and perinephric edema. 2. Punctate nonobstructing stones in the left kidney. 3. Hepatic steatosis. Electronically Signed   By: Keith Rake M.D.   On: 01/08/2020 23:46    ED COURSE and MDM  Nursing notes, initial and subsequent vitals signs, including pulse oximetry, reviewed and interpreted by myself.  Vitals:   01/08/20 2258 01/08/20 2300 01/09/20 0000 01/09/20 0010  BP:  (!) 169/118  124/90  Pulse:  100 93 96  Resp:  20  16  Temp:  98 F (36.7 C)    TempSrc:  Oral    SpO2:  97% 95% 96%  Weight: (!) 156.5 kg     Height: 6' (1.829 m)      Medications  HYDROmorphone (DILAUDID) injection 1 mg (has no administration in time range)  ondansetron (ZOFRAN) injection 4 mg (has no administration in time range)  ondansetron (ZOFRAN-ODT) disintegrating tablet 4 mg (4 mg Oral Given 01/08/20 2331)  HYDROmorphone (DILAUDID) injection 1 mg (1 mg Intravenous Given 01/08/20 2332)  HYDROmorphone (DILAUDID) injection 1 mg (1 mg Intravenous Given 01/09/20 0009)   12:35  AM Patient's pain down to 6 out of 10 after 2 mg of Dilaudid.  We will give an additional milligram of Dilaudid.  He is requesting no narcotic prescriptions as he is on pain management and found by contract.  He has a urologist, Armando Gang, with whom he will follow-up.  He was advised the stone is too large to pass on its own and he will likely require surgical intervention.  PROCEDURES  Procedures   ED DIAGNOSES     ICD-10-CM   1. Ureterolithiasis  N20.1        Lecil Tapp, MD 01/09/20 [phone]

## 2020-01-08 NOTE — ED Triage Notes (Signed)
Bilateral flank pain radiating down into his groin x 1 day. Hx of kidney stones-reports that it feels the same.   Pt dx with covid 8 days ago, denies acute symptoms at this time-does report ongoing loss of taste and smell.

## 2020-01-09 ENCOUNTER — Encounter: Payer: Self-pay | Admitting: Family Medicine

## 2020-01-09 LAB — URINALYSIS, ROUTINE W REFLEX MICROSCOPIC
Bilirubin Urine: NEGATIVE
Glucose, UA: NEGATIVE mg/dL
Ketones, ur: NEGATIVE mg/dL
Leukocytes,Ua: NEGATIVE
Nitrite: NEGATIVE
Protein, ur: NEGATIVE mg/dL
Specific Gravity, Urine: 1.02 (ref 1.005–1.030)
pH: 5.5 (ref 5.0–8.0)

## 2020-01-09 LAB — URINALYSIS, MICROSCOPIC (REFLEX)

## 2020-01-09 MED ORDER — TAMSULOSIN HCL 0.4 MG PO CAPS
0.4000 mg | ORAL_CAPSULE | Freq: Every day | ORAL | 0 refills | Status: DC
Start: 2020-01-09 — End: 2022-03-18

## 2020-01-09 MED ORDER — ONDANSETRON HCL 4 MG/2ML IJ SOLN
4.0000 mg | Freq: Once | INTRAMUSCULAR | Status: AC
  Administered 2020-01-09: 4 mg via INTRAVENOUS
  Filled 2020-01-09: qty 2

## 2020-01-09 MED ORDER — HYDROMORPHONE HCL 1 MG/ML IJ SOLN
1.0000 mg | Freq: Once | INTRAMUSCULAR | Status: AC
  Administered 2020-01-09: 1 mg via INTRAVENOUS
  Filled 2020-01-09: qty 1

## 2020-01-09 MED ORDER — ONDANSETRON 8 MG PO TBDP: 8.0000 mg | ORAL_TABLET | Freq: Three times a day (TID) | ORAL | 0 refills | Status: DC | PRN

## 2020-01-16 ENCOUNTER — Telehealth: Payer: Self-pay | Admitting: Family Medicine

## 2020-01-16 ENCOUNTER — Other Ambulatory Visit: Payer: Self-pay

## 2020-01-16 ENCOUNTER — Emergency Department (HOSPITAL_BASED_OUTPATIENT_CLINIC_OR_DEPARTMENT_OTHER)
Admission: EM | Admit: 2020-01-16 | Discharge: 2020-01-16 | Disposition: A | Discharge status: Home / Self Care | Payer: Self-pay | Attending: Emergency Medicine | Admitting: Emergency Medicine

## 2020-01-16 ENCOUNTER — Telehealth (INDEPENDENT_AMBULATORY_CARE_PROVIDER_SITE_OTHER): Payer: HRSA Program | Admitting: Family Medicine

## 2020-01-16 ENCOUNTER — Encounter (HOSPITAL_BASED_OUTPATIENT_CLINIC_OR_DEPARTMENT_OTHER): Payer: Self-pay | Admitting: Emergency Medicine

## 2020-01-16 VITALS — Ht 72.0 in | Wt 365.0 lb

## 2020-01-16 DIAGNOSIS — K219 Gastro-esophageal reflux disease without esophagitis: Secondary | ICD-10-CM | POA: Insufficient documentation

## 2020-01-16 DIAGNOSIS — G894 Chronic pain syndrome: Secondary | ICD-10-CM | POA: Diagnosis not present

## 2020-01-16 DIAGNOSIS — F419 Anxiety disorder, unspecified: Secondary | ICD-10-CM | POA: Diagnosis not present

## 2020-01-16 DIAGNOSIS — G8929 Other chronic pain: Secondary | ICD-10-CM | POA: Insufficient documentation

## 2020-01-16 DIAGNOSIS — N132 Hydronephrosis with renal and ureteral calculous obstruction: Secondary | ICD-10-CM | POA: Insufficient documentation

## 2020-01-16 DIAGNOSIS — N23 Unspecified renal colic: Secondary | ICD-10-CM

## 2020-01-16 DIAGNOSIS — F32A Depression, unspecified: Secondary | ICD-10-CM

## 2020-01-16 DIAGNOSIS — U071 COVID-19: Secondary | ICD-10-CM

## 2020-01-16 DIAGNOSIS — N200 Calculus of kidney: Secondary | ICD-10-CM | POA: Diagnosis not present

## 2020-01-16 HISTORY — DX: Chronic pain syndrome: G89.4

## 2020-01-16 MED ORDER — ONDANSETRON HCL 4 MG/2ML IJ SOLN
4.0000 mg | Freq: Once | INTRAMUSCULAR | Status: AC
  Administered 2020-01-16: 4 mg via INTRAVENOUS
  Filled 2020-01-16: qty 2

## 2020-01-16 MED ORDER — HYDROMORPHONE HCL 1 MG/ML IJ SOLN
2.0000 mg | Freq: Once | INTRAMUSCULAR | Status: AC
  Administered 2020-01-16: 2 mg via INTRAVENOUS
  Filled 2020-01-16: qty 2

## 2020-01-16 MED ORDER — HYDROMORPHONE HCL 1 MG/ML IJ SOLN
1.0000 mg | Freq: Once | INTRAMUSCULAR | Status: AC
  Administered 2020-01-16: 1 mg via INTRAVENOUS
  Filled 2020-01-16: qty 1

## 2020-01-16 MED ORDER — HYDROMORPHONE HCL 2 MG PO TABS: 2.0000 mg | ORAL_TABLET | ORAL | 0 refills | Status: DC | PRN

## 2020-01-16 NOTE — Telephone Encounter (Signed)
Please Advise
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
## 2020-01-16 NOTE — ED Notes (Signed)
Pt pending discharge due to pain medication, self driving.  Reports pain has returned 8/10, has pain medications utilizing at home after discharge.  Dr Sherry Ruffing made aware.  Pt will call for ride home now that he has been here later into the morning, family now awake and able to provide ride home.  Dr Sherry Ruffing agreed to order additional pain medication dose with ride home.

## 2020-01-16 NOTE — ED Provider Notes (Signed)
[facility] DEPT [facility] Provider Note: Georgena Spurling, MD, FACEP  CSN: [id number] MRN: [id number] ARRIVAL: 01/16/20 at [place]: [place]  Nephrolithiasis   HISTORY OF PRESENT ILLNESS  01/16/20 5:06 AM Casey Caldwell is a 39 y.o. male who was seen by myself at this facility for right flank pain on 01/08/2020.  He was diagnosed with an 8 x 9 mm stone in the right proximal ureter with moderate hydronephrosis and perinephric edema.  He was given analgesics in the ED but declined prescription as he is on pain management for chronic back pain and wishes not to compromise his contract.  Due to insurance limitations he will not be able to see his urologist, Dr. Rosana Hoes, until the new year.  He returns with an acute exacerbation of the right flank pain beginning yesterday evening about 8 PM.  The pain has worsened until this morning when it became unbearable.  He rates it as a 9 out of 10.  It is only minimally changed with movement position.  He has had nausea but no vomiting.  He has not noticed hematuria or dysuria.  His pain has not been relieved with his home narcotics.   Past Medical History:  Diagnosis Date  . Chronic pain syndrome   . Depression    and panic/anxiety  . GERD (gastroesophageal reflux disease)   . History of kidney stones   . Insomnia   . Kidney stones   . Seizures ([place]) Childhood    Past Surgical History:  Procedure Laterality Date  . APPENDECTOMY  2011  . CHOLECYSTECTOMY, LAPAROSCOPIC    . CYSTOSCOPY/URETEROSCOPY/HOLMIUM LASER Left 06/15/2017   Procedure: CYSTOSCOPY/LEFT URETEROSCOPYSTONE EXTRACTION/LEFT RETROGRADE;  Surgeon: Franchot Gallo, MD;  Location: [facility];  Service: Urology;  Laterality: Left;  With STENT  . HYDROCELE EXCISION Left 06/2012  . LAPAROSCOPIC CHOLECYSTECTOMY  1996  . LITHOTRIPSY     four times over the years; most recent 10/08/12  . ORCHIECTOMY Left 11/2012  . SPINAL CORD STIMULATOR INSERTION  2015  . TONSILLECTOMY   1992  . WRIST GANGLION EXCISION  2002   Right    Family History  Problem Relation Age of Onset  . Stroke Mother   . Liver disease Mother   . Irritable bowel syndrome Mother   . Kidney disease Mother   . Heart disease Father   . Colon polyps Father   . Colon cancer Neg Hx     Social History   Tobacco Use  . Smoking status: Never Smoker  . Smokeless tobacco: Never Used  Substance Use Topics  . Alcohol use: Not Currently    Alcohol/week: 0.0 standard drinks    Comment: rarely  . Drug use: No    Prior to Admission medications   Medication Sig Start Date End Date Taking? Authorizing Provider  albuterol (VENTOLIN HFA) 108 (90 Base) MCG/ACT inhaler Inhale 1-2 puffs into the lungs every 6 (six) hours as needed for wheezing or shortness of breath. 12/31/19   Joy, Shawn C, PA-C  allopurinol (ZYLOPRIM) 100 MG tablet Take 1 tablet (100 mg total) by mouth daily. 04/29/19   Hilts, Legrand Como, MD  ARIPiprazole (ABILIFY) 5 MG tablet Take 0.5 tablets (2.5 mg total) by mouth daily. 12/02/19   Tonia Ghent, MD  benzonatate (TESSALON) 100 MG capsule Take 1 capsule (100 mg total) by mouth every 8 (eight) hours. 12/31/19   Joy, Shawn C, PA-C  Colchicine 0.6 MG CAPS Take 1 tablet by mouth 2 (two) times daily  as needed. 03/18/19   Hilts, Legrand Como, MD  docusate sodium (COLACE) 100 MG capsule Take 1 capsule (100 mg total) by mouth 2 (two) times daily. 01/27/16   Tonia Ghent, MD  escitalopram (LEXAPRO) 20 MG tablet Take 1 tablet (20 mg total) by mouth daily. 12/02/19   Tonia Ghent, MD  HYDROcodone-acetaminophen (NORCO/VICODIN) 5-325 MG tablet Take 1-2 tablets by mouth every 6 (six) hours as needed for severe pain (8 tabs or less per day). 12/26/19   Tonia Ghent, MD  HYDROmorphone (DILAUDID) 2 MG tablet Take 1 tablet (2 mg total) by mouth every 4 (four) hours as needed for severe pain (for kidney stones). 12/26/19   Tonia Ghent, MD  indapamide (LOZOL) 2.5 MG tablet Take 1 tablet (2.5 mg total) by  mouth daily. 05/17/19   Tonia Ghent, MD  ondansetron (ZOFRAN ODT) 8 MG disintegrating tablet Take 1 tablet (8 mg total) by mouth every 8 (eight) hours as needed for nausea or vomiting. 01/09/20   Jalani Rominger, Jenny Reichmann, MD  pantoprazole (PROTONIX) 40 MG tablet TAKE ONE TABLET BY MOUTH TWICE A DAY 07/23/19   Tonia Ghent, MD  polyethylene glycol powder (GLYCOLAX/MIRALAX) powder MIX 17G IN 4 TO 8 OUNCES OF FLUID AND TAKE TWICE DAILY 02/08/18   Tonia Ghent, MD  potassium citrate (UROCIT-K) 10 MEQ (1080 MG) SR tablet Take 1 tablet (10 mEq total) by mouth 2 (two) times daily. 05/17/19   Tonia Ghent, MD  tamsulosin (FLOMAX) 0.4 MG CAPS capsule Take 1 capsule (0.4 mg total) by mouth daily. 01/09/20   Kaetlin Bullen, MD  zolpidem (AMBIEN) 10 MG tablet Take 1 tablet (10 mg total) by mouth at bedtime as needed. for sleep 11/19/19   Tonia Ghent, MD    Allergies Phenergan [promethazine hcl], Tape, Toradol [ketorolac tromethamine], Trileptal [oxcarbazepine], and Wellbutrin [bupropion]   REVIEW OF SYSTEMS  Negative except as noted here or in the History of Present Illness.   PHYSICAL EXAMINATION  Initial Vital Signs Blood pressure (!) 152/127, pulse 89, resp. rate (!) 22, height 6' (1.829 m), weight (!) 156.5 kg, SpO2 97 %.  Examination General: Well-developed, obese male in no acute distress; appearance consistent with age of record HENT: normocephalic; atraumatic Eyes: Normal appearance Neck: supple Heart: regular rate and rhythm Lungs: clear to auscultation bilaterally Abdomen: soft; nondistended; nontender; bowel sounds present GU: Right CVA tenderness Extremities: No deformity; full range of motion Neurologic: Awake, alert and oriented; motor function intact in all extremities and symmetric; no facial droop; tremulous Skin: Warm and dry Psychiatric: Grimacing   RESULTS  Summary of this visit's results, reviewed and interpreted by myself:   EKG Interpretation  Date/Time:     Ventricular Rate:    PR Interval:    QRS Duration:   QT Interval:    QTC Calculation:   R Axis:     Text Interpretation:        Laboratory Studies: No results found for this or any previous visit (from the past 24 hour(s)). Imaging Studies: No results found.  ED COURSE and MDM  Nursing notes, initial and subsequent vitals signs, including pulse oximetry, reviewed and interpreted by myself.  Vitals:   01/16/20 0500 01/16/20 0501 01/16/20 0600  BP: (!) 152/127  (!) 148/94  Pulse: 89  91  Resp: (!) 22  20  SpO2: 97%  97%  Weight:  (!) 156.5 kg   Height:  6' (1.829 m)    Medications  ondansetron (ZOFRAN) injection 4 mg (  4 mg Intravenous Given 01/16/20 0519)  HYDROmorphone (DILAUDID) injection 2 mg (2 mg Intravenous Given 01/16/20 0520)  HYDROmorphone (DILAUDID) injection 1 mg (1 mg Intravenous Given 01/16/20 0605)    6:44 AM Improved after 3 mg of Dilaudid total.  Nausea improved after Zofran.  Patient understands he cannot be given any prescriptions due to his pain management contract.  He does not tolerate Toradol.  PROCEDURES  Procedures   ED DIAGNOSES     ICD-10-CM   1. Ureteral colic  [id number]        Lisette Mancebo, MD 01/16/20 [phone]

## 2020-01-16 NOTE — ED Triage Notes (Signed)
Pt has known kidney stone. Present with right flank pain.

## 2020-01-16 NOTE — Addendum Note (Signed)
Addended by: Tonia Ghent on: 01/16/2020 12:52 PM   Modules accepted: Orders

## 2020-01-16 NOTE — Telephone Encounter (Signed)
Sent. Thanks.   

## 2020-01-16 NOTE — Telephone Encounter (Signed)
Patient called in needing a refill on the following prescription:  Hydromorphone (Dilaudid)  Pharmacy: [facility] at [place]

## 2020-01-21 ENCOUNTER — Other Ambulatory Visit: Payer: Self-pay

## 2020-01-21 ENCOUNTER — Emergency Department (HOSPITAL_COMMUNITY): Payer: Self-pay

## 2020-01-21 ENCOUNTER — Emergency Department (HOSPITAL_COMMUNITY)
Admission: EM | Admit: 2020-01-21 | Discharge: 2020-01-21 | Disposition: A | Discharge status: Home / Self Care | Payer: Self-pay | Attending: Emergency Medicine | Admitting: Emergency Medicine

## 2020-01-21 ENCOUNTER — Encounter: Payer: Self-pay | Admitting: Family Medicine

## 2020-01-21 ENCOUNTER — Encounter (HOSPITAL_COMMUNITY): Payer: Self-pay

## 2020-01-21 DIAGNOSIS — R000 Tachycardia, unspecified: Secondary | ICD-10-CM | POA: Insufficient documentation

## 2020-01-21 DIAGNOSIS — Z8616 Personal history of COVID-19: Secondary | ICD-10-CM | POA: Insufficient documentation

## 2020-01-21 DIAGNOSIS — K219 Gastro-esophageal reflux disease without esophagitis: Secondary | ICD-10-CM | POA: Insufficient documentation

## 2020-01-21 DIAGNOSIS — N201 Calculus of ureter: Secondary | ICD-10-CM | POA: Insufficient documentation

## 2020-01-21 LAB — CBC WITH DIFFERENTIAL/PLATELET
Abs Immature Granulocytes: 0.02 10*3/uL (ref 0.00–0.07)
Basophils Absolute: 0 10*3/uL (ref 0.0–0.1)
Basophils Relative: 0 %
Eosinophils Absolute: 0 10*3/uL (ref 0.0–0.5)
Eosinophils Relative: 0 %
HCT: 50.1 % (ref 39.0–52.0)
Hemoglobin: 17.5 g/dL — ABNORMAL HIGH (ref 13.0–17.0)
Immature Granulocytes: 0 %
Lymphocytes Relative: 14 %
Lymphs Abs: 1.2 10*3/uL (ref 0.7–4.0)
MCH: 31.2 pg (ref 26.0–34.0)
MCHC: 34.9 g/dL (ref 30.0–36.0)
MCV: 89.3 fL (ref 80.0–100.0)
Monocytes Absolute: 0.6 10*3/uL (ref 0.1–1.0)
Monocytes Relative: 6 %
Neutro Abs: 7.3 10*3/uL (ref 1.7–7.7)
Neutrophils Relative %: 80 %
Platelets: 374 10*3/uL (ref 150–400)
RBC: 5.61 MIL/uL (ref 4.22–5.81)
RDW: 13.4 % (ref 11.5–15.5)
WBC: 9.1 10*3/uL (ref 4.0–10.5)
nRBC: 0 % (ref 0.0–0.2)

## 2020-01-21 LAB — URINALYSIS, ROUTINE W REFLEX MICROSCOPIC
Bilirubin Urine: NEGATIVE
Glucose, UA: NEGATIVE mg/dL
Hgb urine dipstick: NEGATIVE
Ketones, ur: NEGATIVE mg/dL
Leukocytes,Ua: NEGATIVE
Nitrite: NEGATIVE
Protein, ur: NEGATIVE mg/dL
Specific Gravity, Urine: 1.014 (ref 1.005–1.030)
pH: 8 (ref 5.0–8.0)

## 2020-01-21 LAB — COMPREHENSIVE METABOLIC PANEL
ALT: 76 U/L — ABNORMAL HIGH (ref 0–44)
AST: 49 U/L — ABNORMAL HIGH (ref 15–41)
Albumin: 4.4 g/dL (ref 3.5–5.0)
Alkaline Phosphatase: 56 U/L (ref 38–126)
Anion gap: 12 (ref 5–15)
BUN: 13 mg/dL (ref 6–20)
CO2: 24 mmol/L (ref 22–32)
Calcium: 10 mg/dL (ref 8.9–10.3)
Chloride: 105 mmol/L (ref 98–111)
Creatinine, Ser: 0.99 mg/dL (ref 0.61–1.24)
GFR, Estimated: >60 mL/min (ref >60)
Glucose, Bld: 131 mg/dL — ABNORMAL HIGH (ref 70–99)
Potassium: 4 mmol/L (ref 3.5–5.1)
Sodium: 141 mmol/L (ref 135–145)
Total Bilirubin: 1.2 mg/dL (ref 0.3–1.2)
Total Protein: 7.3 g/dL (ref 6.5–8.1)

## 2020-01-21 MED ORDER — HYDROMORPHONE HCL 1 MG/ML IJ SOLN
1.0000 mg | Freq: Once | INTRAMUSCULAR | Status: AC
  Administered 2020-01-21: 1 mg via INTRAVENOUS
  Filled 2020-01-21: qty 1

## 2020-01-21 MED ORDER — SODIUM CHLORIDE 0.9 % IV BOLUS
1000.0000 mL | Freq: Once | INTRAVENOUS | Status: AC
  Administered 2020-01-21: 1000 mL via INTRAVENOUS

## 2020-01-21 MED ORDER — HYDROMORPHONE HCL 1 MG/ML IJ SOLN
1.0000 mg | Freq: Once | INTRAMUSCULAR | Status: AC
Start: 2020-01-21 — End: 2020-01-21
  Administered 2020-01-21: 1 mg via INTRAVENOUS
  Filled 2020-01-21: qty 1

## 2020-01-21 MED ORDER — HYDROMORPHONE HCL 2 MG PO TABS
4.0000 mg | ORAL_TABLET | Freq: Once | ORAL | Status: AC
  Administered 2020-01-21: 4 mg via ORAL
  Filled 2020-01-21: qty 2

## 2020-01-21 MED ORDER — ONDANSETRON HCL 4 MG/2ML IJ SOLN
4.0000 mg | Freq: Once | INTRAMUSCULAR | Status: AC
  Administered 2020-01-21: 4 mg via INTRAVENOUS
  Filled 2020-01-21: qty 2

## 2020-01-21 MED ORDER — IBUPROFEN 800 MG PO TABS
800.0000 mg | ORAL_TABLET | Freq: Once | ORAL | Status: AC
  Administered 2020-01-21: 800 mg via ORAL
  Filled 2020-01-21: qty 1

## 2020-01-21 NOTE — ED Provider Notes (Signed)
[facility] DEPT Provider Note   CSN: [id number] Arrival date & time: 01/21/20  1114     History Chief Complaint  Patient presents with  . Flank Pain    Casey Caldwell is a 39 y.o. male.  HPI 39 year old male with a history of kidney stones presents with severe right flank and abdominal pain.  Had the same pain last week, pain got better with treatment in the emergency department and then he had been at home taking his chronic pain meds.  Has had low level amount of pain but it got worse last night.  Nausea, vomiting, and abdominal pain with flank pain.  No dysuria.  No fevers but some chills.  Pain is severe.  Unable to see the urologist due to insurance issues.   Past Medical History:  Diagnosis Date  . Chronic pain syndrome   . Depression    and panic/anxiety  . GERD (gastroesophageal reflux disease)   . History of kidney stones   . Insomnia   . Kidney stones   . Seizures ([place]) Childhood    Patient Active Problem List   Diagnosis Date Noted  . COVID-19 01/05/2020  . Chronic hip pain, right 04/29/2019  . Right groin pain 04/29/2019  . Spinal cord stimulator status 02/28/2019  . Lumbar radiculopathy 02/28/2019  . Gout 01/13/2019  . Rectal pain 07/15/2018  . Plantar fasciitis 08/14/2017  . Encounter for chronic pain management 01/28/2016  . Chronic pain syndrome 10/30/2015  . Adenomatous colon polyp 06/06/2012  . Nephrolithiasis 03/14/2012  . Polycythemia 03/14/2012  . Groin pain, chronic, left 03/14/2012  . RUQ pain 03/14/2012  . Insomnia 03/14/2012  . Seizure disorder ([place]) 03/14/2012  . Anxiety and depression 03/14/2012    Past Surgical History:  Procedure Laterality Date  . APPENDECTOMY  2011  . CHOLECYSTECTOMY, LAPAROSCOPIC    . CYSTOSCOPY/URETEROSCOPY/HOLMIUM LASER Left 06/15/2017   Procedure: CYSTOSCOPY/LEFT URETEROSCOPYSTONE EXTRACTION/LEFT RETROGRADE;  Surgeon: Franchot Gallo, MD;  Location: [facility];  Service:  Urology;  Laterality: Left;  With STENT  . HYDROCELE EXCISION Left 06/2012  . LAPAROSCOPIC CHOLECYSTECTOMY  1996  . LITHOTRIPSY     four times over the years; most recent 10/08/12  . ORCHIECTOMY Left 11/2012  . SPINAL CORD STIMULATOR INSERTION  2015  . TONSILLECTOMY  1992  . WRIST GANGLION EXCISION  2002   Right       Family History  Problem Relation Age of Onset  . Stroke Mother   . Liver disease Mother   . Irritable bowel syndrome Mother   . Kidney disease Mother   . Heart disease Father   . Colon polyps Father   . Colon cancer Neg Hx     Social History   Tobacco Use  . Smoking status: Never Smoker  . Smokeless tobacco: Never Used  Substance Use Topics  . Alcohol use: Not Currently    Alcohol/week: 0.0 standard drinks    Comment: rarely  . Drug use: No    Home Medications Prior to Admission medications   Medication Sig Start Date End Date Taking? Authorizing Provider  albuterol (VENTOLIN HFA) 108 (90 Base) MCG/ACT inhaler Inhale 1-2 puffs into the lungs every 6 (six) hours as needed for wheezing or shortness of breath. 12/31/19  Yes Joy, Shawn C, PA-C  allopurinol (ZYLOPRIM) 100 MG tablet Take 1 tablet (100 mg total) by mouth daily. 04/29/19  Yes Hilts, Legrand Como, MD  ARIPiprazole (ABILIFY) 5 MG tablet Take 0.5 tablets (2.5 mg total) by mouth daily.  12/02/19  Yes Joaquim Nam, MD  Colchicine 0.6 MG CAPS Take 1 tablet by mouth 2 (two) times daily as needed. Patient taking differently: Take 1 tablet by mouth 2 (two) times daily as needed (gout flare). 03/18/19  Yes Hilts, Casimiro Needle, MD  docusate sodium (COLACE) 100 MG capsule Take 1 capsule (100 mg total) by mouth 2 (two) times daily. Patient taking differently: Take 100 mg by mouth 2 (two) times daily as needed for mild constipation. 01/27/16  Yes Joaquim Nam, MD  escitalopram (LEXAPRO) 20 MG tablet Take 1 tablet (20 mg total) by mouth daily. 12/02/19  Yes Joaquim Nam, MD  HYDROcodone-acetaminophen (NORCO/VICODIN)  5-325 MG tablet Take 1-2 tablets by mouth every 6 (six) hours as needed for severe pain (8 tabs or less per day). 12/26/19  Yes Joaquim Nam, MD  HYDROmorphone (DILAUDID) 2 MG tablet Take 1 tablet (2 mg total) by mouth every 4 (four) hours as needed for severe pain (for kidney stones). 01/16/20  Yes Joaquim Nam, MD  indapamide (LOZOL) 2.5 MG tablet Take 1 tablet (2.5 mg total) by mouth daily. 05/17/19  Yes Joaquim Nam, MD  Melatonin 10 MG TABS Take 10 mg by mouth at bedtime.   Yes [provider]  ondansetron (ZOFRAN ODT) 8 MG disintegrating tablet Take 1 tablet (8 mg total) by mouth every 8 (eight) hours as needed for nausea or vomiting. 01/09/20  Yes Molpus, John, MD  pantoprazole (PROTONIX) 40 MG tablet TAKE ONE TABLET BY MOUTH TWICE A DAY Patient taking differently: Take 40 mg by mouth 2 (two) times daily. 07/23/19  Yes Joaquim Nam, MD  polyethylene glycol powder (GLYCOLAX/MIRALAX) powder MIX 17G IN 4 TO 8 OUNCES OF FLUID AND TAKE TWICE DAILY Patient taking differently: Take 17 g by mouth 2 (two) times daily as needed for mild constipation. MIX 17G IN 4 TO 8 OUNCES OF FLUID AND TAKE TWICE DAILY 02/08/18  Yes Joaquim Nam, MD  potassium citrate (UROCIT-K) 10 MEQ (1080 MG) SR tablet Take 1 tablet (10 mEq total) by mouth 2 (two) times daily. 05/17/19  Yes Joaquim Nam, MD  tamsulosin (FLOMAX) 0.4 MG CAPS capsule Take 1 capsule (0.4 mg total) by mouth daily. 01/09/20  Yes Molpus, John, MD  zolpidem (AMBIEN) 10 MG tablet Take 1 tablet (10 mg total) by mouth at bedtime as needed. for sleep 11/19/19  Yes Joaquim Nam, MD  benzonatate (TESSALON) 100 MG capsule Take 1 capsule (100 mg total) by mouth every 8 (eight) hours. Patient not taking: Reported on 01/21/2020 12/31/19   Anselm Pancoast, PA-C    Allergies    Phenergan [promethazine hcl], Tape, Toradol [ketorolac tromethamine], Trileptal [oxcarbazepine], and Wellbutrin [bupropion]  Review of Systems   Review of  Systems  Constitutional: Positive for chills. Negative for fever.  Gastrointestinal: Positive for abdominal pain, nausea and vomiting.  Genitourinary: Positive for flank pain.  All other systems reviewed and are negative.   Physical Exam Updated Vital Signs BP (!) 170/105 (BP Location: Right Arm)   Pulse 73   Temp 98.3 F (36.8 C) (Oral)   Resp 11   SpO2 93%   Physical Exam Vitals and nursing note reviewed.  Constitutional:      Appearance: He is well-developed and well-nourished. He is obese. He is not ill-appearing or diaphoretic.  HENT:     Head: Normocephalic and atraumatic.     Right Ear: External ear normal.     Left Ear: External ear normal.  Nose: Nose normal.  Eyes:     General:        Right eye: No discharge.        Left eye: No discharge.  Cardiovascular:     Rate and Rhythm: Regular rhythm. Tachycardia present.     Heart sounds: Normal heart sounds.  Pulmonary:     Effort: Pulmonary effort is normal.     Breath sounds: Normal breath sounds.  Abdominal:     Palpations: Abdomen is soft.     Tenderness: There is generalized abdominal tenderness. There is right CVA tenderness. There is no left CVA tenderness.  Musculoskeletal:        General: No edema.     Cervical back: Neck supple.  Skin:    General: Skin is warm and dry.  Neurological:     Mental Status: He is alert.  Psychiatric:        Mood and Affect: Mood is not anxious.     ED Results / Procedures / Treatments   Labs (all labs ordered are listed, but only abnormal results are displayed) Labs Reviewed  COMPREHENSIVE METABOLIC PANEL - Abnormal; Notable for the following components:      Result Value   Glucose, Bld 131 (*)    AST 49 (*)    ALT 76 (*)    All other components within normal limits  CBC WITH DIFFERENTIAL/PLATELET - Abnormal; Notable for the following components:   Hemoglobin 17.5 (*)    All other components within normal limits  URINALYSIS, ROUTINE W REFLEX MICROSCOPIC     EKG None  Radiology DG Abdomen 1 View  Result Date: 01/21/2020 CLINICAL DATA:  Right ureteral stone. EXAM: ABDOMEN - 1 VIEW COMPARISON:  CT 01/08/2020. FINDINGS: Surgical clips right upper quadrant. Right ureteral stone is difficult to visualize but is most likely still present in the right mid ureter. Left renal stones best identified by prior CT. Pelvic calcifications consistent with phleboliths again noted. No bowel distention. No free air. Neurostimulator noted with lead tips over the thoracic canal. IMPRESSION: Right ureteral stone is difficult to visualize but is most likely still present in the right mid ureter. Electronically Signed   By: Marcello Moores  Register   On: 01/21/2020 12:40    Procedures Procedures (including critical care time)  Medications Ordered in ED Medications  HYDROmorphone (DILAUDID) injection 1 mg (1 mg Intravenous Given 01/21/20 1212)  ondansetron (ZOFRAN) injection 4 mg (4 mg Intravenous Given 01/21/20 1212)  sodium chloride 0.9 % bolus 1,000 mL (0 mLs Intravenous Stopped 01/21/20 1509)  HYDROmorphone (DILAUDID) injection 1 mg (1 mg Intravenous Given 01/21/20 1336)  HYDROmorphone (DILAUDID) injection 1 mg (1 mg Intravenous Given 01/21/20 1423)  ibuprofen (ADVIL) tablet 800 mg (800 mg Oral Given 01/21/20 1423)  HYDROmorphone (DILAUDID) tablet 4 mg (4 mg Oral Given 01/21/20 1504)    ED Course  I have reviewed the triage vital signs and the nursing notes.  Pertinent labs & imaging results that were available during my care of the patient were reviewed by me and considered in my medical decision making (see chart for details).    MDM Rules/Calculators/A&P                          Discussed with Dr. Tresa Moore of urology. He states patient should go home with outpatient urology follow up. Patient's pain is somewhat controlled. No vomiting. Cannot take toradol but can take Ibuprofen. Will give home po dilaudid and discharge with outpatient  follow up with urology  next week. Discussed return precautions.  Final Clinical Impression(s) / ED Diagnoses Final diagnoses:  Right ureteral stone    Rx / DC Orders ED Discharge Orders    None       Pricilla Loveless, MD 01/21/20 1520

## 2020-01-21 NOTE — ED Triage Notes (Signed)
Pt presents with c/o right side flank pain. Pt reports he was diagnosed with a 9 mm kidney stones last week. Pt has been unable to follow up with a urologist as he reports he does not have insurance.
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
## 2020-01-21 NOTE — Assessment & Plan Note (Signed)
   Mood discussed with patient.  His mood is better.  No suicidal or homicidal intent and routine cautions given to patient.  Stressors discussed as above.

## 2020-01-21 NOTE — Assessment & Plan Note (Signed)
Nephrolithiasis, with need for urology follow-up.  We will work on setting that up.  Continue drinking plenty fluids in the meantime.  He agrees with plan

## 2020-01-21 NOTE — Assessment & Plan Note (Signed)
Recent Covid infection with significant improvement after infusion treatment.  Routine cautions given to patient.  He is not in distress during conversation.

## 2020-01-21 NOTE — Discharge Instructions (Signed)
If you develop fever, vomiting, intractable pain, or any other new/concerning symptoms then return to the ER or call 911

## 2020-01-26 ENCOUNTER — Other Ambulatory Visit: Payer: Self-pay

## 2020-01-26 ENCOUNTER — Emergency Department (HOSPITAL_COMMUNITY)
Admission: EM | Admit: 2020-01-26 | Discharge: 2020-01-26 | Disposition: A | Discharge status: Home / Self Care | Payer: Self-pay | Attending: Emergency Medicine | Admitting: Emergency Medicine

## 2020-01-26 ENCOUNTER — Encounter (HOSPITAL_COMMUNITY): Payer: Self-pay | Admitting: Emergency Medicine

## 2020-01-26 ENCOUNTER — Encounter (HOSPITAL_BASED_OUTPATIENT_CLINIC_OR_DEPARTMENT_OTHER): Payer: Self-pay | Admitting: *Deleted

## 2020-01-26 DIAGNOSIS — Z79899 Other long term (current) drug therapy: Secondary | ICD-10-CM | POA: Insufficient documentation

## 2020-01-26 DIAGNOSIS — N23 Unspecified renal colic: Secondary | ICD-10-CM | POA: Insufficient documentation

## 2020-01-26 DIAGNOSIS — N200 Calculus of kidney: Secondary | ICD-10-CM | POA: Insufficient documentation

## 2020-01-26 DIAGNOSIS — R110 Nausea: Secondary | ICD-10-CM | POA: Insufficient documentation

## 2020-01-26 DIAGNOSIS — K219 Gastro-esophageal reflux disease without esophagitis: Secondary | ICD-10-CM | POA: Insufficient documentation

## 2020-01-26 DIAGNOSIS — Z20822 Contact with and (suspected) exposure to covid-19: Secondary | ICD-10-CM | POA: Insufficient documentation

## 2020-01-26 DIAGNOSIS — R102 Pelvic and perineal pain: Secondary | ICD-10-CM | POA: Insufficient documentation

## 2020-01-26 DIAGNOSIS — Z9049 Acquired absence of other specified parts of digestive tract: Secondary | ICD-10-CM | POA: Insufficient documentation

## 2020-01-26 DIAGNOSIS — Z8616 Personal history of COVID-19: Secondary | ICD-10-CM | POA: Insufficient documentation

## 2020-01-26 DIAGNOSIS — Z5321 Procedure and treatment not carried out due to patient leaving prior to being seen by health care provider: Secondary | ICD-10-CM | POA: Insufficient documentation

## 2020-01-26 LAB — BASIC METABOLIC PANEL
Anion gap: 9 (ref 5–15)
BUN: 11 mg/dL (ref 6–20)
CO2: 25 mmol/L (ref 22–32)
Calcium: 9.3 mg/dL (ref 8.9–10.3)
Chloride: 106 mmol/L (ref 98–111)
Creatinine, Ser: 0.94 mg/dL (ref 0.61–1.24)
GFR, Estimated: >60 mL/min (ref >60)
Glucose, Bld: 117 mg/dL — ABNORMAL HIGH (ref 70–99)
Potassium: 4.2 mmol/L (ref 3.5–5.1)
Sodium: 140 mmol/L (ref 135–145)

## 2020-01-26 LAB — CBC
HCT: 50.5 % (ref 39.0–52.0)
Hemoglobin: 17.4 g/dL — ABNORMAL HIGH (ref 13.0–17.0)
MCH: 30.7 pg (ref 26.0–34.0)
MCHC: 34.5 g/dL (ref 30.0–36.0)
MCV: 89.2 fL (ref 80.0–100.0)
Platelets: 328 10*3/uL (ref 150–400)
RBC: 5.66 MIL/uL (ref 4.22–5.81)
RDW: 13.5 % (ref 11.5–15.5)
WBC: 6.6 10*3/uL (ref 4.0–10.5)
nRBC: 0 % (ref 0.0–0.2)

## 2020-01-26 MED ORDER — ONDANSETRON 4 MG PO TBDP
4.0000 mg | ORAL_TABLET | Freq: Once | ORAL | Status: DC
  Filled 2020-01-26: qty 1

## 2020-01-26 NOTE — ED Triage Notes (Addendum)
Pt reports he has kidneys stones. Was seen here for same approx 1 week ago. States he is unable to f/u with urology due to insurance issues. He was at [facility] earlier but left due to wait. He took 2 percocet prior to coming to ED

## 2020-01-26 NOTE — ED Notes (Signed)
Per NT, patient turned in labels and reports he is leaving.

## 2020-01-26 NOTE — ED Triage Notes (Signed)
Patient has had R flank/pelvic pain x3 weeks, states he has a known 'large' kidney stone, but does not have insurance so he cannot F/U w/ urology. No emesis, reports nausea.

## 2020-01-27 ENCOUNTER — Encounter (HOSPITAL_COMMUNITY)
Admission: EM | Disposition: A | Discharge status: Home / Self Care | Payer: Self-pay | Source: Home / Self Care | Attending: Emergency Medicine

## 2020-01-27 ENCOUNTER — Encounter: Payer: Self-pay | Admitting: Family Medicine

## 2020-01-27 ENCOUNTER — Emergency Department (HOSPITAL_COMMUNITY): Payer: Self-pay | Admitting: Certified Registered"

## 2020-01-27 ENCOUNTER — Ambulatory Visit (HOSPITAL_COMMUNITY)
Admission: EM | Admit: 2020-01-27 | Discharge: 2020-01-27 | Disposition: A | Discharge status: Home / Self Care | Payer: Self-pay | Attending: Emergency Medicine | Admitting: Emergency Medicine

## 2020-01-27 ENCOUNTER — Emergency Department (HOSPITAL_BASED_OUTPATIENT_CLINIC_OR_DEPARTMENT_OTHER): Payer: Self-pay

## 2020-01-27 ENCOUNTER — Encounter (HOSPITAL_COMMUNITY): Payer: Self-pay

## 2020-01-27 ENCOUNTER — Emergency Department (HOSPITAL_BASED_OUTPATIENT_CLINIC_OR_DEPARTMENT_OTHER)
Admission: EM | Admit: 2020-01-27 | Discharge: 2020-01-27 | Disposition: A | Discharge status: Home / Self Care | Payer: Self-pay | Attending: Emergency Medicine | Admitting: Emergency Medicine

## 2020-01-27 ENCOUNTER — Emergency Department (HOSPITAL_COMMUNITY): Payer: Self-pay

## 2020-01-27 DIAGNOSIS — Z888 Allergy status to other drugs, medicaments and biological substances status: Secondary | ICD-10-CM | POA: Insufficient documentation

## 2020-01-27 DIAGNOSIS — N23 Unspecified renal colic: Secondary | ICD-10-CM

## 2020-01-27 DIAGNOSIS — Z87442 Personal history of urinary calculi: Secondary | ICD-10-CM | POA: Insufficient documentation

## 2020-01-27 DIAGNOSIS — N132 Hydronephrosis with renal and ureteral calculous obstruction: Secondary | ICD-10-CM | POA: Insufficient documentation

## 2020-01-27 HISTORY — PX: CYSTOSCOPY W/ URETERAL STENT PLACEMENT: SHX1429

## 2020-01-27 LAB — CBC WITH DIFFERENTIAL/PLATELET
Abs Immature Granulocytes: 0.04 10*3/uL (ref 0.00–0.07)
Basophils Absolute: 0.1 10*3/uL (ref 0.0–0.1)
Basophils Relative: 1 %
Eosinophils Absolute: 0.4 10*3/uL (ref 0.0–0.5)
Eosinophils Relative: 6 %
HCT: 47.6 % (ref 39.0–52.0)
Hemoglobin: 16.1 g/dL (ref 13.0–17.0)
Immature Granulocytes: 1 %
Lymphocytes Relative: 31 %
Lymphs Abs: 2.3 10*3/uL (ref 0.7–4.0)
MCH: 31.1 pg (ref 26.0–34.0)
MCHC: 33.8 g/dL (ref 30.0–36.0)
MCV: 92.1 fL (ref 80.0–100.0)
Monocytes Absolute: 1 10*3/uL (ref 0.1–1.0)
Monocytes Relative: 13 %
Neutro Abs: 3.5 10*3/uL (ref 1.7–7.7)
Neutrophils Relative %: 48 %
Platelets: 304 10*3/uL (ref 150–400)
RBC: 5.17 MIL/uL (ref 4.22–5.81)
RDW: 13.9 % (ref 11.5–15.5)
WBC: 7.3 10*3/uL (ref 4.0–10.5)
nRBC: 0 % (ref 0.0–0.2)

## 2020-01-27 LAB — URINALYSIS, MICROSCOPIC (REFLEX)
Bacteria, UA: NONE SEEN
Squamous Epithelial / HPF: NONE SEEN (ref 0–5)

## 2020-01-27 LAB — BASIC METABOLIC PANEL
Anion gap: 10 (ref 5–15)
BUN: 16 mg/dL (ref 6–20)
CO2: 29 mmol/L (ref 22–32)
Calcium: 9.4 mg/dL (ref 8.9–10.3)
Chloride: 103 mmol/L (ref 98–111)
Creatinine, Ser: 0.98 mg/dL (ref 0.61–1.24)
GFR, Estimated: >60 mL/min (ref >60)
Glucose, Bld: 110 mg/dL — ABNORMAL HIGH (ref 70–99)
Potassium: 4 mmol/L (ref 3.5–5.1)
Sodium: 142 mmol/L (ref 135–145)

## 2020-01-27 LAB — RESP PANEL BY RT-PCR (FLU A&B, COVID) ARPGX2
Influenza A by PCR: NEGATIVE
Influenza B by PCR: NEGATIVE
SARS Coronavirus 2 by RT PCR: NEGATIVE

## 2020-01-27 LAB — URINALYSIS, ROUTINE W REFLEX MICROSCOPIC
Bilirubin Urine: NEGATIVE
Glucose, UA: NEGATIVE mg/dL
Ketones, ur: NEGATIVE mg/dL
Leukocytes,Ua: NEGATIVE
Nitrite: NEGATIVE
Protein, ur: NEGATIVE mg/dL
Specific Gravity, Urine: 1.02 (ref 1.005–1.030)
pH: 5 (ref 5.0–8.0)

## 2020-01-27 SURGERY — CYSTOSCOPY, WITH RETROGRADE PYELOGRAM AND URETERAL STENT INSERTION
Anesthesia: General | Laterality: Right

## 2020-01-27 MED ORDER — ACETAMINOPHEN 500 MG PO TABS
1000.0000 mg | ORAL_TABLET | Freq: Once | ORAL | Status: AC
  Administered 2020-01-27: 1000 mg via ORAL
  Filled 2020-01-27: qty 2

## 2020-01-27 MED ORDER — PROPOFOL 10 MG/ML IV BOLUS
INTRAVENOUS | Status: DC | PRN
  Administered 2020-01-27: 200 mg via INTRAVENOUS

## 2020-01-27 MED ORDER — IOHEXOL 300 MG/ML  SOLN
INTRAMUSCULAR | Status: DC | PRN
  Administered 2020-01-27: 35 mL via URETHRAL

## 2020-01-27 MED ORDER — FENTANYL CITRATE (PF) 100 MCG/2ML IJ SOLN: 25.0000 ug | INTRAMUSCULAR | Status: DC | PRN

## 2020-01-27 MED ORDER — LIDOCAINE 2% (20 MG/ML) 5 ML SYRINGE
INTRAMUSCULAR | Status: DC | PRN
  Administered 2020-01-27: 100 mg via INTRAVENOUS

## 2020-01-27 MED ORDER — MIDAZOLAM HCL 2 MG/2ML IJ SOLN
INTRAMUSCULAR | Status: AC
  Filled 2020-01-27: qty 2

## 2020-01-27 MED ORDER — MIDAZOLAM HCL 5 MG/5ML IJ SOLN
INTRAMUSCULAR | Status: DC | PRN
  Administered 2020-01-27: 2 mg via INTRAVENOUS

## 2020-01-27 MED ORDER — HYDROMORPHONE HCL 1 MG/ML IJ SOLN
0.2500 mg | INTRAMUSCULAR | Status: DC | PRN
  Administered 2020-01-27: 0.5 mg via INTRAVENOUS

## 2020-01-27 MED ORDER — OXYCODONE HCL 5 MG PO TABS
5.0000 mg | ORAL_TABLET | Freq: Once | ORAL | Status: AC | PRN
Start: 2020-01-27 — End: 2020-01-27

## 2020-01-27 MED ORDER — HYDROMORPHONE HCL 1 MG/ML IJ SOLN
INTRAMUSCULAR | Status: AC
  Administered 2020-01-27: 0.5 mg via INTRAVENOUS
  Filled 2020-01-27: qty 1

## 2020-01-27 MED ORDER — HYDROMORPHONE HCL 1 MG/ML IJ SOLN
INTRAMUSCULAR | Status: AC
  Filled 2020-01-27: qty 1

## 2020-01-27 MED ORDER — HYDROMORPHONE HCL 1 MG/ML IJ SOLN: 1.0000 mg | Freq: Once | INTRAMUSCULAR | Status: DC

## 2020-01-27 MED ORDER — PANTOPRAZOLE SODIUM 40 MG PO TBEC: 40.0000 mg | DELAYED_RELEASE_TABLET | Freq: Two times a day (BID) | ORAL | Status: DC

## 2020-01-27 MED ORDER — CEPHALEXIN 500 MG PO CAPS: 500.0000 mg | ORAL_CAPSULE | Freq: Three times a day (TID) | ORAL | 0 refills | Status: AC

## 2020-01-27 MED ORDER — PANTOPRAZOLE SODIUM 40 MG PO TBEC
40.0000 mg | DELAYED_RELEASE_TABLET | Freq: Two times a day (BID) | ORAL | Status: DC
  Administered 2020-01-27: 40 mg via ORAL
  Filled 2020-01-27: qty 1

## 2020-01-27 MED ORDER — PROPOFOL 10 MG/ML IV BOLUS
INTRAVENOUS | Status: AC
  Filled 2020-01-27: qty 20

## 2020-01-27 MED ORDER — LIDOCAINE HCL (PF) 2 % IJ SOLN
INTRAMUSCULAR | Status: AC
  Filled 2020-01-27: qty 5

## 2020-01-27 MED ORDER — SODIUM CHLORIDE 0.9 % IR SOLN
Status: DC | PRN
  Administered 2020-01-27: 3000 mL

## 2020-01-27 MED ORDER — DEXAMETHASONE SODIUM PHOSPHATE 10 MG/ML IJ SOLN
INTRAMUSCULAR | Status: DC | PRN
  Administered 2020-01-27: 8 mg via INTRAVENOUS

## 2020-01-27 MED ORDER — SODIUM CHLORIDE 0.9 % IV BOLUS
1000.0000 mL | Freq: Once | INTRAVENOUS | Status: AC
  Administered 2020-01-27: 1000 mL via INTRAVENOUS

## 2020-01-27 MED ORDER — HYDROMORPHONE HCL 1 MG/ML IJ SOLN
1.0000 mg | Freq: Once | INTRAMUSCULAR | Status: AC
  Administered 2020-01-27: 1 mg via INTRAVENOUS
  Filled 2020-01-27: qty 1

## 2020-01-27 MED ORDER — HYDROMORPHONE HCL 1 MG/ML IJ SOLN
1.0000 mg | Freq: Once | INTRAMUSCULAR | Status: AC
Start: 2020-01-27 — End: 2020-01-27
  Administered 2020-01-27: 1 mg via INTRAVENOUS
  Filled 2020-01-27: qty 1

## 2020-01-27 MED ORDER — ONDANSETRON HCL 4 MG/2ML IJ SOLN
4.0000 mg | Freq: Once | INTRAMUSCULAR | Status: AC
  Administered 2020-01-27: 4 mg via INTRAVENOUS
  Filled 2020-01-27: qty 2

## 2020-01-27 MED ORDER — OXYCODONE HCL 5 MG PO TABS
ORAL_TABLET | ORAL | Status: AC
  Filled 2020-01-27: qty 1

## 2020-01-27 MED ORDER — ONDANSETRON HCL 4 MG/2ML IJ SOLN
4.0000 mg | Freq: Once | INTRAMUSCULAR | Status: AC
  Administered 2020-01-27: 4 mg via INTRAVENOUS

## 2020-01-27 MED ORDER — DEXTROSE 5 % IV SOLN
3.0000 g | Freq: Once | INTRAVENOUS | Status: AC
  Administered 2020-01-27: 3 g via INTRAVENOUS
  Filled 2020-01-27: qty 3

## 2020-01-27 MED ORDER — LACTATED RINGERS IV SOLN: INTRAVENOUS | Status: DC

## 2020-01-27 MED ORDER — OXYCODONE-ACETAMINOPHEN 5-325 MG PO TABS
2.0000 | ORAL_TABLET | Freq: Once | ORAL | Status: AC
  Administered 2020-01-27: 2 via ORAL
  Filled 2020-01-27: qty 2

## 2020-01-27 MED ORDER — CHLORHEXIDINE GLUCONATE 0.12 % MT SOLN
15.0000 mL | Freq: Once | OROMUCOSAL | Status: AC
  Administered 2020-01-27: 15 mL via OROMUCOSAL

## 2020-01-27 MED ORDER — PHENYLEPHRINE 40 MCG/ML (10ML) SYRINGE FOR IV PUSH (FOR BLOOD PRESSURE SUPPORT)
PREFILLED_SYRINGE | INTRAVENOUS | Status: DC | PRN
  Administered 2020-01-27 (×2): 120 ug via INTRAVENOUS

## 2020-01-27 MED ORDER — FENTANYL CITRATE (PF) 100 MCG/2ML IJ SOLN
INTRAMUSCULAR | Status: DC | PRN
  Administered 2020-01-27: 100 ug via INTRAVENOUS

## 2020-01-27 MED ORDER — OXYCODONE HCL 5 MG PO TABS
ORAL_TABLET | ORAL | Status: AC
  Administered 2020-01-27: 5 mg via ORAL
  Filled 2020-01-27: qty 1

## 2020-01-27 MED ORDER — FENTANYL CITRATE (PF) 100 MCG/2ML IJ SOLN
INTRAMUSCULAR | Status: AC
  Filled 2020-01-27: qty 2

## 2020-01-27 SURGICAL SUPPLY — 13 items
BAG URO CATCHER STRL LF (MISCELLANEOUS) ×2 IMPLANT
CATH URET 5FR 28IN OPEN ENDED (CATHETERS) ×2 IMPLANT
CLOTH BEACON ORANGE TIMEOUT ST (SAFETY) IMPLANT
GLOVE SURG ENC TEXT LTX SZ7.5 (GLOVE) ×2 IMPLANT
GOWN STRL REUS W/TWL XL LVL3 (GOWN DISPOSABLE) ×2 IMPLANT
GUIDEWIRE STR DUAL SENSOR (WIRE) ×2 IMPLANT
GUIDEWIRE ZIPWRE .038 STRAIGHT (WIRE) ×2 IMPLANT
KIT TURNOVER KIT A (KITS) IMPLANT
MANIFOLD NEPTUNE II (INSTRUMENTS) ×2 IMPLANT
PACK CYSTO (CUSTOM PROCEDURE TRAY) ×2 IMPLANT
STENT URET 6FRX24 CONTOUR (STENTS) IMPLANT
STENT URET 6FRX26 CONTOUR (STENTS) ×2 IMPLANT
TUBING CONNECTING 10 (TUBING) ×2 IMPLANT

## 2020-01-27 NOTE — ED Provider Notes (Signed)
[facility] DEPT Provider Note   CSN: [id number] Arrival date & time: 01/27/20  1049     History Chief Complaint  Patient presents with  . Flank Pain    Casey Caldwell is a 40 y.o. male.  40 year old male with prior medical history as detailed below presents for evaluation of right-sided flank pain.  Patient reports that he was at [facility] earlier today for same complaint.  Imaging confirmed right sided UPJ stone.  Apparently the patient was to follow-up with Dr. Milford Cage for stenting later today.  Patient reports that he was unable to contact the urology office and decided to come to [facility], ED.  He complains of continued pain.  He denies fever.  His last p.o. intake was early this morning.  He reports a negative Covid test earlier today.  The history is provided by the patient and medical records.  Flank Pain This is a recurrent problem. The current episode started more than 1 week ago. The problem occurs constantly. The problem has not changed since onset.Nothing aggravates the symptoms. Nothing relieves the symptoms.       Past Medical History:  Diagnosis Date  . Chronic pain syndrome   . Depression    and panic/anxiety  . GERD (gastroesophageal reflux disease)   . History of kidney stones   . Insomnia   . Kidney stones   . Seizures ([place]) Childhood    Patient Active Problem List   Diagnosis Date Noted  . COVID-19 01/05/2020  . Chronic hip pain, right 04/29/2019  . Right groin pain 04/29/2019  . Spinal cord stimulator status 02/28/2019  . Lumbar radiculopathy 02/28/2019  . Gout 01/13/2019  . Rectal pain 07/15/2018  . Plantar fasciitis 08/14/2017  . Encounter for chronic pain management 01/28/2016  . Chronic pain syndrome 10/30/2015  . Adenomatous colon polyp 06/06/2012  . Nephrolithiasis 03/14/2012  . Polycythemia 03/14/2012  . Groin pain, chronic, left 03/14/2012  . RUQ pain 03/14/2012  . Insomnia 03/14/2012  .  Seizure disorder ([place]) 03/14/2012  . Anxiety and depression 03/14/2012    Past Surgical History:  Procedure Laterality Date  . APPENDECTOMY  2011  . CHOLECYSTECTOMY, LAPAROSCOPIC    . CYSTOSCOPY/URETEROSCOPY/HOLMIUM LASER Left 06/15/2017   Procedure: CYSTOSCOPY/LEFT URETEROSCOPYSTONE EXTRACTION/LEFT RETROGRADE;  Surgeon: Franchot Gallo, MD;  Location: [facility];  Service: Urology;  Laterality: Left;  With STENT  . HYDROCELE EXCISION Left 06/2012  . LAPAROSCOPIC CHOLECYSTECTOMY  1996  . LITHOTRIPSY     four times over the years; most recent 10/08/12  . ORCHIECTOMY Left 11/2012  . SPINAL CORD STIMULATOR INSERTION  2015  . TONSILLECTOMY  1992  . WRIST GANGLION EXCISION  2002   Right       Family History  Problem Relation Age of Onset  . Stroke Mother   . Liver disease Mother   . Irritable bowel syndrome Mother   . Kidney disease Mother   . Heart disease Father   . Colon polyps Father   . Colon cancer Neg Hx     Social History   Tobacco Use  . Smoking status: Never Smoker  . Smokeless tobacco: Never Used  Vaping Use  . Vaping Use: Never used  Substance Use Topics  . Alcohol use: Not Currently    Alcohol/week: 0.0 standard drinks    Comment: rarely  . Drug use: No    Home Medications Prior to Admission medications   Medication Sig Start Date End Date Taking? Authorizing Provider  albuterol (VENTOLIN HFA) 108 (90 Base) MCG/ACT inhaler Inhale 1-2 puffs into the lungs every 6 (six) hours as needed for wheezing or shortness of breath. 12/31/19   Joy, Shawn C, PA-C  allopurinol (ZYLOPRIM) 100 MG tablet Take 1 tablet (100 mg total) by mouth daily. 04/29/19   Hilts, Casimiro NeedleMichael, MD  ARIPiprazole (ABILIFY) 5 MG tablet Take 0.5 tablets (2.5 mg total) by mouth daily. 12/02/19   Joaquim Namuncan, Graham S, MD  benzonatate (TESSALON) 100 MG capsule Take 1 capsule (100 mg total) by mouth every 8 (eight) hours. Patient not taking: Reported on 01/21/2020 12/31/19   Anselm PancoastJoy, Shawn C, PA-C  Colchicine 0.6  MG CAPS Take 1 tablet by mouth 2 (two) times daily as needed. Patient taking differently: Take 1 tablet by mouth 2 (two) times daily as needed (gout flare). 03/18/19   Hilts, Casimiro NeedleMichael, MD  docusate sodium (COLACE) 100 MG capsule Take 1 capsule (100 mg total) by mouth 2 (two) times daily. Patient taking differently: Take 100 mg by mouth 2 (two) times daily as needed for mild constipation. 01/27/16   Joaquim Namuncan, Graham S, MD  escitalopram (LEXAPRO) 20 MG tablet Take 1 tablet (20 mg total) by mouth daily. 12/02/19   Joaquim Namuncan, Graham S, MD  HYDROcodone-acetaminophen (NORCO/VICODIN) 5-325 MG tablet Take 1-2 tablets by mouth every 6 (six) hours as needed for severe pain (8 tabs or less per day). 12/26/19   Joaquim Namuncan, Graham S, MD  HYDROmorphone (DILAUDID) 2 MG tablet Take 1 tablet (2 mg total) by mouth every 4 (four) hours as needed for severe pain (for kidney stones). 01/16/20   Joaquim Namuncan, Graham S, MD  indapamide (LOZOL) 2.5 MG tablet Take 1 tablet (2.5 mg total) by mouth daily. 05/17/19   Joaquim Namuncan, Graham S, MD  Melatonin 10 MG TABS Take 10 mg by mouth at bedtime.    [provider]  ondansetron (ZOFRAN ODT) 8 MG disintegrating tablet Take 1 tablet (8 mg total) by mouth every 8 (eight) hours as needed for nausea or vomiting. 01/09/20   Molpus, John, MD  pantoprazole (PROTONIX) 40 MG tablet TAKE ONE TABLET BY MOUTH TWICE A DAY Patient taking differently: Take 40 mg by mouth 2 (two) times daily. 07/23/19   Joaquim Namuncan, Graham S, MD  polyethylene glycol powder (GLYCOLAX/MIRALAX) powder MIX 17G IN 4 TO 8 OUNCES OF FLUID AND TAKE TWICE DAILY Patient taking differently: Take 17 g by mouth 2 (two) times daily as needed for mild constipation. MIX 17G IN 4 TO 8 OUNCES OF FLUID AND TAKE TWICE DAILY 02/08/18   Joaquim Namuncan, Graham S, MD  potassium citrate (UROCIT-K) 10 MEQ (1080 MG) SR tablet Take 1 tablet (10 mEq total) by mouth 2 (two) times daily. 05/17/19   Joaquim Namuncan, Graham S, MD  tamsulosin (FLOMAX) 0.4 MG CAPS capsule Take 1 capsule  (0.4 mg total) by mouth daily. 01/09/20   Molpus, John, MD  zolpidem (AMBIEN) 10 MG tablet Take 1 tablet (10 mg total) by mouth at bedtime as needed. for sleep 11/19/19   Joaquim Namuncan, Graham S, MD    Allergies    Phenergan [promethazine hcl], Tape, Toradol [ketorolac tromethamine], Trileptal [oxcarbazepine], and Wellbutrin [bupropion]  Review of Systems   Review of Systems  Genitourinary: Positive for flank pain.  All other systems reviewed and are negative.   Physical Exam Updated Vital Signs BP 140/85   Pulse 77   Temp 98.7 F (37.1 C) (Oral)   Resp 18   SpO2 94%   Physical Exam Vitals and nursing note reviewed.  Constitutional:  General: He is not in acute distress.    Appearance: Normal appearance. He is well-developed and well-nourished.  HENT:     Head: Normocephalic and atraumatic.     Mouth/Throat:     Mouth: Oropharynx is clear and moist.  Eyes:     Extraocular Movements: EOM normal.     Conjunctiva/sclera: Conjunctivae normal.     Pupils: Pupils are equal, round, and reactive to light.  Cardiovascular:     Rate and Rhythm: Normal rate and regular rhythm.     Heart sounds: Normal heart sounds.  Pulmonary:     Effort: Pulmonary effort is normal. No respiratory distress.     Breath sounds: Normal breath sounds.  Abdominal:     General: There is no distension.     Palpations: Abdomen is soft.     Tenderness: There is no abdominal tenderness.  Musculoskeletal:        General: No deformity or edema. Normal range of motion.     Cervical back: Normal range of motion and neck supple.  Skin:    General: Skin is warm and dry.  Neurological:     Mental Status: He is alert and oriented to person, place, and time.  Psychiatric:        Mood and Affect: Mood and affect normal.     ED Results / Procedures / Treatments   Labs (all labs ordered are listed, but only abnormal results are displayed) Labs Reviewed  BASIC METABOLIC PANEL - Abnormal; Notable for the  following components:      Result Value   Glucose, Bld 110 (*)    All other components within normal limits  CBC WITH DIFFERENTIAL/PLATELET  URINALYSIS, ROUTINE W REFLEX MICROSCOPIC    EKG None  Radiology DG Abdomen 1 View  Result Date: 01/27/2020 CLINICAL DATA:  Nephrolithiasis EXAM: ABDOMEN - 1 VIEW COMPARISON:  01/21/2020, CT 01/08/2020 FINDINGS: The previously noted right paraspinal calculus is not as well visualized on the current examination though the region is in part obscured by dorsal column stimulator battery pack. No additional nephro or urolithiasis identified. Normal abdominal gas pattern. Multiple phleboliths within the pelvis. Dorsal column stimulator lead and cholecystectomy clips again noted. IMPRESSION: No definite nephro or urolithiasis identified though the right paraspinal region is partially obscured by dorsal column stimulator device. Electronically Signed   By: Fidela Salisbury MD   On: 01/27/2020 03:34   CT Renal Stone Study  Result Date: 01/27/2020 CLINICAL DATA:  Flank pain.  Kidney stones suspected. EXAM: CT ABDOMEN AND PELVIS WITHOUT CONTRAST TECHNIQUE: Multidetector CT imaging of the abdomen and pelvis was performed following the standard protocol without IV contrast. COMPARISON:  01/08/2020 FINDINGS: Lower chest: Unremarkable. Hepatobiliary: The liver shows diffusely decreased attenuation suggesting fat deposition. Gallbladder is surgically absent. No intrahepatic or extrahepatic biliary dilation. Pancreas: No focal mass lesion. No dilatation of the main duct. No intraparenchymal cyst. No peripancreatic edema. Spleen: No splenomegaly. No focal mass lesion. Adrenals/Urinary Tract: No adrenal nodule or mass. Punctate stones are seen in the upper and lower poles of the left kidney. No left ureteral stone. No secondary changes in the left kidney or ureter. 9 x 8 x 9 mm stone is again noted at the right UPJ, similar location to prior study. Mild to moderate right  hydronephrosis is similar. No other visible stones in the right kidney or ureter. No bladder stones. Stomach/Bowel: Stomach is unremarkable. No gastric wall thickening. No evidence of outlet obstruction. Duodenum is normally positioned as is the ligament  of Treitz. No small bowel wall thickening. No small bowel dilatation. The terminal ileum is normal. The appendix is not visualized, but there is no edema or inflammation in the region of the cecum. No gross colonic mass. No colonic wall thickening. Vascular/Lymphatic: No abdominal aortic aneurysm. No abdominal aortic atherosclerotic calcification. There is no gastrohepatic or hepatoduodenal ligament lymphadenopathy. No retroperitoneal or mesenteric lymphadenopathy. No pelvic sidewall lymphadenopathy. Reproductive: The prostate gland and seminal vesicles are unremarkable. Other: No intraperitoneal free fluid. Musculoskeletal: Thoracolumbar spinal stimulator device evident. No worrisome lytic or sclerotic osseous abnormality. IMPRESSION: 1. 9 x 8 x 9 mm right UPJ stone with mild to moderate right hydronephrosis, similar to prior. 2. Punctate nonobstructing stones in the left kidney. 3. Hepatic steatosis. Electronically Signed   By: Kennith Center M.D.   On: 01/27/2020 05:17    Procedures Procedures (including critical care time)  Medications Ordered in ED Medications  HYDROmorphone (DILAUDID) injection 1 mg (1 mg Intravenous Given 01/27/20 1305)  sodium chloride 0.9 % bolus 1,000 mL (1,000 mLs Intravenous New Bag/Given 01/27/20 1304)  ondansetron (ZOFRAN) injection 4 mg (4 mg Intravenous Given 01/27/20 1305)    ED Course  I have reviewed the triage vital signs and the nursing notes.  Pertinent labs & imaging results that were available during my care of the patient were reviewed by me and considered in my medical decision making (see chart for details).    MDM Rules/Calculators/A&P                          MDM  Screen complete  MORDCHE HEDGLIN was  evaluated in Emergency Department on 01/27/2020 for the symptoms described in the history of present illness. He was evaluated in the context of the global COVID-19 pandemic, which necessitated consideration that the patient might be at risk for infection with the SARS-CoV-2 virus that causes COVID-19. Institutional protocols and algorithms that pertain to the evaluation of patients at risk for COVID-19 are in a state of rapid change based on information released by regulatory bodies including the CDC and federal and state organizations. These policies and algorithms were followed during the patient's care in the ED.   Patient is presenting for right renal colic.  Patient apparently was advised to come to the ED for likely stenting by urology.  See previous chart from [facility] from earlier today for additional details.  Patient's case discussed with Dr. Liliane Shi (Urology).  Will be evaluated by Dr. Liliane Shi for likely stenting.   Final Clinical Impression(s) / ED Diagnoses Final diagnoses:  Renal colic    Rx / DC Orders ED Discharge Orders    None       Wynetta Fines, MD 01/27/20 1435

## 2020-01-27 NOTE — ED Notes (Addendum)
Patient placed himself in the floor for comfort.  Encouraged to get off the floor.  Moved over to a two seater couch.

## 2020-01-27 NOTE — Anesthesia Procedure Notes (Signed)
Procedure Name: LMA Insertion Performed by: Mikal Wisman H, CRNA Pre-anesthesia Checklist: Patient identified, Emergency Drugs available, Suction available and Patient being monitored Patient Re-evaluated:Patient Re-evaluated prior to induction Oxygen Delivery Method: Circle System Utilized Preoxygenation: Pre-oxygenation with 100% oxygen Induction Type: IV induction Ventilation: Mask ventilation without difficulty LMA: LMA inserted LMA Size: 5.0 Number of attempts: 1 Airway Equipment and Method: Bite block Placement Confirmation: positive ETCO2 Tube secured with: Tape Dental Injury: Teeth and Oropharynx as per pre-operative assessment        

## 2020-01-27 NOTE — Anesthesia Preprocedure Evaluation (Addendum)
Anesthesia Evaluation  Patient identified by MRN, date of birth, ID band Patient awake    Reviewed: Allergy & Precautions, NPO status , Patient's Chart, lab work & pertinent test results  Airway Mallampati: II  TM Distance: >3 FB Neck ROM: Full    Dental no notable dental hx. (+) Teeth Intact, Dental Advisory Given   Pulmonary neg pulmonary ROS,    Pulmonary exam normal breath sounds clear to auscultation       Cardiovascular negative cardio ROS Normal cardiovascular exam Rhythm:Regular Rate:Normal     Neuro/Psych Seizures - (remote history), Well Controlled,  PSYCHIATRIC DISORDERS Anxiety Depression    GI/Hepatic Neg liver ROS, GERD  Controlled and Medicated,  Endo/Other  Morbid obesity (BMI 48)  Renal/GU negative Renal ROS  negative genitourinary   Musculoskeletal  (+) narcotic dependent  Abdominal   Peds  Hematology negative hematology ROS (+)   Anesthesia Other Findings Chronic pain s/p spinal cord stimulator  Reproductive/Obstetrics                            Anesthesia Physical Anesthesia Plan  ASA: III  Anesthesia Plan: General   Post-op Pain Management:    Induction: Intravenous  PONV Risk Score and Plan: 2 and Ondansetron, Dexamethasone and Midazolam  Airway Management Planned: LMA  Additional Equipment:   Intra-op Plan:   Post-operative Plan: Extubation in OR  Informed Consent: I have reviewed the patients History and Physical, chart, labs and discussed the procedure including the risks, benefits and alternatives for the proposed anesthesia with the patient or authorized representative who has indicated his/her understanding and acceptance.     Dental advisory given  Plan Discussed with: CRNA  Anesthesia Plan Comments:         Anesthesia Quick Evaluation

## 2020-01-27 NOTE — Transfer of Care (Signed)
Immediate Anesthesia Transfer of Care Note  Patient: Casey Caldwell  Procedure(s) Performed: CYSTOSCOPY WITH RETROGRADE PYELOGRAM/URETERAL STENT PLACEMENT (Right )  Patient Location: PACU  Anesthesia Type:General  Level of Consciousness: awake  Airway & Oxygen Therapy: Patient Spontanous Breathing and Patient connected to nasal cannula oxygen  Post-op Assessment: Report given to RN and Post -op Vital signs reviewed and stable  Post vital signs: Reviewed and stable  Last Vitals:  Vitals Value Taken Time  BP 164/109 01/27/20 1641  Temp    Pulse 87 01/27/20 1643  Resp 15 01/27/20 1643  SpO2 96 % 01/27/20 1643  Vitals shown include unvalidated device data.  Last Pain:  Vitals:   01/27/20 1521  TempSrc:   PainSc: 7       Patients Stated Pain Goal: 4 (01/27/20 1521)  Complications: No complications documented.

## 2020-01-27 NOTE — H&P (Signed)
Urology Preoperative H&P   Chief Complaint: Right flank pain  History of Present Illness: Casey Caldwell is a 40 y.o. male with with a history of kidney stones who presents to the [facility] emergency department with a 2-week history of intermittent episodes of sharp right flank pain that radiates down into the right inguinal region and is associated with nausea/vomiting.  CT stone study performed on 01/27/2020 revealed a 9 mm right proximal ureteral calculus with mild to moderate right-sided hydronephrosis.  He states that he is urinating without difficulty denies dysuria or hematuria.  He also denies fever/chills.  Past Medical History:  Diagnosis Date  . Chronic pain syndrome   . Depression    and panic/anxiety  . GERD (gastroesophageal reflux disease)   . History of kidney stones   . Insomnia   . Kidney stones   . Seizures ([place]) Childhood    Past Surgical History:  Procedure Laterality Date  . APPENDECTOMY  2011  . CHOLECYSTECTOMY, LAPAROSCOPIC    . CYSTOSCOPY/URETEROSCOPY/HOLMIUM LASER Left 06/15/2017   Procedure: CYSTOSCOPY/LEFT URETEROSCOPYSTONE EXTRACTION/LEFT RETROGRADE;  Surgeon: Franchot Gallo, MD;  Location: [facility];  Service: Urology;  Laterality: Left;  With STENT  . HYDROCELE EXCISION Left 06/2012  . LAPAROSCOPIC CHOLECYSTECTOMY  1996  . LITHOTRIPSY     four times over the years; most recent 10/08/12  . ORCHIECTOMY Left 11/2012  . SPINAL CORD STIMULATOR INSERTION  2015  . TONSILLECTOMY  1992  . WRIST GANGLION EXCISION  2002   Right    Allergies:  Allergies  Allergen Reactions  . Phenergan [Promethazine Hcl]   . Tape Hives    Adhesive tape  . Toradol [Ketorolac Tromethamine]     "feels like I am going to crawl out of my skin"  . Trileptal [Oxcarbazepine] Other (See Comments)    Profound insomnia, worsening mood.   . Wellbutrin [Bupropion]     H/o SZ d/o.     Family History  Problem Relation Age of Onset  . Stroke Mother   . Liver disease Mother   .  Irritable bowel syndrome Mother   . Kidney disease Mother   . Heart disease Father   . Colon polyps Father   . Colon cancer Neg Hx     Social History:  reports that he has never smoked. He has never used smokeless tobacco. He reports previous alcohol use. He reports that he does not use drugs.  ROS: A complete review of systems was performed.  All systems are negative except for pertinent findings as noted.  Physical Exam:  Vital signs in last 24 hours: Temp:  [97.7 F (36.5 C)-98.7 F (37.1 C)] 97.7 F (36.5 C) (01/03 1510) Pulse Rate:  [66-98] 80 (01/03 1443) Resp:  [16-20] 18 (01/03 1443) BP: (116-210)/(78-113) 116/83 (01/03 1443) SpO2:  [91 %-100 %] 99 % (01/03 1443) Weight:  [161 kg] 161 kg (01/02 2245) Constitutional:  Alert and oriented, No acute distress Cardiovascular: Regular rate and rhythm, No JVD Respiratory: Normal respiratory effort, Lungs clear bilaterally GI: Abdomen is soft, nontender, nondistended, no abdominal masses GU: No CVA tenderness Lymphatic: No lymphadenopathy Neurologic: Grossly intact, no focal deficits Psychiatric: Normal mood and affect  Laboratory Data:  Recent Labs    01/26/20 1008 01/27/20 1258  WBC 6.6 7.3  HGB 17.4* 16.1  HCT 50.5 47.6  PLT 328 304    Recent Labs    01/26/20 1008 01/27/20 1258  NA 140 142  K 4.2 4.0  CL 106 103  GLUCOSE 117*  110*  BUN 11 16  CALCIUM 9.3 9.4  CREATININE 0.94 0.98     Results for orders placed or performed during the hospital encounter of 01/27/20 (from the past 24 hour(s))  Basic metabolic panel     Status: Abnormal   Collection Time: 01/27/20 12:58 PM  Result Value Ref Range   Sodium 142 135 - 145 mmol/L   Potassium 4.0 3.5 - 5.1 mmol/L   Chloride 103 98 - 111 mmol/L   CO2 29 22 - 32 mmol/L   Glucose, Bld 110 (H) 70 - 99 mg/dL   BUN 16 6 - 20 mg/dL   Creatinine, Ser 0.98 0.61 - 1.24 mg/dL   Calcium 9.4 8.9 - 10.3 mg/dL   GFR, Estimated >60 >60 mL/min   Anion gap 10 5 - 15  CBC  with Differential     Status: None   Collection Time: 01/27/20 12:58 PM  Result Value Ref Range   WBC 7.3 4.0 - 10.5 K/uL   RBC 5.17 4.22 - 5.81 MIL/uL   Hemoglobin 16.1 13.0 - 17.0 g/dL   HCT 47.6 39.0 - 52.0 %   MCV 92.1 80.0 - 100.0 fL   MCH 31.1 26.0 - 34.0 pg   MCHC 33.8 30.0 - 36.0 g/dL   RDW 13.9 11.5 - 15.5 %   Platelets 304 150 - 400 K/uL   nRBC 0.0 0.0 - 0.2 %   Neutrophils Relative % 48 %   Neutro Abs 3.5 1.7 - 7.7 K/uL   Lymphocytes Relative 31 %   Lymphs Abs 2.3 0.7 - 4.0 K/uL   Monocytes Relative 13 %   Monocytes Absolute 1.0 0.1 - 1.0 K/uL   Eosinophils Relative 6 %   Eosinophils Absolute 0.4 0.0 - 0.5 K/uL   Basophils Relative 1 %   Basophils Absolute 0.1 0.0 - 0.1 K/uL   Immature Granulocytes 1 %   Abs Immature Granulocytes 0.04 0.00 - 0.07 K/uL   Recent Results (from the past 240 hour(s))  Resp Panel by RT-PCR (Flu A&B, Covid) Nasopharyngeal Swab     Status: None   Collection Time: 01/27/20  5:42 AM   Specimen: Nasopharyngeal Swab; Nasopharyngeal(NP) swabs in vial transport medium  Result Value Ref Range Status   SARS Coronavirus 2 by RT PCR NEGATIVE NEGATIVE Final    Comment: (NOTE) SARS-CoV-2 target nucleic acids are NOT DETECTED.  The SARS-CoV-2 RNA is generally detectable in upper respiratory specimens during the acute phase of infection. The lowest concentration of SARS-CoV-2 viral copies this assay can detect is 138 copies/mL. A negative result does not preclude SARS-Cov-2 infection and should not be used as the sole basis for treatment or other patient management decisions. A negative result may occur with  improper specimen collection/handling, submission of specimen other than nasopharyngeal swab, presence of viral mutation(s) within the areas targeted by this assay, and inadequate number of viral copies(<138 copies/mL). A negative result must be combined with clinical observations, patient history, and epidemiological information. The expected  result is Negative.  Fact Sheet for Patients:  EntrepreneurPulse.com.au  Fact Sheet for Healthcare Providers:  IncredibleEmployment.be  This test is no t yet approved or cleared by the [place] FDA and  has been authorized for detection and/or diagnosis of SARS-CoV-2 by FDA under an Emergency Use Authorization (EUA). This EUA will remain  in effect (meaning this test can be used) for the duration of the COVID-19 declaration under Section 564(b)(1) of the Act, 21 U.S.C.section 360bbb-3(b)(1), unless the authorization is terminated  or revoked sooner.       Influenza A by PCR NEGATIVE NEGATIVE Final   Influenza B by PCR NEGATIVE NEGATIVE Final    Comment: (NOTE) The Xpert Xpress SARS-CoV-2/FLU/RSV plus assay is intended as an aid in the diagnosis of influenza from Nasopharyngeal swab specimens and should not be used as a sole basis for treatment. Nasal washings and aspirates are unacceptable for Xpert Xpress SARS-CoV-2/FLU/RSV testing.  Fact Sheet for Patients: BloggerCourse.com  Fact Sheet for Healthcare Providers: SeriousBroker.it  This test is not yet approved or cleared by the [place] FDA and has been authorized for detection and/or diagnosis of SARS-CoV-2 by FDA under an Emergency Use Authorization (EUA). This EUA will remain in effect (meaning this test can be used) for the duration of the COVID-19 declaration under Section 564(b)(1) of the Act, 21 U.S.C. section 360bbb-3(b)(1), unless the authorization is terminated or revoked.  Performed at [facility], [address], [place], [place] [address]     Renal Function: Recent Labs    01/21/20 1158 01/26/20 1008 01/27/20 1258  CREATININE 0.99 0.94 0.98   Estimated Creatinine Clearance: 155.7 mL/min (by C-G formula based on SCr of 0.98 mg/dL).  Radiologic Imaging: DG Abdomen 1 View  Result Date:  01/27/2020 CLINICAL DATA:  Nephrolithiasis EXAM: ABDOMEN - 1 VIEW COMPARISON:  01/21/2020, CT 01/08/2020 FINDINGS: The previously noted right paraspinal calculus is not as well visualized on the current examination though the region is in part obscured by dorsal column stimulator battery pack. No additional nephro or urolithiasis identified. Normal abdominal gas pattern. Multiple phleboliths within the pelvis. Dorsal column stimulator lead and cholecystectomy clips again noted. IMPRESSION: No definite nephro or urolithiasis identified though the right paraspinal region is partially obscured by dorsal column stimulator device. Electronically Signed   By: Helyn Numbers MD   On: 01/27/2020 03:34   CT Renal Stone Study  Result Date: 01/27/2020 CLINICAL DATA:  Flank pain.  Kidney stones suspected. EXAM: CT ABDOMEN AND PELVIS WITHOUT CONTRAST TECHNIQUE: Multidetector CT imaging of the abdomen and pelvis was performed following the standard protocol without IV contrast. COMPARISON:  01/08/2020 FINDINGS: Lower chest: Unremarkable. Hepatobiliary: The liver shows diffusely decreased attenuation suggesting fat deposition. Gallbladder is surgically absent. No intrahepatic or extrahepatic biliary dilation. Pancreas: No focal mass lesion. No dilatation of the main duct. No intraparenchymal cyst. No peripancreatic edema. Spleen: No splenomegaly. No focal mass lesion. Adrenals/Urinary Tract: No adrenal nodule or mass. Punctate stones are seen in the upper and lower poles of the left kidney. No left ureteral stone. No secondary changes in the left kidney or ureter. 9 x 8 x 9 mm stone is again noted at the right UPJ, similar location to prior study. Mild to moderate right hydronephrosis is similar. No other visible stones in the right kidney or ureter. No bladder stones. Stomach/Bowel: Stomach is unremarkable. No gastric wall thickening. No evidence of outlet obstruction. Duodenum is normally positioned as is the ligament of  Treitz. No small bowel wall thickening. No small bowel dilatation. The terminal ileum is normal. The appendix is not visualized, but there is no edema or inflammation in the region of the cecum. No gross colonic mass. No colonic wall thickening. Vascular/Lymphatic: No abdominal aortic aneurysm. No abdominal aortic atherosclerotic calcification. There is no gastrohepatic or hepatoduodenal ligament lymphadenopathy. No retroperitoneal or mesenteric lymphadenopathy. No pelvic sidewall lymphadenopathy. Reproductive: The prostate gland and seminal vesicles are unremarkable. Other: No intraperitoneal free fluid. Musculoskeletal: Thoracolumbar spinal stimulator device evident. No worrisome  lytic or sclerotic osseous abnormality. IMPRESSION: 1. 9 x 8 x 9 mm right UPJ stone with mild to moderate right hydronephrosis, similar to prior. 2. Punctate nonobstructing stones in the left kidney. 3. Hepatic steatosis. Electronically Signed   By: Misty Stanley M.D.   On: 01/27/2020 05:17    I independently reviewed the above imaging studies.  Assessment and Plan Casey Caldwell is a 40 y.o. male with an obstructing 9 mm right UPJ calculus associated with mild to moderate right-sided hydronephrosis  -The risks, benefits and alternatives of cystoscopy with RIGHT JJ stent placement was discussed with the patient.  Risks include, but are not limited to: bleeding, urinary tract infection, ureteral injury, ureteral stricture disease, chronic pain, urinary symptoms, bladder injury, stent migration, the need for nephrostomy tube placement, MI, CVA, DVT, PE and the inherent risks with general anesthesia.  The patient voices understanding and wishes to proceed.   Ellison Hughs, MD 01/27/2020, 3:29 PM  [facility] Pager: [phone]

## 2020-01-27 NOTE — ED Notes (Signed)
Patient transported to X-ray 

## 2020-01-27 NOTE — Discharge Instructions (Addendum)
You were seen today for ongoing pain related to kidney stone.  Call [facility] later this morning for scheduling of stent placement.  Continue medications at home.  Do not eat or drink anything until you have a scheduled plan.

## 2020-01-27 NOTE — Anesthesia Postprocedure Evaluation (Signed)
Anesthesia Post Note  Patient: SOLLIE VULTAGGIO  Procedure(s) Performed: CYSTOSCOPY WITH RETROGRADE PYELOGRAM/URETERAL STENT PLACEMENT (Right )     Patient location during evaluation: PACU Anesthesia Type: General Level of consciousness: awake and alert Pain management: pain level controlled Vital Signs Assessment: post-procedure vital signs reviewed and stable Respiratory status: spontaneous breathing, nonlabored ventilation, respiratory function stable and patient connected to nasal cannula oxygen Cardiovascular status: blood pressure returned to baseline and stable Postop Assessment: no apparent nausea or vomiting Anesthetic complications: no   No complications documented.  Last Vitals:  Vitals:   01/27/20 1700 01/27/20 1715  BP: 137/87 (!) 131/91  Pulse: 69 70  Resp: 14 12  Temp:    SpO2: 95% 93%    Last Pain:  Vitals:   01/27/20 1651  TempSrc:   PainSc: 8                  Arian Mcquitty L Ember Gottwald

## 2020-01-27 NOTE — ED Notes (Signed)
Discharge instructions discussed. Follow up care reviewed. Departs ED at this time in stable condition.

## 2020-01-27 NOTE — ED Triage Notes (Signed)
Pt presents with c/o flank pain on the right side. Pt has a known kidney stone that he has been fighting for several weeks. Pt went to [facility] yesterday and was told to schedule an appointment with [facility] today to get a stent placed. Pt reports he called today and they did not know what he was talking about. Pt is back because he is unsure what to do at this point.

## 2020-01-27 NOTE — Op Note (Signed)
Operative Note  Preoperative diagnosis:  1.  9 mm right UPJ calculus  Postoperative diagnosis: 1.  Same  Procedure(s): 1.  Cystoscopy with right ureteral stent placement 2.  Right retrograde pyelogram with intraoperative interpretation of fluoroscopic imaging  Surgeon: Rhoderick Moody, MD  Assistants:  None  Anesthesia:  General  Complications:  None  EBL: Less than 5 mL  Specimens: 1.  None  Drains/Catheters: 1.  Right 6 French, 26 cm JJ stent without tether  Intraoperative findings:   1. Right retrograde pyelogram revealed contrast jetting as well as a filling defect within the proximal aspects of the right ureter, con consistent with the obstructing stone seen on recent cross-sectional imaging.  Contrasted layer in the right renal pelvis, confirming wire placement.  There were no other filling defects seen within the distal aspects of the right ureter.  Indication:  Casey Caldwell is a 40 y.o. male with a history of kidney stones and a 2-week history of intermittent episodes of sharp right-sided flank pain.  He had CT scans done on 01/08/2020 as well as 1 on 01/27/2020 revealing an obstructing 9 mm right UPJ calculus with moderate hydronephrosis.  He has been consented for the above procedures, voices understanding wishes to proceed.  Description of procedure:  After informed consent was obtained, the patient was brought to the operating room and general LMA anesthesia was administered. The patient was then placed in the dorsolithotomy position and prepped and draped in the usual sterile fashion. A timeout was performed. A 23 French rigid cystoscope was then inserted into the urethral meatus and advanced into the bladder under direct vision. A complete bladder survey revealed no intravesical pathology.  A 5 French ureteral catheter was then inserted into the right ureteral orifice and a retrograde pyelogram was obtained, with the findings listed above.  A Glidewire was then  used to intubate the lumen of the ureteral catheter and was advanced up to the right renal pelvis, under fluoroscopic guidance.  The catheter was then removed, leaving the wire in place.  A 6 French, 26 cm JJ stent was then advanced over the wire and into good position within the right collecting system, confirming placement via fluoroscopy.  The patient's bladder was drained.  He tolerated the procedure well and was transferred to postanesthesia in stable condition.  Plan: Follow-up with Dr. Darvin Neighbours with [facility] for definitive stone treatment

## 2020-01-27 NOTE — ED Notes (Signed)
Patient transported to CT 

## 2020-01-27 NOTE — ED Provider Notes (Signed)
[facility] EMERGENCY DEPARTMENT Provider Note   CSN: [id number] Arrival date & time: 01/26/20  2225     History Chief Complaint  Patient presents with  . Flank Pain    Casey Caldwell is a 40 y.o. male.  HPI     This a 40 year old male with a history of chronic pain, kidney stones, seizures who presents with right flank and abdominal pain.  Patient reports he was diagnosed with a kidney stone approximately 3 weeks ago.  He has had difficulty following up with urology because of insurance issues.  He is waiting to hear back from his urologist, Dr. Rosana Hoes.  He states over the last several days he has had increasing pain that has become more constant.  It is mostly in the right lower quadrant.  Currently his pain is 9 out of 10.  He has been taking his prescription pain medication with no relief.  Reports nausea without vomiting.  No fevers.  He tested positive for Covid 19 in early December.  Has upper respiratory symptoms.  Patient initially presented to [facility] long hospital.  Labs reviewed including CBC and BMP.  Largely reassuring.  No significant metabolic derangement.  No leukocytosis.  Past Medical History:  Diagnosis Date  . Chronic pain syndrome   . Depression    and panic/anxiety  . GERD (gastroesophageal reflux disease)   . History of kidney stones   . Insomnia   . Kidney stones   . Seizures ([place]) Childhood    Patient Active Problem List   Diagnosis Date Noted  . COVID-19 01/05/2020  . Chronic hip pain, right 04/29/2019  . Right groin pain 04/29/2019  . Spinal cord stimulator status 02/28/2019  . Lumbar radiculopathy 02/28/2019  . Gout 01/13/2019  . Rectal pain 07/15/2018  . Plantar fasciitis 08/14/2017  . Encounter for chronic pain management 01/28/2016  . Chronic pain syndrome 10/30/2015  . Adenomatous colon polyp 06/06/2012  . Nephrolithiasis 03/14/2012  . Polycythemia 03/14/2012  . Groin pain, chronic, left 03/14/2012  . RUQ pain 03/14/2012  .  Insomnia 03/14/2012  . Seizure disorder ([place]) 03/14/2012  . Anxiety and depression 03/14/2012    Past Surgical History:  Procedure Laterality Date  . APPENDECTOMY  2011  . CHOLECYSTECTOMY, LAPAROSCOPIC    . CYSTOSCOPY/URETEROSCOPY/HOLMIUM LASER Left 06/15/2017   Procedure: CYSTOSCOPY/LEFT URETEROSCOPYSTONE EXTRACTION/LEFT RETROGRADE;  Surgeon: Franchot Gallo, MD;  Location: [facility];  Service: Urology;  Laterality: Left;  With STENT  . HYDROCELE EXCISION Left 06/2012  . LAPAROSCOPIC CHOLECYSTECTOMY  1996  . LITHOTRIPSY     four times over the years; most recent 10/08/12  . ORCHIECTOMY Left 11/2012  . SPINAL CORD STIMULATOR INSERTION  2015  . TONSILLECTOMY  1992  . WRIST GANGLION EXCISION  2002   Right       Family History  Problem Relation Age of Onset  . Stroke Mother   . Liver disease Mother   . Irritable bowel syndrome Mother   . Kidney disease Mother   . Heart disease Father   . Colon polyps Father   . Colon cancer Neg Hx     Social History   Tobacco Use  . Smoking status: Never Smoker  . Smokeless tobacco: Never Used  Vaping Use  . Vaping Use: Never used  Substance Use Topics  . Alcohol use: Not Currently    Alcohol/week: 0.0 standard drinks    Comment: rarely  . Drug use: No    Home Medications Prior to Admission medications  Medication Sig Start Date End Date Taking? Authorizing Provider  albuterol (VENTOLIN HFA) 108 (90 Base) MCG/ACT inhaler Inhale 1-2 puffs into the lungs every 6 (six) hours as needed for wheezing or shortness of breath. 12/31/19   Joy, Shawn C, PA-C  allopurinol (ZYLOPRIM) 100 MG tablet Take 1 tablet (100 mg total) by mouth daily. 04/29/19   Hilts, Casimiro Needle, MD  ARIPiprazole (ABILIFY) 5 MG tablet Take 0.5 tablets (2.5 mg total) by mouth daily. 12/02/19   Joaquim Nam, MD  benzonatate (TESSALON) 100 MG capsule Take 1 capsule (100 mg total) by mouth every 8 (eight) hours. Patient not taking: Reported on 01/21/2020 12/31/19   Anselm Pancoast,  PA-C  Colchicine 0.6 MG CAPS Take 1 tablet by mouth 2 (two) times daily as needed. Patient taking differently: Take 1 tablet by mouth 2 (two) times daily as needed (gout flare). 03/18/19   Hilts, Casimiro Needle, MD  docusate sodium (COLACE) 100 MG capsule Take 1 capsule (100 mg total) by mouth 2 (two) times daily. Patient taking differently: Take 100 mg by mouth 2 (two) times daily as needed for mild constipation. 01/27/16   Joaquim Nam, MD  escitalopram (LEXAPRO) 20 MG tablet Take 1 tablet (20 mg total) by mouth daily. 12/02/19   Joaquim Nam, MD  HYDROcodone-acetaminophen (NORCO/VICODIN) 5-325 MG tablet Take 1-2 tablets by mouth every 6 (six) hours as needed for severe pain (8 tabs or less per day). 12/26/19   Joaquim Nam, MD  HYDROmorphone (DILAUDID) 2 MG tablet Take 1 tablet (2 mg total) by mouth every 4 (four) hours as needed for severe pain (for kidney stones). 01/16/20   Joaquim Nam, MD  indapamide (LOZOL) 2.5 MG tablet Take 1 tablet (2.5 mg total) by mouth daily. 05/17/19   Joaquim Nam, MD  Melatonin 10 MG TABS Take 10 mg by mouth at bedtime.    [provider]  ondansetron (ZOFRAN ODT) 8 MG disintegrating tablet Take 1 tablet (8 mg total) by mouth every 8 (eight) hours as needed for nausea or vomiting. 01/09/20   Molpus, John, MD  pantoprazole (PROTONIX) 40 MG tablet TAKE ONE TABLET BY MOUTH TWICE A DAY Patient taking differently: Take 40 mg by mouth 2 (two) times daily. 07/23/19   Joaquim Nam, MD  polyethylene glycol powder (GLYCOLAX/MIRALAX) powder MIX 17G IN 4 TO 8 OUNCES OF FLUID AND TAKE TWICE DAILY Patient taking differently: Take 17 g by mouth 2 (two) times daily as needed for mild constipation. MIX 17G IN 4 TO 8 OUNCES OF FLUID AND TAKE TWICE DAILY 02/08/18   Joaquim Nam, MD  potassium citrate (UROCIT-K) 10 MEQ (1080 MG) SR tablet Take 1 tablet (10 mEq total) by mouth 2 (two) times daily. 05/17/19   Joaquim Nam, MD  tamsulosin (FLOMAX) 0.4 MG CAPS  capsule Take 1 capsule (0.4 mg total) by mouth daily. 01/09/20   Molpus, John, MD  zolpidem (AMBIEN) 10 MG tablet Take 1 tablet (10 mg total) by mouth at bedtime as needed. for sleep 11/19/19   Joaquim Nam, MD    Allergies    Phenergan [promethazine hcl], Tape, Toradol [ketorolac tromethamine], Trileptal [oxcarbazepine], and Wellbutrin [bupropion]  Review of Systems   Review of Systems  Constitutional: Negative for fever.  Respiratory: Negative for shortness of breath.   Cardiovascular: Negative for chest pain.  Gastrointestinal: Positive for nausea. Negative for abdominal pain and vomiting.  Genitourinary: Positive for flank pain. Negative for dysuria and hematuria.  All other systems  reviewed and are negative.   Physical Exam Updated Vital Signs BP 134/88   Pulse 81   Temp 98 F (36.7 C)   Resp 16   Ht 1.829 m (6')   Wt (!) 161 kg   SpO2 100%   BMI 48.15 kg/m   Physical Exam Vitals and nursing note reviewed.  Constitutional:      Appearance: He is well-developed and well-nourished. He is obese. He is not ill-appearing.     Comments: Uncomfortable appearing but nontoxic  HENT:     Head: Normocephalic and atraumatic.     Nose: Nose normal.     Mouth/Throat:     Mouth: Mucous membranes are moist.  Eyes:     Pupils: Pupils are equal, round, and reactive to light.  Cardiovascular:     Rate and Rhythm: Normal rate and regular rhythm.     Heart sounds: Normal heart sounds. No murmur heard.   Pulmonary:     Effort: Pulmonary effort is normal. No respiratory distress.     Breath sounds: Normal breath sounds. No wheezing.  Abdominal:     General: Bowel sounds are normal.     Palpations: Abdomen is soft.     Tenderness: There is no abdominal tenderness. There is no right CVA tenderness, left CVA tenderness or rebound.  Musculoskeletal:        General: No edema.     Cervical back: Neck supple.     Right lower leg: No edema.     Left lower leg: No edema.   Lymphadenopathy:     Cervical: No cervical adenopathy.  Skin:    General: Skin is warm and dry.  Neurological:     Mental Status: He is alert and oriented to person, place, and time.  Psychiatric:        Mood and Affect: Mood and affect and mood normal.     ED Results / Procedures / Treatments   Labs (all labs ordered are listed, but only abnormal results are displayed) Labs Reviewed  URINALYSIS, ROUTINE W REFLEX MICROSCOPIC - Abnormal; Notable for the following components:      Result Value   Hgb urine dipstick MODERATE (*)    All other components within normal limits  RESP PANEL BY RT-PCR (FLU A&B, COVID) [id number]  URINALYSIS, MICROSCOPIC (REFLEX)    EKG None  Radiology DG Abdomen 1 View  Result Date: 01/27/2020 CLINICAL DATA:  Nephrolithiasis EXAM: ABDOMEN - 1 VIEW COMPARISON:  01/21/2020, CT 01/08/2020 FINDINGS: The previously noted right paraspinal calculus is not as well visualized on the current examination though the region is in part obscured by dorsal column stimulator battery pack. No additional nephro or urolithiasis identified. Normal abdominal gas pattern. Multiple phleboliths within the pelvis. Dorsal column stimulator lead and cholecystectomy clips again noted. IMPRESSION: No definite nephro or urolithiasis identified though the right paraspinal region is partially obscured by dorsal column stimulator device. Electronically Signed   By: Fidela Salisbury MD   On: 01/27/2020 03:34   CT Renal Stone Study  Result Date: 01/27/2020 CLINICAL DATA:  Flank pain.  Kidney stones suspected. EXAM: CT ABDOMEN AND PELVIS WITHOUT CONTRAST TECHNIQUE: Multidetector CT imaging of the abdomen and pelvis was performed following the standard protocol without IV contrast. COMPARISON:  01/08/2020 FINDINGS: Lower chest: Unremarkable. Hepatobiliary: The liver shows diffusely decreased attenuation suggesting fat deposition. Gallbladder is surgically absent. No intrahepatic or extrahepatic biliary  dilation. Pancreas: No focal mass lesion. No dilatation of the main duct. No intraparenchymal cyst. No peripancreatic  edema. Spleen: No splenomegaly. No focal mass lesion. Adrenals/Urinary Tract: No adrenal nodule or mass. Punctate stones are seen in the upper and lower poles of the left kidney. No left ureteral stone. No secondary changes in the left kidney or ureter. 9 x 8 x 9 mm stone is again noted at the right UPJ, similar location to prior study. Mild to moderate right hydronephrosis is similar. No other visible stones in the right kidney or ureter. No bladder stones. Stomach/Bowel: Stomach is unremarkable. No gastric wall thickening. No evidence of outlet obstruction. Duodenum is normally positioned as is the ligament of Treitz. No small bowel wall thickening. No small bowel dilatation. The terminal ileum is normal. The appendix is not visualized, but there is no edema or inflammation in the region of the cecum. No gross colonic mass. No colonic wall thickening. Vascular/Lymphatic: No abdominal aortic aneurysm. No abdominal aortic atherosclerotic calcification. There is no gastrohepatic or hepatoduodenal ligament lymphadenopathy. No retroperitoneal or mesenteric lymphadenopathy. No pelvic sidewall lymphadenopathy. Reproductive: The prostate gland and seminal vesicles are unremarkable. Other: No intraperitoneal free fluid. Musculoskeletal: Thoracolumbar spinal stimulator device evident. No worrisome lytic or sclerotic osseous abnormality. IMPRESSION: 1. 9 x 8 x 9 mm right UPJ stone with mild to moderate right hydronephrosis, similar to prior. 2. Punctate nonobstructing stones in the left kidney. 3. Hepatic steatosis. Electronically Signed   By: Misty Stanley M.D.   On: 01/27/2020 05:17    Procedures Procedures (including critical care time)  Medications Ordered in ED Medications  pantoprazole (PROTONIX) EC tablet 40 mg (40 mg Oral Given 01/27/20 0541)  sodium chloride 0.9 % bolus 1,000 mL (0 mLs  Intravenous Stopped 01/27/20 0545)  HYDROmorphone (DILAUDID) injection 1 mg (1 mg Intravenous Given 01/27/20 0401)  ondansetron (ZOFRAN) injection 4 mg (4 mg Intravenous Given 01/27/20 0401)  HYDROmorphone (DILAUDID) injection 1 mg (1 mg Intravenous Given 01/27/20 0508)  oxyCODONE-acetaminophen (PERCOCET/ROXICET) 5-325 MG per tablet 2 tablet (2 tablets Oral Given 01/27/20 [id number])    ED Course  I have reviewed the triage vital signs and the nursing notes.  Pertinent labs & imaging results that were available during my care of the patient were reviewed by me and considered in my medical decision making (see chart for details).  Clinical Course as of 01/27/20 0631  Mon Jan 27, 2020  0546 Spoke with Dr. Milford Cage.  Wants repeat COVID testing (tested + 12/7).  Recommends pain control.  Patient to call office to set up stent placement today.   [CH]  0631 On recheck, patient remains comfortable.  He is agreeable to plan for outpatient follow-up later today for scheduling of stent placement.  He has pain medications at home. [CH]    Clinical Course User Index [CH] Kalea Perine, Barbette Hair, MD   MDM Rules/Calculators/A&P                          Patient presents with ongoing pain presumably related to kidney stone.  He is overall nontoxic.  He is uncomfortable appearing on exam.  Patient given fluids, pain and nausea medication.  I have reviewed his labs from [facility].  BMP and CBC are both reassuring without significant metabolic derangement or acute kidney injury.  Urinalysis here shows no evidence of a UTI.  Repeat KUB does not show definitive obstructing stone.  Given multiple repeat visits to the ED and potential need for definitive management, will obtain repeat CT imaging to determine stone status.  CT scan shows a  proximal stone with hydronephrosis consistent with prior imaging.  See clinical course above.  Spoke with Dr. Milford Cage.  Plan for patient to follow-up closely for scheduling of stent placement later  today.  He is agreeable to this plan and his pain control on multiple rechecks.  After history, exam, and medical workup I feel the patient has been appropriately medically screened and is safe for discharge home. Pertinent diagnoses were discussed with the patient. Patient was given return precautions.  Final Clinical Impression(s) / ED Diagnoses Final diagnoses:  Ureteral colic    Rx / DC Orders ED Discharge Orders    None       Merryl Hacker, MD 01/27/20 [phone]

## 2020-01-28 ENCOUNTER — Encounter (HOSPITAL_COMMUNITY): Payer: Self-pay | Admitting: Urology

## 2020-01-28 ENCOUNTER — Encounter: Payer: Self-pay | Admitting: Family Medicine

## 2020-01-28 NOTE — Telephone Encounter (Signed)
Patient left a voicemail stating that he had to have emergency surgery yesterday. Patient stated that he needs his pain medication refilled. Last refill 12/26/19 #240, last office visit video 01/16/20

## 2020-01-29 ENCOUNTER — Telehealth: Payer: Self-pay | Admitting: Family Medicine

## 2020-01-29 MED ORDER — HYDROCODONE-ACETAMINOPHEN 5-325 MG PO TABS: 1.0000 | ORAL_TABLET | Freq: Four times a day (QID) | ORAL | 0 refills | Status: DC | PRN

## 2020-01-29 MED ORDER — HYDROMORPHONE HCL 2 MG PO TABS: 2.0000 mg | ORAL_TABLET | ORAL | 0 refills | Status: DC | PRN

## 2020-01-29 NOTE — Addendum Note (Signed)
Addended by: Joaquim Nam on: 01/29/2020 04:17 PM   Modules accepted: Orders

## 2020-01-29 NOTE — Telephone Encounter (Signed)
Sent. Thanks.   

## 2020-01-29 NOTE — Telephone Encounter (Signed)
Patient left another voicemail stating that he really needs his pain medication refilled and would like a call back about it.

## 2020-01-29 NOTE — Telephone Encounter (Signed)
Patient is needing a refill on the following medication: Hydrocodone  And hydromorphone   Pharmacy: [facility]

## 2020-02-03 NOTE — Telephone Encounter (Signed)
See my chart message.  Agreed.  Thanks.

## 2020-02-14 ENCOUNTER — Other Ambulatory Visit: Payer: Self-pay | Admitting: Family Medicine

## 2020-02-14 MED ORDER — ONDANSETRON 8 MG PO TBDP: 8.0000 mg | ORAL_TABLET | Freq: Three times a day (TID) | ORAL | 1 refills | Status: DC | PRN

## 2020-02-14 MED ORDER — HYDROMORPHONE HCL 2 MG PO TABS: 2.0000 mg | ORAL_TABLET | ORAL | 0 refills | Status: DC | PRN

## 2020-02-17 ENCOUNTER — Telehealth: Payer: Self-pay | Admitting: Family Medicine

## 2020-02-17 NOTE — Telephone Encounter (Signed)
See my chart message

## 2020-02-18 ENCOUNTER — Other Ambulatory Visit: Payer: Self-pay | Admitting: Family Medicine

## 2020-02-19 ENCOUNTER — Other Ambulatory Visit: Payer: Self-pay | Admitting: Family Medicine

## 2020-02-19 MED ORDER — HYDROCODONE-ACETAMINOPHEN 5-325 MG PO TABS: 1.0000 | ORAL_TABLET | Freq: Four times a day (QID) | ORAL | 0 refills | Status: DC | PRN

## 2020-02-19 NOTE — Telephone Encounter (Signed)
Sent. Thanks.   

## 2020-02-19 NOTE — Telephone Encounter (Signed)
Pharmacy requests refill on: Zolpidem 10 mg   LAST REFILL: 11/19/2019 (Q-90, R-0) LAST OV: 01/02/2020 NEXT OV: Not Scheduled  [facility]Y: [facility] [place], [place]

## 2020-03-19 LAB — CBC AND DIFFERENTIAL: Hemoglobin: 17.3 (ref 13.5–17.5)

## 2020-03-19 LAB — HEPATIC FUNCTION PANEL
ALT: 82 — AB (ref 10–40)
AST: 64 — AB (ref 14–40)

## 2020-03-19 LAB — BASIC METABOLIC PANEL
Creatinine: 0.9 (ref 0.6–1.3)
Glucose: 118

## 2020-03-20 ENCOUNTER — Ambulatory Visit (INDEPENDENT_AMBULATORY_CARE_PROVIDER_SITE_OTHER): Payer: Self-pay | Admitting: Family Medicine

## 2020-03-20 ENCOUNTER — Encounter: Payer: Self-pay | Admitting: Family Medicine

## 2020-03-20 ENCOUNTER — Other Ambulatory Visit: Payer: Self-pay

## 2020-03-20 VITALS — BP 144/100 | HR 96 | Temp 97.9°F | Ht 72.0 in | Wt 372.0 lb

## 2020-03-20 DIAGNOSIS — R21 Rash and other nonspecific skin eruption: Secondary | ICD-10-CM

## 2020-03-20 DIAGNOSIS — Z23 Encounter for immunization: Secondary | ICD-10-CM

## 2020-03-20 DIAGNOSIS — I10 Essential (primary) hypertension: Secondary | ICD-10-CM

## 2020-03-20 DIAGNOSIS — G894 Chronic pain syndrome: Secondary | ICD-10-CM

## 2020-03-20 MED ORDER — LISINOPRIL 10 MG PO TABS: 5.0000 mg | ORAL_TABLET | Freq: Every day | ORAL | 1 refills | Status: DC

## 2020-03-20 MED ORDER — PREDNISONE 10 MG PO TABS: ORAL_TABLET | ORAL | 0 refills | Status: DC

## 2020-03-20 MED ORDER — HYDROCODONE-ACETAMINOPHEN 5-325 MG PO TABS: 1.0000 | ORAL_TABLET | Freq: Four times a day (QID) | ORAL | 0 refills | Status: DC | PRN

## 2020-03-20 MED ORDER — HYDROMORPHONE HCL 2 MG PO TABS: 2.0000 mg | ORAL_TABLET | ORAL | 0 refills | Status: DC | PRN

## 2020-03-20 NOTE — Patient Instructions (Addendum)
Use OTC ketoconazole cream on the groin.  Start prednisone with food.   Take 1/2 tab lisinopril then increase to 1 tab a day after 1 week if  BP is still >140/>90.   Don't change your pain meds for now.   Take care.  Glad to see you.
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
## 2020-03-22 DIAGNOSIS — I10 Essential (primary) hypertension: Secondary | ICD-10-CM | POA: Insufficient documentation

## 2020-03-22 NOTE — Assessment & Plan Note (Signed)
I refilled his pain medication with routine cautions regarding opiates.  He still having significant pain in his lower back.  His stent is out after kidney stone treatment which is an improvement, but he is still having significant baseline back pain.  His finances limit his ability to follow with the pain clinic at this point.  He is unable to work at this point due to pain.  See above.  It limits his activity/standing during the day.  I think it makes sense for him to continue his baseline pain medication, try to follow-up with the pain clinic when possible, and update me as needed.  He agrees.

## 2020-03-22 NOTE — Assessment & Plan Note (Signed)
Routine ACE inhibitor cautions discussed with patient. Take 1/2 tab lisinopril then increase to 1 tab a day after 1 week if  BP is still >140/>90.  He will update me as needed.  He agrees.

## 2020-03-22 NOTE — Assessment & Plan Note (Signed)
2 issues.  It looks like he could have a fungal infection of the tip of his penis.  Reasonable to use topical ketoconazole.  He can get that over-the-counter.  For the rash on the trunk it is itchy, he does not have a clear an obvious source.  Reasonable to take prednisone in the meantime for the itching and update me as needed.  He agrees.  No stridor.

## 2020-04-19 ENCOUNTER — Encounter: Payer: Self-pay | Admitting: Family Medicine

## 2020-04-22 ENCOUNTER — Other Ambulatory Visit: Payer: Self-pay | Admitting: Family Medicine

## 2020-04-22 MED ORDER — HYDROCODONE-ACETAMINOPHEN 5-325 MG PO TABS
1.0000 | ORAL_TABLET | Freq: Four times a day (QID) | ORAL | 0 refills | Status: DC | PRN
Start: 2020-04-22 — End: 2020-05-25

## 2020-04-22 MED ORDER — HYDROMORPHONE HCL 2 MG PO TABS: 2.0000 mg | ORAL_TABLET | ORAL | 0 refills | Status: DC | PRN

## 2020-04-22 NOTE — Progress Notes (Signed)
rx sent.  See mychart message.

## 2020-05-04 ENCOUNTER — Ambulatory Visit: Payer: Self-pay | Admitting: Family Medicine

## 2020-05-05 ENCOUNTER — Other Ambulatory Visit: Payer: Self-pay

## 2020-05-05 ENCOUNTER — Ambulatory Visit: Payer: Self-pay | Admitting: Family Medicine

## 2020-05-05 ENCOUNTER — Encounter: Payer: Self-pay | Admitting: Family Medicine

## 2020-05-05 DIAGNOSIS — G894 Chronic pain syndrome: Secondary | ICD-10-CM

## 2020-05-05 DIAGNOSIS — R21 Rash and other nonspecific skin eruption: Secondary | ICD-10-CM

## 2020-05-05 MED ORDER — COLCHICINE 0.6 MG PO CAPS: 1.0000 | ORAL_CAPSULE | Freq: Two times a day (BID) | ORAL | Status: DC | PRN

## 2020-05-05 MED ORDER — TRIAMCINOLONE ACETONIDE 0.5 % EX CREA: 1.0000 "application " | TOPICAL_CREAM | Freq: Two times a day (BID) | CUTANEOUS | 1 refills | Status: DC | PRN

## 2020-05-05 MED ORDER — LORATADINE 10 MG PO TABS: 10.0000 mg | ORAL_TABLET | Freq: Every day | ORAL | Status: DC | PRN

## 2020-05-05 MED ORDER — PREDNISONE 10 MG PO TABS
ORAL_TABLET | ORAL | 1 refills | Status: DC
Start: 2020-05-05 — End: 2020-06-12

## 2020-05-05 MED ORDER — DOCUSATE SODIUM 100 MG PO CAPS: 100.0000 mg | ORAL_CAPSULE | Freq: Two times a day (BID) | ORAL | Status: DC | PRN

## 2020-05-05 MED ORDER — LISINOPRIL 10 MG PO TABS: 10.0000 mg | ORAL_TABLET | Freq: Every day | ORAL | Status: DC

## 2020-05-05 NOTE — Progress Notes (Signed)
This visit occurred during the SARS-CoV-2 public health emergency.  Safety protocols were in place, including screening questions prior to the visit, additional usage of staff PPE, and extensive cleaning of exam room while observing appropriate contact time as indicated for disinfecting solutions.  Walking with cane, that helps with back pain and balance.  He is moving back into his apartment soon.  Back pain continues, at baseline, not better.  He is putting up with pain at baseline, but financial limitations prevent pain clinic f/u at this point.  No sedation from pain meds.  No SI/HI.  He continues to have muscle spasms in the R lower back.   He had a panic attack yesterday- he had a good weekend but then worried about the likelihood of not having a good day.  That likely led to his panic symptoms, per patient report.  He was supposed to have deposition tomorrow but that got pushed back in the meantime.  Mediation date is still in June.  He is applying for food stamps.  He is checking on disability application.    Hypertension.  Taking 10mg  lisinopril.  BP improved.  Compliant on medication  Continues to have rash on back, legs and stomach and hands.  Skin is drier at baseline.  Rash is itchy.  OTC cortisone cream helps some.    Meds, vitals, and allergies reviewed.   ROS: Per HPI unless specifically indicated in ROS section   nad ncat Neck supple, no LA rrr Ctab abd soft, not ttp Blanching rash with dry patches on the dorsum of R >L hand and fingers.  No ulceration.  He has less pronounced lesions on the abdomen.  Dermatographia some noted on the back. R lower back sig ttp with spasm noted w/o bruising R thigh paresthesia noted at baseline Able to bear weight, walking with cane.

## 2020-05-05 NOTE — Patient Instructions (Signed)
TAC cream on the hands and spots on the abdomen.  Take claritin (not claritin D) for itching on the back.   If not better, then use prednisone with food.  Update me as needed.  Take care.  Glad to see you.

## 2020-05-06 NOTE — Assessment & Plan Note (Signed)
No change in medications at this point.  He is going to try to follow-up with pain clinic when possible.  Financial limitations prevent that at this point.  He is trying to get through his deposition but that was pushed back.  We talked about his mood.  He is going to be moving back into his apartment which is a good change and he has no suicidal homicidal intent.  He contracts for safety.  Reasonable to continue with baseline pain medication for now and he will try to be as active as possible.  He agrees with plan.

## 2020-05-06 NOTE — Assessment & Plan Note (Signed)
It looks more like eczematous changes on the hands and abd, dermatographism on the back.  Discussed with patient about options. Can try TAC cream on the hands and spots on the abdomen.  Take claritin (not claritin D) for itching on the back.  If not better, then use prednisone with food.  Update me as needed.  He agrees with plan.

## 2020-05-18 ENCOUNTER — Other Ambulatory Visit: Payer: Self-pay | Admitting: Family Medicine

## 2020-05-18 ENCOUNTER — Other Ambulatory Visit: Payer: Self-pay

## 2020-05-18 NOTE — Telephone Encounter (Signed)
Last Filled 11/19/2019 #90 and 0 Last OV 05/05/2020 For rash and Chronic pain.  Next OV N/A (none scheduled) [place]

## 2020-05-18 NOTE — Telephone Encounter (Signed)
Pharmacy requests refill on: Zolpidem 10 mg   LAST REFILL: 02/19/2020 (Q-90, R-0) LAST OV: 05/05/2020 NEXT OV: Not Scheduled  PHARMACY: [facility] [address] [place], [place]

## 2020-05-19 MED ORDER — ZOLPIDEM TARTRATE 10 MG PO TABS: ORAL_TABLET | ORAL | 0 refills | Status: DC

## 2020-05-19 NOTE — Telephone Encounter (Signed)
Sent. Thanks.   

## 2020-05-25 ENCOUNTER — Other Ambulatory Visit: Payer: Self-pay | Admitting: *Deleted

## 2020-05-25 NOTE — Telephone Encounter (Signed)
Patient left a voicemail stating that he called last week about refills.  Name of Medication:Hydrocodone and Hydromorphone [facility] Last Fill or Written Date and Quantity: Hydrocodone 04/22/20 #240, Hydromorphone 04/22/20 #30 Last Office Visit and Type: 05/05/20 Next Office Visit and Type: None scheduled Last Controlled Substance Agreement Date: 08/05/19 Last UDS: 03/04/16 No record of request for these two medication  Refill request came in for another medication

## 2020-05-25 NOTE — Telephone Encounter (Signed)
To the best of my knowledge, this is the first time I have seen this request.  Erx is down.  I called the pharmacy.  The only option is to print the rxs and have him pick it up.  He can pick it up tomorrow AM.  If Erx is up tomorrow, then I can try to send rxs then, before he tries to come to clinic.    I called patient, LMOVM w/o confidential info.  I left the rxs on this message, to address early tomorrow. Thanks.

## 2020-05-26 ENCOUNTER — Encounter: Payer: Self-pay | Admitting: Family Medicine

## 2020-05-26 MED ORDER — HYDROMORPHONE HCL 2 MG PO TABS: 2.0000 mg | ORAL_TABLET | ORAL | 0 refills | Status: DC | PRN

## 2020-05-26 MED ORDER — HYDROCODONE-ACETAMINOPHEN 5-325 MG PO TABS: 1.0000 | ORAL_TABLET | Freq: Four times a day (QID) | ORAL | 0 refills | Status: DC | PRN

## 2020-05-26 NOTE — Telephone Encounter (Signed)
[place] Night - Client Nonclinical Telephone Record AccessNurse Client [place] Night - Client Client Site [facility] Physician Renford Dills - MD Contact Type Call Who Is Calling Patient / Member / Family / Caregiver Caller Name Aleksa Catterton Caller Phone Number [phone] Call Type Message Only Information Provided Reason for Call Returning a Call from the Office Initial Comment caller states they are returning a missed call from physician Disp. Time Disposition Final User 05/25/2020 5:38:11 PM General Information Provided Yes Chance, Haley Call Closed By: Cecille Aver Transaction Date/Time: 05/25/2020 5:36:52 PM (ET)

## 2020-05-26 NOTE — Telephone Encounter (Signed)
Notify pt.  I was able to send rx via erx.  He shouldn't need to pick up a hard copy.  Thanks.

## 2020-05-26 NOTE — Telephone Encounter (Signed)
Patient aware erxs were sent to the pharmacy.

## 2020-05-26 NOTE — Telephone Encounter (Signed)
LMTCB

## 2020-05-28 NOTE — Telephone Encounter (Signed)
Needs OV.  Virtual if needed but in person is preferred.  Thanks.

## 2020-05-31 ENCOUNTER — Encounter: Payer: Self-pay | Admitting: Family Medicine

## 2020-06-08 ENCOUNTER — Ambulatory Visit: Payer: Self-pay | Admitting: Family Medicine

## 2020-06-12 ENCOUNTER — Encounter: Payer: Self-pay | Admitting: Family Medicine

## 2020-06-12 ENCOUNTER — Telehealth: Payer: Self-pay | Admitting: *Deleted

## 2020-06-12 ENCOUNTER — Other Ambulatory Visit: Payer: Self-pay

## 2020-06-12 ENCOUNTER — Ambulatory Visit (INDEPENDENT_AMBULATORY_CARE_PROVIDER_SITE_OTHER): Payer: Self-pay | Admitting: Family Medicine

## 2020-06-12 VITALS — BP 152/110 | HR 96 | Temp 97.5°F | Ht 72.0 in | Wt 372.0 lb

## 2020-06-12 DIAGNOSIS — F419 Anxiety disorder, unspecified: Secondary | ICD-10-CM

## 2020-06-12 DIAGNOSIS — Z659 Problem related to unspecified psychosocial circumstances: Secondary | ICD-10-CM

## 2020-06-12 DIAGNOSIS — M549 Dorsalgia, unspecified: Secondary | ICD-10-CM

## 2020-06-12 DIAGNOSIS — F32A Depression, unspecified: Secondary | ICD-10-CM

## 2020-06-12 DIAGNOSIS — G8929 Other chronic pain: Secondary | ICD-10-CM

## 2020-06-12 MED ORDER — BUSPIRONE HCL 5 MG PO TABS: 5.0000 mg | ORAL_TABLET | Freq: Two times a day (BID) | ORAL | 1 refills | Status: DC

## 2020-06-12 MED ORDER — BUSPIRONE HCL 5 MG PO TABS: 5.0000 mg | ORAL_TABLET | Freq: Two times a day (BID) | ORAL | Status: DC

## 2020-06-12 NOTE — Progress Notes (Signed)
This visit occurred during the SARS-CoV-2 public health emergency.  Safety protocols were in place, including screening questions prior to the visit, additional usage of staff PPE, and extensive cleaning of exam room while observing appropriate contact time as indicated for disinfecting solutions.  Parking form done for patient.  He qualifies given his difficulty walking.  He is working on getting an extension about his housing.  Panic d/w pt.  He had court deposition Monday, it was a four hour deposition and that was a stressful event.  He will meet again in 3 weeks for mediation with court to follow if no resolution.  SW referral placed.  He is still out of work in the meantime.  He is trying to make arrangements for housing  He had more panic with sx inc'd with change in daily routine.  No SI/HI.  Contracts for safety.  Not sleeping well.  Still with chronic pain affecting sleep and activity.  No access to guns.  "I want to be around."  Panic sx are a significant strain on patient.  Can get heart racing, worries, anxious with events.    Financial limitation is preventing pain clinic f/u now.  Stimulator is still in place and that helps with groin pain.  The main residual issue is his R lower back which is separate from previous groin pain.  R lower back is still sig ttp, even with light touch.  Lidocaine patch didn't help with back pain and he had h/o rash with patch application prior.  He still has baseline pain medications with hydrocodone/hydromorphone.  He is not sedated on medication.  Meds, vitals, and allergies reviewed.   ROS: Per HPI unless specifically indicated in ROS section   nad ncat Affect/mood is downcast but speech is fluent and judgment is intact. Walking with cane.   ncat Neck supple no LA rrr ctab abd soft, not ttp.  R>L lower back with sig pain even with light touch.   No rash.  Midline back not tender to palpation. Strength and sensation grossly intact in lower  extremities with pain on walking.

## 2020-06-12 NOTE — Patient Instructions (Signed)
Let me check on social work options.  Try taking buspar 5mg  twice a day.  Let me know about your mood next week.  Update me as needed in the meantime.  Take care.  Glad to see you.

## 2020-06-12 NOTE — Chronic Care Management (AMB) (Signed)
  Care Management   Outreach Note  06/12/2020 Name: Casey Caldwell MRN: [id number] DOB: 20-Aug-1980  Referred by: Tonia Ghent, MD Reason for referral : Care Coordination (Initial outreach to schedule referral with Licensed clinical SW was unsuccessful )   An unsuccessful telephone outreach was attempted today. The patient was referred to the case management team for assistance with care management and care coordination.   Follow Up Plan: A HIPAA compliant phone message was left for the patient providing contact information and requesting a return call. The care management team will reach out to the patient again over the next 3 days. If patient returns call to provider office, please advise to call Rowan at [phone].  [facility]

## 2020-06-14 ENCOUNTER — Telehealth: Payer: Self-pay | Admitting: Family Medicine

## 2020-06-14 MED ORDER — HYDROMORPHONE HCL 2 MG PO TABS: 2.0000 mg | ORAL_TABLET | ORAL | 0 refills | Status: DC | PRN

## 2020-06-14 MED ORDER — HYDROCODONE-ACETAMINOPHEN 5-325 MG PO TABS: 1.0000 | ORAL_TABLET | Freq: Four times a day (QID) | ORAL | 0 refills | Status: DC | PRN

## 2020-06-14 NOTE — Telephone Encounter (Signed)
Please check on patient.  I want him to update me later this week about his mood, sooner if needed.  I am checking on psychiatry referral options.  Please see if he can check his blood pressure at home and let me know about that in the meantime.  I sent his pain medicines to be filled early next month.  Thanks.
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
## 2020-06-14 NOTE — Assessment & Plan Note (Signed)
With significant financial stressors and chronic pain.  Discussed options.  I will check on psych options for patient to follow up.  In the meantime add on BuSpar 5 mg twice a day and he will update me in the near future about his mood.  He agrees with plan.

## 2020-06-15 NOTE — Chronic Care Management (AMB) (Signed)
  Care Management   Note  06/15/2020 Name: DAMANTE SPRAGG MRN: [id number] DOB: 10/06/1980  Casey Caldwell is a 40 y.o. year old male who is a primary care patient of Tonia Ghent, MD. I reached out to [name] by phone today in response to a referral sent by Mr. PAYTEN BEAUMIER PCP, Tonia Ghent, MD.    Mr. Centrella was given information about care management services today including:  1. Care management services include personalized support from designated clinical staff supervised by his physician, including individualized plan of care and coordination with other care providers 2. 24/7 contact phone numbers for assistance for urgent and routine care needs. 3. The patient may stop care management services at any time by phone call to the office staff.  Patient agreed to services and verbal consent obtained.   Follow up plan: Telephone appointment with care management team member scheduled for:06/16/2020  [facility] Management

## 2020-06-16 ENCOUNTER — Ambulatory Visit: Payer: Self-pay | Admitting: *Deleted

## 2020-06-16 DIAGNOSIS — F32A Depression, unspecified: Secondary | ICD-10-CM

## 2020-06-16 DIAGNOSIS — F419 Anxiety disorder, unspecified: Secondary | ICD-10-CM

## 2020-06-16 NOTE — Telephone Encounter (Signed)
Patient is doing well. Advised patient we are checking on referral options for hm for psy and will get a call back about that soon hopefully. He states he does not have a BP machine at home but can always stop somewhere to get it checked if needed. States he will stop at a pharmacy tomorrow and have it checked and when he calls back later this week with update he will give me the BP numbers as well.
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
## 2020-06-17 ENCOUNTER — Telehealth: Payer: Self-pay | Admitting: Family Medicine

## 2020-06-17 NOTE — Telephone Encounter (Signed)
Noted. Thanks.  Will await update.   
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
## 2020-06-17 NOTE — Telephone Encounter (Signed)
   Telephone encounter was:  Successful.  06/17/2020 Name: Casey Caldwell MRN: [id number] DOB: 1980-01-28  Casey Caldwell is a 40 y.o. year old male who is a primary care patient of Tonia Ghent, MD . The community resource team was consulted for assistance with Financial Difficulties related to housing  Care guide performed the following interventions: Patient provided with information about care guide support team and interviewed to confirm resource needs Discussed resources to assist with housing.  Follow Up Plan:  Care guide will follow up with patient by phone over the next few days  [place], Care Management Phone: [phone] Email: [email or web address]

## 2020-06-18 ENCOUNTER — Telehealth: Payer: Self-pay | Admitting: Family Medicine

## 2020-06-18 ENCOUNTER — Telehealth: Payer: Self-pay

## 2020-06-18 NOTE — Telephone Encounter (Signed)
   Telephone encounter was:  Successful.  06/18/2020 Name: Casey Caldwell MRN: [id number] DOB: December 13, 1980  Casey Caldwell is a 40 y.o. year old male who is a primary care patient of Tonia Ghent, MD . The community resource team was consulted for assistance with Financial Difficulties related to everything  Care guide performed the following interventions: Patient provided with information about care guide support team and interviewed to confirm resource needs Discussed resources to assist with and sent an email for the following topics that he needs assistance with. Since he doesn't have insurance I sent him the application for the [facility] Card/GCCN appication, sent him a document for [employer] and resources for financial assistance, application for applying for diabilty, the link for income based housing/[place] Housing authority/Section 8 housing and the information for [employer]. Will also send patient infomation on local soup kitchen.  .  Follow Up Plan:  No further follow up planned at this time. The patient has been provided with needed resources.  [place], Care Management Phone: [phone] Email: [email or web address]

## 2020-06-18 NOTE — Progress Notes (Signed)
Thank you for reaching out. I am not familiar with this patient but am happy to review his med cost concerns. I will have my assistant reach out to see how we can help.  Thanks,  Sharyn Lull

## 2020-06-18 NOTE — Telephone Encounter (Signed)
Contacted by CCM team social worker to review patient's medication cost concerns. Will have Ria Comment reach out to review. Per SW, patient is self pay, no insurance. Per chart review, he uses [facility].   Ria Comment, please review GoodRx and other coupons. Please provide patient with available coupons if not currently using. His medications are generic so no assistance programs are available.   Debbora Dus, PharmD Clinical Pharmacist [facility] [phone]

## 2020-06-18 NOTE — Progress Notes (Signed)
    Chronic Care Management Pharmacy Assistant   Name: Casey Caldwell  MRN: [id number] DOB: 08/27/1980    Reason for Encounter: Prescription price comparison     Medications: Outpatient Encounter Medications as of 06/18/2020  Medication Sig  . allopurinol (ZYLOPRIM) 100 MG tablet Take 1 tablet (100 mg total) by mouth daily.  . ARIPiprazole (ABILIFY) 5 MG tablet Take 0.5 tablets (2.5 mg total) by mouth daily.  . busPIRone (BUSPAR) 5 MG tablet Take 1 tablet (5 mg total) by mouth 2 (two) times daily.  . Colchicine 0.6 MG CAPS Take 1 tablet by mouth 2 (two) times daily as needed (gout flare).  Marland Kitchen docusate sodium (COLACE) 100 MG capsule Take 1 capsule (100 mg total) by mouth 2 (two) times daily as needed for mild constipation.  Marland Kitchen escitalopram (LEXAPRO) 20 MG tablet Take 1 tablet (20 mg total) by mouth daily.  Marland Kitchen HYDROcodone-acetaminophen (NORCO/VICODIN) 5-325 MG tablet Take 1-2 tablets by mouth every 6 (six) hours as needed for severe pain (8 tabs or less per day).  Marland Kitchen HYDROmorphone (DILAUDID) 2 MG tablet Take 1 tablet (2 mg total) by mouth every 4 (four) hours as needed for severe pain (for kidney stones).  . indapamide (LOZOL) 2.5 MG tablet Take 1 tablet (2.5 mg total) by mouth daily.  Marland Kitchen lisinopril (ZESTRIL) 10 MG tablet Take 1 tablet (10 mg total) by mouth daily.  Marland Kitchen loratadine (CLARITIN) 10 MG tablet Take 1 tablet (10 mg total) by mouth daily as needed for allergies.  . Melatonin 10 MG TABS Take 10 mg by mouth at bedtime.  . ondansetron (ZOFRAN ODT) 8 MG disintegrating tablet Take 1 tablet (8 mg total) by mouth every 8 (eight) hours as needed for nausea or vomiting.  . pantoprazole (PROTONIX) 40 MG tablet TAKE ONE TABLET BY MOUTH TWICE A DAY  . polyethylene glycol powder (GLYCOLAX/MIRALAX) powder MIX 17G IN 4 TO 8 OUNCES OF FLUID AND TAKE TWICE DAILY  . potassium citrate (UROCIT-K) 10 MEQ (1080 MG) SR tablet Take 1 tablet (10 mEq total) by mouth 2 (two) times daily.  . tamsulosin (FLOMAX) 0.4  MG CAPS capsule Take 1 capsule (0.4 mg total) by mouth daily.  Marland Kitchen triamcinolone cream (KENALOG) 0.5 % Apply 1 application topically 2 (two) times daily as needed.  . zolpidem (AMBIEN) 10 MG tablet TAKE ONE TABLET BY MOUTH EVERY NIGHT AT BEDTIME AS NEEDED FOR SLEEP   No facility-administered encounter medications on file as of 06/18/2020.    Per patient's request ran comparison of his prescription medications. Compared [facility] to [facility] [employer] with both Good RX and Single care. Results were emailed to patient at [email or web address].   Debbora Dus, CPP notified  Margaretmary Dys, [facility] Pharmacy Assistant [phone]

## 2020-06-19 NOTE — Telephone Encounter (Signed)
Patient called back today and has been doing mostly better but still gets a beak through panic attack either once a day or every other day. He does not think anything needs to be changed right now as he knows he has had a rough week. Also patient had not been able to check BP yet. Advised patient to call back if gets worse and/or needs anything.

## 2020-06-22 NOTE — Telephone Encounter (Signed)
Noted.  Will await update from patient.  

## 2020-06-24 ENCOUNTER — Telehealth: Payer: Self-pay

## 2020-06-24 ENCOUNTER — Encounter: Payer: Self-pay | Admitting: Family Medicine

## 2020-06-25 ENCOUNTER — Encounter: Payer: Self-pay | Admitting: Family Medicine

## 2020-06-25 ENCOUNTER — Telehealth: Payer: Self-pay | Admitting: *Deleted

## 2020-06-25 MED ORDER — ONDANSETRON 8 MG PO TBDP: 8.0000 mg | ORAL_TABLET | Freq: Three times a day (TID) | ORAL | 1 refills | Status: DC | PRN

## 2020-06-25 NOTE — Telephone Encounter (Signed)
CSW was unable to reach pt at scheduled outreach call earlier this week and tried again today- HIPPA compliant voicemail message left for pt to return my call.   CSW received a message from the referral platform used for linking pt with resources for mental health counseling/support stating that they  "sent an SMS and/or email to [facility] asking them to contact us in order to discuss their referral. Marlan Palau must speak to [facility] before being able to move forward with finding the right provider for their needs. Please have them contact [facility] at [phone] (TTY/TDD: [phone]) if they are interested in being connected to care."  CSW will await pt's call back or try again-   Eduard Clos MSW, [place] Licensed Clinical Social Worker [place] [phone]

## 2020-06-26 ENCOUNTER — Telehealth: Payer: Self-pay

## 2020-06-26 NOTE — Telephone Encounter (Signed)
Used for severe pain, ie with renal stones.  Thanks.

## 2020-06-26 NOTE — Telephone Encounter (Signed)
[facility] Night - Client TELEPHONE ADVICE RECORD [facility] Patient Name: Casey Caldwell [place] Gender: Male DOB: 05-19-1980 Age: 40 Y 1 M 24 D Return Phone Number: [phone] (Primary) Address: City/ State/ Zip: [place] [address] Client [facility] Night - Client Client Site [place] Physician Renford Dills - MD Contact Type Call Who Is Calling Pharmacy Call Type Pharmacy Send to RN Chief Complaint Paging or Request for Consult Reason for Call Request to clarify medication order Initial Comment Caller states she is calling from Kristopher Oppen[facility] for clarification on medication. Pharmacy Name [facility] Pharmacist Name [place] Number [phone] Translation No Nurse Assessment Nurse: Donna Christen, RN, Legrand Como Date/Time Eilene Ghazi Time): 06/25/2020 5:37:52 PM Does the patient have any new or worsening symptoms? ---Yes Will a triage be completed? ---No Select reason for no triage. ---Other Nurse: Donna Christen, RN, Legrand Como Date/Time (Eastern Time): 06/25/2020 5:35:43 PM Please select the assessment type ---Pharmacy clarification Is there an on-call physician for the client? ---Yes Do the client directives allow paging the on call for medication concerns? ---Yes Document information that requires clarification. ---Hydromorphone 2mg  and had note for using for kidney stones. Script history has been on file for a while. Needs updated indication. Disp. Time Eilene Ghazi Time) Disposition Final User 06/25/2020 5:39:46 PM Pharmacy Call Donna Christen, RN, Legrand Como Reason: Med clarification 06/25/2020 5:39:53 PM Clinical Call Yes Donna Christen, RN, Legrand Como

## 2020-06-26 NOTE — Telephone Encounter (Signed)
Updated [facility] with indication for medication.

## 2020-07-08 ENCOUNTER — Telehealth: Payer: Self-pay

## 2020-07-08 ENCOUNTER — Telehealth: Payer: Self-pay | Admitting: *Deleted

## 2020-07-17 ENCOUNTER — Encounter: Payer: Self-pay | Admitting: Family Medicine

## 2020-07-17 ENCOUNTER — Ambulatory Visit (INDEPENDENT_AMBULATORY_CARE_PROVIDER_SITE_OTHER): Payer: Self-pay | Admitting: Family Medicine

## 2020-07-17 ENCOUNTER — Other Ambulatory Visit: Payer: Self-pay

## 2020-07-17 VITALS — BP 124/86 | HR 87 | Temp 98.0°F | Ht 72.0 in | Wt 375.0 lb

## 2020-07-17 DIAGNOSIS — R110 Nausea: Secondary | ICD-10-CM

## 2020-07-17 DIAGNOSIS — G894 Chronic pain syndrome: Secondary | ICD-10-CM

## 2020-07-17 MED ORDER — INDAPAMIDE 2.5 MG PO TABS: 2.5000 mg | ORAL_TABLET | Freq: Every day | ORAL | 1 refills | Status: DC

## 2020-07-17 MED ORDER — ONDANSETRON 4 MG PO TBDP
4.0000 mg | ORAL_TABLET | Freq: Once | ORAL | Status: AC
  Administered 2020-07-17: 4 mg via ORAL

## 2020-07-17 MED ORDER — POTASSIUM CITRATE ER 10 MEQ (1080 MG) PO TBCR: 10.0000 meq | EXTENDED_RELEASE_TABLET | Freq: Two times a day (BID) | ORAL | 1 refills | Status: DC

## 2020-07-17 MED ORDER — PANTOPRAZOLE SODIUM 40 MG PO TBEC: 1.0000 | DELAYED_RELEASE_TABLET | Freq: Two times a day (BID) | ORAL | 1 refills | Status: DC

## 2020-07-17 MED ORDER — ONDANSETRON 8 MG PO TBDP: 8.0000 mg | ORAL_TABLET | Freq: Three times a day (TID) | ORAL | 1 refills | Status: DC | PRN

## 2020-07-17 MED ORDER — GABAPENTIN 100 MG PO CAPS: 100.0000 mg | ORAL_CAPSULE | Freq: Three times a day (TID) | ORAL | 1 refills | Status: DC | PRN
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
## 2020-07-17 NOTE — Patient Instructions (Addendum)
Please call the pain clinic about getting the spinal cord stimulator addressed.   Try taking gabapentin 100mg  at night.  Then gradually increase to 3 times a day if needed for pain.  Please call about seeing behavioral health.  [phone]  Take care.  Glad to see you.

## 2020-07-18 ENCOUNTER — Other Ambulatory Visit: Payer: Self-pay

## 2020-07-18 ENCOUNTER — Emergency Department (HOSPITAL_BASED_OUTPATIENT_CLINIC_OR_DEPARTMENT_OTHER)
Admission: EM | Admit: 2020-07-18 | Discharge: 2020-07-18 | Disposition: A | Discharge status: Home / Self Care | Payer: Self-pay | Attending: Emergency Medicine | Admitting: Emergency Medicine

## 2020-07-18 ENCOUNTER — Encounter (HOSPITAL_BASED_OUTPATIENT_CLINIC_OR_DEPARTMENT_OTHER): Payer: Self-pay | Admitting: Emergency Medicine

## 2020-07-18 ENCOUNTER — Emergency Department (HOSPITAL_BASED_OUTPATIENT_CLINIC_OR_DEPARTMENT_OTHER): Payer: Self-pay

## 2020-07-18 DIAGNOSIS — N23 Unspecified renal colic: Secondary | ICD-10-CM | POA: Insufficient documentation

## 2020-07-18 DIAGNOSIS — Z79899 Other long term (current) drug therapy: Secondary | ICD-10-CM | POA: Insufficient documentation

## 2020-07-18 DIAGNOSIS — I10 Essential (primary) hypertension: Secondary | ICD-10-CM | POA: Insufficient documentation

## 2020-07-18 LAB — URINALYSIS, ROUTINE W REFLEX MICROSCOPIC
Bilirubin Urine: NEGATIVE
Glucose, UA: NEGATIVE mg/dL
Hgb urine dipstick: NEGATIVE
Ketones, ur: NEGATIVE mg/dL
Leukocytes,Ua: NEGATIVE
Nitrite: NEGATIVE
Protein, ur: NEGATIVE mg/dL
Specific Gravity, Urine: 1.025 (ref 1.005–1.030)
pH: 5 (ref 5.0–8.0)

## 2020-07-18 LAB — CBC WITH DIFFERENTIAL/PLATELET
Abs Immature Granulocytes: 0.02 10*3/uL (ref 0.00–0.07)
Basophils Absolute: 0.1 10*3/uL (ref 0.0–0.1)
Basophils Relative: 1 %
Eosinophils Absolute: 0.7 10*3/uL — ABNORMAL HIGH (ref 0.0–0.5)
Eosinophils Relative: 7 %
HCT: 50.8 % (ref 39.0–52.0)
Hemoglobin: 17.2 g/dL — ABNORMAL HIGH (ref 13.0–17.0)
Immature Granulocytes: 0 %
Lymphocytes Relative: 28 %
Lymphs Abs: 2.7 10*3/uL (ref 0.7–4.0)
MCH: 30.8 pg (ref 26.0–34.0)
MCHC: 33.9 g/dL (ref 30.0–36.0)
MCV: 90.9 fL (ref 80.0–100.0)
Monocytes Absolute: 1.1 10*3/uL — ABNORMAL HIGH (ref 0.1–1.0)
Monocytes Relative: 11 %
Neutro Abs: 5 10*3/uL (ref 1.7–7.7)
Neutrophils Relative %: 53 %
Platelets: 324 10*3/uL (ref 150–400)
RBC: 5.59 MIL/uL (ref 4.22–5.81)
RDW: 13.5 % (ref 11.5–15.5)
WBC: 9.4 10*3/uL (ref 4.0–10.5)
nRBC: 0 % (ref 0.0–0.2)

## 2020-07-18 LAB — COMPREHENSIVE METABOLIC PANEL
ALT: 75 U/L — ABNORMAL HIGH (ref 0–44)
AST: 66 U/L — ABNORMAL HIGH (ref 15–41)
Albumin: 4.2 g/dL (ref 3.5–5.0)
Alkaline Phosphatase: 68 U/L (ref 38–126)
Anion gap: 5 (ref 5–15)
BUN: 13 mg/dL (ref 6–20)
CO2: 33 mmol/L — ABNORMAL HIGH (ref 22–32)
Calcium: 9.2 mg/dL (ref 8.9–10.3)
Chloride: 102 mmol/L (ref 98–111)
Creatinine, Ser: 1.17 mg/dL (ref 0.61–1.24)
GFR, Estimated: >60 mL/min (ref >60)
Glucose, Bld: 102 mg/dL — ABNORMAL HIGH (ref 70–99)
Potassium: 4.3 mmol/L (ref 3.5–5.1)
Sodium: 140 mmol/L (ref 135–145)
Total Bilirubin: 0.7 mg/dL (ref 0.3–1.2)
Total Protein: 7.2 g/dL (ref 6.5–8.1)

## 2020-07-18 MED ORDER — HYDROMORPHONE HCL 1 MG/ML IJ SOLN
0.5000 mg | Freq: Once | INTRAMUSCULAR | Status: AC
  Administered 2020-07-18: 0.5 mg via INTRAVENOUS
  Filled 2020-07-18: qty 1

## 2020-07-18 MED ORDER — ONDANSETRON HCL 4 MG/2ML IJ SOLN
4.0000 mg | Freq: Once | INTRAMUSCULAR | Status: AC
  Administered 2020-07-18: 4 mg via INTRAVENOUS
  Filled 2020-07-18: qty 2

## 2020-07-18 MED ORDER — SODIUM CHLORIDE 0.9 % IV BOLUS
1000.0000 mL | Freq: Once | INTRAVENOUS | Status: AC
  Administered 2020-07-18: 1000 mL via INTRAVENOUS

## 2020-07-18 MED ORDER — HYDROMORPHONE HCL 1 MG/ML IJ SOLN
1.0000 mg | Freq: Once | INTRAMUSCULAR | Status: AC
  Administered 2020-07-18: 1 mg via INTRAVENOUS
  Filled 2020-07-18: qty 1

## 2020-07-18 NOTE — ED Provider Notes (Signed)
[facility] Provider Note   CSN: [id number] Arrival date & time: 07/18/20  1716     History Chief Complaint  Patient presents with   Flank Pain    [facility] is a 40 y.o. male.  Patient with history of chronic back pain, on hydrocodone, status post spinal cord stimulator, history of kidney stones --presents to the emergency department today for evaluation of right flank pain with radiation to the left groin.  Patient reports previous left-sided orchiectomy, previous appendectomy.  He states that he has had milder flank pain over the past several days but acutely worsened today.  Symptoms are reminiscent of pain from kidney stones.  No urine symptoms.  No fever.  He has felt nauseous but no vomiting.  Chronic home pain medications have not been helping.  No chest pain or shortness of breath.  The onset of this condition was acute. The course is constant. Aggravating factors: none. Alleviating factors: none.   Patient had a 9 mm right-sided ureteral stone in January 2022.  He states that this was treated with lithotripsy.  His urologist is in [place].       Past Medical History:  Diagnosis Date   Chronic pain syndrome    Depression    and panic/anxiety   GERD (gastroesophageal reflux disease)    History of kidney stones    Insomnia    Kidney stones    Seizures ([place]) Childhood    Patient Active Problem List   Diagnosis Date Noted   Hypertension 03/22/2020   Chronic back pain greater than 3 months duration 09/11/2019   Chronic hip pain, right 04/29/2019   Right groin pain 04/29/2019   Spinal cord stimulator status 02/28/2019   Lumbar radiculopathy 02/28/2019   Gout 01/13/2019   Rectal pain 07/15/2018   Plantar fasciitis 08/14/2017   Encounter for chronic pain management 01/28/2016   Chronic pain syndrome 10/30/2015   Hypercalciuria 05/27/2013   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   Rash 08/02/2012   Adenomatous colon polyp 06/06/2012    Nephrolithiasis 03/14/2012   Polycythemia 03/14/2012   Groin pain, chronic, left 03/14/2012   RUQ pain 03/14/2012   Insomnia 03/14/2012   Seizure disorder ([place]) 03/14/2012   Anxiety and depression 03/14/2012    Past Surgical History:  Procedure Laterality Date   APPENDECTOMY  2011   CHOLECYSTECTOMY, LAPAROSCOPIC     CYSTOSCOPY W/ URETERAL STENT PLACEMENT Right 01/27/2020   Procedure: CYSTOSCOPY WITH RETROGRADE PYELOGRAM/URETERAL STENT PLACEMENT;  Surgeon: Ceasar Mons, MD;  Location: [facility];  Service: Urology;  Laterality: Right;   CYSTOSCOPY/URETEROSCOPY/HOLMIUM LASER Left 06/15/2017   Procedure: CYSTOSCOPY/LEFT URETEROSCOPYSTONE EXTRACTION/LEFT RETROGRADE;  Surgeon: Franchot Gallo, MD;  Location: [facility];  Service: Urology;  Laterality: Left;  With STENT   HYDROCELE EXCISION Left 06/2012   LAPAROSCOPIC CHOLECYSTECTOMY  1996   LITHOTRIPSY     four times over the years; most recent 10/08/12   ORCHIECTOMY Left 11/2012   SPINAL CORD STIMULATOR INSERTION  2015   TONSILLECTOMY  1992   WRIST GANGLION EXCISION  2002   Right       Family History  Problem Relation Age of Onset   Stroke Mother    Liver disease Mother    Irritable bowel syndrome Mother    Kidney disease Mother    Heart disease Father    Colon polyps Father    Colon cancer Neg Hx     Social History   Tobacco Use   Smoking status: Never   Smokeless tobacco:  Never  Vaping Use   Vaping Use: Never used  Substance Use Topics   Alcohol use: Not Currently    Alcohol/week: 0.0 standard drinks    Comment: rarely   Drug use: No    Home Medications Prior to Admission medications   Medication Sig Start Date End Date Taking? Authorizing Provider  allopurinol (ZYLOPRIM) 100 MG tablet Take 1 tablet (100 mg total) by mouth daily. 04/29/19   Hilts, Legrand Como, MD  ARIPiprazole (ABILIFY) 5 MG tablet Take 0.5 tablets (2.5 mg total) by mouth daily. 12/02/19   Tonia Ghent, MD  busPIRone (BUSPAR) 5 MG tablet Take  1 tablet (5 mg total) by mouth 2 (two) times daily. 06/12/20   Tonia Ghent, MD  Colchicine 0.6 MG CAPS Take 1 tablet by mouth 2 (two) times daily as needed (gout flare). 05/05/20   Tonia Ghent, MD  docusate sodium (COLACE) 100 MG capsule Take 1 capsule (100 mg total) by mouth 2 (two) times daily as needed for mild constipation. 05/05/20   Tonia Ghent, MD  escitalopram (LEXAPRO) 20 MG tablet Take 1 tablet (20 mg total) by mouth daily. 12/02/19   Tonia Ghent, MD  gabapentin (NEURONTIN) 100 MG capsule Take 1 capsule (100 mg total) by mouth 3 (three) times daily as needed. 07/17/20   Tonia Ghent, MD  HYDROcodone-acetaminophen (NORCO/VICODIN) 5-325 MG tablet Take 1-2 tablets by mouth every 6 (six) hours as needed for severe pain (8 tabs or less per day). 06/14/20   Tonia Ghent, MD  HYDROmorphone (DILAUDID) 2 MG tablet Take 1 tablet (2 mg total) by mouth every 4 (four) hours as needed for severe pain (for kidney stones). 06/14/20   Tonia Ghent, MD  indapamide (LOZOL) 2.5 MG tablet Take 1 tablet (2.5 mg total) by mouth daily. 07/17/20   Tonia Ghent, MD  lisinopril (ZESTRIL) 10 MG tablet Take 1 tablet (10 mg total) by mouth daily. 05/05/20   Tonia Ghent, MD  loratadine (CLARITIN) 10 MG tablet Take 1 tablet (10 mg total) by mouth daily as needed for allergies. 05/05/20   Tonia Ghent, MD  Melatonin 10 MG TABS Take 10 mg by mouth at bedtime.    [provider]  ondansetron (ZOFRAN ODT) 8 MG disintegrating tablet Take 1 tablet (8 mg total) by mouth every 8 (eight) hours as needed for nausea or vomiting. 07/17/20   Tonia Ghent, MD  pantoprazole (PROTONIX) 40 MG tablet Take 1 tablet (40 mg total) by mouth 2 (two) times daily. 07/17/20   Tonia Ghent, MD  polyethylene glycol powder (GLYCOLAX/MIRALAX) powder MIX 17G IN 4 TO 8 OUNCES OF FLUID AND TAKE TWICE DAILY 02/08/18   Tonia Ghent, MD  potassium citrate (UROCIT-K) 10 MEQ (1080 MG) SR tablet Take 1 tablet  (10 mEq total) by mouth 2 (two) times daily. 07/17/20   Tonia Ghent, MD  tamsulosin (FLOMAX) 0.4 MG CAPS capsule Take 1 capsule (0.4 mg total) by mouth daily. 01/09/20   Molpus, John, MD  triamcinolone cream (KENALOG) 0.5 % Apply 1 application topically 2 (two) times daily as needed. 05/05/20   Tonia Ghent, MD  zolpidem (AMBIEN) 10 MG tablet TAKE ONE TABLET BY MOUTH EVERY NIGHT AT BEDTIME AS NEEDED FOR SLEEP 05/19/20   Tonia Ghent, MD    Allergies    Phenergan [promethazine hcl], Tape, Toradol [ketorolac tromethamine], Trileptal [oxcarbazepine], and Wellbutrin [bupropion]  Review of Systems   Review of Systems  Constitutional:  Negative for fever.  HENT:  Negative for rhinorrhea and sore throat.   Eyes:  Negative for redness.  Respiratory:  Negative for cough.   Cardiovascular:  Negative for chest pain.  Gastrointestinal:  Positive for nausea. Negative for abdominal pain, diarrhea and vomiting.  Genitourinary:  Positive for flank pain and testicular pain. Negative for dysuria, hematuria and scrotal swelling.  Musculoskeletal:  Negative for myalgias.  Skin:  Negative for rash.  Neurological:  Negative for headaches.   Physical Exam Updated Vital Signs BP (!) 147/98 (BP Location: Left Arm)   Pulse 89   Temp 98.4 F (36.9 C) (Oral)   Resp 18   Ht 6' (1.829 m)   Wt (!) 170.1 kg   SpO2 94%   BMI 50.86 kg/m   Physical Exam Vitals and nursing note reviewed.  Constitutional:      General: He is in acute distress (uncomfortable appearing).     Appearance: He is well-developed.  HENT:     Head: Normocephalic and atraumatic.  Eyes:     General:        Right eye: No discharge.        Left eye: No discharge.     Conjunctiva/sclera: Conjunctivae normal.  Cardiovascular:     Rate and Rhythm: Normal rate and regular rhythm.     Heart sounds: Normal heart sounds.  Pulmonary:     Effort: Pulmonary effort is normal.     Breath sounds: Normal breath sounds.  Abdominal:      Palpations: Abdomen is soft.     Tenderness: There is abdominal tenderness.     Comments: Patient winces with palpation over the right lower back, flank, abdomen. Pain does not seem to be localized.   Musculoskeletal:     Cervical back: Normal range of motion and neck supple.     Right lower leg: No edema.     Left lower leg: No edema.  Skin:    General: Skin is warm and dry.  Neurological:     Mental Status: He is alert.    ED Results / Procedures / Treatments   Labs (all labs ordered are listed, but only abnormal results are displayed) Labs Reviewed  CBC WITH DIFFERENTIAL/PLATELET - Abnormal; Notable for the following components:      Result Value   Hemoglobin 17.2 (*)    Monocytes Absolute 1.1 (*)    Eosinophils Absolute 0.7 (*)    All other components within normal limits  COMPREHENSIVE METABOLIC PANEL - Abnormal; Notable for the following components:   CO2 33 (*)    Glucose, Bld 102 (*)    AST 66 (*)    ALT 75 (*)    All other components within normal limits  URINALYSIS, ROUTINE W REFLEX MICROSCOPIC    EKG None  Radiology CT Renal Stone Study  Result Date: 07/18/2020 CLINICAL DATA:  Right flank pain. EXAM: CT ABDOMEN AND PELVIS WITHOUT CONTRAST TECHNIQUE: Multidetector CT imaging of the abdomen and pelvis was performed following the standard protocol without IV contrast. COMPARISON:  January 27, 2020 FINDINGS: Lower chest: No acute abnormality. Hepatobiliary: There is diffuse fatty infiltration of the liver parenchyma. No focal liver abnormality is seen. Status post cholecystectomy. No biliary dilatation. Pancreas: Unremarkable. No pancreatic ductal dilatation or surrounding inflammatory changes. Spleen: Normal in size without focal abnormality. Adrenals/Urinary Tract: Adrenal glands are unremarkable. Kidneys are normal in size, without focal lesions. Multiple 2 mm and 3 mm nonobstructing renal stones are seen within the left kidney. An additional  2 mm nonobstructing renal  stone is noted within the mid to lower right kidney. A 7 mm obstructing renal stone is noted within the proximal right ureter. Moderate severity right-sided hydronephrosis and proximal hydroureter are noted. Bladder is unremarkable. Stomach/Bowel: Stomach is within normal limits. The appendix is surgically absent. No evidence of bowel wall thickening, distention, or inflammatory changes. Vascular/Lymphatic: No significant vascular findings are present. No enlarged abdominal or pelvic lymph nodes. Reproductive: Prostate is unremarkable. Other: No abdominal wall hernia or abnormality. No abdominopelvic ascites. Musculoskeletal: A spinal stimulator device is in place. No acute or significant osseous findings. IMPRESSION: 1. 7 mm obstructing renal stone within the proximal right ureter. 2. Bilateral subcentimeter nonobstructing renal calculi. 3. Evidence of prior cholecystectomy. Electronically Signed   By: Virgina Norfolk M.D.   On: 07/18/2020 23:13    Procedures Procedures   Medications Ordered in ED Medications  HYDROmorphone (DILAUDID) injection 1 mg (has no administration in time range)  ondansetron (ZOFRAN) injection 4 mg (has no administration in time range)    ED Course  I have reviewed the triage vital signs and the nursing notes.  Pertinent labs & imaging results that were available during my care of the patient were reviewed by me and considered in my medical decision making (see chart for details).  Patient seen and examined. Work-up initiated. Medications ordered.   Vital signs reviewed and are as follows: BP (!) 147/98 (BP Location: Left Arm)   Pulse 89   Temp 98.4 F (36.9 C) (Oral)   Resp 18   Ht 6' (1.829 m)   Wt (!) 170.1 kg   SpO2 94%   BMI 50.86 kg/m   7:59 PM Patient does not report much improvement after 1.5 mg of Dilaudid.  Unfortunately he cannot tolerate Toradol.  His pulse ox has been in low 90's.   I went and rechecked him.  He winces occasionally in pain, but  appears more comfortable than earlier.  He is playing a game on an iPad.  11:31 PM patient did not have blood in the urine.  Renal protocol CT was ordered to further evaluate.  There was a delay and obtaining the results of the CT.  Patient was given additional dose of pain medication.  Pain adequately controlled at this time.  Patient updated on results.  He has a stone which is very close in size and location to the stone noted in January.  Encouraged follow-up with his urologist.  He states that he has pain medication, nausea medication, and Flomax to take at home.  Patient counseled on kidney stone treatment. Urged patient to strain urine and save any stones.   Clinical Course as of 07/18/20 2328  Sat Jul 18, 2020  2113 Creatinine: 1.17 [JS]    Clinical Course User Index [JS] Varney Biles   MDM Rules/Calculators/A&P                          Patient with flank pain consistent with ureteral colic.  CT with 7 mm stone on the right side, very similar to stone noted in January.  Creatinine is okay.  No signs of UTI or pyelonephritis.  Patient has appropriate outpatient follow-up.  Pain controlled in the emergency department.     Final Clinical Impression(s) / ED Diagnoses Final diagnoses:  Ureteral colic    Rx / DC Orders ED Discharge Orders     None        Amore Grater,  Jinny Sanders 07/18/20 2333    Little, Wenda Overland, MD 07/20/20 [id number]

## 2020-07-18 NOTE — Discharge Instructions (Signed)
Please read and follow all provided instructions.  Your diagnoses today include:  1. Ureteral colic     Tests performed today include: Urine test that did not show blood in your urine and did not show infection CT scan which showed a 7 millimeter kidney on the right side Blood test that showed normal kidney function Vital signs. See below for your results today.   Medications prescribed:  None  Take any prescribed medications only as directed.  Home care instructions:  Follow any educational materials contained in this packet.  Please double your fluid intake for the next several days. Strain your urine and save any stones that may pass.   BE VERY CAREFUL not to take multiple medicines containing Tylenol (also called acetaminophen). Doing so can lead to an overdose which can damage your liver and cause liver failure and possibly death.   Follow-up instructions: Please follow-up with your urologist or the urologist referral (provided on front page) in the next 1 week for further evaluation of your symptoms.  Return instructions:  If you need to return to the Emergency Department, go to [facility] and not [facility]. The urologists are located at [facility] and can better care for you at this location.  Please return to the Emergency Department if you experience worsening symptoms.  Please return if you develop fever or uncontrolled pain or vomiting. Please return if you have any other emergent concerns.  Additional Information:  Your vital signs today were: BP (!) 123/105   Pulse 74   Temp 98.4 F (36.9 C) (Oral)   Resp 18   Ht 6' (1.829 m)   Wt (!) 170.1 kg   SpO2 90%   BMI 50.86 kg/m  If your blood pressure (BP) was elevated above 135/85 this visit, please have this repeated by your doctor within one month. --------------

## 2020-07-18 NOTE — ED Triage Notes (Signed)
Reports right flank pain with left groin pain and urinary frequency for the last few hours.  Hx of kidney stones.  Feels the same.  Taking hydrocodone without relief.

## 2020-07-18 NOTE — ED Notes (Signed)
Josh PA-C made aware of patient's pain.
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
## 2020-07-21 ENCOUNTER — Telehealth: Payer: Self-pay | Admitting: Family Medicine

## 2020-07-21 ENCOUNTER — Emergency Department (HOSPITAL_BASED_OUTPATIENT_CLINIC_OR_DEPARTMENT_OTHER): Payer: Self-pay

## 2020-07-21 ENCOUNTER — Telehealth: Payer: Self-pay | Admitting: Student

## 2020-07-21 ENCOUNTER — Inpatient Hospital Stay (HOSPITAL_BASED_OUTPATIENT_CLINIC_OR_DEPARTMENT_OTHER)
Admission: EM | Admit: 2020-07-21 | Discharge: 2020-07-23 | DRG: 660 | Disposition: A | Discharge status: Home / Self Care | Payer: Self-pay | Attending: Internal Medicine | Admitting: Internal Medicine

## 2020-07-21 ENCOUNTER — Encounter (HOSPITAL_BASED_OUTPATIENT_CLINIC_OR_DEPARTMENT_OTHER): Payer: Self-pay | Admitting: Emergency Medicine

## 2020-07-21 ENCOUNTER — Other Ambulatory Visit: Payer: Self-pay

## 2020-07-21 DIAGNOSIS — Z8616 Personal history of COVID-19: Secondary | ICD-10-CM

## 2020-07-21 DIAGNOSIS — R112 Nausea with vomiting, unspecified: Secondary | ICD-10-CM

## 2020-07-21 DIAGNOSIS — F32A Depression, unspecified: Secondary | ICD-10-CM | POA: Diagnosis present

## 2020-07-21 DIAGNOSIS — M549 Dorsalgia, unspecified: Secondary | ICD-10-CM | POA: Diagnosis present

## 2020-07-21 DIAGNOSIS — Z885 Allergy status to narcotic agent status: Secondary | ICD-10-CM

## 2020-07-21 DIAGNOSIS — Z960 Presence of urogenital implants: Secondary | ICD-10-CM | POA: Diagnosis present

## 2020-07-21 DIAGNOSIS — Z20822 Contact with and (suspected) exposure to covid-19: Secondary | ICD-10-CM | POA: Diagnosis present

## 2020-07-21 DIAGNOSIS — Z91048 Other nonmedicinal substance allergy status: Secondary | ICD-10-CM

## 2020-07-21 DIAGNOSIS — D751 Secondary polycythemia: Secondary | ICD-10-CM | POA: Diagnosis present

## 2020-07-21 DIAGNOSIS — Z9049 Acquired absence of other specified parts of digestive tract: Secondary | ICD-10-CM

## 2020-07-21 DIAGNOSIS — Z888 Allergy status to other drugs, medicaments and biological substances status: Secondary | ICD-10-CM

## 2020-07-21 DIAGNOSIS — Z9079 Acquired absence of other genital organ(s): Secondary | ICD-10-CM

## 2020-07-21 DIAGNOSIS — I119 Hypertensive heart disease without heart failure: Secondary | ICD-10-CM | POA: Diagnosis present

## 2020-07-21 DIAGNOSIS — N132 Hydronephrosis with renal and ureteral calculous obstruction: Principal | ICD-10-CM | POA: Diagnosis present

## 2020-07-21 DIAGNOSIS — R109 Unspecified abdominal pain: Secondary | ICD-10-CM

## 2020-07-21 DIAGNOSIS — N179 Acute kidney failure, unspecified: Secondary | ICD-10-CM | POA: Diagnosis present

## 2020-07-21 DIAGNOSIS — G894 Chronic pain syndrome: Secondary | ICD-10-CM | POA: Diagnosis present

## 2020-07-21 DIAGNOSIS — G4700 Insomnia, unspecified: Secondary | ICD-10-CM | POA: Diagnosis present

## 2020-07-21 DIAGNOSIS — Z79899 Other long term (current) drug therapy: Secondary | ICD-10-CM

## 2020-07-21 DIAGNOSIS — N201 Calculus of ureter: Secondary | ICD-10-CM | POA: Diagnosis present

## 2020-07-21 DIAGNOSIS — Z9682 Presence of neurostimulator: Secondary | ICD-10-CM

## 2020-07-21 DIAGNOSIS — Z87442 Personal history of urinary calculi: Secondary | ICD-10-CM

## 2020-07-21 DIAGNOSIS — I10 Essential (primary) hypertension: Secondary | ICD-10-CM | POA: Diagnosis present

## 2020-07-21 DIAGNOSIS — Z01811 Encounter for preprocedural respiratory examination: Secondary | ICD-10-CM

## 2020-07-21 DIAGNOSIS — G8929 Other chronic pain: Secondary | ICD-10-CM | POA: Diagnosis present

## 2020-07-21 DIAGNOSIS — D72829 Elevated white blood cell count, unspecified: Secondary | ICD-10-CM | POA: Diagnosis present

## 2020-07-21 DIAGNOSIS — M109 Gout, unspecified: Secondary | ICD-10-CM | POA: Diagnosis present

## 2020-07-21 DIAGNOSIS — F419 Anxiety disorder, unspecified: Secondary | ICD-10-CM | POA: Diagnosis present

## 2020-07-21 DIAGNOSIS — G40909 Epilepsy, unspecified, not intractable, without status epilepticus: Secondary | ICD-10-CM | POA: Diagnosis present

## 2020-07-21 DIAGNOSIS — K219 Gastro-esophageal reflux disease without esophagitis: Secondary | ICD-10-CM | POA: Diagnosis present

## 2020-07-21 DIAGNOSIS — Z6841 Body Mass Index (BMI) 40.0 and over, adult: Secondary | ICD-10-CM

## 2020-07-21 LAB — COMPREHENSIVE METABOLIC PANEL
ALT: 89 U/L — ABNORMAL HIGH (ref 0–44)
AST: 77 U/L — ABNORMAL HIGH (ref 15–41)
Albumin: 4.6 g/dL (ref 3.5–5.0)
Alkaline Phosphatase: 69 U/L (ref 38–126)
Anion gap: 8 (ref 5–15)
BUN: 10 mg/dL (ref 6–20)
CO2: 30 mmol/L (ref 22–32)
Calcium: 9.5 mg/dL (ref 8.9–10.3)
Chloride: 99 mmol/L (ref 98–111)
Creatinine, Ser: 0.98 mg/dL (ref 0.61–1.24)
GFR, Estimated: >60 mL/min (ref >60)
Glucose, Bld: 108 mg/dL — ABNORMAL HIGH (ref 70–99)
Potassium: 3.8 mmol/L (ref 3.5–5.1)
Sodium: 137 mmol/L (ref 135–145)
Total Bilirubin: 1.1 mg/dL (ref 0.3–1.2)
Total Protein: 7.9 g/dL (ref 6.5–8.1)

## 2020-07-21 LAB — URINALYSIS, ROUTINE W REFLEX MICROSCOPIC
Bilirubin Urine: NEGATIVE
Glucose, UA: NEGATIVE mg/dL
Hgb urine dipstick: NEGATIVE
Ketones, ur: NEGATIVE mg/dL
Leukocytes,Ua: NEGATIVE
Nitrite: NEGATIVE
Protein, ur: NEGATIVE mg/dL
Specific Gravity, Urine: 1.015 (ref 1.005–1.030)
pH: 7 (ref 5.0–8.0)

## 2020-07-21 LAB — CBC WITH DIFFERENTIAL/PLATELET
Abs Immature Granulocytes: 0.05 10*3/uL (ref 0.00–0.07)
Basophils Absolute: 0.1 10*3/uL (ref 0.0–0.1)
Basophils Relative: 1 %
Eosinophils Absolute: 0.3 10*3/uL (ref 0.0–0.5)
Eosinophils Relative: 3 %
HCT: 53.2 % — ABNORMAL HIGH (ref 39.0–52.0)
Hemoglobin: 18.6 g/dL — ABNORMAL HIGH (ref 13.0–17.0)
Immature Granulocytes: 1 %
Lymphocytes Relative: 17 %
Lymphs Abs: 1.8 10*3/uL (ref 0.7–4.0)
MCH: 31.1 pg (ref 26.0–34.0)
MCHC: 35 g/dL (ref 30.0–36.0)
MCV: 89 fL (ref 80.0–100.0)
Monocytes Absolute: 0.8 10*3/uL (ref 0.1–1.0)
Monocytes Relative: 7 %
Neutro Abs: 7.9 10*3/uL — ABNORMAL HIGH (ref 1.7–7.7)
Neutrophils Relative %: 71 %
Platelets: 334 10*3/uL (ref 150–400)
RBC: 5.98 MIL/uL — ABNORMAL HIGH (ref 4.22–5.81)
RDW: 13.2 % (ref 11.5–15.5)
WBC: 10.9 10*3/uL — ABNORMAL HIGH (ref 4.0–10.5)
nRBC: 0 % (ref 0.0–0.2)

## 2020-07-21 LAB — LACTIC ACID, PLASMA
Lactic Acid, Venous: 1.2 mmol/L (ref 0.5–1.9)
Lactic Acid, Venous: 0.9 mmol/L (ref 0.5–1.9)

## 2020-07-21 LAB — RESP PANEL BY RT-PCR (FLU A&B, COVID) ARPGX2
Influenza A by PCR: NEGATIVE
Influenza B by PCR: NEGATIVE
SARS Coronavirus 2 by RT PCR: NEGATIVE

## 2020-07-21 LAB — LIPASE, BLOOD: Lipase: 29 U/L (ref 11–51)

## 2020-07-21 MED ORDER — HYDROMORPHONE HCL 1 MG/ML IJ SOLN
2.0000 mg | Freq: Once | INTRAMUSCULAR | Status: AC
  Administered 2020-07-21: 2 mg via INTRAVENOUS
  Filled 2020-07-21: qty 2

## 2020-07-21 MED ORDER — HYDROMORPHONE HCL 1 MG/ML IJ SOLN
2.0000 mg | Freq: Once | INTRAMUSCULAR | Status: AC
Start: 2020-07-21 — End: 2020-07-21
  Administered 2020-07-21: 2 mg via INTRAVENOUS
  Filled 2020-07-21: qty 2

## 2020-07-21 MED ORDER — ONDANSETRON HCL 4 MG/2ML IJ SOLN
4.0000 mg | Freq: Once | INTRAMUSCULAR | Status: AC
  Administered 2020-07-21: 4 mg via INTRAVENOUS
  Filled 2020-07-21: qty 2

## 2020-07-21 NOTE — ED Provider Notes (Signed)
[name] Provider Note   CSN: [id number] Arrival date & time: 07/21/20  1527     History Chief Complaint  Patient presents with   Flank Pain    Casey Caldwell is a 40 y.o. male.  The history is provided by the patient and medical records. No language interpreter was used.  Flank Pain This is a recurrent problem. The current episode started 12 to 24 hours ago. The problem occurs constantly. The problem has not changed since onset.Associated symptoms include abdominal pain (R flank). Pertinent negatives include no chest pain, no headaches and no shortness of breath. Nothing aggravates the symptoms. Nothing relieves the symptoms. He has tried nothing for the symptoms. The treatment provided no relief.      Past Medical History:  Diagnosis Date   Chronic pain syndrome    Depression    and panic/anxiety   GERD (gastroesophageal reflux disease)    History of kidney stones    Insomnia    Kidney stones    Seizures ([place]) Childhood    Patient Active Problem List   Diagnosis Date Noted   Hypertension 03/22/2020   Chronic back pain greater than 3 months duration 09/11/2019   Chronic hip pain, right 04/29/2019   Right groin pain 04/29/2019   Spinal cord stimulator status 02/28/2019   Lumbar radiculopathy 02/28/2019   Gout 01/13/2019   Rectal pain 07/15/2018   Plantar fasciitis 08/14/2017   Encounter for chronic pain management 01/28/2016   Chronic pain syndrome 10/30/2015   Hypercalciuria 05/27/2013   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   Rash 08/02/2012   Adenomatous colon polyp 06/06/2012   Nephrolithiasis 03/14/2012   Polycythemia 03/14/2012   Groin pain, chronic, left 03/14/2012   RUQ pain 03/14/2012   Insomnia 03/14/2012   Seizure disorder ([place]) 03/14/2012   Anxiety and depression 03/14/2012    Past Surgical History:  Procedure Laterality Date   APPENDECTOMY  2011   CHOLECYSTECTOMY, LAPAROSCOPIC     CYSTOSCOPY W/ URETERAL STENT PLACEMENT  Right 01/27/2020   Procedure: CYSTOSCOPY WITH RETROGRADE PYELOGRAM/URETERAL STENT PLACEMENT;  Surgeon: Ceasar Mons, MD;  Location: [facility];  Service: Urology;  Laterality: Right;   CYSTOSCOPY/URETEROSCOPY/HOLMIUM LASER Left 06/15/2017   Procedure: CYSTOSCOPY/LEFT URETEROSCOPYSTONE EXTRACTION/LEFT RETROGRADE;  Surgeon: Franchot Gallo, MD;  Location: [facility];  Service: Urology;  Laterality: Left;  With STENT   HYDROCELE EXCISION Left 06/2012   LAPAROSCOPIC CHOLECYSTECTOMY  1996   LITHOTRIPSY     four times over the years; most recent 10/08/12   ORCHIECTOMY Left 11/2012   SPINAL CORD STIMULATOR INSERTION  2015   TONSILLECTOMY  1992   WRIST GANGLION EXCISION  2002   Right       Family History  Problem Relation Age of Onset   Stroke Mother    Liver disease Mother    Irritable bowel syndrome Mother    Kidney disease Mother    Heart disease Father    Colon polyps Father    Colon cancer Neg Hx     Social History   Tobacco Use   Smoking status: Never   Smokeless tobacco: Never  Vaping Use   Vaping Use: Never used  Substance Use Topics   Alcohol use: Not Currently    Alcohol/week: 0.0 standard drinks    Comment: rarely   Drug use: No    Home Medications Prior to Admission medications   Medication Sig Start Date End Date Taking? Authorizing Provider  allopurinol (ZYLOPRIM) 100 MG tablet Take 1 tablet (100 mg total)  by mouth daily. 04/29/19   Hilts, Legrand Como, MD  ARIPiprazole (ABILIFY) 5 MG tablet Take 0.5 tablets (2.5 mg total) by mouth daily. 12/02/19   Tonia Ghent, MD  busPIRone (BUSPAR) 5 MG tablet Take 1 tablet (5 mg total) by mouth 2 (two) times daily. 06/12/20   Tonia Ghent, MD  Colchicine 0.6 MG CAPS Take 1 tablet by mouth 2 (two) times daily as needed (gout flare). 05/05/20   Tonia Ghent, MD  docusate sodium (COLACE) 100 MG capsule Take 1 capsule (100 mg total) by mouth 2 (two) times daily as needed for mild constipation. 05/05/20   Tonia Ghent,  MD  escitalopram (LEXAPRO) 20 MG tablet Take 1 tablet (20 mg total) by mouth daily. 12/02/19   Tonia Ghent, MD  gabapentin (NEURONTIN) 100 MG capsule Take 1 capsule (100 mg total) by mouth 3 (three) times daily as needed. 07/17/20   Tonia Ghent, MD  HYDROcodone-acetaminophen (NORCO/VICODIN) 5-325 MG tablet Take 1-2 tablets by mouth every 6 (six) hours as needed for severe pain (8 tabs or less per day). 06/14/20   Tonia Ghent, MD  HYDROmorphone (DILAUDID) 2 MG tablet Take 1 tablet (2 mg total) by mouth every 4 (four) hours as needed for severe pain (for kidney stones). 06/14/20   Tonia Ghent, MD  indapamide (LOZOL) 2.5 MG tablet Take 1 tablet (2.5 mg total) by mouth daily. 07/17/20   Tonia Ghent, MD  lisinopril (ZESTRIL) 10 MG tablet Take 1 tablet (10 mg total) by mouth daily. 05/05/20   Tonia Ghent, MD  loratadine (CLARITIN) 10 MG tablet Take 1 tablet (10 mg total) by mouth daily as needed for allergies. 05/05/20   Tonia Ghent, MD  Melatonin 10 MG TABS Take 10 mg by mouth at bedtime.    [provider]  ondansetron (ZOFRAN ODT) 8 MG disintegrating tablet Take 1 tablet (8 mg total) by mouth every 8 (eight) hours as needed for nausea or vomiting. 07/17/20   Tonia Ghent, MD  pantoprazole (PROTONIX) 40 MG tablet Take 1 tablet (40 mg total) by mouth 2 (two) times daily. 07/17/20   Tonia Ghent, MD  polyethylene glycol powder (GLYCOLAX/MIRALAX) powder MIX 17G IN 4 TO 8 OUNCES OF FLUID AND TAKE TWICE DAILY 02/08/18   Tonia Ghent, MD  potassium citrate (UROCIT-K) 10 MEQ (1080 MG) SR tablet Take 1 tablet (10 mEq total) by mouth 2 (two) times daily. 07/17/20   Tonia Ghent, MD  tamsulosin (FLOMAX) 0.4 MG CAPS capsule Take 1 capsule (0.4 mg total) by mouth daily. 01/09/20   Molpus, John, MD  triamcinolone cream (KENALOG) 0.5 % Apply 1 application topically 2 (two) times daily as needed. 05/05/20   Tonia Ghent, MD  zolpidem (AMBIEN) 10 MG tablet TAKE ONE  TABLET BY MOUTH EVERY NIGHT AT BEDTIME AS NEEDED FOR SLEEP 05/19/20   Tonia Ghent, MD    Allergies    Phenergan [promethazine hcl], Tape, Toradol [ketorolac tromethamine], Trileptal [oxcarbazepine], and Wellbutrin [bupropion]  Review of Systems   Review of Systems  Constitutional:  Negative for chills, diaphoresis, fatigue and fever.  HENT:  Negative for congestion.   Respiratory:  Negative for cough, chest tightness, shortness of breath and wheezing.   Cardiovascular:  Negative for chest pain.  Gastrointestinal:  Positive for abdominal pain (R flank), nausea and vomiting. Negative for constipation and diarrhea.  Genitourinary:  Positive for flank pain. Negative for dysuria and frequency.  Musculoskeletal:  Positive  for back pain. Negative for neck pain and neck stiffness.  Skin:  Negative for rash and wound.  Neurological:  Negative for light-headedness and headaches.  Psychiatric/Behavioral:  Negative for agitation and confusion.   All other systems reviewed and are negative.  Physical Exam Updated Vital Signs BP (!) 200/140   Pulse 96   Temp 98.2 F (36.8 C) (Oral)   Resp 20   Ht 6' (1.829 m)   Wt (!) 170.1 kg   SpO2 98%   BMI 50.86 kg/m   Physical Exam Constitutional:      General: He is not in acute distress.    Appearance: He is well-developed. He is not ill-appearing, toxic-appearing or diaphoretic.  HENT:     Head: Normocephalic and atraumatic.     Right Ear: External ear normal.     Left Ear: External ear normal.     Nose: Nose normal.     Mouth/Throat:     Mouth: Mucous membranes are dry.     Pharynx: No oropharyngeal exudate or posterior oropharyngeal erythema.  Eyes:     Conjunctiva/sclera: Conjunctivae normal.     Pupils: Pupils are equal, round, and reactive to light.  Cardiovascular:     Rate and Rhythm: Normal rate.     Pulses: Normal pulses.     Heart sounds: No murmur heard. Pulmonary:     Effort: Pulmonary effort is normal. No respiratory  distress.     Breath sounds: No stridor. No wheezing, rhonchi or rales.  Chest:     Chest wall: No tenderness.  Abdominal:     General: Abdomen is flat.     Palpations: Abdomen is soft.     Tenderness: There is no abdominal tenderness. There is right CVA tenderness. There is no left CVA tenderness, guarding or rebound.  Musculoskeletal:        General: Tenderness present.     Cervical back: Normal range of motion and neck supple.  Skin:    General: Skin is warm.     Coloration: Skin is not pale.     Findings: No erythema or rash.  Neurological:     General: No focal deficit present.     Mental Status: He is alert and oriented to person, place, and time.     Cranial Nerves: No cranial nerve deficit.     Motor: No abnormal muscle tone.     Coordination: Coordination normal.     Deep Tendon Reflexes: Reflexes normal.  Psychiatric:        Mood and Affect: Mood normal.    ED Results / Procedures / Treatments   Labs (all labs ordered are listed, but only abnormal results are displayed) Labs Reviewed  CBC WITH DIFFERENTIAL/PLATELET - Abnormal; Notable for the following components:      Result Value   WBC 10.9 (*)    RBC 5.98 (*)    Hemoglobin 18.6 (*)    HCT 53.2 (*)    Neutro Abs 7.9 (*)    All other components within normal limits  COMPREHENSIVE METABOLIC PANEL - Abnormal; Notable for the following components:   Glucose, Bld 108 (*)    AST 77 (*)    ALT 89 (*)    All other components within normal limits  URINE CULTURE  URINALYSIS, ROUTINE W REFLEX MICROSCOPIC  LACTIC ACID, PLASMA  LIPASE, BLOOD  LACTIC ACID, PLASMA    EKG None  Radiology US Renal  Result Date: 07/21/2020 CLINICAL DATA:  40 year old male with right ureteral calculus. Worsening  abdominal pain. EXAM: RENAL / URINARY TRACT ULTRASOUND COMPLETE COMPARISON:  CT abdomen pelvis dated 07/18/2020. FINDINGS: Evaluation is limited due to body habitus. Right Kidney: Renal measurements: 11.9 x 6.1 x 5.1 cm =  volume: 196 mL. Normal echogenicity. There is mild parenchyma atrophy. There is moderate hydronephrosis. No stone identified within the right kidney. Left Kidney: Renal measurements: 12.0 x 6.5 x 5.5 cm = volume: 225 mL. Normal echogenicity. Mild parenchyma atrophy. No hydronephrosis or shadowing stone. Bladder: The urinary bladder is mildly distended. The left ureteral jet noted. The right ureteral jet is not visualized. Other: None. IMPRESSION: Moderate right hydronephrosis with absent right ureteral jets. Findings concerning for an obstructing right ureteral calculus. Electronically Signed   By: Anner Crete M.D.   On: 07/21/2020 19:13    Procedures Procedures   Medications Ordered in ED Medications  HYDROmorphone (DILAUDID) injection 2 mg (2 mg Intravenous Given 07/21/20 1849)  ondansetron (ZOFRAN) injection 4 mg (4 mg Intravenous Given 07/21/20 1848)  HYDROmorphone (DILAUDID) injection 2 mg (2 mg Intravenous Given 07/21/20 1952)    ED Course  I have reviewed the triage vital signs and the nursing notes.  Pertinent labs & imaging results that were available during my care of the patient were reviewed by me and considered in my medical decision making (see chart for details).    MDM Rules/Calculators/A&P                          MYNOR WITKOP is a 40 y.o. male with a past medical history significant for large kidney stones requiring intervention and lithotripsy, hypertension, chronic back pain with radiculopathy and spinal cord stimulator, seizures, and polycythemia who was recently discovered to have a large right-sided kidney stone recurrence 3 days ago who presents with worsening flank pain with now nausea and vomiting.  Patient reports that he was seen 3 days ago and had a CT scan showing a 7 mm obstructing stone but his symptoms were able to get under better control and he was discharged.  He reports that he did well yesterday but this morning woke up very early in the morning and had  sudden onset of severe right back and right flank pain with nausea and vomiting this persisted all day.  Pain is uncontrollable despite home medications.  He reports his urine was smelling more foul but after discussion with family he decided to come in for reevaluation.  He is concerned he may need intervention now.  He is not tolerating p.o. and appears very uncomfortable  He otherwise denies new fevers, chills, cough, congestion or diarrhea.  He does report some constipation with his pain medicine use.  On exam, lungs are clear and chest is nontender.  Abdomen is overall nontender but his right flank is tender in his right CVA area is also tender.  Patient appears uncomfortable.  As he just had a CT scan 3 days ago, we agreed to get an ultrasound to assess for worsened hydronephrosis and get screening labs including urinalysis.  A culture was added.  The urine surprisingly does not show infection but we will do the culture.  We will get other labs.  Ultrasound returned showing persistent hydronephrosis and no ureteral jet concerning for obstruction.  Patient does have a mild leukocytosis and his creatinine is normal.  He is not septic as a normal lactic acid.  Urology was called who is discussing a plan.  Anticipate follow-up on the recommendations.  8:31  PM Spoke with urology who request she be admitted to medicine for further overall care and for he will remain n.p.o. as he will likely need urological intervention sometime in the morning tomorrow.  Patient agrees with this plan and we will call medicine for admission at [facility].  Medicine will admit.   Final Clinical Impression(s) / ED Diagnoses Final diagnoses:  Right flank pain  Ureteral stone with hydronephrosis  Intractable vomiting with nausea, unspecified vomiting type       Clinical Impression: 1. Right flank pain   2. Ureteral stone with hydronephrosis   3. Intractable vomiting with nausea, unspecified vomiting type      Disposition: Admit  This note was prepared with assistance of Dragon voice recognition software. Occasional wrong-word or sound-a-like substitutions may have occurred due to the inherent limitations of voice recognition software.     Shinita Mac, Gwenyth Allegra, MD 07/21/20 2126

## 2020-07-21 NOTE — ED Notes (Signed)
R flank pain. Recent Hx kidney stones. Bad smelling urine, Headache, Vomiting.

## 2020-07-21 NOTE — ED Notes (Signed)
Report called to Shea Stakes RN at [facility].

## 2020-07-21 NOTE — ED Triage Notes (Addendum)
Pt reports was seen for same yesterday and reports emesis and pain continued. Pt reports was told had kidney stone in right flank. Pt also reports urine color change with odor noted today.

## 2020-07-21 NOTE — Telephone Encounter (Signed)
Please check on patient about pain/relief with gabapentin, pain clinic f/u, etc.  Thanks.

## 2020-07-21 NOTE — ED Notes (Signed)
Pt desating after 2mg  dilaudid. Placed on 2L [place] O2, sats WNL

## 2020-07-21 NOTE — ED Notes (Signed)
Report given to [facility] [place].

## 2020-07-21 NOTE — Telephone Encounter (Addendum)
I returned a phone call to Dr. Sherry Ruffing of the [place] emergency department.  Regarding Casey Caldwell.  This is a 40 year old male with a history of nephrolithiasis.  He has a right upper ureteral stone for which he underwent ureteral stent placement by Dr. Lovena Neighbours on January 27, 2020.  He then followed with his local urologist in [facility] where he received shockwave lithotripsy followed by ureteral stent pull.  He re-presented to the emergency room on 07/18/2020 with recurrent flank pain.  CT scan continues to show upper ureteral stone.  Some proximal hydronephrosis.  He was discharged with as needed analgesics but over the past day has experienced increasing flank pain and vomiting.  He therefore returned to the emergency room.  This is his second visit in 3 days.  He is being treated with Dilaudid and antiemetic in the emergency room but continues to feel nauseous.  Reassuringly, he is afebrile and hemodynamically stable.  He does not have an acute kidney injury.  His urinalysis is negative for leukocyte esterase, nitrites.  His lactate is 1.2.  White blood cell count is 10.9.  I discussed with the emergency room physician that if his pain and nausea were controlled in the emergency room that he could go home with very close follow-up for stone treatment with [place].  Since his symptoms are not controlled, we discussed performing a nonurgent ureteral stent placement versus ureteroscopic stone extraction.  The emergency room plans to transfer the patient to the hospitalist service at [facility].  We recommend hydrating the patient, treating his pain, treating his nausea.  Please make the patient n.p.o. at midnight.  Recommend giving flomax and straining all urine as well. We will examine the patient in the morning and tentatively post him for right ureteral stent placement versus ureteroscopy.  Bishop Limbo, MD [facility]

## 2020-07-21 NOTE — Telephone Encounter (Signed)
LMTCB for update

## 2020-07-22 ENCOUNTER — Inpatient Hospital Stay (HOSPITAL_COMMUNITY): Payer: Self-pay | Admitting: Certified Registered"

## 2020-07-22 ENCOUNTER — Observation Stay (HOSPITAL_COMMUNITY): Payer: Self-pay

## 2020-07-22 ENCOUNTER — Encounter (HOSPITAL_COMMUNITY)
Admission: EM | Disposition: A | Discharge status: Home / Self Care | Payer: Self-pay | Source: Home / Self Care | Attending: Internal Medicine

## 2020-07-22 ENCOUNTER — Encounter (HOSPITAL_COMMUNITY): Payer: Self-pay | Admitting: Internal Medicine

## 2020-07-22 ENCOUNTER — Inpatient Hospital Stay (HOSPITAL_COMMUNITY): Payer: Self-pay

## 2020-07-22 DIAGNOSIS — Z01811 Encounter for preprocedural respiratory examination: Secondary | ICD-10-CM

## 2020-07-22 DIAGNOSIS — I10 Essential (primary) hypertension: Secondary | ICD-10-CM

## 2020-07-22 DIAGNOSIS — N132 Hydronephrosis with renal and ureteral calculous obstruction: Secondary | ICD-10-CM | POA: Diagnosis present

## 2020-07-22 DIAGNOSIS — N201 Calculus of ureter: Secondary | ICD-10-CM

## 2020-07-22 DIAGNOSIS — G8929 Other chronic pain: Secondary | ICD-10-CM

## 2020-07-22 DIAGNOSIS — R112 Nausea with vomiting, unspecified: Secondary | ICD-10-CM

## 2020-07-22 DIAGNOSIS — M549 Dorsalgia, unspecified: Secondary | ICD-10-CM

## 2020-07-22 DIAGNOSIS — N179 Acute kidney failure, unspecified: Secondary | ICD-10-CM | POA: Diagnosis present

## 2020-07-22 HISTORY — PX: URETEROSCOPY WITH HOLMIUM LASER LITHOTRIPSY: SHX6645

## 2020-07-22 LAB — BASIC METABOLIC PANEL
Anion gap: 7 (ref 5–15)
BUN: 11 mg/dL (ref 6–20)
CO2: 33 mmol/L — ABNORMAL HIGH (ref 22–32)
Calcium: 9.6 mg/dL (ref 8.9–10.3)
Chloride: 101 mmol/L (ref 98–111)
Creatinine, Ser: 0.87 mg/dL (ref 0.61–1.24)
GFR, Estimated: >60 mL/min (ref >60)
Glucose, Bld: 125 mg/dL — ABNORMAL HIGH (ref 70–99)
Potassium: 4.8 mmol/L (ref 3.5–5.1)
Sodium: 141 mmol/L (ref 135–145)

## 2020-07-22 LAB — HIV ANTIBODY (ROUTINE TESTING W REFLEX): HIV Screen 4th Generation wRfx: NONREACTIVE

## 2020-07-22 LAB — CBC
HCT: 53.5 % — ABNORMAL HIGH (ref 39.0–52.0)
Hemoglobin: 18 g/dL — ABNORMAL HIGH (ref 13.0–17.0)
MCH: 30.7 pg (ref 26.0–34.0)
MCHC: 33.6 g/dL (ref 30.0–36.0)
MCV: 91.3 fL (ref 80.0–100.0)
Platelets: 287 10*3/uL (ref 150–400)
RBC: 5.86 MIL/uL — ABNORMAL HIGH (ref 4.22–5.81)
RDW: 13.5 % (ref 11.5–15.5)
WBC: 10.2 10*3/uL (ref 4.0–10.5)
nRBC: 0 % (ref 0.0–0.2)

## 2020-07-22 SURGERY — URETEROSCOPY, WITH LITHOTRIPSY USING HOLMIUM LASER
Anesthesia: General | Laterality: Right

## 2020-07-22 MED ORDER — MIDAZOLAM HCL 5 MG/5ML IJ SOLN
INTRAMUSCULAR | Status: DC | PRN
  Administered 2020-07-22: 2 mg via INTRAVENOUS

## 2020-07-22 MED ORDER — PHENYLEPHRINE 40 MCG/ML (10ML) SYRINGE FOR IV PUSH (FOR BLOOD PRESSURE SUPPORT)
PREFILLED_SYRINGE | INTRAVENOUS | Status: DC | PRN
  Administered 2020-07-22: 120 ug via INTRAVENOUS

## 2020-07-22 MED ORDER — SODIUM CHLORIDE 0.9 % IV SOLN
12.5000 mg | Freq: Four times a day (QID) | INTRAVENOUS | Status: DC | PRN
  Administered 2020-07-22: 12.5 mg via INTRAVENOUS
  Filled 2020-07-22: qty 12.5
  Filled 2020-07-22: qty 0.5

## 2020-07-22 MED ORDER — PROPOFOL 10 MG/ML IV BOLUS
INTRAVENOUS | Status: DC | PRN
  Administered 2020-07-22: 200 mg via INTRAVENOUS

## 2020-07-22 MED ORDER — TAMSULOSIN HCL 0.4 MG PO CAPS
0.4000 mg | ORAL_CAPSULE | Freq: Every day | ORAL | Status: DC
  Administered 2020-07-23: 0.4 mg via ORAL
  Filled 2020-07-22 (×2): qty 1

## 2020-07-22 MED ORDER — ONDANSETRON HCL 4 MG PO TABS: 4.0000 mg | ORAL_TABLET | Freq: Four times a day (QID) | ORAL | Status: DC | PRN

## 2020-07-22 MED ORDER — ACETAMINOPHEN 325 MG PO TABS: 650.0000 mg | ORAL_TABLET | Freq: Four times a day (QID) | ORAL | Status: DC | PRN

## 2020-07-22 MED ORDER — CHLORHEXIDINE GLUCONATE 0.12 % MT SOLN
15.0000 mL | OROMUCOSAL | Status: AC
  Administered 2020-07-22: 15 mL via OROMUCOSAL

## 2020-07-22 MED ORDER — ONDANSETRON HCL 4 MG/2ML IJ SOLN
INTRAMUSCULAR | Status: DC | PRN
  Administered 2020-07-22: 4 mg via INTRAVENOUS

## 2020-07-22 MED ORDER — CEFAZOLIN SODIUM-DEXTROSE 2-4 GM/100ML-% IV SOLN
INTRAVENOUS | Status: AC
  Filled 2020-07-22: qty 100

## 2020-07-22 MED ORDER — IOHEXOL 300 MG/ML  SOLN
INTRAMUSCULAR | Status: DC | PRN
  Administered 2020-07-22: 10 mL via URETHRAL

## 2020-07-22 MED ORDER — FENTANYL CITRATE (PF) 100 MCG/2ML IJ SOLN: 25.0000 ug | INTRAMUSCULAR | Status: DC | PRN

## 2020-07-22 MED ORDER — CEFAZOLIN IN SODIUM CHLORIDE 3-0.9 GM/100ML-% IV SOLN
3.0000 g | Freq: Once | INTRAVENOUS | Status: AC
  Administered 2020-07-22: 3 g via INTRAVENOUS
  Filled 2020-07-22 (×2): qty 100

## 2020-07-22 MED ORDER — ONDANSETRON HCL 4 MG/2ML IJ SOLN
4.0000 mg | Freq: Four times a day (QID) | INTRAMUSCULAR | Status: DC | PRN
  Administered 2020-07-22 – 2020-07-23 (×3): 4 mg via INTRAVENOUS
  Filled 2020-07-22 (×4): qty 2

## 2020-07-22 MED ORDER — DEXAMETHASONE SODIUM PHOSPHATE 10 MG/ML IJ SOLN
INTRAMUSCULAR | Status: DC | PRN
  Administered 2020-07-22 (×2): 5 mg via INTRAVENOUS

## 2020-07-22 MED ORDER — MIDAZOLAM HCL 2 MG/2ML IJ SOLN
INTRAMUSCULAR | Status: AC
  Filled 2020-07-22: qty 2

## 2020-07-22 MED ORDER — LABETALOL HCL 5 MG/ML IV SOLN
INTRAVENOUS | Status: AC
  Filled 2020-07-22: qty 4

## 2020-07-22 MED ORDER — ARIPIPRAZOLE 5 MG PO TABS
2.5000 mg | ORAL_TABLET | Freq: Every day | ORAL | Status: DC
  Administered 2020-07-23: 2.5 mg via ORAL
  Filled 2020-07-22 (×2): qty 1

## 2020-07-22 MED ORDER — LACTATED RINGERS IV SOLN: INTRAVENOUS | Status: DC

## 2020-07-22 MED ORDER — ESCITALOPRAM OXALATE 20 MG PO TABS
20.0000 mg | ORAL_TABLET | Freq: Every day | ORAL | Status: DC
  Administered 2020-07-23: 20 mg via ORAL
  Filled 2020-07-22: qty 1

## 2020-07-22 MED ORDER — PANTOPRAZOLE SODIUM 40 MG PO TBEC
40.0000 mg | DELAYED_RELEASE_TABLET | Freq: Two times a day (BID) | ORAL | Status: DC
  Administered 2020-07-22 – 2020-07-23 (×2): 40 mg via ORAL
  Filled 2020-07-22 (×2): qty 1

## 2020-07-22 MED ORDER — ACETAMINOPHEN 500 MG PO TABS
1000.0000 mg | ORAL_TABLET | Freq: Once | ORAL | Status: AC
  Administered 2020-07-22: 1000 mg via ORAL
  Filled 2020-07-22: qty 2

## 2020-07-22 MED ORDER — LIDOCAINE 2% (20 MG/ML) 5 ML SYRINGE
INTRAMUSCULAR | Status: DC | PRN
  Administered 2020-07-22: 100 mg via INTRAVENOUS

## 2020-07-22 MED ORDER — POLYETHYLENE GLYCOL 3350 17 G PO PACK
17.0000 g | PACK | Freq: Two times a day (BID) | ORAL | Status: DC
  Administered 2020-07-23: 17 g via ORAL
  Filled 2020-07-22: qty 1

## 2020-07-22 MED ORDER — AMISULPRIDE (ANTIEMETIC) 5 MG/2ML IV SOLN
10.0000 mg | Freq: Once | INTRAVENOUS | Status: DC | PRN
Start: 2020-07-22 — End: 2020-07-22

## 2020-07-22 MED ORDER — SODIUM CHLORIDE 0.9 % IR SOLN
Status: DC | PRN
  Administered 2020-07-22 (×2): 3000 mL via INTRAVESICAL

## 2020-07-22 MED ORDER — PROPOFOL 10 MG/ML IV BOLUS
INTRAVENOUS | Status: AC
  Filled 2020-07-22: qty 40

## 2020-07-22 MED ORDER — PROMETHAZINE HCL 25 MG PO TABS: 12.5000 mg | ORAL_TABLET | Freq: Four times a day (QID) | ORAL | Status: DC | PRN

## 2020-07-22 MED ORDER — MELATONIN 5 MG PO TABS
10.0000 mg | ORAL_TABLET | Freq: Every day | ORAL | Status: DC
  Administered 2020-07-22: 10 mg via ORAL
  Filled 2020-07-22: qty 2

## 2020-07-22 MED ORDER — HYDROMORPHONE HCL 1 MG/ML IJ SOLN
2.0000 mg | INTRAMUSCULAR | Status: DC | PRN
  Administered 2020-07-22 – 2020-07-23 (×6): 2 mg via INTRAVENOUS
  Filled 2020-07-22 (×6): qty 2

## 2020-07-22 MED ORDER — LABETALOL HCL 5 MG/ML IV SOLN
10.0000 mg | Freq: Once | INTRAVENOUS | Status: AC
  Administered 2020-07-22: 10 mg via INTRAVENOUS

## 2020-07-22 MED ORDER — BUSPIRONE HCL 5 MG PO TABS
5.0000 mg | ORAL_TABLET | Freq: Two times a day (BID) | ORAL | Status: DC
  Administered 2020-07-22 – 2020-07-23 (×2): 5 mg via ORAL
  Filled 2020-07-22 (×2): qty 1

## 2020-07-22 MED ORDER — PROMETHAZINE HCL 25 MG RE SUPP: 12.5000 mg | Freq: Four times a day (QID) | RECTAL | Status: DC | PRN

## 2020-07-22 MED ORDER — LISINOPRIL 10 MG PO TABS
10.0000 mg | ORAL_TABLET | Freq: Every day | ORAL | Status: DC
  Administered 2020-07-23: 10 mg via ORAL
  Filled 2020-07-22: qty 1

## 2020-07-22 MED ORDER — HYDROMORPHONE HCL 1 MG/ML IJ SOLN
0.5000 mg | INTRAMUSCULAR | Status: DC | PRN
  Administered 2020-07-22 (×4): 1 mg via INTRAVENOUS
  Filled 2020-07-22 (×4): qty 1

## 2020-07-22 MED ORDER — COLCHICINE 0.6 MG PO TABS: 0.6000 mg | ORAL_TABLET | Freq: Two times a day (BID) | ORAL | Status: DC | PRN

## 2020-07-22 MED ORDER — ACETAMINOPHEN 650 MG RE SUPP: 650.0000 mg | Freq: Four times a day (QID) | RECTAL | Status: DC | PRN

## 2020-07-22 SURGICAL SUPPLY — 21 items
BAG URO CATCHER STRL LF (MISCELLANEOUS) ×2 IMPLANT
BASKET LASER NITINOL 1.9FR (BASKET) ×2 IMPLANT
CATH INTERMIT  6FR 70CM (CATHETERS) ×2 IMPLANT
CLOTH BEACON ORANGE TIMEOUT ST (SAFETY) ×2 IMPLANT
EXTRACTOR STONE 1.7FRX115CM (UROLOGICAL SUPPLIES) IMPLANT
GLOVE SURG ENC TEXT LTX SZ7.5 (GLOVE) ×2 IMPLANT
GOWN STRL REUS W/TWL LRG LVL3 (GOWN DISPOSABLE) ×2 IMPLANT
GUIDEWIRE ANG ZIPWIRE 038X150 (WIRE) ×2 IMPLANT
GUIDEWIRE STR DUAL SENSOR (WIRE) ×2 IMPLANT
KIT TURNOVER KIT A (KITS) ×2 IMPLANT
LASER FIB FLEXIVA PULSE ID 365 (Laser) IMPLANT
MANIFOLD NEPTUNE II (INSTRUMENTS) ×2 IMPLANT
PACK CYSTO (CUSTOM PROCEDURE TRAY) ×2 IMPLANT
SHEATH URETERAL 12FRX28CM (UROLOGICAL SUPPLIES) IMPLANT
SHEATH URETERAL 12FRX35CM (MISCELLANEOUS) ×2 IMPLANT
STENT POLARIS 5FRX26 (STENTS) ×2 IMPLANT
TRACTIP FLEXIVA PULS ID 200XHI (Laser) IMPLANT
TRACTIP FLEXIVA PULSE ID 200 (Laser) ×2 IMPLANT
TUBE FEEDING 8FR 16IN STR KANG (MISCELLANEOUS) ×2 IMPLANT
TUBING CONNECTING 10 (TUBING) ×2 IMPLANT
TUBING UROLOGY SET (TUBING) ×2 IMPLANT

## 2020-07-22 NOTE — Anesthesia Procedure Notes (Signed)
Procedure Name: LMA Insertion Date/Time: 07/22/2020 4:38 PM Performed by: Rosaland Lao, CRNA Pre-anesthesia Checklist: Patient identified, Emergency Drugs available, Suction available and Patient being monitored Patient Re-evaluated:Patient Re-evaluated prior to induction Oxygen Delivery Method: Circle system utilized Preoxygenation: Pre-oxygenation with 100% oxygen Induction Type: IV induction LMA: LMA with gastric port inserted LMA Size: 5.0 Number of attempts: 1 Placement Confirmation: positive ETCO2 and breath sounds checked- equal and bilateral Tube secured with: Tape Dental Injury: Teeth and Oropharynx as per pre-operative assessment

## 2020-07-22 NOTE — H&P (Signed)
History and Physical    Casey Caldwell:[id number] DOB: 26-Jun-1980 DOA: 07/21/2020  PCP: Tonia Ghent, MD  Patient coming from: Home  I have personally briefly reviewed patient's old medical records in [facility]  Chief Complaint: Flank pain  HPI: Casey Caldwell is a 40 y.o. male with medical history significant of kidney stones, CPS on norco chronically, GERD, HTN.  Pt presents to the ED at [facility] with ongoing severe R flank pain.  H of ureteral stone in Jan 2022, had shock wave lithotripsy and stenting done that time.  Presented to ED 3 days ago with onset of severe R flank pain.  Feels like prior stone.  Symptoms constant, severe, nothing makes better or worse.  Confirmed to have 85mm obstructing R ureteral stone.  Started on Flomax.  No fevers, chills,  Has had vomiting.  Returns to ED for ongoing pain.  ED Course: WBC 10.9k, no other SIRS.  Has persistent pain.  Transferred to [facility].  Urology planning intervention for stone tomorrow most likely per urology resident note.   Review of Systems: As per HPI, otherwise all review of systems negative.  Past Medical History:  Diagnosis Date   Chronic pain syndrome    Depression    and panic/anxiety   GERD (gastroesophageal reflux disease)    History of kidney stones    Insomnia    Kidney stones    Seizures ([place]) Childhood    Past Surgical History:  Procedure Laterality Date   APPENDECTOMY  2011   CHOLECYSTECTOMY, LAPAROSCOPIC     CYSTOSCOPY W/ URETERAL STENT PLACEMENT Right 01/27/2020   Procedure: CYSTOSCOPY WITH RETROGRADE PYELOGRAM/URETERAL STENT PLACEMENT;  Surgeon: Ceasar Mons, MD;  Location: [facility];  Service: Urology;  Laterality: Right;   CYSTOSCOPY/URETEROSCOPY/HOLMIUM LASER Left 06/15/2017   Procedure: CYSTOSCOPY/LEFT URETEROSCOPYSTONE EXTRACTION/LEFT RETROGRADE;  Surgeon: Franchot Gallo, MD;  Location: [facility];  Service: Urology;  Laterality: Left;  With STENT   HYDROCELE EXCISION Left  06/2012   LAPAROSCOPIC CHOLECYSTECTOMY  1996   LITHOTRIPSY     four times over the years; most recent 10/08/12   ORCHIECTOMY Left 11/2012   SPINAL CORD STIMULATOR INSERTION  2015   TONSILLECTOMY  1992   WRIST GANGLION EXCISION  2002   Right     reports that he has never smoked. He has never used smokeless tobacco. He reports previous alcohol use. He reports that he does not use drugs.  Allergies  Allergen Reactions   Phenergan [Promethazine Hcl]    Tape Hives    Adhesive tape   Toradol [Ketorolac Tromethamine]     "feels like I am going to crawl out of my skin"   Trileptal [Oxcarbazepine] Other (See Comments)    Profound insomnia, worsening mood.    Wellbutrin [Bupropion]     H/o SZ d/o.     Family History  Problem Relation Age of Onset   Stroke Mother    Liver disease Mother    Irritable bowel syndrome Mother    Kidney disease Mother    Heart disease Father    Colon polyps Father    Colon cancer Neg Hx      Prior to Admission medications   Medication Sig Start Date End Date Taking? Authorizing Provider  allopurinol (ZYLOPRIM) 100 MG tablet Take 1 tablet (100 mg total) by mouth daily. 04/29/19   Hilts, Legrand Como, MD  ARIPiprazole (ABILIFY) 5 MG tablet Take 0.5 tablets (2.5 mg total) by mouth daily. 12/02/19   Tonia Ghent, MD  busPIRone (BUSPAR) 5 MG tablet Take 1 tablet (5 mg total) by mouth 2 (two) times daily. 06/12/20   Tonia Ghent, MD  Colchicine 0.6 MG CAPS Take 1 tablet by mouth 2 (two) times daily as needed (gout flare). 05/05/20   Tonia Ghent, MD  docusate sodium (COLACE) 100 MG capsule Take 1 capsule (100 mg total) by mouth 2 (two) times daily as needed for mild constipation. 05/05/20   Tonia Ghent, MD  escitalopram (LEXAPRO) 20 MG tablet Take 1 tablet (20 mg total) by mouth daily. 12/02/19   Tonia Ghent, MD  gabapentin (NEURONTIN) 100 MG capsule Take 1 capsule (100 mg total) by mouth 3 (three) times daily as needed. 07/17/20   Tonia Ghent, MD   HYDROcodone-acetaminophen (NORCO/VICODIN) 5-325 MG tablet Take 1-2 tablets by mouth every 6 (six) hours as needed for severe pain (8 tabs or less per day). 06/14/20   Tonia Ghent, MD  HYDROmorphone (DILAUDID) 2 MG tablet Take 1 tablet (2 mg total) by mouth every 4 (four) hours as needed for severe pain (for kidney stones). 06/14/20   Tonia Ghent, MD  indapamide (LOZOL) 2.5 MG tablet Take 1 tablet (2.5 mg total) by mouth daily. 07/17/20   Tonia Ghent, MD  lisinopril (ZESTRIL) 10 MG tablet Take 1 tablet (10 mg total) by mouth daily. 05/05/20   Tonia Ghent, MD  loratadine (CLARITIN) 10 MG tablet Take 1 tablet (10 mg total) by mouth daily as needed for allergies. 05/05/20   Tonia Ghent, MD  Melatonin 10 MG TABS Take 10 mg by mouth at bedtime.    [provider]  ondansetron (ZOFRAN ODT) 8 MG disintegrating tablet Take 1 tablet (8 mg total) by mouth every 8 (eight) hours as needed for nausea or vomiting. 07/17/20   Tonia Ghent, MD  pantoprazole (PROTONIX) 40 MG tablet Take 1 tablet (40 mg total) by mouth 2 (two) times daily. 07/17/20   Tonia Ghent, MD  polyethylene glycol powder (GLYCOLAX/MIRALAX) powder MIX 17G IN 4 TO 8 OUNCES OF FLUID AND TAKE TWICE DAILY 02/08/18   Tonia Ghent, MD  potassium citrate (UROCIT-K) 10 MEQ (1080 MG) SR tablet Take 1 tablet (10 mEq total) by mouth 2 (two) times daily. 07/17/20   Tonia Ghent, MD  tamsulosin (FLOMAX) 0.4 MG CAPS capsule Take 1 capsule (0.4 mg total) by mouth daily. 01/09/20   Molpus, John, MD  triamcinolone cream (KENALOG) 0.5 % Apply 1 application topically 2 (two) times daily as needed. 05/05/20   Tonia Ghent, MD  zolpidem (AMBIEN) 10 MG tablet TAKE ONE TABLET BY MOUTH EVERY NIGHT AT BEDTIME AS NEEDED FOR SLEEP 05/19/20   Tonia Ghent, MD    Physical Exam: Vitals:   07/21/20 2100 07/21/20 2237 07/21/20 2350 07/21/20 2353  BP: (!) 172/98 133/87  (!) 157/94  Pulse: 80 80  80  Resp: 18 16  20   Temp:    98.2 F (36.8 C) 98.3 F (36.8 C)  TempSrc:  Oral Oral Oral  SpO2: 95% 93%  96%  Weight:   (!) 168.4 kg   Height:   6' (1.829 m)     Constitutional: NAD, calm, comfortable Eyes: PERRL, lids and conjunctivae normal ENMT: Mucous membranes are moist. Posterior pharynx clear of any exudate or lesions.Normal dentition.  Neck: normal, supple, no masses, no thyromegaly Respiratory: clear to auscultation bilaterally, no wheezing, no crackles. Normal respiratory effort. No accessory muscle use.  Cardiovascular: Regular rate  and rhythm, no murmurs / rubs / gallops. No extremity edema. 2+ pedal pulses. No carotid bruits.  Abdomen: no tenderness, no masses palpated. No hepatosplenomegaly. Bowel sounds positive.  Musculoskeletal: no clubbing / cyanosis. No joint deformity upper and lower extremities. Good ROM, no contractures. Normal muscle tone.  Skin: no rashes, lesions, ulcers. No induration Neurologic: CN 2-12 grossly intact. Sensation intact, DTR normal. Strength 5/5 in all 4.  Psychiatric: Normal judgment and insight. Alert and oriented x 3. Normal mood.    Labs on Admission: I have personally reviewed following labs and imaging studies  CBC: Recent Labs  Lab 07/18/20 1821 07/21/20 1836  WBC 9.4 10.9*  NEUTROABS 5.0 7.9*  HGB 17.2* 18.6*  HCT 50.8 53.2*  MCV 90.9 89.0  PLT 324 426   Basic Metabolic Panel: Recent Labs  Lab 07/18/20 1821 07/21/20 1836  NA 140 137  K 4.3 3.8  CL 102 99  CO2 33* 30  GLUCOSE 102* 108*  BUN 13 10  CREATININE 1.17 0.98  CALCIUM 9.2 9.5   GFR: Estimated Creatinine Clearance: 158.2 mL/min (by C-G formula based on SCr of 0.98 mg/dL). Liver Function Tests: Recent Labs  Lab 07/18/20 1821 07/21/20 1836  AST 66* 77*  ALT 75* 89*  ALKPHOS 68 69  BILITOT 0.7 1.1  PROT 7.2 7.9  ALBUMIN 4.2 4.6   Recent Labs  Lab 07/21/20 1836  LIPASE 29   No results for input(s): AMMONIA in the last 168 hours. Coagulation Profile: No results for  input(s): INR, PROTIME in the last 168 hours. Cardiac Enzymes: No results for input(s): CKTOTAL, CKMB, CKMBINDEX, TROPONINI in the last 168 hours. BNP (last 3 results) No results for input(s): PROBNP in the last 8760 hours. HbA1C: No results for input(s): HGBA1C in the last 72 hours. CBG: No results for input(s): GLUCAP in the last 168 hours. Lipid Profile: No results for input(s): CHOL, HDL, LDLCALC, TRIG, CHOLHDL, LDLDIRECT in the last 72 hours. Thyroid Function Tests: No results for input(s): TSH, T4TOTAL, FREET4, T3FREE, THYROIDAB in the last 72 hours. Anemia Panel: No results for input(s): VITAMINB12, FOLATE, FERRITIN, TIBC, IRON, RETICCTPCT in the last 72 hours. Urine analysis:    Component Value Date/Time   COLORURINE YELLOW 07/21/2020 1620   APPEARANCEUR CLEAR 07/21/2020 1620   LABSPEC 1.015 07/21/2020 1620   PHURINE 7.0 07/21/2020 1620   GLUCOSEU NEGATIVE 07/21/2020 1620   HGBUR NEGATIVE 07/21/2020 1620   BILIRUBINUR NEGATIVE 07/21/2020 1620   [place] 07/21/2020 1620   PROTEINUR NEGATIVE 07/21/2020 1620   UROBILINOGEN 1.0 10/20/2014 1820   NITRITE NEGATIVE 07/21/2020 1620   LEUKOCYTESUR NEGATIVE 07/21/2020 1620    Radiological Exams on Admission: US Renal  Result Date: 07/21/2020 CLINICAL DATA:  40 year old male with right ureteral calculus. Worsening abdominal pain. EXAM: RENAL / URINARY TRACT ULTRASOUND COMPLETE COMPARISON:  CT abdomen pelvis dated 07/18/2020. FINDINGS: Evaluation is limited due to body habitus. Right Kidney: Renal measurements: 11.9 x 6.1 x 5.1 cm = volume: 196 mL. Normal echogenicity. There is mild parenchyma atrophy. There is moderate hydronephrosis. No stone identified within the right kidney. Left Kidney: Renal measurements: 12.0 x 6.5 x 5.5 cm = volume: 225 mL. Normal echogenicity. Mild parenchyma atrophy. No hydronephrosis or shadowing stone. Bladder: The urinary bladder is mildly distended. The left ureteral jet noted. The right  ureteral jet is not visualized. Other: None. IMPRESSION: Moderate right hydronephrosis with absent right ureteral jets. Findings concerning for an obstructing right ureteral calculus. Electronically Signed   By: Laren Everts.D.  On: 07/21/2020 19:13    EKG: Independently reviewed.  Assessment/Plan Principal Problem:   Right ureteral stone Active Problems:   Hypertension   Chronic back pain greater than 3 months duration    R ureteral stone - Urology planning on OR tomorrow it sounds like NPO after MN IVF: LR at 175 Dilaudid PRN pain Zofran PRN nausea Continue flomax Getting CXR and EKG for pre-op eval HTN - Cont flomax Med rec pending  DVT prophylaxis: SCDs Code Status: Full Family Communication: No family in room Disposition Plan: Home after cleared by urology Consults called: Urology Admission status: Place in 39   Shacola Schussler, [place] Hospitalists  How to contact the [facility] Attending or Consulting provider [place] or covering provider during after hours 7P -7A, for this patient?  Check the care team in [facility] and look for a) attending/consulting [facility] provider listed and b) the [facility] team listed Log into [email or web address]  Amion Physician Scheduling and messaging for groups and whole hospitals  On call and physician scheduling software for group practices, residents, hospitalists and other medical providers for call, clinic, rotation and shift schedules. OnCall [employer] is a hospital-wide system for scheduling doctors and paging doctors on call. EasyPlot is for scientific plotting and data analysis.  [email or web address]  and use [place]'s universal password to access. If you do not have the password, please contact the hospital operator.  Locate the [facility] provider you are looking for under [facility] and page to a number that you can be directly reached. If you still have difficulty reaching the provider, please page the [facility] (Director on Call) for the Hospitalists  listed on amion for assistance.  07/22/2020, 12:36 AM

## 2020-07-22 NOTE — Telephone Encounter (Signed)
Noted. Thanks.

## 2020-07-22 NOTE — Transfer of Care (Signed)
Immediate Anesthesia Transfer of Care Note  Patient: Casey Caldwell  Procedure(s) Performed: CYSTOSCOPY, RIGHT RETROGRADE PYLEOGRAM, RIGHT URETEROSCOPY WITH HOLMIUM LASER LITHOTRIPSY, RIGHT URETERAL STENT (Right)  Patient Location: PACU  Anesthesia Type:General  Level of Consciousness: awake, alert  and oriented  Airway & Oxygen Therapy: Patient Spontanous Breathing and Patient connected to face mask  Post-op Assessment: Report given to RN and Post -op Vital signs reviewed and stable  Post vital signs: Reviewed and stable  Last Vitals:  Vitals Value Taken Time  BP 209/124 07/22/20 1754  Temp    Pulse 92 07/22/20 1755  Resp 9 07/22/20 1755  SpO2 94 % 07/22/20 1755  Vitals shown include unvalidated device data.  Last Pain:  Vitals:   07/22/20 1523  TempSrc: Oral  PainSc: 6       Patients Stated Pain Goal: 2 (38/18/29 9371)  Complications: No notable events documented.

## 2020-07-22 NOTE — Anesthesia Preprocedure Evaluation (Addendum)
Anesthesia Evaluation  Patient identified by MRN, date of birth, ID band Patient awake    Reviewed: Allergy & Precautions, NPO status , Patient's Chart, lab work & pertinent test results  History of Anesthesia Complications Negative for: history of anesthetic complications  Airway Mallampati: III  TM Distance: >3 FB Neck ROM: Full    Dental no notable dental hx.    Pulmonary neg pulmonary ROS,    Pulmonary exam normal        Cardiovascular hypertension, Pt. on medications Normal cardiovascular exam     Neuro/Psych Seizures - (remote history), Well Controlled,  PSYCHIATRIC DISORDERS Anxiety Depression    GI/Hepatic Neg liver ROS, GERD  Controlled and Medicated,  Endo/Other  Morbid obesity (BMI 48)  Renal/GU   negative genitourinary   Musculoskeletal   Abdominal   Peds  Hematology negative hematology ROS (+)   Anesthesia Other Findings Chronic pain s/p spinal cord stimulator  Reproductive/Obstetrics                            Anesthesia Physical  Anesthesia Plan  ASA: 3  Anesthesia Plan: General   Post-op Pain Management:    Induction: Intravenous  PONV Risk Score and Plan: 3 and Ondansetron, Dexamethasone and Midazolam  Airway Management Planned: LMA  Additional Equipment:   Intra-op Plan:   Post-operative Plan: Extubation in OR  Informed Consent: I have reviewed the patients History and Physical, chart, labs and discussed the procedure including the risks, benefits and alternatives for the proposed anesthesia with the patient or authorized representative who has indicated his/her understanding and acceptance.     Dental advisory given  Plan Discussed with: Anesthesiologist and CRNA  Anesthesia Plan Comments:        Anesthesia Quick Evaluation

## 2020-07-22 NOTE — Progress Notes (Signed)
Unable to administer patient's PO medications due to patient nausea/vomiting. PRNs have been given for nausea/vomiting.  Will cont to monitor.

## 2020-07-22 NOTE — Brief Op Note (Signed)
07/22/2020  5:43 PM  PATIENT:  Casey Caldwell  40 y.o. male  PRE-OPERATIVE DIAGNOSIS:  Ureterolithiasis  POST-OPERATIVE DIAGNOSIS:  Ureterolithiasis  PROCEDURE:  Procedure(s): CYSTOSCOPY, RIGHT RETROGRADE PYLEOGRAM, RIGHT URETEROSCOPY WITH HOLMIUM LASER LITHOTRIPSY, RIGHT URETERAL STENT (Right)  SURGEON:  Surgeon(s) and Role:    * Alexis Frock, MD - Primary  PHYSICIAN ASSISTANT:   ASSISTANTS: none   ANESTHESIA:   general  EBL:  minimal   BLOOD ADMINISTERED:none  DRAINS: none   LOCAL MEDICATIONS USED:  NONE  SPECIMEN:  Source of Specimen:  right ureteral / renal stone fragments  DISPOSITION OF SPECIMEN:   [facility] for compositional analysis  COUNTS:  YES  TOURNIQUET:  * No tourniquets in log *  DICTATION: .Other Dictation: Dictation Number [id number]  PLAN OF CARE: Admit to inpatient   PATIENT DISPOSITION:  PACU - hemodynamically stable.   Delay start of Pharmacological VTE agent (>24hrs) due to surgical blood loss or risk of bleeding: yes

## 2020-07-22 NOTE — Progress Notes (Signed)
  Patient received direct admit from [facility].    07/21/20 2353  Vitals  Temp 98.3 F (36.8 C)  Temp Source Oral  BP (!) 157/94  MAP (mmHg) 112  BP Location Right Arm  BP Method Automatic  Patient Position (if appropriate) Lying  Pulse Rate 80  Pulse Rate Source Dinamap  Resp 20  MEWS COLOR  MEWS Score Color Green  Oxygen Therapy  SpO2 96 %  O2 Device Room Air

## 2020-07-22 NOTE — Anesthesia Postprocedure Evaluation (Signed)
Anesthesia Post Note  Patient: KANYON SEIBOLD  Procedure(s) Performed: CYSTOSCOPY, RIGHT RETROGRADE PYLEOGRAM, RIGHT URETEROSCOPY WITH HOLMIUM LASER LITHOTRIPSY, RIGHT URETERAL STENT (Right)     Patient location during evaluation: PACU Anesthesia Type: General Level of consciousness: awake and alert Pain management: pain level controlled Vital Signs Assessment: post-procedure vital signs reviewed and stable Respiratory status: spontaneous breathing, nonlabored ventilation and respiratory function stable Cardiovascular status: blood pressure returned to baseline and stable Postop Assessment: no apparent nausea or vomiting Anesthetic complications: no   No notable events documented.  Last Vitals:  Vitals:   07/22/20 1753 07/22/20 1757  BP: (!) 186/128 (!) 170/121  Pulse: 93 90  Resp: 20 19  Temp: 36.8 C   SpO2: 93% 93%    Last Pain:  Vitals:   07/22/20 1753  TempSrc:   PainSc: Asleep                 Lidia Collum

## 2020-07-22 NOTE — Progress Notes (Signed)
[facility] PROGRESS NOTE    Progress Note  Casey Caldwell  GLO:[id number] DOB: 14-Dec-1980 DOA: 07/21/2020 PCP: Tonia Ghent, MD     Brief Narrative:   Casey Caldwell is an 40 y.o. male past medical history of nephrolithiasis and has undergone stent placement by Dr. Gilford Rile in January 27, 2020, during this time he had shock lithotripsy and stent placement essential hypertension presented to [place] with severe right flank pain, CT scan confirmed 7 mm obstructive right urethral stone started on Flomax urology has been consulted     Assessment/Plan:   Right ureteral stone Urology has been consulted and is planning for OR tomorrow same day ureteral stent placement and lithotripsy.  Placed n.p.o. Continue IV fluids and Dilaudid for pain Use Phenergan for nausea  Essential hypertension: Resume home antihypertensive medication. Blood pressure is high.  DVT prophylaxis: lovenox Family Communication:none Status is: Observation  The patient remains OBS appropriate and will d/c before 2 midnights.  Dispo: The patient is from: Home              Anticipated d/c is to: Home              Patient currently is not medically stable to d/c.   Difficult to place patient No        Code Status:     Code Status Orders  (From admission, onward)           Start     Ordered   07/22/20 0027  Full code  Continuous        07/22/20 0028           Code Status History     This patient has a current code status but no historical code status.         IV Access:   Peripheral IV   Procedures and diagnostic studies:   US Renal  Result Date: 07/21/2020 CLINICAL DATA:  40 year old male with right ureteral calculus. Worsening abdominal pain. EXAM: RENAL / URINARY TRACT ULTRASOUND COMPLETE COMPARISON:  CT abdomen pelvis dated 07/18/2020. FINDINGS: Evaluation is limited due to body habitus. Right Kidney: Renal measurements: 11.9 x 6.1 x 5.1 cm = volume: 196  mL. Normal echogenicity. There is mild parenchyma atrophy. There is moderate hydronephrosis. No stone identified within the right kidney. Left Kidney: Renal measurements: 12.0 x 6.5 x 5.5 cm = volume: 225 mL. Normal echogenicity. Mild parenchyma atrophy. No hydronephrosis or shadowing stone. Bladder: The urinary bladder is mildly distended. The left ureteral jet noted. The right ureteral jet is not visualized. Other: None. IMPRESSION: Moderate right hydronephrosis with absent right ureteral jets. Findings concerning for an obstructing right ureteral calculus. Electronically Signed   By: Anner Crete M.D.   On: 07/21/2020 19:13   DG CHEST PORT 1 VIEW  Result Date: 07/22/2020 CLINICAL DATA:  Preop ureteral stone EXAM: PORTABLE CHEST 1 VIEW COMPARISON:  12/31/2019 FINDINGS: Cardiomegaly. Low lung volumes. No confluent opacities, effusions or edema. No acute bony abnormality. IMPRESSION: Cardiomegaly.  No active disease. Electronically Signed   By: Rolm Baptise M.D.   On: 07/22/2020 00:56     Medical Consultants:   None.   Subjective:    Casey Caldwell relates he still in pain.  Objective:    Vitals:   07/21/20 2353 07/22/20 0547 07/22/20 0553 07/22/20 1013  BP: (!) 157/94 (!) 139/106  (!) 153/93  Pulse: 80 77 88 66  Resp: 20 20  (!) 22  Temp: 98.3  F (36.8 C) 98 F (36.7 C)  97.8 F (36.6 C)  TempSrc: Oral Oral    SpO2: 96% 96% 96% 97%  Weight:      Height:       SpO2: 97 % O2 Flow Rate (L/min): 2 L/min   Intake/Output Summary (Last 24 hours) at 07/22/2020 1113 Last data filed at 07/22/2020 0556 Gross per 24 hour  Intake 872.48 ml  Output 300 ml  Net 572.48 ml   Filed Weights   07/21/20 1545 07/21/20 2350  Weight: (!) 170.1 kg (!) 168.4 kg    Exam: General exam: In no acute distress. Respiratory system: Good air movement and clear to auscultation. Cardiovascular system: S1 & S2 heard, RRR. No JVD. Gastrointestinal system: Abdomen is nondistended, soft and  nontender.  Extremities: No pedal edema. Skin: No rashes, lesions or ulcers Psychiatry: Judgement and insight appear normal. Mood & affect appropriate.    Data Reviewed:    Labs: Basic Metabolic Panel: Recent Labs  Lab 07/18/20 1821 07/21/20 1836 07/22/20 0457  NA 140 137 141  K 4.3 3.8 4.8  CL 102 99 101  CO2 33* 30 33*  GLUCOSE 102* 108* 125*  BUN 13 10 11   CREATININE 1.17 0.98 0.87  CALCIUM 9.2 9.5 9.6   GFR Estimated Creatinine Clearance: 178.2 mL/min (by C-G formula based on SCr of 0.87 mg/dL). Liver Function Tests: Recent Labs  Lab 07/18/20 1821 07/21/20 1836  AST 66* 77*  ALT 75* 89*  ALKPHOS 68 69  BILITOT 0.7 1.1  PROT 7.2 7.9  ALBUMIN 4.2 4.6   Recent Labs  Lab 07/21/20 1836  LIPASE 29   No results for input(s): AMMONIA in the last 168 hours. Coagulation profile No results for input(s): INR, PROTIME in the last 168 hours. COVID-19 Labs  No results for input(s): DDIMER, FERRITIN, LDH, CRP in the last 72 hours.  Lab Results  Component Value Date   SARSCOV2NAA NEGATIVE 07/21/2020   SARSCOV2NAA NEGATIVE 01/27/2020   SARSCOV2NAA POSITIVE (A) 12/31/2019   SARSCOV2NAA NOT DETECTED 10/26/2018    CBC: Recent Labs  Lab 07/18/20 1821 07/21/20 1836 07/22/20 0457  WBC 9.4 10.9* 10.2  NEUTROABS 5.0 7.9*  --   HGB 17.2* 18.6* 18.0*  HCT 50.8 53.2* 53.5*  MCV 90.9 89.0 91.3  PLT 324 334 287   Cardiac Enzymes: No results for input(s): CKTOTAL, CKMB, CKMBINDEX, TROPONINI in the last 168 hours. BNP (last 3 results) No results for input(s): PROBNP in the last 8760 hours. CBG: No results for input(s): GLUCAP in the last 168 hours. D-Dimer: No results for input(s): DDIMER in the last 72 hours. Hgb A1c: No results for input(s): HGBA1C in the last 72 hours. Lipid Profile: No results for input(s): CHOL, HDL, LDLCALC, TRIG, CHOLHDL, LDLDIRECT in the last 72 hours. Thyroid function studies: No results for input(s): TSH, T4TOTAL, T3FREE, THYROIDAB in  the last 72 hours.  Invalid input(s): FREET3 Anemia work up: No results for input(s): VITAMINB12, FOLATE, FERRITIN, TIBC, IRON, RETICCTPCT in the last 72 hours. Sepsis Labs: Recent Labs  Lab 07/18/20 1821 07/21/20 1836 07/21/20 2033 07/22/20 0457  WBC 9.4 10.9*  --  10.2  LATICACIDVEN  --  1.2 0.9  --    Microbiology Recent Results (from the past 240 hour(s))  Resp Panel by RT-PCR (Flu A&B, Covid) Nasopharyngeal Swab     Status: None   Collection Time: 07/21/20  9:23 PM   Specimen: Nasopharyngeal Swab; Nasopharyngeal(NP) swabs in vial transport medium  Result Value Ref Range Status  SARS Coronavirus 2 by RT PCR NEGATIVE NEGATIVE Final    Comment: (NOTE) SARS-CoV-2 target nucleic acids are NOT DETECTED.  The SARS-CoV-2 RNA is generally detectable in upper respiratory specimens during the acute phase of infection. The lowest concentration of SARS-CoV-2 viral copies this assay can detect is 138 copies/mL. A negative result does not preclude SARS-Cov-2 infection and should not be used as the sole basis for treatment or other patient management decisions. A negative result may occur with  improper specimen collection/handling, submission of specimen other than nasopharyngeal swab, presence of viral mutation(s) within the areas targeted by this assay, and inadequate number of viral copies(<138 copies/mL). A negative result must be combined with clinical observations, patient history, and epidemiological information. The expected result is Negative.  Fact Sheet for Patients:  EntrepreneurPulse.com.au  Fact Sheet for Healthcare Providers:  IncredibleEmployment.be  This test is no t yet approved or cleared by the [place] FDA and  has been authorized for detection and/or diagnosis of SARS-CoV-2 by FDA under an Emergency Use Authorization (EUA). This EUA will remain  in effect (meaning this test can be used) for the duration of  the COVID-19 declaration under Section 564(b)(1) of the Act, 21 U.S.C.section 360bbb-3(b)(1), unless the authorization is terminated  or revoked sooner.       Influenza A by PCR NEGATIVE NEGATIVE Final   Influenza B by PCR NEGATIVE NEGATIVE Final    Comment: (NOTE) The Xpert Xpress SARS-CoV-2/FLU/RSV plus assay is intended as an aid in the diagnosis of influenza from Nasopharyngeal swab specimens and should not be used as a sole basis for treatment. Nasal washings and aspirates are unacceptable for Xpert Xpress SARS-CoV-2/FLU/RSV testing.  Fact Sheet for Patients: EntrepreneurPulse.com.au  Fact Sheet for Healthcare Providers: IncredibleEmployment.be  This test is not yet approved or cleared by the [place] FDA and has been authorized for detection and/or diagnosis of SARS-CoV-2 by FDA under an Emergency Use Authorization (EUA). This EUA will remain in effect (meaning this test can be used) for the duration of the COVID-19 declaration under Section 564(b)(1) of the Act, 21 U.S.C. section 360bbb-3(b)(1), unless the authorization is terminated or revoked.  Performed at [facility], [address] [place], [place] [address]      Medications:    tamsulosin  0.4 mg Oral Daily   Continuous Infusions:  lactated ringers 175 mL/hr at 07/22/20 0556      LOS: 0 days   Charlynne Cousins  [facility]  07/22/2020, 11:13 AM

## 2020-07-22 NOTE — Consult Note (Signed)
Urology Consult Note   Requesting Attending Physician:  Vernelle Emerald, MD Service Providing Consult: Urology   Reason for Consult:  Nephrolithiasis  HPI: GABE GLACE is seen in consultation for reasons noted above at the request of Shalhoub, Sherryll Burger, MD for evaluation of ureterolithiasis.  40 year old male with a history of nephrolithiasis.  He has a right upper ureteral stone for which he underwent ureteral stent placement by Dr. Lovena Neighbours on January 27, 2020.  He then followed with his local urologist in [facility] where he received shockwave lithotripsy followed by ureteral stent pull.  He re-presented to the emergency room on 07/18/2020 with recurrent flank pain.  CT scan continues to show upper ureteral stone.  Some proximal hydronephrosis.  He was discharged with as needed analgesics but over the past day has experienced increasing flank pain and vomiting.  He therefore returned to the emergency room.  This is his second visit in 3 days.  He is being treated with Dilaudid and antiemetic in the emergency room but continues to feel nauseous.   Reassuringly, he is afebrile and hemodynamically stable.  He does not have an acute kidney injury.  His urinalysis is negative for leukocyte esterase, nitrites.  His lactate is 1.2.  White blood cell count is 10.9. he patient denies any lower urinary tract symptoms, specifically dysuria, hematuria or urinary tract infection . He does endorse malodorous urine.  The patient has no history of fevers, chills, chest pain or shortness of breath. He denies a history of TUMS, current use of calcium supplementation, history of diarrhea, history of GI surgery, or history of gout.     Past Medical History: Past Medical History:  Diagnosis Date   Chronic pain syndrome    Depression    and panic/anxiety   GERD (gastroesophageal reflux disease)    History of kidney stones    Insomnia    Kidney stones    Seizures ([place]) Childhood    Past Surgical History:   Past Surgical History:  Procedure Laterality Date   APPENDECTOMY  2011   CHOLECYSTECTOMY, LAPAROSCOPIC     CYSTOSCOPY W/ URETERAL STENT PLACEMENT Right 01/27/2020   Procedure: CYSTOSCOPY WITH RETROGRADE PYELOGRAM/URETERAL STENT PLACEMENT;  Surgeon: Ceasar Mons, MD;  Location: [facility];  Service: Urology;  Laterality: Right;   CYSTOSCOPY/URETEROSCOPY/HOLMIUM LASER Left 06/15/2017   Procedure: CYSTOSCOPY/LEFT URETEROSCOPYSTONE EXTRACTION/LEFT RETROGRADE;  Surgeon: Franchot Gallo, MD;  Location: [facility];  Service: Urology;  Laterality: Left;  With STENT   HYDROCELE EXCISION Left 06/2012   LAPAROSCOPIC CHOLECYSTECTOMY  1996   LITHOTRIPSY     four times over the years; most recent 10/08/12   ORCHIECTOMY Left 11/2012   SPINAL CORD STIMULATOR INSERTION  2015   TONSILLECTOMY  1992   WRIST GANGLION EXCISION  2002   Right    Medication: Current Facility-Administered Medications  Medication Dose Route Frequency Provider Last Rate Last Admin   acetaminophen (TYLENOL) tablet 650 mg  650 mg Oral Q6H PRN Etta Quill, DO       Or   acetaminophen (TYLENOL) suppository 650 mg  650 mg Rectal Q6H PRN Etta Quill, DO       HYDROmorphone (DILAUDID) injection 0.5-1 mg  0.5-1 mg Intravenous Q2H PRN Etta Quill, DO   1 mg at 07/22/20 0500   lactated ringers infusion   Intravenous Continuous Etta Quill, DO 175 mL/hr at 07/22/20 0556 New Bag at 07/22/20 0556   ondansetron (ZOFRAN) tablet 4 mg  4 mg Oral Q6H  PRN Etta Quill, DO       Or   ondansetron [facility]) injection 4 mg  4 mg Intravenous Q6H PRN Etta Quill, DO   4 mg at 07/22/20 0043   tamsulosin (FLOMAX) capsule 0.4 mg  0.4 mg Oral Daily Etta Quill, DO        Allergies: Allergies  Allergen Reactions   Phenergan [Promethazine Hcl]    Tape Hives    Adhesive tape   Toradol [Ketorolac Tromethamine]     "feels like I am going to crawl out of my skin"   Trileptal [Oxcarbazepine] Other (See Comments)     Profound insomnia, worsening mood.    Wellbutrin [Bupropion]     H/o SZ d/o.     Social History: Social History   Tobacco Use   Smoking status: Never   Smokeless tobacco: Never  Vaping Use   Vaping Use: Never used  Substance Use Topics   Alcohol use: Not Currently    Alcohol/week: 0.0 standard drinks    Comment: rarely   Drug use: No    Family History Family History  Problem Relation Age of Onset   Stroke Mother    Liver disease Mother    Irritable bowel syndrome Mother    Kidney disease Mother    Heart disease Father    Colon polyps Father    Colon cancer Neg Hx     Review of Systems 10 systems were reviewed and are negative except as noted specifically in the HPI.  Objective   Vital signs in last 24 hours: BP (!) 139/106 (BP Location: Right Arm)   Pulse 88   Temp 98 F (36.7 C) (Oral)   Resp 20   Ht 6' (1.829 m)   Wt (!) 168.4 kg   SpO2 96%   BMI 50.35 kg/m   Physical Exam General: NAD, A&O, resting, appropriate HEENT: /AT, EOMI, MMM Pulmonary: Normal work of breathing on nasal cannula Cardiovascular: HDS, adequate peripheral perfusion Abdomen: Soft, NTTP, nondistended, right hemiabdominal tenderness. GU: voiding spontaneously, Right CVA tenderness Extremities: warm and well perfused Neuro: Appropriate, no focal neurological deficits  Most Recent Labs: Lab Results  Component Value Date   WBC 10.2 07/22/2020   HGB 18.0 (H) 07/22/2020   HCT 53.5 (H) 07/22/2020   PLT 287 07/22/2020    Lab Results  Component Value Date   NA 141 07/22/2020   K 4.8 07/22/2020   CL 101 07/22/2020   CO2 33 (H) 07/22/2020   BUN 11 07/22/2020   CREATININE 0.87 07/22/2020   CALCIUM 9.6 07/22/2020    No results found for: INR, APTT   Urine Culture: @[id number] (laburin,org,[id number],[id number])@   IMAGING: US Renal  Result Date: 07/21/2020 CLINICAL DATA:  40 year old male with right ureteral calculus. Worsening abdominal pain. EXAM: RENAL / URINARY TRACT  ULTRASOUND COMPLETE COMPARISON:  CT abdomen pelvis dated 07/18/2020. FINDINGS: Evaluation is limited due to body habitus. Right Kidney: Renal measurements: 11.9 x 6.1 x 5.1 cm = volume: 196 mL. Normal echogenicity. There is mild parenchyma atrophy. There is moderate hydronephrosis. No stone identified within the right kidney. Left Kidney: Renal measurements: 12.0 x 6.5 x 5.5 cm = volume: 225 mL. Normal echogenicity. Mild parenchyma atrophy. No hydronephrosis or shadowing stone. Bladder: The urinary bladder is mildly distended. The left ureteral jet noted. The right ureteral jet is not visualized. Other: None. IMPRESSION: Moderate right hydronephrosis with absent right ureteral jets. Findings concerning for an obstructing right ureteral calculus. Electronically Signed   By: Milas Hock  Radparvar M.D.   On: 07/21/2020 19:13   DG CHEST PORT 1 VIEW  Result Date: 07/22/2020 CLINICAL DATA:  Preop ureteral stone EXAM: PORTABLE CHEST 1 VIEW COMPARISON:  12/31/2019 FINDINGS: Cardiomegaly. Low lung volumes. No confluent opacities, effusions or edema. No acute bony abnormality. IMPRESSION: Cardiomegaly.  No active disease. Electronically Signed   By: Rolm Baptise M.D.   On: 07/22/2020 00:56    ------  Assessment:  40 y.o. male with PMH GERD, HTN, CPS on Norco, nephrolithiasis (Stent, ESWL in January 2022) who presented with right flank pain, N/V and CT that demonstrates 25mm right obstructing mid ureteral stone with  associated hydronephrosis.   Not concerning for infection based off the UA in the ER. Urine culture pending. Second ER visit in 3 days due to pain. I suspect that this sotne has been in the ureter for about five months now based on past imaging and history. (Stented here in January 2022, underwent ESWL and stent removal in [facility], records in [place]) but stone still present on CT.   We discussed the indications for acute intervention including infected obstruction, bilateral ureteral  obstruction or unilateral obstruction of solitary kidney as well as other less urgent indications for decompression which would included intractable pain, N/V, and acute renal injury. We also discussed the possible inability to place a ureteral stent in which case, the next intervention recommended would be a percutaneous nephrostomy tube. Given intractible pain/ N/V is present, plan for same day right ureteral stent placement, possible ureteroscopic stone extraction with laser lithotripsy.   Recommendations: Plan for same dayright ureteral stent placement Case posted for ureteroscopy today Keep patient NPO Consent to be obtained from patient in pre-op Sent urine culture at this time, prior to OR Negative COVID test    Thank you for this consult. Please contact the urology consult pager with any further questions/concerns.

## 2020-07-22 NOTE — Discharge Instructions (Signed)
1 - You may have urinary urgency (bladder spasms) and bloody urine on / off with stent in place. This is normal. ° °2 - Call MD or go to ER for fever >102, severe pain / nausea / vomiting not relieved by medications, or acute change in medical status ° °

## 2020-07-22 NOTE — Telephone Encounter (Signed)
Called patient and he was currently in the hospital for kidney stones. States they are going to remove them today.

## 2020-07-23 ENCOUNTER — Encounter (HOSPITAL_COMMUNITY): Payer: Self-pay | Admitting: Urology

## 2020-07-23 DIAGNOSIS — N179 Acute kidney failure, unspecified: Secondary | ICD-10-CM

## 2020-07-23 LAB — URINE CULTURE

## 2020-07-23 LAB — GRAM STAIN

## 2020-07-23 MED ORDER — HYDROMORPHONE HCL 2 MG PO TABS: 2.0000 mg | ORAL_TABLET | ORAL | Status: DC | PRN

## 2020-07-23 MED ORDER — HYDROCODONE-ACETAMINOPHEN 5-325 MG PO TABS: 1.0000 | ORAL_TABLET | Freq: Four times a day (QID) | ORAL | 0 refills | Status: DC | PRN

## 2020-07-23 MED ORDER — SULFAMETHOXAZOLE-TRIMETHOPRIM 800-160 MG PO TABS
1.0000 | ORAL_TABLET | Freq: Two times a day (BID) | ORAL | Status: DC
  Administered 2020-07-23: 1 via ORAL
  Filled 2020-07-23: qty 1

## 2020-07-23 MED ORDER — HYDROMORPHONE HCL 2 MG PO TABS: 2.0000 mg | ORAL_TABLET | ORAL | 0 refills | Status: DC | PRN

## 2020-07-23 MED ORDER — HYDROMORPHONE HCL 1 MG/ML IJ SOLN: 2.0000 mg | Freq: Once | INTRAMUSCULAR | Status: DC

## 2020-07-23 MED ORDER — SULFAMETHOXAZOLE-TRIMETHOPRIM 800-160 MG PO TABS: 1.0000 | ORAL_TABLET | Freq: Two times a day (BID) | ORAL | 0 refills | Status: AC

## 2020-07-23 NOTE — Telephone Encounter (Signed)
Patient called back and states he was released from the hospital today. He had the kidney stones removed but is still in a lot of pain. States his pain medications were supposed to be refilled at his OV. Patient requesting the hydrocodone and hydromorphone be sent in.

## 2020-07-23 NOTE — Telephone Encounter (Signed)
Patient notified rxs were sent.

## 2020-07-23 NOTE — Progress Notes (Signed)
Pt to be discharged to home today. Pt given discharge teaching/instructions including all discharge Medications and schedule for these Medications. Pt verbalized understanding of all discharge teaching. Discharge AVS paperwork with Pt at time of discharge.

## 2020-07-23 NOTE — Consult Note (Signed)
Urology Consult Note   Requesting Attending Physician:  Aileen Fass, Tammi Klippel, MD Service Providing Consult: Urology   Reason for Consult:  Nephrolithiasis  Casey Caldwell is seen in consultation for reasons noted above at the request of Charlynne Cousins, MD for evaluation of ureterolithiasis.  40 year old male with a history of nephrolithiasis.  He has a right upper ureteral stone now s/p right ureteroscopic stone extraction with Dr. Tresa Moore on 07/22/20.   Interval HPI 07/23/20:  Vital signs stable,   Past Medical History: Past Medical History:  Diagnosis Date   Chronic pain syndrome    Depression    and panic/anxiety   GERD (gastroesophageal reflux disease)    History of kidney stones    Insomnia    Kidney stones    Seizures ([place]) Childhood    Past Surgical History:  Past Surgical History:  Procedure Laterality Date   APPENDECTOMY  2011   CHOLECYSTECTOMY, LAPAROSCOPIC     CYSTOSCOPY W/ URETERAL STENT PLACEMENT Right 01/27/2020   Procedure: CYSTOSCOPY WITH RETROGRADE PYELOGRAM/URETERAL STENT PLACEMENT;  Surgeon: Ceasar Mons, MD;  Location: [facility];  Service: Urology;  Laterality: Right;   CYSTOSCOPY/URETEROSCOPY/HOLMIUM LASER Left 06/15/2017   Procedure: CYSTOSCOPY/LEFT URETEROSCOPYSTONE EXTRACTION/LEFT RETROGRADE;  Surgeon: Franchot Gallo, MD;  Location: [facility];  Service: Urology;  Laterality: Left;  With STENT   HYDROCELE EXCISION Left 06/2012   LAPAROSCOPIC CHOLECYSTECTOMY  1996   LITHOTRIPSY     four times over the years; most recent 10/08/12   ORCHIECTOMY Left 11/2012   SPINAL CORD STIMULATOR INSERTION  2015   TONSILLECTOMY  1992   WRIST GANGLION EXCISION  2002   Right    Medication: Current Facility-Administered Medications  Medication Dose Route Frequency Provider Last Rate Last Admin   acetaminophen (TYLENOL) tablet 650 mg  650 mg Oral Q6H PRN Alexis Frock, MD       Or   acetaminophen (TYLENOL) suppository 650 mg  650 mg Rectal Q6H PRN Alexis Frock, MD       ARIPiprazole (ABILIFY) tablet 2.5 mg  2.5 mg Oral Daily Alexis Frock, MD       busPIRone (BUSPAR) tablet 5 mg  5 mg Oral BID Alexis Frock, MD   5 mg at 07/22/20 2143   colchicine tablet 0.6 mg  0.6 mg Oral BID PRN Alexis Frock, MD       escitalopram (LEXAPRO) tablet 20 mg  20 mg Oral Daily Alexis Frock, MD       HYDROmorphone (DILAUDID) injection 2 mg  2 mg Intravenous Q2H PRN Alexis Frock, MD   2 mg at 07/23/20 0403   lactated ringers infusion   Intravenous Continuous Alexis Frock, MD 175 mL/hr at 07/23/20 0300 New Bag at 07/23/20 0300   lisinopril (ZESTRIL) tablet 10 mg  10 mg Oral Daily Alexis Frock, MD       melatonin tablet 10 mg  10 mg Oral QHS Alexis Frock, MD   10 mg at 07/22/20 2143   ondansetron (ZOFRAN) tablet 4 mg  4 mg Oral Q6H PRN Alexis Frock, MD       Or   ondansetron [facility]) injection 4 mg  4 mg Intravenous Q6H PRN Alexis Frock, MD   4 mg at 07/23/20 0403   pantoprazole (PROTONIX) EC tablet 40 mg  40 mg Oral BID Alexis Frock, MD   40 mg at 07/22/20 2143   polyethylene glycol (MIRALAX / GLYCOLAX) packet 17 g  17 g Oral BID Alexis Frock, MD  promethazine (PHENERGAN) tablet 12.5 mg  12.5 mg Oral Q6H PRN Alexis Frock, MD       Or   promethazine (PHENERGAN) 12.5 mg in sodium chloride 0.9 % 50 mL IVPB  12.5 mg Intravenous Q6H PRN Alexis Frock, MD 200 mL/hr at 07/22/20 1155 12.5 mg at 07/22/20 1155   Or   promethazine (PHENERGAN) suppository 12.5 mg  12.5 mg Rectal Q6H PRN Alexis Frock, MD       tamsulosin (FLOMAX) capsule 0.4 mg  0.4 mg Oral Daily Alexis Frock, MD        Allergies: Allergies  Allergen Reactions   Phenergan [Promethazine Hcl]    Tape Hives    Adhesive tape   Toradol [Ketorolac Tromethamine]     "feels like I am going to crawl out of my skin"   Trileptal [Oxcarbazepine] Other (See Comments)    Profound insomnia, worsening mood.    Wellbutrin [Bupropion]     H/o SZ d/o.     Social  History: Social History   Tobacco Use   Smoking status: Never   Smokeless tobacco: Never  Vaping Use   Vaping Use: Never used  Substance Use Topics   Alcohol use: Not Currently    Alcohol/week: 0.0 standard drinks    Comment: rarely   Drug use: No    Family History Family History  Problem Relation Age of Onset   Stroke Mother    Liver disease Mother    Irritable bowel syndrome Mother    Kidney disease Mother    Heart disease Father    Colon polyps Father    Colon cancer Neg Hx     Review of Systems 10 systems were reviewed and are negative except as noted specifically in the HPI.  Objective   Vital signs in last 24 hours: BP 133/79 (BP Location: Left Arm)   Pulse 81   Temp 97.7 F (36.5 C)   Resp 20   Ht 6' (1.829 m)   Wt (!) 168.4 kg   SpO2 95%   BMI 50.35 kg/m   Physical Exam General: NAD, A&O, resting, appropriate HEENT: [place]/AT, EOMI, MMM Pulmonary: Normal work of breathing on nasal cannula Cardiovascular: HDS, adequate peripheral perfusion Abdomen: Soft, NTTP, nondistended, GU: voiding spontaneously Extremities: warm and well perfused Neuro: Appropriate, no focal neurological deficits  Most Recent Labs: Lab Results  Component Value Date   WBC 10.2 07/22/2020   HGB 18.0 (H) 07/22/2020   HCT 53.5 (H) 07/22/2020   PLT 287 07/22/2020    Lab Results  Component Value Date   NA 141 07/22/2020   K 4.8 07/22/2020   CL 101 07/22/2020   CO2 33 (H) 07/22/2020   BUN 11 07/22/2020   CREATININE 0.87 07/22/2020   CALCIUM 9.6 07/22/2020    No results found for: INR, APTT   Urine Culture: @[id number] (laburin,org,[id number],[id number])@   IMAGING: US Renal  Result Date: 07/21/2020 CLINICAL DATA:  40 year old male with right ureteral calculus. Worsening abdominal pain. EXAM: RENAL / URINARY TRACT ULTRASOUND COMPLETE COMPARISON:  CT abdomen pelvis dated 07/18/2020. FINDINGS: Evaluation is limited due to body habitus. Right Kidney: Renal measurements: 11.9 x 6.1  x 5.1 cm = volume: 196 mL. Normal echogenicity. There is mild parenchyma atrophy. There is moderate hydronephrosis. No stone identified within the right kidney. Left Kidney: Renal measurements: 12.0 x 6.5 x 5.5 cm = volume: 225 mL. Normal echogenicity. Mild parenchyma atrophy. No hydronephrosis or shadowing stone. Bladder: The urinary bladder is mildly distended. The left ureteral jet noted. The  right ureteral jet is not visualized. Other: None. IMPRESSION: Moderate right hydronephrosis with absent right ureteral jets. Findings concerning for an obstructing right ureteral calculus. Electronically Signed   By: Anner Crete M.D.   On: 07/21/2020 19:13   DG CHEST PORT 1 VIEW  Result Date: 07/22/2020 CLINICAL DATA:  Preop ureteral stone EXAM: PORTABLE CHEST 1 VIEW COMPARISON:  12/31/2019 FINDINGS: Cardiomegaly. Low lung volumes. No confluent opacities, effusions or edema. No acute bony abnormality. IMPRESSION: Cardiomegaly.  No active disease. Electronically Signed   By: Rolm Baptise M.D.   On: 07/22/2020 00:56   DG C-Arm 1-60 Min-No Report  Result Date: 07/22/2020 Fluoroscopy was utilized by the requesting physician.  No radiographic interpretation.    ------  Assessment:  40 y.o. 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   Now s/p Right USE with Dr. Tresa Moore on 07/22/20 and recovering well. Gram stain negative for organisms. Urine culture pending.    Recommendations: Stent will be removed in clinic in ~2 weeks. We will arrange this. Patient will need three days of antibiotics to begin on the day prior to stent pull. Culture appropritate. (Urine culture is pending form intra-op) Clinic appointment in 6 weeks with [facility] Otherwise appropriate for discharge from urologic standpoint. Can go home with scheduled flomax and  prn analgesics.    Thank you for this consult. Please contact the urology consult pager with any further questions/concerns.

## 2020-07-23 NOTE — Telephone Encounter (Signed)
Sent. Thanks.   

## 2020-07-23 NOTE — Op Note (Signed)
NAME: Casey Caldwell, Casey Caldwell MEDICAL RECORD NO: [id number] ACCOUNT NO: [id number] DATE OF BIRTH: [birth year] FACILITY: [facility] LOCATION: [id number] PHYSICIAN: Alexis Frock, MD  Operative Report   DATE OF PROCEDURE: 07/22/2020  PREOPERATIVE DIAGNOSIS:  Right ureteral stone with refractory colic.  PROCEDURES PERFORMED: 1.  Cystoscopy, right retrograde pyelogram interpretation. 2.  Right ureteroscopy with laser lithotripsy. 3.  Insertion of right ureteral stent, 5 x 26 Polaris, no tether.  ESTIMATED BLOOD LOSS:  Nil.  COMPLICATIONS:  None.  SPECIMEN:  Right ureteral and renal stone fragments for composition analysis.  FINDINGS:  1.  Severely impacted right proximal ureteral stone with moderate hydronephrosis and several small papillary tip calcifications.  2.  Complete resolution of all accessible stone fragments larger than one-third mm following laser lithotripsy and basket extraction. 3.  Successful placement of right ureteral stent, proximal end in the renal pelvis, distal end in the urinary bladder. 4.  In situ spinal stimulator to the right side of midline.  INDICATIONS FOR PROCEDURE:  The patient is an unfortunate 40 year old man with history of morbid obesity, recurrent urolithiasis.  He has had a prolonged bout with a proximal right ureteral stone with refractory colic.  He underwent stenting as a  temporizing measure and then shockwave lithotripsy at another institution.  However, he still had severe colicky symptoms.  He underwent repeat axial imaging, which unfortunately revealed persistence of his right proximal ureteral stone with essentially  no changes after shockwave lithotripsy versus right hydronephrosis.  Options were discussed for further management including recommend path of ureteroscopy with goal of stone free and he wished to proceed.  Informed consent was obtained and placed in  medical record.  PROCEDURE IN DETAIL:  The patient being verified, procedure being right  ureteroscopy with stent placement was confirmed.  Procedure timeout was performed.  Intravenous antibiotics administered.  General LMA anesthesia induced.  The patient was placed  into a low lithotomy position, sterile field was created, prepped and draped the patient's penis, perineum and proximal thighs using iodine.  Cystourethroscopy was performed using a 21-French rigid cystoscope with offset lens.  Inspection of anterior and  posterior urethra were unremarkable.  Inspection of bladder revealed no diverticula, calcifications or papillary lesions.  The right ureteral orifice was cannulated with a 6-French renal catheter and right retrograde pyelogram was obtained.  Right retrograde pyelogram demonstrated a single right ureter, single system right kidney.  There was a filling defect in the proximal ureter consistent with known stone.  There was minimal retrograde contrast beyond this signifying significant  obstruction.  There was a spinal stimulator noted in very close proximity.  A 0.038 ZIPwire was very carefully advanced and fortunately was able to pass proximal to the stone, set aside as a safety wire.  An 8-French feeding tube placed in the urinary  bladder for pressure release.  Semirigid ureteroscopy performed of the distal right ureter alongside a separate sensor working wire.  No mucosal abnormalities were found.  The semirigid scope was then exchanged for a 12/14 medium length ureteral access  sheath to the level of proximal ureter using continuous fluoroscopic guidance.  Flexible digital ureteroscopy revealed a severely impacted right proximal ureteral stone.  There was major mucosal edema around this.  This is much too large for simple  basketing. Holmium laser energy was then applied to the stone using setting of 0.2 joules and 20 Hz and the stone was fragmented into approximately four smaller pieces, which were then retrograde positioned to the kidney, to upper mid pole  calix where   additional energy was applied, generating fragments approximately of 1 to 2 mm in diameter.  These were then sequentially grasped with an escape basket and removed, set aside for composition analysis.  There were several additional foci of papillary tip  calcifications that were amenable to simple basketing or laser lithotripsy.  Following this, complete resolution of all accessible stone fragments larger than one-third mm, we achieved the goal of surgery today.  Access sheath removed under continuous  vision.  There again remained some mucosal edema at the site of prior stone impaction, but no obvious stone material.  Given his prolonged obstruction and impaction, it was felt that interval stenting with a nontethered stent would be most prudent.  As  such, a new 5 x 26 Polaris type stent was placed over the remaining safety wire using fluoroscopic guidance.  Good proximal and distal plane were noted, and the procedure was terminated.  The patient tolerated the procedure well.  No immediate  perioperative complications.  The patient taken to postanesthesia care in stable condition.  PLAN:  For continued hospital admission, hopeful discharge tomorrow.   SHW D: 07/22/2020 5:49:21 pm T: 07/23/2020 12:42:00 am  JOB: [id number]/ [id number]

## 2020-07-23 NOTE — Discharge Summary (Signed)
Physician Discharge Summary  Casey Caldwell ACZ:[id number] DOB: June 30, 1980 DOA: 07/21/2020  PCP: Tonia Ghent, MD  Admit date: 07/21/2020 Discharge date: 07/23/2020  Admitted From: home Disposition:  Home  Recommendations for Outpatient Follow-up:  Follow up with Urology in 1-2 weeks Please obtain BMP/CBC in one week   Home Health:No Equipment/Devices:None  Discharge Condition:Stable CODE STATUS:Full Diet recommendation: Heart Healthy Brief/Interim Summary: 40 y.o. male past medical history of nephrolithiasis and has undergone stent placement by Dr. Gilford Rile in January 27, 2020, during this time he had shock lithotripsy and stent placement essential hypertension presented to [facility] with severe right flank pain, CT scan confirmed 7 mm obstructive right urethral stone started on Flomax urology has been consulted    Discharge Diagnoses:  Principal Problem:   Right ureteral stone Active Problems:   Hypertension   Chronic back pain greater than 3 months duration   AKI (acute kidney injury) ([place])   Ureteral stone with hydronephrosis  Right ureteral stone: Urology has been consulted and performed endoscopy with retrograde pyelogram with laser lithotripsy and stent insertion on 07/22/2020. His pain was improved, he was discharged in stable condition will follow up with urology in 2 weeks.  Essential hypertension: No changes made to his medication.  Acute kidney injury: Resolved with cystoscopy and lithotripsy.  Leukocytosis: He remained afebrile urine cultures were sent he will be sent home on Bactrim for 3 days. Urine cultures were sent follow-up with urology as an outpatient.  Discharge Instructions  Discharge Instructions     Diet - low sodium heart healthy   Complete by: As directed    Increase activity slowly   Complete by: As directed       Allergies as of 07/23/2020       Reactions   Phenergan [promethazine Hcl]    Tape Hives   Adhesive tape    Toradol [ketorolac Tromethamine]    "feels like I am going to crawl out of my skin"   Trileptal [oxcarbazepine] Other (See Comments)   Profound insomnia, worsening mood.    Wellbutrin [bupropion]    H/o SZ d/o.         Medication List     STOP taking these medications    HYDROmorphone 2 MG tablet Commonly known as: DILAUDID   triamcinolone cream 0.5 % Commonly known as: KENALOG       TAKE these medications    allopurinol 100 MG tablet Commonly known as: ZYLOPRIM Take 1 tablet (100 mg total) by mouth daily.   ARIPiprazole 5 MG tablet Commonly known as: ABILIFY Take 0.5 tablets (2.5 mg total) by mouth daily.   busPIRone 5 MG tablet Commonly known as: BUSPAR Take 1 tablet (5 mg total) by mouth 2 (two) times daily.   Colchicine 0.6 MG Caps Take 1 tablet by mouth 2 (two) times daily as needed (gout flare).   docusate sodium 100 MG capsule Commonly known as: COLACE Take 1 capsule (100 mg total) by mouth 2 (two) times daily as needed for mild constipation.   escitalopram 20 MG tablet Commonly known as: LEXAPRO Take 1 tablet (20 mg total) by mouth daily.   gabapentin 100 MG capsule Commonly known as: NEURONTIN Take 1 capsule (100 mg total) by mouth 3 (three) times daily as needed. What changed: reasons to take this   HYDROcodone-acetaminophen 5-325 MG tablet Commonly known as: NORCO/VICODIN Take 1-2 tablets by mouth every 6 (six) hours as needed for severe pain (8 tabs or less per day).  indapamide 2.5 MG tablet Commonly known as: LOZOL Take 1 tablet (2.5 mg total) by mouth daily.   lisinopril 10 MG tablet Commonly known as: ZESTRIL Take 1 tablet (10 mg total) by mouth daily.   loratadine 10 MG tablet Commonly known as: CLARITIN Take 1 tablet (10 mg total) by mouth daily as needed for allergies.   Melatonin 10 MG Tabs Take 10 mg by mouth at bedtime.   ondansetron 8 MG disintegrating tablet Commonly known as: Zofran ODT Take 1 tablet (8 mg total) by  mouth every 8 (eight) hours as needed for nausea or vomiting.   pantoprazole 40 MG tablet Commonly known as: PROTONIX Take 1 tablet (40 mg total) by mouth 2 (two) times daily.   polyethylene glycol powder 17 GM/SCOOP powder Commonly known as: GLYCOLAX/MIRALAX MIX 17G IN 4 TO 8 OUNCES OF FLUID AND TAKE TWICE DAILY What changed:  how much to take how to take this when to take this reasons to take this additional instructions   potassium citrate 10 MEQ (1080 MG) SR tablet Commonly known as: UROCIT-K Take 1 tablet (10 mEq total) by mouth 2 (two) times daily.   sulfamethoxazole-trimethoprim 800-160 MG tablet Commonly known as: BACTRIM DS Take 1 tablet by mouth every 12 (twelve) hours for 3 days.   tamsulosin 0.4 MG Caps capsule Commonly known as: FLOMAX Take 1 capsule (0.4 mg total) by mouth daily.   zolpidem 10 MG tablet Commonly known as: AMBIEN TAKE ONE TABLET BY MOUTH EVERY NIGHT AT BEDTIME AS NEEDED FOR SLEEP What changed:  how much to take how to take this when to take this reasons to take this additional instructions        Follow-up Information     Alexis Frock, MD Follow up.   Specialty: Urology Why: Office will call to arrange post-op visit and office stent removal. Contact information: [place] [address] [phone]                Allergies  Allergen Reactions   Phenergan [Promethazine Hcl]    Tape Hives    Adhesive tape   Toradol [Ketorolac Tromethamine]     "feels like I am going to crawl out of my skin"   Trileptal [Oxcarbazepine] Other (See Comments)    Profound insomnia, worsening mood.    Wellbutrin [Bupropion]     H/o SZ d/o.     Consultations: Urology   Procedures/Studies: US Renal  Result Date: 07/21/2020 CLINICAL DATA:  40 year old male with right ureteral calculus. Worsening abdominal pain. EXAM: RENAL / URINARY TRACT ULTRASOUND COMPLETE COMPARISON:  CT abdomen pelvis dated 07/18/2020. FINDINGS:  Evaluation is limited due to body habitus. Right Kidney: Renal measurements: 11.9 x 6.1 x 5.1 cm = volume: 196 mL. Normal echogenicity. There is mild parenchyma atrophy. There is moderate hydronephrosis. No stone identified within the right kidney. Left Kidney: Renal measurements: 12.0 x 6.5 x 5.5 cm = volume: 225 mL. Normal echogenicity. Mild parenchyma atrophy. No hydronephrosis or shadowing stone. Bladder: The urinary bladder is mildly distended. The left ureteral jet noted. The right ureteral jet is not visualized. Other: None. IMPRESSION: Moderate right hydronephrosis with absent right ureteral jets. Findings concerning for an obstructing right ureteral calculus. Electronically Signed   By: Anner Crete M.D.   On: 07/21/2020 19:13   DG CHEST PORT 1 VIEW  Result Date: 07/22/2020 CLINICAL DATA:  Preop ureteral stone EXAM: PORTABLE CHEST 1 VIEW COMPARISON:  12/31/2019 FINDINGS: Cardiomegaly. Low lung volumes. No confluent opacities, effusions  or edema. No acute bony abnormality. IMPRESSION: Cardiomegaly.  No active disease. Electronically Signed   By: Rolm Baptise M.D.   On: 07/22/2020 00:56   DG C-Arm 1-60 Min-No Report  Result Date: 07/22/2020 Fluoroscopy was utilized by the requesting physician.  No radiographic interpretation.   CT Renal Stone Study  Result Date: 07/18/2020 CLINICAL DATA:  Right flank pain. EXAM: CT ABDOMEN AND PELVIS WITHOUT CONTRAST TECHNIQUE: Multidetector CT imaging of the abdomen and pelvis was performed following the standard protocol without IV contrast. COMPARISON:  January 27, 2020 FINDINGS: Lower chest: No acute abnormality. Hepatobiliary: There is diffuse fatty infiltration of the liver parenchyma. No focal liver abnormality is seen. Status post cholecystectomy. No biliary dilatation. Pancreas: Unremarkable. No pancreatic ductal dilatation or surrounding inflammatory changes. Spleen: Normal in size without focal abnormality. Adrenals/Urinary Tract: Adrenal glands are  unremarkable. Kidneys are normal in size, without focal lesions. Multiple 2 mm and 3 mm nonobstructing renal stones are seen within the left kidney. An additional 2 mm nonobstructing renal stone is noted within the mid to lower right kidney. A 7 mm obstructing renal stone is noted within the proximal right ureter. Moderate severity right-sided hydronephrosis and proximal hydroureter are noted. Bladder is unremarkable. Stomach/Bowel: Stomach is within normal limits. The appendix is surgically absent. No evidence of bowel wall thickening, distention, or inflammatory changes. Vascular/Lymphatic: No significant vascular findings are present. No enlarged abdominal or pelvic lymph nodes. Reproductive: Prostate is unremarkable. Other: No abdominal wall hernia or abnormality. No abdominopelvic ascites. Musculoskeletal: A spinal stimulator device is in place. No acute or significant osseous findings. IMPRESSION: 1. 7 mm obstructing renal stone within the proximal right ureter. 2. Bilateral subcentimeter nonobstructing renal calculi. 3. Evidence of prior cholecystectomy. Electronically Signed   By: Virgina Norfolk M.D.   On: 07/18/2020 23:13   (Echo, Carotid, EGD, Colonoscopy, ERCP)    Subjective: No complaints  Discharge Exam: Vitals:   07/23/20 0333 07/23/20 0403  BP: 133/79   Pulse: 81   Resp: 20   Temp: 97.7 F (36.5 C)   SpO2: 92% 95%   Vitals:   07/22/20 2315 07/23/20 0029 07/23/20 0333 07/23/20 0403  BP:   133/79   Pulse:   81   Resp:   20   Temp:   97.7 F (36.5 C)   TempSrc:      SpO2: 95% 94% 92% 95%  Weight:      Height:        General: Pt is alert, awake, not in acute distress Cardiovascular: RRR, S1/S2 +, no rubs, no gallops Respiratory: CTA bilaterally, no wheezing, no rhonchi Abdominal: Soft, NT, ND, bowel sounds + Extremities: no edema, no cyanosis    The results of significant diagnostics from this hospitalization (including imaging, microbiology, ancillary and  laboratory) are listed below for reference.     Microbiology: Recent Results (from the past 240 hour(s))  Urine culture     Status: Abnormal   Collection Time: 07/21/20  4:20 PM   Specimen: Urine, Random  Result Value Ref Range Status   Specimen Description   Final    URINE, RANDOM Performed at [facility], [address] [place], [place] [address]    Special Requests   Final    NONE Performed at [facility], [address] [place], [place] [address]    Culture MULTIPLE SPECIES PRESENT, SUGGEST RECOLLECTION (A)  Final   Report Status 07/23/2020 FINAL  Final  Resp Panel by RT-PCR (Flu  A&B, Covid) Nasopharyngeal Swab     Status: None   Collection Time: 07/21/20  9:23 PM   Specimen: Nasopharyngeal Swab; Nasopharyngeal(NP) swabs in vial transport medium  Result Value Ref Range Status   SARS Coronavirus 2 by RT PCR NEGATIVE NEGATIVE Final    Comment: (NOTE) SARS-CoV-2 target nucleic acids are NOT DETECTED.  The SARS-CoV-2 RNA is generally detectable in upper respiratory specimens during the acute phase of infection. The lowest concentration of SARS-CoV-2 viral copies this assay can detect is 138 copies/mL. A negative result does not preclude SARS-Cov-2 infection and should not be used as the sole basis for treatment or other patient management decisions. A negative result may occur with  improper specimen collection/handling, submission of specimen other than nasopharyngeal swab, presence of viral mutation(s) within the areas targeted by this assay, and inadequate number of viral copies(<138 copies/mL). A negative result must be combined with clinical observations, patient history, and epidemiological information. The expected result is Negative.  Fact Sheet for Patients:  EntrepreneurPulse.com.au  Fact Sheet for Healthcare Providers:  IncredibleEmployment.be  This test is no t yet approved or cleared by the [place] FDA and  has been authorized for detection and/or diagnosis of SARS-CoV-2 by FDA under an Emergency Use Authorization (EUA). This EUA will remain  in effect (meaning this test can be used) for the duration of the COVID-19 declaration under Section 564(b)(1) of the Act, 21 U.S.C.section 360bbb-3(b)(1), unless the authorization is terminated  or revoked sooner.       Influenza A by PCR NEGATIVE NEGATIVE Final   Influenza B by PCR NEGATIVE NEGATIVE Final    Comment: (NOTE) The Xpert Xpress SARS-CoV-2/FLU/RSV plus assay is intended as an aid in the diagnosis of influenza from Nasopharyngeal swab specimens and should not be used as a sole basis for treatment. Nasal washings and aspirates are unacceptable for Xpert Xpress SARS-CoV-2/FLU/RSV testing.  Fact Sheet for Patients: EntrepreneurPulse.com.au  Fact Sheet for Healthcare Providers: IncredibleEmployment.be  This test is not yet approved or cleared by the [place] FDA and has been authorized for detection and/or diagnosis of SARS-CoV-2 by FDA under an Emergency Use Authorization (EUA). This EUA will remain in effect (meaning this test can be used) for the duration of the COVID-19 declaration under Section 564(b)(1) of the Act, 21 U.S.C. section 360bbb-3(b)(1), unless the authorization is terminated or revoked.  Performed at [facility], [address] [place], [place] [address]   Gram stain     Status: None   Collection Time: 07/22/20  5:14 PM   Specimen: Urine, Cystoscope  Result Value Ref Range Status   Specimen Description   Final    URINE, RANDOM CYSTO Performed at [facility], [place] [address], [place], [place] [address]    Special Requests   Final    NONE Performed at [facility], [place] [address] [place], [place] [address]    Gram Stain   Final    WBC PRESENT,BOTH PMN AND MONONUCLEAR NO ORGANISMS SEEN CYTOSPIN  SMEAR Performed at [facility], [place] [address], [place], [place] [address]    Report Status 07/23/2020 FINAL  Final  Anaerobic culture w Gram Stain     Status: None (Preliminary result)   Collection Time: 07/22/20  5:14 PM   Specimen: Urine, Cystoscope  Result Value Ref Range Status   Specimen Description   Final    URINE, RANDOM CYSTO Performed at [facility], [place] [address]  [place], [place] [address]    Special Requests   Final    NONE Performed at [facility], [place] [address], [place], [place] [address]    Gram Stain   Final    FEW WBC PRESENT, PREDOMINANTLY MONONUCLEAR NO ORGANISMS SEEN Performed at [facility], [place] [address] [place], [place] [address]    Culture PENDING  Incomplete   Report Status PENDING  Incomplete     Labs: BNP (last 3 results) No results for input(s): BNP in the last 8760 hours. Basic Metabolic Panel: Recent Labs  Lab 07/18/20 1821 07/21/20 1836 07/22/20 0457  NA 140 137 141  K 4.3 3.8 4.8  CL 102 99 101  CO2 33* 30 33*  GLUCOSE 102* 108* 125*  BUN 13 10 11   CREATININE 1.17 0.98 0.87  CALCIUM 9.2 9.5 9.6   Liver Function Tests: Recent Labs  Lab 07/18/20 1821 07/21/20 1836  AST 66* 77*  ALT 75* 89*  ALKPHOS 68 69  BILITOT 0.7 1.1  PROT 7.2 7.9  ALBUMIN 4.2 4.6   Recent Labs  Lab 07/21/20 1836  LIPASE 29   No results for input(s): AMMONIA in the last 168 hours. CBC: Recent Labs  Lab 07/18/20 1821 07/21/20 1836 07/22/20 0457  WBC 9.4 10.9* 10.2  NEUTROABS 5.0 7.9*  --   HGB 17.2* 18.6* 18.0*  HCT 50.8 53.2* 53.5*  MCV 90.9 89.0 91.3  PLT 324 334 287   Cardiac Enzymes: No results for input(s): CKTOTAL, CKMB, CKMBINDEX, TROPONINI in the last 168 hours. BNP: Invalid input(s): POCBNP CBG: No results for input(s): GLUCAP in the last 168 hours. D-Dimer No results for input(s): DDIMER in the last 72 hours. Hgb A1c No results for input(s): HGBA1C in the last 72  hours. Lipid Profile No results for input(s): CHOL, HDL, LDLCALC, TRIG, CHOLHDL, LDLDIRECT in the last 72 hours. Thyroid function studies No results for input(s): TSH, T4TOTAL, T3FREE, THYROIDAB in the last 72 hours.  Invalid input(s): FREET3 Anemia work up No results for input(s): VITAMINB12, FOLATE, FERRITIN, TIBC, IRON, RETICCTPCT in the last 72 hours. Urinalysis    Component Value Date/Time   COLORURINE YELLOW 07/21/2020 1620   APPEARANCEUR CLEAR 07/21/2020 1620   LABSPEC 1.015 07/21/2020 1620   PHURINE 7.0 07/21/2020 1620   GLUCOSEU NEGATIVE 07/21/2020 1620   HGBUR NEGATIVE 07/21/2020 1620   BILIRUBINUR NEGATIVE 07/21/2020 1620   KETONESUR NEGATIVE 07/21/2020 1620   PROTEINUR NEGATIVE 07/21/2020 1620   UROBILINOGEN 1.0 10/20/2014 1820   NITRITE NEGATIVE 07/21/2020 1620   LEUKOCYTESUR NEGATIVE 07/21/2020 1620   Sepsis Labs Invalid input(s): PROCALCITONIN,  WBC,  LACTICIDVEN Microbiology Recent Results (from the past 240 hour(s))  Urine culture     Status: Abnormal   Collection Time: 07/21/20  4:20 PM   Specimen: Urine, Random  Result Value Ref Range Status   Specimen Description   Final    URINE, RANDOM Performed at [facility], [address] [place], [place] [address]    Special Requests   Final    NONE Performed at [facility], [address] [place], [place] [address]    Culture MULTIPLE SPECIES PRESENT, SUGGEST RECOLLECTION (A)  Final   Report Status 07/23/2020 FINAL  Final  Resp Panel by RT-PCR (Flu A&B, Covid) Nasopharyngeal Swab     Status: None   Collection Time: 07/21/20  9:23 PM   Specimen: Nasopharyngeal Swab; Nasopharyngeal(NP) swabs in vial transport medium  Result Value Ref Range Status  SARS Coronavirus 2 by RT PCR NEGATIVE NEGATIVE Final    Comment: (NOTE) SARS-CoV-2 target nucleic acids are NOT DETECTED.  The SARS-CoV-2 RNA is generally detectable in upper respiratory specimens during the acute phase of infection.  The lowest concentration of SARS-CoV-2 viral copies this assay can detect is 138 copies/mL. A negative result does not preclude SARS-Cov-2 infection and should not be used as the sole basis for treatment or other patient management decisions. A negative result may occur with  improper specimen collection/handling, submission of specimen other than nasopharyngeal swab, presence of viral mutation(s) within the areas targeted by this assay, and inadequate number of viral copies(<138 copies/mL). A negative result must be combined with clinical observations, patient history, and epidemiological information. The expected result is Negative.  Fact Sheet for Patients:  EntrepreneurPulse.com.au  Fact Sheet for Healthcare Providers:  IncredibleEmployment.be  This test is no t yet approved or cleared by the [place] FDA and  has been authorized for detection and/or diagnosis of SARS-CoV-2 by FDA under an Emergency Use Authorization (EUA). This EUA will remain  in effect (meaning this test can be used) for the duration of the COVID-19 declaration under Section 564(b)(1) of the Act, 21 U.S.C.section 360bbb-3(b)(1), unless the authorization is terminated  or revoked sooner.       Influenza A by PCR NEGATIVE NEGATIVE Final   Influenza B by PCR NEGATIVE NEGATIVE Final    Comment: (NOTE) The Xpert Xpress SARS-CoV-2/FLU/RSV plus assay is intended as an aid in the diagnosis of influenza from Nasopharyngeal swab specimens and should not be used as a sole basis for treatment. Nasal washings and aspirates are unacceptable for Xpert Xpress SARS-CoV-2/FLU/RSV testing.  Fact Sheet for Patients: EntrepreneurPulse.com.au  Fact Sheet for Healthcare Providers: IncredibleEmployment.be  This test is not yet approved or cleared by the [place] FDA and has been authorized for detection and/or diagnosis of SARS-CoV-2 by FDA  under an Emergency Use Authorization (EUA). This EUA will remain in effect (meaning this test can be used) for the duration of the COVID-19 declaration under Section 564(b)(1) of the Act, 21 U.S.C. section 360bbb-3(b)(1), unless the authorization is terminated or revoked.  Performed at [facility], [address] [place], [place] [address]   Gram stain     Status: None   Collection Time: 07/22/20  5:14 PM   Specimen: Urine, Cystoscope  Result Value Ref Range Status   Specimen Description   Final    URINE, RANDOM CYSTO Performed at [facility], [place] [address] [place], [place] [address]    Special Requests   Final    NONE Performed at [facility], [place] [address], [place], [place] [address]    Gram Stain   Final    WBC PRESENT,BOTH PMN AND MONONUCLEAR NO ORGANISMS SEEN CYTOSPIN SMEAR Performed at [facility], [place] [address], [place], [place] [address]    Report Status 07/23/2020 FINAL  Final  Anaerobic culture w Gram Stain     Status: None (Preliminary result)   Collection Time: 07/22/20  5:14 PM   Specimen: Urine, Cystoscope  Result Value Ref Range Status   Specimen Description   Final    URINE, RANDOM CYSTO Performed at [facility], [place] [address] [place], [place] [address]    Special Requests   Final    NONE Performed at [facility], [place] [address], [place], [place] [address]    Gram Stain   Final  FEW WBC PRESENT, PREDOMINANTLY MONONUCLEAR NO ORGANISMS SEEN Performed at [place] [address], [place], [place] [address]    Culture PENDING  Incomplete   Report Status PENDING  Incomplete     Time coordinating discharge: Over 30 minutes  SIGNED:   Charlynne Cousins, MD  [facility] 07/23/2020, 7:49 AM Pager   If 7PM-7AM, please contact night-coverage [email or web address] Password [email or web address]

## 2020-07-23 NOTE — Addendum Note (Signed)
Addended by: Tonia Ghent on: 07/23/2020 03:37 PM   Modules accepted: Orders

## 2020-07-24 LAB — URINE CULTURE: Culture: NO GROWTH

## 2020-07-27 LAB — ANAEROBIC CULTURE W GRAM STAIN

## 2020-07-29 ENCOUNTER — Encounter: Payer: Self-pay | Admitting: Family Medicine

## 2020-07-29 ENCOUNTER — Other Ambulatory Visit: Payer: Self-pay | Admitting: Family Medicine

## 2020-08-13 ENCOUNTER — Ambulatory Visit: Payer: Self-pay | Admitting: *Deleted

## 2020-08-13 DIAGNOSIS — F32A Depression, unspecified: Secondary | ICD-10-CM

## 2020-08-13 DIAGNOSIS — N200 Calculus of kidney: Secondary | ICD-10-CM

## 2020-08-13 DIAGNOSIS — G8929 Other chronic pain: Secondary | ICD-10-CM

## 2020-08-13 DIAGNOSIS — Z659 Problem related to unspecified psychosocial circumstances: Secondary | ICD-10-CM

## 2020-08-13 DIAGNOSIS — F419 Anxiety disorder, unspecified: Secondary | ICD-10-CM

## 2020-08-13 NOTE — Chronic Care Management (AMB) (Signed)
Chronic Care Management    Clinical Social Work Note  08/13/2020 Name: Casey Caldwell MRN: [id number] DOB: May 12, 1980  Casey Caldwell is a 40 y.o. year old male who is a primary care patient of Tonia Ghent, MD. The CCM team was consulted to assist the patient with chronic disease management and/or care coordination needs related to: Mental Health Counseling and Resources.   Engaged with patient by telephone for follow up visit in response to provider referral for social work chronic care management and care coordination services.   Consent to Services:  The patient was given information about Chronic Care Management services, agreed to services, and gave verbal consent prior to initiation of services.  Please see initial visit note for detailed documentation.   Patient agreed to services and consent obtained.   Assessment: Review of patient past medical history, allergies, medications, and health status, including review of relevant consultants reports was performed today as part of a comprehensive evaluation and provision of chronic care management and care coordination services.     SDOH (Social Determinants of Health) assessments and interventions performed:  SDOH Interventions    Flowsheet Row Most Recent Value  SDOH Interventions   Depression Interventions/Treatment  Counseling, Medication        Advanced Directives Status: Not addressed in this encounter.  CCM Care Plan  Allergies  Allergen Reactions   Gabapentin     Intolerant, sedation    Phenergan [Promethazine Hcl]    Tape Hives    Adhesive tape   Toradol [Ketorolac Tromethamine]     "feels like I am going to crawl out of my skin"   Trileptal [Oxcarbazepine] Other (See Comments)    Profound insomnia, worsening mood.    Wellbutrin [Bupropion]     H/o SZ d/o.     Outpatient Encounter Medications as of 08/13/2020  Medication Sig   allopurinol (ZYLOPRIM) 100 MG tablet Take 1 tablet (100 mg total) by mouth daily.    ARIPiprazole (ABILIFY) 5 MG tablet Take 0.5 tablets (2.5 mg total) by mouth daily.   busPIRone (BUSPAR) 5 MG tablet Take 1 tablet (5 mg total) by mouth 2 (two) times daily.   Colchicine 0.6 MG CAPS Take 1 tablet by mouth 2 (two) times daily as needed (gout flare).   docusate sodium (COLACE) 100 MG capsule Take 1 capsule (100 mg total) by mouth 2 (two) times daily as needed for mild constipation.   escitalopram (LEXAPRO) 20 MG tablet Take 1 tablet (20 mg total) by mouth daily.   HYDROcodone-acetaminophen (NORCO/VICODIN) 5-325 MG tablet Take 1-2 tablets by mouth every 6 (six) hours as needed for severe pain (8 tabs or less per day).   HYDROmorphone (DILAUDID) 2 MG tablet Take 1 tablet (2 mg total) by mouth every 4 (four) hours as needed for severe pain (for kidney stones).   indapamide (LOZOL) 2.5 MG tablet Take 1 tablet (2.5 mg total) by mouth daily.   lisinopril (ZESTRIL) 10 MG tablet Take 1 tablet (10 mg total) by mouth daily.   loratadine (CLARITIN) 10 MG tablet Take 1 tablet (10 mg total) by mouth daily as needed for allergies.   Melatonin 10 MG TABS Take 10 mg by mouth at bedtime.   ondansetron (ZOFRAN ODT) 8 MG disintegrating tablet Take 1 tablet (8 mg total) by mouth every 8 (eight) hours as needed for nausea or vomiting.   pantoprazole (PROTONIX) 40 MG tablet Take 1 tablet (40 mg total) by mouth 2 (two) times daily.   polyethylene glycol  powder (GLYCOLAX/MIRALAX) powder MIX 17G IN 4 TO 8 OUNCES OF FLUID AND TAKE TWICE DAILY   potassium citrate (UROCIT-K) 10 MEQ (1080 MG) SR tablet Take 1 tablet (10 mEq total) by mouth 2 (two) times daily.   tamsulosin (FLOMAX) 0.4 MG CAPS capsule Take 1 capsule (0.4 mg total) by mouth daily.   zolpidem (AMBIEN) 10 MG tablet TAKE ONE TABLET BY MOUTH EVERY NIGHT AT BEDTIME AS NEEDED FOR SLEEP   No facility-administered encounter medications on file as of 08/13/2020.    Patient Active Problem List   Diagnosis Date Noted   AKI (acute kidney injury) ([place])  07/22/2020   Ureteral stone with hydronephrosis 07/22/2020   Right ureteral stone 07/21/2020   Hypertension 03/22/2020   Chronic back pain greater than 3 months duration 09/11/2019   Chronic hip pain, right 04/29/2019   Right groin pain 04/29/2019   Spinal cord stimulator status 02/28/2019   Lumbar radiculopathy 02/28/2019   Gout 01/13/2019   Rectal pain 07/15/2018   Plantar fasciitis 08/14/2017   Encounter for chronic pain management 01/28/2016   Chronic pain syndrome 10/30/2015   Hypercalciuria 05/27/2013   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   Rash 08/02/2012   Adenomatous colon polyp 06/06/2012   Nephrolithiasis 03/14/2012   Polycythemia 03/14/2012   Groin pain, chronic, left 03/14/2012   RUQ pain 03/14/2012   Insomnia 03/14/2012   Seizure disorder ([place]) 03/14/2012   Anxiety and depression 03/14/2012    Conditions to be addressed/monitored: Anxiety and Depression; Mental Health Concerns   Care Plan : LCSW Plan of Care  Updates made by Deirdre Peer, LCSW since 08/13/2020 12:00 AM     Problem: Emotional Distress   Priority: High     Long-Range Goal: [place] plan for self management of mental health needs   Start Date: 06/16/2020  Expected End Date: 10/23/2020  This Visit's Progress: Not on track  Recent Progress: On track  Priority: High  Note:   Current barriers:   Chronic Mental Health needs related to PTSD, depression/anxiety with panic disorder,  chronic pain with narcotic use Financial constraints related to unemployment, self pay-no medical insurance, Limited social support, Housing barriers, Mental Health Concerns , Family and relationship dysfunction, Lacks knowledge of community resources, and costs of RX Needs Support, Education, and Care Coordination in order to meet unmet mental health needs. Clinical Goal(s): Over the next 60 days, patient will work with SW to reduce or manage symptoms of agitation, anxiety, depression, stress, and PTSD  and increase knowledge  and/or ability of: coping skills, healthy habits, self-management skills, and stress reduction.until connected for ongoing counseling.  Clinical Interventions:  CSW made contact with pt today by phone who reports things are "decent".  Pt shared he is dealing with a kidney stone and the pain has made his chronic pain worse; and thus his depression too.  CSW completed a PHQ9 Depression screening with pt today- pt scoring a 20 (down from 22 but still severe). Pt denies any SI/HI. Pt reports taking his medications as prescribed.   Assessed patient's previous treatment, needs, coping skills, current treatment, support system and barriers to care  Other interventions: Depression screen reviewed , Solution-Focused Strategies, Mindfulness or Relaxation Training, Active listening / Reflection utilized , Emotional Supportive Provided, Psychoeducation Engineer, agricultural, Motivational Interviewing, Brief CBT , Reviewed mental health medications with patient and discussed compliance:  , Research officer, political party / information provided  , Provided basic mental health support, education  , Suicidal Ideation/Homicidal Ideation assessed:, and PHQ2/ PHQ9 completed ;  Depression  screen [facility] 2/9 08/13/2020 06/16/2020 01/16/2020 01/02/2020  Decreased Interest 1 3 0 0  Down, Depressed, Hopeless 3 3 0 0  PHQ - 2 Score 4 6 0 0  Altered sleeping 2 3 - -  Tired, decreased energy 2 2 - -  Change in appetite 3 2 - -  Feeling bad or failure about yourself  3 2 - -  Trouble concentrating 3 3 - -  Moving slowly or fidgety/restless 3 3 - -  Suicidal thoughts 0 1 - -  PHQ-9 Score 20 22 - -  Difficult doing work/chores Somewhat difficult Somewhat difficult - -  Some recent data might be hidden     Referred patient to community resources care guide team for assistance with housing resources  Provided mental health counseling with regard to his current state with PTSD, depression/anxiety and panic disorder Provided patient with  information about applying for SS disability  Discussed plans with patient for ongoing care management follow up and provided patient with direct contact information for care management team Advised patient to call 911 if crisis/emergency arises, feelings of suicidal thoughts,etc Collaborated with primary care provider re: referral to Pain Clinic Referred patient to [facility] for linking pt with a long term provider for therapy/counseling Collaboration with PCP regarding development and update of comprehensive plan of care as evidenced by provider attestation and co-signature Inter-disciplinary care team collaboration (see longitudinal plan of care) Referral placed for Pharmacy team member to assist with assessing pt's medications, costs and resources to aide in cost Patient Goals/Self-Care Activities:    - anticipate call from [place] for scheduling counseling appointment  -continue to take medications as prescribed to aide in anxiety, etc -continue to seek outlets for positive support/activities  -call the 24 hour emergency/crisis hotline if feelings arise around depression/suicide or other -call 911 in case of emergency -consider applying for social security disability -expect a call from our Care Guide and Pharmacy team members      Follow Up Plan: SW will follow up with patient by phone over the next 3-4 weeks      Eduard Clos MSW, [place] Licensed Clinical Social Worker [place] [phone]

## 2020-08-13 NOTE — Progress Notes (Signed)
Patient scheduled visit with Dr Holley Raring to discuss options with his pain.  Was last seen 04/29/19.

## 2020-08-13 NOTE — Patient Instructions (Signed)
Visit Information  PATIENT GOALS:  Goals Addressed             This Visit's Progress    To relieve panic attacks and other behavioral health symptoms       Timeframe:  Long-Range Goal Priority:  High Start Date:    06/16/2020                         Expected End Date:   10/23/2020                    Follow Up Date 09/07/2020   - anticipate call from [place] for scheduling counseling appointment  -continue to take medications as prescribed to aide in anxiety, etc -continue to seek outlets for positive support/activities  -call the 24 hour emergency/crisis hotline if feelings arise around depression/suicide or other -call 911 in case of emergency -consider applying for social security disability -expect a call from our Care Guide and Pharmacy team members    Why is this important?   Beating depression may take some time.  If you don't feel better right away, don't give up on your treatment plan.    Notes:         The patient verbalized understanding of instructions, educational materials, and care plan provided today and declined offer to receive copy of patient instructions, educational materials, and care plan.   Telephone follow up appointment with care management team member scheduled for:09/07/20 Lorrayne Ismael MSW, [place] Licensed Clinical Social Worker [place] [phone]

## 2020-08-14 ENCOUNTER — Telehealth: Payer: Self-pay | Admitting: Family Medicine

## 2020-08-14 NOTE — Telephone Encounter (Signed)
Please check with patient and see about his timeline for followup with psychiatry.  Thanks.

## 2020-08-14 NOTE — Telephone Encounter (Signed)
Spoke to patient by telephone and was advised that he just got information from our office yesterday regarding psychiatry.  Patient stated that he will be calling Monday to set up an appointment. Patient stated that he also wanted to let Dr. Damita Dunnings know that he had the stent removed from his kidney yesterday. Patient stated that he will be following-up with with [facility].

## 2020-08-16 NOTE — Telephone Encounter (Signed)
Noted. Thanks.

## 2020-08-17 ENCOUNTER — Other Ambulatory Visit: Payer: Self-pay | Admitting: Family Medicine

## 2020-08-17 ENCOUNTER — Other Ambulatory Visit: Payer: Self-pay

## 2020-08-17 ENCOUNTER — Ambulatory Visit
Payer: Self-pay | Attending: Student in an Organized Health Care Education/Training Program | Admitting: Student in an Organized Health Care Education/Training Program

## 2020-08-17 DIAGNOSIS — G894 Chronic pain syndrome: Secondary | ICD-10-CM

## 2020-08-17 DIAGNOSIS — G8929 Other chronic pain: Secondary | ICD-10-CM

## 2020-08-17 DIAGNOSIS — M25551 Pain in right hip: Secondary | ICD-10-CM

## 2020-08-17 DIAGNOSIS — R1031 Right lower quadrant pain: Secondary | ICD-10-CM

## 2020-08-17 DIAGNOSIS — Z9689 Presence of other specified functional implants: Secondary | ICD-10-CM

## 2020-08-17 MED ORDER — HYDROMORPHONE HCL 2 MG PO TABS: 2.0000 mg | ORAL_TABLET | ORAL | 0 refills | Status: DC | PRN

## 2020-08-17 MED ORDER — ZOLPIDEM TARTRATE 10 MG PO TABS: ORAL_TABLET | ORAL | 0 refills | Status: DC

## 2020-08-17 MED ORDER — HYDROCODONE-ACETAMINOPHEN 5-325 MG PO TABS: 1.0000 | ORAL_TABLET | Freq: Four times a day (QID) | ORAL | 0 refills | Status: DC | PRN

## 2020-08-17 NOTE — Progress Notes (Signed)
Patient: Casey Caldwell  Service Category: E/M  Provider: Gillis Santa, MD  DOB: 06-30-1980  DOS: 08/17/2020  Location: Office  MRN: [id number]  Setting: Ambulatory outpatient  Referring Provider: Tonia Ghent, MD  Type: Established Patient  Specialty: Interventional Pain Management  PCP: Tonia Ghent, MD  Location: Home  Delivery: TeleHealth     Virtual Encounter - Pain Management PROVIDER NOTE: Information contained herein reflects review and annotations entered in association with encounter. Interpretation of such information and data should be left to medically-trained personnel. Information provided to patient can be located elsewhere in the medical record under "Patient Instructions". Document created using STT-dictation technology, any transcriptional errors that may result from process are unintentional.    Contact & Pharmacy Preferred: [phone] Home: [phone] (home) Mobile: [phone] (mobile) E-mail: [email or web address]  [facility] [id number] - [place], [place] - [address] [address] Phone: [phone] Fax: [facility] [address] [place] [address] [place] [address] Phone: [phone] Fax: [phone]   Pre-screening  Mr. Roxy Manns offered "in-person" vs "virtual" encounter. He indicated preferring virtual for this encounter.   Reason COVID-19*  Social distancing based on CDC and AMA recommendations.   I contacted [name] on 08/17/2020 via video conference.      I clearly identified myself as Gillis Santa, MD. I verified that I was speaking with the correct person using two identifiers (Name: EASTYN DATTILO, and date of birth: 06-10-1980).  Consent I sought verbal advanced consent from [name] for virtual visit interactions. I informed Mr. Costlow of possible security and privacy concerns, risks, and limitations associated with providing  "not-in-person" medical evaluation and management services. I also informed Mr. Nadal of the availability of "in-person" appointments. Finally, I informed him that there would be a charge for the virtual visit and that he could be  personally, fully or partially, financially responsible for it. Mr. Baik expressed understanding and agreed to proceed.   Historic Elements   Mr. LAWAYNE HARTIG is a 40 y.o. year old, male patient evaluated today after our last contact on Visit date not found. Mr. Cutrone  has a past medical history of Chronic pain syndrome, Depression, GERD (gastroesophageal reflux disease), History of kidney stones, Insomnia, Kidney stones, and Seizures ([place]) (Childhood). He also  has a past surgical history that includes Lithotripsy; Appendectomy (2011); Laparoscopic cholecystectomy (1996); Tonsillectomy (1992); Wrist ganglion excision (2002); Hydrocele surgery (Left, 06/2012); Orchiectomy (Left, 11/2012); Spinal cord stimulator insertion (2015); Cholecystectomy, laparoscopic; Cystoscopy/ureteroscopy/holmium laser (Left, 06/15/2017); Cystoscopy w/ ureteral stent placement (Right, 01/27/2020); and Ureteroscopy with holmium laser lithotripsy (Right, 07/22/2020). Mr. Fanelli has a current medication list which includes the following prescription(s): allopurinol, aripiprazole, buspirone, colchicine, docusate sodium, escitalopram, indapamide, lisinopril, loratadine, melatonin, ondansetron, pantoprazole, polyethylene glycol powder, potassium citrate, tamsulosin, hydrocodone-acetaminophen, hydromorphone, and zolpidem. He  reports that he has never smoked. He has never used smokeless tobacco. He reports previous alcohol use. He reports that he does not use drugs. Mr. Sroka is allergic to gabapentin, phenergan [promethazine hcl], tape, toradol [ketorolac tromethamine], trileptal [oxcarbazepine], and wellbutrin [bupropion].   HPI  Today, he is being contacted for follow-up evaluation  Patient was last seen by me over  1 year ago.  At that time he was referred to Dr. Erlinda Hong to evaluate his hip.   Patient states that his spinal cord stimulator is bothersome and he would like to have it removed.  I have instructed him to follow-up with neurosurgery to discuss this further.  Of note he had a previous visit with [facility] have instructed him to contact them to discuss spinal cord stimulator explant.  Patient endorsed understanding.  Laboratory Chemistry Profile   Renal Lab Results  Component Value Date   BUN 11 07/22/2020   CREATININE 0.87 07/22/2020   GFR 76.06 08/05/2019   GFRAA >60 10/26/2018   GFRNONAA >60 07/22/2020    Hepatic Lab Results  Component Value Date   AST 77 (H) 07/21/2020   ALT 89 (H) 07/21/2020   ALBUMIN 4.6 07/21/2020   ALKPHOS 69 07/21/2020   LIPASE 29 07/21/2020    Electrolytes Lab Results  Component Value Date   NA 141 07/22/2020   K 4.8 07/22/2020   CL 101 07/22/2020   CALCIUM 9.6 07/22/2020    Bone No results found for: VD25OH, VD125OH2TOT, QM2103XY8, FV8867RJ7, 25OHVITD1, 25OHVITD2, 25OHVITD3, TESTOFREE, TESTOSTERONE  Inflammation (CRP: Acute Phase) (ESR: Chronic Phase) Lab Results  Component Value Date   LATICACIDVEN 0.9 07/21/2020         Note: Above Lab results reviewed.  Imaging  DG C-Arm 1-60 Min-No Report Fluoroscopy was utilized by the requesting physician.  No radiographic  interpretation.  DG CHEST PORT 1 VIEW CLINICAL DATA:  Preop ureteral stone  EXAM: PORTABLE CHEST 1 VIEW  COMPARISON:  12/31/2019  FINDINGS: Cardiomegaly. Low lung volumes. No confluent opacities, effusions or edema. No acute bony abnormality.  IMPRESSION: Cardiomegaly.  No active disease.  Electronically Signed   By: Rolm Baptise M.D.   On: 07/22/2020 00:56  Assessment  The primary encounter diagnosis was Chronic hip pain, right. Diagnoses of Right groin pain, Spinal cord stimulator status, and Chronic pain syndrome were also pertinent to this  visit.  Plan of Care   Follow-up with neurosurgery to discuss spinal cord stimulator explant  Follow-up plan:   No follow-ups on file.    Recent Visits No visits were found meeting these conditions. Showing recent visits within past 90 days and meeting all other requirements Future Appointments No visits were found meeting these conditions. Showing future appointments within next 90 days and meeting all other requirements  I discussed the assessment and treatment plan with the patient. The patient was provided an opportunity to ask questions and all were answered. The patient agreed with the plan and demonstrated an understanding of the instructions.  Patient advised to call back or seek an in-person evaluation if the symptoms or condition worsens.  Duration of encounter: 21mnutes.  Note by: BGillis Santa MD Date: 08/17/2020; Time: 3:24 PM

## 2020-09-07 ENCOUNTER — Telehealth: Payer: Self-pay | Admitting: *Deleted

## 2020-09-07 ENCOUNTER — Telehealth: Payer: Self-pay

## 2020-09-07 NOTE — Telephone Encounter (Signed)
  Care Management   Follow Up Note   09/07/2020 Name: JOHUA ESPEJEL MRN: [id number] DOB: 06-20-1980   Referred by: Tonia Ghent, MD Reason for referral : No chief complaint on file.   An unsuccessful telephone outreach was attempted today. The patient was referred to the case management team for assistance with care management and care coordination.   Follow Up Plan: Telephone follow up appointment with care management team member scheduled for:09/18/20 Eduard Clos MSW, [place] Licensed Clinical Social Worker [place] [phone]

## 2020-09-09 ENCOUNTER — Telehealth: Payer: Self-pay

## 2020-09-09 NOTE — Telephone Encounter (Signed)
Called and talked with patient.  He is currently in the ER.  I told him I was glad he went to the ER to get help.  His pain is worse-he attributed this to stimulator malfunction.  He has talked to the pain clinic and he is awaiting financial help to get his stimulator replaced.  At this point I think it makes sense to go through emergency room evaluation and go from there.  I will await his ER notes.  I thanked him for taking the call and he thanked me for calling.  I appreciate the help of all involved.

## 2020-09-09 NOTE — Telephone Encounter (Signed)
Pt called asking to speak with Dr Damita Dunnings; pt said Dr Damita Dunnings had told him if he needed to call and talk Dr Damita Dunnings would talk with pt; pt said he was having a lot of suicidal thoughts. I explained that Dr Damita Dunnings was out of the office today and pt said that was OK. I advised pt that I would be glad to talk with him but pt preferred to talk with Dr Damita Dunnings. Pt said he would hold on and I tried to contact Dr Damita Dunnings but got v/m. I explained to pt that Dr Josefine Class phone went to v/m and I asked pt if he had thought about how he might harm himself and pt said "yes, he would drive his car off a bridge and that would be the easiest way to go." I asked pt where he was right now and he said he was in his car in front of where he lives. I asked if anyone was with him and he said no he was by himself; pt lives with his wife but they are separated and she is not home now. I asked pt if he knew what triggered thoughts of harming himself and he said chronic back and groin pain. Pt said lower back and groin are very painful; pt said has been taking 12-16 Vicodin a day which pt knows that is more than is prescribed but he said even that amt of vicodin does not help his pain.pt said his pain level now is 8. I asked pt if he would go to ED to talk with someone about his pain and his SI. Pt said yes but did not want to go by short bus. I called 911 and will send officer to determine if pt needs to go somewhere and if so will dispatch EMS at that time. I again advised pt has SI and has a plan. Dispatcher said she understood but officer would go out.dispatcher could not give me a time line of when officer would arrive; she said would depend on what was going on with crime. I advised I would stay on phone with pt until someone arrives. Pt was still sitting in a black hybrid directly in front of his town house. I talked with pt until an officer arrived and pt said he was going to hang up and talk with the officer.  I spoke with Dr Damita Dunnings and  he is aware of this info and advised me to call him back if needed. Will also send to Dr Darnell Level who is aware of this note as well since he is in office.  I spoke with 911 dispatcher and she said pt was transported to [facility].

## 2020-09-10 DIAGNOSIS — F331 Major depressive disorder, recurrent, moderate: Secondary | ICD-10-CM | POA: Insufficient documentation

## 2020-09-13 ENCOUNTER — Encounter: Payer: Self-pay | Admitting: Family Medicine

## 2020-09-14 ENCOUNTER — Other Ambulatory Visit: Payer: Self-pay

## 2020-09-14 ENCOUNTER — Ambulatory Visit (INDEPENDENT_AMBULATORY_CARE_PROVIDER_SITE_OTHER): Payer: Self-pay | Admitting: Family Medicine

## 2020-09-14 ENCOUNTER — Encounter: Payer: Self-pay | Admitting: Family Medicine

## 2020-09-14 DIAGNOSIS — M5416 Radiculopathy, lumbar region: Secondary | ICD-10-CM

## 2020-09-14 MED ORDER — HYDROMORPHONE HCL 2 MG PO TABS: 2.0000 mg | ORAL_TABLET | ORAL | 0 refills | Status: DC | PRN

## 2020-09-14 MED ORDER — HYDROCODONE-ACETAMINOPHEN 5-325 MG PO TABS: 1.0000 | ORAL_TABLET | Freq: Four times a day (QID) | ORAL | 0 refills | Status: DC | PRN

## 2020-09-14 MED ORDER — ONDANSETRON 8 MG PO TBDP: 8.0000 mg | ORAL_TABLET | Freq: Three times a day (TID) | ORAL | 1 refills | Status: DC | PRN

## 2020-09-14 MED ORDER — NYSTATIN 100000 UNIT/GM EX CREA: 1.0000 "application " | TOPICAL_CREAM | Freq: Two times a day (BID) | CUTANEOUS | 1 refills | Status: DC
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
## 2020-09-14 NOTE — Patient Instructions (Addendum)
Let me check on social work help and your billing here.  Take care.  Glad to see you. I sent 1 week supply on pain meds.   We'll be in touch.   Take care.  Glad to see you.
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
## 2020-09-17 ENCOUNTER — Telehealth: Payer: Self-pay | Admitting: Family Medicine

## 2020-09-17 ENCOUNTER — Encounter: Payer: Self-pay | Admitting: Family Medicine
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
## 2020-09-17 NOTE — Telephone Encounter (Signed)
Please triage patient about vision changes and let me know.  Thanks.

## 2020-09-18 ENCOUNTER — Ambulatory Visit: Payer: Self-pay | Admitting: *Deleted

## 2020-09-18 DIAGNOSIS — G8929 Other chronic pain: Secondary | ICD-10-CM

## 2020-09-18 DIAGNOSIS — F32A Depression, unspecified: Secondary | ICD-10-CM

## 2020-09-18 DIAGNOSIS — F419 Anxiety disorder, unspecified: Secondary | ICD-10-CM

## 2020-09-18 DIAGNOSIS — Z659 Problem related to unspecified psychosocial circumstances: Secondary | ICD-10-CM
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
## 2020-09-18 NOTE — Patient Instructions (Signed)
Visit Information  PATIENT GOALS:  Goals Addressed             This Visit's Progress    To relieve panic attacks and other behavioral health symptoms       Timeframe:  Long-Range Goal Priority:  High Start Date:    06/16/2020                         Expected End Date:   10/23/2020                    Follow Up Date 10/08/2020   - call from [place] for scheduling counseling appointment  -inquire with [facility] for support with seeking work that is doable/conducive to limitations -continue to take medications as prescribed to aide in anxiety, etc -continue to seek outlets for positive support/activities  -call the 24 hour emergency/crisis hotline if feelings arise around depression/suicide or other -call 911 in case of emergency -consider applying for social security disability -expect a call from our Care Guide and Pharmacy team members   Why is this important?   Beating depression may take some time.  If you don't feel better right away, don't give up on your treatment plan.    Notes:         The patient verbalized understanding of instructions, educational materials, and care plan provided today and declined offer to receive copy of patient instructions, educational materials, and care plan.   Telephone follow up appointment with care management team member scheduled for:10/08/20  Eduard Clos MSW, [place] Licensed Clinical Social Worker [place] [phone]

## 2020-09-18 NOTE — Telephone Encounter (Signed)
Thanks.  I think it would be hard for him to do the typical bedside exercises due to his baseline back pain and this is a reasonable option in the meantime.

## 2020-09-20 ENCOUNTER — Other Ambulatory Visit: Payer: Self-pay | Admitting: Family Medicine

## 2020-09-20 ENCOUNTER — Telehealth: Payer: Self-pay | Admitting: Family Medicine
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
## 2020-09-20 NOTE — Progress Notes (Signed)
Please get update on patient and verify current med use/remaining rx so I can make plans about his next pain med rx.  Plan to rx 1 week at a time for now.  Thanks.

## 2020-09-21 NOTE — Progress Notes (Signed)
Spoke with patient and he is still about the same; Pain still the same and vision is still a little blurry far off; close up is okay. Patient takes the hydrocodone 1-2 tablets every 6 hrs. The hydromorphone he takes when needed; he takes 2-3 tablets at a time every 4 hours.

## 2020-09-21 NOTE — Telephone Encounter (Signed)
This has been printed and faxed off with patients forms.

## 2020-09-21 NOTE — Progress Notes (Signed)
LMTCB

## 2020-09-22 MED ORDER — HYDROMORPHONE HCL 2 MG PO TABS: 2.0000 mg | ORAL_TABLET | ORAL | 0 refills | Status: DC | PRN

## 2020-09-22 MED ORDER — HYDROCODONE-ACETAMINOPHEN 5-325 MG PO TABS: 1.0000 | ORAL_TABLET | Freq: Four times a day (QID) | ORAL | 0 refills | Status: DC | PRN

## 2020-09-22 NOTE — Addendum Note (Signed)
Addended by: Tonia Ghent on: 09/22/2020 08:16 AM   Modules accepted: Orders

## 2020-09-22 NOTE — Progress Notes (Signed)
Noted.  Rx sent for another week supply.  Thanks.

## 2020-09-24 ENCOUNTER — Other Ambulatory Visit: Payer: Self-pay

## 2020-09-24 ENCOUNTER — Encounter: Payer: Self-pay | Admitting: Family Medicine

## 2020-09-24 ENCOUNTER — Ambulatory Visit (INDEPENDENT_AMBULATORY_CARE_PROVIDER_SITE_OTHER): Payer: Self-pay | Admitting: Family Medicine

## 2020-09-24 VITALS — BP 124/82 | HR 94 | Temp 97.5°F | Ht 72.0 in | Wt 385.0 lb

## 2020-09-24 DIAGNOSIS — G894 Chronic pain syndrome: Secondary | ICD-10-CM

## 2020-09-24 DIAGNOSIS — R110 Nausea: Secondary | ICD-10-CM

## 2020-09-24 DIAGNOSIS — R42 Dizziness and giddiness: Secondary | ICD-10-CM

## 2020-09-24 MED ORDER — ONDANSETRON 4 MG PO TBDP
4.0000 mg | ORAL_TABLET | Freq: Once | ORAL | Status: AC
  Administered 2020-09-24: 4 mg via ORAL

## 2020-09-24 NOTE — Patient Instructions (Signed)
Keep working on neck range of motion to try to help with vertigo.  It should gradually get less severe and less frequent.   Update me as needed.
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
## 2020-09-25 ENCOUNTER — Ambulatory Visit: Payer: Self-pay | Admitting: *Deleted

## 2020-09-25 DIAGNOSIS — Z659 Problem related to unspecified psychosocial circumstances: Secondary | ICD-10-CM

## 2020-09-25 DIAGNOSIS — G8929 Other chronic pain: Secondary | ICD-10-CM

## 2020-09-25 DIAGNOSIS — F419 Anxiety disorder, unspecified: Secondary | ICD-10-CM

## 2020-09-25 DIAGNOSIS — F32A Depression, unspecified: Secondary | ICD-10-CM

## 2020-09-25 NOTE — Patient Instructions (Signed)
Visit Information  PATIENT GOALS:  Goals Addressed   None     The patient verbalized understanding of instructions, educational materials, and care plan provided today and declined offer to receive copy of patient instructions, educational materials, and care plan.   Telephone follow up appointment with care management team member scheduled for:10/08/20  Eduard Clos MSW, [place] Licensed Clinical Social Worker [place] [phone]
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
## 2020-09-28 DIAGNOSIS — R42 Dizziness and giddiness: Secondary | ICD-10-CM | POA: Insufficient documentation

## 2020-09-28 NOTE — Assessment & Plan Note (Signed)
Continue baseline medications with hydrocodone and then hydromorphone if needed for kidney stones.  He could have a kidney stone at this point.  He is trying to work with the pain clinic/insurance company to get coverage/in network provider to help with his spinal stimulator.  Continue Zofran as needed for nausea, which is likely exacerbated by pain.  He is not sedated.  Still okay for outpatient follow-up.

## 2020-09-28 NOTE — Assessment & Plan Note (Signed)
He has reproducible symptoms with neck and head rotation but not neck extension or tilting to the side.  Discussed BPV.  His symptoms are improving.  He can still try modified home vertigo exercises and then update me as needed.  Discussed that should gradually become less severe and less frequent.  He agrees with plan.

## 2020-09-29 ENCOUNTER — Other Ambulatory Visit: Payer: Self-pay | Admitting: Family Medicine

## 2020-09-29 MED ORDER — HYDROMORPHONE HCL 2 MG PO TABS: 2.0000 mg | ORAL_TABLET | ORAL | 0 refills | Status: DC | PRN

## 2020-09-29 MED ORDER — HYDROCODONE-ACETAMINOPHEN 5-325 MG PO TABS: 1.0000 | ORAL_TABLET | Freq: Four times a day (QID) | ORAL | 0 refills | Status: DC | PRN

## 2020-09-29 NOTE — Progress Notes (Signed)
1 week supply of medication sent to pharmacy.

## 2020-10-04 ENCOUNTER — Other Ambulatory Visit: Payer: Self-pay | Admitting: Family Medicine

## 2020-10-04 MED ORDER — HYDROCODONE-ACETAMINOPHEN 5-325 MG PO TABS: 1.0000 | ORAL_TABLET | Freq: Four times a day (QID) | ORAL | 0 refills | Status: DC | PRN

## 2020-10-04 MED ORDER — HYDROMORPHONE HCL 2 MG PO TABS: 2.0000 mg | ORAL_TABLET | ORAL | 0 refills | Status: DC | PRN

## 2020-10-04 NOTE — Progress Notes (Signed)
Weekly rx sent .

## 2020-10-05 ENCOUNTER — Encounter: Payer: Self-pay | Admitting: Family Medicine

## 2020-10-07 ENCOUNTER — Other Ambulatory Visit: Payer: Self-pay | Admitting: Family Medicine

## 2020-10-07 ENCOUNTER — Telehealth: Payer: Self-pay | Admitting: Family Medicine

## 2020-10-07 NOTE — Telephone Encounter (Signed)
Duly noted.  I will route this to [facility] for input.    Mandy- please talk to me about this.  Thanks.

## 2020-10-07 NOTE — Telephone Encounter (Signed)
I need clarification about who is calling and what they are requesting so I can notify staff.   Thanks.

## 2020-10-07 NOTE — Telephone Encounter (Signed)
Mrs. Casey Caldwell called in wanted to talk about some dates for the deposition for [employer]. The case is set for 01/04/21, and they want to get it schedule right away.

## 2020-10-07 NOTE — Telephone Encounter (Signed)
Sorry its Regulatory affairs officer

## 2020-10-08 ENCOUNTER — Telehealth: Payer: Self-pay

## 2020-10-09 ENCOUNTER — Telehealth: Payer: Self-pay | Admitting: *Deleted

## 2020-10-11 ENCOUNTER — Other Ambulatory Visit: Payer: Self-pay | Admitting: Family Medicine

## 2020-10-11 MED ORDER — HYDROMORPHONE HCL 2 MG PO TABS: 2.0000 mg | ORAL_TABLET | ORAL | 0 refills | Status: DC | PRN

## 2020-10-11 MED ORDER — HYDROCODONE-ACETAMINOPHEN 5-325 MG PO TABS: 1.0000 | ORAL_TABLET | Freq: Four times a day (QID) | ORAL | 0 refills | Status: DC | PRN

## 2020-10-13 ENCOUNTER — Encounter: Payer: Self-pay | Admitting: Family Medicine

## 2020-10-16 NOTE — Telephone Encounter (Addendum)
error 

## 2020-10-18 ENCOUNTER — Other Ambulatory Visit: Payer: Self-pay | Admitting: Family Medicine

## 2020-10-19 NOTE — Telephone Encounter (Signed)
Refill request colchicine Last refill update 05/05/20 Last office visit 09/24/20

## 2020-10-20 ENCOUNTER — Telehealth: Payer: Self-pay | Admitting: Family Medicine

## 2020-10-20 MED ORDER — COLCHICINE 0.6 MG PO CAPS: 1.0000 | ORAL_CAPSULE | Freq: Two times a day (BID) | ORAL | 1 refills | Status: DC | PRN

## 2020-10-20 NOTE — Telephone Encounter (Signed)
Sent. Thanks.   

## 2020-10-20 NOTE — Telephone Encounter (Signed)
Let him know that I routed this to [facility] our office manager to help establish contact.  Thanks.
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
## 2020-10-25 ENCOUNTER — Other Ambulatory Visit: Payer: Self-pay | Admitting: Family Medicine

## 2020-10-25 MED ORDER — HYDROMORPHONE HCL 2 MG PO TABS: 2.0000 mg | ORAL_TABLET | ORAL | 0 refills | Status: DC | PRN

## 2020-10-25 MED ORDER — HYDROCODONE-ACETAMINOPHEN 5-325 MG PO TABS: 1.0000 | ORAL_TABLET | Freq: Four times a day (QID) | ORAL | 0 refills | Status: DC | PRN

## 2020-10-26 ENCOUNTER — Other Ambulatory Visit: Payer: Self-pay

## 2020-10-26 ENCOUNTER — Ambulatory Visit (INDEPENDENT_AMBULATORY_CARE_PROVIDER_SITE_OTHER): Payer: Self-pay | Admitting: Family Medicine

## 2020-10-26 ENCOUNTER — Encounter: Payer: Self-pay | Admitting: Family Medicine

## 2020-10-26 VITALS — BP 110/70 | HR 110 | Temp 95.8°F | Ht 72.0 in | Wt 387.0 lb

## 2020-10-26 DIAGNOSIS — M549 Dorsalgia, unspecified: Secondary | ICD-10-CM

## 2020-10-26 DIAGNOSIS — G8929 Other chronic pain: Secondary | ICD-10-CM

## 2020-10-26 DIAGNOSIS — R110 Nausea: Secondary | ICD-10-CM

## 2020-10-26 MED ORDER — ONDANSETRON 8 MG PO TBDP: 8.0000 mg | ORAL_TABLET | Freq: Three times a day (TID) | ORAL | 1 refills | Status: DC | PRN

## 2020-10-26 MED ORDER — OXYCODONE HCL 5 MG PO TABS: 5.0000 mg | ORAL_TABLET | Freq: Four times a day (QID) | ORAL | 0 refills | Status: DC | PRN

## 2020-10-26 MED ORDER — ONDANSETRON 4 MG PO TBDP
4.0000 mg | ORAL_TABLET | Freq: Once | ORAL | Status: AC
  Administered 2020-10-26: 4 mg via ORAL

## 2020-10-26 NOTE — Patient Instructions (Addendum)
Stop vicodin and change to oxycodone.   Start with 1 tab 4 times a day and gradually increase to 1.5-2 tabs per dose if needed.  Sedation caution.  Take care.  Glad to see you. Update me in a few days.
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
## 2020-10-28 ENCOUNTER — Other Ambulatory Visit: Payer: Self-pay | Admitting: Family Medicine

## 2020-10-28 ENCOUNTER — Encounter: Payer: Self-pay | Admitting: Family Medicine

## 2020-10-28 MED ORDER — ZOLPIDEM TARTRATE 10 MG PO TABS: ORAL_TABLET | ORAL | 0 refills | Status: DC

## 2020-10-28 MED ORDER — LISINOPRIL 10 MG PO TABS: 10.0000 mg | ORAL_TABLET | Freq: Every day | ORAL | 2 refills | Status: DC

## 2020-10-28 MED ORDER — ESCITALOPRAM OXALATE 20 MG PO TABS: 20.0000 mg | ORAL_TABLET | Freq: Every day | ORAL | 3 refills | Status: DC

## 2020-10-28 MED ORDER — LORATADINE 10 MG PO TABS: 10.0000 mg | ORAL_TABLET | Freq: Every day | ORAL | 2 refills | Status: DC | PRN

## 2020-10-28 NOTE — Telephone Encounter (Signed)
Sent. Thanks.   

## 2020-10-28 NOTE — Telephone Encounter (Signed)
Refill request for Ambien 10 mg tablets  LOV - 10/26/20 Next OV - not scheduled Last refill - 08/16/20 #90/0

## 2020-10-29 ENCOUNTER — Encounter: Payer: Self-pay | Admitting: Family Medicine

## 2020-10-29 ENCOUNTER — Other Ambulatory Visit: Payer: Self-pay | Admitting: Family Medicine

## 2020-10-29 MED ORDER — HYDROCODONE-ACETAMINOPHEN 5-325 MG PO TABS: 1.0000 | ORAL_TABLET | Freq: Four times a day (QID) | ORAL | 0 refills | Status: DC | PRN

## 2020-10-29 MED ORDER — HYDROCODONE-ACETAMINOPHEN 5-325 MG PO TABS: 1.0000 | ORAL_TABLET | Freq: Four times a day (QID) | ORAL | Status: DC | PRN

## 2020-10-29 NOTE — Assessment & Plan Note (Addendum)
Discussed options. Stop vicodin and change to oxycodone.  He will see if he can tolerate oxycodone to have better pain control. He can start with 1 tab 4 times a day and gradually increase to 1.5-2 tabs per dose if needed.  Sedation caution discussed with patient. I asked him to update me in a few days, sooner if needed.  Continue Zofran as needed.  He is applying for insurance coverage to hopefully help with alternative pain clinic evaluation.

## 2020-10-29 NOTE — Telephone Encounter (Signed)
See below.  Failed change to oxycodone.  Needs to change back to hydrocodone.  Rx sent.  Please update pharmacy about the med change and have them call me if needed. Thanks.

## 2020-10-29 NOTE — Telephone Encounter (Signed)
Left message for Casey Caldwell returning his call.  I also let him know that if he is wanting patient information, we will need a signed release from the patient to disclose any patient care details.  But, if he would like to discuss further to please call me back.   Thanks.

## 2020-10-29 NOTE — Telephone Encounter (Signed)
I do see the signed records release in the chart.  I am waiting for medical records to follow up to confirm that this release will cover a phone conversation as well with the law office.

## 2020-11-03 ENCOUNTER — Telehealth: Payer: Self-pay

## 2020-11-03 ENCOUNTER — Telehealth: Payer: Self-pay | Admitting: *Deleted

## 2020-11-03 NOTE — Telephone Encounter (Signed)
  Care Management   Follow Up Note   11/03/2020 Name: Casey Caldwell MRN: [id number] DOB: 03/16/1980   Referred by: Tonia Ghent, MD Reason for referral : No chief complaint on file.   A second unsuccessful telephone outreach was attempted today. The patient was referred to the case management team for assistance with care management and care coordination.   Follow Up Plan: The care management team will reach out to the patient again over the next 7-10 days.   Eduard Clos MSW, LCSW Licensed Clinical Social Worker [place] [phone]

## 2020-11-04 ENCOUNTER — Other Ambulatory Visit: Payer: Self-pay | Admitting: Family Medicine

## 2020-11-04 MED ORDER — HYDROMORPHONE HCL 2 MG PO TABS: 2.0000 mg | ORAL_TABLET | ORAL | 0 refills | Status: DC | PRN

## 2020-11-04 MED ORDER — HYDROCODONE-ACETAMINOPHEN 5-325 MG PO TABS: 1.0000 | ORAL_TABLET | Freq: Four times a day (QID) | ORAL | 0 refills | Status: DC | PRN

## 2020-11-04 NOTE — Progress Notes (Signed)
Weekly rx sent.

## 2020-11-05 ENCOUNTER — Encounter: Payer: Self-pay | Admitting: Family Medicine

## 2020-11-05 NOTE — Telephone Encounter (Signed)
Please refer to other phone encounter addressing conversations with Javier Glazier.   Thanks.

## 2020-11-05 NOTE — Telephone Encounter (Signed)
The current medical release does NOT cover a conversation regarding patient or patients health status to be conducted between Dr. Damita Dunnings or the lawyer.   [place] law will have to initiate an official court ordered request for any discussion or release of medical record information.    Once we receive the appropriate request, we will then process following our policy measures to assist Dr. Damita Dunnings with set up of communication and what is needed.   Raymondo Band Law was made aware in message left on 10/29/20 that legal request for discussion would be needed before further communication can occur.

## 2020-11-08 ENCOUNTER — Other Ambulatory Visit: Payer: Self-pay | Admitting: Family Medicine

## 2020-11-08 DIAGNOSIS — G8929 Other chronic pain: Secondary | ICD-10-CM

## 2020-11-11 ENCOUNTER — Encounter: Payer: Self-pay | Admitting: Family Medicine

## 2020-11-12 ENCOUNTER — Ambulatory Visit: Payer: Self-pay | Admitting: Family Medicine

## 2020-11-12 ENCOUNTER — Telehealth: Payer: Self-pay | Admitting: Family Medicine

## 2020-11-12 ENCOUNTER — Other Ambulatory Visit: Payer: Self-pay | Admitting: Family Medicine

## 2020-11-12 MED ORDER — HYDROCODONE-ACETAMINOPHEN 5-325 MG PO TABS: 1.0000 | ORAL_TABLET | Freq: Four times a day (QID) | ORAL | 0 refills | Status: DC | PRN

## 2020-11-12 MED ORDER — HYDROMORPHONE HCL 2 MG PO TABS: 2.0000 mg | ORAL_TABLET | ORAL | 0 refills | Status: DC | PRN

## 2020-11-12 NOTE — Progress Notes (Signed)
R sent.

## 2020-11-13 ENCOUNTER — Telehealth: Payer: Self-pay | Admitting: *Deleted

## 2020-11-13 ENCOUNTER — Telehealth: Payer: Self-pay

## 2020-11-13 NOTE — Telephone Encounter (Signed)
Rxs were sent in yesterday. Tried to call patient but no answer and VM was full.

## 2020-11-13 NOTE — Telephone Encounter (Signed)
  Care Management   Follow Up Note   11/13/2020 Name: Casey Caldwell MRN: [id number] DOB: 07/29/1980   Referred by: Tonia Ghent, MD Reason for referral : No chief complaint on file.   Third unsuccessful telephone outreach was attempted today. The patient was referred to the case management team for assistance with care management and care coordination. The patient's primary care provider has been notified of our unsuccessful attempts to make or maintain contact with the patient. The care management team is pleased to engage with this patient at any time in the future should he/she be interested in assistance from the care management team.   Follow Up Plan: The care management team will reach out to the patient again over the next 30 days if no return call is received and close referral at that time if no success.   Eduard Clos MSW, LCSW Licensed Clinical Social Worker [place] [phone]

## 2020-11-17 ENCOUNTER — Ambulatory Visit (INDEPENDENT_AMBULATORY_CARE_PROVIDER_SITE_OTHER): Payer: 59 | Admitting: Family Medicine

## 2020-11-17 ENCOUNTER — Other Ambulatory Visit: Payer: Self-pay

## 2020-11-17 ENCOUNTER — Encounter: Payer: Self-pay | Admitting: Family Medicine

## 2020-11-17 VITALS — BP 120/84 | HR 93 | Temp 97.3°F | Ht 72.0 in | Wt 382.0 lb

## 2020-11-17 DIAGNOSIS — G8929 Other chronic pain: Secondary | ICD-10-CM

## 2020-11-17 DIAGNOSIS — M549 Dorsalgia, unspecified: Secondary | ICD-10-CM | POA: Diagnosis not present

## 2020-11-17 DIAGNOSIS — R21 Rash and other nonspecific skin eruption: Secondary | ICD-10-CM | POA: Diagnosis not present

## 2020-11-17 DIAGNOSIS — F419 Anxiety disorder, unspecified: Secondary | ICD-10-CM

## 2020-11-17 DIAGNOSIS — F32A Depression, unspecified: Secondary | ICD-10-CM

## 2020-11-17 DIAGNOSIS — Z23 Encounter for immunization: Secondary | ICD-10-CM | POA: Diagnosis not present

## 2020-11-17 MED ORDER — TRIAMCINOLONE ACETONIDE 0.5 % EX CREA: 1.0000 "application " | TOPICAL_CREAM | Freq: Two times a day (BID) | CUTANEOUS | 0 refills | Status: DC

## 2020-11-17 MED ORDER — HYDROMORPHONE HCL 2 MG PO TABS: 2.0000 mg | ORAL_TABLET | ORAL | 0 refills | Status: DC | PRN

## 2020-11-17 MED ORDER — FLUCONAZOLE 150 MG PO TABS: 150.0000 mg | ORAL_TABLET | Freq: Once | ORAL | 0 refills | Status: AC

## 2020-11-17 MED ORDER — HYDROCODONE-ACETAMINOPHEN 5-325 MG PO TABS: 1.0000 | ORAL_TABLET | Freq: Four times a day (QID) | ORAL | 0 refills | Status: DC | PRN
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
## 2020-11-17 NOTE — Patient Instructions (Signed)
I'll await your consult note.   Try TAC on a few spots.  Either way, let me know about the effect.   If positive- likely rx for steroids.   If neg, then try diflucan.  Don't take colchicine with diflucan.    We'll go from there.   Take care.  Glad to see you.

## 2020-11-18 NOTE — Assessment & Plan Note (Signed)
We talked about options.  We talked about differential diagnosis.  The initial thought was that this was a fungal infection.  He could have an atypical presentation of psoriasis.  I asked him to try triamcinolone cream on a few spots.  Either way, he will let me know about the effect.   If positive- likely rx for steroids, potentially a higher potency topical steroid or more likely oral steroids given the distribution. If neg, then try diflucan.  Advised not to take colchicine with diflucan.

## 2020-11-18 NOTE — Assessment & Plan Note (Signed)
He is going to follow-up with the pain clinic to see what options are available regarding his stimulator as that would likely help his left lower back pain and his groin pain.  He is going to check to see if there are any options available to concurrently treat the hypersensitivity/pain in his right lower back.  I wrote his pain medications for 1 month with routine cautions.  He has no suicidal or homicidal intent.  He is sleeping better and he is okay for outpatient follow-up.  Routine cautions given to patient.  He agrees with plan.  We talked about trying to get back on track with diet and exercise after he sees the pain clinic and he is going to work on that.  We can recheck here periodically.
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
## 2020-12-11 ENCOUNTER — Other Ambulatory Visit: Payer: Self-pay

## 2020-12-11 ENCOUNTER — Ambulatory Visit (INDEPENDENT_AMBULATORY_CARE_PROVIDER_SITE_OTHER): Payer: 59 | Admitting: Family Medicine

## 2020-12-11 ENCOUNTER — Encounter: Payer: Self-pay | Admitting: Family Medicine

## 2020-12-11 VITALS — BP 150/90 | HR 99 | Temp 97.0°F | Ht 72.0 in | Wt 391.0 lb

## 2020-12-11 DIAGNOSIS — G894 Chronic pain syndrome: Secondary | ICD-10-CM

## 2020-12-11 DIAGNOSIS — R6889 Other general symptoms and signs: Secondary | ICD-10-CM | POA: Diagnosis not present

## 2020-12-11 MED ORDER — OSELTAMIVIR PHOSPHATE 75 MG PO CAPS: 75.0000 mg | ORAL_CAPSULE | Freq: Two times a day (BID) | ORAL | 0 refills | Status: DC

## 2020-12-11 NOTE — Patient Instructions (Signed)
Rest and fluids.   Update me as needed, if not getting better.  If flu test is positive (notified by mychart) then start tamiflu.  Take care.  Glad to see you.

## 2020-12-11 NOTE — Progress Notes (Signed)
This visit occurred during the SARS-CoV-2 public health emergency.  Safety protocols were in place, including screening questions prior to the visit, additional usage of staff PPE, and extensive cleaning of exam room while observing appropriate contact time as indicated for disinfecting solutions.  His father in law had colon cancer surgery.  Condolences offered.  His rash is getting better.  Minimal residual on the L flank.  Improved with TAC but didn't have to use diflucan.    He is going to f/u with pain clinic about his stimulator but is in the midst of insurance review. He had initial eval with pain clinic.  D/w pt about sending for for his pain meds to be filled on the 25th on November.  Sx going on for about 2 days.   Fatigue, more than typical.  HA.  Mild cough, occ.  Diffuse aches, leg cramping.  Sleep disrupted.  No sputum.  No vomiting.  Some nausea.  No diarrhea but loose stools (unclear if that was from a vitamin supplement).  Hasn't had any testing so far.    He tolerated tamiflu in the past.    Meds, vitals, and allergies reviewed.   ROS: Per HPI unless specifically indicated in ROS section   GEN: nad, alert and oriented HEENT: mucous membranes moist, tm w/o erythema, nasal exam w/o erythema, clear discharge noted,  OP with cobblestoning, R max sinus ttp.   NECK: supple w/o LA CV: rrr.   PULM: ctab, no inc wob EXT: no edema SKIN: Well-perfused. Walking with a limp from back pain.

## 2020-12-13 ENCOUNTER — Encounter: Payer: Self-pay | Admitting: Family Medicine

## 2020-12-13 DIAGNOSIS — R6889 Other general symptoms and signs: Secondary | ICD-10-CM | POA: Insufficient documentation

## 2020-12-13 LAB — COVID-19, FLU A+B AND RSV
Influenza A, NAA: NOT DETECTED
Influenza B, NAA: NOT DETECTED
RSV, NAA: NOT DETECTED
SARS-CoV-2, NAA: NOT DETECTED

## 2020-12-13 MED ORDER — HYDROMORPHONE HCL 2 MG PO TABS: 2.0000 mg | ORAL_TABLET | ORAL | 0 refills | Status: DC | PRN

## 2020-12-13 MED ORDER — HYDROCODONE-ACETAMINOPHEN 5-325 MG PO TABS: 1.0000 | ORAL_TABLET | Freq: Four times a day (QID) | ORAL | 0 refills | Status: DC | PRN

## 2020-12-13 NOTE — Assessment & Plan Note (Signed)
He will follow with pain clinic.  See orders.  Prescription sent for hydrocodone and hydromorphone.

## 2020-12-13 NOTE — Assessment & Plan Note (Signed)
Swab done at office visit for flu and COVID.  See notes on labs.  If this is positive then it is reasonable to start Tamiflu.  If negative then no need to start.  Routine cautions given to patient.  Supportive care otherwise.  He agrees with plan.  Still okay for outpatient follow-up.  Lungs are clear.

## 2020-12-14 ENCOUNTER — Other Ambulatory Visit: Payer: Self-pay | Admitting: Family Medicine

## 2020-12-14 ENCOUNTER — Telehealth: Payer: Self-pay

## 2020-12-14 MED ORDER — DOXYCYCLINE HYCLATE 100 MG PO TABS: 100.0000 mg | ORAL_TABLET | Freq: Two times a day (BID) | ORAL | 0 refills | Status: DC

## 2020-12-21 ENCOUNTER — Ambulatory Visit: Payer: 59 | Admitting: Family Medicine

## 2020-12-21 NOTE — Telephone Encounter (Signed)
I spoke with pt;pt finished abx on 12/19/20. Pt said he may feel slightly better since taking abx, pt feels like weight on chest; difficulty breathing, and more runny nose and more chest congestion; slight head congestion also. Pt is wheezing.pt covid tested at appt and home test 1 1/2 wks ago that was neg. Flu test was also neg. Pt does not feel like he is in any distress with breathing; has felt like this for 1 wk already. If moves around symptoms worsen;and pt is sitting on couch now and the symptoms are not as bad as moving around but still has feeling of wt on chest and difficulty breathing. Pt said he has 0 in checking acct and cannot pay a copay at [facility]. Pt said he does not feel bad enough to go to ED at this time. Pt said if condition changes or worsens pt will go to ED but wants to keep appt with Dr Damita Dunnings already scheduled on 12/22/20. Pt is not running fever and pt has a home covid test and will ck that today also. Sending note to Dr Damita Dunnings and Janett Billow CMA.will teams Janett Billow also. Please call pt if Dr Damita Dunnings has other suggestions or instructions; thank you.

## 2020-12-22 ENCOUNTER — Encounter: Payer: Self-pay | Admitting: Family Medicine

## 2020-12-22 ENCOUNTER — Ambulatory Visit (INDEPENDENT_AMBULATORY_CARE_PROVIDER_SITE_OTHER): Payer: 59 | Admitting: Family Medicine

## 2020-12-22 ENCOUNTER — Other Ambulatory Visit: Payer: Self-pay

## 2020-12-22 ENCOUNTER — Ambulatory Visit (INDEPENDENT_AMBULATORY_CARE_PROVIDER_SITE_OTHER)
Admission: RE | Admit: 2020-12-22 | Discharge: 2020-12-22 | Disposition: A | Discharge status: Home / Self Care | Payer: 59 | Source: Ambulatory Visit | Attending: Family Medicine | Admitting: Family Medicine

## 2020-12-22 VITALS — BP 126/88 | HR 104 | Temp 98.3°F | Ht 72.0 in | Wt 389.0 lb

## 2020-12-22 DIAGNOSIS — R059 Cough, unspecified: Secondary | ICD-10-CM

## 2020-12-22 DIAGNOSIS — G894 Chronic pain syndrome: Secondary | ICD-10-CM

## 2020-12-22 DIAGNOSIS — J0100 Acute maxillary sinusitis, unspecified: Secondary | ICD-10-CM

## 2020-12-22 MED ORDER — PREDNISONE 10 MG PO TABS: ORAL_TABLET | ORAL | 0 refills | Status: DC

## 2020-12-22 MED ORDER — ALBUTEROL SULFATE HFA 108 (90 BASE) MCG/ACT IN AERS: 2.0000 | INHALATION_SPRAY | Freq: Four times a day (QID) | RESPIRATORY_TRACT | 1 refills | Status: DC | PRN

## 2020-12-22 MED ORDER — AMOXICILLIN-POT CLAVULANATE 875-125 MG PO TABS: 1.0000 | ORAL_TABLET | Freq: Two times a day (BID) | ORAL | 0 refills | Status: DC

## 2020-12-22 NOTE — Telephone Encounter (Signed)
See OV note.  

## 2020-12-22 NOTE — Patient Instructions (Signed)
Go to the lab on the way out.   If you have mychart we'll likely use that to update you.    Start augmentin and prednisone.  Use the inhaler if needed.  Let me know if not better.  Take care.  Glad to see you.

## 2020-12-22 NOTE — Progress Notes (Signed)
This visit occurred during the SARS-CoV-2 public health emergency.  Safety protocols were in place, including screening questions prior to the visit, additional usage of staff PPE, and extensive cleaning of exam room while observing appropriate contact time as indicated for disinfecting solutions.  Cough and congestion in chest.  Prev flu/covid testing neg.  Recent covid neg again at home.  He is some better but still with fatigue and chest tightness.  Taking mucinex and nyquil.  Some sputum, esp in the AM.  He used albuterol and it helped some.  No ADE on med.  Some wheeze. Maxillary sinuses are ttp B.  Light green sputum.   His back procedure was temporarily denied because the insurance company doesn't deal with the surgery center.  His back clinic is going to have to appeal/get an exemption- that is in process and he is awaiting that.  He is still awaiting payment from prev settlement.  His mood is better overall, d/w pt.    Meds, vitals, and allergies reviewed.   ROS: Per HPI unless specifically indicated in ROS section   GEN: nad, alert and oriented HEENT: mucous membranes moist, tm w/o erythema, nasal exam w/o erythema, clear discharge noted,  OP with cobblestoning, Maxillary sinuses are ttp B NECK: supple w/o LA CV: rrr.   PULM: ctab, no inc wob EXT: no edema SKIN: no acute rash

## 2020-12-23 ENCOUNTER — Encounter: Payer: Self-pay | Admitting: Family Medicine

## 2020-12-23 NOTE — Assessment & Plan Note (Signed)
See above.  I will await update from the back clinic about his approval for his procedure.  No change in baseline hydrocodone hydromorphone use.

## 2020-12-23 NOTE — Assessment & Plan Note (Signed)
Presumed sinusitis.  Lungs are clear.  Would still check chest x-ray given his symptoms.  See notes on imaging.  Start Augmentin and use prednisone with routine steroid cautions discussed with patient.  He understood.  Use albuterol if needed.  Routine cautions given.  See after visit summary.  Still okay for outpatient follow-up.

## 2020-12-25 ENCOUNTER — Other Ambulatory Visit: Payer: Self-pay | Admitting: Family Medicine

## 2020-12-25 ENCOUNTER — Encounter: Payer: Self-pay | Admitting: Family Medicine

## 2021-01-13 ENCOUNTER — Other Ambulatory Visit: Payer: Self-pay | Admitting: Family Medicine

## 2021-01-13 MED ORDER — HYDROMORPHONE HCL 2 MG PO TABS
2.0000 mg | ORAL_TABLET | ORAL | 0 refills | Status: DC | PRN
Start: 2021-01-13 — End: 2021-02-12

## 2021-01-13 MED ORDER — TRIAMCINOLONE ACETONIDE 0.5 % EX CREA: 1.0000 "application " | TOPICAL_CREAM | Freq: Two times a day (BID) | CUTANEOUS | 2 refills | Status: DC

## 2021-01-13 MED ORDER — ZOLPIDEM TARTRATE 10 MG PO TABS: ORAL_TABLET | ORAL | 0 refills | Status: DC

## 2021-01-13 MED ORDER — HYDROCODONE-ACETAMINOPHEN 5-325 MG PO TABS
1.0000 | ORAL_TABLET | Freq: Four times a day (QID) | ORAL | 0 refills | Status: DC | PRN
Start: 2021-01-13 — End: 2021-02-12

## 2021-01-13 NOTE — Telephone Encounter (Signed)
Refill request for Ambien 10 mg, hydromorphone 2 mg tablets and hydrocodone 5-325 mg tablets.  LOV - 12/22/20 Next OV - not scheduled Last refill - ambien 10/28/20 #90/0        Hydromorphone 12/13/20 #30/0        Hydrocodone 12/13/20 #240/0

## 2021-01-13 NOTE — Telephone Encounter (Signed)
Sent. Thanks.   

## 2021-01-24 HISTORY — PX: SPINAL CORD STIMULATOR INSERTION: SHX5378

## 2021-01-27 ENCOUNTER — Other Ambulatory Visit: Payer: Self-pay | Admitting: Family Medicine

## 2021-01-27 ENCOUNTER — Telehealth: Payer: 59 | Admitting: Student in an Organized Health Care Education/Training Program

## 2021-02-02 ENCOUNTER — Ambulatory Visit: Payer: 59 | Admitting: Family Medicine

## 2021-02-12 ENCOUNTER — Ambulatory Visit (INDEPENDENT_AMBULATORY_CARE_PROVIDER_SITE_OTHER): Payer: 59 | Admitting: Family Medicine

## 2021-02-12 ENCOUNTER — Encounter: Payer: Self-pay | Admitting: Family Medicine

## 2021-02-12 ENCOUNTER — Other Ambulatory Visit: Payer: Self-pay

## 2021-02-12 DIAGNOSIS — F419 Anxiety disorder, unspecified: Secondary | ICD-10-CM

## 2021-02-12 DIAGNOSIS — G8929 Other chronic pain: Secondary | ICD-10-CM

## 2021-02-12 DIAGNOSIS — M549 Dorsalgia, unspecified: Secondary | ICD-10-CM

## 2021-02-12 DIAGNOSIS — F32A Depression, unspecified: Secondary | ICD-10-CM | POA: Diagnosis not present

## 2021-02-12 MED ORDER — MELATONIN 10 MG PO TABS: 5.0000 mg | ORAL_TABLET | Freq: Every day | ORAL | Status: DC

## 2021-02-12 MED ORDER — HYDROCODONE-ACETAMINOPHEN 5-325 MG PO TABS: 1.0000 | ORAL_TABLET | Freq: Four times a day (QID) | ORAL | 0 refills | Status: DC | PRN

## 2021-02-12 MED ORDER — HYDROMORPHONE HCL 2 MG PO TABS: 2.0000 mg | ORAL_TABLET | ORAL | 0 refills | Status: DC | PRN
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
## 2021-02-12 NOTE — Patient Instructions (Addendum)
Let me know what you hear about the urology and pain clinic follow up.   Try cutting back on the melatonin and try to keep a regular sleep schedule.  Take care.  Glad to see you.

## 2021-02-14 NOTE — Assessment & Plan Note (Signed)
He is going to follow-up with the pain clinic to see what options are available for treatment.  He is not sedated.  He is using hydrocodone at baseline with hydromorphone as needed for breakthrough pain or renal stones.  He feels like he is trying to pass a stone with groin pain reminiscent of previous renal stones.  Prescription sent.  Compliant.  Okay for outpatient follow-up.  He agrees with plan.
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
## 2021-02-15 ENCOUNTER — Ambulatory Visit: Payer: 59 | Admitting: Urology

## 2021-02-15 DIAGNOSIS — R1011 Right upper quadrant pain: Secondary | ICD-10-CM

## 2021-02-25 ENCOUNTER — Encounter: Payer: Self-pay | Admitting: Family Medicine

## 2021-02-25 ENCOUNTER — Telehealth (INDEPENDENT_AMBULATORY_CARE_PROVIDER_SITE_OTHER): Payer: 59 | Admitting: Family Medicine

## 2021-02-25 ENCOUNTER — Other Ambulatory Visit: Payer: Self-pay

## 2021-02-25 DIAGNOSIS — M549 Dorsalgia, unspecified: Secondary | ICD-10-CM | POA: Diagnosis not present

## 2021-02-25 DIAGNOSIS — G8929 Other chronic pain: Secondary | ICD-10-CM | POA: Diagnosis not present
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
## 2021-02-28 NOTE — Assessment & Plan Note (Signed)
°  We talked about his back pain.  It appears that he is not going to be able to drive significant distances with his current situation/pain level. When he gets back to [facility] he is going to look into water based exercise at the [facility] to limit weightbearing and strain on his back. He can limit driving to short distances.   He will restart his baseline pain medications upon return  He is working on his diet.   He'll check on follow-up with the clinic in [place] about his stimulator.  He will update me as needed. Sedation caution on medications discussed.

## 2021-03-03 ENCOUNTER — Encounter: Payer: Self-pay | Admitting: Family Medicine

## 2021-03-05 ENCOUNTER — Encounter: Payer: Self-pay | Admitting: Urology

## 2021-03-05 ENCOUNTER — Other Ambulatory Visit: Payer: Self-pay

## 2021-03-05 ENCOUNTER — Ambulatory Visit (INDEPENDENT_AMBULATORY_CARE_PROVIDER_SITE_OTHER): Payer: 59 | Admitting: Urology

## 2021-03-05 VITALS — BP 131/85 | HR 111 | Wt 386.0 lb

## 2021-03-05 DIAGNOSIS — N200 Calculus of kidney: Secondary | ICD-10-CM | POA: Diagnosis not present

## 2021-03-05 LAB — MICROSCOPIC EXAMINATION
Bacteria, UA: NONE SEEN
Epithelial Cells (non renal): NONE SEEN /hpf (ref 0–10)
RBC, Urine: NONE SEEN /hpf (ref 0–2)
Renal Epithel, UA: NONE SEEN /hpf
WBC, UA: NONE SEEN /hpf (ref 0–5)

## 2021-03-05 LAB — URINALYSIS, ROUTINE W REFLEX MICROSCOPIC
Bilirubin, UA: NEGATIVE
Glucose, UA: NEGATIVE
Ketones, UA: NEGATIVE
Leukocytes,UA: NEGATIVE
Nitrite, UA: NEGATIVE
RBC, UA: NEGATIVE
Specific Gravity, UA: 1.02 (ref 1.005–1.030)
Urobilinogen, Ur: 1 mg/dL (ref 0.2–1.0)
pH, UA: 7 (ref 5.0–7.5)

## 2021-03-05 MED ORDER — POTASSIUM CITRATE ER 15 MEQ (1620 MG) PO TBCR: 1.0000 | EXTENDED_RELEASE_TABLET | Freq: Two times a day (BID) | ORAL | 3 refills | Status: DC

## 2021-03-05 MED ORDER — ALLOPURINOL 300 MG PO TABS: 300.0000 mg | ORAL_TABLET | Freq: Every day | ORAL | 11 refills | Status: DC

## 2021-03-05 NOTE — Patient Instructions (Signed)
Dietary Guidelines to Help Prevent Kidney Stones Kidney stones are deposits of minerals and salts that form inside your kidneys. Your risk of developing kidney stones may be greater depending on your diet, your lifestyle, the medicines you take, and whether you have certain medical conditions. Most people can lower their chances of developing kidney stones by following the instructions below. Your dietitian may give you more specific instructions depending on your overall health and the type of kidney stones you tend to develop. What are tips for following this plan? Reading food labels  Choose foods with "no salt added" or "low-salt" labels. Limit your salt (sodium) intake to less than 1,500 mg a day. Choose foods with calcium for each meal and snack. Try to eat about 300 mg of calcium at each meal. Foods that contain 200-500 mg of calcium a serving include: 8 oz (237 mL) of milk, calcium-fortifiednon-dairy milk, and calcium-fortifiedfruit juice. Calcium-fortified means that calcium has been added to these drinks. 8 oz (237 mL) of kefir, yogurt, and soy yogurt. 4 oz (114 g) of tofu. 1 oz (28 g) of cheese. 1 cup (150 g) of dried figs. 1 cup (91 g) of cooked broccoli. One 3 oz (85 g) can of sardines or mackerel. Most people need 1,000-1,500 mg of calcium a day. Talk to your dietitian about how much calcium is recommended for you. Shopping Buy plenty of fresh fruits and vegetables. Most people do not need to avoid fruits and vegetables, even if these foods contain nutrients that may contribute to kidney stones. When shopping for convenience foods, choose: Whole pieces of fruit. Pre-made salads with dressing on the side. Low-fat fruit and yogurt smoothies. Avoid buying frozen meals or prepared deli foods. These can be high in sodium. Look for foods with live cultures, such as yogurt and kefir. Choose high-fiber grains, such as whole-wheat breads, oat bran, and wheat cereals. Cooking Do not add  salt to food when cooking. Place a salt shaker on the table and allow each person to add his or her own salt to taste. Use vegetable protein, such as beans, textured vegetable protein (TVP), or tofu, instead of meat in pasta, casseroles, and soups. Meal planning Eat less salt, if told by your dietitian. To do this: Avoid eating processed or pre-made food. Avoid eating fast food. Eat less animal protein, including cheese, meat, poultry, or fish, if told by your dietitian. To do this: Limit the number of times you have meat, poultry, fish, or cheese each week. Eat a diet free of meat at least 2 days a week. Eat only one serving each day of meat, poultry, fish, or seafood. When you prepare animal protein, cut pieces into small portion sizes. For most meat and fish, one serving is about the size of the palm of your hand. Eat at least five servings of fresh fruits and vegetables each day. To do this: Keep fruits and vegetables on hand for snacks. Eat one piece of fruit or a handful of berries with breakfast. Have a salad and fruit at lunch. Have two kinds of vegetables at dinner. Limit foods that are high in a substance called oxalate. These include: Spinach (cooked), rhubarb, beets, sweet potatoes, and Swiss chard. Peanuts. Potato chips, french fries, and baked potatoes with skin on. Nuts and nut products. Chocolate. If you regularly take a diuretic medicine, make sure to eat at least 1 or 2 servings of fruits or vegetables that are high in potassium each day. These include: Avocado. Banana. Orange, prune,   carrot, or tomato juice. Baked potato. Cabbage. Beans and split peas. Lifestyle  Drink enough fluid to keep your urine pale yellow. This is the most important thing you can do. Spread your fluid intake throughout the day. If you drink alcohol: Limit how much you use to: 0-1 drink a day for women who are not pregnant. 0-2 drinks a day for men. Be aware of how much alcohol is in your  drink. In the [place] one drink equals one 12 oz bottle of beer (355 mL), one 5 oz glass of wine (148 mL), or one 1 oz glass of hard liquor (44 mL). Lose weight if told by your health care provider. Work with your dietitian to find an eating plan and weight loss strategies that work best for you. General information Talk to your health care provider and dietitian about taking daily supplements. You may be told the following depending on your health and the cause of your kidney stones: Not to take supplements with vitamin C. To take a calcium supplement. To take a daily probiotic supplement. To take other supplements such as magnesium, fish oil, or vitamin B6. Take over-the-counter and prescription medicines only as told by your health care provider. These include supplements. What foods should I limit? Limit your intake of the following foods, or eat them as told by your dietitian. Vegetables Spinach. Rhubarb. Beets. Canned vegetables. Pickles. Olives. Baked potatoes with skin. Grains Wheat bran. Baked goods. Salted crackers. Cereals high in sugar. Meats and other proteins Nuts. Nut butters. Large portions of meat, poultry, or fish. Salted, precooked, or cured meats, such as sausages, meat loaves, and hot dogs. Dairy Cheese. Beverages Regular soft drinks. Regular vegetable juice. Seasonings and condiments Seasoning blends with salt. Salad dressings. Soy sauce. Ketchup. Barbecue sauce. Other foods Canned soups. Canned pasta sauce. Casseroles. Pizza. Lasagna. Frozen meals. Potato chips. French fries. The items listed above may not be a complete list of foods and beverages you should limit. Contact a dietitian for more information. What foods should I avoid? Talk to your dietitian about specific foods you should avoid based on the type of kidney stones you have and your overall health. Fruits Grapefruit. The item listed above may not be a complete list of foods and beverages you should  avoid. Contact a dietitian for more information. Summary Kidney stones are deposits of minerals and salts that form inside your kidneys. You can lower your risk of kidney stones by making changes to your diet. The most important thing you can do is drink enough fluid. Drink enough fluid to keep your urine pale yellow. Talk to your dietitian about how much calcium you should have each day, and eat less salt and animal protein as told by your dietitian. This information is not intended to replace advice given to you by your health care provider. Make sure you discuss any questions you have with your health care provider. Document Revised: 01/03/2019 Document Reviewed: 01/03/2019 Elsevier Patient Education  2022 [employer]  

## 2021-03-05 NOTE — Progress Notes (Signed)
03/05/2021 11:00 AM   Casey Caldwell [birth year] [id number]  Referring provider: Tonia Ghent, MD [place],  [place] [address]  nephrolithiasis   HPI: [name] is a 41yo here for evaluation of nephrolithiasis. He has had over 20 ESWLs in the past. He underwent ureteroscopy in 06/2020. He has dull to sharp intermittent right flank pain. No worsening LUTS. He does have intermittent gross hematuria related to activity. He is on allopurinol 100mg  daily and potassium. No recent imaging.   PMH: Past Medical History:  Diagnosis Date   Chronic pain syndrome    Depression    and panic/anxiety   GERD (gastroesophageal reflux disease)    History of kidney stones    Insomnia    Kidney stones    Seizures ([place]) Childhood    Surgical History: Past Surgical History:  Procedure Laterality Date   APPENDECTOMY  2011   CHOLECYSTECTOMY, LAPAROSCOPIC     CYSTOSCOPY W/ URETERAL STENT PLACEMENT Right 01/27/2020   Procedure: CYSTOSCOPY WITH RETROGRADE PYELOGRAM/URETERAL STENT PLACEMENT;  Surgeon: Casey Mons, MD;  Location: [facility];  Service: Urology;  Laterality: Right;   CYSTOSCOPY/URETEROSCOPY/HOLMIUM LASER Left 06/15/2017   Procedure: CYSTOSCOPY/LEFT URETEROSCOPYSTONE EXTRACTION/LEFT RETROGRADE;  Surgeon: Casey Gallo, MD;  Location: [facility];  Service: Urology;  Laterality: Left;  With STENT   HYDROCELE EXCISION Left 06/2012   LAPAROSCOPIC CHOLECYSTECTOMY  1996   LITHOTRIPSY     four times over the years; most recent 10/08/12   ORCHIECTOMY Left 11/2012   SPINAL CORD STIMULATOR INSERTION  2015   TONSILLECTOMY  1992   URETEROSCOPY WITH HOLMIUM LASER LITHOTRIPSY Right 07/22/2020   Procedure: CYSTOSCOPY, RIGHT RETROGRADE PYLEOGRAM, RIGHT URETEROSCOPY WITH HOLMIUM LASER LITHOTRIPSY, RIGHT URETERAL STENT;  Surgeon: Casey Frock, MD;  Location: [facility];  Service: Urology;  Laterality: Right;   WRIST GANGLION EXCISION  2002   Right    Home Medications:   Allergies as of 03/05/2021       Reactions   Gabapentin    Intolerant, sedation    Oxycodone    Intolerant but tolerated vicodin.    Phenergan [promethazine Hcl]    Tape Hives   Adhesive tape   Toradol [ketorolac Tromethamine]    "feels like I am going to crawl out of my skin"   Trileptal [oxcarbazepine] Other (See Comments)   Profound insomnia, worsening mood.    Wellbutrin [bupropion]    H/o SZ d/o.         Medication List        Accurate as of March 05, 2021 11:00 AM. If you have any questions, ask your nurse or doctor.          STOP taking these medications    allopurinol 100 MG tablet Commonly known as: ZYLOPRIM Stopped by: Casey Bang, MD       TAKE these medications    ARIPiprazole 5 MG tablet Commonly known as: ABILIFY TAKE 1/2 TABLET BY MOUTH DAILY   busPIRone 5 MG tablet Commonly known as: BUSPAR TAKE ONE TABLET BY MOUTH TWICE A DAY   colchicine 0.6 MG tablet TAKE ONE TABLET BY MOUTH TWICE A DAY AS NEEDED FOR GOUT FLARE   docusate sodium 100 MG capsule Commonly known as: COLACE Take 1 capsule (100 mg total) by mouth 2 (two) times daily as needed for mild constipation.   escitalopram 20 MG tablet Commonly known as: LEXAPRO Take 1 tablet (20 mg total) by mouth daily.   HYDROcodone-acetaminophen 5-325 MG tablet Commonly known as:  NORCO/VICODIN Take 1-2 tablets by mouth every 6 (six) hours as needed for severe pain (8 tabs or less per day).   HYDROmorphone 2 MG tablet Commonly known as: DILAUDID Take 1 tablet (2 mg total) by mouth every 4 (four) hours as needed for severe pain (for kidney stones or breakthrough pain).   indapamide 2.5 MG tablet Commonly known as: LOZOL TAKE ONE TABLET BY MOUTH DAILY   lisinopril 10 MG tablet Commonly known as: ZESTRIL Take 1 tablet (10 mg total) by mouth daily.   loratadine 10 MG tablet Commonly known as: CLARITIN Take 1 tablet (10 mg total) by mouth daily as needed for allergies.    Melatonin 10 MG Tabs Take 5-10 mg by mouth at bedtime.   nystatin cream Commonly known as: MYCOSTATIN Apply 1 application topically 2 (two) times daily. Use until rash is resolved and then for 2 more days.   ondansetron 8 MG disintegrating tablet Commonly known as: Zofran ODT Take 1 tablet (8 mg total) by mouth every 8 (eight) hours as needed for nausea or vomiting.   pantoprazole 40 MG tablet Commonly known as: PROTONIX Take 1 tablet (40 mg total) by mouth 2 (two) times daily.   polyethylene glycol powder 17 GM/SCOOP powder Commonly known as: GLYCOLAX/MIRALAX MIX 17G IN 4 TO 8 OUNCES OF FLUID AND TAKE TWICE DAILY   potassium citrate 10 MEQ (1080 MG) SR tablet Commonly known as: UROCIT-K Take 1 tablet (10 mEq total) by mouth 2 (two) times daily.   tamsulosin 0.4 MG Caps capsule Commonly known as: FLOMAX Take 1 capsule (0.4 mg total) by mouth daily.   triamcinolone cream 0.5 % Commonly known as: KENALOG Apply 1 application topically 2 (two) times daily.   zolpidem 10 MG tablet Commonly known as: AMBIEN TAKE ONE TABLET BY MOUTH EVERY NIGHT AT BEDTIME AS NEEDED FOR SLEEP        Allergies:  Allergies  Allergen Reactions   Gabapentin     Intolerant, sedation    Oxycodone     Intolerant but tolerated vicodin.    Phenergan [Promethazine Hcl]    Tape Hives    Adhesive tape   Toradol [Ketorolac Tromethamine]     "feels like I am going to crawl out of my skin"   Trileptal [Oxcarbazepine] Other (See Comments)    Profound insomnia, worsening mood.    Wellbutrin [Bupropion]     H/o SZ d/o.     Family History: Family History  Problem Relation Age of Onset   Stroke Mother    Liver disease Mother    Irritable bowel syndrome Mother    Kidney disease Mother    Heart disease Father    Colon polyps Father    Colon cancer Neg Hx     Social History:  reports that he has never smoked. He has never used smokeless tobacco. He reports that he does not currently use  alcohol. He reports that he does not use drugs.  ROS: All other review of systems were reviewed and are negative except what is noted above in HPI  Physical Exam: BP 131/85    Pulse (!) 111    Wt (!) 386 lb (175.1 kg)    BMI 52.35 kg/m   Constitutional:  Alert and oriented, No acute distress. HEENT: [place] AT, moist mucus membranes.  Trachea midline, no masses. Cardiovascular: No clubbing, cyanosis, or edema. Respiratory: Normal respiratory effort, no increased work of breathing. GI: Abdomen is soft, nontender, nondistended, no abdominal masses GU: No CVA tenderness.  Lymph: No cervical or inguinal lymphadenopathy. Skin: No rashes, bruises or suspicious lesions. Neurologic: Grossly intact, no focal deficits, moving all 4 extremities. Psychiatric: Normal mood and affect.  Laboratory Data: Lab Results  Component Value Date   WBC 10.2 07/22/2020   HGB 18.0 (H) 07/22/2020   HCT 53.5 (H) 07/22/2020   MCV 91.3 07/22/2020   PLT 287 07/22/2020    Lab Results  Component Value Date   CREATININE 0.87 07/22/2020    No results found for: PSA  No results found for: TESTOSTERONE  No results found for: HGBA1C  Urinalysis    Component Value Date/Time   COLORURINE YELLOW 07/21/2020 1620   APPEARANCEUR CLEAR 07/21/2020 1620   LABSPEC 1.015 07/21/2020 1620   PHURINE 7.0 07/21/2020 1620   GLUCOSEU NEGATIVE 07/21/2020 1620   HGBUR NEGATIVE 07/21/2020 1620   BILIRUBINUR NEGATIVE 07/21/2020 1620   KETONESUR NEGATIVE 07/21/2020 1620   PROTEINUR NEGATIVE 07/21/2020 1620   UROBILINOGEN 1.0 10/20/2014 1820   NITRITE NEGATIVE 07/21/2020 1620   LEUKOCYTESUR NEGATIVE 07/21/2020 1620    Lab Results  Component Value Date   BACTERIA NONE SEEN 01/27/2020    Pertinent Imaging:  Results for orders placed during the hospital encounter of 01/27/20  DG Abdomen 1 View  Narrative CLINICAL DATA:  Nephrolithiasis  EXAM: ABDOMEN - 1 VIEW  COMPARISON:  01/21/2020, CT  01/08/2020  FINDINGS: The previously noted right paraspinal calculus is not as well visualized on the current examination though the region is in part obscured by dorsal column stimulator battery pack. No additional nephro or urolithiasis identified. Normal abdominal gas pattern. Multiple phleboliths within the pelvis. Dorsal column stimulator lead and cholecystectomy clips again noted.  IMPRESSION: No definite nephro or urolithiasis identified though the right paraspinal region is partially obscured by dorsal column stimulator device.   Electronically Signed By: Fidela Salisbury MD On: 01/27/2020 03:34  No results found for this or any previous visit.  No results found for this or any previous visit.  No results found for this or any previous visit.  Results for orders placed during the hospital encounter of 07/21/20  US Renal  Narrative CLINICAL DATA:  42 year old male with right ureteral calculus. Worsening abdominal pain.  EXAM: RENAL / URINARY TRACT ULTRASOUND COMPLETE  COMPARISON:  CT abdomen pelvis dated 07/18/2020.  FINDINGS: Evaluation is limited due to body habitus.  Right Kidney:  Renal measurements: 11.9 x 6.1 x 5.1 cm = volume: 196 mL. Normal echogenicity. There is mild parenchyma atrophy. There is moderate hydronephrosis. No stone identified within the right kidney.  Left Kidney:  Renal measurements: 12.0 x 6.5 x 5.5 cm = volume: 225 mL. Normal echogenicity. Mild parenchyma atrophy. No hydronephrosis or shadowing stone.  Bladder:  The urinary bladder is mildly distended. The left ureteral jet noted. The right ureteral jet is not visualized.  Other:  None.  IMPRESSION: Moderate right hydronephrosis with absent right ureteral jets. Findings concerning for an obstructing right ureteral calculus.   Electronically Signed By: Anner Crete M.D. On: 07/21/2020 19:13  Results for orders placed during the hospital encounter of  11/15/16  [place] SCROTOM DOPPLER  Narrative CLINICAL DATA:  Groin pain history of left orchiectomy  EXAM: SCROTAL ULTRASOUND  DOPPLER ULTRASOUND OF THE TESTICLES  TECHNIQUE: Complete ultrasound examination of the testicles, epididymis, and other scrotal structures was performed. Color and spectral Doppler ultrasound were also utilized to evaluate blood flow to the testicles.  COMPARISON:  01/26/2016  FINDINGS: Right testicle  Measurements: 4.6 x 2.3 x 3.1  cm. No mass or microlithiasis visualized.  Left testicle  Surgically absent  Right epididymis:  Normal in size and appearance.  Hydrocele:  None visualized.  Varicocele:  None visualized.  Pulsed Doppler interrogation of both testes demonstrates normal low resistance arterial and venous waveforms bilaterally.  IMPRESSION: 1. Status post left orchiectomy. 2. Normal ultrasound appearance of the right testis with no evidence for torsion or mass   Electronically Signed By: Donavan Foil M.D. On: 11/15/2016 18:35  No results found for this or any previous visit.  Results for orders placed during the hospital encounter of 07/18/20  CT Renal Stone Study  Narrative CLINICAL DATA:  Right flank pain.  EXAM: CT ABDOMEN AND PELVIS WITHOUT CONTRAST  TECHNIQUE: Multidetector CT imaging of the abdomen and pelvis was performed following the standard protocol without IV contrast.  COMPARISON:  January 27, 2020  FINDINGS: Lower chest: No acute abnormality.  Hepatobiliary: There is diffuse fatty infiltration of the liver parenchyma. No focal liver abnormality is seen. Status post cholecystectomy. No biliary dilatation.  Pancreas: Unremarkable. No pancreatic ductal dilatation or surrounding inflammatory changes.  Spleen: Normal in size without focal abnormality.  Adrenals/Urinary Tract: Adrenal glands are unremarkable. Kidneys are normal in size, without focal lesions. Multiple 2 mm and 3 mm nonobstructing  renal stones are seen within the left kidney. An additional 2 mm nonobstructing renal stone is noted within the mid to lower right kidney. A 7 mm obstructing renal stone is noted within the proximal right ureter. Moderate severity right-sided hydronephrosis and proximal hydroureter are noted. Bladder is unremarkable.  Stomach/Bowel: Stomach is within normal limits. The appendix is surgically absent. No evidence of bowel wall thickening, distention, or inflammatory changes.  Vascular/Lymphatic: No significant vascular findings are present. No enlarged abdominal or pelvic lymph nodes.  Reproductive: Prostate is unremarkable.  Other: No abdominal wall hernia or abnormality. No abdominopelvic ascites.  Musculoskeletal: A spinal stimulator device is in place. No acute or significant osseous findings.  IMPRESSION: 1. 7 mm obstructing renal stone within the proximal right ureter. 2. Bilateral subcentimeter nonobstructing renal calculi. 3. Evidence of prior cholecystectomy.   Electronically Signed By: Virgina Norfolk M.D. On: 07/18/2020 23:13   Assessment & Plan:    1. Nephrolithiasis -Continue urocitK 11meq BID -Increase allopurinol 300mg  daily -RTC 6 months with KUB and renal US   No follow-ups on file.  Casey Bang, MD  [facility] [place]

## 2021-03-19 ENCOUNTER — Other Ambulatory Visit: Payer: Self-pay | Admitting: Family Medicine

## 2021-03-21 NOTE — Telephone Encounter (Signed)
Dilaudid last filled 02-12-21 #30 Hydrocodone last filled 02-15-21 #240 Zolpidem last filled 01-13-21 #90 Last OV 02-25-21 No Future OV No CSA on file Last UDS 09-09-20

## 2021-03-22 ENCOUNTER — Encounter: Payer: Self-pay | Admitting: Family Medicine

## 2021-03-22 MED ORDER — HYDROMORPHONE HCL 2 MG PO TABS: 2.0000 mg | ORAL_TABLET | ORAL | 0 refills | Status: DC | PRN

## 2021-03-22 MED ORDER — HYDROCODONE-ACETAMINOPHEN 5-325 MG PO TABS: 1.0000 | ORAL_TABLET | Freq: Four times a day (QID) | ORAL | 0 refills | Status: DC | PRN

## 2021-03-22 MED ORDER — ZOLPIDEM TARTRATE 10 MG PO TABS: ORAL_TABLET | ORAL | 0 refills | Status: DC

## 2021-03-22 NOTE — Telephone Encounter (Signed)
Okay to continue, rxs sent.  Thanks.

## 2021-03-23 ENCOUNTER — Telehealth: Payer: Self-pay | Admitting: Family Medicine

## 2021-03-23 NOTE — Telephone Encounter (Signed)
Please check with patient and pharmacy about ambien refill.  Please see mychart message.  Thanks.

## 2021-03-23 NOTE — Telephone Encounter (Signed)
Sent patient mychart message about getting rx refilled. He will call if there are issues with getting the rx.

## 2021-04-01 ENCOUNTER — Encounter: Payer: Self-pay | Admitting: Family Medicine

## 2021-04-02 NOTE — Telephone Encounter (Signed)
Pt called in for virtual appt and Cindy at front desk transferred pt to me. Pt said on 03/31/21 he fell down 7 steps after his back gave way with him. Pt said he did lose consciousness. Pt has continuous dull lower back pain on lt and rt side that at different times without warning becomes a sharp pain. Pt said the pain  does radiate down lt leg to knee. I advised pt needed an in person appt for evaluation and there were no available appts at [facility] today. Pt may need imaging since lost consciousness and also since having continuous back pain. Pt voiced understanding and will go to [facility] ED. Sending note to Dr Damita Dunnings and Janett Billow CMA. ?

## 2021-04-03 ENCOUNTER — Emergency Department (HOSPITAL_COMMUNITY)
Admission: EM | Admit: 2021-04-03 | Discharge: 2021-04-03 | Disposition: A | Discharge status: Home / Self Care | Payer: 59 | Attending: Emergency Medicine | Admitting: Emergency Medicine

## 2021-04-03 ENCOUNTER — Emergency Department (HOSPITAL_COMMUNITY): Payer: 59

## 2021-04-03 ENCOUNTER — Encounter (HOSPITAL_COMMUNITY): Payer: Self-pay

## 2021-04-03 DIAGNOSIS — R109 Unspecified abdominal pain: Secondary | ICD-10-CM | POA: Diagnosis present

## 2021-04-03 DIAGNOSIS — R110 Nausea: Secondary | ICD-10-CM | POA: Diagnosis not present

## 2021-04-03 DIAGNOSIS — W109XXA Fall (on) (from) unspecified stairs and steps, initial encounter: Secondary | ICD-10-CM | POA: Diagnosis not present

## 2021-04-03 DIAGNOSIS — R1084 Generalized abdominal pain: Secondary | ICD-10-CM | POA: Insufficient documentation

## 2021-04-03 DIAGNOSIS — M545 Low back pain, unspecified: Secondary | ICD-10-CM | POA: Diagnosis not present

## 2021-04-03 DIAGNOSIS — R52 Pain, unspecified: Secondary | ICD-10-CM

## 2021-04-03 DIAGNOSIS — N50811 Right testicular pain: Secondary | ICD-10-CM | POA: Diagnosis not present

## 2021-04-03 DIAGNOSIS — W19XXXA Unspecified fall, initial encounter: Secondary | ICD-10-CM

## 2021-04-03 LAB — COMPREHENSIVE METABOLIC PANEL
ALT: 102 U/L — ABNORMAL HIGH (ref 0–44)
AST: 92 U/L — ABNORMAL HIGH (ref 15–41)
Albumin: 4.3 g/dL (ref 3.5–5.0)
Alkaline Phosphatase: 68 U/L (ref 38–126)
Anion gap: 6 (ref 5–15)
BUN: 14 mg/dL (ref 6–20)
CO2: 27 mmol/L (ref 22–32)
Calcium: 9 mg/dL (ref 8.9–10.3)
Chloride: 105 mmol/L (ref 98–111)
Creatinine, Ser: 0.82 mg/dL (ref 0.61–1.24)
GFR, Estimated: >60 mL/min (ref >60)
Glucose, Bld: 100 mg/dL — ABNORMAL HIGH (ref 70–99)
Potassium: 3.9 mmol/L (ref 3.5–5.1)
Sodium: 138 mmol/L (ref 135–145)
Total Bilirubin: 0.8 mg/dL (ref 0.3–1.2)
Total Protein: 7.3 g/dL (ref 6.5–8.1)

## 2021-04-03 LAB — CBC WITH DIFFERENTIAL/PLATELET
Abs Immature Granulocytes: 0.05 10*3/uL (ref 0.00–0.07)
Basophils Absolute: 0.1 10*3/uL (ref 0.0–0.1)
Basophils Relative: 1 %
Eosinophils Absolute: 0.5 10*3/uL (ref 0.0–0.5)
Eosinophils Relative: 5 %
HCT: 49.6 % (ref 39.0–52.0)
Hemoglobin: 17.2 g/dL — ABNORMAL HIGH (ref 13.0–17.0)
Immature Granulocytes: 1 %
Lymphocytes Relative: 27 %
Lymphs Abs: 2.8 10*3/uL (ref 0.7–4.0)
MCH: 31 pg (ref 26.0–34.0)
MCHC: 34.7 g/dL (ref 30.0–36.0)
MCV: 89.5 fL (ref 80.0–100.0)
Monocytes Absolute: 1.2 10*3/uL — ABNORMAL HIGH (ref 0.1–1.0)
Monocytes Relative: 11 %
Neutro Abs: 6 10*3/uL (ref 1.7–7.7)
Neutrophils Relative %: 55 %
Platelets: 288 10*3/uL (ref 150–400)
RBC: 5.54 MIL/uL (ref 4.22–5.81)
RDW: 13.6 % (ref 11.5–15.5)
WBC: 10.6 10*3/uL — ABNORMAL HIGH (ref 4.0–10.5)
nRBC: 0 % (ref 0.0–0.2)

## 2021-04-03 LAB — LACTIC ACID, PLASMA: Lactic Acid, Venous: 0.9 mmol/L (ref 0.5–1.9)

## 2021-04-03 MED ORDER — HYDROMORPHONE HCL 1 MG/ML IJ SOLN
1.0000 mg | Freq: Once | INTRAMUSCULAR | Status: AC
  Administered 2021-04-03: 1 mg via INTRAVENOUS
  Filled 2021-04-03: qty 1

## 2021-04-03 MED ORDER — ONDANSETRON 4 MG PO TBDP: 4.0000 mg | ORAL_TABLET | ORAL | 0 refills | Status: DC | PRN

## 2021-04-03 MED ORDER — SODIUM CHLORIDE 0.9 % IV SOLN: Freq: Once | INTRAVENOUS | Status: AC

## 2021-04-03 MED ORDER — SODIUM CHLORIDE 0.9 % IV SOLN
12.5000 mg | Freq: Four times a day (QID) | INTRAVENOUS | Status: DC | PRN
  Administered 2021-04-03: 12.5 mg via INTRAVENOUS
  Filled 2021-04-03: qty 12.5
  Filled 2021-04-03: qty 0.5

## 2021-04-03 MED ORDER — ONDANSETRON HCL 4 MG/2ML IJ SOLN
4.0000 mg | Freq: Once | INTRAMUSCULAR | Status: AC
Start: 2021-04-03 — End: 2021-04-03
  Administered 2021-04-03: 4 mg via INTRAVENOUS
  Filled 2021-04-03: qty 2

## 2021-04-03 NOTE — ED Triage Notes (Signed)
Pt arrived via POV, c/o right flank and groin pain that he states is typical pain for his kidney stones, states pain since tues. Also states he fell down flight of stairs Thursday and does not remember event.  ?
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
## 2021-04-03 NOTE — Discharge Instructions (Addendum)
1.  Your CT scan does not show a kidney stone.  Your pain seems consistent with a kidney stone.  Sometimes a small stone can pass and not be seen on the scan.  Any stone small enough to pass will pass on its own.  Continue your home pain medications as prescribed.  An ultrasound of the testicle was also done.  No abnormalities were identified on the testicle.  You should try to follow-up with your urologist as soon as possible however due to testicular pain. ?2.  You report a fall 3 days ago.  At this time there are no specific areas that appear to be injured.  You do have chronic lower back pain which may be worse than usual.  This may also be causing your pain in your flank and radiating into your groin.  Try to apply heating packs and continue home medications.  See your doctor for recheck as soon as possible. ?

## 2021-04-04 NOTE — Telephone Encounter (Signed)
Agree with ER eval.  Thanks.  

## 2021-04-16 ENCOUNTER — Other Ambulatory Visit: Payer: Self-pay | Admitting: Family Medicine

## 2021-04-16 NOTE — Telephone Encounter (Signed)
Refill request for Hydrocodone 5-325 mg tabs and hydromorphone 2 mg tabs ? ?LOV - 02/25/21 ?Next OV - not scheduled ?Last refill - Hydrocodone 03/22/21 #240/0 ?       Hydromorphone 03/22/21 #30/0 ? ?

## 2021-04-18 MED ORDER — HYDROMORPHONE HCL 2 MG PO TABS: 2.0000 mg | ORAL_TABLET | ORAL | 0 refills | Status: DC | PRN

## 2021-04-18 MED ORDER — HYDROCODONE-ACETAMINOPHEN 5-325 MG PO TABS: 1.0000 | ORAL_TABLET | Freq: Four times a day (QID) | ORAL | 0 refills | Status: DC | PRN

## 2021-04-18 NOTE — Telephone Encounter (Signed)
Sent. Thanks.   

## 2021-04-30 ENCOUNTER — Other Ambulatory Visit: Payer: Self-pay | Admitting: Family Medicine

## 2021-05-22 ENCOUNTER — Emergency Department (HOSPITAL_BASED_OUTPATIENT_CLINIC_OR_DEPARTMENT_OTHER)
Admission: EM | Admit: 2021-05-22 | Discharge: 2021-05-22 | Disposition: A | Discharge status: Home / Self Care | Payer: Self-pay | Attending: Emergency Medicine | Admitting: Emergency Medicine

## 2021-05-22 ENCOUNTER — Emergency Department (HOSPITAL_BASED_OUTPATIENT_CLINIC_OR_DEPARTMENT_OTHER): Payer: Self-pay

## 2021-05-22 ENCOUNTER — Other Ambulatory Visit: Payer: Self-pay

## 2021-05-22 ENCOUNTER — Encounter (HOSPITAL_BASED_OUTPATIENT_CLINIC_OR_DEPARTMENT_OTHER): Payer: Self-pay | Admitting: Emergency Medicine

## 2021-05-22 DIAGNOSIS — R1032 Left lower quadrant pain: Secondary | ICD-10-CM | POA: Insufficient documentation

## 2021-05-22 DIAGNOSIS — R102 Pelvic and perineal pain: Secondary | ICD-10-CM

## 2021-05-22 DIAGNOSIS — R1031 Right lower quadrant pain: Secondary | ICD-10-CM | POA: Insufficient documentation

## 2021-05-22 DIAGNOSIS — R109 Unspecified abdominal pain: Secondary | ICD-10-CM

## 2021-05-22 DIAGNOSIS — N50811 Right testicular pain: Secondary | ICD-10-CM

## 2021-05-22 DIAGNOSIS — Z87442 Personal history of urinary calculi: Secondary | ICD-10-CM | POA: Insufficient documentation

## 2021-05-22 LAB — CBC WITH DIFFERENTIAL/PLATELET
Abs Immature Granulocytes: 0.08 10*3/uL — ABNORMAL HIGH (ref 0.00–0.07)
Basophils Absolute: 0.1 10*3/uL (ref 0.0–0.1)
Basophils Relative: 0 %
Eosinophils Absolute: 0.4 10*3/uL (ref 0.0–0.5)
Eosinophils Relative: 3 %
HCT: 48.9 % (ref 39.0–52.0)
Hemoglobin: 17 g/dL (ref 13.0–17.0)
Immature Granulocytes: 1 %
Lymphocytes Relative: 19 %
Lymphs Abs: 2.6 10*3/uL (ref 0.7–4.0)
MCH: 30.4 pg (ref 26.0–34.0)
MCHC: 34.8 g/dL (ref 30.0–36.0)
MCV: 87.3 fL (ref 80.0–100.0)
Monocytes Absolute: 1.3 10*3/uL — ABNORMAL HIGH (ref 0.1–1.0)
Monocytes Relative: 10 %
Neutro Abs: 9.2 10*3/uL — ABNORMAL HIGH (ref 1.7–7.7)
Neutrophils Relative %: 67 %
Platelets: 318 10*3/uL (ref 150–400)
RBC: 5.6 MIL/uL (ref 4.22–5.81)
RDW: 13.8 % (ref 11.5–15.5)
WBC: 13.7 10*3/uL — ABNORMAL HIGH (ref 4.0–10.5)
nRBC: 0 % (ref 0.0–0.2)

## 2021-05-22 LAB — COMPREHENSIVE METABOLIC PANEL
ALT: 81 U/L — ABNORMAL HIGH (ref 0–44)
AST: 73 U/L — ABNORMAL HIGH (ref 15–41)
Albumin: 3.9 g/dL (ref 3.5–5.0)
Alkaline Phosphatase: 73 U/L (ref 38–126)
Anion gap: 8 (ref 5–15)
BUN: 12 mg/dL (ref 6–20)
CO2: 28 mmol/L (ref 22–32)
Calcium: 9.6 mg/dL (ref 8.9–10.3)
Chloride: 103 mmol/L (ref 98–111)
Creatinine, Ser: 0.77 mg/dL (ref 0.61–1.24)
GFR, Estimated: >60 mL/min (ref >60)
Glucose, Bld: 115 mg/dL — ABNORMAL HIGH (ref 70–99)
Potassium: 3.6 mmol/L (ref 3.5–5.1)
Sodium: 139 mmol/L (ref 135–145)
Total Bilirubin: 0.5 mg/dL (ref 0.3–1.2)
Total Protein: 7 g/dL (ref 6.5–8.1)

## 2021-05-22 LAB — URINALYSIS, ROUTINE W REFLEX MICROSCOPIC
Bilirubin Urine: NEGATIVE
Glucose, UA: NEGATIVE mg/dL
Hgb urine dipstick: NEGATIVE
Ketones, ur: NEGATIVE mg/dL
Leukocytes,Ua: NEGATIVE
Nitrite: NEGATIVE
Protein, ur: NEGATIVE mg/dL
Specific Gravity, Urine: 1.02 (ref 1.005–1.030)
pH: 7 (ref 5.0–8.0)

## 2021-05-22 MED ORDER — ONDANSETRON HCL 4 MG/2ML IJ SOLN
4.0000 mg | Freq: Once | INTRAMUSCULAR | Status: AC
  Administered 2021-05-22: 4 mg via INTRAVENOUS
  Filled 2021-05-22: qty 2

## 2021-05-22 MED ORDER — HYDROMORPHONE HCL 1 MG/ML IJ SOLN
1.0000 mg | Freq: Once | INTRAMUSCULAR | Status: AC
  Administered 2021-05-22: 1 mg via INTRAVENOUS
  Filled 2021-05-22: qty 1

## 2021-05-22 MED ORDER — SODIUM CHLORIDE 0.9 % IV BOLUS
1000.0000 mL | Freq: Once | INTRAVENOUS | Status: AC
  Administered 2021-05-22: 1000 mL via INTRAVENOUS

## 2021-05-22 NOTE — ED Notes (Signed)
Pt placed on 3L [place] for oxgyen saturations ?

## 2021-05-22 NOTE — Discharge Instructions (Signed)
Your work-up essentially is normal today.  Your CT showed known kidney stones and your ultrasound showed that your right testicle is normal. ? ?You can continue to take your Vicodin as prescribed by your doctor. ? ?You also should follow-up with your urologist ? ?Return to ER if you have worse abdominal pain, pelvic pain, vomiting, fever ?

## 2021-05-22 NOTE — ED Provider Notes (Signed)
?[facility] EMERGENCY DEPARTMENT ?Provider Note ? ? ?CSN: [id number] ?Arrival date & time: 05/22/21  1636 ? ?  ? ?History ? ?Chief Complaint  ?Patient presents with  ? Flank Pain  ? ? ?Casey Caldwell is a 41 y.o. male here presenting with flank pain.  Patient states that he has been having severe sharp pain radiates to his scrotum and penis.  He states that this feels like lightning to his groin area.  Patient has multiple kidney stones that required lithotripsy.  Patient just changed his insurance and recently followed up with Dr. Alyson Ingles from [facility] urology.  Patient also has chronic pain and is on hydrocodone at baseline and has a spinal stimulator.  Finally patient had a left orchiectomy previously and never had history of testicular torsion. ? ?The history is provided by the patient.  ? ?  ? ?Home Medications ?Prior to Admission medications   ?Medication Sig Start Date End Date Taking? Authorizing Provider  ?allopurinol (ZYLOPRIM) 300 MG tablet Take 1 tablet (300 mg total) by mouth daily. 03/05/21   McKenzie, Candee Furbish, MD  ?ARIPiprazole (ABILIFY) 5 MG tablet TAKE 1/2 TABLET BY MOUTH DAILY 10/08/20   Tonia Ghent, MD  ?busPIRone (BUSPAR) 5 MG tablet TAKE ONE TABLET BY MOUTH TWICE A DAY 04/30/21   Tonia Ghent, MD  ?colchicine 0.6 MG tablet TAKE ONE TABLET BY MOUTH TWICE A DAY AS NEEDED FOR GOUT FLARE 01/29/21   Tonia Ghent, MD  ?docusate sodium (COLACE) 100 MG capsule Take 1 capsule (100 mg total) by mouth 2 (two) times daily as needed for mild constipation. 05/05/20   Tonia Ghent, MD  ?escitalopram (LEXAPRO) 20 MG tablet Take 1 tablet (20 mg total) by mouth daily. 10/28/20   Tonia Ghent, MD  ?HYDROcodone-acetaminophen (NORCO/VICODIN) 5-325 MG tablet Take 1-2 tablets by mouth every 6 (six) hours as needed for severe pain (8 tabs or less per day). 04/18/21   Tonia Ghent, MD  ?HYDROmorphone (DILAUDID) 2 MG tablet Take 1 tablet (2 mg total) by mouth every 4 (four) hours as needed for  severe pain (for kidney stones or breakthrough pain). 04/18/21   Tonia Ghent, MD  ?indapamide (LOZOL) 2.5 MG tablet TAKE ONE TABLET BY MOUTH DAILY 01/29/21   Tonia Ghent, MD  ?lisinopril (ZESTRIL) 10 MG tablet Take 1 tablet (10 mg total) by mouth daily. 10/28/20   Tonia Ghent, MD  ?loratadine (CLARITIN) 10 MG tablet Take 1 tablet (10 mg total) by mouth daily as needed for allergies. 10/28/20   Tonia Ghent, MD  ?Melatonin 10 MG TABS Take 5-10 mg by mouth at bedtime. 02/12/21   Tonia Ghent, MD  ?nystatin cream (MYCOSTATIN) Apply 1 application topically 2 (two) times daily. Use until rash is resolved and then for 2 more days. 09/14/20   Tonia Ghent, MD  ?ondansetron (ZOFRAN ODT) 8 MG disintegrating tablet Take 1 tablet (8 mg total) by mouth every 8 (eight) hours as needed for nausea or vomiting. 10/26/20   Tonia Ghent, MD  ?ondansetron (ZOFRAN-ODT) 4 MG disintegrating tablet Take 1 tablet (4 mg total) by mouth every 4 (four) hours as needed for nausea or vomiting. 04/03/21   Charlesetta Shanks, MD  ?pantoprazole (PROTONIX) 40 MG tablet Take 1 tablet (40 mg total) by mouth 2 (two) times daily. 07/17/20   Tonia Ghent, MD  ?polyethylene glycol powder (GLYCOLAX/MIRALAX) powder MIX 17G IN 4 TO 8 OUNCES OF FLUID AND TAKE TWICE DAILY  02/08/18   Tonia Ghent, MD  ?Potassium Citrate (UROCIT-K 15) 15 MEQ (1620 MG) TBCR Take 1 tablet by mouth 2 (two) times daily. 03/05/21   McKenzie, Candee Furbish, MD  ?tamsulosin (FLOMAX) 0.4 MG CAPS capsule Take 1 capsule (0.4 mg total) by mouth daily. 01/09/20   Molpus, John, MD  ?triamcinolone cream (KENALOG) 0.5 % Apply 1 application topically 2 (two) times daily. 01/13/21   Tonia Ghent, MD  ?zolpidem (AMBIEN) 10 MG tablet TAKE ONE TABLET BY MOUTH EVERY NIGHT AT BEDTIME AS NEEDED FOR SLEEP 03/22/21   Tonia Ghent, MD  ?   ? ?Allergies    ?Gabapentin, Oxycodone, Phenergan [promethazine hcl], Tape, Toradol [ketorolac tromethamine], Trileptal [oxcarbazepine],  and Wellbutrin [bupropion]   ? ?Review of Systems   ?Review of Systems  ?Genitourinary:  Positive for flank pain.  ?All other systems reviewed and are negative. ? ?Physical Exam ?Updated Vital Signs ?BP (!) 211/104   Pulse 94   Temp 98.1 ?F (36.7 ?C) (Oral)   Resp 20   Ht 6' (1.829 m)   Wt (!) 170.1 kg   SpO2 95%   BMI 50.86 kg/m?  ?Physical Exam ?Vitals and nursing note reviewed.  ?Constitutional:   ?   Comments: Uncomfortable  ?HENT:  ?   Head: Normocephalic.  ?   Nose: Nose normal.  ?   Mouth/Throat:  ?   Mouth: Mucous membranes are moist.  ?Eyes:  ?   Extraocular Movements: Extraocular movements intact.  ?   Pupils: Pupils are equal, round, and reactive to light.  ?Cardiovascular:  ?   Rate and Rhythm: Normal rate and regular rhythm.  ?   Pulses: Normal pulses.  ?   Heart sounds: Normal heart sounds.  ?Pulmonary:  ?   Effort: Pulmonary effort is normal.  ?   Breath sounds: Normal breath sounds.  ?Abdominal:  ?   General: Abdomen is flat.  ?   Palpations: Abdomen is soft.  ?   Comments: Mild bilateral groin tenderness.  No obvious CVA tenderness  ?Genitourinary: ?   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  There is no obvious Fournier's gangrene.  Patient's right testicle is present and has good cremasterics reflex and no obvious tenderness.  ?Musculoskeletal:  ?   Cervical back: Normal range of motion and neck supple.  ?Skin: ?   General: Skin is warm.  ?   Capillary Refill: Capillary refill takes less than 2 seconds.  ?Neurological:  ?   General: No focal deficit present.  ?   Mental Status: He is oriented to person, place, and time.  ?Psychiatric:     ?   Mood and Affect: Mood normal.     ?   Behavior: Behavior normal.  ? ? ?ED Results / Procedures / Treatments   ?Labs ?(all labs ordered are listed, but only abnormal results are displayed) ?Labs Reviewed  ?URINALYSIS, ROUTINE W REFLEX MICROSCOPIC  ?CBC WITH DIFFERENTIAL/PLATELET  ?COMPREHENSIVE METABOLIC PANEL   ? ? ?EKG ?None ? ?Radiology ?No results found. ? ?Procedures ?Procedures  ? ? ?Medications Ordered in ED ?Medications  ?HYDROmorphone (DILAUDID) injection 1 mg (has no administration in time range)  ?ondansetron (ZOFRAN) injection 4 mg (has no administration in time range)  ?sodium chloride 0.9 % bolus 1,000 mL (has no administration in time range)  ? ? ?ED Course/ Medical Decision Making/ A&P ?  ?                        ?  Medical Decision Making ?Casey Caldwell is a 41 y.o. male here presenting with back pain and groin pain.  Patient has history of kidney stones and concern for renal colic.  Patient also has chronic pain as well. Plan to get CBC and CMP and CT renal stone and urinalysis.  Plan also to give pain medicine and Zofran and IV fluids ? ?8:42 PM ?CT showed nonobstructing calculus.  Ultrasound performed and showed normal right testicle and epididymis.  Patient's pain is under control now.  I suspect that he may have passed a kidney stone.  He is on chronic Vicodin and has spinal stimulator for pain.  At this point, patient is stable for discharge. ? ?Problems Addressed: ?Flank pain: acute illness or injury ?Pelvic pain: chronic illness or injury with exacerbation, progression, or side effects of treatment ? ?Amount and/or Complexity of Data Reviewed ?Labs: ordered. Decision-making details documented in ED Course. ?Radiology: ordered and independent interpretation performed. Decision-making details documented in ED Course. ? ?Risk ?Prescription drug management. ? ?Final Clinical Impression(s) / ED Diagnoses ?Final diagnoses:  ?None  ? ? ?Rx / DC Orders ?ED Discharge Orders   ? ? None  ? ?  ? ? ?  ?Drenda Freeze, MD ?05/22/21 2043 ? ?

## 2021-05-22 NOTE — ED Triage Notes (Signed)
Pt arrives pov, to triage in wheelchair, c/o bilateral lower back pain radiating to RLQ and dysuria. Also reports scrotum and penis burning, concern for Kidney stones. Reports nausea ?

## 2021-05-24 ENCOUNTER — Other Ambulatory Visit: Payer: Self-pay | Admitting: Family Medicine

## 2021-05-24 NOTE — Telephone Encounter (Signed)
Refill request for Ambien, Norco, and Hyrdomorphone ? ?LOV - 02/12/21 ?Next OV - not scheduled ?Last refill - Ambien 03/22/21 #90/0 ?                  Norco 04/18/21 #240/0 ?                  Hydromorphone #30/0 ? ?

## 2021-05-25 MED ORDER — HYDROCODONE-ACETAMINOPHEN 5-325 MG PO TABS
1.0000 | ORAL_TABLET | Freq: Four times a day (QID) | ORAL | 0 refills | Status: DC | PRN
Start: 2021-05-25 — End: 2021-06-17

## 2021-05-25 MED ORDER — ZOLPIDEM TARTRATE 10 MG PO TABS: ORAL_TABLET | ORAL | 0 refills | Status: DC

## 2021-05-25 MED ORDER — HYDROMORPHONE HCL 2 MG PO TABS: 2.0000 mg | ORAL_TABLET | ORAL | 0 refills | Status: DC | PRN

## 2021-06-09 ENCOUNTER — Other Ambulatory Visit: Payer: Self-pay | Admitting: Family Medicine

## 2021-06-09 NOTE — Telephone Encounter (Signed)
Last filled 01/27/21 ? ?Last ov 03/18/21  ?Next ov none ? ?Is this okay to refill  ?

## 2021-06-09 NOTE — Telephone Encounter (Signed)
Sent. Thanks.   

## 2021-06-10 ENCOUNTER — Other Ambulatory Visit: Payer: Self-pay

## 2021-06-11 MED ORDER — ONDANSETRON 4 MG PO TBDP: 4.0000 mg | ORAL_TABLET | ORAL | 0 refills | Status: DC | PRN

## 2021-06-17 ENCOUNTER — Encounter: Payer: Self-pay | Admitting: Family Medicine

## 2021-06-17 ENCOUNTER — Telehealth: Payer: Self-pay | Admitting: Family Medicine

## 2021-06-17 ENCOUNTER — Telehealth (INDEPENDENT_AMBULATORY_CARE_PROVIDER_SITE_OTHER): Payer: 59 | Admitting: Family Medicine

## 2021-06-17 DIAGNOSIS — M109 Gout, unspecified: Secondary | ICD-10-CM | POA: Diagnosis not present

## 2021-06-17 DIAGNOSIS — G894 Chronic pain syndrome: Secondary | ICD-10-CM | POA: Diagnosis not present

## 2021-06-17 DIAGNOSIS — G4700 Insomnia, unspecified: Secondary | ICD-10-CM

## 2021-06-17 MED ORDER — HYDROCODONE-ACETAMINOPHEN 5-325 MG PO TABS: 1.0000 | ORAL_TABLET | Freq: Four times a day (QID) | ORAL | 0 refills | Status: DC | PRN

## 2021-06-17 MED ORDER — HYDROMORPHONE HCL 2 MG PO TABS: 2.0000 mg | ORAL_TABLET | ORAL | 0 refills | Status: DC | PRN

## 2021-06-17 NOTE — Telephone Encounter (Signed)
Called pharmacy and gave the okay to fill.  Thanks .

## 2021-06-17 NOTE — Telephone Encounter (Signed)
Pt called back and said that the pharmacy said it was to early to fill [place] and they just needed an early release approval from Dr Damita Dunnings in order to fill it today. Please advise

## 2021-06-17 NOTE — Telephone Encounter (Signed)
Called pt.  He is aware.  Thanks.

## 2021-06-17 NOTE — Progress Notes (Signed)
Interactive audio and video telecommunications were attempted between this provider and patient, however failed, due to patient having technical difficulties OR patient did not have access to video capability.  We continued and completed visit with audio only.   Virtual Visit via Telephone Note  I connected with patient on 06/17/21  at 2:15 PM  by telephone and verified that I am speaking with the correct person using two identifiers.  Location of patient: at a local store.     Location of MD: [facility] Name of referring provider (if blank then none associated): Names per persons and role in encounter:  MD: Earlyne Iba, Patient: name listed above.    I discussed the limitations, risks, security and privacy concerns of performing an evaluation and management service by telephone and the availability of in person appointments. I also discussed with the patient that there may be a patient responsible charge related to this service. The patient expressed understanding and agreed to proceed.  CC: chronic pain.    History of Present Illness:   Rare colchicine use and taking allopurinol daily.   Taking ambien at night.  No ADE on med.  It helps with sleep.  Still living with Leonides Sake.    He is having surgery in [place] re: replacement of his stimulator to help with back pain.  If that isn't helpful he may need extra intervention.  He is still in constant pain.  Surgery is scheduled for Tuesday of next week.  Still on baseline meds.  Leonides Sake is going to help care for patient after surgery.  He is hopeful about getting relief.  Sleep has been disrupted to pain.  He has been trying to stay busy in the meantime but concentration is affected by his pain.  He is still applying for disability.  He had psych eval as part of that process with another visit pending at a chiropractor clinic.    Sedation caution d/w pt.  D/w pt about managing constipation.  Mood d/w pt.  He is tired but he is optimistic  about his procedure, "doing pretty decent."    Rx sent.  He is travelling Tuesday 06/22/21.    Observations/Objective: Nad Speech normal.  Judgment intact  Assessment and Plan: Chronic pain.  Fortunately he is going to have his spinal stimulator replaced and he is optimistic about that.  Sleep has been disrupted by pain but hopefully his new stimulator will help with that.  He is applying for disability and he had recent psychological evaluation as part of that with another visit pending at a chiropractor.  I think it makes sense to continue his opiates for now.  He is not sedated.  Still okay for outpatient follow-up.  He agrees to plan.  Insomnia.  Continue Ambien.  No ADE on med.   History of gout.  Continue as needed colchicine.  Continue daily allopurinol.  Follow Up Instructions: see above.    I discussed the assessment and treatment plan with the patient. The patient was provided an opportunity to ask questions and all were answered. The patient agreed with the plan and demonstrated an understanding of the instructions.   The patient was advised to call back or seek an in-person evaluation if the symptoms worsen or if the condition fails to improve as anticipated.  I provided 18 minutes of non-face-to-face time during this encounter.  Elsie Stain, MD

## 2021-06-21 NOTE — Assessment & Plan Note (Signed)
History of gout.  Continue as needed colchicine.  Continue daily allopurinol.

## 2021-06-21 NOTE — Assessment & Plan Note (Signed)
Chronic pain.  Fortunately he is going to have his spinal stimulator replaced and he is optimistic about that.  Sleep has been disrupted by pain but hopefully his new stimulator will help with that.  He is applying for disability and he had recent psychological evaluation as part of that with another visit pending at a chiropractor.  I think it makes sense to continue his opiates for now.  He is not sedated.  Still okay for outpatient follow-up.  He agrees to plan.

## 2021-06-21 NOTE — Assessment & Plan Note (Signed)
  Insomnia.  Continue Ambien.  No ADE on med.

## 2021-06-22 ENCOUNTER — Encounter: Payer: Self-pay | Admitting: Family Medicine

## 2021-06-27 ENCOUNTER — Encounter: Payer: Self-pay | Admitting: Family Medicine

## 2021-06-30 ENCOUNTER — Telehealth: Payer: Self-pay

## 2021-06-30 NOTE — Telephone Encounter (Signed)
Patient is calling in stating that he had surgery on his back a week ago, and dropped a can on his toe afterwards. Casey Caldwell is now complaining that his foot is swollen and very painful. Wondering what he should do.

## 2021-07-01 NOTE — Telephone Encounter (Signed)
Patient advised via mychart that he needs an appointment-see phone note in regards to this, patient viewed my message earlier today.

## 2021-07-01 NOTE — Telephone Encounter (Signed)
See mychart message also, advised patient we need to get patient in for an appointment to evaluate. Waiting for reply

## 2021-07-02 NOTE — Telephone Encounter (Signed)
Patient scheduled appt for 07/08/21

## 2021-07-02 NOTE — Telephone Encounter (Signed)
Please call patient and see about getting evaluated in person.  Thanks.

## 2021-07-06 ENCOUNTER — Ambulatory Visit: Payer: 59 | Admitting: Family Medicine

## 2021-07-06 ENCOUNTER — Encounter: Payer: Self-pay | Admitting: Family Medicine

## 2021-07-06 NOTE — Telephone Encounter (Signed)
Patient wants to see if he can get his prescriptions sent in early. LOV - 06/17/21, NOV - 07/12/21, RF - 06/17/21 on both

## 2021-07-07 ENCOUNTER — Other Ambulatory Visit: Payer: Self-pay | Admitting: Family Medicine

## 2021-07-07 DIAGNOSIS — N200 Calculus of kidney: Secondary | ICD-10-CM

## 2021-07-07 NOTE — Telephone Encounter (Signed)
Please call patient.  If he is having this much postop pain did he check with the surgeon about his postop pain management in the meantime?  Please let me know.  I want him to do that if he has not already done so.  When is going to follow-up with the surgeon?  I do not see where that has been done yet.  Please let me know about that.  The notes from the surgery clinic also mention him getting a prescription for Norco 10 to use as needed for pain in the meantime.  Did he get that filled?  Please let me know.  We will go from there.

## 2021-07-08 ENCOUNTER — Ambulatory Visit: Payer: 59 | Admitting: Family Medicine

## 2021-07-12 ENCOUNTER — Telehealth (INDEPENDENT_AMBULATORY_CARE_PROVIDER_SITE_OTHER): Payer: 59 | Admitting: Family Medicine

## 2021-07-12 ENCOUNTER — Encounter: Payer: Self-pay | Admitting: Family Medicine

## 2021-07-12 DIAGNOSIS — G8929 Other chronic pain: Secondary | ICD-10-CM

## 2021-07-12 DIAGNOSIS — M549 Dorsalgia, unspecified: Secondary | ICD-10-CM

## 2021-07-12 MED ORDER — HYDROMORPHONE HCL 2 MG PO TABS: 2.0000 mg | ORAL_TABLET | ORAL | 0 refills | Status: DC | PRN

## 2021-07-12 MED ORDER — HYDROCODONE-ACETAMINOPHEN 5-325 MG PO TABS: 1.0000 | ORAL_TABLET | Freq: Four times a day (QID) | ORAL | 0 refills | Status: DC | PRN

## 2021-07-12 NOTE — Progress Notes (Unsigned)
Virtual visit completed through WebEx or similar program Patient location: home  Provider location: [place] at [facility], office  Participants: Patient and me (unless stated otherwise below)  Pandemic considerations d/w pt.   Limitations and rationale for visit method d/w patient.  Patient agreed to proceed.   CC: back pain follow up.    HPI: he had spinal stimulator replaced.  He is better than prior to surgery but he is going to check on getting a new setting to help with the pain.    D/w pt about incision site, no bruising.  Normal inspection per patient report.  No spreading redness or drainage.  No incisional site pain now.  Some local itching with the healing process as expected.    D/w pt about L groin and leg pain.  His groin and leg pain are some better but still has an 8 lbs limit for lifting.  He has pain with lifting.    He has some tingling in the R leg in the meantime, since the implant.  No focal weakness in the BLE.    No constipation with current meds.  He has 2 month f/u pending.    His prev R sided back pain was better after the implant until he restrained his back again in the meantime, after implant.    He met with disability personnel with a follow up pending.   He is awaiting determination form [place].    He may end up needing surgery, depending on his eventual effect from the implant.  He would need to lose weight prior to surgery.    Mood d/w pt.  Emotionally, "I'm doing better."  He is trying to stay active as possible at home.  He isn't sedated.  He didn't fill his rx for pain medication that was from the surgery clinic.  D/w pt.    We talked about baseline med use, post op med use, and his long term goals.  Hopefully he will be able to get his back and groin pain under better control with a new stimulator, gradually lose weight, and require less pain medication in the long run.  Meds and allergies reviewed.   ROS: Per HPI unless specifically indicated  in ROS section   NAD Speech wnl  A/P:  Chronic back pain status post spinal stimulator implant.  He had increased postop pain.  He had his baseline medications but that did not cover his postop pain.  He did not use the prescription for pain medicine that was prescribed by the pain clinic.  I sent his prescriptions to restart his baseline chronic use today.  He is going to follow-up with the stimulator representatives and then follow-up with surgery clinic.  Hopefully he will have decreased pain and be able to taper down on his pain medication as he goes along.  We talked about managing his weight and his diet.  His mood is reasonable and he is "doing better" given the progress that he made.  He is waiting determination on his disability paperwork.  He will update me as needed.  Not sedated.  Still okay for outpatient follow-up.

## 2021-07-14 NOTE — Assessment & Plan Note (Signed)
Chronic back pain status post spinal stimulator implant.  He had increased postop pain.  He had his baseline medications but that did not cover his postop pain.  He did not use the prescription for pain medicine that was prescribed by the pain clinic.  I sent his prescriptions to restart his baseline chronic use today.  He is going to follow-up with the stimulator representatives and then follow-up with surgery clinic.  Hopefully he will have decreased pain and be able to taper down on his pain medication as he goes along.  We talked about managing his weight and his diet.  His mood is reasonable and he is "doing better" given the progress that he made.  He is waiting determination on his disability paperwork.  He will update me as needed.  Not sedated.  Still okay for outpatient follow-up.

## 2021-07-30 ENCOUNTER — Encounter: Payer: Self-pay | Admitting: Family Medicine

## 2021-07-30 NOTE — Telephone Encounter (Signed)
I called patient and spoke to him about what is going on. As of 2 days ago his disability was denied and now he is suing them. He has a Chief Executive Officer that needs a letter from Dr. Damita Dunnings about his care/needs. Patient has a document that has everything in it that is needed and will be bringing it to his OV on 08/03/21. I told patient I would give Dr. Damita Dunnings a heads up about this and that he most likely will not do anything until his OV so this can all be discussed then. Patient was fine with this.

## 2021-08-01 NOTE — Telephone Encounter (Signed)
Duly noted.  Will discuss with patient at office visit.  Thanks.

## 2021-08-03 ENCOUNTER — Encounter: Payer: Self-pay | Admitting: Family Medicine

## 2021-08-03 ENCOUNTER — Encounter (HOSPITAL_BASED_OUTPATIENT_CLINIC_OR_DEPARTMENT_OTHER): Payer: Self-pay | Admitting: Emergency Medicine

## 2021-08-03 ENCOUNTER — Emergency Department (HOSPITAL_BASED_OUTPATIENT_CLINIC_OR_DEPARTMENT_OTHER): Payer: 59

## 2021-08-03 ENCOUNTER — Telehealth (INDEPENDENT_AMBULATORY_CARE_PROVIDER_SITE_OTHER): Payer: 59 | Admitting: Family Medicine

## 2021-08-03 ENCOUNTER — Encounter: Payer: Self-pay | Admitting: *Deleted

## 2021-08-03 ENCOUNTER — Emergency Department (HOSPITAL_BASED_OUTPATIENT_CLINIC_OR_DEPARTMENT_OTHER)
Admission: EM | Admit: 2021-08-03 | Discharge: 2021-08-03 | Disposition: A | Discharge status: Home / Self Care | Payer: 59 | Attending: Emergency Medicine | Admitting: Emergency Medicine

## 2021-08-03 ENCOUNTER — Other Ambulatory Visit: Payer: Self-pay

## 2021-08-03 VITALS — Ht 72.0 in | Wt 375.0 lb

## 2021-08-03 DIAGNOSIS — R1030 Lower abdominal pain, unspecified: Secondary | ICD-10-CM | POA: Diagnosis not present

## 2021-08-03 DIAGNOSIS — R109 Unspecified abdominal pain: Secondary | ICD-10-CM

## 2021-08-03 DIAGNOSIS — M545 Low back pain, unspecified: Secondary | ICD-10-CM | POA: Insufficient documentation

## 2021-08-03 DIAGNOSIS — N50812 Left testicular pain: Secondary | ICD-10-CM | POA: Insufficient documentation

## 2021-08-03 DIAGNOSIS — N200 Calculus of kidney: Secondary | ICD-10-CM | POA: Diagnosis not present

## 2021-08-03 DIAGNOSIS — G894 Chronic pain syndrome: Secondary | ICD-10-CM

## 2021-08-03 LAB — CBC WITH DIFFERENTIAL/PLATELET
Abs Immature Granulocytes: 0.08 10*3/uL — ABNORMAL HIGH (ref 0.00–0.07)
Basophils Absolute: 0.1 10*3/uL (ref 0.0–0.1)
Basophils Relative: 1 %
Eosinophils Absolute: 0.4 10*3/uL (ref 0.0–0.5)
Eosinophils Relative: 3 %
HCT: 50 % (ref 39.0–52.0)
Hemoglobin: 17.1 g/dL — ABNORMAL HIGH (ref 13.0–17.0)
Immature Granulocytes: 1 %
Lymphocytes Relative: 18 %
Lymphs Abs: 2.3 10*3/uL (ref 0.7–4.0)
MCH: 30.2 pg (ref 26.0–34.0)
MCHC: 34.2 g/dL (ref 30.0–36.0)
MCV: 88.3 fL (ref 80.0–100.0)
Monocytes Absolute: 1 10*3/uL (ref 0.1–1.0)
Monocytes Relative: 7 %
Neutro Abs: 9.2 10*3/uL — ABNORMAL HIGH (ref 1.7–7.7)
Neutrophils Relative %: 70 %
Platelets: 322 10*3/uL (ref 150–400)
RBC: 5.66 MIL/uL (ref 4.22–5.81)
RDW: 13.9 % (ref 11.5–15.5)
WBC: 13 10*3/uL — ABNORMAL HIGH (ref 4.0–10.5)
nRBC: 0 % (ref 0.0–0.2)

## 2021-08-03 LAB — URINALYSIS, ROUTINE W REFLEX MICROSCOPIC
Bilirubin Urine: NEGATIVE
Glucose, UA: NEGATIVE mg/dL
Hgb urine dipstick: NEGATIVE
Ketones, ur: NEGATIVE mg/dL
Leukocytes,Ua: NEGATIVE
Nitrite: NEGATIVE
Protein, ur: NEGATIVE mg/dL
Specific Gravity, Urine: 1.025 (ref 1.005–1.030)
pH: 5.5 (ref 5.0–8.0)

## 2021-08-03 LAB — BASIC METABOLIC PANEL
Anion gap: 8 (ref 5–15)
BUN: 13 mg/dL (ref 6–20)
CO2: 29 mmol/L (ref 22–32)
Calcium: 9.8 mg/dL (ref 8.9–10.3)
Chloride: 103 mmol/L (ref 98–111)
Creatinine, Ser: 0.94 mg/dL (ref 0.61–1.24)
GFR, Estimated: >60 mL/min (ref >60)
Glucose, Bld: 135 mg/dL — ABNORMAL HIGH (ref 70–99)
Potassium: 3.6 mmol/L (ref 3.5–5.1)
Sodium: 140 mmol/L (ref 135–145)

## 2021-08-03 MED ORDER — POLYETHYLENE GLYCOL 3350 17 GM/SCOOP PO POWD
ORAL | 1 refills | Status: DC
Start: 2021-08-03 — End: 2021-12-20

## 2021-08-03 MED ORDER — HYDROMORPHONE HCL 1 MG/ML IJ SOLN
1.0000 mg | Freq: Once | INTRAMUSCULAR | Status: AC
  Administered 2021-08-03: 1 mg via INTRAVENOUS
  Filled 2021-08-03: qty 1

## 2021-08-03 MED ORDER — ONDANSETRON HCL 4 MG/2ML IJ SOLN
4.0000 mg | Freq: Once | INTRAMUSCULAR | Status: AC
  Administered 2021-08-03: 4 mg via INTRAVENOUS
  Filled 2021-08-03: qty 2

## 2021-08-03 NOTE — Discharge Instructions (Signed)
Your CT scan and ultrasound did not show any new findings.  Follow-up with your primary care doctor this week.  Return if you have fevers or worsening symptoms or any additional concerns.

## 2021-08-03 NOTE — ED Provider Notes (Signed)
[facility] EMERGENCY DEPARTMENT Provider Note   CSN: [id number] Arrival date & time: 08/03/21  1518     History  Chief Complaint  Patient presents with   Groin Pain    Casey Caldwell is a 41 y.o. male.  Patient presents with recurrence of his intermittent lower back pain rating into the left groin.  He has had this issue multiple times in the past but feels like it is been going on for the past week again.  He called his primary care doctor who advised him to go to the ER.  Describes as severe pain, no associated fevers or cough or vomiting or diarrhea.  States he has a history of kidney stones as well in the past and also has a spinal cord stimulator placed in May.       Home Medications Prior to Admission medications   Medication Sig Start Date End Date Taking? Authorizing Provider  allopurinol (ZYLOPRIM) 300 MG tablet Take 1 tablet (300 mg total) by mouth daily. 03/05/21   McKenzie, Candee Furbish, MD  ARIPiprazole (ABILIFY) 5 MG tablet TAKE 1/2 TABLET BY MOUTH DAILY 06/09/21   Tonia Ghent, MD  busPIRone (BUSPAR) 5 MG tablet TAKE ONE TABLET BY MOUTH TWICE A DAY 04/30/21   Tonia Ghent, MD  colchicine 0.6 MG tablet TAKE ONE TABLET BY MOUTH TWICE A DAY AS NEEDED FOR GOUT FLARE 01/29/21   Tonia Ghent, MD  docusate sodium (COLACE) 100 MG capsule Take 1 capsule (100 mg total) by mouth 2 (two) times daily as needed for mild constipation. 05/05/20   Tonia Ghent, MD  escitalopram (LEXAPRO) 20 MG tablet Take 1 tablet (20 mg total) by mouth daily. 10/28/20   Tonia Ghent, MD  HYDROcodone-acetaminophen (NORCO/VICODIN) 5-325 MG tablet Take 1-2 tablets by mouth every 6 (six) hours as needed for severe pain (8 tabs or less per day). Patient had increased use on last rx due to recent back surgery. 07/12/21   Tonia Ghent, MD  HYDROmorphone (DILAUDID) 2 MG tablet Take 1 tablet (2 mg total) by mouth every 4 (four) hours as needed for severe pain (for kidney stones or  breakthrough pain). Patient had increased use on last rx due to recent back surgery. 07/12/21   Tonia Ghent, MD  indapamide (LOZOL) 2.5 MG tablet TAKE ONE TABLET BY MOUTH DAILY 01/29/21   Tonia Ghent, MD  lisinopril (ZESTRIL) 10 MG tablet Take 1 tablet (10 mg total) by mouth daily. 10/28/20   Tonia Ghent, MD  loratadine (CLARITIN) 10 MG tablet Take 1 tablet (10 mg total) by mouth daily as needed for allergies. 10/28/20   Tonia Ghent, MD  Melatonin 10 MG TABS Take 5-10 mg by mouth at bedtime. 02/12/21   Tonia Ghent, MD  nystatin cream (MYCOSTATIN) Apply 1 application topically 2 (two) times daily. Use until rash is resolved and then for 2 more days. 09/14/20   Tonia Ghent, MD  ondansetron (ZOFRAN-ODT) 4 MG disintegrating tablet Take 1 tablet (4 mg total) by mouth every 4 (four) hours as needed for nausea or vomiting. 06/11/21   Tonia Ghent, MD  pantoprazole (PROTONIX) 40 MG tablet TAKE ONE TABLET BY MOUTH TWICE A DAY 06/09/21   Tonia Ghent, MD  polyethylene glycol powder (GLYCOLAX/MIRALAX) 17 GM/SCOOP powder MIX 17G IN 4 TO 8 OUNCES OF FLUID AND TAKE TWICE DAILY IF NEEDED 08/03/21   Tonia Ghent, MD  Potassium Citrate (UROCIT-K 15) 15  MEQ (1620 MG) TBCR Take 1 tablet by mouth 2 (two) times daily. 03/05/21   McKenzie, Candee Furbish, MD  tamsulosin (FLOMAX) 0.4 MG CAPS capsule Take 1 capsule (0.4 mg total) by mouth daily. 01/09/20   Molpus, John, MD  triamcinolone cream (KENALOG) 0.5 % Apply 1 application topically 2 (two) times daily. 01/13/21   Tonia Ghent, MD  zolpidem (AMBIEN) 10 MG tablet TAKE ONE TABLET BY MOUTH EVERY NIGHT AT BEDTIME AS NEEDED FOR SLEEP 05/25/21   Tonia Ghent, MD      Allergies    Gabapentin, Oxycodone, Phenergan [promethazine hcl], Tape, Toradol [ketorolac tromethamine], Trileptal [oxcarbazepine], and Wellbutrin [bupropion]    Review of Systems   Review of Systems  Constitutional:  Negative for fever.  HENT:  Negative for ear pain and  sore throat.   Eyes:  Negative for pain.  Respiratory:  Negative for cough.   Cardiovascular:  Negative for chest pain.  Gastrointestinal:  Negative for abdominal pain.  Genitourinary:  Positive for flank pain.  Musculoskeletal:  Positive for back pain.  Skin:  Negative for color change and rash.  Neurological:  Negative for syncope.  All other systems reviewed and are negative.   Physical Exam Updated Vital Signs BP (!) 163/111   Pulse 92   Temp 98.3 F (36.8 C) (Oral)   Resp 20   Wt (!) 170.1 kg   SpO2 94%   BMI 50.86 kg/m  Physical Exam Constitutional:      Appearance: He is well-developed.  HENT:     Head: Normocephalic.     Nose: Nose normal.  Eyes:     Extraocular Movements: Extraocular movements intact.  Cardiovascular:     Rate and Rhythm: Normal rate.  Pulmonary:     Effort: Pulmonary effort is normal.  Genitourinary:    Penis: Normal.      Testes: Normal.     Comments: Normal testicular lie.  Mild tenderness on the left groin and testicle region. Skin:    Coloration: Skin is not jaundiced.  Neurological:     Mental Status: He is alert. Mental status is at baseline.     ED Results / Procedures / Treatments   Labs (all labs ordered are listed, but only abnormal results are displayed) Labs Reviewed  BASIC METABOLIC PANEL - Abnormal; Notable for the following components:      Result Value   Glucose, Bld 135 (*)    All other components within normal limits  CBC WITH DIFFERENTIAL/PLATELET - Abnormal; Notable for the following components:   WBC 13.0 (*)    Hemoglobin 17.1 (*)    Neutro Abs 9.2 (*)    Abs Immature Granulocytes 0.08 (*)    All other components within normal limits  URINALYSIS, ROUTINE W REFLEX MICROSCOPIC    EKG EKG Interpretation  Date/Time:  Tuesday August 03 2021 15:37:26 EDT Ventricular Rate:  92 PR Interval:  158 QRS Duration: 84 QT Interval:  362 QTC Calculation: 447 R Axis:   136 Text Interpretation: Normal sinus rhythm  Right axis deviation Possible Anterior infarct , age undetermined Abnormal ECG When compared with ECG of 31-Dec-2019 08:52, PREVIOUS ECG IS PRESENT Confirmed by Thamas Jaegers ([id number]) on 08/03/2021 4:09:11 PM  Radiology US SCROTUM W/DOPPLER  Result Date: 08/03/2021 CLINICAL DATA:  Testicular pain status post left orchiectomy. EXAM: SCROTAL ULTRASOUND DOPPLER ULTRASOUND OF THE TESTICLES TECHNIQUE: Complete ultrasound examination of the testicles, epididymis, and other scrotal structures was performed. Color and spectral Doppler ultrasound were also utilized to evaluate  blood flow to the testicles. COMPARISON:  Scrotal ultrasound 05/22/2021. FINDINGS: Right testicle Measurements: 4.4 x 2.0 x 3.6 cm. No mass or microlithiasis visualized. Left testicle Status post orchiectomy. Right epididymis:  Normal in size and appearance. Left epididymis:  Normal in size and appearance. Hydrocele:  Small right hydrocele. Varicocele:  None visualized. Pulsed Doppler interrogation of right testes demonstrates normal low resistance arterial and venous waveforms. IMPRESSION: 1. Normal appearance of right testicle and epididymis. 2. Small right hydrocele. 3. Left orchiectomy. Electronically Signed   By: Ronney Asters M.D.   On: 08/03/2021 17:29   CT Renal Stone Study  Result Date: 08/03/2021 CLINICAL DATA:  Flank pain. EXAM: CT ABDOMEN AND PELVIS WITHOUT CONTRAST TECHNIQUE: Multidetector CT imaging of the abdomen and pelvis was performed following the standard protocol without IV contrast. RADIATION DOSE REDUCTION: This exam was performed according to the departmental dose-optimization program which includes automated exposure control, adjustment of the mA and/or kV according to patient size and/or use of iterative reconstruction technique. COMPARISON:  Renal stone CT 05/22/2021 FINDINGS: Lower chest: No acute abnormality. Hepatobiliary: Diffuse fatty infiltration of the liver is unchanged. The gallbladder is surgically absent. No  biliary ductal dilatation. Pancreas: Unremarkable. No pancreatic ductal dilatation or surrounding inflammatory changes. Spleen: Normal in size without focal abnormality. Adrenals/Urinary Tract: Punctate nonobstructing left renal calculi are again seen. No ureteral or bladder calculi are seen. There is no hydronephrosis or perinephric stranding. The bladder is completely decompressed. The adrenal glands are within normal limits. Stomach/Bowel: Stomach is within normal limits. Appendix appears normal. No evidence of bowel wall thickening, distention, or inflammatory changes. Appendix is not seen. Vascular/Lymphatic: No significant vascular findings are present. No enlarged abdominal or pelvic lymph nodes. Reproductive: Prostate is unremarkable. Other: Small fat containing umbilical hernia.  No ascites. Musculoskeletal: Thoracic spinal stimulator device present. No acute fractures. IMPRESSION: 1. No acute abnormality. 2. Stable nonobstructing left renal calculi. 3. Hepatic steatosis. Electronically Signed   By: Ronney Asters M.D.   On: 08/03/2021 16:30    Procedures Procedures    Medications Ordered in ED Medications  ondansetron (ZOFRAN) injection 4 mg (has no administration in time range)  HYDROmorphone (DILAUDID) injection 1 mg (1 mg Intravenous Given 08/03/21 1656)    ED Course/ Medical Decision Making/ A&P                           Medical Decision Making Amount and/or Complexity of Data Reviewed Labs: ordered. Radiology: ordered.  Risk Prescription drug management.   Review of records shows office visit June 42,023 for chronic pain.  Cardiac monitoring showing sinus rhythm.  Diagnostic studies includes labs CBC chemistry white count 13 chemistry unremarkable urinalysis negative nitrates negative leukocytes negative blood.  CT shows no evidence of kidney stone in the ureters.  Ultrasound shows no evidence of torsion.  Patient given Dilaudid and Zofran.  Will be discharged home to  follow-up with his primary care doctor this week, advised return for fevers worsening symptoms or any additional concerns.        Final Clinical Impression(s) / ED Diagnoses Final diagnoses:  Flank pain  Pain in left testicle    Rx / DC Orders ED Discharge Orders     None         Luna Fuse, MD 08/03/21 1750
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
## 2021-08-03 NOTE — ED Triage Notes (Signed)
Pt c/o BL groin pain, back pain, and burning with urination x 1 week. Spoke with PCP today. States the PCP ordered a CT scan but pain became unbearable and pt was instructed to come here. Hx of chronic kidney stones.   Also endorses constipation which he has also seen his pcp for.   Recently had surgery to replace spinal stimulator (5/30).

## 2021-08-03 NOTE — ED Notes (Addendum)
Pt requesting nausea medication. EDP notified

## 2021-08-03 NOTE — ED Notes (Signed)
Discharge instructions and recommendations reviewed with patient. States understanding. Pt calling [employer] to transport home due to receiving narcotic pain medication and unable to drive himself. Security is aware that pt is not to drive home. Pt ambulatory upon discharge

## 2021-08-03 NOTE — ED Notes (Signed)
Patient transported to Ultrasound 

## 2021-08-04 ENCOUNTER — Encounter: Payer: Self-pay | Admitting: Family Medicine

## 2021-08-04 NOTE — Assessment & Plan Note (Signed)
  Chronic pain.  Now with flare of right groin pain. D/w pt about renal u/s to eval for stones.  Ordered. No change in meds.  Not sedated.  Advised not to drive if drowsy. He'll check with stimulator company if the current adjustment on his settings is not helpful.  I am hopeful that the recent change will be helpful. I do not suspect that he has infected hardware or incision site given that he does not have a fever or any redness reported at the site. At this point still okay for outpatient follow-up. I will await the letter that he is going to see me as above and I will address that.

## 2021-08-08 ENCOUNTER — Encounter: Payer: Self-pay | Admitting: Family Medicine

## 2021-08-11 ENCOUNTER — Other Ambulatory Visit: Payer: Self-pay | Admitting: Family Medicine

## 2021-08-11 MED ORDER — HYDROCODONE-ACETAMINOPHEN 5-325 MG PO TABS: 1.0000 | ORAL_TABLET | Freq: Four times a day (QID) | ORAL | 0 refills | Status: DC | PRN

## 2021-08-11 MED ORDER — HYDROMORPHONE HCL 2 MG PO TABS: 2.0000 mg | ORAL_TABLET | ORAL | 0 refills | Status: DC | PRN

## 2021-08-11 NOTE — Telephone Encounter (Signed)
Patient called and asked about the medication refills and the letter to the lawyer. Call back number [phone].

## 2021-08-16 ENCOUNTER — Encounter: Payer: Self-pay | Admitting: Family Medicine

## 2021-08-18 NOTE — Telephone Encounter (Signed)
Called patient about letter. He would like this mailed to him. I have placed letter in the mail.

## 2021-08-26 ENCOUNTER — Other Ambulatory Visit: Payer: Self-pay | Admitting: Family Medicine

## 2021-08-26 MED ORDER — PANTOPRAZOLE SODIUM 40 MG PO TBEC: 40.0000 mg | DELAYED_RELEASE_TABLET | Freq: Two times a day (BID) | ORAL | 0 refills | Status: DC

## 2021-08-26 MED ORDER — ONDANSETRON 4 MG PO TBDP: 4.0000 mg | ORAL_TABLET | ORAL | 0 refills | Status: DC | PRN

## 2021-08-26 NOTE — Telephone Encounter (Signed)
Refill request for Ambien 10 mg tabs  LOV - 08/03/21 Next OV - not scheduled Last refill - 05/25/21 #90/0

## 2021-08-27 MED ORDER — ZOLPIDEM TARTRATE 10 MG PO TABS: ORAL_TABLET | ORAL | 0 refills | Status: DC

## 2021-09-10 ENCOUNTER — Encounter: Payer: Self-pay | Admitting: Family Medicine

## 2021-09-10 ENCOUNTER — Ambulatory Visit (INDEPENDENT_AMBULATORY_CARE_PROVIDER_SITE_OTHER): Payer: 59 | Admitting: Family Medicine

## 2021-09-10 VITALS — BP 142/92 | HR 103 | Temp 98.0°F | Ht 72.0 in | Wt 385.0 lb

## 2021-09-10 DIAGNOSIS — I10 Essential (primary) hypertension: Secondary | ICD-10-CM

## 2021-09-10 DIAGNOSIS — N200 Calculus of kidney: Secondary | ICD-10-CM | POA: Diagnosis not present

## 2021-09-10 DIAGNOSIS — Z6841 Body Mass Index (BMI) 40.0 and over, adult: Secondary | ICD-10-CM

## 2021-09-10 DIAGNOSIS — G8929 Other chronic pain: Secondary | ICD-10-CM

## 2021-09-10 MED ORDER — INDAPAMIDE 2.5 MG PO TABS: 2.5000 mg | ORAL_TABLET | Freq: Every day | ORAL | 1 refills | Status: DC

## 2021-09-10 MED ORDER — ALLOPURINOL 300 MG PO TABS: 300.0000 mg | ORAL_TABLET | Freq: Every day | ORAL | 1 refills | Status: DC

## 2021-09-10 MED ORDER — HYDROCODONE-ACETAMINOPHEN 5-325 MG PO TABS: 1.0000 | ORAL_TABLET | Freq: Four times a day (QID) | ORAL | 0 refills | Status: DC | PRN

## 2021-09-10 MED ORDER — ESCITALOPRAM OXALATE 20 MG PO TABS
20.0000 mg | ORAL_TABLET | Freq: Every day | ORAL | 1 refills | Status: DC
Start: 2021-09-10 — End: 2021-12-20

## 2021-09-10 MED ORDER — ARIPIPRAZOLE 5 MG PO TABS
2.5000 mg | ORAL_TABLET | Freq: Every day | ORAL | 1 refills | Status: DC
Start: 2021-09-10 — End: 2021-12-20

## 2021-09-10 MED ORDER — HYDROMORPHONE HCL 2 MG PO TABS: 2.0000 mg | ORAL_TABLET | ORAL | 0 refills | Status: DC | PRN

## 2021-09-10 MED ORDER — PANTOPRAZOLE SODIUM 40 MG PO TBEC: 40.0000 mg | DELAYED_RELEASE_TABLET | Freq: Two times a day (BID) | ORAL | 1 refills | Status: DC

## 2021-09-10 MED ORDER — LISINOPRIL 10 MG PO TABS
10.0000 mg | ORAL_TABLET | Freq: Every day | ORAL | 1 refills | Status: DC
Start: 2021-09-10 — End: 2021-12-20

## 2021-09-10 MED ORDER — BUSPIRONE HCL 5 MG PO TABS
5.0000 mg | ORAL_TABLET | Freq: Two times a day (BID) | ORAL | 1 refills | Status: DC
Start: 2021-09-10 — End: 2021-12-20

## 2021-09-10 MED ORDER — ONDANSETRON 4 MG PO TBDP: 4.0000 mg | ORAL_TABLET | ORAL | 2 refills | Status: DC | PRN
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
## 2021-09-10 NOTE — Patient Instructions (Signed)
Refer to general surgery.   Don't change your meds for now.  Take care.  Glad to see you.

## 2021-09-12 NOTE — Assessment & Plan Note (Signed)
His right lower back pain is better.  There is a consideration of having additional spinal surgery after he gets his BMI lower.  Discussed bariatric surgery referral.  Discussed anatomy related to lap band, gastric sleeve, and gastric bypass.  I need bariatric clinic input.  Referral placed.  Would continue with his pain medication as is for now.  He is not sedated.  The goal is to continue with his pacer as is and eventually have potentially corrective lumbar surgery.  Still okay for outpatient follow-up.

## 2021-09-12 NOTE — Assessment & Plan Note (Signed)
Likely multifactorial issue with pain and weight both contributing.  Recheck blood pressure improved.  Goal is to work on gradual weight reduction.

## 2021-09-13 ENCOUNTER — Telehealth: Payer: Self-pay | Admitting: Family Medicine

## 2021-09-13 MED ORDER — ZOLPIDEM TARTRATE 10 MG PO TABS: ORAL_TABLET | ORAL | 0 refills | Status: DC

## 2021-09-13 MED ORDER — HYDROMORPHONE HCL 2 MG PO TABS: 2.0000 mg | ORAL_TABLET | ORAL | 0 refills | Status: DC | PRN

## 2021-09-13 NOTE — Telephone Encounter (Signed)
[place] Night - Client TELEPHONE ADVICE RECORD [facility] Patient Name: Casey Caldwell [place] Gender: Male DOB: 01-26-1980 Age: 41 Y 4 M 10 D Return Phone Number: [phone] (Primary) Address: City/ State/ Zip: [place]  [address] Client [place] Night - Client Client Site [place] - Night Provider Renford Dills - MD Contact Type Call Who Is Calling Patient / Member / Family / Caregiver Call Type Triage / Clinical Relationship To Patient Self Return Phone Number [phone] (Primary) Chief Complaint Prescription Refill or Medication Request (non symptomatic) Reason for Call Medication Question / Request Initial Comment Caller states on of his Rx. needs to be sent to another pharmacy due the pharmacy being out of stock. Translation No Nurse Assessment Nurse: Hendricks Limes, RN, Kennyth Arnold Date/Time (Eastern Time): 09/11/2021 12:39:48 AM Please select the assessment type ---RX called in but not at pharm Additional Documentation ---Caller states rx was out of stock at pharmacy Document the name of the medication. ---hydromorphone Pharmacy name and phone number. ---[facility] [phone] Has the office closed within the last 30 minutes? ---No Does the client directives allow for assistance with medications after hours? ---No Nurse: Hendricks Limes, RN, Kennyth Arnold Date/Time (Eastern Time): 09/11/2021 12:36:57 AM Confirm and document reason for call. If symptomatic, describe symptoms. ---Caller states hydromorphone was out of stock at pharmacy. Caller states [facility] in [place] has the rx in [phone]. Caller reports no symptoms now that he needs help with. Does the patient have any new or worsening symptoms? ---No Please document clinical information provided and list any resource used. ---Advised caller to call office when open for refill, and to call us back if he needs help with  symptoms. Caller verbalized understanding. Disp. Time Eilene Ghazi Time) Disposition Final User 09/10/2021 9:59:52 PM Send To Nurse Ferne Coe, RN, Maudry Mayhew 09/10/2021 10:01:38 PM Attempt made - no message left Ferdinand Lango, RN, Kenney Houseman PLEASE NOTE: All timestamps contained within this report are represented as [place] Standard Time. CONFIDENTIALTY NOTICE: This fax transmission is intended only for the addressee. It contains information that is legally privileged, confidential or otherwise protected from use or disclosure. If you are not the intended recipient, you are strictly prohibited from reviewing, disclosing, copying using or disseminating any of this information or taking any action in reliance on or regarding this information. If you have received this fax in error, please notify us immediately by telephone so that we can arrange for its return to [place]. Phone: [phone], Toll-Free: [phone], Fax: [phone] Page: 2 of 2 Call Id: [id number] [place]. Time Eilene Ghazi Time) Disposition Final User 09/10/2021 10:18:39 PM FINAL ATTEMPT MADE - no message left Evalee Mutton 09/10/2021 10:18:45 PM Send to RN Final Attempt Colin Rhein, RN, Tonya 09/11/2021 12:18:44 AM Attempt made - message left Hendricks Limes, RN, Kennyth Arnold 09/11/2021 12:41:01 AM Clinical Call Yes Hendricks Limes, RN, Kennyth Arnold Final Disposition 09/11/2021 12:41:01 AM Clinical Call Yes Hendricks Limes, RN, Kennyth Arnold Comments User: Rayne Du, RN Date/Time Eilene Ghazi Time): 09/10/2021 10:01:53 PM VM full, unable to leave ms    [place] Patient Name: Casey Caldwell [place] Gender: Male DOB: 03/12/1980 Age: 41 Y 4 M 10 D Return Phone Number: [phone] (Primary) Address: City/ State/ Zip: [place]  [address] Client [place] Night - Client Client Site [place] Provider Renford Dills - MD Contact  Type Call Who Is Calling Patient / Member / Family /  Caregiver Call Type Triage / Clinical Caller Name Casey Caldwell Relationship To Patient Self Return Phone Number [phone] (Primary) Chief Complaint Prescription Refill or Medication Request (non symptomatic) Reason for Call Medication Question / Request Initial Comment Caller states he was to have his pain medication called in yesterday. His pharmacy is out of stock on it. He requested to have the prescription of Hydromorphone '2mg'$  transferred to another pharmacy. Translation No Nurse Assessment Nurse: Gareth Eagle, RN, Raquel Sarna Date/Time Eilene Ghazi Time): 09/11/2021 12:04:45 PM Confirm and document reason for call. If symptomatic, describe symptoms. ---Caller states his usual pharmacy is out of hydromorphone. He is requesting RX be sent to different pharmacy if possible. States he gets RX's monthly. Does the patient have any new or worsening symptoms? ---No Nurse: Gareth Eagle, RN, Raquel Sarna Date/Time (Eastern Time): 09/11/2021 12:05:30 PM Please select the assessment type ---Request for controlled medication refill Is there an on-call physician for the client? ---Yes Do the client directives specifically allow for paging the on-call regarding scheduled drugs? ---No Additional Documentation ---Advised caller that RX's are unable to be handled after hours. Informed him RN will send info to office and requested he call back Monday during office hrs. Disp. Time Eilene Ghazi Time) Disposition Final User 09/11/2021 12:06:23 PM Clinical Call Yes Gareth Eagle RN, Raquel Sarna Final Disposition 09/11/2021 12:06:23 PM Clinical Call Yes Gareth Eagle, RN, Ortencia Kick

## 2021-09-13 NOTE — Telephone Encounter (Signed)
Patient called in stating that his pharmacy was out of HYDROmorphone (DILAUDID) 2 MG tablet. He would like for that and the zolpidem (AMBIEN) 10 MG tablet to be sent over to He would like for this to be sent over to [facility] # [place], [place]. Stated that [facility] didn't have it ready yet. Thank you!

## 2021-09-13 NOTE — Addendum Note (Signed)
Addended by: Tonia Ghent on: 09/13/2021 02:02 PM   Modules accepted: Orders

## 2021-09-13 NOTE — Telephone Encounter (Signed)
Sent. Thanks.   

## 2021-09-13 NOTE — Telephone Encounter (Signed)
Patient stated that his pharmacy was out of HYDROmorphone (DILAUDID) 2 MG tablet. He would like for that and the zolpidem (AMBIEN) 10 MG tablet to be sent over to [facility] # [place], [place]. Stated that [facility] didn't have it yet.

## 2021-09-13 NOTE — Telephone Encounter (Signed)
Patient has been notified rxs were sent to [facility].

## 2021-09-13 NOTE — Telephone Encounter (Signed)
Please see note below access nurse notes. Sending note to [facility].

## 2021-09-15 ENCOUNTER — Ambulatory Visit (INDEPENDENT_AMBULATORY_CARE_PROVIDER_SITE_OTHER): Payer: 59 | Admitting: Urology

## 2021-09-15 ENCOUNTER — Encounter: Payer: Self-pay | Admitting: Urology

## 2021-09-15 ENCOUNTER — Ambulatory Visit (HOSPITAL_COMMUNITY)
Admission: RE | Admit: 2021-09-15 | Discharge: 2021-09-15 | Disposition: A | Discharge status: Home / Self Care | Payer: 59 | Source: Ambulatory Visit | Attending: Urology | Admitting: Urology

## 2021-09-15 VITALS — BP 162/125 | HR 99

## 2021-09-15 DIAGNOSIS — N200 Calculus of kidney: Secondary | ICD-10-CM | POA: Insufficient documentation

## 2021-09-15 MED ORDER — ALLOPURINOL 300 MG PO TABS: 300.0000 mg | ORAL_TABLET | Freq: Every day | ORAL | 3 refills | Status: DC

## 2021-09-15 MED ORDER — POTASSIUM CITRATE ER 10 MEQ (1080 MG) PO TBCR: 10.0000 meq | EXTENDED_RELEASE_TABLET | Freq: Three times a day (TID) | ORAL | 11 refills | Status: DC

## 2021-09-15 NOTE — Progress Notes (Signed)
09/15/2021 9:44 AM   Casey Caldwell [birth year] [id number]  Referring provider: Tonia Ghent, MD [place],  [place] [address]   nephrolithiasis  HPI: [name] is a 41yo here for followup for nephrolithiasis. He had a stone event 08/03/2021. He has 2 small left renal calculi. He is on urocitK 55mq BID and alloupurinol '300mg'$  daily. No flank pain currently. No significant LUTS.    PMH: Past Medical History:  Diagnosis Date   Chronic pain syndrome    Depression    and panic/anxiety   GERD (gastroesophageal reflux disease)    History of kidney stones    Insomnia    Kidney stones    Seizures ([facility]    Surgical History: Past Surgical History:  Procedure Laterality Date   APPENDECTOMY  2011   CHOLECYSTECTOMY, LAPAROSCOPIC     CYSTOSCOPY W/ URETERAL STENT PLACEMENT Right 01/27/2020   Procedure: CYSTOSCOPY WITH RETROGRADE PYELOGRAM/URETERAL STENT PLACEMENT;  Surgeon: WCeasar Mons MD;  Location: [facility];  Service: Urology;  Laterality: Right;   CYSTOSCOPY/URETEROSCOPY/HOLMIUM LASER Left 06/15/2017   Procedure: CYSTOSCOPY/LEFT URETEROSCOPYSTONE EXTRACTION/LEFT RETROGRADE;  Surgeon: DFranchot Gallo MD;  Location: [facility];  Service: Urology;  Laterality: Left;  With STENT   HYDROCELE EXCISION Left 06/2012   LAPAROSCOPIC CHOLECYSTECTOMY  1996   LITHOTRIPSY     four times over the years; most recent 10/08/12   ORCHIECTOMY Left 11/2012   SPINAL CORD STIMULATOR INSERTION  2015   Subsequently removed and replaced.   SPINAL CORD STIMULATOR INSERTION  2023   [employer] Plus 5.   TONSILLECTOMY  1992   URETEROSCOPY WITH HOLMIUM LASER LITHOTRIPSY Right 07/22/2020   Procedure: CYSTOSCOPY, RIGHT RETROGRADE PYLEOGRAM, RIGHT URETEROSCOPY WITH HOLMIUM LASER LITHOTRIPSY, RIGHT URETERAL STENT;  Surgeon: MAlexis Frock MD;  Location: [facility];  Service: Urology;  Laterality: Right;   WRIST GANGLION EXCISION  2002   Right    Home Medications:   Allergies as of 09/15/2021       Reactions   Gabapentin    Intolerant, sedation    Oxycodone    Intolerant but tolerated vicodin.    Phenergan [promethazine Hcl]    Tape Hives   Adhesive tape   Toradol [ketorolac Tromethamine]    "feels like I am going to crawl out of my skin"   Trileptal [oxcarbazepine] Other (See Comments)   Profound insomnia, worsening mood.    Wellbutrin [bupropion]    H/o SZ d/o.         Medication List        Accurate as of September 15, 2021  9:44 AM. If you have any questions, ask your nurse or doctor.          allopurinol 300 MG tablet Commonly known as: ZYLOPRIM Take 1 tablet (300 mg total) by mouth daily.   ARIPiprazole 5 MG tablet Commonly known as: ABILIFY Take 0.5 tablets (2.5 mg total) by mouth daily.   busPIRone 5 MG tablet Commonly known as: BUSPAR Take 1 tablet (5 mg total) by mouth 2 (two) times daily.   colchicine 0.6 MG tablet TAKE ONE TABLET BY MOUTH TWICE A DAY AS NEEDED FOR GOUT FLARE   docusate sodium 100 MG capsule Commonly known as: COLACE Take 1 capsule (100 mg total) by mouth 2 (two) times daily as needed for mild constipation.   escitalopram 20 MG tablet Commonly known as: LEXAPRO Take 1 tablet (20 mg total) by mouth daily.   HYDROcodone-acetaminophen 5-325 MG tablet Commonly known as: NORCO/VICODIN  Take 1-2 tablets by mouth every 6 (six) hours as needed for severe pain (8 tabs or less per day).   HYDROmorphone 2 MG tablet Commonly known as: DILAUDID Take 1 tablet (2 mg total) by mouth every 4 (four) hours as needed for severe pain (for kidney stones or breakthrough pain).   indapamide 2.5 MG tablet Commonly known as: LOZOL Take 1 tablet (2.5 mg total) by mouth daily.   lisinopril 10 MG tablet Commonly known as: ZESTRIL Take 1 tablet (10 mg total) by mouth daily.   loratadine 10 MG tablet Commonly known as: CLARITIN Take 1 tablet (10 mg total) by mouth daily as needed for allergies.   Melatonin 10 MG  Tabs Take 5-10 mg by mouth at bedtime.   nystatin cream Commonly known as: MYCOSTATIN Apply 1 application topically 2 (two) times daily. Use until rash is resolved and then for 2 more days.   ondansetron 4 MG disintegrating tablet Commonly known as: ZOFRAN-ODT Take 1 tablet (4 mg total) by mouth every 4 (four) hours as needed for nausea or vomiting.   pantoprazole 40 MG tablet Commonly known as: PROTONIX Take 1 tablet (40 mg total) by mouth 2 (two) times daily.   polyethylene glycol powder 17 GM/SCOOP powder Commonly known as: GLYCOLAX/MIRALAX MIX 17G IN 4 TO 8 OUNCES OF FLUID AND TAKE TWICE DAILY IF NEEDED   potassium citrate 10 MEQ (1080 MG) SR tablet Commonly known as: Urocit-K 10 Take 1 tablet (10 mEq total) by mouth 3 (three) times daily with meals. What changed:  medication strength how much to take when to take this   tamsulosin 0.4 MG Caps capsule Commonly known as: FLOMAX Take 1 capsule (0.4 mg total) by mouth daily.   triamcinolone cream 0.5 % Commonly known as: KENALOG Apply 1 application topically 2 (two) times daily.   zolpidem 10 MG tablet Commonly known as: AMBIEN TAKE ONE TABLET BY MOUTH EVERY NIGHT AT BEDTIME AS NEEDED FOR SLEEP        Allergies:  Allergies  Allergen Reactions   Gabapentin     Intolerant, sedation    Oxycodone     Intolerant but tolerated vicodin.    Phenergan [Promethazine Hcl]    Tape Hives    Adhesive tape   Toradol [Ketorolac Tromethamine]     "feels like I am going to crawl out of my skin"   Trileptal [Oxcarbazepine] Other (See Comments)    Profound insomnia, worsening mood.    Wellbutrin [Bupropion]     H/o SZ d/o.     Family History: Family History  Problem Relation Age of Onset   Stroke Mother    Liver disease Mother    Irritable bowel syndrome Mother    Kidney disease Mother    Heart disease Father    Colon polyps Father    Colon cancer Neg Hx     Social History:  reports that he has never smoked. He  has never used smokeless tobacco. He reports that he does not currently use alcohol. He reports that he does not use drugs.  ROS: All other review of systems were reviewed and are negative except what is noted above in HPI  Physical Exam: BP (!) 162/125 Comment: Pt states he just went to pcp concerning bp  Pulse 99   Constitutional:  Alert and oriented, No acute distress. HEENT: [place] AT, moist mucus membranes.  Trachea midline, no masses. Cardiovascular: No clubbing, cyanosis, or edema. Respiratory: Normal respiratory effort, no increased work of breathing. GI:  Abdomen is soft, nontender, nondistended, no abdominal masses GU: No CVA tenderness.  Lymph: No cervical or inguinal lymphadenopathy. Skin: No rashes, bruises or suspicious lesions. Neurologic: Grossly intact, no focal deficits, moving all 4 extremities. Psychiatric: Normal mood and affect.  Laboratory Data: Lab Results  Component Value Date   WBC 13.0 (H) 08/03/2021   HGB 17.1 (H) 08/03/2021   HCT 50.0 08/03/2021   MCV 88.3 08/03/2021   PLT 322 08/03/2021    Lab Results  Component Value Date   CREATININE 0.94 08/03/2021    No results found for: "PSA"  No results found for: "TESTOSTERONE"  No results found for: "HGBA1C"  Urinalysis    Component Value Date/Time   COLORURINE YELLOW 08/03/2021 [place] 08/03/2021 1549   APPEARANCEUR Clear 03/05/2021 1438   LABSPEC 1.025 08/03/2021 1549   PHURINE 5.5 08/03/2021 1549   GLUCOSEU NEGATIVE 08/03/2021 1549   HGBUR NEGATIVE 08/03/2021 1549   BILIRUBINUR NEGATIVE 08/03/2021 1549   BILIRUBINUR Negative 03/05/2021 1438   KETONESUR NEGATIVE 08/03/2021 1549   PROTEINUR NEGATIVE 08/03/2021 1549   UROBILINOGEN 1.0 10/20/2014 1820   NITRITE NEGATIVE 08/03/2021 1549   LEUKOCYTESUR NEGATIVE 08/03/2021 1549    Lab Results  Component Value Date   LABMICR See below: 03/05/2021   WBCUA None seen 03/05/2021   LABEPIT None seen 03/05/2021   MUCUS Present  03/05/2021   BACTERIA None seen 03/05/2021    Pertinent Imaging:  Results for orders placed during the hospital encounter of 01/27/20  DG Abdomen 1 View  Narrative CLINICAL DATA:  Nephrolithiasis  EXAM: ABDOMEN - 1 VIEW  COMPARISON:  01/21/2020, CT 01/08/2020  FINDINGS: The previously noted right paraspinal calculus is not as well visualized on the current examination though the region is in part obscured by dorsal column stimulator battery pack. No additional nephro or urolithiasis identified. Normal abdominal gas pattern. Multiple phleboliths within the pelvis. Dorsal column stimulator lead and cholecystectomy clips again noted.  IMPRESSION: No definite nephro or urolithiasis identified though the right paraspinal region is partially obscured by dorsal column stimulator device.   Electronically Signed By: Fidela Salisbury MD On: 01/27/2020 03:34  No results found for this or any previous visit.  No results found for this or any previous visit.  No results found for this or any previous visit.  Results for orders placed during the hospital encounter of 07/21/20  US Renal  Narrative CLINICAL DATA:  41 year old male with right ureteral calculus. Worsening abdominal pain.  EXAM: RENAL / URINARY TRACT ULTRASOUND COMPLETE  COMPARISON:  CT abdomen pelvis dated 07/18/2020.  FINDINGS: Evaluation is limited due to body habitus.  Right Kidney:  Renal measurements: 11.9 x 6.1 x 5.1 cm = volume: 196 mL. Normal echogenicity. There is mild parenchyma atrophy. There is moderate hydronephrosis. No stone identified within the right kidney.  Left Kidney:  Renal measurements: 12.0 x 6.5 x 5.5 cm = volume: 225 mL. Normal echogenicity. Mild parenchyma atrophy. No hydronephrosis or shadowing stone.  Bladder:  The urinary bladder is mildly distended. The left ureteral jet noted. The right ureteral jet is not visualized.  Other:  None.  IMPRESSION: Moderate  right hydronephrosis with absent right ureteral jets. Findings concerning for an obstructing right ureteral calculus.   Electronically Signed By: Anner Crete M.D. On: 07/21/2020 19:13  Results for orders placed during the hospital encounter of 11/15/16  [place] SCROTOM DOPPLER  Narrative CLINICAL DATA:  Groin pain history of left orchiectomy  EXAM: SCROTAL ULTRASOUND  DOPPLER ULTRASOUND  OF THE TESTICLES  TECHNIQUE: Complete ultrasound examination of the testicles, epididymis, and other scrotal structures was performed. Color and spectral Doppler ultrasound were also utilized to evaluate blood flow to the testicles.  COMPARISON:  01/26/2016  FINDINGS: Right testicle  Measurements: 4.6 x 2.3 x 3.1 cm. No mass or microlithiasis visualized.  Left testicle  Surgically absent  Right epididymis:  Normal in size and appearance.  Hydrocele:  None visualized.  Varicocele:  None visualized.  Pulsed Doppler interrogation of both testes demonstrates normal low resistance arterial and venous waveforms bilaterally.  IMPRESSION: 1. Status post left orchiectomy. 2. Normal ultrasound appearance of the right testis with no evidence for torsion or mass   Electronically Signed By: Donavan Foil M.D. On: 11/15/2016 18:35  No results found for this or any previous visit.  Results for orders placed during the hospital encounter of 08/03/21  CT Renal Stone Study  Narrative CLINICAL DATA:  Flank pain.  EXAM: CT ABDOMEN AND PELVIS WITHOUT CONTRAST  TECHNIQUE: Multidetector CT imaging of the abdomen and pelvis was performed following the standard protocol without IV contrast.  RADIATION DOSE REDUCTION: This exam was performed according to the departmental dose-optimization program which includes automated exposure control, adjustment of the mA and/or kV according to patient size and/or use of iterative reconstruction technique.  COMPARISON:  Renal stone CT  05/22/2021  FINDINGS: Lower chest: No acute abnormality.  Hepatobiliary: Diffuse fatty infiltration of the liver is unchanged. The gallbladder is surgically absent. No biliary ductal dilatation.  Pancreas: Unremarkable. No pancreatic ductal dilatation or surrounding inflammatory changes.  Spleen: Normal in size without focal abnormality.  Adrenals/Urinary Tract: Punctate nonobstructing left renal calculi are again seen. No ureteral or bladder calculi are seen. There is no hydronephrosis or perinephric stranding. The bladder is completely decompressed. The adrenal glands are within normal limits.  Stomach/Bowel: Stomach is within normal limits. Appendix appears normal. No evidence of bowel wall thickening, distention, or inflammatory changes. Appendix is not seen.  Vascular/Lymphatic: No significant vascular findings are present. No enlarged abdominal or pelvic lymph nodes.  Reproductive: Prostate is unremarkable.  Other: Small fat containing umbilical hernia.  No ascites.  Musculoskeletal: Thoracic spinal stimulator device present. No acute fractures.  IMPRESSION: 1. No acute abnormality. 2. Stable nonobstructing left renal calculi. 3. Hepatic steatosis.   Electronically Signed By: Ronney Asters M.D. On: 08/03/2021 16:30   Assessment & Plan:    1. Nephrolithiasis -RTC 6 months with renal US - Urinalysis, Routine w reflex microscopic - allopurinol (ZYLOPRIM) 300 MG tablet; Take 1 tablet (300 mg total) by mouth daily.  Dispense: 90 tablet; Refill: 3   No follow-ups on file.  Nicolette Bang, MD  [facility] [place]

## 2021-09-16 LAB — URINALYSIS, ROUTINE W REFLEX MICROSCOPIC
Bilirubin, UA: NEGATIVE
Glucose, UA: NEGATIVE
Ketones, UA: NEGATIVE
Leukocytes,UA: NEGATIVE
Nitrite, UA: NEGATIVE
Protein,UA: NEGATIVE
RBC, UA: NEGATIVE
Specific Gravity, UA: 1.02 (ref 1.005–1.030)
Urobilinogen, Ur: 0.2 mg/dL (ref 0.2–1.0)
pH, UA: 5.5 (ref 5.0–7.5)

## 2021-09-30 ENCOUNTER — Encounter: Payer: Self-pay | Admitting: Family Medicine

## 2021-10-04 ENCOUNTER — Other Ambulatory Visit: Payer: Self-pay | Admitting: Family Medicine

## 2021-10-04 MED ORDER — ONDANSETRON 8 MG PO TBDP: 8.0000 mg | ORAL_TABLET | Freq: Three times a day (TID) | ORAL | 2 refills | Status: DC | PRN

## 2021-10-11 ENCOUNTER — Telehealth: Payer: Self-pay

## 2021-10-11 NOTE — Telephone Encounter (Signed)
[place] Night - Client TELEPHONE ADVICE RECORD [facility] Patient Name: Casey Caldwell [place] Gender: Male DOB: 1981/01/19 Age: 41 Y 42 M 7 D Return Phone Number: [phone] (Primary), [phone] (Secondary) Address: City/ State/ Zip: [place] [address] Client [place] Night - Client Client Site [place] - Night Provider Renford Dills - MD Contact Type Call Who Is Calling Patient / Member / Family / Caregiver Call Type Triage / Clinical Relationship To Patient Self Return Phone Number [phone] (Primary) Chief Complaint Prescription Refill or Medication Request (non symptomatic) Reason for Call Medication Question / Request Initial Comment Caller states he is currently out of his pain medication and is currently in [place]. Translation No Nurse Assessment Nurse: Ysidro Evert, RN, Levada Dy Date/Time (Eastern Time): 10/09/2021 9:15:35 AM Confirm and document reason for call. If symptomatic, describe symptoms. ---Caller states he is having ongoing back pain Does the patient have any new or worsening symptoms? ---Yes Will a triage be completed? ---Yes Related visit to physician within the last 2 weeks? ---No Does the PT have any chronic conditions? (i.e. diabetes, asthma, this includes High risk factors for pregnancy, etc.) ---No Is this a behavioral health or substance abuse call? ---No Guidelines Guideline Title Affirmed Question Affirmed Notes Nurse Date/Time (Eastern Time) Back Pain [1] SEVERE back pain (e.g., excruciating, unable to do any normal activities) AND [2] not improved 2 hours after pain medicine Ysidro Evert, RN, Levada Dy 10/09/2021 9:16:21 AM Disp. Time Eilene Ghazi Time) Disposition Final User 10/09/2021 9:21:19 AM See HCP within 4 Hours (or PCP triage) Yes Ysidro Evert, RN, Levada Dy PLEASE NOTE: All timestamps contained within this report are represented as [place] Standard Time. CONFIDENTIALTY  NOTICE: This fax transmission is intended only for the addressee. It contains information that is legally privileged, confidential or otherwise protected from use or disclosure. If you are not the intended recipient, you are strictly prohibited from reviewing, disclosing, copying using or disseminating any of this information or taking any action in reliance on or regarding this information. If you have received this fax in error, please notify us immediately by telephone so that we can arrange for its return to [place]. Phone: [phone], Toll-Free: [phone], Fax: [phone] Page: 2 of 2 Call Id: [id number] Final Disposition 10/09/2021 9:21:19 AM See HCP within 4 Hours (or PCP triage) Yes Ysidro Evert, RN, Marin Shutter Disagree/Comply Comply Caller Understands Yes PreDisposition Did not know what to do Care Advice Given Per Guideline SEE HCP (OR PCP TRIAGE) WITHIN 4 HOURS: * IF OFFICE WILL BE OPEN: You need to be seen within the next 3 or 4 hours. Call your doctor (or NP/PA) now or as soon as the office opens. CALL BACK IF: * You become worse CARE ADVICE given per Back Pain (Adult) guideline. Referrals GO TO FACILITY UNDECIDE

## 2021-10-11 NOTE — Telephone Encounter (Signed)
Unable to reach pt by phone to see which pain med pt is requesting to be refilled and to see if pt has returned to his home and what pharmacy pt using. Sending note to [facility].

## 2021-10-12 ENCOUNTER — Telehealth: Payer: Self-pay

## 2021-10-12 ENCOUNTER — Other Ambulatory Visit: Payer: Self-pay | Admitting: Family Medicine

## 2021-10-12 DIAGNOSIS — M4306 Spondylolysis, lumbar region: Secondary | ICD-10-CM | POA: Insufficient documentation

## 2021-10-12 DIAGNOSIS — M542 Cervicalgia: Secondary | ICD-10-CM | POA: Insufficient documentation

## 2021-10-12 MED ORDER — HYDROCODONE-ACETAMINOPHEN 5-325 MG PO TABS: 1.0000 | ORAL_TABLET | Freq: Four times a day (QID) | ORAL | 0 refills | Status: DC | PRN

## 2021-10-12 MED ORDER — HYDROMORPHONE HCL 2 MG PO TABS: 2.0000 mg | ORAL_TABLET | ORAL | 0 refills | Status: DC | PRN

## 2021-10-12 NOTE — Telephone Encounter (Signed)
Spoke with patient and he is now home from [facility]. Patient needs his hydrocodone and hydromorphone refilled.  LOV - 09/10/21 NOV - NA RF - hydrocodone 09/10/21 #240/0         Hyrdomorphone 09/13/21 #30/0

## 2021-10-12 NOTE — Telephone Encounter (Signed)
Sent today.  Thanks.

## 2021-10-12 NOTE — Telephone Encounter (Signed)
I tried starting a prior auth for Hydrocodone-Acetaminophen 5-325 mg tablets.  On Cover my meds, it states they cannot find matching patient for his insurance.  I called [facility] to see if they have updated ins info.  Per Christen Bame at [facility], no PA is needed.  The Rx originally said it needed a PA, but 10 minutes late it went through.  Prescription is ready for patient to pick up.  Patient notified via mychart.

## 2021-10-20 ENCOUNTER — Encounter: Payer: Self-pay | Admitting: Family Medicine

## 2021-11-05 ENCOUNTER — Encounter: Payer: Self-pay | Admitting: Family Medicine

## 2021-11-05 NOTE — Telephone Encounter (Signed)
Refill request for Hydrocodone and Hydromorphone   LOV - 09/10/21 Next OV - not scheduled Last refill - 10/12/21 on both

## 2021-11-07 ENCOUNTER — Other Ambulatory Visit: Payer: Self-pay | Admitting: Family Medicine

## 2021-11-07 MED ORDER — HYDROCODONE-ACETAMINOPHEN 5-325 MG PO TABS: 1.0000 | ORAL_TABLET | Freq: Four times a day (QID) | ORAL | 0 refills | Status: DC | PRN

## 2021-11-07 MED ORDER — HYDROMORPHONE HCL 2 MG PO TABS: 2.0000 mg | ORAL_TABLET | ORAL | 0 refills | Status: DC | PRN

## 2021-11-08 ENCOUNTER — Telehealth: Payer: Self-pay | Admitting: Family Medicine

## 2021-11-08 NOTE — Telephone Encounter (Signed)
Please make sure the letter printed so I can sign.  And please send with other forms.  Thanks.

## 2021-11-08 NOTE — Telephone Encounter (Signed)
[place] called on behalf on patient and his pain medication:  Diagnosis code How long has patient been seen in our office? His last visit/drug test Is the patient taking anything else for pain management?

## 2021-11-08 NOTE — Telephone Encounter (Signed)
Called and spoke with [facility] and answered all the questions they had.

## 2021-11-10 ENCOUNTER — Other Ambulatory Visit (HOSPITAL_COMMUNITY): Payer: Self-pay | Admitting: General Surgery

## 2021-11-11 ENCOUNTER — Encounter: Payer: Self-pay | Admitting: Family Medicine

## 2021-11-12 ENCOUNTER — Ambulatory Visit (HOSPITAL_COMMUNITY)
Admission: RE | Admit: 2021-11-12 | Discharge: 2021-11-12 | Disposition: A | Discharge status: Home / Self Care | Payer: Commercial Managed Care - HMO | Source: Ambulatory Visit | Attending: General Surgery | Admitting: General Surgery

## 2021-11-19 ENCOUNTER — Telehealth (INDEPENDENT_AMBULATORY_CARE_PROVIDER_SITE_OTHER): Payer: Commercial Managed Care - HMO | Admitting: Family Medicine

## 2021-11-19 ENCOUNTER — Encounter: Payer: Self-pay | Admitting: Family Medicine

## 2021-11-19 DIAGNOSIS — M549 Dorsalgia, unspecified: Secondary | ICD-10-CM | POA: Diagnosis not present

## 2021-11-19 DIAGNOSIS — G8929 Other chronic pain: Secondary | ICD-10-CM

## 2021-11-19 DIAGNOSIS — E669 Obesity, unspecified: Secondary | ICD-10-CM | POA: Diagnosis not present

## 2021-11-19 DIAGNOSIS — J069 Acute upper respiratory infection, unspecified: Secondary | ICD-10-CM

## 2021-11-19 NOTE — Progress Notes (Unsigned)
Interactive audio and video telecommunications were attempted between this provider and patient, however failed, due to patient having technical difficulties OR patient did not have access to video capability.  We continued and completed visit with audio only.   Virtual Visit via Telephone Note  I connected with patient on 11/19/21  at 11:16 AM  by telephone and verified that I am speaking with the correct person using two identifiers.  Location of patient: home   Location of MD: [place] Name of referring provider (if blank then none associated): Names per persons and role in encounter:  MD: Earlyne Iba, Patient: name listed above.    I discussed the limitations, risks, security and privacy concerns of performing an evaluation and management service by telephone and the availability of in person appointments. I also discussed with the patient that there may be a patient responsible charge related to this service. The patient expressed understanding and agreed to proceed.  CC: follow up.    History of Present Illness:   He had psychology eval, upper GI imaging.  Labs pending and then nutrition eval.    Back pain d/w pt.  Similar to prev.  He is trying to get gastric surgery to help with f/u back surgery.  He is trying to be as active as pain will allow.  His disability eval is still pending, awaiting response.  Still in pain, in spite of meds, but manageable as is.  Not sedated.  We talked about potentially having to change dose of meds to allow for gastric surgery, but not likely change total dose per day, ie to dec number of pills per day.    He is going to move to [facility] to live with his step sister.  They get along well.    URI sx, exposure about 1 week ago.  ST, aches, fatigue.  Congested.  ST is better now.  Still with hacking cough.  No fevers.  He hasn't had a covid test yet.  He feels better in the meantime.  He is getting better in the meantime.  He can still take  a deep breath.     Observations/Objective:***    Assessment and Plan:***    Follow Up Instructions: see above.      I discussed the assessment and treatment plan with the patient. The patient was provided an opportunity to ask questions and all were answered. The patient agreed with the plan and demonstrated an understanding of the instructions.   The patient was advised to call back or seek an in-person evaluation if the symptoms worsen or if the condition fails to improve as anticipated.  I provided *** minutes of non-face-to-face time during this encounter.   Elsie Stain, MD

## 2021-11-21 DIAGNOSIS — E669 Obesity, unspecified: Secondary | ICD-10-CM | POA: Insufficient documentation

## 2021-11-21 NOTE — Assessment & Plan Note (Signed)
  Back pain.  He is trying to get gastric surgery to help with his weight so he can have follow-up back surgery.  He is going to update me if he is going to need to decrease his pill count due to gastric surgery.  Not sedated.  Still okay for outpatient follow-up.  He is awaiting his disability hearing.  He is going to move in with his stepsister.

## 2021-11-21 NOTE — Assessment & Plan Note (Signed)
Obesity discussed plan for upcoming bariatric surgery.  He had psychology eval, upper GI imaging.  He has nutrition eval pending.  He will follow-up with surgery clinic after that.

## 2021-11-21 NOTE — Assessment & Plan Note (Signed)
   URI symptoms, improving in the meantime.  Supportive care.  He will update me as needed.  He agrees to plan.

## 2021-11-21 NOTE — Addendum Note (Signed)
Addended by: Tonia Ghent on: 11/21/2021 02:32 PM   Modules accepted: Level of Service

## 2021-11-23 ENCOUNTER — Encounter: Payer: Self-pay | Admitting: Dietician

## 2021-11-23 ENCOUNTER — Encounter: Payer: Commercial Managed Care - HMO | Attending: General Surgery | Admitting: Dietician

## 2021-11-23 DIAGNOSIS — Z6841 Body Mass Index (BMI) 40.0 and over, adult: Secondary | ICD-10-CM | POA: Insufficient documentation

## 2021-11-23 DIAGNOSIS — E669 Obesity, unspecified: Secondary | ICD-10-CM

## 2021-11-23 DIAGNOSIS — Z713 Dietary counseling and surveillance: Secondary | ICD-10-CM | POA: Insufficient documentation

## 2021-11-23 NOTE — Progress Notes (Signed)
Nutrition Assessment for Bariatric Surgery Medical Nutrition Therapy Appt Start Time: 9:10    End Time: 10:20  Patient was seen on 11/23/2021 for Pre-Operative Nutrition Assessment. Letter of approval faxed to [facility] bariatric surgery program coordinator on 11/23/2021.   Referral stated Supervised Weight Loss (SWL) visits needed: 0  Pt completed visits.   Pt has cleared nutrition requirements.   Planned surgery: RYGB   NUTRITION ASSESSMENT   Anthropometrics  Start weight at [facility]: 389.4 lbs (date: 11/23/2021)  Height: 71.5 in BMI: 53.55 kg/m2     Clinical  Medical hx: Spinal stenosis with chronic pain syndrome, hypertension, GERD, hepatic steatosis, history of kidney stones, gout Medications: Hydrocodone, hydromorphone, Zofran, pot citrate, xyloprim, ambion, abilify, buspirone, Vit D  Labs: A1C 6.7; Vit D Notable signs/symptoms: none noted Any previous deficiencies? No  Micronutrient Nutrition Focused Physical Exam: Hair: No issues observed Eyes: No issues observed Mouth: No issues observed Neck: No issues observed Nails: No issues observed Skin: No issues observed  Lifestyle & Dietary Hx  Patient lives with his ex-wife. Mr. Patient and ex-wife performs the food shopping and prepares the meals. He reports that he typically skips or misses 7 out of 21 possible meals per week. He may have 7 meals per week that are take-out or at a restaurant.  Patient does not work. He admits to binge eating and has felt shame and/or guilt after eating too much food.  He denies having used laxatives or vomiting to facilitate weight loss. He admits to emotional eating during times of stress. He states that he knows the difference between hunger and thirst and can tell when he is full. Pt states he is on SNAP for food, stating after surgery he is hoping he will not have to spend as much money on food.  Pt states he is living with ex-wife rent free, and tries to help with the  groceries.  Pt states he is hoping a job with a [employer] begins soon. Pt states he is on opioids and doesn't always care about dieting, stating he is motivated by trying to get rid of the pain. Pt states bending over the counter in the kitchen causes him pain, so he doesn't like to eat meals that take a lot of prep Pt states he has a prescription for Vit D, stating he will start to take it when it gets filled.   Physical Activity: ADLs, pt states he can only do so much and then his cane comes out.   Sleep Hygiene: duration and quality: 50/50, sometimes a full nights rest and sometimes, getting up at night several times.  Current Patient Perceived Stress Level as stated by pt on a scale of 1-10: 7      Stress Management Techniques: breathing, games (video, card, board).  Fruit servings per week: 10 Non starchy vegetable servings per week: 10 Whole Grains per week: 10   24-Hr Dietary Recall First Meal: skip Snack: bagel with cream cheese Second Meal: soup, beef stew Snack: twixt or energy bar, grain bar Third Meal: Fast Food Snack: rice, soup, beef stew Beverages: green tea, water, zero calories drinks  Alcoholic beverages per week: 0   Estimated Energy Needs Calories: 2000   NUTRITION DIAGNOSIS  Overweight/obesity ([place]-3.3) related to past poor dietary habits and physical inactivity as evidenced by patient w/ planned RYGB surgery following dietary guidelines for continued weight loss.    NUTRITION INTERVENTION  Nutrition counseling (C-1) and education (E-2) to facilitate bariatric surgery goals.  Educated pt on micronutrient deficiencies post surgery and strategies to mitigate that risk   Pre-Op Goals Reviewed with the Patient Track food and beverage intake (pen and paper, MyFitness Pal, Baritastic app, etc.) Make healthy food choices while monitoring portion sizes Consume 3 meals per day or try to eat every 3-5 hours Avoid concentrated sugars and fried foods Keep  sugar & fat in the single digits per serving on food labels Practice CHEWING your food (aim for applesauce consistency) Practice not drinking 15 minutes before, during, and 30 minutes after each meal and snack Avoid all carbonated beverages (ex: soda, sparkling beverages)  Limit caffeinated beverages (ex: coffee, tea, energy drinks) Avoid all sugar-sweetened beverages (ex: regular soda, sports drinks)  Avoid alcohol  Aim for 64-100 ounces of FLUID daily (with at least half of fluid intake being plain water)  Aim for at least 60-80 grams of PROTEIN daily Look for a liquid protein source that contains ?15 g protein and ?5 g carbohydrate (ex: shakes, drinks, shots) Make a list of non-food related activities Physical activity is an important part of a healthy lifestyle so keep it moving! The goal is to reach 150 minutes of exercise per week, including cardiovascular and weight baring activity.  *Goals that are bolded indicate the pt would like to start working towards these  Handouts Provided Include  Bariatric Surgery handouts (Nutrition Visits, Pre-Op Goals, Protein Shakes, Vitamins & Minerals)  Learning Style & Readiness for Change Teaching method utilized: Visual & Auditory  Demonstrated degree of understanding via: Teach Back  Readiness Level: Preparation Barriers to learning/adherence to lifestyle change: pain and mobility  RD's Notes for Next Visit     MONITORING & EVALUATION Dietary intake, weekly physical activity, body weight, and pre-op goals reached at next nutrition visit.    Next Steps  Pt has completed visits. No further supervised visits required/recomended  Patient is to follow up at [facility] for Pre-Op Class >2 weeks before surgery for further nutrition education.

## 2021-12-09 ENCOUNTER — Telehealth: Payer: Self-pay | Admitting: Family Medicine

## 2021-12-09 NOTE — Telephone Encounter (Signed)
Patient called in and stated he started to have a bulge in his belly button and considered it may be a hernia. He stated that he isn't sure if its due to his surgery he is having soon or not. Please advise. Thank you!

## 2021-12-10 NOTE — Telephone Encounter (Signed)
LMTCB

## 2021-12-12 ENCOUNTER — Encounter: Payer: Self-pay | Admitting: Family Medicine

## 2021-12-13 ENCOUNTER — Encounter: Payer: Commercial Managed Care - HMO | Attending: General Surgery | Admitting: Skilled Nursing Facility1

## 2021-12-13 VITALS — Wt 392.4 lb

## 2021-12-13 DIAGNOSIS — E669 Obesity, unspecified: Secondary | ICD-10-CM | POA: Insufficient documentation

## 2021-12-13 NOTE — Telephone Encounter (Signed)
See my chart message

## 2021-12-14 ENCOUNTER — Other Ambulatory Visit: Payer: Self-pay | Admitting: Family Medicine

## 2021-12-14 ENCOUNTER — Encounter: Payer: Self-pay | Admitting: Skilled Nursing Facility1

## 2021-12-14 DIAGNOSIS — G8929 Other chronic pain: Secondary | ICD-10-CM

## 2021-12-14 MED ORDER — HYDROCODONE-ACETAMINOPHEN 5-325 MG PO TABS: 1.0000 | ORAL_TABLET | Freq: Four times a day (QID) | ORAL | 0 refills | Status: DC | PRN

## 2021-12-14 MED ORDER — HYDROMORPHONE HCL 2 MG PO TABS: 2.0000 mg | ORAL_TABLET | ORAL | 0 refills | Status: DC | PRN

## 2021-12-14 MED ORDER — ZOLPIDEM TARTRATE 10 MG PO TABS: ORAL_TABLET | ORAL | 0 refills | Status: DC

## 2021-12-14 NOTE — Telephone Encounter (Signed)
Resent.  Thanks.

## 2021-12-14 NOTE — Telephone Encounter (Signed)
Rx should be okay with the sig as is.  He has h/o recurrent renal stones over the years and has used hydromorphone for both breakthrough and renal stone pain.  Thanks.

## 2021-12-14 NOTE — Telephone Encounter (Addendum)
[facility] called and wants to know if the Hydrocodone and Hydromorphone are still being prescribed for a kidney stone and breakthrough pain as stated in the description. They are concerned that this has been the same description used for several months. If this is not accurate they are requesting it be changed to reflect an updated reason for why he takes both of these medications.  Please advise

## 2021-12-14 NOTE — Progress Notes (Signed)
Pre-Operative Nutrition Class:    Patient was seen on 12/13/2021 for Pre-Operative Bariatric Surgery Education [facility]    Surgery date: 01/11/2022 Surgery type: RYGB Start weight at [facility]: 389.4 Weight today: 392.4  Samples given per MNT protocol. Patient educated on appropriate usage: [place] Lot # [id number] Exp: 9/26   Bariatric Advantage Calcium  Lot # [id number] Exp:12/14/2021   Ensure Max Protein Shake Lot # [id number] Exp: [id number]  The following the learning objectives were met by the patient during this course: Identify Pre-Op Dietary Goals and will begin 2 weeks pre-operatively Identify appropriate sources of fluids and proteins  State protein recommendations and appropriate sources pre and post-operatively Identify Post-Operative Dietary Goals and will follow for 2 weeks post-operatively Identify appropriate multivitamin and calcium sources Describe the need for physical activity post-operatively and will follow MD recommendations State when to call healthcare provider regarding medication questions or post-operative complications When having a diagnosis of diabetes understanding hypoglycemia symptoms and the inclusion of 1 complex carbohydrate per meal  Handouts given during class include: Pre-Op Bariatric Surgery Diet Handout Protein Shake Handout Post-Op Bariatric Surgery Nutrition Handout BELT Program Information Flyer Support Group Information Flyer [facility] Bariatric Supplements Price List  Follow-Up Plan: Patient will follow-up at [facility] 2 weeks post operatively for diet advancement per MD.

## 2021-12-14 NOTE — Telephone Encounter (Signed)
[facility]  called in requesting a call back regarding pt pain medication need to confirm . Please advise #  [phone]

## 2021-12-14 NOTE — Telephone Encounter (Signed)
Spoke with Dr. Damita Dunnings.  Although patient has history of many kidney stones, he will change Dx to Chronic back pain and resend in prescriptions to [facility].

## 2021-12-20 ENCOUNTER — Encounter: Payer: Self-pay | Admitting: Family Medicine

## 2021-12-20 ENCOUNTER — Telehealth (INDEPENDENT_AMBULATORY_CARE_PROVIDER_SITE_OTHER): Payer: Commercial Managed Care - HMO | Admitting: Family Medicine

## 2021-12-20 VITALS — Ht 71.5 in | Wt 295.0 lb

## 2021-12-20 DIAGNOSIS — E119 Type 2 diabetes mellitus without complications: Secondary | ICD-10-CM | POA: Diagnosis not present

## 2021-12-20 DIAGNOSIS — G8929 Other chronic pain: Secondary | ICD-10-CM

## 2021-12-20 DIAGNOSIS — I10 Essential (primary) hypertension: Secondary | ICD-10-CM | POA: Diagnosis not present

## 2021-12-20 MED ORDER — PANTOPRAZOLE SODIUM 40 MG PO TBEC: 40.0000 mg | DELAYED_RELEASE_TABLET | Freq: Two times a day (BID) | ORAL | 1 refills | Status: DC

## 2021-12-20 MED ORDER — LORATADINE 10 MG PO TABS: 10.0000 mg | ORAL_TABLET | Freq: Every day | ORAL | 2 refills | Status: AC | PRN

## 2021-12-20 MED ORDER — POLYETHYLENE GLYCOL 3350 17 GM/SCOOP PO POWD: ORAL | 1 refills | Status: DC

## 2021-12-20 MED ORDER — LISINOPRIL 10 MG PO TABS: 10.0000 mg | ORAL_TABLET | Freq: Every day | ORAL | 1 refills | Status: DC

## 2021-12-20 MED ORDER — BUSPIRONE HCL 5 MG PO TABS: 5.0000 mg | ORAL_TABLET | Freq: Two times a day (BID) | ORAL | 1 refills | Status: DC

## 2021-12-20 MED ORDER — ARIPIPRAZOLE 5 MG PO TABS: 2.5000 mg | ORAL_TABLET | Freq: Every day | ORAL | 1 refills | Status: DC

## 2021-12-20 MED ORDER — ESCITALOPRAM OXALATE 20 MG PO TABS: 20.0000 mg | ORAL_TABLET | Freq: Every day | ORAL | 1 refills | Status: DC

## 2021-12-20 MED ORDER — INDAPAMIDE 2.5 MG PO TABS: 2.5000 mg | ORAL_TABLET | Freq: Every day | ORAL | 1 refills | Status: DC

## 2021-12-20 MED ORDER — COLCHICINE 0.6 MG PO TABS: ORAL_TABLET | ORAL | 1 refills | Status: DC

## 2021-12-20 MED ORDER — ONDANSETRON 8 MG PO TBDP: 8.0000 mg | ORAL_TABLET | Freq: Three times a day (TID) | ORAL | 2 refills | Status: DC | PRN

## 2021-12-20 NOTE — Progress Notes (Unsigned)
Virtual visit completed through WebEx or similar program Patient location: home  Provider location: [place] at [facility], office  Participants: Patient and me (unless stated otherwise below)  Limitations and rationale for visit method d/w patient.  Patient agreed to proceed.   CC: follow up.    HPI: he moved to his sister's house in the meantime.  Sleeping better but with fatigue noted.    DM2 d/w pt. A1c 6.7.  no meds currently.    On vit D replacement.    He has bariatric surgery scheduled for 01/11/22.    He still putting up with B lower back pain, lower abd pain, phantom L testicle pain, R groin pain.  He has been using his pain medication at baseline.  The goal is to get bariatric surgery so then he can have follow-up back surgery when his weight is lower.  He is not sedated.  Meds and allergies reviewed.  ROS: Per HPI unless specifically indicated in ROS section   NAD Speech wnl  A/P: Previously with elevated blood pressure. He can check his BP at home and update me tomorrow.    I will check with the surgery clinic to see if he still needs OV on 12/28/21.  I want to check with the surgery clinic about options regarding his blood pressure/diabetes/pain management.  See following phone note.  He would still need postop/diabetes follow-up here after his surgery.  I think it makes sense to defer metformin and Ozempic for now given his pending surgery.  I do not think either medication would have enough time to make a significant change in his sugar prior to surgery.  We can change his potassium citrate to liquid if needed.  He can cut his hydrocodone tablets in half.  His other pills should be small enough to swallow as is.  I do not think it makes sense to change his chronic pain medication in the meantime.  He agrees.

## 2021-12-21 ENCOUNTER — Telehealth: Payer: Self-pay | Admitting: Family Medicine

## 2021-12-21 DIAGNOSIS — G8929 Other chronic pain: Secondary | ICD-10-CM

## 2021-12-21 MED ORDER — HYDROMORPHONE HCL 2 MG PO TABS: 2.0000 mg | ORAL_TABLET | ORAL | 0 refills | Status: DC | PRN

## 2021-12-21 NOTE — Addendum Note (Signed)
Addended by: Tonia Ghent on: 12/21/2021 05:28 PM   Modules accepted: Orders

## 2021-12-21 NOTE — Telephone Encounter (Signed)
Patient called and stated the medication HYDROmorphone (DILAUDID) 2 MG tablet is on back order and wanted to be sent to another pharmacy that is Casey Caldwell at [facility]. Call back number [phone].

## 2021-12-21 NOTE — Telephone Encounter (Signed)
Sent. Thanks.   

## 2021-12-22 ENCOUNTER — Telehealth: Payer: Self-pay | Admitting: Family Medicine

## 2021-12-22 DIAGNOSIS — E119 Type 2 diabetes mellitus without complications: Secondary | ICD-10-CM | POA: Insufficient documentation

## 2021-12-22 NOTE — Telephone Encounter (Signed)
Please get update from patient about his blood pressure.  Please let me know about that.  ============================  Please update the surgery clinic.  I do not think the patient needs to keep the office visit here on 12/28/21 unless there is something else the surgery team specifically wants me to address.    We can set up a postop visit so we can help manage his diabetes.  I do not think it makes sense to start metformin or Ozempic prior to the surgery because I do not think either would have enough time to make a significant change in his situation.  The patient is going to let us know about his blood pressure in the meantime.  He has chronic pain at baseline.  He should be able to take his hydromorphone by mouth without crushing or cutting that.  He should be able to cut his hydrocodone and then swallow that.    If surgery wants me to change his potassium to liquid then I can take care of that.  If they prefer to handle it then I will defer to them.  I thank all involved.  Please let me know if surgery wants me to address anything, ie his liquid potassium prescription. Thanks.

## 2021-12-22 NOTE — Assessment & Plan Note (Signed)
Previously with elevated blood pressure. He can check his BP at home and update me tomorrow.    I will check with the surgery clinic to see if he still needs OV on 12/28/21.  I want to check with the surgery clinic about options regarding his blood pressure/diabetes/pain management.  See following phone note.  He would still need postop/diabetes follow-up here after his surgery.  I think it makes sense to defer metformin and Ozempic for now given his pending surgery.  I do not think either medication would have enough time to make a significant change in his sugar prior to surgery.  We can change his potassium citrate to liquid if needed.  He can cut his hydrocodone tablets in half.  His other pills should be small enough to swallow as is.  I do not think it makes sense to change his chronic pain medication in the meantime.  He agrees.

## 2021-12-22 NOTE — Telephone Encounter (Signed)
Patient notified rx was sent to [facility]. Patient states he has not been able to get a BP reading and will keep his appt with Dr. Damita Dunnings on 12/28/21.

## 2021-12-22 NOTE — Assessment & Plan Note (Signed)
Continue hydrocodone with as needed hydromorphone in the meantime.

## 2021-12-22 NOTE — Assessment & Plan Note (Signed)
See above.  No change in medications at this point.  We can recheck postop.

## 2021-12-23 NOTE — Telephone Encounter (Signed)
Please update surgery clinic- see if they want me to address his potassium or if they are going to change his meds.  Based on the video visit conversation, I thought he could check his BP.  Will recheck at the [facility].  Thanks.

## 2021-12-23 NOTE — Telephone Encounter (Signed)
Patient can not take BP; has no way to do so. I wrote that in his refill request earlier this week when patient told me. Also he wants to keep his appt on 12/28/21 due to that fact.

## 2021-12-24 NOTE — Telephone Encounter (Signed)
Late entry- I called [facility] around 2:30 pm and LMTCB

## 2021-12-28 ENCOUNTER — Ambulatory Visit (INDEPENDENT_AMBULATORY_CARE_PROVIDER_SITE_OTHER): Payer: Commercial Managed Care - HMO | Admitting: Family Medicine

## 2021-12-28 ENCOUNTER — Encounter: Payer: Self-pay | Admitting: Family Medicine

## 2021-12-28 VITALS — BP 130/90 | HR 100 | Temp 97.8°F | Ht 71.5 in | Wt 394.0 lb

## 2021-12-28 DIAGNOSIS — I10 Essential (primary) hypertension: Secondary | ICD-10-CM | POA: Diagnosis not present

## 2021-12-28 DIAGNOSIS — E119 Type 2 diabetes mellitus without complications: Secondary | ICD-10-CM

## 2021-12-28 DIAGNOSIS — M109 Gout, unspecified: Secondary | ICD-10-CM | POA: Diagnosis not present

## 2021-12-28 MED ORDER — POTASSIUM CITRATE-CITRIC ACID 1100-334 MG/5ML PO SOLN: 10.0000 meq | Freq: Three times a day (TID) | ORAL | 1 refills | Status: DC

## 2021-12-28 MED ORDER — POLYETHYLENE GLYCOL 3350 17 GM/SCOOP PO POWD: ORAL | 1 refills | Status: AC

## 2021-12-28 NOTE — Progress Notes (Unsigned)
Bariatric surgery pending.  Recheck BP reasonable today at 130/90.  D/w pt about changing to liquid potassium after surgery, but no change in dose.  I printed a prescription for the patient to have in the meantime.  He still dealing with chronic back pain at baseline.  Not sedated.  Recent gout flare resolved in the meantime. Colchicine helped.  L foot.  Discussed using colchicine as needed, if he has any return of symptoms.  He qualified for medicaid in the meantime.    Discussed DM2 f/u after surgery.  He is set up for postop visits here in clinic.  I greatly appreciate the help from surgery clinic.  Meds, vitals, and allergies reviewed.   ROS: Per HPI unless specifically indicated in ROS section   GEN: nad, alert and oriented HEENT: ncat NECK: supple w/o LA CV: rrr. PULM: ctab, no inc wob ABD: soft, +bs EXT: no edema SKIN: Well-perfused

## 2021-12-28 NOTE — Patient Instructions (Addendum)
You could take colchicine for another day or two to try to get the gout flare to fully resolve.   Change to liquid potassium after surgery.   Plan on recheck here as scheduled.   Thanks for your effort.

## 2021-12-29 NOTE — Assessment & Plan Note (Signed)
See above.  Can use colchicine as needed.

## 2021-12-29 NOTE — Assessment & Plan Note (Signed)
Blood pressure reasonable today here in clinic.  The goal is to taper his medication as allowed after bariatric surgery.  I printed a prescription for potassium for him to have in the meantime, for liquid potassium citrate.  He can use that postop.

## 2021-12-29 NOTE — Assessment & Plan Note (Signed)
Discussed follow-up here after bariatric surgery.  He is starting his bariatric surgery preop diet.

## 2021-12-31 ENCOUNTER — Ambulatory Visit: Payer: Self-pay | Admitting: General Surgery

## 2021-12-31 DIAGNOSIS — E119 Type 2 diabetes mellitus without complications: Secondary | ICD-10-CM

## 2021-12-31 DIAGNOSIS — I10 Essential (primary) hypertension: Secondary | ICD-10-CM

## 2021-12-31 NOTE — Progress Notes (Signed)
Sent message, via epic in basket, requesting orders in epic from surgeon.  

## 2022-01-03 ENCOUNTER — Telehealth: Payer: Self-pay | Admitting: Dietician

## 2022-01-03 NOTE — Telephone Encounter (Signed)
Pt called with concern of diarrhea shortly after starting the pre-op diet on Wednesday, 6 December. Pt states he is drinking two protein shakes a day and eating chicken, beans and non starchy vegetables. Dietitian advised to hold off on the beans for now. Pt encouraged to get plenty of fluids, greater than 64 ounces a day.  Pt advised to call surgeons office if it continues after 2 days.

## 2022-01-06 NOTE — Patient Instructions (Addendum)
SURGICAL WAITING ROOM VISITATION Patients having surgery or a procedure may have no more than 2 support people in the waiting area - these visitors may rotate.   Children under the age of 62 must have an adult with them who is not the patient. If the patient needs to stay at the hospital during part of their recovery, the visitor guidelines for inpatient rooms apply. Pre-op nurse will coordinate an appropriate time for 1 support person to accompany patient in pre-op.  This support person may not rotate.    Please refer to the [facility] website for the visitor guidelines for Inpatients (after your surgery is over and you are in a regular room).      Your procedure is scheduled on: 01-11-22   Report to [facility] Main Entrance    Report to admitting at 5:15 AM   Call this number if you have problems the morning of surgery [phone]   Do not eat food :After 6:00 PM the night before surgery .   After 6:00 PM you may have the following liquids until 4:30 AM DAY OF SURGERY  Water Non-Citrus Juices (without pulp, NO RED) Carbonated Beverages Black Coffee (NO MILK/CREAM OR CREAMERS, sugar ok)  Clear Tea (NO MILK/CREAM OR CREAMERS, sugar ok) regular and decaf                             Plain Jell-O (NO RED)                                           Fruit ices (not with fruit pulp, NO RED)                                     Popsicles (NO RED)                                                               Sports drinks like Gatorade (NO RED)                   The day of surgery:  Drink ONE (1) Pre-Surgery  G2 at 4:30 AM the morning of surgery. Drink in one sitting. Do not sip.  This drink was given to you during your hospital  pre-op appointment visit. Nothing else to drink after completing the Pre-Surgery G2.          If you have questions, please contact your surgeon's office.   FOLLOW BOWEL PREP AND ANY ADDITIONAL PRE OP INSTRUCTIONS YOU RECEIVED FROM YOUR SURGEON'S  OFFICE!!!     Oral Hygiene is also important to reduce your risk of infection.                                    Remember - BRUSH YOUR TEETH THE MORNING OF SURGERY WITH YOUR REGULAR TOOTHPASTE   Do NOT smoke after Midnight   Take these medicines the morning of surgery with A SIP OF WATER:   Allopurinol  Abilify  Buspirone  Colchicine  Escitalopram  Claritin  Pantoprazole  Tamsulosin if needed                              You may not have any metal on your body including  jewelry, and body piercing       Do not shave  48 hours prior to surgery.               Men may shave face and neck.   Do not bring valuables to the hospital. [place].   Contacts, dentures or bridgework may not be worn into surgery.   Bring small overnight bag day of surgery.   DO NOT [place]. PHARMACY WILL DISPENSE MEDICATIONS LISTED ON YOUR MEDICATION LIST TO YOU DURING YOUR ADMISSION [place]!                Please read over the following fact sheets you were given: IF [place] Gwen  If you received a COVID test during your pre-op visit  it is requested that you wear a mask when out in public, stay away from anyone that may not be feeling well and notify your surgeon if you develop symptoms. If you test positive for Covid or have been in contact with anyone that has tested positive in the last 10 days please notify you surgeon.  [place] - Preparing for Surgery Before surgery, you can play an important role.  Because skin is not sterile, your skin needs to be as free of germs as possible.  You can reduce the number of germs on your skin by washing with CHG (chlorahexidine gluconate) soap before surgery.  CHG is an antiseptic cleaner which kills germs and bonds with the skin to continue killing germs even after washing. Please DO NOT use if you have an allergy  to CHG or antibacterial soaps.  If your skin becomes reddened/irritated stop using the CHG and inform your nurse when you arrive at Short Stay. Do not shave (including legs and underarms) for at least 48 hours prior to the first CHG shower.  You may shave your face/neck.  Please follow these instructions carefully:  1.  Shower with CHG Soap the night before surgery and the  morning of surgery.  2.  If you choose to wash your hair, wash your hair first as usual with your normal  shampoo.  3.  After you shampoo, rinse your hair and body thoroughly to remove the shampoo.                             4.  Use CHG as you would any other liquid soap.  You can apply chg directly to the skin and wash.  Gently with a scrungie or clean washcloth.  5.  Apply the CHG Soap to your body ONLY FROM THE NECK DOWN.   Do   not use on face/ open                           Wound or open sores. Avoid contact with eyes, ears mouth and   genitals (private parts).  Wash face,  Genitals (private parts) with your normal soap.             6.  Wash thoroughly, paying special attention to the area where your    surgery  will be performed.  7.  Thoroughly rinse your body with warm water from the neck down.  8.  DO NOT shower/wash with your normal soap after using and rinsing off the CHG Soap.                9.  Pat yourself dry with a clean towel.            10.  Wear clean pajamas.            11.  Place clean sheets on your bed the night of your first shower and do not  sleep with pets. Day of Surgery : Do not apply any lotions/deodorants the morning of surgery.  Please wear clean clothes to the hospital/surgery center.  FAILURE TO FOLLOW THESE INSTRUCTIONS MAY RESULT IN THE CANCELLATION OF YOUR SURGERY  PATIENT SIGNATURE_________________________________  NURSE SIGNATURE__________________________________  ________________________________________________________________________    Adam Phenix  An incentive spirometer is a tool that can help keep your lungs clear and active. This tool measures how well you are filling your lungs with each breath. Taking long deep breaths may help reverse or decrease the chance of developing breathing (pulmonary) problems (especially infection) following: A long period of time when you are unable to move or be active. BEFORE THE PROCEDURE  If the spirometer includes an indicator to show your best effort, your nurse or respiratory therapist will set it to a desired goal. If possible, sit up straight or lean slightly forward. Try not to slouch. Hold the incentive spirometer in an upright position. INSTRUCTIONS FOR USE  Sit on the edge of your bed if possible, or sit up as far as you can in bed or on a chair. Hold the incentive spirometer in an upright position. Breathe out normally. Place the mouthpiece in your mouth and seal your lips tightly around it. Breathe in slowly and as deeply as possible, raising the piston or the ball toward the top of the column. Hold your breath for 3-5 seconds or for as long as possible. Allow the piston or ball to fall to the bottom of the column. Remove the mouthpiece from your mouth and breathe out normally. Rest for a few seconds and repeat Steps 1 through 7 at least 10 times every 1-2 hours when you are awake. Take your time and take a few normal breaths between deep breaths. The spirometer may include an indicator to show your best effort. Use the indicator as a goal to work toward during each repetition. After each set of 10 deep breaths, practice coughing to be sure your lungs are clear. If you have an incision (the cut made at the time of surgery), support your incision when coughing by placing a pillow or rolled up towels firmly against it. Once you are able to get out of bed, walk around indoors and cough well. You may stop using the incentive spirometer when instructed by your caregiver.  RISKS AND  COMPLICATIONS Take your time so you do not get dizzy or light-headed. If you are in pain, you may need to take or ask for pain medication before doing incentive spirometry. It is harder to take a deep breath if you are having pain. AFTER USE Rest and breathe slowly and easily. It can be  helpful to keep track of a log of your progress. Your caregiver can provide you with a simple table to help with this. If you are using the spirometer at home, follow these instructions: [place] IF:  You are having difficultly using the spirometer. You have trouble using the spirometer as often as instructed. Your pain medication is not giving enough relief while using the spirometer. You develop fever of 100.5 F (38.1 C) or higher. SEEK IMMEDIATE MEDICAL CARE IF:  You cough up bloody sputum that had not been present before. You develop fever of 102 F (38.9 C) or greater. You develop worsening pain at or near the incision site. MAKE SURE YOU:  Understand these instructions. Will watch your condition. Will get help right away if you are not doing well or get worse. Document Released: 05/23/2006 Document Revised: 04/04/2011 Document Reviewed: 07/24/2006 ExitCare Patient Information 2014 ExitCare, [place].   ________________________________________________________________________ WHAT IS A BLOOD TRANSFUSION? Blood Transfusion Information  A transfusion is the replacement of blood or some of its parts. Blood is made up of multiple cells which provide different functions. Red blood cells carry oxygen and are used for blood loss replacement. White blood cells fight against infection. Platelets control bleeding. Plasma helps clot blood. Other blood products are available for specialized needs, such as hemophilia or other clotting disorders. BEFORE THE TRANSFUSION  Who gives blood for transfusions?  Healthy volunteers who are fully evaluated to make sure their blood is safe. This is blood bank  blood. Transfusion therapy is the safest it has ever been in the practice of medicine. Before blood is taken from a donor, a complete history is taken to make sure that person has no history of diseases nor engages in risky social behavior (examples are intravenous drug use or sexual activity with multiple partners). The donor's travel history is screened to minimize risk of transmitting infections, such as malaria. The donated blood is tested for signs of infectious diseases, such as HIV and hepatitis. The blood is then tested to be sure it is compatible with you in order to minimize the chance of a transfusion reaction. If you or a relative donates blood, this is often done in anticipation of surgery and is not appropriate for emergency situations. It takes many days to process the donated blood. RISKS AND COMPLICATIONS Although transfusion therapy is very safe and saves many lives, the main dangers of transfusion include:  Getting an infectious disease. Developing a transfusion reaction. This is an allergic reaction to something in the blood you were given. Every precaution is taken to prevent this. The decision to have a blood transfusion has been considered carefully by your caregiver before blood is given. Blood is not given unless the benefits outweigh the risks. AFTER THE TRANSFUSION Right after receiving a blood transfusion, you will usually feel much better and more energetic. This is especially true if your red blood cells have gotten low (anemic). The transfusion raises the level of the red blood cells which carry oxygen, and this usually causes an energy increase. The nurse administering the transfusion will monitor you carefully for complications. HOME CARE INSTRUCTIONS  No special instructions are needed after a transfusion. You may find your energy is better. Speak with your caregiver about any limitations on activity for underlying diseases you may have. SEEK MEDICAL CARE IF:  Your  condition is not improving after your transfusion. You develop redness or irritation at the intravenous (IV) site. SEEK IMMEDIATE MEDICAL CARE IF:  Any of  the following symptoms occur over the next 12 hours: Shaking chills. You have a temperature by mouth above 102 F (38.9 C), not controlled by medicine. Chest, back, or muscle pain. People around you feel you are not acting correctly or are confused. Shortness of breath or difficulty breathing. Dizziness and fainting. You get a rash or develop hives. You have a decrease in urine output. Your urine turns a dark color or changes to pink, red, or brown. Any of the following symptoms occur over the next 10 days: You have a temperature by mouth above 102 F (38.9 C), not controlled by medicine. Shortness of breath. Weakness after normal activity. The white part of the eye turns yellow (jaundice). You have a decrease in the amount of urine or are urinating less often. Your urine turns a dark color or changes to pink, red, or brown. Document Released: 01/08/2000 Document Revised: 04/04/2011 Document Reviewed: 08/27/2007 [facility] Patient Information 2014 [place], [place].  _______________________________________________________________________

## 2022-01-06 NOTE — Progress Notes (Addendum)
COVID Vaccine Completed:  Yes  Date of COVID positive in last 90 days:  No  PCP - Elsie Stain, MD Cardiologist - N/A  Chest x-ray - 11-12-21 Epic EKG - 08-03-21 Epic Stress Test - N/A ECHO - N/A Cardiac Cath - N/A Pacemaker/ICD device last checked:N/A Spinal Cord Stimulator:  Yes.  Patient to bring remote  Bowel Prep - N/A  Sleep Study - Yes, neg sleep apnea CPAP - No  Fasting Blood Sugar -  Checks Blood Sugar - does not check   Last dose of GLP1 agonist-  N/A GLP1 instructions:  N/A   Last dose of SGLT-2 inhibitors-  N/A SGLT-2 instructions: N/A  Blood Thinner Instructions: N/A Aspirin Instructions:   Last Dose:  Activity level:  Can go up a flight of stairs and perform activities of daily living without stopping and without symptoms of chest pain.  Patient states that he does have shortness of breat with exertion he thinks due to his size.  Anesthesia review:  Patient states that he does have shortness of breath with exertion,  He attributes his SOB to his size.  BMI 51.54,   Hx of seizures, last seziure 10 years ago  AST 96 and ALT 129 on PAT labs  Patient denies shortness of breath, fever, cough and chest pain at PAT appointment  Patient verbalized understanding of instructions that were given to them at the PAT appointment. Patient was also instructed that they will need to review over the PAT instructions again at home before surgery.

## 2022-01-07 ENCOUNTER — Encounter (HOSPITAL_COMMUNITY)
Admission: RE | Admit: 2022-01-07 | Discharge: 2022-01-07 | Disposition: A | Discharge status: Home / Self Care | Payer: Commercial Managed Care - HMO | Source: Ambulatory Visit | Attending: General Surgery | Admitting: General Surgery

## 2022-01-07 ENCOUNTER — Other Ambulatory Visit: Payer: Self-pay

## 2022-01-07 ENCOUNTER — Encounter (HOSPITAL_COMMUNITY): Payer: Self-pay

## 2022-01-07 VITALS — BP 141/87 | HR 83 | Temp 98.2°F | Resp 12 | Ht 72.0 in | Wt 380.0 lb

## 2022-01-07 DIAGNOSIS — Z01812 Encounter for preprocedural laboratory examination: Secondary | ICD-10-CM | POA: Insufficient documentation

## 2022-01-07 DIAGNOSIS — E119 Type 2 diabetes mellitus without complications: Secondary | ICD-10-CM | POA: Diagnosis not present

## 2022-01-07 DIAGNOSIS — I10 Essential (primary) hypertension: Secondary | ICD-10-CM

## 2022-01-07 HISTORY — DX: Anxiety disorder, unspecified: F41.9

## 2022-01-07 HISTORY — DX: Essential (primary) hypertension: I10

## 2022-01-07 HISTORY — DX: Dyspnea, unspecified: R06.00

## 2022-01-07 HISTORY — DX: Type 2 diabetes mellitus without complications: E11.9

## 2022-01-07 LAB — CBC WITH DIFFERENTIAL/PLATELET
Abs Immature Granulocytes: 0.05 10*3/uL (ref 0.00–0.07)
Basophils Absolute: 0.1 10*3/uL (ref 0.0–0.1)
Basophils Relative: 1 %
Eosinophils Absolute: 0.3 10*3/uL (ref 0.0–0.5)
Eosinophils Relative: 3 %
HCT: 52.4 % — ABNORMAL HIGH (ref 39.0–52.0)
Hemoglobin: 17.8 g/dL — ABNORMAL HIGH (ref 13.0–17.0)
Immature Granulocytes: 1 %
Lymphocytes Relative: 24 %
Lymphs Abs: 2.4 10*3/uL (ref 0.7–4.0)
MCH: 30.4 pg (ref 26.0–34.0)
MCHC: 34 g/dL (ref 30.0–36.0)
MCV: 89.4 fL (ref 80.0–100.0)
Monocytes Absolute: 1.1 10*3/uL — ABNORMAL HIGH (ref 0.1–1.0)
Monocytes Relative: 10 %
Neutro Abs: 6.2 10*3/uL (ref 1.7–7.7)
Neutrophils Relative %: 61 %
Platelets: 346 10*3/uL (ref 150–400)
RBC: 5.86 MIL/uL — ABNORMAL HIGH (ref 4.22–5.81)
RDW: 14.2 % (ref 11.5–15.5)
WBC: 10.1 10*3/uL (ref 4.0–10.5)
nRBC: 0 % (ref 0.0–0.2)

## 2022-01-07 LAB — COMPREHENSIVE METABOLIC PANEL
ALT: 129 U/L — ABNORMAL HIGH (ref 0–44)
AST: 96 U/L — ABNORMAL HIGH (ref 15–41)
Albumin: 4.6 g/dL (ref 3.5–5.0)
Alkaline Phosphatase: 65 U/L (ref 38–126)
Anion gap: 9 (ref 5–15)
BUN: 15 mg/dL (ref 6–20)
CO2: 28 mmol/L (ref 22–32)
Calcium: 9.6 mg/dL (ref 8.9–10.3)
Chloride: 103 mmol/L (ref 98–111)
Creatinine, Ser: 0.65 mg/dL (ref 0.61–1.24)
GFR, Estimated: >60 mL/min (ref >60)
Glucose, Bld: 90 mg/dL (ref 70–99)
Potassium: 4.4 mmol/L (ref 3.5–5.1)
Sodium: 140 mmol/L (ref 135–145)
Total Bilirubin: 0.8 mg/dL (ref 0.3–1.2)
Total Protein: 7.7 g/dL (ref 6.5–8.1)

## 2022-01-07 LAB — GLUCOSE, CAPILLARY: Glucose-Capillary: 99 mg/dL (ref 70–99)

## 2022-01-07 NOTE — Progress Notes (Signed)
Bari bed requested with portable equipment for surgery on 01-11-22.

## 2022-01-08 LAB — HEMOGLOBIN A1C
Hgb A1c MFr Bld: 6.2 % — ABNORMAL HIGH (ref 4.8–5.6)
Mean Plasma Glucose: 131 mg/dL

## 2022-01-10 ENCOUNTER — Telehealth: Payer: Self-pay

## 2022-01-10 MED ORDER — POTASSIUM CITRATE-CITRIC ACID 1100-334 MG/5ML PO SOLN: 10.0000 meq | Freq: Three times a day (TID) | ORAL | 1 refills | Status: AC

## 2022-01-10 NOTE — Telephone Encounter (Signed)
Sending note to Dr Damita Dunnings and Damita Dunnings pool.   [place] Night - Client TELEPHONE ADVICE RECORD Access[facility]urse Patient Name: Casey Caldwell [place] Gender: Male DOB: 01/12/1981 Age: 41 Y 90 M 7 D Return Phone Number: [phone] (Primary), [phone] (Secondary) Address: City/ State/ Zip: [place]  [address] Client [place] Night - Client Client Site [place] Provider Renford Dills - MD Contact Type Call Who Is Calling Pharmacy Call Type Pharmacy Send to RN Chief Complaint Paging or Request for Consult Reason for Call Request to change medication order Initial Comment Santiago Glad from [facility] calling about changing of an order. The medication is polycitra and she needs to change the quantity. Pharmacy Name [facility] Pharmacist Name [place] Number [phone] Translation No Nurse Assessment Nurse: Marcello Moores, RN, Cheri Date/Time Eilene Ghazi Time): 01/08/2022 1:31:39 PM Confirm and document reason for call. If symptomatic, describe symptoms. 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 No new or worsening symptoms at this time are known to this pharmacist. Does the patient have any new or worsening symptoms? ---No Please document clinical information provided and list any resource used. ---This is a pharmacy call. Nurse: TMarcello Moores RN, Cheri Date/Time (Eilene GhaziTime): 01/08/2022 1:34:06 PM Please select the assessment type ---Pharmacy clarification Additional Documentation ---Polycitra 51mTID, dispense 450 ml with 1 refill, comes in a bottle of 473 ml Authorized increase for total amount dispensed from 450 ml to 473 ml. Is there an on-call physician for the client? ---Yes Do the client directives allow paging the on call for medication concerns?  ---Yes Document information that requires clarification. ---Polycitra 53m64mID, dispense 450 ml with 1 refill, comes in a bottle of 473 ml. Additional Documentation ---Authorized increase from 450 ml to 473 ml. PLEASE NOTE: All timestamps contained within this report are represented as [place] Time. CONFIDENTIALTY NOTICE: This fax transmission is intended only for the addressee. It contains information that is legally privileged, confidential or otherwise protected from use or disclosure. If you are not the intended recipient, you are strictly prohibited from reviewing, disclosing, copying using or disseminating any of this information or taking any action in reliance on or regarding this information. If you have received this fax in error, please notify us [place] by telephone so that we can arrange for its return to us.[place]: [phone]-Free: [phone]: [phone]: 2 of 2 Call Id: [id number] (EaEilene Ghazime) Disposition Final User 01/08/2022 1:35:53 PM Clinical Call Yes ThoMarcello Moores, Cheri Final Disposition 01/08/2022 1:35:53 PM Clinical Call Yes ThoMarcello MooresN, ChePaulino Rily

## 2022-01-10 NOTE — Telephone Encounter (Signed)
Resent for new quant.  Thanks.

## 2022-01-10 NOTE — Anesthesia Preprocedure Evaluation (Signed)
Anesthesia Evaluation  Patient identified by MRN, date of birth, ID band Patient awake    Reviewed: Allergy & Precautions, NPO status , Patient's Chart, lab work & pertinent test results  History of Anesthesia Complications Negative for: history of anesthetic complications  Airway Mallampati: III  TM Distance: >3 FB Neck ROM: Full    Dental no notable dental hx. (+) Dental Advisory Given   Pulmonary neg pulmonary ROS   Pulmonary exam normal        Cardiovascular hypertension, Pt. on medications Normal cardiovascular exam     Neuro/Psych  PSYCHIATRIC DISORDERS Anxiety Depression    Spinal cord stimulator negative neurological ROS     GI/Hepatic Neg liver ROS,GERD  Medicated and Controlled,,  Endo/Other  diabetes  Morbid obesity  Renal/GU negative Renal ROS     Musculoskeletal negative musculoskeletal ROS (+)    Abdominal   Peds  Hematology negative hematology ROS (+)   Anesthesia Other Findings   Reproductive/Obstetrics                             Anesthesia Physical Anesthesia Plan  ASA: 3  Anesthesia Plan: General   Post-op Pain Management: Tylenol PO (pre-op)*, Ketamine IV* and Lidocaine infusion*   Induction: Intravenous  PONV Risk Score and Plan: 4 or greater and Ondansetron, Dexamethasone, Midazolam and Scopolamine patch - Pre-op  Airway Management Planned: Oral ETT  Additional Equipment:   Intra-op Plan:   Post-operative Plan: Extubation in OR  Informed Consent: I have reviewed the patients History and Physical, chart, labs and discussed the procedure including the risks, benefits and alternatives for the proposed anesthesia with the patient or authorized representative who has indicated his/her understanding and acceptance.     Dental advisory given  Plan Discussed with: Anesthesiologist and CRNA  Anesthesia Plan Comments:        Anesthesia Quick  Evaluation

## 2022-01-11 ENCOUNTER — Encounter (HOSPITAL_COMMUNITY)
Admission: RE | Disposition: A | Discharge status: Home / Self Care | Payer: Self-pay | Source: Ambulatory Visit | Attending: General Surgery

## 2022-01-11 ENCOUNTER — Inpatient Hospital Stay (HOSPITAL_COMMUNITY)
Admission: RE | Admit: 2022-01-11 | Discharge: 2022-01-12 | DRG: 621 | Disposition: A | Discharge status: Home / Self Care | Payer: Commercial Managed Care - HMO | Source: Ambulatory Visit | Attending: General Surgery | Admitting: General Surgery

## 2022-01-11 ENCOUNTER — Other Ambulatory Visit (HOSPITAL_COMMUNITY): Payer: Self-pay

## 2022-01-11 ENCOUNTER — Inpatient Hospital Stay (HOSPITAL_COMMUNITY): Payer: Commercial Managed Care - HMO | Admitting: Anesthesiology

## 2022-01-11 ENCOUNTER — Other Ambulatory Visit: Payer: Self-pay

## 2022-01-11 ENCOUNTER — Inpatient Hospital Stay (HOSPITAL_COMMUNITY): Payer: Commercial Managed Care - HMO | Admitting: Physician Assistant

## 2022-01-11 ENCOUNTER — Encounter (HOSPITAL_COMMUNITY): Payer: Self-pay | Admitting: General Surgery

## 2022-01-11 DIAGNOSIS — G894 Chronic pain syndrome: Secondary | ICD-10-CM | POA: Diagnosis present

## 2022-01-11 DIAGNOSIS — I10 Essential (primary) hypertension: Secondary | ICD-10-CM | POA: Diagnosis present

## 2022-01-11 DIAGNOSIS — Z79891 Long term (current) use of opiate analgesic: Secondary | ICD-10-CM | POA: Diagnosis not present

## 2022-01-11 DIAGNOSIS — Z87442 Personal history of urinary calculi: Secondary | ICD-10-CM

## 2022-01-11 DIAGNOSIS — Z9682 Presence of neurostimulator: Secondary | ICD-10-CM | POA: Diagnosis not present

## 2022-01-11 DIAGNOSIS — M109 Gout, unspecified: Secondary | ICD-10-CM | POA: Diagnosis present

## 2022-01-11 DIAGNOSIS — K219 Gastro-esophageal reflux disease without esophagitis: Secondary | ICD-10-CM | POA: Diagnosis present

## 2022-01-11 DIAGNOSIS — Z7985 Long-term (current) use of injectable non-insulin antidiabetic drugs: Secondary | ICD-10-CM | POA: Diagnosis not present

## 2022-01-11 DIAGNOSIS — Z79899 Other long term (current) drug therapy: Secondary | ICD-10-CM

## 2022-01-11 DIAGNOSIS — Z888 Allergy status to other drugs, medicaments and biological substances status: Secondary | ICD-10-CM

## 2022-01-11 DIAGNOSIS — Z9049 Acquired absence of other specified parts of digestive tract: Secondary | ICD-10-CM | POA: Diagnosis not present

## 2022-01-11 DIAGNOSIS — Z885 Allergy status to narcotic agent status: Secondary | ICD-10-CM

## 2022-01-11 DIAGNOSIS — M1990 Unspecified osteoarthritis, unspecified site: Secondary | ICD-10-CM | POA: Diagnosis present

## 2022-01-11 DIAGNOSIS — E119 Type 2 diabetes mellitus without complications: Secondary | ICD-10-CM

## 2022-01-11 DIAGNOSIS — K760 Fatty (change of) liver, not elsewhere classified: Secondary | ICD-10-CM

## 2022-01-11 DIAGNOSIS — Z9079 Acquired absence of other genital organ(s): Secondary | ICD-10-CM | POA: Diagnosis not present

## 2022-01-11 DIAGNOSIS — Z6841 Body Mass Index (BMI) 40.0 and over, adult: Secondary | ICD-10-CM

## 2022-01-11 DIAGNOSIS — Z9884 Bariatric surgery status: Principal | ICD-10-CM

## 2022-01-11 DIAGNOSIS — M545 Low back pain, unspecified: Secondary | ICD-10-CM | POA: Diagnosis present

## 2022-01-11 DIAGNOSIS — F419 Anxiety disorder, unspecified: Secondary | ICD-10-CM | POA: Diagnosis present

## 2022-01-11 DIAGNOSIS — E559 Vitamin D deficiency, unspecified: Secondary | ICD-10-CM | POA: Diagnosis present

## 2022-01-11 HISTORY — PX: GASTRIC ROUX-EN-Y: SHX5262

## 2022-01-11 LAB — GLUCOSE, CAPILLARY
Glucose-Capillary: 115 mg/dL — ABNORMAL HIGH (ref 70–99)
Glucose-Capillary: 155 mg/dL — ABNORMAL HIGH (ref 70–99)
Glucose-Capillary: 155 mg/dL — ABNORMAL HIGH (ref 70–99)

## 2022-01-11 LAB — ABO/RH: ABO/RH(D): A POS

## 2022-01-11 LAB — TYPE AND SCREEN
ABO/RH(D): A POS
Antibody Screen: NEGATIVE

## 2022-01-11 LAB — CREATININE, SERUM
Creatinine, Ser: 1.16 mg/dL (ref 0.61–1.24)
GFR, Estimated: >60 mL/min (ref >60)

## 2022-01-11 LAB — CBC
HCT: 53.6 % — ABNORMAL HIGH (ref 39.0–52.0)
Hemoglobin: 17.8 g/dL — ABNORMAL HIGH (ref 13.0–17.0)
MCH: 30.1 pg (ref 26.0–34.0)
MCHC: 33.2 g/dL (ref 30.0–36.0)
MCV: 90.7 fL (ref 80.0–100.0)
Platelets: 339 10*3/uL (ref 150–400)
RBC: 5.91 MIL/uL — ABNORMAL HIGH (ref 4.22–5.81)
RDW: 14.4 % (ref 11.5–15.5)
WBC: 19.2 10*3/uL — ABNORMAL HIGH (ref 4.0–10.5)
nRBC: 0 % (ref 0.0–0.2)

## 2022-01-11 SURGERY — LAPAROSCOPIC ROUX-EN-Y GASTRIC BYPASS WITH UPPER ENDOSCOPY
Anesthesia: General

## 2022-01-11 MED ORDER — ACETAMINOPHEN 160 MG/5ML PO SOLN
1000.0000 mg | Freq: Three times a day (TID) | ORAL | Status: DC
  Administered 2022-01-11: 1000 mg via ORAL
  Filled 2022-01-11: qty 40.6

## 2022-01-11 MED ORDER — POTASSIUM CHLORIDE IN NACL 20-0.45 MEQ/L-% IV SOLN
INTRAVENOUS | Status: DC
  Filled 2022-01-11 (×3): qty 1000

## 2022-01-11 MED ORDER — EPHEDRINE 5 MG/ML INJ
INTRAVENOUS | Status: AC
  Filled 2022-01-11: qty 5

## 2022-01-11 MED ORDER — ONDANSETRON HCL 4 MG/2ML IJ SOLN
4.0000 mg | Freq: Four times a day (QID) | INTRAMUSCULAR | Status: DC | PRN
  Administered 2022-01-11 (×2): 4 mg via INTRAVENOUS
  Filled 2022-01-11 (×2): qty 2

## 2022-01-11 MED ORDER — BUPIVACAINE LIPOSOME 1.3 % IJ SUSP
INTRAMUSCULAR | Status: DC | PRN
  Administered 2022-01-11: 20 mL

## 2022-01-11 MED ORDER — ALBUMIN HUMAN 5 % IV SOLN: INTRAVENOUS | Status: DC | PRN

## 2022-01-11 MED ORDER — PANTOPRAZOLE SODIUM 40 MG IV SOLR
40.0000 mg | Freq: Every day | INTRAVENOUS | Status: DC
  Administered 2022-01-11: 40 mg via INTRAVENOUS
  Filled 2022-01-11: qty 10

## 2022-01-11 MED ORDER — MIDAZOLAM HCL 2 MG/2ML IJ SOLN
INTRAMUSCULAR | Status: DC | PRN
  Administered 2022-01-11: 2 mg via INTRAVENOUS

## 2022-01-11 MED ORDER — DEXAMETHASONE SODIUM PHOSPHATE 10 MG/ML IJ SOLN
INTRAMUSCULAR | Status: AC
  Filled 2022-01-11: qty 1

## 2022-01-11 MED ORDER — ENOXAPARIN (LOVENOX) PATIENT EDUCATION KIT
PACK | Freq: Once | Status: AC
  Filled 2022-01-11: qty 1

## 2022-01-11 MED ORDER — ROCURONIUM BROMIDE 10 MG/ML (PF) SYRINGE
PREFILLED_SYRINGE | INTRAVENOUS | Status: AC
  Filled 2022-01-11: qty 10

## 2022-01-11 MED ORDER — LABETALOL HCL 5 MG/ML IV SOLN
INTRAVENOUS | Status: AC
  Administered 2022-01-11: 10 mg via INTRAVENOUS
  Filled 2022-01-11: qty 4

## 2022-01-11 MED ORDER — LISINOPRIL 10 MG PO TABS
10.0000 mg | ORAL_TABLET | Freq: Every day | ORAL | Status: DC
  Administered 2022-01-11 – 2022-01-12 (×2): 10 mg via ORAL
  Filled 2022-01-11 (×2): qty 1

## 2022-01-11 MED ORDER — MORPHINE SULFATE (PF) 2 MG/ML IV SOLN
1.0000 mg | INTRAVENOUS | Status: DC | PRN
  Administered 2022-01-11 – 2022-01-12 (×6): 2 mg via INTRAVENOUS
  Filled 2022-01-11 (×7): qty 1

## 2022-01-11 MED ORDER — MIDAZOLAM HCL 2 MG/2ML IJ SOLN
INTRAMUSCULAR | Status: AC
  Filled 2022-01-11: qty 2

## 2022-01-11 MED ORDER — ACETAMINOPHEN 500 MG PO TABS
1000.0000 mg | ORAL_TABLET | Freq: Three times a day (TID) | ORAL | Status: DC
  Administered 2022-01-11 – 2022-01-12 (×3): 1000 mg via ORAL
  Filled 2022-01-11 (×3): qty 2

## 2022-01-11 MED ORDER — FENTANYL CITRATE PF 50 MCG/ML IJ SOSY
25.0000 ug | PREFILLED_SYRINGE | INTRAMUSCULAR | Status: DC | PRN
  Administered 2022-01-11: 50 ug via INTRAVENOUS
  Administered 2022-01-11: 25 ug via INTRAVENOUS
  Administered 2022-01-11: 50 ug via INTRAVENOUS

## 2022-01-11 MED ORDER — ENSURE MAX PROTEIN PO LIQD
2.0000 [oz_av] | ORAL | Status: DC
  Administered 2022-01-12 (×5): 2 [oz_av] via ORAL

## 2022-01-11 MED ORDER — PHENYLEPHRINE 80 MCG/ML (10ML) SYRINGE FOR IV PUSH (FOR BLOOD PRESSURE SUPPORT)
PREFILLED_SYRINGE | INTRAVENOUS | Status: AC
  Filled 2022-01-11: qty 10

## 2022-01-11 MED ORDER — DEXAMETHASONE SODIUM PHOSPHATE 4 MG/ML IJ SOLN
4.0000 mg | INTRAMUSCULAR | Status: AC
  Administered 2022-01-11: 4 mg via INTRAVENOUS

## 2022-01-11 MED ORDER — HEPARIN SODIUM (PORCINE) 5000 UNIT/ML IJ SOLN
5000.0000 [IU] | Freq: Three times a day (TID) | INTRAMUSCULAR | Status: DC
  Administered 2022-01-11 – 2022-01-12 (×3): 5000 [IU] via SUBCUTANEOUS
  Filled 2022-01-11 (×3): qty 1

## 2022-01-11 MED ORDER — ESCITALOPRAM OXALATE 20 MG PO TABS
20.0000 mg | ORAL_TABLET | Freq: Every day | ORAL | Status: DC
  Administered 2022-01-12: 20 mg via ORAL
  Filled 2022-01-11: qty 1

## 2022-01-11 MED ORDER — ORAL CARE MOUTH RINSE: 15.0000 mL | Freq: Once | OROMUCOSAL | Status: AC

## 2022-01-11 MED ORDER — FENTANYL CITRATE (PF) 250 MCG/5ML IJ SOLN
INTRAMUSCULAR | Status: AC
  Filled 2022-01-11: qty 5

## 2022-01-11 MED ORDER — HYDRALAZINE HCL 20 MG/ML IJ SOLN: 10.0000 mg | INTRAMUSCULAR | Status: DC | PRN

## 2022-01-11 MED ORDER — KETAMINE HCL 10 MG/ML IJ SOLN
INTRAMUSCULAR | Status: AC
  Filled 2022-01-11: qty 1

## 2022-01-11 MED ORDER — BUPIVACAINE LIPOSOME 1.3 % IJ SUSP: 20.0000 mL | Freq: Once | INTRAMUSCULAR | Status: DC

## 2022-01-11 MED ORDER — LABETALOL HCL 5 MG/ML IV SOLN
10.0000 mg | INTRAVENOUS | Status: AC | PRN
  Administered 2022-01-11: 10 mg via INTRAVENOUS

## 2022-01-11 MED ORDER — BUPIVACAINE-EPINEPHRINE (PF) 0.5% -1:200000 IJ SOLN
INTRAMUSCULAR | Status: AC
  Filled 2022-01-11: qty 30

## 2022-01-11 MED ORDER — HYDROMORPHONE HCL 2 MG PO TABS
2.0000 mg | ORAL_TABLET | ORAL | Status: DC | PRN
  Administered 2022-01-11 – 2022-01-12 (×4): 2 mg via ORAL
  Filled 2022-01-11 (×4): qty 1

## 2022-01-11 MED ORDER — LIDOCAINE 2% (20 MG/ML) 5 ML SYRINGE
INTRAMUSCULAR | Status: DC | PRN
  Administered 2022-01-11: 100 mg via INTRAVENOUS

## 2022-01-11 MED ORDER — APREPITANT 40 MG PO CAPS
40.0000 mg | ORAL_CAPSULE | ORAL | Status: AC
  Administered 2022-01-11: 40 mg via ORAL
  Filled 2022-01-11: qty 1

## 2022-01-11 MED ORDER — HEPARIN SODIUM (PORCINE) 5000 UNIT/ML IJ SOLN
5000.0000 [IU] | INTRAMUSCULAR | Status: AC
  Administered 2022-01-11: 5000 [IU] via SUBCUTANEOUS
  Filled 2022-01-11: qty 1

## 2022-01-11 MED ORDER — LIDOCAINE HCL (PF) 2 % IJ SOLN
INTRAMUSCULAR | Status: AC
  Filled 2022-01-11: qty 5

## 2022-01-11 MED ORDER — EPHEDRINE SULFATE-NACL 50-0.9 MG/10ML-% IV SOSY
PREFILLED_SYRINGE | INTRAVENOUS | Status: DC | PRN
  Administered 2022-01-11 (×4): 10 mg via INTRAVENOUS

## 2022-01-11 MED ORDER — STERILE WATER FOR IRRIGATION IR SOLN
Status: DC | PRN
  Administered 2022-01-11: 500 mL
  Administered 2022-01-11: 1000 mL

## 2022-01-11 MED ORDER — AMISULPRIDE (ANTIEMETIC) 5 MG/2ML IV SOLN
10.0000 mg | Freq: Once | INTRAVENOUS | Status: AC | PRN
  Administered 2022-01-11: 10 mg via INTRAVENOUS

## 2022-01-11 MED ORDER — "VISTASEAL 4 ML SINGLE DOSE KIT "
4.0000 mL | PACK | Freq: Once | CUTANEOUS | Status: AC
  Administered 2022-01-11: 4 mL via TOPICAL
  Filled 2022-01-11: qty 4

## 2022-01-11 MED ORDER — SUGAMMADEX SODIUM 500 MG/5ML IV SOLN
INTRAVENOUS | Status: DC | PRN
  Administered 2022-01-11: 500 mg via INTRAVENOUS

## 2022-01-11 MED ORDER — PROPOFOL 10 MG/ML IV BOLUS
INTRAVENOUS | Status: DC | PRN
  Administered 2022-01-11: 200 mg via INTRAVENOUS

## 2022-01-11 MED ORDER — SUGAMMADEX SODIUM 500 MG/5ML IV SOLN
INTRAVENOUS | Status: AC
  Filled 2022-01-11: qty 5

## 2022-01-11 MED ORDER — FIBRIN SEALANT 2 ML SINGLE DOSE KIT
2.0000 mL | PACK | Freq: Once | CUTANEOUS | Status: AC
  Administered 2022-01-11: 2 mL via TOPICAL
  Filled 2022-01-11: qty 2

## 2022-01-11 MED ORDER — FENTANYL CITRATE (PF) 250 MCG/5ML IJ SOLN
INTRAMUSCULAR | Status: DC | PRN
  Administered 2022-01-11: 150 ug via INTRAVENOUS

## 2022-01-11 MED ORDER — INDAPAMIDE 1.25 MG PO TABS
2.5000 mg | ORAL_TABLET | Freq: Every day | ORAL | Status: DC
  Administered 2022-01-11 – 2022-01-12 (×2): 2.5 mg via ORAL
  Filled 2022-01-11 (×2): qty 2

## 2022-01-11 MED ORDER — ROCURONIUM BROMIDE 10 MG/ML (PF) SYRINGE
PREFILLED_SYRINGE | INTRAVENOUS | Status: DC | PRN
  Administered 2022-01-11: 100 mg via INTRAVENOUS
  Administered 2022-01-11: 30 mg via INTRAVENOUS

## 2022-01-11 MED ORDER — PHENYLEPHRINE HCL (PRESSORS) 10 MG/ML IV SOLN
INTRAVENOUS | Status: AC
  Filled 2022-01-11: qty 1

## 2022-01-11 MED ORDER — CHLORHEXIDINE GLUCONATE 4 % EX LIQD: Freq: Once | CUTANEOUS | Status: DC

## 2022-01-11 MED ORDER — PHENYLEPHRINE 80 MCG/ML (10ML) SYRINGE FOR IV PUSH (FOR BLOOD PRESSURE SUPPORT)
PREFILLED_SYRINGE | INTRAVENOUS | Status: DC | PRN
  Administered 2022-01-11: 80 ug via INTRAVENOUS
  Administered 2022-01-11 (×2): 160 ug via INTRAVENOUS

## 2022-01-11 MED ORDER — BUPIVACAINE LIPOSOME 1.3 % IJ SUSP
INTRAMUSCULAR | Status: AC
  Filled 2022-01-11: qty 20

## 2022-01-11 MED ORDER — 0.9 % SODIUM CHLORIDE (POUR BTL) OPTIME
TOPICAL | Status: DC | PRN
  Administered 2022-01-11: 1000 mL

## 2022-01-11 MED ORDER — SIMETHICONE 80 MG PO CHEW
80.0000 mg | CHEWABLE_TABLET | Freq: Four times a day (QID) | ORAL | Status: DC | PRN
  Administered 2022-01-11 – 2022-01-12 (×3): 80 mg via ORAL
  Filled 2022-01-11 (×3): qty 1

## 2022-01-11 MED ORDER — ALBUMIN HUMAN 5 % IV SOLN
INTRAVENOUS | Status: AC
  Filled 2022-01-11: qty 250

## 2022-01-11 MED ORDER — ACETAMINOPHEN 500 MG PO TABS
1000.0000 mg | ORAL_TABLET | ORAL | Status: AC
  Administered 2022-01-11: 1000 mg via ORAL
  Filled 2022-01-11: qty 2

## 2022-01-11 MED ORDER — ZOLPIDEM TARTRATE 5 MG PO TABS: 10.0000 mg | ORAL_TABLET | Freq: Every evening | ORAL | Status: DC | PRN

## 2022-01-11 MED ORDER — SODIUM CHLORIDE 0.9 % IV SOLN
2.0000 g | INTRAVENOUS | Status: AC
  Administered 2022-01-11: 2 g via INTRAVENOUS
  Filled 2022-01-11: qty 2

## 2022-01-11 MED ORDER — PROCHLORPERAZINE EDISYLATE 10 MG/2ML IJ SOLN
10.0000 mg | Freq: Four times a day (QID) | INTRAMUSCULAR | Status: DC | PRN
  Administered 2022-01-11 – 2022-01-12 (×2): 10 mg via INTRAVENOUS
  Filled 2022-01-11 (×2): qty 2

## 2022-01-11 MED ORDER — FENTANYL CITRATE PF 50 MCG/ML IJ SOSY
PREFILLED_SYRINGE | INTRAMUSCULAR | Status: AC
  Filled 2022-01-11: qty 1

## 2022-01-11 MED ORDER — LACTATED RINGERS IR SOLN
Status: DC | PRN
  Administered 2022-01-11 (×2): 1000 mL

## 2022-01-11 MED ORDER — CHLORHEXIDINE GLUCONATE 0.12 % MT SOLN
15.0000 mL | Freq: Once | OROMUCOSAL | Status: AC
  Administered 2022-01-11: 15 mL via OROMUCOSAL

## 2022-01-11 MED ORDER — LACTATED RINGERS IV SOLN: INTRAVENOUS | Status: DC

## 2022-01-11 MED ORDER — PROPOFOL 10 MG/ML IV BOLUS
INTRAVENOUS | Status: AC
  Filled 2022-01-11: qty 20

## 2022-01-11 MED ORDER — SCOPOLAMINE 1 MG/3DAYS TD PT72
1.0000 | MEDICATED_PATCH | TRANSDERMAL | Status: DC
  Administered 2022-01-11: 1.5 mg via TRANSDERMAL
  Filled 2022-01-11: qty 1

## 2022-01-11 MED ORDER — BUPIVACAINE-EPINEPHRINE 0.5% -1:200000 IJ SOLN
INTRAMUSCULAR | Status: DC | PRN
  Administered 2022-01-11: 30 mL

## 2022-01-11 MED ORDER — KETAMINE HCL 10 MG/ML IJ SOLN
INTRAMUSCULAR | Status: DC | PRN
  Administered 2022-01-11: 40 mg via INTRAVENOUS

## 2022-01-11 MED ORDER — BUSPIRONE HCL 5 MG PO TABS
5.0000 mg | ORAL_TABLET | Freq: Two times a day (BID) | ORAL | Status: DC
  Administered 2022-01-12: 5 mg via ORAL
  Filled 2022-01-11: qty 1

## 2022-01-11 MED ORDER — ARIPIPRAZOLE 5 MG PO TABS
2.5000 mg | ORAL_TABLET | Freq: Every day | ORAL | Status: DC
  Administered 2022-01-12: 2.5 mg via ORAL
  Filled 2022-01-11: qty 1

## 2022-01-11 MED ORDER — FENTANYL CITRATE PF 50 MCG/ML IJ SOSY
PREFILLED_SYRINGE | INTRAMUSCULAR | Status: AC
  Administered 2022-01-11: 25 ug via INTRAVENOUS
  Filled 2022-01-11: qty 2

## 2022-01-11 MED ORDER — INSULIN ASPART 100 UNIT/ML IJ SOLN
0.0000 [IU] | INTRAMUSCULAR | Status: DC
  Administered 2022-01-11 (×2): 4 [IU] via SUBCUTANEOUS

## 2022-01-11 MED ORDER — PHENYLEPHRINE HCL-NACL 20-0.9 MG/250ML-% IV SOLN
INTRAVENOUS | Status: DC | PRN
  Administered 2022-01-11: 50 ug/min via INTRAVENOUS

## 2022-01-11 MED ORDER — LIDOCAINE HCL 2 % IJ SOLN
INTRAMUSCULAR | Status: AC
  Filled 2022-01-11: qty 20

## 2022-01-11 MED ORDER — ONDANSETRON HCL 4 MG/2ML IJ SOLN
INTRAMUSCULAR | Status: AC
  Filled 2022-01-11: qty 2

## 2022-01-11 MED ORDER — ONDANSETRON HCL 4 MG/2ML IJ SOLN
INTRAMUSCULAR | Status: DC | PRN
  Administered 2022-01-11: 4 mg via INTRAVENOUS

## 2022-01-11 MED ORDER — ALLOPURINOL 300 MG PO TABS
300.0000 mg | ORAL_TABLET | Freq: Every day | ORAL | Status: DC
  Administered 2022-01-12: 300 mg via ORAL
  Filled 2022-01-11: qty 1

## 2022-01-11 MED ORDER — LIDOCAINE 20MG/ML (2%) 15 ML SYRINGE OPTIME
INTRAMUSCULAR | Status: DC | PRN
  Administered 2022-01-11: 1.5 mg/kg/h via INTRAVENOUS

## 2022-01-11 MED ORDER — AMISULPRIDE (ANTIEMETIC) 5 MG/2ML IV SOLN
INTRAVENOUS | Status: AC
  Filled 2022-01-11: qty 4

## 2022-01-11 SURGICAL SUPPLY — 81 items
ANTIFOG SOL W/FOAM PAD STRL (MISCELLANEOUS) ×1
APPLICATOR COTTON TIP 6 STRL (MISCELLANEOUS) IMPLANT
APPLICATOR COTTON TIP 6IN STRL (MISCELLANEOUS)
APPLICATOR VISTASEAL 35 (MISCELLANEOUS) ×2 IMPLANT
APPLIER CLIP ROT 13.4 12 LRG (CLIP)
BAG COUNTER SPONGE SURGICOUNT (BAG) IMPLANT
BLADE SURG SZ11 CARB STEEL (BLADE) ×1 IMPLANT
CABLE HIGH FREQUENCY MONO STRZ (ELECTRODE) IMPLANT
CHLORAPREP W/TINT 26 (MISCELLANEOUS) ×2 IMPLANT
CLIP APPLIE ROT 13.4 12 LRG (CLIP) IMPLANT
CLIP SUT LAPRA TY ABSORB (SUTURE) ×2 IMPLANT
CUTTER FLEX LINEAR 45M (STAPLE) IMPLANT
DERMABOND ADVANCED .7 DNX12 (GAUZE/BANDAGES/DRESSINGS) IMPLANT
DEVICE SUT QUICK LOAD TK 5 (SUTURE) IMPLANT
DEVICE SUT TI-KNOT TK 5X26 (SUTURE) IMPLANT
DEVICE SUTURE ENDOST 10MM (ENDOMECHANICALS) ×1 IMPLANT
DRAIN PENROSE 0.25X18 (DRAIN) ×1 IMPLANT
DRSG TEGADERM 2-3/8X2-3/4 SM (GAUZE/BANDAGES/DRESSINGS) ×6 IMPLANT
ELECT PENCIL ROCKER SW 15FT (MISCELLANEOUS) IMPLANT
ELECT REM PT RETURN 15FT ADLT (MISCELLANEOUS) ×1 IMPLANT
GAUZE 4X4 16PLY ~~LOC~~+RFID DBL (SPONGE) ×1 IMPLANT
GAUZE SPONGE 2X2 8PLY STRL LF (GAUZE/BANDAGES/DRESSINGS) ×1 IMPLANT
GAUZE SPONGE 4X4 12PLY STRL (GAUZE/BANDAGES/DRESSINGS) IMPLANT
GLOVE BIO SURGEON STRL SZ7.5 (GLOVE) ×1 IMPLANT
GLOVE INDICATOR 8.0 STRL GRN (GLOVE) ×1 IMPLANT
GOWN STRL REUS W/ TWL XL LVL3 (GOWN DISPOSABLE) ×4 IMPLANT
GOWN STRL REUS W/TWL XL LVL3 (GOWN DISPOSABLE) ×4
IRRIG SUCT STRYKERFLOW 2 WTIP (MISCELLANEOUS) ×1
IRRIGATION SUCT STRKRFLW 2 WTP (MISCELLANEOUS) ×1 IMPLANT
KIT BASIN OR (CUSTOM PROCEDURE TRAY) ×1 IMPLANT
KIT GASTRIC LAVAGE 34FR ADT (SET/KITS/TRAYS/PACK) ×1 IMPLANT
KIT TURNOVER KIT A (KITS) IMPLANT
MARKER SKIN DUAL TIP RULER LAB (MISCELLANEOUS) ×1 IMPLANT
MAT PREVALON FULL STRYKER (MISCELLANEOUS) ×1 IMPLANT
NDL SPNL 22GX3.5 QUINCKE BK (NEEDLE) ×1 IMPLANT
NEEDLE SPNL 22GX3.5 QUINCKE BK (NEEDLE) ×1 IMPLANT
PACK CARDIOVASCULAR III (CUSTOM PROCEDURE TRAY) ×1 IMPLANT
RELOAD 45 VASCULAR/THIN (ENDOMECHANICALS) IMPLANT
RELOAD ENDO STITCH 2.0 (ENDOMECHANICALS) ×9
RELOAD STAPLE 45 2.5 WHT GRN (ENDOMECHANICALS) IMPLANT
RELOAD STAPLE 45 3.5 BLU ETS (ENDOMECHANICALS) IMPLANT
RELOAD STAPLE 60 2.6 WHT THN (STAPLE) ×2 IMPLANT
RELOAD STAPLE 60 3.6 BLU REG (STAPLE) ×2 IMPLANT
RELOAD STAPLE 60 3.8 GOLD REG (STAPLE) ×1 IMPLANT
RELOAD STAPLE TA45 3.5 REG BLU (ENDOMECHANICALS) IMPLANT
RELOAD STAPLER BLUE 60MM (STAPLE) ×5 IMPLANT
RELOAD STAPLER GOLD 60MM (STAPLE) ×1 IMPLANT
RELOAD STAPLER WHITE 60MM (STAPLE) ×2 IMPLANT
RELOAD SUT SNGL STCH ABSRB 2-0 (ENDOMECHANICALS) ×5 IMPLANT
RELOAD SUT SNGL STCH BLK 2-0 (ENDOMECHANICALS) ×4 IMPLANT
SCISSORS LAP 5X45 EPIX DISP (ENDOMECHANICALS) ×1 IMPLANT
SET TUBE SMOKE EVAC HIGH FLOW (TUBING) ×1 IMPLANT
SHEARS HARMONIC ACE PLUS 45CM (MISCELLANEOUS) ×1 IMPLANT
SLEEVE ADV FIXATION 12X100MM (TROCAR) ×2 IMPLANT
SLEEVE ADV FIXATION 5X100MM (TROCAR) IMPLANT
SOLUTION ANTFG W/FOAM PAD STRL (MISCELLANEOUS) ×1 IMPLANT
STAPLER ECHELON BIOABSB 60 FLE (MISCELLANEOUS) IMPLANT
STAPLER ECHELON LONG 60 440 (INSTRUMENTS) ×1 IMPLANT
STAPLER RELOAD BLUE 60MM (STAPLE) ×5
STAPLER RELOAD GOLD 60MM (STAPLE) ×1
STAPLER RELOAD WHITE 60MM (STAPLE) ×2
STRIP CLOSURE SKIN 1/2X4 (GAUZE/BANDAGES/DRESSINGS) ×1 IMPLANT
SURGILUBE 2OZ TUBE FLIPTOP (MISCELLANEOUS) ×1 IMPLANT
SUT MNCRL AB 4-0 PS2 18 (SUTURE) ×1 IMPLANT
SUT RELOAD ENDO STITCH 2 48X1 (ENDOMECHANICALS) ×5
SUT RELOAD ENDO STITCH 2.0 (ENDOMECHANICALS) ×4
SUT SURGIDAC NAB ES-9 0 48 120 (SUTURE) IMPLANT
SUT VIC AB 2-0 SH 27 (SUTURE) ×1
SUT VIC AB 2-0 SH 27X BRD (SUTURE) ×1 IMPLANT
SUT VIC AB 3-0 SH 27 (SUTURE) ×1
SUT VIC AB 3-0 SH 27X BRD (SUTURE) IMPLANT
SUTURE RELOAD END STTCH 2 48X1 (ENDOMECHANICALS) ×5 IMPLANT
SUTURE RELOAD ENDO STITCH 2.0 (ENDOMECHANICALS) ×4 IMPLANT
SYR 20ML LL LF (SYRINGE) ×2 IMPLANT
TOWEL OR 17X26 10 PK STRL BLUE (TOWEL DISPOSABLE) ×1 IMPLANT
TOWEL OR NON WOVEN STRL DISP B (DISPOSABLE) ×1 IMPLANT
TRAY FOLEY MTR SLVR 16FR STAT (SET/KITS/TRAYS/PACK) IMPLANT
TROCAR ADV FIXATION 12X100MM (TROCAR) ×1 IMPLANT
TROCAR ADV FIXATION 5X100MM (TROCAR) ×1 IMPLANT
TROCAR XCEL NON-BLD 5MMX100MML (ENDOMECHANICALS) ×1 IMPLANT
TUBING CONNECTING 10 (TUBING) ×2 IMPLANT

## 2022-01-11 NOTE — H&P (Signed)
PROVIDER: Lerlene Treadwell Leanne Chang, MD  MRN: [id number] DOB: [birth year] DATE OF ENCOUNTER: 12/30/2021 Subjective  Chief Complaint: Weight Loss (Pre op Visit - Possible Hernia )   History of Present Illness: Casey Caldwell is a 41 y.o. male who is seen today for long-term follow-up regarding his severe obesity, spinal stenosis with chronic pain syndrome, hypertension, GERD, hepatic steatosis, gout & DM2. I initially met him in October to discuss bariatric surgery. He has completed the bariatric surgery evaluation process and has been seen and evaluated by nutrition and psychology.Marland Kitchen  He denies any medical changes since I initially met him other than as determining he had diabetes mellitus type 2. He denies any trips to the emergency room or hospital. He is still on chronic narcotics for his chronic back pain. His primary care doctor manages those medications. He denies any chest pain, chest pressure, chest tightness. No nausea or vomiting.  He has completed his preoperative education class. He states he has done a lot of research and has several friends who had bariatric surgery.  Review of Systems: A complete review of systems was obtained from the patient. I have reviewed this information and discussed as appropriate with the patient. See HPI as well for other ROS.  ROS  Medical History: Past Medical History: Diagnosis Date Anxiety Arthritis GERD (gastroesophageal reflux disease) Hypertension Seizures (CMS-HCC)  Patient Active Problem List Diagnosis Chronic back pain greater than 3 months duration Chronic left-sided low back pain without sciatica Chronic, continuous use of opioids Hypertension Hypercalciuria Spinal cord stimulator status  Past Surgical History: Procedure Laterality Date ORCHIECTOMY 01/25/2011 Spinal Cord Stimulator 12/25/2014 Replacement 06/24/2021 APPENDECTOMY CHOLECYSTECTOMY LITHOTRIPSY ORCHIECTOMY spinal cord stimulator spinal cord stimulator  replacement   Allergies Allergen Reactions Bupropion Other (See Comments) H/o SZ d/o.  H/o SZ d/o. Gabapentin Other (See Comments) Intolerant, sedation  Intolerant, sedation Ketorolac Tromethamine Other (See Comments) "feels like I am going to crawl out of my skin"  "feels like I am going to crawl out of my skin" "feels like I am going to crawl out of my skin" Other Other (See Comments) Oxcarbazepine Other (See Comments) Profound insomnia, worsening mood.  Profound insomnia, worsening mood. Profound insomnia, worsening mood.  Profound insomnia, worsening mood. Profound insomnia, worsening mood. Profound insomnia, worsening mood. Promethazine Other (See Comments) Adhesive Dermatitis Oxycodone Other (See Comments) Intolerant but tolerated vicodin.  Current Outpatient Medications on File Prior to Visit Medication Sig Dispense Refill allopurinoL (ZYLOPRIM) 300 MG tablet Take 1 tablet by mouth once daily ARIPiprazole (ABILIFY) 5 MG tablet Take by mouth escitalopram oxalate (LEXAPRO) 20 MG tablet Take 20 mg by mouth once daily HYDROcodone-acetaminophen (NORCO) 5-325 mg tablet Take by mouth HYDROmorphone (DILAUDID) 2 MG tablet Take 1 tablet by mouth every 4 (four) hours indapamide (LOZOL) 2.5 MG tablet Take by mouth lisinopriL (ZESTRIL) 10 MG tablet Take 1 tablet by mouth once daily pantoprazole (PROTONIX) 40 MG DR tablet Take 1 tablet by mouth 2 (two) times daily potassium citrate (UROCIT-K) 10 mEq ER tablet Take by mouth tirzepatide (MOUNJARO) 2.5 mg/0.5 mL PnIj Inject 2.5 mg subcutaneously every 7 (seven) days 2 mL 0 zolpidem (AMBIEN) 10 mg tablet Take 1 tablet by mouth at bedtime ergocalciferol, vitamin D2, 1,250 mcg (50,000 unit) capsule Take 1 capsule (50,000 Units total) by mouth once a week for 30 days 4 capsule 0  No current facility-administered medications on file prior to visit.  Family History Problem Relation Age of Onset Stroke Mother Obesity  Mother Coronary Artery Disease (Blocked arteries around heart)  Father Skin cancer Father Obesity Brother   Social History  Tobacco Use Smoking Status Never Smokeless Tobacco Never   Social History  Socioeconomic History Marital status: Divorced Tobacco Use Smoking status: Never Smokeless tobacco: Never Substance and Sexual Activity Alcohol use: Yes Drug use: Never  Objective:  Vitals: 12/30/21 1501 12/30/21 1507 BP: (!) 140/80 Pulse: 109 Temp: 36.5 C (97.7 F) SpO2: 98% Weight: (!) 179.5 kg (395 lb 12.8 oz) Height: 181.6 cm (5' 11.5") PainLoc: Abdomen  Body mass index is 54.43 kg/m.  Gen: alert, NAD, non-toxic appearing, severe obesity Pupils: equal, no scleral icterus Pulm: Lungs clear to auscultation, symmetric chest rise CV: regular rate and rhythm Abd: soft, nontender, nondistended. old trocar sites. No cellulitis. No incisional hernia, reducible small fat-containing umbilical hernia Ext: no edema, Skin: no rash, no jaundice  Labs, Imaging and Diagnostic Testing:  Upper gi 11/12/21 Scout radiograph demonstrates cholecystectomy clips in the right upper quadrant and spinal stimulator overlying the right abdomen with lead tip overlying the lower thoracic spinal canal. No dilated small bowel loops. Mild colonic stool. No evidence of pneumatosis or pneumoperitoneum. No radiopaque nephrolithiasis.  No hiatal hernia. No gastroesophageal reflux elicited despite provocative maneuvers including water siphon test and Valsalva maneuver. Normal esophageal distensibility, with no evidence of esophageal mass, ulcer or stricture. A swallowed 13 mm barium tablet traversed the esophagus into the stomach without delay. Normal esophageal motility. Normal gastric emptying. No gastric fold thickening, filling defects or ulcers. Normal duodenal bulb and C sweep. Normal proximal jejunal loops without fold thickening.  IMPRESSION: Normal upper GI. No hiatal hernia. No  gastroesophageal reflux elicited. Normal esophageal motility.  Cxr 11/12/21 negative  PCP note 12/28/21 & 12/20/21  Labs in our system Assessment and Plan: Diagnoses and all orders for this visit:  Severe obesity (CMS-HCC)  Primary hypertension  Chronic back pain greater than 3 months duration  Spinal cord stimulator status  Diabetes mellitus type 2 in obese  Vitamin D deficiency  Chronic, continuous use of opioids  Hepatic steatosis  Umbilical hernia without obstruction and without gangrene    Patient has completed the bariatric surgery evaluation process. I believe he is still a candidate for laparoscopic Roux-en-Y gastric bypass. We discussed the importance of the preoperative meal plan in order to help shrink his liver. We reviewed his labs and his workup. His upper GI was unremarkable. He does have a small umbilical hernia which is probably a technically a small incisional hernia from his gallbladder surgery. Hopefully we can leave the plug of fat incarcerated in it if not and we have to reduce that I told him we would need to put 1 or 2 stitches in the umbilicus in order to prevent an early obstruction after surgery. We discussed that even with the primary repair it would likely recur. I told him that I would not recommend mesh placement at the time of this surgery because bariatric surgery is considered a clean contaminated procedure. We reviewed the typical hospitalization. We reviewed the typical course that we see after surgery. We discussed that his pain medication would be managed by his PCP. We rediscussed the high likelihood that he will probably go out on extended chemical VTE prophylaxis because he will more than likely meet threshold criteria. Again we discussed the importance of the preoperative meal plan. He read over and signed the surgical consent form. I offered to rediscussed the steps along with risk and benefits of the procedure but he declined.  This patient  encounter took 71  minutes today to perform the following: take history, perform exam, review outside records, interpret imaging, counsel the patient on their diagnosis and document encounter, findings & plan in the EHR  No follow-ups on file.  Leighton Ruff. Redmond Pulling MD FACS General, Minimally Invasive, & Bariatric Surgery Electronically signed by Rudean Curt, MD at 12/30/2021 3:34 PM EST

## 2022-01-11 NOTE — Anesthesia Procedure Notes (Signed)
Procedure Name: Intubation Date/Time: 01/11/2022 7:39 AM  Performed by: Lollie Sails, CRNAPre-anesthesia Checklist: Patient identified, Emergency Drugs available, Suction available, Patient being monitored and Timeout performed Patient Re-evaluated:Patient Re-evaluated prior to induction Oxygen Delivery Method: Circle system utilized Preoxygenation: Pre-oxygenation with 100% oxygen Induction Type: IV induction Ventilation: Mask ventilation without difficulty and Two handed mask ventilation required Laryngoscope Size: Miller and 3 Grade View: Grade II Tube type: Oral Tube size: 8.0 mm Number of attempts: 1 Airway Equipment and Method: Stylet Placement Confirmation: ETT inserted through vocal cords under direct vision, positive ETCO2 and breath sounds checked- equal and bilateral Secured at: 25 cm Tube secured with: Tape Dental Injury: Teeth and Oropharynx as per pre-operative assessment

## 2022-01-11 NOTE — Progress Notes (Signed)
PHARMACY CONSULT FOR:  Risk Assessment for Post-Discharge VTE Following Bariatric Surgery  Post-Discharge VTE Risk Assessment: This patient's probability of 30-day post-discharge VTE is increased due to the factors marked:  Sleeve gastrectomy  x Liver disorder (transplant, cirrhosis, or nonalcoholic steatohepatitis)   Hx of VTE   Hemorrhage requiring transfusion   GI perforation, leak, or obstruction   ====================================================  x  Male    Age >/=60 years  x  BMI >/=50 kg/m2    CHF    Dyspnea at Rest    Paraplegia   x Non-gastric-band surgery    Operation Time >/=3 hr    Return to OR     Length of Stay >/= 3 d   Hypercoagulable condition   Significant venous stasis      Predicted probability of 30-day post-discharge VTE: - 0.51%  Other patient-specific factors to consider: - No noted history of PE/DVT  Recommendation for Discharge: Enoxaparin 60 mg [place] q12h x 2 weeks post-discharge        Casey Caldwell is a 41 y.o. male who underwent  Laparoscopic Roux-en-Y gastric bypass  on 01/11/22   Case start: 0802 Case end: 0955   Allergies  Allergen Reactions   Gabapentin     Intolerant, sedation    Oxycodone     "feels like I am going to crawl out of my skin"   Phenergan [Promethazine Hcl]     Unknown reaction   Tape Hives    Adhesive tape   Toradol [Ketorolac Tromethamine]     "feels like I am going to crawl out of my skin"   Trileptal [Oxcarbazepine] Other (See Comments)    Profound insomnia, worsening mood.    Wellbutrin [Bupropion]     H/o SZ d/o. Causes mood swings    Patient Measurements: Height: 6' (182.9 cm) Weight: (!) 172.4 kg (380 lb) IBW/kg (Calculated) : 77.6 Body mass index is 51.54 kg/m.  No results for input(s): "WBC", "HGB", "HCT", "PLT", "APTT", "CREATININE", "LABCREA", "CREAT24HRUR", "MG", "PHOS", "ALBUMIN", "PROT", "AST", "ALT", "ALKPHOS", "BILITOT", "BILIDIR", "IBILI" in the last 72 hours. Estimated  Creatinine Clearance: 194.5 mL/min (by C-G formula based on SCr of 0.65 mg/dL).    Past Medical History:  Diagnosis Date   Anxiety    Chronic pain syndrome    Depression    and panic/anxiety   Diabetes mellitus without complication (HCC)    Dyspnea    GERD (gastroesophageal reflux disease)    History of kidney stones    Hypertension    Insomnia    Kidney stones    Seizures (HCC) Childhood     Medications Prior to Admission  Medication Sig Dispense Refill Last Dose   allopurinol (ZYLOPRIM) 300 MG tablet Take 1 tablet (300 mg total) by mouth daily. 90 tablet 3 01/10/2022   ARIPiprazole (ABILIFY) 5 MG tablet Take 0.5 tablets (2.5 mg total) by mouth daily. 45 tablet 1 01/10/2022   busPIRone (BUSPAR) 5 MG tablet Take 1 tablet (5 mg total) by mouth 2 (two) times daily. 180 tablet 1 01/10/2022   Cholecalciferol (DIALYVITE VITAMIN D 5000) 125 MCG (5000 UT) capsule Take 5,000 Units by mouth daily.   01/10/2022   citric acid-potassium citrate (POLYCITRA) 1100-334 MG/5ML solution Take 5 mLs (10 mEq total) by mouth 3 (three) times daily. 473 mL 1 01/10/2022   colchicine 0.6 MG tablet TAKE ONE TABLET BY MOUTH TWICE A DAY AS NEEDED FOR GOUT FLARE 30 tablet 1 01/10/2022   escitalopram (LEXAPRO) 20 MG tablet Take 1 tablet (  20 mg total) by mouth daily. 90 tablet 1 01/10/2022   HYDROcodone-acetaminophen (NORCO/VICODIN) 5-325 MG tablet Take 1-2 tablets by mouth every 6 (six) hours as needed for severe pain (8 tabs or less per day). 240 tablet 0 01/11/2022 at 0300   HYDROmorphone (DILAUDID) 2 MG tablet Take 1 tablet (2 mg total) by mouth every 4 (four) hours as needed for severe pain. 30 tablet 0 Past Week   indapamide (LOZOL) 2.5 MG tablet Take 1 tablet (2.5 mg total) by mouth daily. 90 tablet 1 01/10/2022   lisinopril (ZESTRIL) 10 MG tablet Take 1 tablet (10 mg total) by mouth daily. 90 tablet 1 01/10/2022   loratadine (CLARITIN) 10 MG tablet Take 1 tablet (10 mg total) by mouth daily as needed for  allergies. 30 tablet 2 01/10/2022   Melatonin 5 MG LOZG Place 5 mg under the tongue at bedtime as needed (sleep).   Past Week   Multiple Vitamin (MULTIVITAMIN WITH MINERALS) TABS tablet Take 1 tablet by mouth daily.   01/10/2022   ondansetron (ZOFRAN-ODT) 8 MG disintegrating tablet Take 1 tablet (8 mg total) by mouth 3 (three) times daily as needed for nausea or vomiting. 30 tablet 2 01/10/2022   pantoprazole (PROTONIX) 40 MG tablet Take 1 tablet (40 mg total) by mouth 2 (two) times daily. 180 tablet 1 01/10/2022   polyethylene glycol powder (GLYCOLAX/MIRALAX) 17 GM/SCOOP powder MIX 17G IN 4 TO 8 OUNCES OF FLUID AND TAKE TWICE DAILY IF NEEDED 527 g 1    triamcinolone cream (KENALOG) 0.5 % Apply 1 application topically 2 (two) times daily. (Patient taking differently: Apply 1 application  topically 2 (two) times daily as needed (rash).) 30 g 2 Past Week   zolpidem (AMBIEN) 10 MG tablet TAKE ONE TABLET BY MOUTH EVERY NIGHT AT BEDTIME AS NEEDED FOR SLEEP (Patient taking differently: Take 10 mg by mouth at bedtime.) 90 tablet 0 01/10/2022   Calcium Carb-Cholecalciferol (CALCIUM 500 + D PO) Take 1 tablet by mouth in the morning, at noon, and at bedtime.      Melatonin 10 MG TABS Take 5-10 mg by mouth at bedtime.      Multiple Vitamins-Minerals (BARIATRIC MULTIVITAMINS/IRON) CAPS Take 1 capsule by mouth 2 (two) times daily.      tamsulosin (FLOMAX) 0.4 MG CAPS capsule Take 1 capsule (0.4 mg total) by mouth daily. (Patient taking differently: Take 0.4 mg by mouth daily as needed (kidney stones).) 30 capsule 0 More than a month     Royetta Asal, PharmD, BCPS 01/11/2022 1:51 PM

## 2022-01-11 NOTE — Anesthesia Postprocedure Evaluation (Signed)
Anesthesia Post Note  Patient: Casey Caldwell  Procedure(s) Performed: LAPAROSCOPIC ROUX-EN-Y GASTRIC BYPASS WITH UPPER ENDOSCOPY     Patient location during evaluation: PACU Anesthesia Type: General Level of consciousness: sedated Pain management: pain level controlled Vital Signs Assessment: post-procedure vital signs reviewed and stable Respiratory status: spontaneous breathing and respiratory function stable Cardiovascular status: stable Postop Assessment: no apparent nausea or vomiting Anesthetic complications: no  No notable events documented.  Last Vitals:  Vitals:   01/11/22 1145 01/11/22 1200  BP: (!) 179/112 (!) 152/87  Pulse: 72 68  Resp: 17 15  Temp:    SpO2: 96% 97%    Last Pain:  Vitals:   01/11/22 1219  TempSrc:   PainSc: 8                  Caelan Branden DANIEL

## 2022-01-11 NOTE — Progress Notes (Signed)
Discussed QI "Goals for Discharge" document with patient including ambulation in halls, Incentive Spirometry use every hour, and oral care.  Also discussed pain and nausea control. BSTOP education provided including BSTOP information guide, "Guide for Pain Management after your Bariatric Procedure".  Diet progression education provided including "Bariatric Surgery Post-Op Food Plan Phase 1: Liquids".  Questions answered.  Will continue to partner with bedside RN and follow up with patient per protocol.

## 2022-01-11 NOTE — TOC Benefit Eligibility Note (Signed)
Patient Teacher, English as a foreign language completed.    The patient is currently admitted and upon discharge could be taking enoxaparin (Lovenox) 60 mg/0.6 ml.  The current 14 day co-pay is $0.00.   The patient is insured through [place] Patient Advocate Specialist [place] Patient Advocate Team Direct Number: [phone]  Fax: [phone]

## 2022-01-11 NOTE — Progress Notes (Signed)
Attempted to call pt's sister again. LVM earlier. No answer on this attempt.  Leighton Ruff. Redmond Pulling, MD, FACS General, Bariatric, & Minimally Invasive Surgery [facility] Surgery,  [place]

## 2022-01-11 NOTE — Transfer of Care (Signed)
Immediate Anesthesia Transfer of Care Note  Patient: Casey Caldwell  Procedure(s) Performed: LAPAROSCOPIC ROUX-EN-Y GASTRIC BYPASS WITH UPPER ENDOSCOPY  Patient Location: PACU  Anesthesia Type:General  Level of Consciousness: awake, alert , and patient cooperative  Airway & Oxygen Therapy: Patient Spontanous Breathing and Patient connected to face mask oxygen  Post-op Assessment: Report given to RN and Post -op Vital signs reviewed and stable  Post vital signs: Reviewed and stable  Last Vitals:  Vitals Value Taken Time  BP 144/82 01/11/22 1005  Temp 37.2 C 01/11/22 1005  Pulse 90 01/11/22 1005  Resp 17 01/11/22 1005  SpO2 98 % 01/11/22 1005    Last Pain:  Vitals:   01/11/22 0616  TempSrc: Oral  PainSc:       Patients Stated Pain Goal: 0 (03/49/61 1643)  Complications: No notable events documented.

## 2022-01-11 NOTE — Op Note (Signed)
EDIEL UNANGST [id number] 09-21-1980. 01/11/2022  Preoperative diagnosis:  Severe obesity (BMI 51)  Primary hypertension  Chronic back pain greater than 3 months duration  Spinal cord stimulator status  Diabetes mellitus type 2 in obese  Vitamin D deficiency  Chronic, continuous use of opioids  Hepatic steatosis   Postoperative  diagnosis:  1. same  Surgical procedure: Laparoscopic Roux-en-Y gastric bypass (ante-colic, ante-gastric); upper endoscopy  Surgeon: Gayland Curry, M.D. FACS  Asst.: Louanna Raw MD   Anesthesia: General plus exparel/marcaine mix  Complications: None   EBL: 25cc   Drains: None   Disposition: PACU in good condition   Indications for procedure: 41 y.o. yo male with morbid obesity who has been unsuccessful at sustained weight loss. The patient's comorbidities are listed above. We discussed the risk and benefits of surgery including but not limited to anesthesia risk, bleeding, infection, blood clot formation, anastomotic leak, anastomotic stricture, ulcer formation, death, respiratory complications, intestinal blockage, internal hernia, gallstone formation, vitamin and nutritional deficiencies, injury to surrounding structures, failure to lose weight and mood changes.   Description of procedure: Patient is brought to the operating room and general anesthesia induced. The patient had received preoperative broad-spectrum IV antibiotics and subcutaneous heparin. The abdomen was widely sterilely prepped with Chloraprep and draped. Patient timeout was performed and correct patient and procedure confirmed. Access was obtained with a 109m Optiview trocar in the left upper quadrant and pneumoperitoneum established without difficulty. Under direct vision 12 mm trocars were placed laterally in the right upper quadrant, right upper quadrant midclavicular line, and to the left and above the umbilicus for the camera port. A 5 mm trocar was placed laterally in the  left upper quadrant.  Exparel/marcaine mix was infiltrated in bilateral lateral abdominal walls as a TAP block for postoperative pain relief.  The optical entry 5 mm trocar was exchanged for a 12 mm trocar..  The omentum was brought into the upper abdomen and the transverse mesocolon elevated and the ligament of Treitz clearly identified. A 50 cm biliopancreatic limb was then carefully measured from the ligament of Treitz. The small intestine was divided at this point with a single firing of the white load linear stapler. A Penrose drain was sutured to the end of the Roux-en-Y limb for later identification. A 100 cm Roux-en-Y limb was then carefully measured. At this point a side-to-side anastomosis was created between the Roux limb and the end of the biliopancreatic limb. This was accomplished with a single firing of the 60 mm white load linear stapler. The common enterotomy was closed with a running 2-0 Vicryl begun at either end of the enterotomy and tied centrally.  Vistaseal tissue sealant was placed over the anastomosis. The mesenteric defect was then closed with running 2-0 silk. The omentum was then divided with the harmonic scalpel up towards the transverse colon to allow mobility of the Roux limb toward the gastric pouch. The patient was then placed in steep reversed Trendelenburg. Through a 5 mm subxiphoid site the [facility] Center[facility] was placed and the left lobe of the liver elevated with excellent exposure of the upper stomach and hiatus.  Patient had hepatic steatosis.  The angle of Hiss was then mobilized with the harmonic scalpel. A 5 cm gastric pouch was then carefully measured along the lesser curve of the stomach. Dissection was carried along the lesser curve at this point with the Harmonic scalpel working carefully back toward the lesser sac at right angles to the lesser curve. The free lesser  sac was then entered. After being sure all tubes were removed from the stomach an initial firing of the  gold load 60 mm linear stapler was fired at right angles across the lesser curve for about 4 cm. The gastric pouch was further mobilized posteriorly and then the pouch was completed with 3 further firings of the 60 mm blue load linear stapler up through the previously dissected angle of His. It was ensured that the pouch was completely mobilized away from the gastric remnant. This created a nice tubular 5 cm gastric pouch. The Roux limb was then brought up in an antecolic fashion with the candycane facing to the patient's left without undue tension. The gastrojejunostomy was created with an initial posterior row of 2-0 Vicryl between the Roux limb and the staple line of the gastric pouch. Enterotomies were then made in the gastric pouch and the Roux limb with the harmonic scalpel and at approximately 2-2-1/2 cm anastomosis was created with a single firing of the 39m blue load linear stapler. The staple line was inspected and was intact without bleeding. The common enterotomy was then closed with running 2-0 Vicryl begun at either end and tied centrally. The Ewall tube was then easily passed through the anastomosis and an outer anterior layer of running 2-0 Vicryl was placed. The Ewald tube was removed. With the outlet of the gastrojejunostomy clamped and under saline irrigation the assistant performed upper endoscopy and with the gastric pouch tensely distended with air-there was no evidence of leak on this test. The pouch was desufflated. The PTerance Hartdefect was closed with running 2-0 silk. The abdomen was inspected for any evidence of bleeding or bowel injury and everything looked fine. The Nathanson retractor was removed under direct vision after coating the anastomosis with Vistaseal tissue sealant. All CO2 was evacuated and trochars removed. Skin incisions were closed with 4-0 monocryl in a subcuticular fashion followed by Dermabond.  The most lateral right-sided trocar site had a subcutaneous bleeding vessel  slightly underneath the dermal layer.  I ended up placing a 3-0 Vicryl suture for hemostasis and then closing the skin with a 4-0 Monocryl.  Sponge needle and instrument counts were correct. The patient was taken to the PACU in good condition.    ELeighton Ruff WRedmond Pulling MD, FACS General, Bariatric, & Minimally Invasive Surgery [facility], [place]

## 2022-01-11 NOTE — Interval H&P Note (Signed)
History and Physical Interval Note:  01/11/2022 7:27 AM  Casey Caldwell  has presented today for surgery, with the diagnosis of MORBID OBESITY.  The various methods of treatment have been discussed with the patient and family. After consideration of risks, benefits and other options for treatment, the patient has consented to  Procedure(s): LAPAROSCOPIC ROUX-EN-Y GASTRIC BYPASS WITH UPPER ENDOSCOPY (N/A) as a surgical intervention.  The patient's history has been reviewed, patient examined, no change in status, stable for surgery.  I have reviewed the patient's chart and labs.  Questions were answered to the patient's satisfaction.    Leighton Ruff. Redmond Pulling, MD, [place], Bariatric, & Minimally Invasive Surgery [facility],  [place]  Greer Pickerel

## 2022-01-11 NOTE — Discharge Instructions (Addendum)
GASTRIC BYPASS / SLEEVE  Home Care Instructions  These instructions are to help you care for yourself when you go home.  Call: If you have any problems. Call [phone] and ask for the surgeon on call If you have an emergency related to your surgery please use the ER at [place] [facility].  Tell the ER staff that you are a new post-op gastric bypass or gastric sleeve patient   Signs and symptoms to report: Severe vomiting or nausea If you cannot handle clear liquids for longer than 1 day, call your surgeon  Abdominal pain which does not get better after taking your pain medication Fever greater than 100.4 F and chills Heart rate over 100 beats a minute Trouble breathing Chest pain  Redness, swelling, drainage, or foul odor at incision (surgical) sites  If your incisions open or pull apart Swelling or pain in calf (lower leg) Diarrhea (Loose bowel movements that happen often), frequent watery, uncontrolled bowel movements Constipation, (no bowel movements for 3 days) if this happens:  Take Milk of Magnesia, 2 tablespoons by mouth, 3 times a day for 2 days if needed Stop taking Milk of Magnesia once you have had a bowel movement Call your doctor if constipation continues Or Take Miralax  (instead of Milk of Magnesia) following the label instructions Stop taking Miralax once you have had a bowel movement Call your doctor if constipation continues Anything you think is "abnormal for you"   Normal side effects after surgery: Unable to sleep at night or unable to concentrate Irritability Being tearful (crying) or depressed These are common complaints, possibly related to your anesthesia, stress of surgery and change in lifestyle, that usually go away a few weeks after surgery.  If these feelings continue, call your medical doctor.  Wound Care: You may have surgical glue, steri-strips, or staples over your incisions after surgery Surgical glue:  Looks like a clear film over your incisions  and will wear off a little at a time Steri-strips : Adhesive strips of tape over your incisions. You may notice a yellowish color on the skin under the steri-strips. This is used to make the   steri-strips stick better. Do not pull the steri-strips off - let them fall off Staples: Staples may be removed before you leave the hospital If you go home with staples, call [facility] at for an appointment with your surgeon's nurse to have staples removed 10 days after surgery, [phone] Showering: You may shower two (2) days after your surgery unless your surgeon tells you differently Wash gently around incisions with warm soapy water, rinse well, and gently pat dry  If you have a drain (tube from your incision), you may need someone to hold this while you shower  No tub baths until staples are removed and incisions are healed     Medications: Medications should be liquid or crushed if larger than the size of a dime Extended release pills (medication that releases a little bit at a time through the day) should not be crushed Depending on the size and number of medications you take, you may need to space (take a few throughout the day)/change the time you take your medications so that you do not over-fill your pouch (smaller stomach) Make sure you follow-up with your primary care physician to make medication changes needed during rapid weight loss and life-style changes If you have diabetes, follow up with the doctor that orders your diabetes medication(s) within one week after surgery and check   your blood sugar regularly. Do not drive while taking narcotics (pain medications) DO NOT take NSAID'S (Examples of NSAID's include ibuprofen, naproxen)  Diet:                    First 2 Weeks  You will see the nutritionist about two (2) weeks after your surgery. The nutritionist will increase the types of foods you can eat if you are handling liquids well: If you have severe vomiting or nausea  and cannot handle clear liquids lasting longer than 1 day, call your surgeon  Protein Shake Drink at least 2 ounces of shake 5-6 times per day Each serving of protein shakes (usually 8 - 12 ounces) should have a minimum of:  15 grams of protein  And no more than 5 grams of carbohydrate  Goal for protein each day: Men = 80 grams per day Women = 60 grams per day Protein powder may be added to fluids such as non-fat milk or Lactaid milk or Soy milk (limit to 35 grams added protein powder per serving)  Hydration Slowly increase the amount of water and other clear liquids as tolerated (See Acceptable Fluids) Slowly increase the amount of protein shake as tolerated   Sip fluids slowly and throughout the day May use sugar substitutes in small amounts (no more than 6 - 8 packets per day; i.e. Splenda)  Fluid Goal The first goal is to drink at least 8 ounces of protein shake/drink per day (or as directed by the nutritionist);  See handout from pre-op Bariatric Education Class for examples of protein shake/drink.   Slowly increase the amount of protein shake you drink as tolerated You may find it easier to slowly sip shakes throughout the day It is important to get your proteins in first Your fluid goal is to drink 64 - 100 ounces of fluid daily It may take a few weeks to build up to this 32 oz (or more) should be clear liquids  And  32 oz (or more) should be full liquids (see below for examples) Liquids should not contain sugar, caffeine, or carbonation  Clear Liquids: Water or Sugar-free flavored water (i.e. Fruit H2O, Propel) Decaffeinated coffee or tea (sugar-free) Akanksha Bellmore Lite, Wyler's Lite, Minute Maid Lite Sugar-free Jell-O Bouillon or broth Sugar-free Popsicle:   *Less than 20 calories each; Limit 1 per day  Full Liquids: Protein Shakes/Drinks + 2 choices per day of other full liquids Full liquids must be: No More Than 12 grams of Carbs per serving  No More Than 3 grams of Fat  per serving Strained low-fat cream soup Non-Fat milk Fat-free Lactaid Milk Sugar-free yogurt (Dannon Lite & Fit, Greek yogurt)      Vitamins and Minerals Start 1 day after surgery unless otherwise directed by your surgeon Bariatric Specific Complete Multivitamins Chewable Calcium Citrate with Vitamin D-3 (Example: 3 Chewable Calcium Plus 600 with Vitamin D-3) Take 500 mg three (3) times a day for a total of 1500 mg each day Do not take all 3 doses of calcium at one time as it may cause constipation, and you can only absorb 500 mg  at a time  Do not mix multivitamins containing iron with calcium supplements; take 2 hours apart  Menstruating women and those at risk for anemia (a blood disease that causes weakness) may need extra iron Talk with your doctor to see if you need more iron If you need extra iron: Total daily Iron recommendation (including Vitamins) is 50 to 100   mg Iron/day Do not stop taking or change any vitamins or minerals until you talk to your nutritionist or surgeon Your nutritionist and/or surgeon must approve all vitamin and mineral supplements   Activity and Exercise: It is important to continue walking at home.  Limit your physical activity as instructed by your doctor.  During this time, use these guidelines: Do not lift anything greater than ten (10) pounds for at least two (2) weeks Do not go back to work or drive until Engineer, production says you can You may have sex when you feel comfortable  It is VERY important for male patients to use a reliable birth control method; fertility often increases after surgery  Do not get pregnant for at least 18 months Start exercising as soon as your doctor tells you that you can Make sure your doctor approves any physical activity Start with a simple walking program Walk 5-15 minutes each day, 7 days per week.  Slowly increase until you are walking 30-45 minutes per day Consider joining our [place] program. [phone] or email  [email or web address]   Special Instructions Things to remember:  Use your CPAP when sleeping if this applies to you, do not stop the use of CPAP unless directed by physician after a sleep study [facility] has a free Bariatric Surgery Support Group that meets monthly, the 3rd Thursday, 6 pm.  Please review discharge information for date and location of this meeting. It is very important to keep all follow up appointments with your surgeon, nutritionist, primary care physician, and behavioral health practitioner After the first year, please follow up with your bariatric surgeon and nutritionist at least once a year in order to maintain best weight loss results   [place]: [place]: [phone] Bariatric Nurse Coordinator: [phone]     Enoxaparin (Lovenox) Self- Injection Instructions Wash your hands with soap and water Find a spot on the left or right side of the abdomen  2 inches from umbilicus "belly button"- DO NOT INJECT INTO INCISIONS Make sure to rotate sites (Do not inject in the same spot twice in a row) Clean the spot with alcohol swab, LET DRY Pull cap straight off, do not twist (do not let needle touch anything) Ok to leave in air bubble Place syringe in dominant (writing) hand, take other hand to "pinch an inch" (same area that you cleaned with the alcohol swab) Insert the entire needle into the fold of skin at a 90-degree angle Press the plunger all the way down with your thumb while the other hand keeps "pinching" the skin (Make sure to get all the medicine) Pull the needle straight out, then let go of the skin To activate the safety shield, hold the plunger away from yourself (and anyone else if necessary) Press with your thumb until you hear a "click" and the safety shield activates Place the used syringe and cap into the sharps disposal container.

## 2022-01-11 NOTE — Plan of Care (Signed)
  Problem: Coping: Goal: Level of anxiety will decrease Outcome: Progressing   Problem: Pain Managment: Goal: General experience of comfort will improve Outcome: Progressing   

## 2022-01-11 NOTE — Progress Notes (Signed)
.   Transition of Care [facility]) Screening Note   Patient Details  Name: Casey Caldwell Date of Birth: [birth year]   Transition of Care [facility]) CM/SW Contact:    Illene Regulus, LCSW Phone Number: 01/11/2022, 2:24 PM    Transition of Care Department [facility]) has reviewed patient and no TOC needs have been identified at this time. We will continue to monitor patient advancement through interdisciplinary progression rounds. If new patient transition needs arise, please place a TOC consult.

## 2022-01-12 ENCOUNTER — Encounter: Payer: Self-pay | Admitting: Family Medicine

## 2022-01-12 ENCOUNTER — Encounter (HOSPITAL_COMMUNITY): Payer: Self-pay | Admitting: General Surgery

## 2022-01-12 LAB — CBC WITH DIFFERENTIAL/PLATELET
Abs Immature Granulocytes: 0.08 10*3/uL — ABNORMAL HIGH (ref 0.00–0.07)
Basophils Absolute: 0 10*3/uL (ref 0.0–0.1)
Basophils Relative: 0 %
Eosinophils Absolute: 0 10*3/uL (ref 0.0–0.5)
Eosinophils Relative: 0 %
HCT: 48.1 % (ref 39.0–52.0)
Hemoglobin: 15.8 g/dL (ref 13.0–17.0)
Immature Granulocytes: 1 %
Lymphocytes Relative: 12 %
Lymphs Abs: 2 10*3/uL (ref 0.7–4.0)
MCH: 30.3 pg (ref 26.0–34.0)
MCHC: 32.8 g/dL (ref 30.0–36.0)
MCV: 92.3 fL (ref 80.0–100.0)
Monocytes Absolute: 1.5 10*3/uL — ABNORMAL HIGH (ref 0.1–1.0)
Monocytes Relative: 9 %
Neutro Abs: 13.3 10*3/uL — ABNORMAL HIGH (ref 1.7–7.7)
Neutrophils Relative %: 78 %
Platelets: 345 10*3/uL (ref 150–400)
RBC: 5.21 MIL/uL (ref 4.22–5.81)
RDW: 14.7 % (ref 11.5–15.5)
WBC: 16.9 10*3/uL — ABNORMAL HIGH (ref 4.0–10.5)
nRBC: 0 % (ref 0.0–0.2)

## 2022-01-12 LAB — GLUCOSE, CAPILLARY
Glucose-Capillary: 110 mg/dL — ABNORMAL HIGH (ref 70–99)
Glucose-Capillary: 101 mg/dL — ABNORMAL HIGH (ref 70–99)
Glucose-Capillary: 98 mg/dL (ref 70–99)
Glucose-Capillary: 90 mg/dL (ref 70–99)

## 2022-01-12 LAB — COMPREHENSIVE METABOLIC PANEL
ALT: 78 U/L — ABNORMAL HIGH (ref 0–44)
AST: 47 U/L — ABNORMAL HIGH (ref 15–41)
Albumin: 3.7 g/dL (ref 3.5–5.0)
Alkaline Phosphatase: 50 U/L (ref 38–126)
Anion gap: 8 (ref 5–15)
BUN: 15 mg/dL (ref 6–20)
CO2: 28 mmol/L (ref 22–32)
Calcium: 8.7 mg/dL — ABNORMAL LOW (ref 8.9–10.3)
Chloride: 101 mmol/L (ref 98–111)
Creatinine, Ser: 1.07 mg/dL (ref 0.61–1.24)
GFR, Estimated: >60 mL/min (ref >60)
Glucose, Bld: 109 mg/dL — ABNORMAL HIGH (ref 70–99)
Potassium: 5.1 mmol/L (ref 3.5–5.1)
Sodium: 137 mmol/L (ref 135–145)
Total Bilirubin: 0.8 mg/dL (ref 0.3–1.2)
Total Protein: 6.6 g/dL (ref 6.5–8.1)

## 2022-01-12 MED ORDER — ENOXAPARIN SODIUM 60 MG/0.6ML IJ SOSY: 60.0000 mg | PREFILLED_SYRINGE | Freq: Two times a day (BID) | INTRAMUSCULAR | 0 refills | Status: DC

## 2022-01-12 MED ORDER — ONDANSETRON 4 MG PO TBDP: 4.0000 mg | ORAL_TABLET | Freq: Four times a day (QID) | ORAL | 0 refills | Status: DC | PRN

## 2022-01-12 NOTE — Progress Notes (Addendum)
Patient alert and oriented, pain is controlled. Patient is tolerating fluids, advanced to protein shake today, patient is tolerating well. Reviewed Gastric Bypass discharge instructions with patient and patient is able to articulate understanding. Provided information on BELT program, Support Group and [facility] outpatient pharmacy. All questions answered, will continue to monitor.  Reviewed Lovenox teaching instructions and pt reviewed Lovenox "Patient-Self Injection Video".

## 2022-01-12 NOTE — Plan of Care (Signed)
Patient is stable for discharge. Discharge instructions have been given. All questions have been answered, patient is discharged home with family.

## 2022-01-13 ENCOUNTER — Other Ambulatory Visit: Payer: Self-pay | Admitting: Family Medicine

## 2022-01-13 ENCOUNTER — Telehealth: Payer: Self-pay | Admitting: *Deleted

## 2022-01-13 DIAGNOSIS — G8929 Other chronic pain: Secondary | ICD-10-CM

## 2022-01-13 MED ORDER — HYDROCODONE-ACETAMINOPHEN 5-325 MG PO TABS: 1.0000 | ORAL_TABLET | Freq: Four times a day (QID) | ORAL | 0 refills | Status: DC | PRN

## 2022-01-13 MED ORDER — HYDROMORPHONE HCL 2 MG PO TABS: 2.0000 mg | ORAL_TABLET | ORAL | 0 refills | Status: DC | PRN

## 2022-01-13 NOTE — Patient Outreach (Signed)
  Care Coordination [facility] Note Transition Care Management Unsuccessful Follow-up Telephone Call  Date of discharge and from where:  01/12/22 from [facility]  Attempts:  1st Attempt  Reason for unsuccessful TCM follow-up call:  Left voice message   Lurena Joiner RN, [place] RN Care Coordinator

## 2022-01-13 NOTE — Discharge Summary (Signed)
Physician Discharge Summary  Casey Caldwell:[id number] DOB: [birth year] DOA: 01/11/2022  PCP: Tonia Ghent, MD  Admit date: 01/11/2022 Discharge date: 01/12/2022  Recommendations for Outpatient Follow-up:     Follow-up Information     Greer Pickerel, MD. Go on 02/09/2022.   Specialty: General Surgery Why: at 10:15am. Please arrive 15 minutes prior to your appointment time. Thank you. Contact information: [address] [place] [address] [phone]         Maczis, Carlena Hurl, [place]. Go on 03/08/2022.   Specialty: General Surgery Why: at 2:15pm for Dr. Redmond Pulling. Please arrive 15 minutes prior to your appointment time. Thank you. Contact information: [place] [address] [phone]                Discharge Diagnoses:  Principal Problem:   S/P gastric bypass Severe obesity (BMI 51)  Primary hypertension  Chronic back pain greater than 3 months duration  Spinal cord stimulator status  Diabetes mellitus type 2 in obese  Vitamin D deficiency  Chronic, continuous use of opioids  Hepatic steatosis   Surgical Procedure: Laparoscopic Roux-en-Y gastric bypass, upper endoscopy  Discharge Condition: Good Disposition: Home  Diet recommendation: Postoperative gastric bypass diet  Filed Weights   01/11/22 0601 01/11/22 0616  Weight: (!) 172.4 kg (!) 172.4 kg     Hospital Course:  The patient was admitted for a planned laparoscopic Roux-en-Y gastric bypass. Please see operative note. Preoperatively the patient was given 5000 units of subcutaneous heparin for DVT prophylaxis. ERAS protocol was used. Postoperative prophylactic heparin dosing was started on the evening of postoperative day 0.  The patient was started on ice chips and water on the evening of POD 0 which they tolerated. On postoperative day 1 The patient's diet was advanced to protein shakes which they also tolerated. On POD1 , The patient was ambulating  without difficulty. Their vital signs are stable without fever or tachycardia. Their hemoglobin had remained stable. The patient was maintained on their home settings for CPAP therapy. The patient had received discharge instructions and counseling. They were deemed stable for discharge.  His postop VTE risk calculator score met threshold for extended VTE prophylaxis after discharge. He was sent out on bid lovenox 44m for 2 weeks. He was instructed on proper use  Because of his h/o Kidney stones, we will arrange outpt IVF next week to minimize risk of postop dehydration.   BP 128/68 (BP Location: Right Arm)   Pulse 66   Temp 98 F (36.7 C) (Oral)   Resp 18   Ht 6' (1.829 m)   Wt (!) 172.4 kg   SpO2 99%   BMI 51.54 kg/m   Gen: alert, NAD, non-toxic appearing Pupils: equal, no scleral icterus Pulm: Lungs clear to auscultation, symmetric chest rise CV: regular rate and rhythm Abd: soft, min tender, nondistended. No cellulitis. No incisional hernia Ext: no edema, no calf tenderness Skin: no rash, no jaundice  Discharge Instructions  Discharge Instructions     Ambulate hourly while awake   Complete by: As directed    Call MD for:  difficulty breathing, headache or visual disturbances   Complete by: As directed    Call MD for:  persistant dizziness or light-headedness   Complete by: As directed    Call MD for:  persistant nausea and vomiting   Complete by: As directed    Call MD for:  redness, tenderness, or signs of infection (pain, swelling, redness,  odor or green/yellow discharge around incision site)   Complete by: As directed    Call MD for:  severe uncontrolled pain   Complete by: As directed    Call MD for:  temperature >101 F   Complete by: As directed    Diet bariatric full liquid   Complete by: As directed    Discharge instructions   Complete by: As directed    See bariatric discharge instructions   Incentive spirometry   Complete by: As directed    Perform  hourly while awake      Allergies as of 01/12/2022       Reactions   Gabapentin    Intolerant, sedation    Oxycodone    "feels like I am going to crawl out of my skin"   Phenergan [promethazine Hcl]    Unknown reaction   Tape Hives   Adhesive tape   Toradol [ketorolac Tromethamine]    "feels like I am going to crawl out of my skin"   Trileptal [oxcarbazepine] Other (See Comments)   Profound insomnia, worsening mood.    Wellbutrin [bupropion]    H/o SZ d/o. Causes mood swings        Medication List     STOP taking these medications    Melatonin 10 MG Tabs   Melatonin 5 MG Lozg   multivitamin with minerals Tabs tablet       TAKE these medications    allopurinol 300 MG tablet Commonly known as: ZYLOPRIM Take 1 tablet (300 mg total) by mouth daily.   ARIPiprazole 5 MG tablet Commonly known as: ABILIFY Take 0.5 tablets (2.5 mg total) by mouth daily.   Bariatric Multivitamins/Iron Caps Take 1 capsule by mouth 2 (two) times daily.   busPIRone 5 MG tablet Commonly known as: BUSPAR Take 1 tablet (5 mg total) by mouth 2 (two) times daily.   CALCIUM 500 + D PO Take 1 tablet by mouth in the morning, at noon, and at bedtime.   citric acid-potassium citrate 1100-334 MG/5ML solution Commonly known as: POLYCITRA Take 5 mLs (10 mEq total) by mouth 3 (three) times daily.   colchicine 0.6 MG tablet TAKE ONE TABLET BY MOUTH TWICE A DAY AS NEEDED FOR GOUT FLARE   Dialyvite Vitamin D 5000 125 MCG (5000 UT) capsule Generic drug: Cholecalciferol Take 5,000 Units by mouth daily.   enoxaparin 60 MG/0.6ML injection Commonly known as: LOVENOX Inject 0.6 mLs (60 mg total) into the skin every 12 (twelve) hours for 14 days.   escitalopram 20 MG tablet Commonly known as: LEXAPRO Take 1 tablet (20 mg total) by mouth daily.   HYDROcodone-acetaminophen 5-325 MG tablet Commonly known as: NORCO/VICODIN Take 1-2 tablets by mouth every 6 (six) hours as needed for severe pain  (8 tabs or less per day).   HYDROmorphone 2 MG tablet Commonly known as: DILAUDID Take 1 tablet (2 mg total) by mouth every 4 (four) hours as needed for severe pain.   indapamide 2.5 MG tablet Commonly known as: LOZOL Take 1 tablet (2.5 mg total) by mouth daily. Notes to patient: Monitor Blood Pressure Daily and keep a log for primary care physician.  Monitor for symptoms of dehydration.  You may need to make changes to your medications with rapid weight loss.      lisinopril 10 MG tablet Commonly known as: ZESTRIL Take 1 tablet (10 mg total) by mouth daily. Notes to patient: Monitor Blood Pressure Daily and keep a log for primary care physician.  You   may need to make changes to your medications with rapid weight loss.      loratadine 10 MG tablet Commonly known as: CLARITIN Take 1 tablet (10 mg total) by mouth daily as needed for allergies.   ondansetron 4 MG disintegrating tablet Commonly known as: ZOFRAN-ODT Take 1 tablet (4 mg total) by mouth every 6 (six) hours as needed for nausea or vomiting. What changed:  medication strength how much to take when to take this   pantoprazole 40 MG tablet Commonly known as: PROTONIX Take 1 tablet (40 mg total) by mouth 2 (two) times daily.   polyethylene glycol powder 17 GM/SCOOP powder Commonly known as: GLYCOLAX/MIRALAX MIX 17G IN 4 TO 8 OUNCES OF FLUID AND TAKE TWICE DAILY IF NEEDED   tamsulosin 0.4 MG Caps capsule Commonly known as: FLOMAX Take 1 capsule (0.4 mg total) by mouth daily. What changed:  when to take this reasons to take this   triamcinolone cream 0.5 % Commonly known as: KENALOG Apply 1 application topically 2 (two) times daily. What changed:  when to take this reasons to take this   zolpidem 10 MG tablet Commonly known as: AMBIEN TAKE ONE TABLET BY MOUTH EVERY NIGHT AT BEDTIME AS NEEDED FOR SLEEP What changed:  how much to take how to take this when to take this additional instructions         Follow-up Information     Greer Pickerel, MD. Go on 02/09/2022.   Specialty: General Surgery Why: at 10:15am. Please arrive 15 minutes prior to your appointment time. Thank you. Contact information: [address] [place] [address] [phone]         Maczis, Carlena Hurl, [place]. Go on 03/08/2022.   Specialty: General Surgery Why: at 2:15pm for Dr. Redmond Pulling. Please arrive 15 minutes prior to your appointment time. Thank you. Contact information: [address] [place] [address] [phone]                  The results of significant diagnostics from this hospitalization (including imaging, microbiology, ancillary and laboratory) are listed below for reference.    Significant Diagnostic Studies: No results found.  Labs: Basic Metabolic Panel: Recent Labs  Lab 01/07/22 1130 01/11/22 1255 01/12/22 0533  NA 140  --  137  K 4.4  --  5.1  CL 103  --  101  CO2 28  --  28  GLUCOSE 90  --  109*  BUN 15  --  15  CREATININE 0.65 1.16 1.07  CALCIUM 9.6  --  8.7*   Liver Function Tests: Recent Labs  Lab 01/07/22 1130 01/12/22 0533  AST 96* 47*  ALT 129* 78*  ALKPHOS 65 50  BILITOT 0.8 0.8  PROT 7.7 6.6  ALBUMIN 4.6 3.7    CBC: Recent Labs  Lab 01/07/22 1130 01/11/22 1255 01/12/22 0533  WBC 10.1 19.2* 16.9*  NEUTROABS 6.2  --  13.3*  HGB 17.8* 17.8* 15.8  HCT 52.4* 53.6* 48.1  MCV 89.4 90.7 92.3  PLT 346 339 345    CBG: Recent Labs  Lab 01/11/22 2345 01/12/22 0405 01/12/22 0756 01/12/22 1144 01/12/22 1631  GLUCAP 115* 110* 101* 98 90    Principal Problem:   S/P gastric bypass   Time coordinating discharge: 15 min  Signed:  Gayland Curry, MD [facility], [place] [phone] 01/13/2022, 11:14 AM

## 2022-01-14 ENCOUNTER — Other Ambulatory Visit: Payer: Self-pay | Admitting: General Surgery

## 2022-01-14 ENCOUNTER — Telehealth (HOSPITAL_COMMUNITY): Payer: Self-pay | Admitting: *Deleted

## 2022-01-14 NOTE — Telephone Encounter (Signed)
1.  Tell me about your pain and pain management? Pt denies any surgical pain that is not tolerable.  Pt states that he is continuing to follow the pain medicine regimen that he was on prior to the surgery. Pt did state the he was having some intermittent left thigh "burning" when we would be in certain positions.  Instructed pt to f/u with surgeon if pain continues or worsens.   2.  Let's talk about fluid intake.  How much total fluid are you taking in? Pt states that he is getting in more than 64oz of fluid including protein shakes, bottled water, and soup.  Pt instructed to assess status and suggestions daily utilizing Hydration Action Plan on discharge folder and to call [facility] if in the "red zone". Pt is scheduled for IVF on Wed. 01/19/22 due to hx of kidney stones.  3.  How much protein have you taken in the last 2 days? Pt states he is meeting his goal of 80g of protein each day with the protein shakes.  4.  Have you had nausea?  Tell me about when have experienced nausea and what you did to help? Pt denies nausea.   5.  Has the frequency or color changed with your urine? Pt states that he is urinating "fine" with no changes in frequency or urgency.     6.  Tell me what your incisions look like? "Incisions look fine". Pt denies a fever, chills.  Pt states incisions are not swollen, open, or draining.  Pt encouraged to call [facility] if incisions change.   7.  Have you been passing gas? BM? Pt states that he has not had a BM.  Pt instructed to take either Miralax or MoM as instructed per "Gastric Bypass/Sleeve Discharge Home Care Instructions".  Pt to call surgeon's office if not able to have BM with medication.   8.  If a problem or question were to arise who would you call?  Do you know contact numbers for [facility], [facility], and [facility]? Pt denies dehydration symptoms.  Pt can describe s/sx of dehydration.  Pt knows to call [facility] for surgical, [facility] for nutrition, and [facility] for non-urgent questions or concerns.    9.  How has the walking going? Pt states he is walking around and able to be active without difficulty.   10. Are you still using your incentive spirometer?  If so, how often? Pt states that he is doing the I.S.   Pt encouraged to use incentive spirometer, at least 10x every hour while awake until he sees the surgeon.  11.  How are your vitamins and calcium going?  How are you taking them? Pt states that he is taking his supplements and vitamins without difficulty.  LOVENOX: Pt states that he is taking the Lovenox injections without difficulty.  Reinforced education about proper hand hygiene, taking injections q12h and rotating injection sites.  Pt also instructed to monitor for unusual bruising and/or signs of bleeding.  Reminded patient that the first 30 days post-operatively are important for successful recovery.  Practice good hand hygiene, wearing a mask when appropriate (since optional in most places), and minimizing exposure to people who live outside of the home, especially if they are exhibiting any respiratory, GI, or illness-like symptoms.

## 2022-01-25 ENCOUNTER — Ambulatory Visit: Payer: Commercial Managed Care - HMO | Admitting: Family Medicine

## 2022-01-25 ENCOUNTER — Encounter: Payer: Commercial Managed Care - HMO | Attending: General Surgery | Admitting: Dietician

## 2022-01-25 ENCOUNTER — Encounter: Payer: Self-pay | Admitting: Dietician

## 2022-01-25 ENCOUNTER — Encounter: Payer: Self-pay | Admitting: Family Medicine

## 2022-01-25 VITALS — BP 118/78 | HR 80 | Temp 96.8°F | Ht 72.0 in | Wt 364.0 lb

## 2022-01-25 VITALS — Ht 71.5 in | Wt 362.2 lb

## 2022-01-25 DIAGNOSIS — M109 Gout, unspecified: Secondary | ICD-10-CM

## 2022-01-25 DIAGNOSIS — E119 Type 2 diabetes mellitus without complications: Secondary | ICD-10-CM | POA: Diagnosis not present

## 2022-01-25 DIAGNOSIS — E669 Obesity, unspecified: Secondary | ICD-10-CM | POA: Diagnosis not present

## 2022-01-25 DIAGNOSIS — I10 Essential (primary) hypertension: Secondary | ICD-10-CM | POA: Diagnosis not present

## 2022-01-25 DIAGNOSIS — M549 Dorsalgia, unspecified: Secondary | ICD-10-CM

## 2022-01-25 DIAGNOSIS — G8929 Other chronic pain: Secondary | ICD-10-CM

## 2022-01-25 LAB — CBC WITH DIFFERENTIAL/PLATELET
Basophils Absolute: 0.1 10*3/uL (ref 0.0–0.1)
Basophils Relative: 0.9 % (ref 0.0–3.0)
Eosinophils Absolute: 1.4 10*3/uL — ABNORMAL HIGH (ref 0.0–0.7)
Eosinophils Relative: 13 % — ABNORMAL HIGH (ref 0.0–5.0)
HCT: 49.7 % (ref 39.0–52.0)
Hemoglobin: 16.8 g/dL (ref 13.0–17.0)
Lymphocytes Relative: 28.1 % (ref 12.0–46.0)
Lymphs Abs: 3 10*3/uL (ref 0.7–4.0)
MCHC: 33.8 g/dL (ref 30.0–36.0)
MCV: 90 fl (ref 78.0–100.0)
Monocytes Absolute: 1 10*3/uL (ref 0.1–1.0)
Monocytes Relative: 9.2 % (ref 3.0–12.0)
Neutro Abs: 5.2 10*3/uL (ref 1.4–7.7)
Neutrophils Relative %: 48.8 % (ref 43.0–77.0)
Platelets: 400 10*3/uL (ref 150.0–400.0)
RBC: 5.52 Mil/uL (ref 4.22–5.81)
RDW: 14.9 % (ref 11.5–15.5)
WBC: 10.7 10*3/uL — ABNORMAL HIGH (ref 4.0–10.5)

## 2022-01-25 LAB — COMPREHENSIVE METABOLIC PANEL
ALT: 60 U/L — ABNORMAL HIGH (ref 0–53)
AST: 46 U/L — ABNORMAL HIGH (ref 0–37)
Albumin: 4.5 g/dL (ref 3.5–5.2)
Alkaline Phosphatase: 82 U/L (ref 39–117)
BUN: 13 mg/dL (ref 6–23)
CO2: 33 mEq/L — ABNORMAL HIGH (ref 19–32)
Calcium: 10.3 mg/dL (ref 8.4–10.5)
Chloride: 99 mEq/L (ref 96–112)
Creatinine, Ser: 0.78 mg/dL (ref 0.40–1.50)
GFR: 109.05 mL/min (ref >60.00)
Glucose, Bld: 97 mg/dL (ref 70–99)
Potassium: 4.2 mEq/L (ref 3.5–5.1)
Sodium: 140 mEq/L (ref 135–145)
Total Bilirubin: 0.6 mg/dL (ref 0.2–1.2)
Total Protein: 6.9 g/dL (ref 6.0–8.3)

## 2022-01-25 MED ORDER — HYDROMORPHONE HCL 2 MG PO TABS: 2.0000 mg | ORAL_TABLET | ORAL | 0 refills | Status: DC | PRN

## 2022-01-25 NOTE — Progress Notes (Addendum)
2 Week Post-Operative Nutrition Class   Patient was seen on 01/25/2022 for Post-Operative Nutrition education at the [facility].    Surgery date: 01/11/2022 Surgery type: RYGB  Anthropometrics  Start weight at [facility]: 389.4 lbs (date: 11/23/2021)  Height: 71.5 in Weight today: 362.2 lbs BMI: 49.81 kg/m2     Clinical  Medical hx: Spinal stenosis with chronic pain syndrome, hypertension, GERD, hepatic steatosis, history of kidney stones, gout Medications: Hydrocodone, hydromorphone, Zofran, pot citrate, xyloprim, ambion, abilify, buspirone, Vit D  Labs: A1C 6.7; Vit D Notable signs/symptoms: none noted Any previous deficiencies? No Bowel Habits: Every day to every other day no complaints   Body Composition Scale 01/25/2022  Current Body Weight 362.2  Total Body Fat % 40.2  Visceral Fat 36  Fat-Free Mass % 59.7   Total Body Water % 40.7  Muscle-Mass lbs 68.4  BMI 49.3  Body Fat Displacement          Torso  lbs 90.6         Left Leg  lbs 18.1         Right Leg  lbs 18.1         Left Arm  lbs 9.0         Right Arm   lbs 9.0      The following the learning objectives were met by the patient during this course: Identifies Phase 3 (Soft, High Proteins) Dietary Goals and will begin from 2 weeks post-operatively to 2 months post-operatively Identifies appropriate sources of fluids and proteins  Identifies appropriate fat sources and healthy verses unhealthy fat types   States protein recommendations and appropriate sources post-operatively Identifies the need for appropriate texture modifications, mastication, and bite sizes when consuming solids Identifies appropriate fat consumption and sources Identifies appropriate multivitamin and calcium sources post-operatively Describes the need for physical activity post-operatively and will follow MD recommendations States when to call healthcare provider regarding medication questions or post-operative  complications   Handouts given during class include: Phase 3A: Soft, High Protein Diet Handout Phase 3 High Protein Meals Healthy Fats   Follow-Up Plan: Patient will follow-up at [facility] in 6 weeks for 2 month post-op nutrition visit for diet advancement per MD.

## 2022-01-25 NOTE — Patient Instructions (Addendum)
If you are getting lightheaded on standing, then stop lisinopril.   Go to the lab on the way out.   If you have mychart we'll likely use that to update you.    Take care.  Glad to see you. Thanks for your effort.

## 2022-01-25 NOTE — Progress Notes (Signed)
F/u from surgery.  He used hydrocodone for post op pain when he couldn't swallow hydrocodone at the time. He has been able to take his pills in the meantime.    He had L thigh burning pain since surgery, L lateral thigh.  He messaged the surgery clinic in the meantime, awaiting response.  Turning off his stimulator didn't make a difference.  Noted since gastric surgery.    He is down 30 lbs.  Still on lovenox.  Back onto semisold foods.  Meeting with nutrition today.    Not lightheaded, d/w pt about lisinopril taper.  See AVS.    He had to take colchicine prn for gout in the meantime.    Meds, vitals, and allergies reviewed.   ROS: Per HPI unless specifically indicated in ROS section   Abd bruising but healed.

## 2022-01-26 NOTE — Assessment & Plan Note (Signed)
We can recheck this later on.  Hopefully this will resolve as his weight comes down.

## 2022-01-26 NOTE — Assessment & Plan Note (Addendum)
See notes on after visit summary about tapering lisinopril.  He may not need this anymore when his weight decreases.  See notes on labs.

## 2022-01-26 NOTE — Assessment & Plan Note (Signed)
Had to use colchicine in the meantime, can continue with as needed use.

## 2022-01-26 NOTE — Assessment & Plan Note (Addendum)
He had to use hydromorphone for breakthrough postop pain.  Refill sent in the meantime.  He does not need refill on hydrocodone.  He is able to take his pills as is now.  He is checking with the surgery clinic about his left thigh pain that is noted since his surgery.  I will await surgery clinic input.

## 2022-01-31 ENCOUNTER — Telehealth: Payer: Self-pay | Admitting: Dietician

## 2022-01-31 NOTE — Telephone Encounter (Signed)
RD called pt to verify fluid intake once starting soft, solid proteins 2 week post-bariatric surgery.   Daily Fluid intake:  Daily Protein intake:  Bowel Habits:   Concerns/issues:    Left Voice Message

## 2022-02-03 DIAGNOSIS — R1084 Generalized abdominal pain: Secondary | ICD-10-CM | POA: Diagnosis not present

## 2022-02-04 ENCOUNTER — Encounter (HOSPITAL_COMMUNITY): Payer: Self-pay

## 2022-02-04 ENCOUNTER — Emergency Department (HOSPITAL_COMMUNITY): Payer: Medicaid Other

## 2022-02-04 ENCOUNTER — Emergency Department (HOSPITAL_COMMUNITY)
Admission: EM | Admit: 2022-02-04 | Discharge: 2022-02-04 | Disposition: A | Discharge status: Home / Self Care | Payer: Medicaid Other | Attending: Emergency Medicine | Admitting: Emergency Medicine

## 2022-02-04 ENCOUNTER — Other Ambulatory Visit: Payer: Self-pay

## 2022-02-04 DIAGNOSIS — R0789 Other chest pain: Secondary | ICD-10-CM | POA: Diagnosis not present

## 2022-02-04 DIAGNOSIS — R109 Unspecified abdominal pain: Secondary | ICD-10-CM | POA: Diagnosis not present

## 2022-02-04 DIAGNOSIS — R079 Chest pain, unspecified: Secondary | ICD-10-CM | POA: Diagnosis not present

## 2022-02-04 DIAGNOSIS — R1013 Epigastric pain: Secondary | ICD-10-CM | POA: Diagnosis not present

## 2022-02-04 DIAGNOSIS — R110 Nausea: Secondary | ICD-10-CM | POA: Diagnosis not present

## 2022-02-04 DIAGNOSIS — R112 Nausea with vomiting, unspecified: Secondary | ICD-10-CM | POA: Diagnosis not present

## 2022-02-04 DIAGNOSIS — R1084 Generalized abdominal pain: Secondary | ICD-10-CM | POA: Insufficient documentation

## 2022-02-04 DIAGNOSIS — K760 Fatty (change of) liver, not elsewhere classified: Secondary | ICD-10-CM | POA: Diagnosis not present

## 2022-02-04 LAB — CBC WITH DIFFERENTIAL/PLATELET
Abs Immature Granulocytes: 0.03 10*3/uL (ref 0.00–0.07)
Basophils Absolute: 0.1 10*3/uL (ref 0.0–0.1)
Basophils Relative: 1 %
Eosinophils Absolute: 1.1 10*3/uL — ABNORMAL HIGH (ref 0.0–0.5)
Eosinophils Relative: 12 %
HCT: 48.9 % (ref 39.0–52.0)
Hemoglobin: 16.4 g/dL (ref 13.0–17.0)
Immature Granulocytes: 0 %
Lymphocytes Relative: 27 %
Lymphs Abs: 2.3 10*3/uL (ref 0.7–4.0)
MCH: 29.8 pg (ref 26.0–34.0)
MCHC: 33.5 g/dL (ref 30.0–36.0)
MCV: 88.9 fL (ref 80.0–100.0)
Monocytes Absolute: 0.8 10*3/uL (ref 0.1–1.0)
Monocytes Relative: 10 %
Neutro Abs: 4.5 10*3/uL (ref 1.7–7.7)
Neutrophils Relative %: 50 %
Platelets: 331 10*3/uL (ref 150–400)
RBC: 5.5 MIL/uL (ref 4.22–5.81)
RDW: 14.4 % (ref 11.5–15.5)
WBC: 8.8 10*3/uL (ref 4.0–10.5)
nRBC: 0 % (ref 0.0–0.2)

## 2022-02-04 LAB — COMPREHENSIVE METABOLIC PANEL
ALT: 65 U/L — ABNORMAL HIGH (ref 0–44)
AST: 55 U/L — ABNORMAL HIGH (ref 15–41)
Albumin: 4.1 g/dL (ref 3.5–5.0)
Alkaline Phosphatase: 68 U/L (ref 38–126)
Anion gap: 9 (ref 5–15)
BUN: 11 mg/dL (ref 6–20)
CO2: 26 mmol/L (ref 22–32)
Calcium: 9.2 mg/dL (ref 8.9–10.3)
Chloride: 105 mmol/L (ref 98–111)
Creatinine, Ser: 0.73 mg/dL (ref 0.61–1.24)
GFR, Estimated: >60 mL/min (ref >60)
Glucose, Bld: 101 mg/dL — ABNORMAL HIGH (ref 70–99)
Potassium: 3.9 mmol/L (ref 3.5–5.1)
Sodium: 140 mmol/L (ref 135–145)
Total Bilirubin: 0.7 mg/dL (ref 0.3–1.2)
Total Protein: 6.6 g/dL (ref 6.5–8.1)

## 2022-02-04 LAB — D-DIMER, QUANTITATIVE: D-Dimer, Quant: 0.43 ug/mL-FEU (ref 0.00–0.50)

## 2022-02-04 LAB — LIPASE, BLOOD: Lipase: 46 U/L (ref 11–51)

## 2022-02-04 LAB — BRAIN NATRIURETIC PEPTIDE: B Natriuretic Peptide: 68.7 pg/mL (ref 0.0–100.0)

## 2022-02-04 LAB — TROPONIN I (HIGH SENSITIVITY): Troponin I (High Sensitivity): 4 ng/L (ref <18)

## 2022-02-04 MED ORDER — SUCRALFATE 1 GM/10ML PO SUSP: 1.0000 g | Freq: Two times a day (BID) | ORAL | 0 refills | Status: DC

## 2022-02-04 MED ORDER — IOHEXOL 9 MG/ML PO SOLN: 500.0000 mL | ORAL | Status: DC

## 2022-02-04 MED ORDER — IOHEXOL 300 MG/ML  SOLN
100.0000 mL | Freq: Once | INTRAMUSCULAR | Status: AC | PRN
  Administered 2022-02-04: 100 mL via INTRAVENOUS

## 2022-02-04 MED ORDER — ONDANSETRON HCL 4 MG/2ML IJ SOLN
4.0000 mg | Freq: Once | INTRAMUSCULAR | Status: AC
  Administered 2022-02-04: 4 mg via INTRAVENOUS
  Filled 2022-02-04: qty 2

## 2022-02-04 MED ORDER — IOHEXOL 9 MG/ML PO SOLN: 500.0000 mL | ORAL | Status: AC

## 2022-02-04 MED ORDER — SODIUM CHLORIDE 0.9 % IV BOLUS
500.0000 mL | Freq: Once | INTRAVENOUS | Status: AC
  Administered 2022-02-04: 500 mL via INTRAVENOUS

## 2022-02-04 MED ORDER — HYDROMORPHONE HCL 1 MG/ML IJ SOLN
1.0000 mg | Freq: Once | INTRAMUSCULAR | Status: AC
  Administered 2022-02-04: 1 mg via INTRAVENOUS
  Filled 2022-02-04: qty 1

## 2022-02-04 MED ORDER — IOHEXOL 9 MG/ML PO SOLN
ORAL | Status: AC
  Administered 2022-02-04: 500 mL
  Filled 2022-02-04: qty 500

## 2022-02-04 MED ORDER — LACTATED RINGERS IV BOLUS
1000.0000 mL | Freq: Once | INTRAVENOUS | Status: AC
  Administered 2022-02-04: 1000 mL via INTRAVENOUS

## 2022-02-04 MED ORDER — FAMOTIDINE IN NACL 20-0.9 MG/50ML-% IV SOLN: 20.0000 mg | Freq: Once | INTRAVENOUS | Status: DC

## 2022-02-04 MED ORDER — SUCRALFATE 1 G PO TABS
1.0000 g | ORAL_TABLET | Freq: Once | ORAL | Status: AC
  Administered 2022-02-04: 1 g via ORAL
  Filled 2022-02-04: qty 1

## 2022-02-04 MED ORDER — PANTOPRAZOLE SODIUM 40 MG IV SOLR
40.0000 mg | Freq: Once | INTRAVENOUS | Status: AC
  Administered 2022-02-04: 40 mg via INTRAVENOUS
  Filled 2022-02-04: qty 10

## 2022-02-04 NOTE — ED Provider Triage Note (Signed)
Emergency Medicine Provider Triage Evaluation Note  Casey Caldwell , a 42 y.o. male  was evaluated in triage.  Pt complains of abdominal pain, chest pain, shortness of breath.  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 Redmond Pulling here.  States he was doing well after the surgery until Tuesday of this week when he developed severe middle abdominal pain.  He is also developed some chest pain and shortness of breath today.  He initially presented to [facility] emergency department but had a 5-hour wait time so he came here.  He did call his surgeon's office and has an appointment scheduled for 330 today but was told to present to the emergency department if symptoms got worse.  Has nausea but no vomiting.  Has not been able to eat or drink today.  Also reporting hematochezia.  Review of Systems  Positive: As above Negative: As above  Physical Exam  BP (!) 154/111 (BP Location: Left Arm)   Pulse 76   Temp 98 F (36.7 C) (Oral)   Resp 18   Ht 5' 11.5" (1.816 m)   Wt (!) 163.3 kg   SpO2 95%   BMI 49.51 kg/m  Gen:   Awake, moderate distress   Resp:  Normal effort  MSK:   Moves extremities without difficulty  Other:  Surgical scars on abdomen healing well.  Diffuse tenderness to palpation  Medical Decision Making  Medically screening exam initiated at 1:35 PM.  Appropriate orders placed.  Casey Caldwell was informed that the remainder of the evaluation will be completed by another provider, this initial triage assessment does not replace that evaluation, and the importance of remaining in the ED until their evaluation is complete.  Abdominal pain labs, chest pain labs, D-dimer due to recent surgery with chest pain and shortness of breath, CT abdomen   Roylene Reason, PA-C 02/04/22 1337

## 2022-02-04 NOTE — Discharge Instructions (Addendum)
Thank you for coming to [facility] Emergency Department. You were seen for abdominal pain. We did an exam, labs, and imaging, and these showed no acute findings.  He was seen by surgery from Dr. Redmond Pulling and he relayed that you may have irritation at your anastomosis.  He recommended crushing Protonix twice per day and a little water and taking liquid Carafate twice per day.  If the liquid Carafate is too expensive you can do crushed Carafate twice per day.  Please utilize Zofran 4 mg every 6-8 hours for nausea as well.  Please follow up with your surgeon as originally scheduled.  Do not hesitate to return to the ED or call 911 if you experience: -Worsening symptoms -Nausea vomiting abdominal pain so severe you cannot eat or drink anything -Lightheadedness, passing out -Fevers/chills -Anything else that concerns you

## 2022-02-04 NOTE — Consult Note (Signed)
Consult Note  Casey Caldwell [birth year]  [id number].    Requesting MD: Dr. Dina Rich Chief Complaint/Reason for Consult: abdominal pain postoperatively   HPI:  42 y.o. male with medical history significant for anxiety/depression, chronic pain/back pain on chronic pain medication, GERD, hypertension, kidney stones, seizures who presented to [facility] emergency department with chest pain, abdominal pain, diarrhea.  Patient is known to our service and underwent laparoscopic Roux-en-Y bypass by Dr. Redmond Pulling on 12/19, is postop day 24.  He states that he initially had an uneventful postoperative course with gradual improvement in pain and was tolerating a diet.  At baseline he oscillates between constipation and diarrhea.  On Friday he lifted a heavy bag of dog food and felt increased abdominal pain.  This progressed and on Sunday became associated with difficulty having a bowel movement.  States bowel movements were hard and involve a lot of straining.  He began taking stool softeners and MiraLAX Sunday night/Monday after which he was having frequent loose bowel movements with 4-5 episodes of bright red blood and clots mixed in with his stool.  He has a history of hemorrhoids but states this bleeding appears different.  He has been taking his baseline pain medication, 40 mg of Vicodin daily in addition to baseline as needed pain medication Dilaudid at home.  This has improved his pain some.  He has nausea at baseline but is more severe currently. He denies fevers at home. He has had some chills. Last BM was this am and last PO intake yogurt this am.  ROS: ROS reviewed and negative except as above  Family History  Problem Relation Age of Onset   Stroke Mother    Liver disease Mother    Irritable bowel syndrome Mother    Kidney disease Mother    Heart disease Father    Colon polyps Father    Colon cancer Neg Hx     Past Medical History:  Diagnosis Date   Anxiety    Chronic pain syndrome     Depression    and panic/anxiety   Diabetes mellitus without complication (HCC)    Dyspnea    GERD (gastroesophageal reflux disease)    History of kidney stones    Hypertension    Insomnia    Kidney stones    Seizures ([place]) Childhood    Past Surgical History:  Procedure Laterality Date   APPENDECTOMY  2011   CHOLECYSTECTOMY, LAPAROSCOPIC     CYSTOSCOPY W/ URETERAL STENT PLACEMENT Right 01/27/2020   Procedure: CYSTOSCOPY WITH RETROGRADE PYELOGRAM/URETERAL STENT PLACEMENT;  Surgeon: Ceasar Mons, MD;  Location: [facility];  Service: Urology;  Laterality: Right;   CYSTOSCOPY/URETEROSCOPY/HOLMIUM LASER Left 06/15/2017   Procedure: CYSTOSCOPY/LEFT URETEROSCOPYSTONE EXTRACTION/LEFT RETROGRADE;  Surgeon: Franchot Gallo, MD;  Location: [facility];  Service: Urology;  Laterality: Left;  With STENT   GASTRIC ROUX-EN-Y N/A 01/11/2022   Procedure: LAPAROSCOPIC ROUX-EN-Y GASTRIC BYPASS WITH UPPER ENDOSCOPY;  Surgeon: Greer Pickerel, MD;  Location: [facility];  Service: General;  Laterality: N/A;   HYDROCELE EXCISION Left 06/2012   LAPAROSCOPIC CHOLECYSTECTOMY  1996   LITHOTRIPSY     four times over the years; most recent 10/08/12   ORCHIECTOMY Left 11/2012   SPINAL CORD STIMULATOR INSERTION  2015   Subsequently removed and replaced.   SPINAL CORD STIMULATOR INSERTION  2023   [employer] 5.   TONSILLECTOMY  1992   URETEROSCOPY WITH HOLMIUM LASER LITHOTRIPSY Right 07/22/2020   Procedure: CYSTOSCOPY, RIGHT RETROGRADE PYLEOGRAM, RIGHT  URETEROSCOPY WITH HOLMIUM LASER LITHOTRIPSY, RIGHT URETERAL STENT;  Surgeon: Alexis Frock, MD;  Location: [facility];  Service: Urology;  Laterality: Right;   WRIST GANGLION EXCISION  2002   Right    Social History:  reports that he has never smoked. He has never used smokeless tobacco. He reports that he does not currently use alcohol. He reports that he does not use drugs.  Allergies:  Allergies  Allergen Reactions   Gabapentin     Intolerant, sedation     Oxycodone     "feels like I am going to crawl out of my skin"   Phenergan [Promethazine Hcl]     Unknown reaction   Tape Hives    Adhesive tape   Toradol [Ketorolac Tromethamine]     "feels like I am going to crawl out of my skin"   Trileptal [Oxcarbazepine] Other (See Comments)    Profound insomnia, worsening mood.    Wellbutrin [Bupropion]     H/o SZ d/o. Causes mood swings    (Not in a hospital admission)   Blood pressure (!) 145/82, pulse 62, temperature 98 F (36.7 C), temperature source Oral, resp. rate (!) 0, height 5' 11.5" (1.816 m), weight (!) 163.3 kg, SpO2 99 %. Physical Exam: General: pleasant, WD, male who is laying in bed in NAD HEENT: head is normocephalic, atraumatic.  Sclera are noninjected.  Pupils equal and round. EOMs intact.  Ears and nose without any masses or lesions.  Mouth is pink and moist Heart: regular, rate, and rhythm. Lungs: Respiratory effort nonlabored on room air Abd: soft, obese, small likely fat containing umbilical hernia which is soft and reducible. Abdomen is diffusely TTP with voluntary guarding. Tenderness is greatest periumbilically and over there LUQ Incisions intact and healing well without surrounding erythema or induration MSK: all 4 extremities are symmetrical with no cyanosis, clubbing, or edema. Skin: warm and dry with no masses, lesions, or rashes Neuro: Cranial nerves 2-12 grossly intact, sensation is normal throughout Psych: A&Ox3 with an appropriate affect.    Results for orders placed or performed during the hospital encounter of 02/04/22 (from the past 48 hour(s))  CBC with Differential     Status: Abnormal   Collection Time: 02/04/22  1:35 PM  Result Value Ref Range   WBC 8.8 4.0 - 10.5 K/uL   RBC 5.50 4.22 - 5.81 MIL/uL   Hemoglobin 16.4 13.0 - 17.0 g/dL   HCT 48.9 39.0 - 52.0 %   MCV 88.9 80.0 - 100.0 fL   MCH 29.8 26.0 - 34.0 pg   MCHC 33.5 30.0 - 36.0 g/dL   RDW 14.4 11.5 - 15.5 %   Platelets 331 150 - 400  K/uL   nRBC 0.0 0.0 - 0.2 %   Neutrophils Relative % 50 %   Neutro Abs 4.5 1.7 - 7.7 K/uL   Lymphocytes Relative 27 %   Lymphs Abs 2.3 0.7 - 4.0 K/uL   Monocytes Relative 10 %   Monocytes Absolute 0.8 0.1 - 1.0 K/uL   Eosinophils Relative 12 %   Eosinophils Absolute 1.1 (H) 0.0 - 0.5 K/uL   Basophils Relative 1 %   Basophils Absolute 0.1 0.0 - 0.1 K/uL   Immature Granulocytes 0 %   Abs Immature Granulocytes 0.03 0.00 - 0.07 K/uL    Comment: Performed at [facility], [place] [address] [place], [place] [address]  Comprehensive metabolic panel     Status: Abnormal   Collection Time: 02/04/22  1:35 PM  Result  Value Ref Range   Sodium 140 135 - 145 mmol/L   Potassium 3.9 3.5 - 5.1 mmol/L   Chloride 105 98 - 111 mmol/L   CO2 26 22 - 32 mmol/L   Glucose, Bld 101 (H) 70 - 99 mg/dL    Comment: Glucose reference range applies only to samples taken after fasting for at least 8 hours.   BUN 11 6 - 20 mg/dL   Creatinine, Ser 0.73 0.61 - 1.24 mg/dL   Calcium 9.2 8.9 - 10.3 mg/dL   Total Protein 6.6 6.5 - 8.1 g/dL   Albumin 4.1 3.5 - 5.0 g/dL   AST 55 (H) 15 - 41 U/L   ALT 65 (H) 0 - 44 U/L   Alkaline Phosphatase 68 38 - 126 U/L   Total Bilirubin 0.7 0.3 - 1.2 mg/dL   GFR, Estimated >60 >60 mL/min    Comment: (NOTE) Calculated using the CKD-EPI Creatinine Equation (2021)    Anion gap 9 5 - 15    Comment: Performed at [facility], [place] [address] [place], [place] [address]  Lipase, blood     Status: None   Collection Time: 02/04/22  1:35 PM  Result Value Ref Range   Lipase 46 11 - 51 U/L    Comment: Performed at [facility], [place] [address] [place], [place] [address]  D-dimer, quantitative     Status: None   Collection Time: 02/04/22  1:35 PM  Result Value Ref Range   D-Dimer, Quant 0.43 0.00 - 0.50 ug/mL-FEU    Comment: (NOTE) At the manufacturer cut-off value of 0.5 g/mL FEU, this assay has a negative predictive value of  95-100%.This assay is intended for use in conjunction with a clinical pretest probability (PTP) assessment model to exclude pulmonary embolism (PE) and deep venous thrombosis (DVT) in outpatients suspected of PE or DVT. Results should be correlated with clinical presentation. Performed at [facility], [place] [address] [place], [place] [address]   Troponin I (High Sensitivity)     Status: None   Collection Time: 02/04/22  1:35 PM  Result Value Ref Range   Troponin I (High Sensitivity) 4 <18 ng/L    Comment: (NOTE) Elevated high sensitivity troponin I (hsTnI) values and significant  changes across serial measurements may suggest ACS but many other  chronic and acute conditions are known to elevate hsTnI results.  Refer to the "Links" section for chest pain algorithms and additional  guidance. Performed at [facility], [place] [address], [place], [place] [address]   Brain natriuretic peptide     Status: None   Collection Time: 02/04/22  2:00 PM  Result Value Ref Range   B Natriuretic Peptide 68.7 0.0 - 100.0 pg/mL    Comment: Performed at [facility], [place] [address] [place], [place] [address]   DG Chest Portable 1 View  Result Date: 02/04/2022 CLINICAL DATA:  Chest and abdominal pain.  Recent Roux-en-Y surgery. EXAM: PORTABLE CHEST 1 VIEW COMPARISON:  11/12/2021 radiograph FINDINGS: Cardiomegaly noted. There is no evidence of focal airspace disease, pulmonary edema, suspicious pulmonary nodule/mass, pleural effusion, or pneumothorax. No acute bony abnormalities are identified. No gross evidence of pneumoperitoneum underlying the hemidiaphragms. IMPRESSION: Cardiomegaly without acute cardiopulmonary disease. If there is strong clinical suspicion for pneumoperitoneum, recommend CT. Electronically Signed   By: Margarette Canada M.D.   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 Redmond Pulling  POD  24 Patient seen and examined relevant labs and imaging reviewed.  WBC and electrolytes within normal limits.  Labs and vital signs reassuring for lower likelihood of underlying infectious process.  Agree with CT abdomen/pelvis with IV and p.o. contrast to evaluate further for intra-abdominal process or anastomotic issue.  Patient has pain on exam but no findings concerning for urgent/emergent surgical need currently. We will follow for imaging results   FEN: NPO while awaiting CT, IV bolus orderd ID: none currently VTE: okay for chemical ppx   Winferd Humphrey, [facility] Surgery 02/04/2022, 4:15 PM Please see Am[facility] for pager number during day hours 7:00am-4:30pm

## 2022-02-04 NOTE — ED Provider Notes (Signed)
4:50 PM Assumed care of patient from off-going team. For more details, please see note from same day.  In brief, this is a 42 y.o. male who had recent roux-en-Y p/w rectal bleeding, abdominal pain, emesis.   Plan/Dispo at time of sign-out & ED Course since sign-out: '[ ]'$  CT A/P If not surgical, surgeon Dr. Redmond Pulling recommended sucralfate and pepcid. Dr. Redmond Pulling saw him, signed out to Dr. Johney Maine.    BP (!) 145/82   Pulse 62   Temp 98 F (36.7 C) (Oral)   Resp (!) 0   Ht 5' 11.5" (1.816 m)   Wt (!) 163.3 kg   SpO2 99%   BMI 49.51 kg/m    ED Course:   Clinical Course as of 02/04/22 1931  Fri Feb 04, 2022  1729 D-Dimer, Quant: 0.43 [HN]  1729 Lipase: 46 [HN]  1729 B Natriuretic Peptide: 68.7 [HN]  1729 Comprehensive metabolic panel(!) Slightly elevated AST/ALT otherwise unremarkable, c/w priors [HN]  1729 Troponin I (High Sensitivity): 4 [HN]  1729 CBC with Differential(!) unremarakble [HN]  1729 CT Abdomen Pelvis W Contrast 1. No acute findings or explanation for the patient's symptoms. 2. Interval postsurgical changes consistent with ante-colic Roux-en-Y gastric bypass procedure. No evidence of bowel obstruction, inflammation or other complication. 3. Nonobstructing bilateral renal calculi. 4. Interval improvement in previously demonstrated hepatic steatosis.   [HN]  3500 CT reassuring.  Discussed with Dr. Redmond Pulling who recommends finishing his iv fluids. rec Crushed bid protonix mixed with a little water and liquid carafate q ac/hs. if liquid carafate too expensive, can do crushed carafate qach/hs [HN]  1926 D/w patient who had talked with Dr. Redmond Pulling and will take new meds at home. He states he already has protonix and just needs rx for carafate.  He states he feels marginally better but is ready to be discharged and is relieved that there is nothing urgent on the CT.  Will discharge patient with discharge instructions, return precautions, instructions to stay well-hydrated at home,  surgery follow-up as originally scheduled. [HN]    Clinical Course User Index [HN] Audley Hose, MD    Dispo: DC ------------------------------- Cindee Lame, MD Emergency Medicine  This note was created using dictation software, which may contain spelling or grammatical errors.   Audley Hose, MD 02/04/22 [phone]

## 2022-02-04 NOTE — ED Provider Notes (Signed)
[facility] DEPT Provider Note   CSN: [id number] Arrival date & time: 02/04/22  1314     History  Chief Complaint  Patient presents with   Post-op Problem   Rectal Bleeding   Chest Pain   Abdominal Pain   Emesis    MASAKI ROTHBAUER is a 42 y.o. male.  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 Redmond Pulling presents to the emergency department with diffuse abdominal pain.  Patient states he was doing well when he was discharged home but he developed constipation.  He took medications and while he achieved relief he noticed some small amount of bright red blood when wiping.  No other profuse rectal bleeding/passage of clots.  But he now has diffuse abdominal pain/cramping.  He is unable to tolerate anything oral as it causes the cramping to be worse.  He denies any fever or vomiting but endorses mild nausea.  No chest pain or difficulty breathing.  Home Medications Prior to Admission medications   Medication Sig Start Date End Date Taking? Authorizing Provider  allopurinol (ZYLOPRIM) 300 MG tablet Take 1 tablet (300 mg total) by mouth daily. 09/15/21   McKenzie, Candee Furbish, MD  ARIPiprazole (ABILIFY) 5 MG tablet Take 0.5 tablets (2.5 mg total) by mouth daily. 12/20/21   Tonia Ghent, MD  busPIRone (BUSPAR) 5 MG tablet Take 1 tablet (5 mg total) by mouth 2 (two) times daily. 12/20/21   Tonia Ghent, MD  Calcium Carb-Cholecalciferol (CALCIUM 500 + D PO) Take 1 tablet by mouth in the morning, at noon, and at bedtime.    [provider]  Cholecalciferol (DIALYVITE VITAMIN D 5000) 125 MCG (5000 UT) capsule Take 5,000 Units by mouth daily.    [provider]  citric acid-potassium citrate (POLYCITRA) 1100-334 MG/5ML solution Take 5 mLs (10 mEq total) by mouth 3 (three) times daily. 01/10/22   Tonia Ghent, MD  colchicine 0.6 MG tablet TAKE ONE TABLET BY MOUTH TWICE A DAY AS  NEEDED FOR GOUT FLARE 12/20/21   Tonia Ghent, MD  enoxaparin (LOVENOX) 60 MG/0.6ML injection Inject 0.6 mLs (60 mg total) into the skin every 12 (twelve) hours for 14 days. 01/12/22 01/26/22  Greer Pickerel, MD  escitalopram (LEXAPRO) 20 MG tablet Take 1 tablet (20 mg total) by mouth daily. 12/20/21   Tonia Ghent, MD  HYDROcodone-acetaminophen (NORCO/VICODIN) 5-325 MG tablet Take 1-2 tablets by mouth every 6 (six) hours as needed for severe pain (8 tabs or less per day). 01/13/22   Tonia Ghent, MD  HYDROmorphone (DILAUDID) 2 MG tablet Take 1 tablet (2 mg total) by mouth every 4 (four) hours as needed for severe pain. 01/25/22   Tonia Ghent, MD  indapamide (LOZOL) 2.5 MG tablet Take 1 tablet (2.5 mg total) by mouth daily. 12/20/21   Tonia Ghent, MD  lisinopril (ZESTRIL) 10 MG tablet Take 1 tablet (10 mg total) by mouth daily. 12/20/21   Tonia Ghent, MD  loratadine (CLARITIN) 10 MG tablet Take 1 tablet (10 mg total) by mouth daily as needed for allergies. 12/20/21   Tonia Ghent, MD  Multiple Vitamins-Minerals (BARIATRIC MULTIVITAMINS/IRON) CAPS Take 1 capsule by mouth 2 (two) times daily.    [provider]  ondansetron (ZOFRAN-ODT) 4 MG disintegrating tablet Take 1 tablet (4 mg total) by mouth every 6 (six) hours as needed for nausea or vomiting. 01/12/22   Redmond Pulling,  Randall Hiss, MD  pantoprazole (PROTONIX) 40 MG tablet Take 1 tablet (40 mg total) by mouth 2 (two) times daily. 12/20/21   Tonia Ghent, MD  polyethylene glycol powder (GLYCOLAX/MIRALAX) 17 GM/SCOOP powder MIX 17G IN 4 TO 8 OUNCES OF FLUID AND TAKE TWICE DAILY IF NEEDED 12/28/21   Tonia Ghent, MD  tamsulosin (FLOMAX) 0.4 MG CAPS capsule Take 1 capsule (0.4 mg total) by mouth daily. 01/09/20   Molpus, John, MD  triamcinolone cream (KENALOG) 0.5 % Apply 1 application topically 2 (two) times daily. 01/13/21   Tonia Ghent, MD  zolpidem (AMBIEN) 10 MG tablet TAKE ONE TABLET BY MOUTH EVERY NIGHT AT  BEDTIME AS NEEDED FOR SLEEP 12/14/21   Tonia Ghent, MD      Allergies    Gabapentin, Oxycodone, Phenergan [promethazine hcl], Tape, Toradol [ketorolac tromethamine], Trileptal [oxcarbazepine], and Wellbutrin [bupropion]    Review of Systems   Review of Systems  Constitutional:  Positive for appetite change. Negative for fever.  Respiratory:  Negative for shortness of breath.   Cardiovascular:  Negative for chest pain and leg swelling.  Gastrointestinal:  Positive for abdominal pain, blood in stool and nausea. Negative for diarrhea and vomiting.  Musculoskeletal:  Negative for back pain.  Skin:  Negative for rash.  Neurological:  Negative for headaches.    Physical Exam Updated Vital Signs BP (!) 154/111 (BP Location: Left Arm)   Pulse 76   Temp 98 F (36.7 C) (Oral)   Resp 18   Ht 5' 11.5" (1.816 m)   Wt (!) 163.3 kg   SpO2 95%   BMI 49.51 kg/m  Physical Exam Vitals and nursing note reviewed.  Constitutional:      Appearance: Normal appearance. He is obese.  HENT:     Head: Normocephalic.     Mouth/Throat:     Mouth: Mucous membranes are moist.  Cardiovascular:     Rate and Rhythm: Normal rate.  Pulmonary:     Effort: Pulmonary effort is normal. No respiratory distress.  Abdominal:     Tenderness: There is no abdominal tenderness.     Comments: Obese, bowel sounds present, diffusely tender to lightest palpation, no significant distention, incision sites appear well, no obvious hernia palpated, no overlying skin color changes  Skin:    General: Skin is warm.  Neurological:     Mental Status: He is alert and oriented to person, place, and time. Mental status is at baseline.  Psychiatric:        Mood and Affect: Mood normal.     ED Results / Procedures / Treatments   Labs (all labs ordered are listed, but only abnormal results are displayed) Labs Reviewed  CBC WITH DIFFERENTIAL/PLATELET - Abnormal; Notable for the following components:      Result Value    Eosinophils Absolute 1.1 (*)    All other components within normal limits  COMPREHENSIVE METABOLIC PANEL - Abnormal; Notable for the following components:   Glucose, Bld 101 (*)    AST 55 (*)    ALT 65 (*)    All other components within normal limits  LIPASE, BLOOD  D-DIMER, QUANTITATIVE  URINALYSIS, ROUTINE W REFLEX MICROSCOPIC  BRAIN NATRIURETIC PEPTIDE  TROPONIN I (HIGH SENSITIVITY)  TROPONIN I (HIGH SENSITIVITY)    EKG EKG Interpretation  Date/Time:  Friday February 04 2022 14:36:40 EST Ventricular Rate:  59 PR Interval:  161 QRS Duration: 101 QT Interval:  410 QTC Calculation: 407 R Axis:   -3 Text Interpretation: Sinus  rhythm Low voltage, precordial leads Confirmed by Lavenia Atlas [phone]) on 02/04/2022 3:20:18 PM  Radiology No results found.  Procedures Procedures    Medications Ordered in ED Medications  iohexol (OMNIPAQUE) 9 MG/ML oral solution 500 mL (has no administration in time range)  HYDROmorphone (DILAUDID) injection 1 mg (1 mg Intravenous Given 02/04/22 1446)  ondansetron (ZOFRAN) injection 4 mg (4 mg Intravenous Given 02/04/22 1445)  sodium chloride 0.9 % bolus 500 mL (500 mLs Intravenous New Bag/Given 02/04/22 1446)  iohexol (OMNIPAQUE) 9 MG/ML oral solution (500 mLs  Contrast Given 02/04/22 1449)    ED Course/ Medical Decision Making/ A&P                             Medical Decision Making Amount and/or Complexity of Data Reviewed Radiology: ordered.  Risk Prescription drug management.   42 year old male who is status post Roux-en-Y with Dr. Redmond Pulling presents emergency department with abdominal pain.  Vital signs are stable on arrival, he has diffuse abdominal tenderness to palpation, no distention.  Portable chest x-ray shows no obvious free air underneath the diaphragm.  Blood work is reassuring without leukocytosis or anemia.  Plan for CT study with oral contrast.  Dr. Redmond Pulling has evaluated the patient.  Pending CT results if there is no acute  finding we will plan for home symptomatic treatment.  He mentioned including Pepcid 40 mg daily and sucralfate as an outpatient.  Patient signed out pending CT results and surgical consult which will be with Dr. Johney Maine at the time of results.        Final Clinical Impression(s) / ED Diagnoses Final diagnoses:  None    Rx / DC Orders ED Discharge Orders     None         Lorelle Gibbs, DO 02/04/22 1628

## 2022-02-04 NOTE — ED Triage Notes (Signed)
Patient reports that he had a Gastric roux-en-y approx 2 weeks ago. Patient c/o nausea, abdominal pain x 4 days. Patient states the pain at his umbilicus is worse.  Patient also reports seeing bright red blood mixxed in his stool x 2 days. Patient states he feels like he has a meat grinder in his abdomen.  Today the patient also c/o chest pain and SOB .

## 2022-02-08 ENCOUNTER — Ambulatory Visit: Payer: Medicaid Other | Admitting: Family Medicine

## 2022-02-08 ENCOUNTER — Encounter: Payer: Self-pay | Admitting: Family Medicine

## 2022-02-08 VITALS — BP 110/80 | HR 84 | Temp 97.2°F | Ht 71.5 in | Wt 356.0 lb

## 2022-02-08 DIAGNOSIS — I10 Essential (primary) hypertension: Secondary | ICD-10-CM | POA: Diagnosis not present

## 2022-02-08 DIAGNOSIS — E669 Obesity, unspecified: Secondary | ICD-10-CM

## 2022-02-08 DIAGNOSIS — E119 Type 2 diabetes mellitus without complications: Secondary | ICD-10-CM | POA: Diagnosis not present

## 2022-02-08 DIAGNOSIS — M549 Dorsalgia, unspecified: Secondary | ICD-10-CM | POA: Diagnosis not present

## 2022-02-08 DIAGNOSIS — G8929 Other chronic pain: Secondary | ICD-10-CM | POA: Diagnosis not present

## 2022-02-08 MED ORDER — ZOLPIDEM TARTRATE 10 MG PO TABS: ORAL_TABLET | ORAL | 0 refills | Status: DC

## 2022-02-08 MED ORDER — PANTOPRAZOLE SODIUM 40 MG PO TBEC: 40.0000 mg | DELAYED_RELEASE_TABLET | Freq: Two times a day (BID) | ORAL | 1 refills | Status: DC

## 2022-02-08 MED ORDER — HYDROCODONE-ACETAMINOPHEN 5-325 MG PO TABS: 1.0000 | ORAL_TABLET | Freq: Four times a day (QID) | ORAL | 0 refills | Status: DC | PRN

## 2022-02-08 MED ORDER — HYDROMORPHONE HCL 2 MG PO TABS: 2.0000 mg | ORAL_TABLET | ORAL | 0 refills | Status: DC | PRN

## 2022-02-08 NOTE — Patient Instructions (Addendum)
Add on sucralfate. Update me as needed.  I'll await the surgery notes from the pending follow up appointment.  Recheck after March 15th with labs at the visit.  Take care.  Glad to see you.

## 2022-02-08 NOTE — Progress Notes (Signed)
Surgery clinic follow up pending.    ER eval d/w pt. per report, Dr. Redmond Pulling recommended sucralfate and pepcid. He is going to get sucralfate today.  It got held up at pharmacy with coverage issues.    Abd pain is better in the meantime.  Abd wall is still sore.  He prev had severe abd pain, used hydromorphone in the meantime.  Prev with constipation.  He cut back to liquids and soft foods, appropriate portion size.   He feels better in the meantime, with weight coming down. Mood affected when pain is worse. He is off lisinopril in the meantime.  Discussed.  Still on chronic opioids for back pain at baseline w/o sedation.  Still with sig pain.  He tried to mop his bathroom last night but had sig pain after a few minutes.  Pain is L lower back, lateral to his scar.  Constant.  Pain with sitting, laying, standing.  The plan is to get his weight down and then see back clinic in [place] when BMI allows.  Episodic R sided back pain/pressure.  Still with L groin pain.    Meds, vitals, and allergies reviewed.   ROS: Per HPI unless specifically indicated in ROS section   Nad but uncomfortable due to back pain. Neck supple.  No lymphadenopathy. Rrr Ctab Abd soft L lower back hypersensitive.  Previous surgical scars noted.  No rash on the lower back. Minimal bruising R lower abd pain from prev lovenox shots.   Healing incision sites noted on abdomen.  No spreading erythema. Extremities well-perfused.  30 minutes were devoted to patient care in this encounter (this includes time spent reviewing the patient's file/history, interviewing and examining the patient, counseling/reviewing plan with patient).

## 2022-02-09 NOTE — Assessment & Plan Note (Signed)
With abdominal pain improving.  See above regarding diet.  He will add on cervical fate and then update me as needed.  Okay for outpatient follow-up.

## 2022-02-09 NOTE — Assessment & Plan Note (Signed)
Expecting improvement in A1c on next recheck with weight loss.  See after visit summary.

## 2022-02-09 NOTE — Assessment & Plan Note (Signed)
The goal is for him to lose weight with gastric bypass surgery to where he can have his follow-up back evaluation surgery done.  Continue hydrocodone and hydromorphone as needed for breakthrough in the meantime.  At this point still okay for outpatient follow-up.  Discussed staying on a bowel regimen.

## 2022-02-09 NOTE — Assessment & Plan Note (Signed)
Improved as his weight came down.  Off lisinopril currently.

## 2022-02-11 ENCOUNTER — Telehealth: Payer: Self-pay | Admitting: Family Medicine

## 2022-02-11 NOTE — Telephone Encounter (Signed)
Pt called stating he recently changed insurance companies to [facility] & they are requiring a prior authorization from [employer] fr the meds, zolpidem (AMBIEN) 10 MG tablet & HYDROcodone-acetaminophen (NORCO/VICODIN) 5-325 MG tablet. Call back # [phone]

## 2022-02-15 ENCOUNTER — Encounter: Payer: Self-pay | Admitting: Family Medicine

## 2022-02-15 NOTE — Telephone Encounter (Signed)
Patient is getting ready to run out of medication. Please start requested PAs

## 2022-02-16 ENCOUNTER — Other Ambulatory Visit (HOSPITAL_COMMUNITY): Payer: Self-pay

## 2022-02-16 NOTE — Telephone Encounter (Signed)
Helene Kelp from [facility] called over and stated they are needing additional information in regards to the prior auth. They are needing clinical questions answered and diagnosis codes. She stated that the [facility] Ref #: [id number] and the Ambien Ref #: [id number] to give those when calling in. She stated that you can speak with anyone just give them a call at [phone] and there is a 24/hr turnaround time. Thank you!

## 2022-02-16 NOTE — Telephone Encounter (Signed)
Called and spoke with [employer] med[employer] and PA's for patients medications. Was able to get Ambien approved from now til 08/15/2022. They will only supply #30 for 30 day supply. Called and left a message for patient that Lorrin Mais was approved and hydro rx is still being worked on. I did answer clinical questions for the hydro and is under review; they did ask for clinical notes and I have faxed those in. Waiting on determination.

## 2022-02-17 ENCOUNTER — Other Ambulatory Visit: Payer: Self-pay | Admitting: Family Medicine

## 2022-02-17 DIAGNOSIS — G8929 Other chronic pain: Secondary | ICD-10-CM

## 2022-02-17 MED ORDER — HYDROCODONE-ACETAMINOPHEN 5-325 MG PO TABS: 1.0000 | ORAL_TABLET | Freq: Four times a day (QID) | ORAL | 0 refills | Status: DC | PRN

## 2022-02-17 NOTE — Telephone Encounter (Signed)
Late documentation from yesterday. I had a hard time getting the fax to go thru to send the PA department OV notes. A couple hrs later I was able to find another number to send to and did finally go thru. Not lonng after that I checked my [address] and there was already a denial in there for the hydrocodone rx before they even received the notes. I have notified the patient of this this morning and offered using a good rx card to get this until we can get this resolved. Waiting on patient to answer mychart message.

## 2022-02-17 NOTE — Telephone Encounter (Signed)
Called resubmitted new authorization. Clinical information faxed via epic as requested by insurance. Provided with new reference number listed below. Per insurance will have determination in 24 hours but can call phone number listed below to follow up this afternoon. Verified fax number with representative.  Reference number [id number] Phone number # [phone]

## 2022-02-17 NOTE — Telephone Encounter (Signed)
Patient messaged back and is okay with using good rx card and wants Rx sent to [facility]. Message has been sent to Dr. Damita Dunnings.

## 2022-02-21 NOTE — Telephone Encounter (Signed)
Called verified with Insurance that medication is denied due to medical necessity. They will fax form over now.

## 2022-02-23 NOTE — Telephone Encounter (Signed)
The status on this is that it was denied. They faxed over the denial letter and attached a form if you would like to request an appeal. New documents will need to be submitted in order to appeal this. I already sent over his last 3 OV notes with the PA so not sure what else can be done.

## 2022-02-23 NOTE — Telephone Encounter (Signed)
Noted. Thanks.  Please talk to me about this tomorrow.

## 2022-02-23 NOTE — Telephone Encounter (Signed)
What is the status on this?  Do I need to work on the form?  Thanks.

## 2022-03-04 ENCOUNTER — Telehealth (INDEPENDENT_AMBULATORY_CARE_PROVIDER_SITE_OTHER): Payer: Medicaid Other | Admitting: Family Medicine

## 2022-03-04 ENCOUNTER — Encounter: Payer: Self-pay | Admitting: Family Medicine

## 2022-03-04 VITALS — Ht 71.5 in

## 2022-03-04 DIAGNOSIS — U071 COVID-19: Secondary | ICD-10-CM

## 2022-03-04 MED ORDER — NIRMATRELVIR/RITONAVIR (PAXLOVID)TABLET: 3.0000 | ORAL_TABLET | Freq: Two times a day (BID) | ORAL | 0 refills | Status: AC

## 2022-03-04 MED ORDER — GUAIFENESIN-CODEINE 100-10 MG/5ML PO SYRP: 5.0000 mL | ORAL_SOLUTION | Freq: Every evening | ORAL | 0 refills | Status: DC | PRN

## 2022-03-04 NOTE — Progress Notes (Signed)
VIRTUAL VISIT A virtual visit is felt to be most appropriate for this patient at this time.   I connected with the patient on 03/24/22 at  3:40 PM EST by virtual telehealth platform and verified that I am speaking with the correct person using two identifiers.   I discussed the limitations, risks, security and privacy concerns of performing an evaluation and management service by  virtual telehealth platform and the availability of in person appointments. I also discussed with the patient that there may be a patient responsible charge related to this service. The patient expressed understanding and agreed to proceed.  Patient location: Home Provider Location: [facility] Participants: Eliezer Lofts and [name]   Chief Complaint  Patient presents with   Covid Positive    2 positive home test today   Cough    Symptoms started Wednesday   Sore Throat   Headache   Diarrhea        Nasal Congestion    History of Present Illness:  42 y.o. male patient of Tonia Ghent, MD with history of diabetes, hypertension and seizure disorder presents with COVID infection  Date of onset: Day 3 Initial symptoms included  scratchy throat, fatigue Symptoms progressed to body aches, decreased appetite, decreased taste in mouth. Minimal cough, cough keeping him up at night.  Nasal congestion, ST. Mild SOB , no wheeze. No fever.   Sick contacts:  none COVID testing:   Positive home test today x 2     He has tried to treat with  Nyquil, Dayquil     No history of chronic lung disease such as asthma or COPD. Non-smoker.   Recent bariatric surgery  12/2021.. still taking hydrocodone prn. Limiting PDMP reviewed during this encounter.      COVID 19 screen No recent travel or known exposure to COVID19 The patient denies respiratory symptoms of COVID 19 at this time.  The importance of social distancing was discussed today.   Review of Systems  All other systems reviewed and  are negative.     Past Medical History:  Diagnosis Date   Anxiety    Chronic pain syndrome    Depression    and panic/anxiety   Diabetes mellitus without complication (HCC)    Dyspnea    GERD (gastroesophageal reflux disease)    History of kidney stones    Hypertension    Insomnia    Kidney stones    Seizures ([place]) Childhood    reports that he has never smoked. He has never used smokeless tobacco. He reports that he does not currently use alcohol. He reports that he does not use drugs.   Current Outpatient Medications:    ARIPiprazole (ABILIFY) 5 MG tablet, Take 0.5 tablets (2.5 mg total) by mouth daily., Disp: 45 tablet, Rfl: 1   busPIRone (BUSPAR) 5 MG tablet, Take 1 tablet (5 mg total) by mouth 2 (two) times daily., Disp: 180 tablet, Rfl: 1   Calcium Carb-Cholecalciferol (CALCIUM 500 + D PO), Take 1 tablet by mouth in the morning, at noon, and at bedtime., Disp: , Rfl:    Cholecalciferol (DIALYVITE VITAMIN D 5000) 125 MCG (5000 UT) capsule, Take 5,000 Units by mouth daily., Disp: , Rfl:    citric acid-potassium citrate (POLYCITRA) 1100-334 MG/5ML solution, Take 5 mLs (10 mEq total) by mouth 3 (three) times daily., Disp: 473 mL, Rfl: 1   colchicine 0.6 MG tablet, TAKE ONE TABLET BY MOUTH TWICE A DAY AS NEEDED FOR GOUT FLARE,  Disp: 30 tablet, Rfl: 1   escitalopram (LEXAPRO) 20 MG tablet, Take 1 tablet (20 mg total) by mouth daily., Disp: 90 tablet, Rfl: 1   loratadine (CLARITIN) 10 MG tablet, Take 1 tablet (10 mg total) by mouth daily as needed for allergies., Disp: 30 tablet, Rfl: 2   Multiple Vitamins-Minerals (BARIATRIC MULTIVITAMINS/IRON) CAPS, Take 1 capsule by mouth 2 (two) times daily., Disp: , Rfl:    ondansetron (ZOFRAN-ODT) 4 MG disintegrating tablet, Take 1 tablet (4 mg total) by mouth every 6 (six) hours as needed for nausea or vomiting., Disp: 20 tablet, Rfl: 0   pantoprazole (PROTONIX) 40 MG tablet, Take 1 tablet (40 mg total) by mouth 2 (two) times daily., Disp: 180  tablet, Rfl: 1   polyethylene glycol powder (GLYCOLAX/MIRALAX) 17 GM/SCOOP powder, MIX 17G IN 4 TO 8 OUNCES OF FLUID AND TAKE TWICE DAILY IF NEEDED, Disp: 527 g, Rfl: 1   sucralfate (CARAFATE) 1 GM/10ML suspension, Take 10 mLs (1 g total) by mouth 2 (two) times daily., Disp: 420 mL, Rfl: 0   triamcinolone cream (KENALOG) 0.5 %, Apply 1 application topically 2 (two) times daily., Disp: 30 g, Rfl: 2   zolpidem (AMBIEN) 10 MG tablet, TAKE ONE TABLET BY MOUTH EVERY NIGHT AT BEDTIME AS NEEDED FOR SLEEP, Disp: 90 tablet, Rfl: 0   allopurinol (ZYLOPRIM) 300 MG tablet, Take 1 tablet (300 mg total) by mouth daily., Disp: 90 tablet, Rfl: 3   HYDROcodone-acetaminophen (NORCO/VICODIN) 5-325 MG tablet, Take 1-2 tablets by mouth every 6 (six) hours as needed for severe pain (8 tabs or less per day)., Disp: 240 tablet, Rfl: 0   HYDROmorphone (DILAUDID) 2 MG tablet, Take 1 tablet (2 mg total) by mouth every 4 (four) hours as needed for severe pain., Disp: 30 tablet, Rfl: 0   indapamide (LOZOL) 2.5 MG tablet, Take 1 tablet (2.5 mg total) by mouth daily., Disp: 90 tablet, Rfl: 3   Potassium Citrate 15 MEQ (1620 MG) TBCR, Take 1 tablet by mouth 2 (two) times daily., Disp: 60 tablet, Rfl: 11   Semaglutide-Weight Management (WEGOVY) 0.25 MG/0.5ML SOAJ, Inject 0.25 mg into the skin once a week., Disp: 2 mL, Rfl: 2   tamsulosin (FLOMAX) 0.4 MG CAPS capsule, Take 1 capsule (0.4 mg total) by mouth daily after supper., Disp: 30 capsule, Rfl: 11   Observations/Objective: Height 5' 11.5" (1.816 m).  Physical Exam Constitutional:      General: The patient is not in acute distress. Pulmonary:     Effort: Pulmonary effort is normal. No respiratory distress.  Neurological:     Mental Status: The patient is alert and oriented to person, place, and time.  Psychiatric:        Mood and Affect: Mood normal.        Behavior: Behavior normal.    Assessment and Plan COVID Assessment & Plan: COVID19  Infection < 5 days from  onset of symptoms in  vaccinated overweight individual with history of obesity, diabetes, hypertension and seizure disorder  No clear sign of bacterial infection at this time.   No SOB.  No red flags/need for ER visit or in-person exam at respiratory clinic at this time..    Pt higher risk for COVID complications given obesity, diabetes, hypertension and seizure disorder. GFR greater than 60 and no medication contraindications.  Start paxlovid 5 day course. Reviewed course of medication and side effect profile with patient in detail.   Symptomatic care with mucinex and cough suppressant at night. If SOB begins symptoms  worsening.. have low threshold for in-person exam, if severe shortness of breath ER visit recommended.  Can monitor Oxygen saturation at home with home monitor if able to obtain.  Go to ER if O2 sat < 90% on room air.   Reviewed home care and provided information through [place].  Recommended quarantine 5 days isolation recommended. Return to work day 6 and wear mask for 4 more days to complete 10 days. Provided info about prevention of spread of COVID 19.    Other orders -     nirmatrelvir/ritonavir; Take 3 tablets by mouth 2 (two) times daily for 5 days. (Take nirmatrelvir 150 mg two tablets twice daily for 5 days and ritonavir 100 mg one tablet twice daily for 5 days) Patient GFR is >60  Dispense: 30 tablet; Refill: 0      I discussed the assessment and treatment plan with the patient. The patient was provided an opportunity to ask questions and all were answered. The patient agreed with the plan and demonstrated an understanding of the instructions.   The patient was advised to call back or seek an in-person evaluation if the symptoms worsen or if the condition fails to improve as anticipated.     Eliezer Lofts, MD

## 2022-03-04 NOTE — Patient Instructions (Signed)
Hold colchicine.

## 2022-03-08 ENCOUNTER — Encounter: Payer: Medicaid Other | Attending: General Surgery | Admitting: Dietician

## 2022-03-08 ENCOUNTER — Encounter: Payer: Self-pay | Admitting: Dietician

## 2022-03-08 ENCOUNTER — Encounter: Payer: Self-pay | Admitting: Family Medicine

## 2022-03-08 ENCOUNTER — Telehealth (INDEPENDENT_AMBULATORY_CARE_PROVIDER_SITE_OTHER): Payer: Medicaid Other | Admitting: Family Medicine

## 2022-03-08 VITALS — Ht 71.5 in

## 2022-03-08 DIAGNOSIS — E669 Obesity, unspecified: Secondary | ICD-10-CM | POA: Insufficient documentation

## 2022-03-08 DIAGNOSIS — U071 COVID-19: Secondary | ICD-10-CM

## 2022-03-08 DIAGNOSIS — G8929 Other chronic pain: Secondary | ICD-10-CM

## 2022-03-08 DIAGNOSIS — M549 Dorsalgia, unspecified: Secondary | ICD-10-CM

## 2022-03-08 MED ORDER — HYDROMORPHONE HCL 2 MG PO TABS: 2.0000 mg | ORAL_TABLET | ORAL | 0 refills | Status: DC | PRN

## 2022-03-08 NOTE — Progress Notes (Signed)
Virtual visit completed through WebEx or similar program Patient location: home  Provider location: [place] at [facility], office  Participants: Patient and me (unless stated otherwise below)  Limitations and rationale for visit method d/w patient.  Patient agreed to proceed.   CC: follow up  HPI: recently tx'd for covid.  Fatigued.  Still on paxlovid.  Holding colchicine in the meantime.    Still living in [place].    Discussed weight loss from prior surgery and consideration of ozempic or similar, to curb his appetite.  Prev A1c 6.7 on 11/19/21.  Semaglutide cautions dw pt. His pharmacy is [facility] in [place].    I told him I will check with staff here about his hydrocodone prior authorization. Hydromorphone rx sent for chronic pain/breakthrough.  Not sedated.  Having BMs.  He has B renal stones.  He is going to follow-up with the spine clinic when possible.  Mood affected by recent covid infection, as expected but has generally been positive recently.    Meds and allergies reviewed.   ROS: Per HPI unless specifically indicated in ROS section   NAD Speech wnl  A/P:  Obesity status post gastric bypass and discussed using semaglutide as an appetite suppressant to aid in weight loss.  Prescription sent.  I will check with staff about prior authorization, if needed.  Routine Ozempic cautions given to patient.  History of diabetes noted.  He is improving from COVID.  He knows to hold colchicine in the meantime.  Chronic pain with history of kidney stones and chronic back pain.  Prescription sent for hydromorphone.  I will check on the prior authorization for his hydrocodone.  Not sedated.  Okay for outpatient follow-up.  See above.

## 2022-03-08 NOTE — Progress Notes (Signed)
Bariatric Nutrition Follow-Up Visit Medical Nutrition Therapy  Appt Start Time: 2:00   End Time: 2:29  Virtual Visit via Video Note  I connected with Casey Caldwell on 03/08/22 at  2:00 PM EST by a video enabled application and verified that I am speaking with the correct person using two identifiers. I discussed the limitations of this type of visit. The patient expressed understanding and agreed to proceed.  Surgery date: 01/11/2022 Surgery type: RYGB  Anthropometrics  Start weight at [facility]: 389.4 lbs (date: 11/23/2021)  Height: 71.5 in Weight today: no weight; virtual (pt self reports approx. 350 lbs.) BMI: 49.81 kg/m2     Clinical  Medical hx: Spinal stenosis with chronic pain syndrome, hypertension, GERD, hepatic steatosis, history of kidney stones, gout Medications: Hydrocodone, hydromorphone, Zofran, pot citrate, xyloprim, ambion, abilify, buspirone, Vit D  Labs: A1C 6.7; Vit D Notable signs/symptoms: none noted Any previous deficiencies? No Bowel Habits: Every day to every other day no complaints   Body Composition Scale 01/25/2022  Current Body Weight 362.2  Total Body Fat % 40.2  Visceral Fat 36  Fat-Free Mass % 59.7   Total Body Water % 40.7  Muscle-Mass lbs 68.4  BMI 49.3  Body Fat Displacement          Torso  lbs 90.6         Left Leg  lbs 18.1         Right Leg  lbs 18.1         Left Arm  lbs 9.0         Right Arm   lbs 9.0     Lifestyle & Dietary Hx  Pt states he is mostly doing protein shakes and yogurt and edamame before getting COVID Pt states he is prescribed Sucralfate, oral suspension for pain when eating.  Pt states he still has pain eating since surgery. Pt states the sucralfate helps.  Estimated daily fluid intake: 60-80 oz Estimated daily protein intake: 60-80 g Supplements: multivitamin and calcium Current average weekly physical activity: mall walking in the morning, 15-20 minutes, 2-3 times a week.   24-Hr Dietary Recall First Meal:  Protein shake Snack:   Second Meal: yogurt, edamame, protein shake Snack:  mixed tree nuts Third Meal: Protein shake Snack:  Beverages: water with flavoring, water  Post-Op Goals/ Signs/ Symptoms Using straws: no Drinking while eating: no Chewing/swallowing difficulties: no Changes in vision: no Changes to mood/headaches: no Hair loss/changes to skin/nails: no Difficulty focusing/concentrating: no Sweating: no Limb weakness: no Dizziness/lightheadedness: no Palpitations: no  Carbonated/caffeinated beverages: no N/V/D/C/Gas: no Abdominal pain: yes, after eating Dumping syndrome: no    NUTRITION DIAGNOSIS  Overweight/obesity ([place]-3.3) related to past poor dietary habits and physical inactivity as evidenced by completed bariatric surgery and following dietary guidelines for continued weight loss and healthy nutrition status.     NUTRITION INTERVENTION Nutrition counseling (C-1) and education (E-2) to facilitate bariatric surgery goals, including: Diet advancement to the next phase now including all vegetables and fruit as tolerated The importance of consuming adequate calories as well as certain nutrients daily due to the body's need for essential vitamins, minerals, and fats The importance of daily physical activity and to reach a goal of at least 150 minutes of moderate to vigorous physical activity weekly (or as directed by their physician) due to benefits such as increased musculature and improved lab values Encouraged patient to honor their body's internal hunger and fullness cues.  Throughout the day, check in mentally and rate  hunger. Stop eating when satisfied not full regardless of how much food is left on the plate.  Get more if still hungry 20-30 minutes later.  The key is to honor satisfaction so throughout the meal, rate fullness factor and stop when comfortably satisfied not physically full. The key is to honor hunger and fullness without any feelings of guilt or shame.   Pay attention to what the internal cues are, rather than any external factors. This will enhance the confidence you have in listening to your own body and following those internal cues enabling you to increase how often you eat when you are hungry not out of appetite and stop when you are satisfied not full.  Encouraged pt to continue to eat balanced meals inclusive of non starchy vegetables 2 times a day 7 days a week Encouraged pt to continue to drink a minium 64 fluid ounces with half being plain water to satisfy proper hydration   The importance of intuitive eating specifically learning hunger-satiety cues and understanding the importance of learning a new body: The importance of mindful eating to avoid grazing behaviors   Goals -introduce vegetables and fruits as tolerated.  Handouts Provided Include  Vegetables and Fruit Lists Bariatric MyPlate  Learning Style & Readiness for Change Teaching method utilized: Visual & Auditory  Demonstrated degree of understanding via: Teach Back  Readiness Level: Preparation Barriers to learning/adherence to lifestyle change: pain after eating since surgery  RD's Notes for Next Visit Assess adherence to pt chosen goals Tolerance to protein, vegetables and fruit   MONITORING & EVALUATION Dietary intake, weekly physical activity, body weight.  Next Steps Patient is to follow-up in 1 months for post-op follow-up.

## 2022-03-09 MED ORDER — WEGOVY 0.25 MG/0.5ML ~~LOC~~ SOAJ: 0.2500 mg | SUBCUTANEOUS | 2 refills | Status: DC

## 2022-03-09 NOTE — Assessment & Plan Note (Signed)
  Obesity status post gastric bypass and discussed using semaglutide as an appetite suppressant to aid in weight loss.  Prescription sent.  I will check with staff about prior authorization, if needed.  Routine Ozempic cautions given to patient.  History of diabetes noted.

## 2022-03-09 NOTE — Assessment & Plan Note (Signed)
  Chronic pain with history of kidney stones and chronic back pain.  Prescription sent for hydromorphone.  I will check on the prior authorization for his hydrocodone.  Not sedated.  Okay for outpatient follow-up.  See above.

## 2022-03-09 NOTE — Assessment & Plan Note (Signed)
   He is improving from COVID.  He knows to hold colchicine in the meantime.

## 2022-03-14 ENCOUNTER — Telehealth: Payer: Self-pay

## 2022-03-14 NOTE — Telephone Encounter (Signed)
PA was requested in [email or web address] for Aripiprazole 5 mg tabs. Key: [id number]. PA was approved, Coverage Starts on: 03/14/2022 12:00:00 AM, Coverage Ends on: 03/14/2023 12:00:00 AM.

## 2022-03-17 ENCOUNTER — Ambulatory Visit (HOSPITAL_COMMUNITY)
Admission: RE | Admit: 2022-03-17 | Discharge: 2022-03-17 | Disposition: A | Discharge status: Home / Self Care | Payer: Medicaid Other | Source: Ambulatory Visit | Attending: Urology | Admitting: Urology

## 2022-03-17 DIAGNOSIS — N200 Calculus of kidney: Secondary | ICD-10-CM | POA: Diagnosis not present

## 2022-03-18 ENCOUNTER — Encounter: Payer: Self-pay | Admitting: Urology

## 2022-03-18 ENCOUNTER — Ambulatory Visit (INDEPENDENT_AMBULATORY_CARE_PROVIDER_SITE_OTHER): Payer: Medicaid Other | Admitting: Urology

## 2022-03-18 VITALS — BP 134/82 | HR 85

## 2022-03-18 DIAGNOSIS — N200 Calculus of kidney: Secondary | ICD-10-CM | POA: Diagnosis not present

## 2022-03-18 LAB — URINALYSIS, ROUTINE W REFLEX MICROSCOPIC
Bilirubin, UA: NEGATIVE
Glucose, UA: NEGATIVE
Ketones, UA: NEGATIVE
Leukocytes,UA: NEGATIVE
Nitrite, UA: NEGATIVE
RBC, UA: NEGATIVE
Specific Gravity, UA: 1.015 (ref 1.005–1.030)
Urobilinogen, Ur: 0.2 mg/dL (ref 0.2–1.0)
pH, UA: 7 (ref 5.0–7.5)

## 2022-03-18 LAB — MICROSCOPIC EXAMINATION: Bacteria, UA: NONE SEEN

## 2022-03-18 MED ORDER — ALLOPURINOL 300 MG PO TABS: 300.0000 mg | ORAL_TABLET | Freq: Every day | ORAL | 3 refills | Status: DC

## 2022-03-18 MED ORDER — INDAPAMIDE 2.5 MG PO TABS: 2.5000 mg | ORAL_TABLET | Freq: Every day | ORAL | 3 refills | Status: AC

## 2022-03-18 MED ORDER — POTASSIUM CITRATE ER 15 MEQ (1620 MG) PO TBCR: 1.0000 | EXTENDED_RELEASE_TABLET | Freq: Two times a day (BID) | ORAL | 11 refills | Status: AC

## 2022-03-18 MED ORDER — TAMSULOSIN HCL 0.4 MG PO CAPS: 0.4000 mg | ORAL_CAPSULE | Freq: Every day | ORAL | 11 refills | Status: DC

## 2022-03-18 NOTE — Patient Instructions (Signed)

## 2022-03-18 NOTE — Progress Notes (Signed)
03/18/2022 9:16 AM   Casey Caldwell Apr 06, 1980 [id number]  Referring provider: Tonia Ghent, MD [address] [place],   [address]  Followup nephrolithiasis   HPI: Casey Caldwell is a 42yo here for followup for nephrolithiasis. He has intermittent bilateral flank pain. Renal; [place] and CT from 01/2022 shows small bilateral renal calculi. He is on indapamide, allopurinol, urocitK.    PMH: Past Medical History:  Diagnosis Date   Anxiety    Chronic pain syndrome    Depression    and panic/anxiety   Diabetes mellitus without complication (HCC)    Dyspnea    GERD (gastroesophageal reflux disease)    History of kidney stones    Hypertension    Insomnia    Kidney stones    Seizures (Casey Caldwell) Childhood    Surgical History: Past Surgical History:  Procedure Laterality Date   APPENDECTOMY  2011   CHOLECYSTECTOMY, LAPAROSCOPIC     CYSTOSCOPY W/ URETERAL STENT PLACEMENT Right 01/27/2020   Procedure: CYSTOSCOPY WITH RETROGRADE PYELOGRAM/URETERAL STENT PLACEMENT;  Surgeon: Casey Mons, MD;  Location: [facility];  Service: Urology;  Laterality: Right;   CYSTOSCOPY/URETEROSCOPY/HOLMIUM LASER Left 06/15/2017   Procedure: CYSTOSCOPY/LEFT URETEROSCOPYSTONE EXTRACTION/LEFT RETROGRADE;  Surgeon: Casey Gallo, MD;  Location: [facility];  Service: Urology;  Laterality: Left;  With STENT   GASTRIC ROUX-EN-Y N/A 01/11/2022   Procedure: LAPAROSCOPIC ROUX-EN-Y GASTRIC BYPASS WITH UPPER ENDOSCOPY;  Surgeon: Casey Pickerel, MD;  Location: [facility];  Service: General;  Laterality: N/A;   HYDROCELE EXCISION Left 06/2012   LAPAROSCOPIC CHOLECYSTECTOMY  1996   LITHOTRIPSY     four times over the years; most recent 10/08/12   ORCHIECTOMY Left 11/2012   SPINAL CORD STIMULATOR INSERTION  2015   Subsequently removed and replaced.   SPINAL CORD STIMULATOR INSERTION  2023   [employer] Plus 5.   TONSILLECTOMY  1992   URETEROSCOPY WITH HOLMIUM LASER LITHOTRIPSY Right 07/22/2020   Procedure:  CYSTOSCOPY, RIGHT RETROGRADE PYLEOGRAM, RIGHT URETEROSCOPY WITH HOLMIUM LASER LITHOTRIPSY, RIGHT URETERAL STENT;  Surgeon: Casey Frock, MD;  Location: [facility];  Service: Urology;  Laterality: Right;   WRIST GANGLION EXCISION  2002   Right    Home Medications:  Allergies as of 03/18/2022       Reactions   Gabapentin    Intolerant, sedation    Oxycodone    "feels like I am going to crawl out of my skin"   Phenergan [promethazine Hcl]    Unknown reaction   Tape Hives   Adhesive tape   Toradol [ketorolac Tromethamine]    "feels like I am going to crawl out of my skin"   Trileptal [oxcarbazepine] Other (See Comments)   Profound insomnia, worsening mood.    Wellbutrin [bupropion]    H/o SZ d/o. Causes mood swings        Medication List        Accurate as of March 18, 2022  9:16 AM. If you have any questions, ask your nurse or doctor.          allopurinol 300 MG tablet Commonly known as: ZYLOPRIM Take 1 tablet (300 mg total) by mouth daily.   ARIPiprazole 5 MG tablet Commonly known as: ABILIFY Take 0.5 tablets (2.5 mg total) by mouth daily.   Bariatric Multivitamins/Iron Caps Take 1 capsule by mouth 2 (two) times daily.   busPIRone 5 MG tablet Commonly known as: BUSPAR Take 1 tablet (5 mg total) by mouth 2 (two) times daily.   CALCIUM 500 +  D PO Take 1 tablet by mouth in the morning, at noon, and at bedtime.   citric acid-potassium citrate 1100-334 MG/5ML solution Commonly known as: POLYCITRA Take 5 mLs (10 mEq total) by mouth 3 (three) times daily.   colchicine 0.6 MG tablet TAKE ONE TABLET BY MOUTH TWICE A DAY AS NEEDED FOR GOUT FLARE   Dialyvite Vitamin D 5000 125 MCG (5000 UT) capsule Generic drug: Cholecalciferol Take 5,000 Units by mouth daily.   escitalopram 20 MG tablet Commonly known as: LEXAPRO Take 1 tablet (20 mg total) by mouth daily.   HYDROcodone-acetaminophen 5-325 MG tablet Commonly known as: NORCO/VICODIN Take 1-2 tablets by  mouth every 6 (six) hours as needed for severe pain (8 tabs or less per day).   HYDROmorphone 2 MG tablet Commonly known as: DILAUDID Take 1 tablet (2 mg total) by mouth every 4 (four) hours as needed for severe pain.   indapamide 2.5 MG tablet Commonly known as: LOZOL Take 1 tablet (2.5 mg total) by mouth daily.   loratadine 10 MG tablet Commonly known as: CLARITIN Take 1 tablet (10 mg total) by mouth daily as needed for allergies.   ondansetron 4 MG disintegrating tablet Commonly known as: ZOFRAN-ODT Take 1 tablet (4 mg total) by mouth every 6 (six) hours as needed for nausea or vomiting.   pantoprazole 40 MG tablet Commonly known as: PROTONIX Take 1 tablet (40 mg total) by mouth 2 (two) times daily.   polyethylene glycol powder 17 GM/SCOOP powder Commonly known as: GLYCOLAX/MIRALAX MIX 17G IN 4 TO 8 OUNCES OF FLUID AND TAKE TWICE DAILY IF NEEDED   sucralfate 1 GM/10ML suspension Commonly known as: Carafate Take 10 mLs (1 g total) by mouth 2 (two) times daily.   tamsulosin 0.4 MG Caps capsule Commonly known as: FLOMAX Take 1 capsule (0.4 mg total) by mouth daily.   triamcinolone cream 0.5 % Commonly known as: KENALOG Apply 1 application topically 2 (two) times daily.   Wegovy 0.25 MG/0.5ML Soaj Generic drug: Semaglutide-Weight Management Inject 0.25 mg into the skin once a week.   zolpidem 10 MG tablet Commonly known as: AMBIEN TAKE ONE TABLET BY MOUTH EVERY NIGHT AT BEDTIME AS NEEDED FOR SLEEP        Allergies:  Allergies  Allergen Reactions   Gabapentin     Intolerant, sedation    Oxycodone     "feels like I am going to crawl out of my skin"   Phenergan [Promethazine Hcl]     Unknown reaction   Tape Hives    Adhesive tape   Toradol [Ketorolac Tromethamine]     "feels like I am going to crawl out of my skin"   Trileptal [Oxcarbazepine] Other (See Comments)    Profound insomnia, worsening mood.    Wellbutrin [Bupropion]     H/o SZ d/o. Causes mood  swings    Family History: Family History  Problem Relation Age of Onset   Stroke Mother    Liver disease Mother    Irritable bowel syndrome Mother    Kidney disease Mother    Heart disease Father    Colon polyps Father    Colon cancer Neg Hx     Social History:  reports that he has never smoked. He has never used smokeless tobacco. He reports that he does not currently use alcohol. He reports that he does not use drugs.  ROS: All other review of systems were reviewed and are negative except what is noted above in HPI  Physical  Exam: BP 134/82   Pulse 85   Constitutional:  Alert and oriented, No acute distress. HEENT: [place] AT, moist mucus membranes.  Trachea midline, no masses. Cardiovascular: No clubbing, cyanosis, or edema. Respiratory: Normal respiratory effort, no increased work of breathing. GI: Abdomen is soft, nontender, nondistended, no abdominal masses GU: No CVA tenderness.  Lymph: No cervical or inguinal lymphadenopathy. Skin: No rashes, bruises or suspicious lesions. Neurologic: Grossly intact, no focal deficits, moving all 4 extremities. Psychiatric: Normal mood and affect.  Laboratory Data: Lab Results  Component Value Date   WBC 8.8 02/04/2022   HGB 16.4 02/04/2022   HCT 48.9 02/04/2022   MCV 88.9 02/04/2022   PLT 331 02/04/2022    Lab Results  Component Value Date   CREATININE 0.73 02/04/2022    No results found for: "PSA"  No results found for: "TESTOSTERONE"  Lab Results  Component Value Date   HGBA1C 6.2 (H) 01/07/2022    Urinalysis    Component Value Date/Time   COLORURINE YELLOW 08/03/2021 1549   APPEARANCEUR Clear 09/15/2021 1104   LABSPEC 1.025 08/03/2021 1549   PHURINE 5.5 08/03/2021 1549   GLUCOSEU Negative 09/15/2021 1104   HGBUR NEGATIVE 08/03/2021 1549   BILIRUBINUR Negative 09/15/2021 1104   KETONESUR NEGATIVE 08/03/2021 1549   PROTEINUR Negative 09/15/2021 1104   PROTEINUR NEGATIVE 08/03/2021 1549   UROBILINOGEN 1.0  10/20/2014 1820   NITRITE Negative 09/15/2021 1104   NITRITE NEGATIVE 08/03/2021 1549   LEUKOCYTESUR Negative 09/15/2021 1104   LEUKOCYTESUR NEGATIVE 08/03/2021 1549    Lab Results  Component Value Date   LABMICR Comment 09/15/2021   WBCUA None seen 03/05/2021   LABEPIT None seen 03/05/2021   MUCUS Present 03/05/2021   BACTERIA None seen 03/05/2021    Pertinent Imaging: Renal US yesterday: Images reviewed and discussed with the patient  Results for orders placed during the hospital encounter of 09/15/21  Abdomen 1 view (KUB)  Narrative CLINICAL DATA:  Chronic low back pain, nephrolithiasis.  EXAM: ABDOMEN - 1 VIEW  COMPARISON:  January 27, 2020 abdominal x-ray, CT scan abdomen and pelvis August 03, 2021  FINDINGS: The bowel gas pattern is normal. No radio-opaque calculi or other significant radiographic abnormality are seen. Prior cholecystectomy clips are identified. Spinal stimulator leads are unchanged. Right pelvic phlebolith is identified unchanged compared prior CT.  IMPRESSION: No evidence of renal calculi on x-ray. The CT noted small stones within the left kidney are not well seen on the x-ray.   Electronically Signed By: Abelardo Diesel M.D. On: 09/16/2021 09:47  No results found for this or any previous visit.  No results found for this or any previous visit.  No results found for this or any previous visit.  Results for orders placed during the hospital encounter of 03/17/22  US RENAL  Narrative CLINICAL DATA:  Nephrolithiasis  EXAM: RENAL / URINARY TRACT ULTRASOUND COMPLETE  COMPARISON:  None Available.  FINDINGS: Right Kidney:  Renal measurements: 12.6 x 6.4 x 5.4 cm = volume: 231 mL. Echogenicity within normal limits. No mass or hydronephrosis visualized.  Left Kidney:  Renal measurements: 12.5 x 6.8 x 5.8 cm = volume: 257 mL. Contains a 1 mm nonobstructive stone.  Bladder:  Limited evaluation due to lack of distention. The pre  void volume is 53.3 cc. Postvoid volume is 5.8 cc. No abnormalities identified.  Other:  None.  IMPRESSION: 1. 1 mm nonobstructive stone in the left kidney. 2. The bladder is poorly evaluated due to lack of distention. No abnormalities  identified.   Electronically Signed By: Dorise Bullion III M.D. On: 03/17/2022 18:53  No valid procedures specified. No results found for this or any previous visit.  Results for orders placed during the hospital encounter of 08/03/21  CT Renal Stone Study  Narrative CLINICAL DATA:  Flank pain.  EXAM: CT ABDOMEN AND PELVIS WITHOUT CONTRAST  TECHNIQUE: Multidetector CT imaging of the abdomen and pelvis was performed following the standard protocol without IV contrast.  RADIATION DOSE REDUCTION: This exam was performed according to the departmental dose-optimization program which includes automated exposure control, adjustment of the mA and/or kV according to patient size and/or use of iterative reconstruction technique.  COMPARISON:  Renal stone CT 05/22/2021  FINDINGS: Lower chest: No acute abnormality.  Hepatobiliary: Diffuse fatty infiltration of the liver is unchanged. The gallbladder is surgically absent. No biliary ductal dilatation.  Pancreas: Unremarkable. No pancreatic ductal dilatation or surrounding inflammatory changes.  Spleen: Normal in size without focal abnormality.  Adrenals/Urinary Tract: Punctate nonobstructing left renal calculi are again seen. No ureteral or bladder calculi are seen. There is no hydronephrosis or perinephric stranding. The bladder is completely decompressed. The adrenal glands are within normal limits.  Stomach/Bowel: Stomach is within normal limits. Appendix appears normal. No evidence of bowel wall thickening, distention, or inflammatory changes. Appendix is not seen.  Vascular/Lymphatic: No significant vascular findings are present. No enlarged abdominal or pelvic lymph  nodes.  Reproductive: Prostate is unremarkable.  Other: Small fat containing umbilical hernia.  No ascites.  Musculoskeletal: Thoracic spinal stimulator device present. No acute fractures.  IMPRESSION: 1. No acute abnormality. 2. Stable nonobstructing left renal calculi. 3. Hepatic steatosis.   Electronically Signed By: Ronney Asters M.D. On: 08/03/2021 16:30   Assessment & Plan:    1. Nephrolithiasis -followup 6 months with renal US - Urinalysis, Routine w reflex microscopic   No follow-ups on file.  Nicolette Bang, MD  [facility] [place]

## 2022-03-21 ENCOUNTER — Encounter: Payer: Self-pay | Admitting: Family Medicine

## 2022-03-24 ENCOUNTER — Other Ambulatory Visit: Payer: Self-pay | Admitting: Family Medicine

## 2022-03-24 ENCOUNTER — Encounter: Payer: Self-pay | Admitting: Urology

## 2022-03-24 DIAGNOSIS — G8929 Other chronic pain: Secondary | ICD-10-CM

## 2022-03-24 MED ORDER — HYDROCODONE-ACETAMINOPHEN 5-325 MG PO TABS: 1.0000 | ORAL_TABLET | Freq: Four times a day (QID) | ORAL | 0 refills | Status: DC | PRN

## 2022-03-24 MED ORDER — HYDROMORPHONE HCL 2 MG PO TABS: 2.0000 mg | ORAL_TABLET | ORAL | 0 refills | Status: DC | PRN

## 2022-03-24 NOTE — Assessment & Plan Note (Signed)
COVID19  Infection < 5 days from onset of symptoms in  vaccinated overweight individual with history of obesity, diabetes, hypertension and seizure disorder  No clear sign of bacterial infection at this time.   No SOB.  No red flags/need for ER visit or in-person exam at respiratory clinic at this time..    Pt higher risk for COVID complications given obesity, diabetes, hypertension and seizure disorder. GFR greater than 60 and no medication contraindications.  Start paxlovid 5 day course. Reviewed course of medication and side effect profile with patient in detail.   Symptomatic care with mucinex and cough suppressant at night. If SOB begins symptoms worsening.. have low threshold for in-person exam, if severe shortness of breath ER visit recommended.  Can monitor Oxygen saturation at home with home monitor if able to obtain.  Go to ER if O2 sat < 90% on room air.   Reviewed home care and provided information through [facility].  Recommended quarantine 5 days isolation recommended. Return to work day 6 and wear mask for 4 more days to complete 10 days. Provided info about prevention of spread of COVID 19.

## 2022-03-25 ENCOUNTER — Other Ambulatory Visit: Payer: Self-pay | Admitting: Family Medicine

## 2022-03-25 DIAGNOSIS — G8929 Other chronic pain: Secondary | ICD-10-CM

## 2022-03-25 MED ORDER — HYDROMORPHONE HCL 4 MG PO TABS: 2.0000 mg | ORAL_TABLET | ORAL | 0 refills | Status: DC | PRN

## 2022-03-25 NOTE — Telephone Encounter (Signed)
Pt called to check on status of message sent to [facility] via mychart? Call back # [phone]

## 2022-03-29 ENCOUNTER — Other Ambulatory Visit: Payer: Self-pay

## 2022-03-29 DIAGNOSIS — N200 Calculus of kidney: Secondary | ICD-10-CM

## 2022-04-05 ENCOUNTER — Encounter: Payer: Medicaid Other | Attending: General Surgery | Admitting: Dietician

## 2022-04-05 ENCOUNTER — Encounter: Payer: Self-pay | Admitting: Dietician

## 2022-04-05 VITALS — Ht 71.5 in | Wt 336.6 lb

## 2022-04-05 DIAGNOSIS — Z9884 Bariatric surgery status: Secondary | ICD-10-CM | POA: Insufficient documentation

## 2022-04-05 DIAGNOSIS — Z6841 Body Mass Index (BMI) 40.0 and over, adult: Secondary | ICD-10-CM | POA: Insufficient documentation

## 2022-04-05 DIAGNOSIS — Z713 Dietary counseling and surveillance: Secondary | ICD-10-CM | POA: Diagnosis not present

## 2022-04-05 DIAGNOSIS — E669 Obesity, unspecified: Secondary | ICD-10-CM

## 2022-04-05 NOTE — Progress Notes (Signed)
Bariatric Nutrition Follow-Up Visit Medical Nutrition Therapy  Appt Start Time: 10:19  End Time: 10:57  Surgery date: 01/11/2022 Surgery type: RYGB  Anthropometrics  Start weight at [facility]: 389.4 lbs (date: 11/23/2021)  Height: 71.5 in Weight today: 336.6 lbs BMI: 46.29 kg/m2     Clinical  Medical hx: Spinal stenosis with chronic pain syndrome, hypertension, GERD, hepatic steatosis, history of kidney stones, gout Medications: Hydrocodone, hydromorphone, Zofran, pot citrate, xyloprim, ambion, abilify, buspirone, Vit D  Labs: A1C 6.7; Vit D Notable signs/symptoms: none noted Any previous deficiencies? No Bowel Habits: Every day to every other day no complaints   Body Composition Scale 01/25/2022 04/05/2022  Current Body Weight 362.2 336.6  Total Body Fat % 40.2 38.3  Visceral Fat 36 32  Fat-Free Mass % 59.7 61.6   Total Body Water % 40.7 42.6  Muscle-Mass lbs 68.4 63.5  BMI 49.3 45.7  Body Fat Displacement           Torso  lbs 90.6 80.1         Left Leg  lbs 18.1 16.0         Right Leg  lbs 18.1 16.0         Left Arm  lbs 9.0 8.0         Right Arm   lbs 9.0 8.0     Lifestyle & Dietary Hx  Pt states the kidney stone pain is gone, stating he is still having back pain (spinal stenosis), and states he is planning to have back surgery with continued weight loss. Pt states he can not work due to being on pain killers. Pt states he has a spinal cord stimulate implanted with surgery. Pt states he does not weight at home, stating he does not want to obsess over it.  Pt weighs when he gets to these visits. Pt states starting this week he is getting back into physical activity. Pt states he is still doing about 3 protein shakes a day. Pt states SNAP benefit prevented him from getting some food and relied more on shakes, stating it is fixed now and will eat more food.  Estimated daily fluid intake: 60-80 oz Estimated daily protein intake: 80+ g Supplements: multivitamin (men's complete)  and calcium (1,000 mg x 3) Current average weekly physical activity: mall walking in the morning, 15-20 minutes, 3 times a week. Limited, due to back and groin pain.  24-Hr Dietary Recall First Meal: Protein shake, water,  Snack:  mixed nuts (tree nuts) Second Meal: yogurt, edamame, protein shake Snack:  mixed tree nuts or peanut butter Third Meal: chicken, broccoli or beans or shake or burrito bowl Snack: yogurt Beverages: water with flavoring, water  Post-Op Goals/ Signs/ Symptoms Using straws: no Drinking while eating: no Chewing/swallowing difficulties: no Changes in vision: no Changes to mood/headaches: no Hair loss/changes to skin/nails: no Difficulty focusing/concentrating: no Sweating: no Limb weakness: no Dizziness/lightheadedness: no Palpitations: no  Carbonated/caffeinated beverages: no N/V/D/C/Gas: no Abdominal pain: yes, after eating Dumping syndrome: no    NUTRITION DIAGNOSIS  Overweight/obesity ([place]-3.3) related to past poor dietary habits and physical inactivity as evidenced by completed bariatric surgery and following dietary guidelines for continued weight loss and healthy nutrition status.     NUTRITION INTERVENTION Nutrition counseling (C-1) and education (E-2) to facilitate bariatric surgery goals, including: Diet advancement to standard prep plan The importance of consuming adequate calories as well as certain nutrients daily due to the body's need for essential vitamins, minerals, and fats The importance of daily  physical activity and to reach a goal of at least 150 minutes of moderate to vigorous physical activity weekly (or as directed by their physician) due to benefits such as increased musculature and improved lab values Encouraged patient to honor their body's internal hunger and fullness cues.  Throughout the day, check in mentally and rate hunger. Stop eating when satisfied not full regardless of how much food is left on the plate.  Get more if  still hungry 20-30 minutes later.  The key is to honor satisfaction so throughout the meal, rate fullness factor and stop when comfortably satisfied not physically full. The key is to honor hunger and fullness without any feelings of guilt or shame.  Pay attention to what the internal cues are, rather than any external factors. This will enhance the confidence you have in listening to your own body and following those internal cues enabling you to increase how often you eat when you are hungry not out of appetite and stop when you are satisfied not full.  Encouraged pt to continue to eat balanced meals inclusive of non starchy vegetables 2 times a day 7 days a week Encouraged pt to continue to drink a minium 64 fluid ounces with half being plain water to satisfy proper hydration   The importance of intuitive eating specifically learning hunger-satiety cues and understanding the importance of learning a new body: The importance of mindful eating to avoid grazing behaviors   Goals -increase physical activity and include resistance exercises to maintain muscle mass. -Switch back to bariatric multivitamin; find 500 mg calcium citrate -continue to focus on food for protein, relying less on protein shakes  Handouts Provided Include  Standard Prep Plan handout  Learning Style & Readiness for Change Teaching method utilized: Visual & Auditory  Demonstrated degree of understanding via: Teach Back  Readiness Level: Preparation Barriers to learning/adherence to lifestyle change: pain after eating since surgery  RD's Notes for Next Visit Assess adherence to pt chosen goals Tolerance to protein, vegetables and fruit   MONITORING & EVALUATION Dietary intake, weekly physical activity, body weight.  Next Steps Patient is to follow-up in 1 months for post-op follow-up.

## 2022-04-19 ENCOUNTER — Encounter: Payer: Self-pay | Admitting: Family Medicine

## 2022-04-20 ENCOUNTER — Other Ambulatory Visit: Payer: Self-pay | Admitting: Family Medicine

## 2022-04-20 DIAGNOSIS — G8929 Other chronic pain: Secondary | ICD-10-CM

## 2022-04-20 MED ORDER — HYDROCODONE-ACETAMINOPHEN 5-325 MG PO TABS: 1.0000 | ORAL_TABLET | Freq: Four times a day (QID) | ORAL | 0 refills | Status: DC | PRN

## 2022-04-20 MED ORDER — HYDROMORPHONE HCL 4 MG PO TABS: 2.0000 mg | ORAL_TABLET | ORAL | 0 refills | Status: DC | PRN

## 2022-05-03 ENCOUNTER — Encounter (HOSPITAL_COMMUNITY): Payer: Self-pay | Admitting: General Surgery

## 2022-05-03 NOTE — Progress Notes (Signed)
Attempted to obtain medical history via telephone, unable to reach at this time. HIPAA compliant voicemail message left requesting return call to pre surgical testing department. 

## 2022-05-10 ENCOUNTER — Ambulatory Visit: Payer: Self-pay | Admitting: General Surgery

## 2022-05-10 ENCOUNTER — Ambulatory Visit (HOSPITAL_BASED_OUTPATIENT_CLINIC_OR_DEPARTMENT_OTHER): Payer: Medicaid Other | Admitting: Certified Registered"

## 2022-05-10 ENCOUNTER — Encounter (HOSPITAL_COMMUNITY): Payer: Self-pay | Admitting: General Surgery

## 2022-05-10 ENCOUNTER — Ambulatory Visit (HOSPITAL_COMMUNITY): Payer: Medicaid Other | Admitting: Certified Registered"

## 2022-05-10 ENCOUNTER — Encounter (HOSPITAL_COMMUNITY)
Admission: RE | Disposition: A | Discharge status: Home / Self Care | Payer: Self-pay | Source: Home / Self Care | Attending: General Surgery

## 2022-05-10 ENCOUNTER — Ambulatory Visit (HOSPITAL_COMMUNITY)
Admission: RE | Admit: 2022-05-10 | Discharge: 2022-05-10 | Disposition: A | Discharge status: Home / Self Care | Payer: Medicaid Other | Attending: General Surgery | Admitting: General Surgery

## 2022-05-10 ENCOUNTER — Other Ambulatory Visit: Payer: Self-pay

## 2022-05-10 ENCOUNTER — Encounter: Payer: Self-pay | Admitting: Family Medicine

## 2022-05-10 DIAGNOSIS — K760 Fatty (change of) liver, not elsewhere classified: Secondary | ICD-10-CM | POA: Diagnosis not present

## 2022-05-10 DIAGNOSIS — K219 Gastro-esophageal reflux disease without esophagitis: Secondary | ICD-10-CM | POA: Diagnosis not present

## 2022-05-10 DIAGNOSIS — Z9884 Bariatric surgery status: Secondary | ICD-10-CM | POA: Insufficient documentation

## 2022-05-10 DIAGNOSIS — E119 Type 2 diabetes mellitus without complications: Secondary | ICD-10-CM | POA: Diagnosis not present

## 2022-05-10 DIAGNOSIS — Z79899 Other long term (current) drug therapy: Secondary | ICD-10-CM | POA: Insufficient documentation

## 2022-05-10 DIAGNOSIS — I10 Essential (primary) hypertension: Secondary | ICD-10-CM

## 2022-05-10 DIAGNOSIS — Z79891 Long term (current) use of opiate analgesic: Secondary | ICD-10-CM | POA: Diagnosis not present

## 2022-05-10 DIAGNOSIS — G8929 Other chronic pain: Secondary | ICD-10-CM | POA: Insufficient documentation

## 2022-05-10 DIAGNOSIS — Z980 Intestinal bypass and anastomosis status: Secondary | ICD-10-CM | POA: Insufficient documentation

## 2022-05-10 DIAGNOSIS — R1013 Epigastric pain: Secondary | ICD-10-CM | POA: Insufficient documentation

## 2022-05-10 DIAGNOSIS — F418 Other specified anxiety disorders: Secondary | ICD-10-CM | POA: Diagnosis not present

## 2022-05-10 DIAGNOSIS — K2970 Gastritis, unspecified, without bleeding: Secondary | ICD-10-CM | POA: Diagnosis not present

## 2022-05-10 DIAGNOSIS — Z9682 Presence of neurostimulator: Secondary | ICD-10-CM | POA: Diagnosis not present

## 2022-05-10 HISTORY — PX: ESOPHAGOGASTRODUODENOSCOPY: SHX5428

## 2022-05-10 HISTORY — PX: BIOPSY: SHX5522

## 2022-05-10 SURGERY — EGD (ESOPHAGOGASTRODUODENOSCOPY)
Anesthesia: Monitor Anesthesia Care

## 2022-05-10 MED ORDER — SODIUM CHLORIDE 0.9 % IV SOLN: INTRAVENOUS | Status: DC

## 2022-05-10 MED ORDER — PROPOFOL 500 MG/50ML IV EMUL
INTRAVENOUS | Status: DC | PRN
  Administered 2022-05-10: 160 ug/kg/min via INTRAVENOUS

## 2022-05-10 MED ORDER — SUCRALFATE 1 G PO TABS: 1.0000 g | ORAL_TABLET | Freq: Four times a day (QID) | ORAL | 0 refills | Status: DC

## 2022-05-10 MED ORDER — LACTATED RINGERS IV SOLN: INTRAVENOUS | Status: DC

## 2022-05-10 MED ORDER — PROPOFOL 500 MG/50ML IV EMUL
INTRAVENOUS | Status: AC
  Filled 2022-05-10: qty 50

## 2022-05-10 MED ORDER — PROPOFOL 500 MG/50ML IV EMUL: INTRAVENOUS | Status: DC | PRN

## 2022-05-10 MED ORDER — PHENYLEPHRINE 80 MCG/ML (10ML) SYRINGE FOR IV PUSH (FOR BLOOD PRESSURE SUPPORT)
PREFILLED_SYRINGE | INTRAVENOUS | Status: DC | PRN
  Administered 2022-05-10: 80 ug via INTRAVENOUS

## 2022-05-10 MED ORDER — PROPOFOL 10 MG/ML IV BOLUS
INTRAVENOUS | Status: DC | PRN
  Administered 2022-05-10: 30 mg via INTRAVENOUS
  Administered 2022-05-10 (×2): 20 mg via INTRAVENOUS

## 2022-05-10 MED ORDER — LIDOCAINE VISCOUS HCL 2 % MT SOLN: 15.0000 mL | Freq: Once | OROMUCOSAL | Status: DC

## 2022-05-10 NOTE — H&P (Signed)
CC: here for egd, continued epigastric pain with eating  Requesting provider: n/a  HPI: Casey Caldwell is an 42 y.o. male who is here for upper endoscopy with possible biopsy.  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  He has been having epigastric pain with oral intake for the past few months.  The pain is generally immediate the with oral intake worse with solids.  He has been taking crushed Protonix and Carafate but is recently run out of liquid Carafate.  He states he does not have the funding for liquid Carafate.  He states its better but still there.  No fever or chills.  No vomiting.  Plan weight loss.  He is on chronic pain medication.  He presents today for upper endoscopy to evaluate for potential marginal ulcer.  Past Medical History:  Diagnosis Date   Anxiety    Chronic pain syndrome    Depression    and panic/anxiety   Dyspnea    GERD (gastroesophageal reflux disease)    History of kidney stones    Hypertension    Insomnia    Kidney stones    Seizures Childhood    Past Surgical History:  Procedure Laterality Date   APPENDECTOMY  2011   CHOLECYSTECTOMY, LAPAROSCOPIC     CYSTOSCOPY W/ URETERAL STENT PLACEMENT Right 01/27/2020   Procedure: CYSTOSCOPY WITH RETROGRADE PYELOGRAM/URETERAL STENT PLACEMENT;  Surgeon: Rene Paci, MD;  Location: [facility];  Service: Urology;  Laterality: Right;   CYSTOSCOPY/URETEROSCOPY/HOLMIUM LASER Left 06/15/2017   Procedure: CYSTOSCOPY/LEFT URETEROSCOPYSTONE EXTRACTION/LEFT RETROGRADE;  Surgeon: Marcine Matar, MD;  Location: [facility];  Service: Urology;  Laterality: Left;  With STENT   GASTRIC ROUX-EN-Y N/A 01/11/2022   Procedure: LAPAROSCOPIC ROUX-EN-Y GASTRIC BYPASS WITH UPPER ENDOSCOPY;  Surgeon: Gaynelle Adu, MD;  Location: [facility];  Service: General;  Laterality: N/A;   HYDROCELE EXCISION Left 06/2012   LAPAROSCOPIC CHOLECYSTECTOMY  1996   LITHOTRIPSY     four times over the  years; most recent 10/08/12   ORCHIECTOMY Left 11/2012   SPINAL CORD STIMULATOR INSERTION  2015   Subsequently removed and replaced.   SPINAL CORD STIMULATOR INSERTION  2023   [employer] 5.   TONSILLECTOMY  1992   URETEROSCOPY WITH HOLMIUM LASER LITHOTRIPSY Right 07/22/2020   Procedure: CYSTOSCOPY, RIGHT RETROGRADE PYLEOGRAM, RIGHT URETEROSCOPY WITH HOLMIUM LASER LITHOTRIPSY, RIGHT URETERAL STENT;  Surgeon: Sebastian Ache, MD;  Location: [facility];  Service: Urology;  Laterality: Right;   WRIST GANGLION EXCISION  2002   Right    Family History  Problem Relation Age of Onset   Stroke Mother    Liver disease Mother    Irritable bowel syndrome Mother    Kidney disease Mother    Heart disease Father    Colon polyps Father    Colon cancer Neg Hx     Social:  reports that he has never smoked. He has never used smokeless tobacco. He reports that he does not currently use alcohol. He reports that he does not use drugs.  Allergies:  Allergies  Allergen Reactions   Gabapentin     Intolerant, sedation    Oxycodone     "feels like I am going to crawl out of my skin"   Phenergan [Promethazine Hcl]     Unknown reaction   Tape Hives    Adhesive tape   Toradol [Ketorolac Tromethamine]     "feels like I am going to crawl out of my skin"   Trileptal [  Oxcarbazepine] Other (See Comments)    Profound insomnia, worsening mood.    Wellbutrin [Bupropion]     H/o SZ d/o. Causes mood swings    Medications: I have reviewed the patient's current medications.   ROS - all of the below systems have been reviewed with the patient and positives are indicated with bold text General: chills, fever or night sweats Eyes: blurry vision or double vision ENT: epistaxis or sore throat Allergy/Immunology: itchy/watery eyes or nasal congestion Hematologic/Lymphatic: bleeding problems, blood clots or swollen lymph nodes Endocrine: temperature intolerance or unexpected weight changes Breast: new or  changing breast lumps or nipple discharge Resp: cough, shortness of breath, or wheezing CV: chest pain or dyspnea on exertion GI: as per HPI GU: dysuria, trouble voiding, or hematuria MSK: joint pain or joint stiffness Neuro: TIA or stroke symptoms Derm: pruritus and skin lesion changes Psych: anxiety and depression  PE Blood pressure 105/69, pulse 70, temperature (!) 97 F (36.1 C), temperature source Temporal, resp. rate 20, height 5' 11.5" (1.816 m), weight (!) 149.7 kg, SpO2 96 %. Constitutional: NAD; conversant; no deformities Eyes: Moist conjunctiva; no lid lag; anicteric; PERRL Neck: Trachea midline; no thyromegaly Lungs: Normal respiratory effort; no tactile fremitus CV: RRR; no palpable thrills; no pitting edema GI: Abd soft nt nd; no palpable hepatosplenomegaly MSK: Normal gait; no clubbing/cyanosis Psychiatric: Appropriate affect; alert and oriented x3 Lymphatic: No palpable cervical or axillary lymphadenopathy Skin:no rash/lesions/jaundice  No results found for this or any previous visit (from the past 48 hour(s)).  No results found.  Imaging: reviewed  A/P: Casey Caldwell is an 42 y.o. 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  Plan is upper endoscopy with possible biopsy.  Discussed potential that endoscopy may be nondiagnostic.  However his symptoms are pretty classic for a marginal ulcer or perhaps exposed staples at his gastrojejunostomy.  He continues to have symptoms despite crushed twice daily PPI therapy along with Carafate.  Discussed risk and benefits of the planned procedure including but not limited to bleeding, infection, perforation and nondiagnostic results as well as need for additional procedures.  Will continue crushed PPI therapy after discharge as well  as Carafate.  Since he has a funding issue we will convert him to Carafate tablets and have him crush it and mix it in water to make a slurry which I discussed with him.  All questions asked and answered  Mary Sella. Andrey Campanile, MD, FACS General, Bariatric, & Minimally Invasive Surgery [facility]

## 2022-05-10 NOTE — Transfer of Care (Signed)
Immediate Anesthesia Transfer of Care Note  Patient: Casey Caldwell  Procedure(s) Performed: UPPER ENDOSCOPY WITH POSSIBLE BIOPSY BIOPSY  Patient Location: PACU  Anesthesia Type:MAC  Level of Consciousness: drowsy  Airway & Oxygen Therapy: Patient Spontanous Breathing and Patient connected to face mask oxygen  Post-op Assessment: Report given to RN, Post -op Vital signs reviewed and stable, and Patient moving all extremities X 4  Post vital signs: Reviewed and stable  Last Vitals:  Vitals Value Taken Time  BP 125/74   Temp    Pulse 61   Resp 12   SpO2 99     Last Pain:  Vitals:   05/10/22 1314  TempSrc: Temporal  PainSc: 5          Complications: No notable events documented.

## 2022-05-10 NOTE — Anesthesia Postprocedure Evaluation (Signed)
Anesthesia Post Note  Patient: Casey Caldwell  Procedure(s) Performed: UPPER ENDOSCOPY WITH POSSIBLE BIOPSY BIOPSY     Patient location during evaluation: PACU Anesthesia Type: MAC Level of consciousness: awake and alert Pain management: pain level controlled Vital Signs Assessment: post-procedure vital signs reviewed and stable Respiratory status: spontaneous breathing, nonlabored ventilation and respiratory function stable Cardiovascular status: stable and blood pressure returned to baseline Anesthetic complications: no   No notable events documented.  Last Vitals:  Vitals:   05/10/22 1531 05/10/22 1535  BP: 124/81   Pulse: 64 67  Resp:    Temp:    SpO2: 97% 97%    Last Pain:  Vitals:   05/10/22 1503  TempSrc: Temporal  PainSc:                  Beryle Lathe

## 2022-05-10 NOTE — Telephone Encounter (Signed)
Refill request for Ambien   LOV - 03/08/22 Next OV - not scheduled Last refill - 02/08/22 #90/0  Request 3 month supply be sent to [facility]

## 2022-05-10 NOTE — Anesthesia Preprocedure Evaluation (Addendum)
Anesthesia Evaluation  Patient identified by MRN, date of birth, ID band Patient awake    Reviewed: Allergy & Precautions, NPO status , Patient's Chart, lab work & pertinent test results  History of Anesthesia Complications Negative for: history of anesthetic complications  Airway Mallampati: III  TM Distance: >3 FB Neck ROM: Full    Dental  (+) Dental Advisory Given, Chipped   Pulmonary neg pulmonary ROS   Pulmonary exam normal        Cardiovascular hypertension (no longer on meds), Normal cardiovascular exam     Neuro/Psych Seizures -, Well Controlled,  PSYCHIATRIC DISORDERS Anxiety Depression       GI/Hepatic Neg liver ROS,GERD  Medicated and Controlled,, Hx gastric bypass    Endo/Other  diabetes, Type 2  Morbid obesity On GLP-1a   Renal/GU negative Renal ROS     Musculoskeletal negative musculoskeletal ROS (+)    Abdominal   Peds  Hematology negative hematology ROS (+)   Anesthesia Other Findings Chronic pain syndrome   Reproductive/Obstetrics                             Anesthesia Physical Anesthesia Plan  ASA: 3  Anesthesia Plan: MAC   Post-op Pain Management:    Induction:   PONV Risk Score and Plan: 1 and Propofol infusion and Treatment may vary due to age or medical condition  Airway Management Planned: Nasal Cannula and Natural Airway  Additional Equipment: None  Intra-op Plan:   Post-operative Plan:   Informed Consent: I have reviewed the patients History and Physical, chart, labs and discussed the procedure including the risks, benefits and alternatives for the proposed anesthesia with the patient or authorized representative who has indicated his/her understanding and acceptance.       Plan Discussed with: CRNA and Anesthesiologist  Anesthesia Plan Comments:        Anesthesia Quick Evaluation

## 2022-05-10 NOTE — Discharge Instructions (Signed)
YOU HAD AN ENDOSCOPIC PROCEDURE TODAY: Refer to the procedure report and other information in the discharge instructions given to you for any specific questions about what was found during the examination. If this information does not answer your questions, please call [facility] at [phone] to clarify.   YOU SHOULD EXPECT: Some feelings of bloating in the abdomen. Passage of more gas than usual. Walking can help get rid of the air that was put into your GI tract during the procedure and reduce the bloating. If you had a lower endoscopy (such as a colonoscopy or flexible sigmoidoscopy) you may notice spotting of blood in your stool or on the toilet paper. Some abdominal soreness may be present for a day or two, also.  DIET: Your first meal following the procedure should be a light meal and then it is ok to progress to your normal diet. A half-sandwich or bowl of soup is an example of a good first meal. Heavy or fried foods are harder to digest and may make you feel nauseous or bloated. Drink plenty of fluids but you should avoid alcoholic beverages for 24 hours. If you had a esophageal dilation, please see attached instructions for diet.    ACTIVITY: Your care partner should take you home directly after the procedure. You should plan to take it easy, moving slowly for the rest of the day. You can resume normal activity the day after the procedure however YOU SHOULD NOT DRIVE, use power tools, machinery or perform tasks that involve climbing or major physical exertion for 24 hours (because of the sedation medicines used during the test).   SYMPTOMS TO REPORT IMMEDIATELY: A gastroenterologist can be reached at any hour. Please call [phone] for any of the following symptoms:  Following lower endoscopy (colonoscopy, flexible sigmoidoscopy) Excessive amounts of blood in the stool  Significant tenderness, worsening of abdominal pains  Swelling of the abdomen that is new, acute  Fever  of 100 or higher  Following upper endoscopy (EGD, EUS, ERCP, esophageal dilation) Vomiting of blood or coffee ground material  New, significant abdominal pain  New, significant chest pain or pain under the shoulder blades  Painful or persistently difficult swallowing  New shortness of breath  Black, tarry-looking or red, bloody stools  FOLLOW UP:  If any biopsies were taken you will be contacted by phone or by letter within the next 1-3 weeks. Call [phone] if you have not heard about the biopsies in 3 weeks.  Please also call with any specific questions about appointments or follow up tests.

## 2022-05-10 NOTE — Anesthesia Procedure Notes (Signed)
Procedure Name: MAC Date/Time: 05/10/2022 2:19 PM  Performed by: Nelle Don, CRNAPre-anesthesia Checklist: Patient identified, Emergency Drugs available, Suction available and Patient being monitored Oxygen Delivery Method: Simple face mask

## 2022-05-10 NOTE — Op Note (Signed)
[facility] Patient Name: Casey Caldwell Procedure Date: 05/10/2022 MRN: [id number] Attending MD: Atilano Ina MD, MD, [id number] Date of Birth: 1980/11/20 CSN: [id number] Age: 42 Admit Type: Outpatient Procedure:                Upper GI endoscopy Indications:              Epigastric abdominal pain, Failure to respond to                            medical treatment, Status post gastric bypass Providers:                Mary Sella. Andrey Campanile MD, MD, Trudee Kuster, RN, Marja Kays, Technician Referring MD:              Medicines:                Monitored Anesthesia Care Complications:            No immediate complications. Estimated blood loss:                            Minimal. Estimated Blood Loss:     Estimated blood loss was minimal. Procedure:                Pre-Anesthesia Assessment:                           - Prior to the procedure, a History and Physical                            was performed, and patient medications and                            allergies were reviewed. The patient is competent.                            The risks and benefits of the procedure and the                            sedation options and risks were discussed with the                            patient. All questions were answered and informed                            consent was obtained. Patient identification and                            proposed procedure were verified in the endoscopy                            suite. Mental Status Examination: alert and  oriented. Airway Examination: Mallampati Class III                            (part of the uvula and soft palate visualized).                            Respiratory Examination: clear to auscultation. CV                            Examination: normal. Prophylactic Antibiotics: The                            patient does not require prophylactic antibiotics.                             Prior Anticoagulants: The patient has taken no                            anticoagulant or antiplatelet agents. ASA Grade                            Assessment: III - A patient with severe systemic                            disease. After reviewing the risks and benefits,                            the patient was deemed in satisfactory condition to                            undergo the procedure. The anesthesia plan was to                            use monitored anesthesia care (MAC). Immediately                            prior to administration of medications, the patient                            was re-assessed for adequacy to receive sedatives.                            The heart rate, respiratory rate, oxygen                            saturations, blood pressure, adequacy of pulmonary                            ventilation, and response to care were monitored                            throughout the procedure. The physical status of  the patient was re-assessed after the procedure.                           After obtaining informed consent, the endoscope was                            passed under direct vision. Throughout the                            procedure, the patient's blood pressure, pulse, and                            oxygen saturations were monitored continuously. The                            [id number]-H190 ([id number]) Olympus endoscope was introduced                            through the mouth, and advanced to the afferent and                            efferent jejunal loops. The upper GI endoscopy was                            accomplished without difficulty. The patient                            tolerated the procedure well. Scope In: Scope Out: Findings:      The proximal esophagus, mid esophagus and distal esophagus were normal.       Estimated blood loss: none. Z line at 38cm appeared grossly normal. no       signs of  significant esophagitis.      -Gastric pouch appeared healthy.      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 The gastrojejunal anastomosis was widely patent. The gastrojejunal       anastomosis was characterized by mild congestion, an intact appearance,       an intact staple line but did have some exposed/visible staples, no       visible sutures and the presence of no stomal ulceration. This was       traversed. No evidence of marginal ulcer. The pouch-to-jejunum limb was       characterized by healthy appearing mucosa. Roux limb mucosa healthy.       Patient did not have a significant blind end/candy cane limb of the roux       en y. Biopsies were taken in the gastric pouch with a cold forceps for       Helicobacter pylori testing. Estimated blood loss was minimal.       Verification of patient identification for the specimen was done by the       physician, nurse and technician using the patient's name and birth date.       There were several exposed staples which were removed with cold forceps,       not sent as part of specimen Estimated blood loss was minimal.  scope insertion 1425; scope removal 1449 Impression:               - Normal proximal esophagus, mid esophagus and                            distal esophagus.                           - Roux-en-Y gastrojejunostomy with gastrojejunal                            anastomosis characterized by congestion, an intact                            appearance, an intact staple line, no visible                            sutures and no stomal ulceration. visible exposed                            staples which were extracted. no marginal ulcer                            Biopsied. Moderate Sedation:      moderate sedation by CRNA      Moderate (conscious) sedation was personally administered by an       anesthesia professional. The following parameters were monitored: oxygen       saturation,  heart rate, blood pressure, respiratory rate, EKG, adequacy       of pulmonary ventilation, and response to care. Recommendation:           - Discharge patient to home (ambulatory).                           - Full liquid diet today.                           - Patient has a contact number available for                            emergencies. The signs and symptoms of potential                            delayed complications were discussed with the                            patient. Return to normal activities tomorrow.                            Written discharge instructions were provided to the                            patient.                           - Continue present medications.                           -  Await pathology results.                           - Return to my office in 3 weeks.                           - Use Protonix (pantoprazole) 40 mg PO BID for 1                            month.                           - Use sucralfate tablets 1 gram PO QID for 1 month. Procedure Code(s):        --- Professional ---                           [phone], Esophagogastroduodenoscopy, flexible,                            transoral; with biopsy, single or multiple Diagnosis Code(s):        --- Professional ---                           Z98.84, Bariatric surgery status                           R10.13, Epigastric pain CPT copyright 2022 American [employer] Association. All rights reserved. The codes documented in this report are preliminary and upon coder review may  be revised to meet current compliance requirements. Gaynelle Adu, MD Atilano Ina MD, MD 05/10/2022 3:23:26 PM This report has been signed electronically. Number of Addenda: 0

## 2022-05-11 ENCOUNTER — Other Ambulatory Visit: Payer: Self-pay | Admitting: Family Medicine

## 2022-05-11 NOTE — Telephone Encounter (Signed)
Refill request for ZOLPIDEM TARTRATE 10 MG TABLET   LOV - 03/08/22 Next OV - not scheduled Last refill - 02/08/22 #90/0

## 2022-05-11 NOTE — Telephone Encounter (Signed)
Sent. Thanks.   

## 2022-05-12 ENCOUNTER — Ambulatory Visit (HOSPITAL_COMMUNITY): Payer: Medicaid Other

## 2022-05-12 ENCOUNTER — Encounter (HOSPITAL_BASED_OUTPATIENT_CLINIC_OR_DEPARTMENT_OTHER): Payer: Self-pay | Admitting: Urology

## 2022-05-12 ENCOUNTER — Emergency Department (HOSPITAL_BASED_OUTPATIENT_CLINIC_OR_DEPARTMENT_OTHER): Payer: Medicaid Other

## 2022-05-12 ENCOUNTER — Emergency Department (HOSPITAL_BASED_OUTPATIENT_CLINIC_OR_DEPARTMENT_OTHER)
Admission: EM | Admit: 2022-05-12 | Discharge: 2022-05-12 | Disposition: A | Discharge status: Home / Self Care | Payer: Medicaid Other | Attending: Emergency Medicine | Admitting: Emergency Medicine

## 2022-05-12 ENCOUNTER — Other Ambulatory Visit: Payer: Self-pay

## 2022-05-12 DIAGNOSIS — N4889 Other specified disorders of penis: Secondary | ICD-10-CM | POA: Diagnosis not present

## 2022-05-12 DIAGNOSIS — R109 Unspecified abdominal pain: Secondary | ICD-10-CM | POA: Diagnosis not present

## 2022-05-12 DIAGNOSIS — R1030 Lower abdominal pain, unspecified: Secondary | ICD-10-CM | POA: Diagnosis not present

## 2022-05-12 LAB — BASIC METABOLIC PANEL
Anion gap: 9 (ref 5–15)
BUN: 13 mg/dL (ref 6–20)
CO2: 27 mmol/L (ref 22–32)
Calcium: 9.4 mg/dL (ref 8.9–10.3)
Chloride: 102 mmol/L (ref 98–111)
Creatinine, Ser: 0.73 mg/dL (ref 0.61–1.24)
GFR, Estimated: >60 mL/min (ref >60)
Glucose, Bld: 98 mg/dL (ref 70–99)
Potassium: 3.9 mmol/L (ref 3.5–5.1)
Sodium: 138 mmol/L (ref 135–145)

## 2022-05-12 LAB — CBC WITH DIFFERENTIAL/PLATELET
Abs Immature Granulocytes: 0.04 10*3/uL (ref 0.00–0.07)
Basophils Absolute: 0.1 10*3/uL (ref 0.0–0.1)
Basophils Relative: 1 %
Eosinophils Absolute: 0.6 10*3/uL — ABNORMAL HIGH (ref 0.0–0.5)
Eosinophils Relative: 5 %
HCT: 50.3 % (ref 39.0–52.0)
Hemoglobin: 17.2 g/dL — ABNORMAL HIGH (ref 13.0–17.0)
Immature Granulocytes: 0 %
Lymphocytes Relative: 26 %
Lymphs Abs: 2.9 10*3/uL (ref 0.7–4.0)
MCH: 29.9 pg (ref 26.0–34.0)
MCHC: 34.2 g/dL (ref 30.0–36.0)
MCV: 87.3 fL (ref 80.0–100.0)
Monocytes Absolute: 1 10*3/uL (ref 0.1–1.0)
Monocytes Relative: 9 %
Neutro Abs: 6.6 10*3/uL (ref 1.7–7.7)
Neutrophils Relative %: 59 %
Platelets: 360 10*3/uL (ref 150–400)
RBC: 5.76 MIL/uL (ref 4.22–5.81)
RDW: 14.2 % (ref 11.5–15.5)
WBC: 11 10*3/uL — ABNORMAL HIGH (ref 4.0–10.5)
nRBC: 0 % (ref 0.0–0.2)

## 2022-05-12 LAB — URINALYSIS, ROUTINE W REFLEX MICROSCOPIC
Bilirubin Urine: NEGATIVE
Glucose, UA: NEGATIVE mg/dL
Hgb urine dipstick: NEGATIVE
Ketones, ur: NEGATIVE mg/dL
Leukocytes,Ua: NEGATIVE
Nitrite: NEGATIVE
Protein, ur: NEGATIVE mg/dL
Specific Gravity, Urine: 1.025 (ref 1.005–1.030)
pH: 6 (ref 5.0–8.0)

## 2022-05-12 LAB — SURGICAL PATHOLOGY

## 2022-05-12 MED ORDER — ONDANSETRON HCL 4 MG/2ML IJ SOLN
4.0000 mg | Freq: Once | INTRAMUSCULAR | Status: AC
  Administered 2022-05-12: 4 mg via INTRAVENOUS
  Filled 2022-05-12: qty 2

## 2022-05-12 MED ORDER — SODIUM CHLORIDE 0.9 % IV BOLUS
1000.0000 mL | Freq: Once | INTRAVENOUS | Status: AC
  Administered 2022-05-12: 1000 mL via INTRAVENOUS

## 2022-05-12 MED ORDER — HYDROMORPHONE HCL 1 MG/ML IJ SOLN
1.0000 mg | Freq: Once | INTRAMUSCULAR | Status: AC
  Administered 2022-05-12: 1 mg via INTRAVENOUS
  Filled 2022-05-12: qty 1

## 2022-05-12 MED ORDER — FENTANYL CITRATE PF 50 MCG/ML IJ SOSY
50.0000 ug | PREFILLED_SYRINGE | Freq: Once | INTRAMUSCULAR | Status: AC
  Administered 2022-05-12: 50 ug via INTRAVENOUS
  Filled 2022-05-12: qty 1

## 2022-05-12 MED ORDER — HYDROMORPHONE HCL 1 MG/ML IJ SOLN
0.5000 mg | Freq: Once | INTRAMUSCULAR | Status: AC
  Administered 2022-05-12: 0.5 mg via INTRAVENOUS
  Filled 2022-05-12: qty 1

## 2022-05-12 NOTE — Discharge Instructions (Addendum)
As we discussed, there is no evidence of kidney stones in the collecting system.  There are several in the kidney that should not be causing issues today.  I would like for you to follow-up with your urologist for further evaluation.  You may return to the emergency department for any worsening symptoms.

## 2022-05-12 NOTE — ED Notes (Signed)
Patient transported to CT 

## 2022-05-12 NOTE — ED Triage Notes (Signed)
Pt states bilateral flank pain and groin pain that started 2 days ago and states got worse last night, states "feels like metal rod in penis"  State woke up in pool of sweat  Had endoscopy 2 days ago  Denies any blood in urine  H/o kidney stones

## 2022-05-12 NOTE — ED Provider Notes (Signed)
[place] [facility] Provider Note   CSN: [id number] Arrival date & time: 05/12/22  1201     History Chief Complaint  Patient presents with   Flank Pain    Casey Caldwell is a 42 y.o. male patient with history of kidney stones who presents to the emergency department today for further evaluation of bilateral flank pain and foreign body sensation to the penis.  This has been ongoing for some time but worse since last night.  Patient states he has a longstanding history of kidney stones and has had several lithotripsy in the past for kidney stones.  Patient is not inserting anything into the penile shaft.  He describes sensation as a "metal rod."  He denies any nausea or vomiting, fever, chills.  Denies urinary urgency, frequency, and dysuria. Of note, patient had an endoscopy 2 days ago. Chart review reveals this was normal without complication.   Flank Pain       Home Medications Prior to Admission medications   Medication Sig Start Date End Date Taking? Authorizing Provider  allopurinol (ZYLOPRIM) 300 MG tablet Take 1 tablet (300 mg total) by mouth daily. 03/18/22   McKenzie, Mardene Celeste, MD  ARIPiprazole (ABILIFY) 5 MG tablet Take 0.5 tablets (2.5 mg total) by mouth daily. 12/20/21   Joaquim Nam, MD  busPIRone (BUSPAR) 5 MG tablet Take 1 tablet (5 mg total) by mouth 2 (two) times daily. 12/20/21   Joaquim Nam, MD  Calcium Carb-Cholecalciferol (CALCIUM 500 + D PO) Take 1 tablet by mouth in the morning, at noon, and at bedtime.    [provider]  Cholecalciferol (DIALYVITE VITAMIN D 5000) 125 MCG (5000 UT) capsule Take 5,000 Units by mouth daily.    [provider]  citric acid-potassium citrate (POLYCITRA) 1100-334 MG/5ML solution Take 5 mLs (10 mEq total) by mouth 3 (three) times daily. 01/10/22   Joaquim Nam, MD  colchicine 0.6 MG tablet TAKE ONE TABLET BY MOUTH TWICE A DAY AS NEEDED FOR GOUT FLARE 12/20/21    Joaquim Nam, MD  escitalopram (LEXAPRO) 20 MG tablet Take 1 tablet (20 mg total) by mouth daily. 12/20/21   Joaquim Nam, MD  HYDROcodone-acetaminophen (NORCO/VICODIN) 5-325 MG tablet Take 1-2 tablets by mouth every 6 (six) hours as needed for severe pain (8 tabs or less per day). 04/20/22   Joaquim Nam, MD  HYDROmorphone (DILAUDID) 4 MG tablet Take 0.5 tablets (2 mg total) by mouth every 4 (four) hours as needed for severe pain. 04/20/22   Joaquim Nam, MD  indapamide (LOZOL) 2.5 MG tablet Take 1 tablet (2.5 mg total) by mouth daily. 03/18/22   McKenzie, Mardene Celeste, MD  loratadine (CLARITIN) 10 MG tablet Take 1 tablet (10 mg total) by mouth daily as needed for allergies. 12/20/21   Joaquim Nam, MD  Multiple Vitamins-Minerals (BARIATRIC MULTIVITAMINS/IRON) CAPS Take 1 capsule by mouth 2 (two) times daily.    [provider]  ondansetron (ZOFRAN-ODT) 4 MG disintegrating tablet Take 1 tablet (4 mg total) by mouth every 6 (six) hours as needed for nausea or vomiting. 01/12/22   Gaynelle Adu, MD  pantoprazole (PROTONIX) 40 MG tablet Take 1 tablet (40 mg total) by mouth 2 (two) times daily. 02/08/22   Joaquim Nam, MD  polyethylene glycol powder (GLYCOLAX/MIRALAX) 17 GM/SCOOP powder MIX 17G IN 4 TO 8 OUNCES OF FLUID AND TAKE TWICE DAILY IF NEEDED 12/28/21   Joaquim Nam, MD  Potassium Citrate 15 MEQ (1620 MG) TBCR Take 1 tablet by mouth 2 (two) times daily. 03/18/22   McKenzie, Mardene Celeste, MD  sucralfate (CARAFATE) 1 g tablet Take 1 tablet (1 g total) by mouth 4 (four) times daily. Crush & mix with water 05/10/22 06/09/22  Gaynelle Adu, MD  tamsulosin (FLOMAX) 0.4 MG CAPS capsule Take 1 capsule (0.4 mg total) by mouth daily after supper. 03/18/22   McKenzie, Mardene Celeste, MD  triamcinolone cream (KENALOG) 0.5 % Apply 1 application topically 2 (two) times daily. 01/13/21   Joaquim Nam, MD  zolpidem (AMBIEN) 10 MG tablet TAKE ONE TABLET BY MOUTH EVERY NIGHT AT BEDTIME AS NEEDED  FOR SLEEP 05/11/22   Joaquim Nam, MD      Allergies    Gabapentin, Oxycodone, Phenergan [promethazine hcl], Tape, Toradol [ketorolac tromethamine], Trileptal [oxcarbazepine], and Wellbutrin [bupropion]    Review of Systems   Review of Systems  Genitourinary:  Positive for flank pain.  All other systems reviewed and are negative.   Physical Exam Updated Vital Signs BP 113/71   Pulse 64   Temp 97.9 F (36.6 C) (Oral)   Resp 16   Ht 5\' 11"  (1.803 m)   Wt (!) 150 kg   SpO2 95%   BMI 46.12 kg/m  Physical Exam Vitals and nursing note reviewed.  Constitutional:      General: He is not in acute distress.    Appearance: Normal appearance.  HENT:     Head: Normocephalic and atraumatic.  Eyes:     General:        Right eye: No discharge.        Left eye: No discharge.  Cardiovascular:     Comments: Regular rate and rhythm.  S1/S2 are distinct without any evidence of murmur, rubs, or gallops.  Radial pulses are 2+ bilaterally.  Dorsalis pedis pulses are 2+ bilaterally.  No evidence of pedal edema. Pulmonary:     Comments: Clear to auscultation bilaterally.  Normal effort.  No respiratory distress.  No evidence of wheezes, rales, or rhonchi heard throughout. Abdominal:     General: Abdomen is flat. Bowel sounds are normal. There is no distension.     Tenderness: There is abdominal tenderness in the right lower quadrant, suprapubic area and left lower quadrant. There is right CVA tenderness and left CVA tenderness. There is no guarding or rebound.  Genitourinary:    Penis: Normal.   Musculoskeletal:        General: Normal range of motion.     Cervical back: Neck supple.  Skin:    General: Skin is warm and dry.     Findings: No rash.  Neurological:     General: No focal deficit present.     Mental Status: He is alert.  Psychiatric:        Mood and Affect: Mood normal.        Behavior: Behavior normal.     ED Results / Procedures / Treatments   Labs (all labs ordered  are listed, but only abnormal results are displayed) Labs Reviewed  CBC WITH DIFFERENTIAL/PLATELET - Abnormal; Notable for the following components:      Result Value   WBC 11.0 (*)    Hemoglobin 17.2 (*)    Eosinophils Absolute 0.6 (*)    All other components within normal limits  URINALYSIS, ROUTINE W REFLEX MICROSCOPIC  BASIC METABOLIC PANEL    EKG None  Radiology CT Renal Stone Study  Result Date: 05/12/2022 CLINICAL DATA:  Flank pain EXAM: CT ABDOMEN AND PELVIS WITHOUT CONTRAST TECHNIQUE: Multidetector CT imaging of the abdomen and pelvis was performed following the standard protocol without IV contrast. RADIATION DOSE REDUCTION: This exam was performed according to the departmental dose-optimization program which includes automated exposure control, adjustment of the mA and/or kV according to patient size and/or use of iterative reconstruction technique. COMPARISON:  Ultrasound 03/17/2022. CT 02/04/2022. Older exams as well FINDINGS: Lower chest: Lung bases are grossly clear.  No pleural effusion. Hepatobiliary: Previous cholecystectomy.  Fatty liver infiltration. Pancreas: Mild fatty atrophy of the pancreas.  No obvious mass Spleen: Normal in size without focal abnormality. Adrenals/Urinary Tract: The adrenal glands are preserved. There are 3 punctate nonobstructing left-sided renal stones. Small focus on the right side as well. No ureteral stones. No collecting system dilatation. The bladder is underdistended. Preserved contour. Stomach/Bowel: Scattered colonic stool. Large bowel has a normal course and caliber. Surgical changes from gastric bypass. Small bowel is nondilated. The appendix is not clearly seen in the right lower quadrant but no pericecal stranding or fluid. Vascular/Lymphatic: No significant vascular findings are present. No enlarged abdominal or pelvic lymph nodes. Reproductive: Prostate is unremarkable. Other: Small fat containing umbilical hernia. No free air or free  fluid. Musculoskeletal: Battery pack seen posterior to the right side of the lower lumbar spine with leads extending into the central canal, neurostimulator. Degenerative changes seen along the lower lumbar spine. Trace retrolisthesis seen of L4 on L5. Degenerative changes of the pelvis. IMPRESSION: Bilateral nonobstructing renal stones.  No ureteral stones. Scattered colonic stool.  Gastric bypass surgery. Fatty liver infiltration. Spinal stimulator Electronically Signed   By: Karen Kays M.D.   On: 05/12/2022 14:45    Procedures Procedures    Medications Ordered in ED Medications  HYDROmorphone (DILAUDID) injection 0.5 mg (has no administration in time range)  ondansetron (ZOFRAN) injection 4 mg (has no administration in time range)  sodium chloride 0.9 % bolus 1,000 mL ( Intravenous Stopped 05/12/22 1522)  fentaNYL (SUBLIMAZE) injection 50 mcg (50 mcg Intravenous Given 05/12/22 1405)  HYDROmorphone (DILAUDID) injection 1 mg (1 mg Intravenous Given 05/12/22 1451)    ED Course/ Medical Decision Making/ A&P Clinical Course as of 05/12/22 1643  Thu May 12, 2022  1450 CBC with Differential(!) There is evidence of leukocytosis. [CF]  1450 Basic metabolic panel Normal.  [CF]  1610 Urinalysis, Routine w reflex microscopic -Urine, Clean Catch Normal.  [CF]  1458 CT Renal Stone Study I personally ordered and interpreted this study and do not see evidence of ureteral stone. I do agree with the radiologist interpretation.  [CF]  1636 On reevaluation, patient is feeling slightly better with pain medication.  He states that he is ready to go home saying that there is no stone present in the ureter. [CF]    Clinical Course User Index [CF] Teressa Lower, PA-C   {   Click here for ABCD2, HEART and other calculators  Medical Decision Making PASCHAL BLANTON is a 42 y.o. male patient who presents to the emergency department today for further evaluation of bilateral flank pain and for bisection of  the penis.  I suspect this is likely kidney stone concern the patient is longstanding history.  Patient's lab work is unremarkable that was ordered in triage apart from leukocytosis.  I will plan to get a CT scan to further evaluate, pain control, and a bolus of fluids.  I will reassess once imaging results.  As highlighted in ED course, there is  no evidence of kidney stone.  I am not exactly sure where his pain is coming from.  There is no evidence of trauma.  Patient ready to go home.  I will give him 1 more dose of pain medication and nausea medicine and he will follow-up with his urologist.  If he is continuing to have this foreign body sensation in the urethra might be worth getting a cystoscopy.  There could be evidence of stricture.  However, he is not having any problems voiding. Strict precautions were discussed and he is safe for discharge now.   Amount and/or Complexity of Data Reviewed Labs: ordered. Decision-making details documented in ED Course. Radiology: ordered. Decision-making details documented in ED Course.  Risk Prescription drug management.   Final Clinical Impression(s) / ED Diagnoses Final diagnoses:  Flank pain    Rx / DC Orders ED Discharge Orders     None         Teressa Lower, [place] 05/12/22 1643    Terald Sleeper, MD 05/13/22 [phone]

## 2022-05-12 NOTE — ED Notes (Signed)
Reviewed discharge instructions with pt. Pt states understanding. Pt has transportation home . Ambulatory with cane

## 2022-05-12 NOTE — ED Notes (Signed)
Pt unable to urinate at this time, spec cup given while waiting 

## 2022-05-15 ENCOUNTER — Encounter (HOSPITAL_COMMUNITY): Payer: Self-pay | Admitting: General Surgery

## 2022-05-16 ENCOUNTER — Telehealth: Payer: Self-pay | Admitting: Family Medicine

## 2022-05-16 DIAGNOSIS — G8929 Other chronic pain: Secondary | ICD-10-CM

## 2022-05-16 NOTE — Telephone Encounter (Signed)
LOV - 03/08/22 NOV - not scheduled RF - Hydrocodone 04/20/22 #240/0         Hydromorphone 04/20/22 #15/0

## 2022-05-16 NOTE — Telephone Encounter (Signed)
Prescription Request  05/16/2022  LOV: 02/08/2022  What is the name of the medication or equipment? HYDROcodone-acetaminophen (NORCO/VICODIN) 5-325 MG tablet & HYDROmorphone (DILAUDID) 4 MG tablet   Have you contacted your pharmacy to request a refill? Yes   Which pharmacy would you like this sent to?   [facility] [id number] [place], [place] - [address] [place] [address] Phone: [phone] Fax: [phone]    Patient notified that their request is being sent to the clinical staff for review and that they should receive a response within 2 business days.   Please advise at Mobile [phone] (mobile)  Pt stated he is leaving to go out of town on [place] 4/25 & will need meds before departure. Pt states he was told by pharmacy that they can refill meds on Wed, 4/25 if Para March approves refill.

## 2022-05-17 MED ORDER — HYDROCODONE-ACETAMINOPHEN 5-325 MG PO TABS
1.0000 | ORAL_TABLET | Freq: Four times a day (QID) | ORAL | 0 refills | Status: DC | PRN
Start: 2022-05-17 — End: 2022-06-23

## 2022-05-17 MED ORDER — HYDROMORPHONE HCL 4 MG PO TABS
2.0000 mg | ORAL_TABLET | ORAL | 0 refills | Status: DC | PRN
Start: 2022-05-17 — End: 2022-05-31

## 2022-05-17 NOTE — Addendum Note (Signed)
Addended by: Joaquim Nam on: 05/17/2022 08:04 AM   Modules accepted: Orders

## 2022-05-17 NOTE — Telephone Encounter (Signed)
Sent. Thanks.   

## 2022-05-25 DIAGNOSIS — Z9689 Presence of other specified functional implants: Secondary | ICD-10-CM | POA: Diagnosis not present

## 2022-05-25 DIAGNOSIS — M549 Dorsalgia, unspecified: Secondary | ICD-10-CM | POA: Diagnosis not present

## 2022-05-25 DIAGNOSIS — G8929 Other chronic pain: Secondary | ICD-10-CM | POA: Diagnosis not present

## 2022-05-25 DIAGNOSIS — Z9884 Bariatric surgery status: Secondary | ICD-10-CM | POA: Diagnosis not present

## 2022-05-30 ENCOUNTER — Telehealth: Payer: Self-pay

## 2022-05-30 ENCOUNTER — Ambulatory Visit: Payer: Medicaid Other | Admitting: Family Medicine

## 2022-05-30 NOTE — Telephone Encounter (Signed)
[facility] Night - Client Nonclinical Telephone Record  [facility] Client [facility] Night - Client Client Site [facility] - Night Provider Crawford Givens "Clelia Croft MD Contact Type Call Who Is Calling Patient / Member / Family / Caregiver Caller Name Talha Quinley Caller Phone Number [phone] Patient Name Tallon Finkle Patient DOB 07-18-1980 Call Type Message Only Information Provided Reason for Call Request to [facility] Appointment Initial Comment Caller states they need to cancel appt. Patient request to speak to RN No Additional Comment Hours provided. Disp. Time Disposition Final User 05/29/2022 7:53:47 PM General Information Provided Yes Sheets, Ciara Call Closed By: Sheldon Silvan Transaction Date/Time: 05/29/2022 7:52:43 PM (ET  Sending note to lsc support;pt rescheduled by my chart but this note is dated and timed before that rescheduling.  Error per appt notes pt spoke with Michela Pitcher at front desk this morning to cancel 05/30/22 appt disregarding this note.

## 2022-05-31 ENCOUNTER — Ambulatory Visit (INDEPENDENT_AMBULATORY_CARE_PROVIDER_SITE_OTHER): Payer: Medicaid Other | Admitting: Family Medicine

## 2022-05-31 ENCOUNTER — Encounter: Payer: Self-pay | Admitting: Family Medicine

## 2022-05-31 VITALS — BP 110/72 | HR 76 | Temp 97.1°F | Ht 72.0 in | Wt 324.0 lb

## 2022-05-31 DIAGNOSIS — M549 Dorsalgia, unspecified: Secondary | ICD-10-CM

## 2022-05-31 DIAGNOSIS — G894 Chronic pain syndrome: Secondary | ICD-10-CM

## 2022-05-31 DIAGNOSIS — F419 Anxiety disorder, unspecified: Secondary | ICD-10-CM | POA: Diagnosis not present

## 2022-05-31 DIAGNOSIS — F32A Depression, unspecified: Secondary | ICD-10-CM

## 2022-05-31 DIAGNOSIS — G8929 Other chronic pain: Secondary | ICD-10-CM

## 2022-05-31 MED ORDER — PREGABALIN 25 MG PO CAPS
ORAL_CAPSULE | ORAL | 0 refills | Status: DC
Start: 2022-05-31 — End: 2022-10-18

## 2022-05-31 MED ORDER — HYDROMORPHONE HCL 4 MG PO TABS
2.0000 mg | ORAL_TABLET | ORAL | 0 refills | Status: DC | PRN
Start: 2022-05-31 — End: 2022-06-23

## 2022-05-31 NOTE — Patient Instructions (Addendum)
Stop the abilify and update me about your mood in about 1 week, sooner if needed.    Try adding on lyrica and see if that helps and is tolerable.   Let me know how that goes.   Take care.  Glad to see you.

## 2022-05-31 NOTE — Progress Notes (Unsigned)
Ongoing back pain.   Still on opiates at baseline with plan for surgery when BMI allows.  Down 70 lbs since 12/2022.  BMI 43.9.  he had endoscopy done, abd pain is better.  Not sedated.  Compliant with diet.  More back and L lateral leg pain, hypersensitive on the L leg.  Hypersensitivity on the lateral aspect of the left leg is clearly a change from prior.  He is going to check with spine clinic today.  Prev failed tx with gabapentin.  D/w pt about retrial of lyrica.  Routine Lyrica cautions discussed with patient. He had to use more hydromorphone in the meantime, ie daily for breakthrough pain.  Still using hydrocodone at baseline.  Mood has been good until more pain in the last few weeks.  More pain with more activity.  Less pain laying down.  He had to change chairs due to discomfort.  Discussed options regarding his medications for mood.  No FCNAVD.    Meds, vitals, and allergies reviewed.   ROS: Per HPI unless specifically indicated in ROS section   Nad Ncat Neck supple, no LA Rrr Ctab Abd soft not ttp Skin well-perfused. Left lateral leg is clearly hypersensitive to minimal touch without rash.  He has chronic changes on the abdominal wall and lower back without acute erythema. None respiratory distress but clearly uncomfortable from back pain.  Walking with a limp.

## 2022-06-01 NOTE — Assessment & Plan Note (Signed)
Continue hydrocodone.  Refill sent for hydromorphone for breakthrough pain.  Try adding on Lyrica 25 mg daily if tolerated and then increase to twice a day if tolerated and needed.  Routine sedation caution given to patient.  Not sedated.  Still okay for outpatient follow-up.  He is working diligently to get his weight down, to an acceptable BMI so that he can have back surgery.  That appears to be the most reasonable plan at this point.

## 2022-06-01 NOTE — Assessment & Plan Note (Signed)
Overall mood is better.  Discussed stopping Abilify and he will monitor his mood and update me as needed.  Routine cautions given to patient and he agrees to plan.

## 2022-06-08 ENCOUNTER — Telehealth: Payer: Self-pay | Admitting: Family Medicine

## 2022-06-08 MED ORDER — ARIPIPRAZOLE 5 MG PO TABS: 2.5000 mg | ORAL_TABLET | Freq: Every day | ORAL | Status: DC

## 2022-06-08 NOTE — Telephone Encounter (Signed)
Please update patient and would restart abilify/aripiprazole 2.5mg  a day, 1/2 of 5mg  tab.  Let me know if that isn't helping.  Thanks.

## 2022-06-08 NOTE — Telephone Encounter (Signed)
Patient was advised to call and give Dr Para March an update after their last OV.Patient called in today to let Dr Para March know that he started to feel weird after being off of his mood stabilizer(he didn't know the name of the medication),so he is going to start back on them.

## 2022-06-08 NOTE — Telephone Encounter (Signed)
Per ov note; patient was to stop Ablilify

## 2022-06-08 NOTE — Telephone Encounter (Signed)
Message sent to patient

## 2022-06-09 ENCOUNTER — Telehealth: Payer: Self-pay

## 2022-06-09 ENCOUNTER — Other Ambulatory Visit (HOSPITAL_COMMUNITY): Payer: Self-pay

## 2022-06-09 NOTE — Telephone Encounter (Signed)
PA approved from 06/09/2022-12/05/2022

## 2022-06-09 NOTE — Telephone Encounter (Signed)
PA has been submitted, see additional encounter for update

## 2022-06-09 NOTE — Telephone Encounter (Signed)
PA request received via CMM/provider for HYDROcodone-Acetaminophen 5-325MG  tablets  PA has been submitted to [facility] Medicaid and is pending determination  Key: [employer]

## 2022-06-21 ENCOUNTER — Encounter: Payer: Self-pay | Admitting: Family Medicine

## 2022-06-22 DIAGNOSIS — M48062 Spinal stenosis, lumbar region with neurogenic claudication: Secondary | ICD-10-CM | POA: Diagnosis not present

## 2022-06-22 DIAGNOSIS — M519 Unspecified thoracic, thoracolumbar and lumbosacral intervertebral disc disorder: Secondary | ICD-10-CM | POA: Diagnosis not present

## 2022-06-22 DIAGNOSIS — M545 Low back pain, unspecified: Secondary | ICD-10-CM | POA: Diagnosis not present

## 2022-06-22 DIAGNOSIS — M5416 Radiculopathy, lumbar region: Secondary | ICD-10-CM | POA: Diagnosis not present

## 2022-06-22 DIAGNOSIS — M47816 Spondylosis without myelopathy or radiculopathy, lumbar region: Secondary | ICD-10-CM | POA: Diagnosis not present

## 2022-06-22 NOTE — Telephone Encounter (Signed)
LAST APPOINTMENT DATE: 06/08/2022 NEXT APPOINTMENT DATE: Visit date not found   Hydrocodone LAST REFILL: 05/17/2022  QTY: #240 tab w/ no refill. 30 day supply   Hydromorphone  LAST REFILL: 05/31/2022  QTY: #15 tab w/ no refill

## 2022-06-23 ENCOUNTER — Other Ambulatory Visit: Payer: Self-pay | Admitting: Family Medicine

## 2022-06-23 ENCOUNTER — Telehealth: Payer: Self-pay

## 2022-06-23 ENCOUNTER — Other Ambulatory Visit (HOSPITAL_COMMUNITY): Payer: Self-pay

## 2022-06-23 DIAGNOSIS — G8929 Other chronic pain: Secondary | ICD-10-CM

## 2022-06-23 MED ORDER — HYDROCODONE-ACETAMINOPHEN 5-325 MG PO TABS
1.0000 | ORAL_TABLET | Freq: Four times a day (QID) | ORAL | 0 refills | Status: DC | PRN
Start: 2022-06-23 — End: 2022-08-17

## 2022-06-23 MED ORDER — HYDROMORPHONE HCL 4 MG PO TABS
2.0000 mg | ORAL_TABLET | ORAL | 0 refills | Status: DC | PRN
Start: 2022-06-23 — End: 2022-07-07

## 2022-06-23 NOTE — Telephone Encounter (Signed)
Pharmacy Patient Advocate Encounter   Received notification from [facility] that prior authorization for HYDROmorphone HCl 4MG  tablets is required/requested.   PA submitted to [employer] [place] Medicaid via CoverMyMeds Key or (Medicaid) confirmation # [id number] Status is pending

## 2022-06-24 ENCOUNTER — Other Ambulatory Visit (HOSPITAL_COMMUNITY): Payer: Self-pay

## 2022-06-24 NOTE — Telephone Encounter (Signed)
Patient Advocate Encounter  Prior Authorization for HYDROmorphone HCl 4MG  tablets has been approved with [employer] [place] Medicaid.    PA# [id number] Effective dates: 06/23/22 through 12/20/22

## 2022-06-28 ENCOUNTER — Ambulatory Visit: Payer: Medicaid Other | Admitting: Dietician

## 2022-07-06 ENCOUNTER — Encounter: Payer: Self-pay | Admitting: Family Medicine

## 2022-07-06 ENCOUNTER — Telehealth: Payer: Self-pay | Admitting: Family Medicine

## 2022-07-06 ENCOUNTER — Other Ambulatory Visit: Payer: Self-pay | Admitting: Family Medicine

## 2022-07-06 DIAGNOSIS — G8929 Other chronic pain: Secondary | ICD-10-CM

## 2022-07-06 NOTE — Telephone Encounter (Signed)
See MyChart message and get update on patient regarding his pain level and medication use.  Thanks.

## 2022-07-07 MED ORDER — HYDROMORPHONE HCL 4 MG PO TABS
2.0000 mg | ORAL_TABLET | ORAL | 0 refills | Status: DC | PRN
Start: 2022-07-07 — End: 2022-07-13

## 2022-07-07 MED ORDER — HYDROMORPHONE HCL 4 MG PO TABS
2.0000 mg | ORAL_TABLET | ORAL | 0 refills | Status: DC | PRN
Start: 2022-07-07 — End: 2022-07-07

## 2022-07-07 NOTE — Addendum Note (Signed)
Addended by: Joaquim Nam on: 07/07/2022 04:41 PM   Modules accepted: Orders

## 2022-07-07 NOTE — Telephone Encounter (Signed)
Spoke with patient and he is taking hydromorphone 1 tablet once a day; usually in the afternoon when the pain gets to be too much. He is having a lot of pain in his leg; states it has gotten worse in the last month or two. Patient states that his pain level is anywhere from a 7 to a 9 depending on the day. He is going out of town next Wednesday for a week.

## 2022-07-07 NOTE — Telephone Encounter (Signed)
Patient called in and stated that [facility] [place] doesn't have the medication. He stated that the [facility] [id number] [place], [place] - [address] has it and would like for it to be sent there. Thank you!

## 2022-07-07 NOTE — Telephone Encounter (Signed)
Sent. Thanks.   

## 2022-07-07 NOTE — Telephone Encounter (Signed)
Noted.  I sent the rx and will await his f/u MRI and surgery notes.  Thanks.

## 2022-07-07 NOTE — Addendum Note (Signed)
Addended by: Joaquim Nam on: 07/07/2022 02:05 PM   Modules accepted: Orders

## 2022-07-13 ENCOUNTER — Other Ambulatory Visit: Payer: Self-pay | Admitting: Family Medicine

## 2022-07-13 DIAGNOSIS — G8929 Other chronic pain: Secondary | ICD-10-CM

## 2022-07-13 MED ORDER — HYDROMORPHONE HCL 4 MG PO TABS
2.0000 mg | ORAL_TABLET | ORAL | 0 refills | Status: DC | PRN
Start: 2022-07-13 — End: 2022-08-04

## 2022-07-13 NOTE — Telephone Encounter (Signed)
Please check to make sure that the pharmacy has his prescription.  [facility] [id number] - [place], [place] - [address]   Thanks.

## 2022-07-28 ENCOUNTER — Encounter: Payer: Self-pay | Admitting: Family Medicine

## 2022-07-29 NOTE — Telephone Encounter (Signed)
Last filled 05-11-22 #90 Last OV 05-31-22 No Future OV [facility]

## 2022-07-31 MED ORDER — ZOLPIDEM TARTRATE 10 MG PO TABS: ORAL_TABLET | ORAL | 0 refills | Status: DC

## 2022-08-04 ENCOUNTER — Telehealth: Payer: Medicaid Other | Admitting: Family Medicine

## 2022-08-04 ENCOUNTER — Encounter: Payer: Self-pay | Admitting: Family Medicine

## 2022-08-04 VITALS — Ht 72.0 in | Wt 305.0 lb

## 2022-08-04 DIAGNOSIS — F419 Anxiety disorder, unspecified: Secondary | ICD-10-CM

## 2022-08-04 DIAGNOSIS — G894 Chronic pain syndrome: Secondary | ICD-10-CM | POA: Diagnosis not present

## 2022-08-04 DIAGNOSIS — F32A Depression, unspecified: Secondary | ICD-10-CM | POA: Diagnosis not present

## 2022-08-04 DIAGNOSIS — G8929 Other chronic pain: Secondary | ICD-10-CM

## 2022-08-04 MED ORDER — ARIPIPRAZOLE 5 MG PO TABS: 2.5000 mg | ORAL_TABLET | Freq: Every day | ORAL | 1 refills | Status: DC

## 2022-08-04 MED ORDER — HYDROMORPHONE HCL 4 MG PO TABS
2.0000 mg | ORAL_TABLET | ORAL | 0 refills | Status: DC | PRN
Start: 2022-08-04 — End: 2022-08-17

## 2022-08-04 NOTE — Progress Notes (Signed)
Virtual visit completed through caregility or similar program Patient location: home  Provider location: [place] at [facility], office  Participants: Patient and me (unless stated otherwise below)  Limitations and rationale for visit method d/w patient.  Patient agreed to proceed.  Patient identified by 2 identifiers.  CC: follow up  HPI:  He is able to swallow pills/food now.  Diet d/w pt.  S/p gastric surgery.  Weight d/w pt.  Stress eating d/w pt.   Back pain.  He had f/u in the meantime with surgery clinic.  Pain was recently worse and that affected his mood.  He is likely going to have f/u imaging done and the address the plan re: his back at that point.  The waiting is difficult for the patient, discussed.  He can't get his implant into MRI mode and has contacted the device support team in the meantime.   He is still having leg pain, hypersensitivity in the leg, back pain and groin pain.  He is more anxious in the meantime, d/w about social support and referral for counseling.  He'll check on a specific clinic that his ex-wife saw and update me if he needs a referral.  No SI.    He was denied on disability with appeal pending. He doesn't have a date set for that.    He failed taper on abilify, would continue, since his mood had improved on the medication.  Rx sent.   Meds and allergies reviewed.   ROS: Per HPI unless specifically indicated in ROS section   NAD Speech wnl  A/P: Chronic back pain.  He is awaiting follow-up from the surgery clinic regarding imaging and possible surgery.  The pain and the waiting room that have affected his mood but he still okay for outpatient follow-up.  I would continue hydrocodone with as needed hydromorphone at baseline if needed.  He is not sedated.    Depression.  Exacerbated by pain, the above.  Continue Abilify and BuSpar.  Still okay for outpatient follow-up.

## 2022-08-07 NOTE — Assessment & Plan Note (Signed)
  Depression.  Exacerbated by pain, the above.  Continue Abilify and BuSpar.  Still okay for outpatient follow-up.

## 2022-08-07 NOTE — Assessment & Plan Note (Signed)
Chronic back pain.  He is awaiting follow-up from the surgery clinic regarding imaging and possible surgery.  The pain and the waiting room that have affected his mood but he still okay for outpatient follow-up.  I would continue hydrocodone with as needed hydromorphone at baseline if needed.  He is not sedated.

## 2022-08-17 ENCOUNTER — Encounter: Payer: Self-pay | Admitting: Family Medicine

## 2022-08-17 ENCOUNTER — Other Ambulatory Visit: Payer: Self-pay | Admitting: Family Medicine

## 2022-08-17 DIAGNOSIS — G8929 Other chronic pain: Secondary | ICD-10-CM

## 2022-08-17 MED ORDER — HYDROCODONE-ACETAMINOPHEN 5-325 MG PO TABS
1.0000 | ORAL_TABLET | Freq: Four times a day (QID) | ORAL | 0 refills | Status: DC | PRN
Start: 2022-08-17 — End: 2022-09-19

## 2022-08-17 MED ORDER — HYDROMORPHONE HCL 4 MG PO TABS
2.0000 mg | ORAL_TABLET | ORAL | 0 refills | Status: DC | PRN
Start: 2022-08-17 — End: 2022-09-19

## 2022-08-17 NOTE — Telephone Encounter (Signed)
LOV - 08/04/22 NOV - not scheduled RF - Hydromorphone 08/04/22 #15/0         Hydrocodone 06/23/22 #240/0

## 2022-08-17 NOTE — Telephone Encounter (Signed)
Sent. Thanks.   

## 2022-08-19 ENCOUNTER — Encounter: Payer: Self-pay | Admitting: Family Medicine

## 2022-08-19 DIAGNOSIS — R739 Hyperglycemia, unspecified: Secondary | ICD-10-CM

## 2022-09-08 ENCOUNTER — Encounter: Payer: Self-pay | Admitting: Family Medicine

## 2022-09-09 NOTE — Telephone Encounter (Addendum)
LOV - 08/04/22 NOV - not scheduled RF - 08/17/22 #15/0

## 2022-09-11 ENCOUNTER — Other Ambulatory Visit: Payer: Self-pay | Admitting: Family Medicine

## 2022-09-11 DIAGNOSIS — G8929 Other chronic pain: Secondary | ICD-10-CM

## 2022-09-16 ENCOUNTER — Ambulatory Visit (HOSPITAL_COMMUNITY): Payer: Medicaid Other

## 2022-09-18 ENCOUNTER — Encounter: Payer: Self-pay | Admitting: Family Medicine

## 2022-09-18 DIAGNOSIS — G8929 Other chronic pain: Secondary | ICD-10-CM

## 2022-09-19 MED ORDER — HYDROCODONE-ACETAMINOPHEN 5-325 MG PO TABS
1.0000 | ORAL_TABLET | Freq: Four times a day (QID) | ORAL | 0 refills | Status: DC | PRN
Start: 2022-09-19 — End: 2022-10-18

## 2022-09-19 MED ORDER — HYDROMORPHONE HCL 4 MG PO TABS
2.0000 mg | ORAL_TABLET | ORAL | 0 refills | Status: DC | PRN
Start: 2022-09-19 — End: 2022-10-05

## 2022-09-19 MED ORDER — BUSPIRONE HCL 5 MG PO TABS: 5.0000 mg | ORAL_TABLET | Freq: Two times a day (BID) | ORAL | 1 refills | Status: DC

## 2022-09-19 NOTE — Telephone Encounter (Signed)
Refills sent

## 2022-09-21 ENCOUNTER — Ambulatory Visit: Payer: Medicaid Other | Admitting: Urology

## 2022-09-27 DIAGNOSIS — M5126 Other intervertebral disc displacement, lumbar region: Secondary | ICD-10-CM | POA: Diagnosis not present

## 2022-09-27 DIAGNOSIS — M545 Low back pain, unspecified: Secondary | ICD-10-CM | POA: Diagnosis not present

## 2022-09-27 DIAGNOSIS — R200 Anesthesia of skin: Secondary | ICD-10-CM | POA: Diagnosis not present

## 2022-09-28 DIAGNOSIS — R519 Headache, unspecified: Secondary | ICD-10-CM | POA: Diagnosis not present

## 2022-09-28 DIAGNOSIS — Z79899 Other long term (current) drug therapy: Secondary | ICD-10-CM | POA: Diagnosis not present

## 2022-09-28 DIAGNOSIS — Z9682 Presence of neurostimulator: Secondary | ICD-10-CM | POA: Diagnosis not present

## 2022-09-28 DIAGNOSIS — G4489 Other headache syndrome: Secondary | ICD-10-CM | POA: Diagnosis not present

## 2022-09-28 DIAGNOSIS — M5416 Radiculopathy, lumbar region: Secondary | ICD-10-CM | POA: Diagnosis not present

## 2022-09-28 DIAGNOSIS — R110 Nausea: Secondary | ICD-10-CM | POA: Diagnosis not present

## 2022-09-28 DIAGNOSIS — R42 Dizziness and giddiness: Secondary | ICD-10-CM | POA: Diagnosis not present

## 2022-09-28 DIAGNOSIS — M48061 Spinal stenosis, lumbar region without neurogenic claudication: Secondary | ICD-10-CM | POA: Diagnosis not present

## 2022-09-28 DIAGNOSIS — G971 Other reaction to spinal and lumbar puncture: Secondary | ICD-10-CM | POA: Diagnosis not present

## 2022-09-29 DIAGNOSIS — M47816 Spondylosis without myelopathy or radiculopathy, lumbar region: Secondary | ICD-10-CM | POA: Diagnosis not present

## 2022-09-29 DIAGNOSIS — M48062 Spinal stenosis, lumbar region with neurogenic claudication: Secondary | ICD-10-CM | POA: Diagnosis not present

## 2022-09-29 DIAGNOSIS — M519 Unspecified thoracic, thoracolumbar and lumbosacral intervertebral disc disorder: Secondary | ICD-10-CM | POA: Diagnosis not present

## 2022-09-30 ENCOUNTER — Telehealth: Payer: Self-pay | Admitting: Family Medicine

## 2022-09-30 ENCOUNTER — Encounter: Payer: Self-pay | Admitting: Family Medicine

## 2022-09-30 DIAGNOSIS — M545 Low back pain, unspecified: Secondary | ICD-10-CM | POA: Diagnosis not present

## 2022-09-30 DIAGNOSIS — Z5329 Procedure and treatment not carried out because of patient's decision for other reasons: Secondary | ICD-10-CM | POA: Diagnosis not present

## 2022-09-30 DIAGNOSIS — M549 Dorsalgia, unspecified: Secondary | ICD-10-CM | POA: Diagnosis not present

## 2022-09-30 DIAGNOSIS — G971 Other reaction to spinal and lumbar puncture: Secondary | ICD-10-CM | POA: Diagnosis not present

## 2022-09-30 NOTE — Telephone Encounter (Signed)
Spoke to our referral coordinator. She said the specialist that did the CT Scan should do the referral to the specialist for the blood patch since they performed the procedure that caused the problem.  He will let us know if they give him any issues.

## 2022-09-30 NOTE — Telephone Encounter (Signed)
Patient needs a  referral to get a blood patch from [facility] management doctor. Patient had a spinal leak after his CT myelogram that he had on Tuesday.   Phone number:[facility]  [phone]

## 2022-10-01 ENCOUNTER — Encounter: Payer: Self-pay | Admitting: Family Medicine

## 2022-10-01 DIAGNOSIS — G971 Other reaction to spinal and lumbar puncture: Secondary | ICD-10-CM | POA: Diagnosis not present

## 2022-10-01 DIAGNOSIS — K219 Gastro-esophageal reflux disease without esophagitis: Secondary | ICD-10-CM | POA: Diagnosis not present

## 2022-10-01 DIAGNOSIS — M549 Dorsalgia, unspecified: Secondary | ICD-10-CM | POA: Diagnosis not present

## 2022-10-01 DIAGNOSIS — G8929 Other chronic pain: Secondary | ICD-10-CM | POA: Diagnosis not present

## 2022-10-01 DIAGNOSIS — F32A Depression, unspecified: Secondary | ICD-10-CM | POA: Diagnosis not present

## 2022-10-01 DIAGNOSIS — R519 Headache, unspecified: Secondary | ICD-10-CM | POA: Diagnosis not present

## 2022-10-02 NOTE — Telephone Encounter (Signed)
I attempted to call patient when I saw his messages.  Left message on voicemail.  In the meantime he called me back.  He contracted for safety-we talked about it and I advised him if he thought about hurting himself or hurting someone else to pick up the phone call for help and he agreed.    He has had multiple ER visits.  He is still dealing with a headache but it is better laying down.  He is going to have follow-up Monday with a specialty service to see if they can perform the blood patch.  I asked him to update me after that.  It appears that he has his referrals in place through outside providers and did not need orders right now.  If patient does not call back on Monday morning then please call him on Monday afternoon to get an update.

## 2022-10-03 DIAGNOSIS — Z888 Allergy status to other drugs, medicaments and biological substances status: Secondary | ICD-10-CM | POA: Diagnosis not present

## 2022-10-03 DIAGNOSIS — Z79899 Other long term (current) drug therapy: Secondary | ICD-10-CM | POA: Diagnosis not present

## 2022-10-03 DIAGNOSIS — Z87442 Personal history of urinary calculi: Secondary | ICD-10-CM | POA: Diagnosis not present

## 2022-10-03 DIAGNOSIS — F32A Depression, unspecified: Secondary | ICD-10-CM | POA: Diagnosis not present

## 2022-10-03 DIAGNOSIS — G971 Other reaction to spinal and lumbar puncture: Secondary | ICD-10-CM | POA: Diagnosis not present

## 2022-10-03 NOTE — Telephone Encounter (Signed)
D/w pt.  He needs referral [facility]. Ordered.  He agreed with plan.

## 2022-10-03 NOTE — Addendum Note (Signed)
Addended by: Joaquim Nam on: 10/03/2022 12:36 PM   Modules accepted: Orders

## 2022-10-04 ENCOUNTER — Encounter: Payer: Self-pay | Admitting: *Deleted

## 2022-10-04 ENCOUNTER — Encounter: Payer: Self-pay | Admitting: Family Medicine

## 2022-10-04 NOTE — Telephone Encounter (Signed)
See phone note

## 2022-10-05 ENCOUNTER — Telehealth: Payer: Self-pay | Admitting: Family Medicine

## 2022-10-05 ENCOUNTER — Other Ambulatory Visit: Payer: Self-pay | Admitting: Family Medicine

## 2022-10-05 DIAGNOSIS — G971 Other reaction to spinal and lumbar puncture: Secondary | ICD-10-CM | POA: Diagnosis not present

## 2022-10-05 DIAGNOSIS — G8929 Other chronic pain: Secondary | ICD-10-CM

## 2022-10-05 MED ORDER — HYDROMORPHONE HCL 4 MG PO TABS
2.0000 mg | ORAL_TABLET | ORAL | 0 refills | Status: DC | PRN
Start: 2022-10-05 — End: 2022-10-18

## 2022-10-05 NOTE — Telephone Encounter (Signed)
Sherry from [employer] called stating they received a referral for pt but they don't accept the pt's insurance. Call back # [phone]

## 2022-10-05 NOTE — Telephone Encounter (Signed)
See MyChart message

## 2022-10-11 MED ORDER — ONDANSETRON 4 MG PO TBDP: 4.0000 mg | ORAL_TABLET | Freq: Four times a day (QID) | ORAL | 0 refills | Status: DC | PRN

## 2022-10-17 ENCOUNTER — Ambulatory Visit: Payer: Medicaid Other | Admitting: Family Medicine

## 2022-10-18 ENCOUNTER — Encounter: Payer: Self-pay | Admitting: Family Medicine

## 2022-10-18 ENCOUNTER — Telehealth (INDEPENDENT_AMBULATORY_CARE_PROVIDER_SITE_OTHER): Payer: Medicaid Other | Admitting: Family Medicine

## 2022-10-18 VITALS — Ht 72.0 in | Wt 300.0 lb

## 2022-10-18 DIAGNOSIS — G894 Chronic pain syndrome: Secondary | ICD-10-CM

## 2022-10-18 DIAGNOSIS — M549 Dorsalgia, unspecified: Secondary | ICD-10-CM | POA: Diagnosis not present

## 2022-10-18 DIAGNOSIS — G8929 Other chronic pain: Secondary | ICD-10-CM

## 2022-10-18 DIAGNOSIS — N200 Calculus of kidney: Secondary | ICD-10-CM

## 2022-10-18 MED ORDER — TAMSULOSIN HCL 0.4 MG PO CAPS
0.4000 mg | ORAL_CAPSULE | Freq: Every day | ORAL | Status: DC | PRN
Start: 2022-10-18 — End: 2023-05-02

## 2022-10-18 MED ORDER — ONDANSETRON 4 MG PO TBDP: 4.0000 mg | ORAL_TABLET | Freq: Four times a day (QID) | ORAL | 2 refills | Status: DC | PRN

## 2022-10-18 MED ORDER — HYDROMORPHONE HCL 4 MG PO TABS
2.0000 mg | ORAL_TABLET | ORAL | 0 refills | Status: DC | PRN
Start: 2022-10-18 — End: 2022-11-09

## 2022-10-18 MED ORDER — HYDROCODONE-ACETAMINOPHEN 5-325 MG PO TABS
1.0000 | ORAL_TABLET | Freq: Four times a day (QID) | ORAL | 0 refills | Status: DC | PRN
Start: 2022-10-18 — End: 2022-11-23

## 2022-10-18 NOTE — Progress Notes (Unsigned)
Virtual visit completed through caregility or similar program Patient location: at a card shop  Provider location: Adult nurse at [facility], office  Participants: Patient and me (unless stated otherwise below)  Limitations and rationale for visit method d/w patient.  Patient agreed to proceed.  Patient identified by 2 identifiers. If vitals are not listed, then patient was unable to self-report due to a lack of equipment at home via telehealth  CC: follow up  HPI:  He had blood patch and he improved in a few hours.  Patch was done 10/05/22.  He can send me a copy of the myelogram results.    He had increased back pain in the meantime, esp L lower back and that triggers groin pain.  Laying supine temporarily helps.  He still has sensation in his feet.    He has disability hearing pending.    He had surgery f/u with plan for surgery but the date isn't set yet, hopefully in the near future.    Weight loss is complicated by stress eating, at night.  Weight ~300 lbs. discussed.  He is still living at his sister's house.  Meds and allergies reviewed.   ROS: Per HPI unless specifically indicated in ROS section   NAD Speech wnl  A/P:  Chronic back pain, with myelogram complicated by headache that responded to blood patch.  He is going to send me a copy of his myelogram report.  He is going to follow-up with the surgery clinic to see about options.  Continue hydrocodone at baseline with hydromorphone for breakthrough pain.  Sedation caution discussed with patient.  He has disability hearing pending.  Discussed trying to manage his weight with stress eating related to recent events.  At this point he still okay for outpatient follow-up and we will try to collect labs when is feasible for the patient to come to clinic.  I will await his surgery clinic follow-up notes.  PDMP database reviewed.

## 2022-10-19 NOTE — Assessment & Plan Note (Signed)
Chronic back pain, with myelogram complicated by headache that responded to blood patch.  He is going to send me a copy of his myelogram report.  He is going to follow-up with the surgery clinic to see about options.  Continue hydrocodone at baseline with hydromorphone for breakthrough pain.  Sedation caution discussed with patient.  He has disability hearing pending.  Discussed trying to manage his weight with stress eating related to recent events.  At this point he still okay for outpatient follow-up and we will try to collect labs when is feasible for the patient to come to clinic.  I will await his surgery clinic follow-up notes.

## 2022-10-31 ENCOUNTER — Other Ambulatory Visit: Payer: Self-pay | Admitting: Family Medicine

## 2022-10-31 ENCOUNTER — Encounter: Payer: Self-pay | Admitting: Family Medicine

## 2022-10-31 NOTE — Telephone Encounter (Signed)
LOV - 10/18/22 NOV - not scheduled RF - 07/31/22 #90/0     Patient also requested in mychart message

## 2022-10-31 NOTE — Telephone Encounter (Signed)
LOV - 10/18/22 NOV - not scheduled RF - 07/31/22 #90/0

## 2022-11-01 ENCOUNTER — Encounter: Payer: Self-pay | Admitting: Family Medicine

## 2022-11-01 NOTE — Telephone Encounter (Signed)
Sent. Thanks.   

## 2022-11-02 ENCOUNTER — Encounter: Payer: Self-pay | Admitting: Family Medicine

## 2022-11-02 ENCOUNTER — Other Ambulatory Visit: Payer: Self-pay | Admitting: Family Medicine

## 2022-11-02 NOTE — Telephone Encounter (Signed)
See MyChart message and please call the pharmacy to verify/get permission for the Ambien refill.  Thanks.

## 2022-11-06 ENCOUNTER — Encounter: Payer: Self-pay | Admitting: Family Medicine

## 2022-11-09 ENCOUNTER — Telehealth: Payer: Self-pay | Admitting: Family Medicine

## 2022-11-09 ENCOUNTER — Other Ambulatory Visit: Payer: Self-pay | Admitting: Family Medicine

## 2022-11-09 DIAGNOSIS — G8929 Other chronic pain: Secondary | ICD-10-CM

## 2022-11-09 MED ORDER — HYDROMORPHONE HCL 4 MG PO TABS
2.0000 mg | ORAL_TABLET | ORAL | 0 refills | Status: DC | PRN
Start: 2022-11-09 — End: 2022-11-18

## 2022-11-09 NOTE — Telephone Encounter (Signed)
Prescription Request  11/09/2022  LOV: 05/31/2022  What is the name of the medication or equipment? HYDROmorphone (DILAUDID) 4 MG table   Have you contacted your pharmacy to request a refill? No   Which pharmacy would you like this sent to?  [facility] # [id number] [place], [place] - [address] [place] [address] Phone: [phone] Fax: [phone]     Patient notified that their request is being sent to the clinical staff for review and that they should receive a response within 2 business days.   Please advise at Mobile [phone] (mobile)

## 2022-11-10 NOTE — Telephone Encounter (Signed)
Refill sent in 11/09/22.  No further action needed at this time.

## 2022-11-17 ENCOUNTER — Encounter: Payer: Self-pay | Admitting: Family Medicine

## 2022-11-18 ENCOUNTER — Other Ambulatory Visit: Payer: Self-pay | Admitting: Family Medicine

## 2022-11-18 DIAGNOSIS — G8929 Other chronic pain: Secondary | ICD-10-CM

## 2022-11-18 NOTE — Telephone Encounter (Signed)
Name of Medication: Dilaudid  Name of Pharmacy: [facility] Last [place] or Written Date and Quantity: 11/09/22 #15 tabs/ 0 refills Last Office Visit and Type: virtual back pain appt 10/18/22 Next Office Visit and Type: none scheduled  Last Controlled Substance Agreement Date: n/a Last UDS:09/09/2020

## 2022-11-18 NOTE — Telephone Encounter (Signed)
Prescription Request  11/18/2022  LOV: 05/31/2022  What is the name of the medication or equipment? HYDROmorphone (DILAUDID) 4 MG tablet   Have you contacted your pharmacy to request a refill? No   Which pharmacy would you like this sent to?  [facility] # [address] [place], [place] - [address] [facility] [address] [place] [address] Phone: [phone] Fax: [phone]  [facility] # [address], [place] - [address] [place] [address] Phone: [phone] Fax: [phone]      Patient notified that their request is being sent to the clinical staff for review and that they should receive a response within 2 business days.   Please advise at [facility] [phone]

## 2022-11-19 ENCOUNTER — Encounter: Payer: Self-pay | Admitting: Family Medicine

## 2022-11-19 MED ORDER — HYDROMORPHONE HCL 4 MG PO TABS
2.0000 mg | ORAL_TABLET | ORAL | 0 refills | Status: DC | PRN
Start: 2022-11-19 — End: 2022-12-01

## 2022-11-19 NOTE — Telephone Encounter (Signed)
Sent. Thanks.   

## 2022-11-20 DIAGNOSIS — M48062 Spinal stenosis, lumbar region with neurogenic claudication: Secondary | ICD-10-CM | POA: Insufficient documentation

## 2022-11-22 ENCOUNTER — Encounter: Payer: Self-pay | Admitting: Family Medicine

## 2022-11-22 DIAGNOSIS — M519 Unspecified thoracic, thoracolumbar and lumbosacral intervertebral disc disorder: Secondary | ICD-10-CM | POA: Diagnosis not present

## 2022-11-22 DIAGNOSIS — M47816 Spondylosis without myelopathy or radiculopathy, lumbar region: Secondary | ICD-10-CM | POA: Diagnosis not present

## 2022-11-22 DIAGNOSIS — M48062 Spinal stenosis, lumbar region with neurogenic claudication: Secondary | ICD-10-CM | POA: Diagnosis not present

## 2022-11-23 ENCOUNTER — Other Ambulatory Visit: Payer: Self-pay | Admitting: Family Medicine

## 2022-11-23 DIAGNOSIS — G8929 Other chronic pain: Secondary | ICD-10-CM

## 2022-11-23 MED ORDER — HYDROCODONE-ACETAMINOPHEN 5-325 MG PO TABS
1.0000 | ORAL_TABLET | Freq: Four times a day (QID) | ORAL | 0 refills | Status: DC | PRN
Start: 2022-11-23 — End: 2022-12-19

## 2022-11-28 ENCOUNTER — Encounter: Payer: Self-pay | Admitting: Family Medicine

## 2022-11-30 ENCOUNTER — Encounter: Payer: Self-pay | Admitting: Family Medicine

## 2022-11-30 ENCOUNTER — Telehealth: Payer: Self-pay | Admitting: Family Medicine

## 2022-11-30 DIAGNOSIS — G8929 Other chronic pain: Secondary | ICD-10-CM

## 2022-11-30 NOTE — Telephone Encounter (Signed)
Please triage patient about his pain.  He sent a note stating he was out of morphine.  I suspect he is talking about hydromorphone but please clarify his Vicodin/hydromorphone use.  He had taken Vicodin chronically with hydromorphone for breakthrough pain.

## 2022-12-01 MED ORDER — HYDROMORPHONE HCL 4 MG PO TABS
2.0000 mg | ORAL_TABLET | ORAL | 0 refills | Status: DC | PRN
Start: 2022-12-01 — End: 2022-12-11

## 2022-12-01 NOTE — Addendum Note (Signed)
Addended by: Joaquim Nam on: 12/01/2022 10:23 AM   Modules accepted: Orders

## 2022-12-01 NOTE — Telephone Encounter (Signed)
Sent. Thanks.   

## 2022-12-01 NOTE — Addendum Note (Signed)
Addended by: Patience Musca on: 12/01/2022 09:47 AM   Modules accepted: Orders

## 2022-12-01 NOTE — Telephone Encounter (Signed)
I spoke with Casey Caldwell/; Casey Caldwell had laminectomy in [place] on 11/22/22 Casey Caldwell said that is too far for him to drive at this time and has F/U appt scheduled with Dr Para March on 12/06/22. Casey Caldwell said he is presently taking hydrocodone apap 5-325 mg taking 2 tabs q6h and that is not helping pain that much.Casey Caldwell is out of hydromorphone that was last filled # 15 on 11/19/22. Casey Caldwell request refill on hydromorphone to [facility]. Casey Caldwell said the hydromorphone is only thing that helps the pain. UC & ED precautions given and Casey Caldwell voiced understanding  sending note to Dr Para March.

## 2022-12-05 ENCOUNTER — Encounter: Payer: Self-pay | Admitting: Family Medicine

## 2022-12-05 ENCOUNTER — Ambulatory Visit: Payer: Medicaid Other | Admitting: Family Medicine

## 2022-12-05 VITALS — BP 114/70 | HR 70 | Temp 98.6°F | Ht 72.0 in | Wt 307.4 lb

## 2022-12-05 DIAGNOSIS — E119 Type 2 diabetes mellitus without complications: Secondary | ICD-10-CM

## 2022-12-05 DIAGNOSIS — G894 Chronic pain syndrome: Secondary | ICD-10-CM

## 2022-12-05 DIAGNOSIS — R739 Hyperglycemia, unspecified: Secondary | ICD-10-CM

## 2022-12-05 LAB — LIPID PANEL
Cholesterol: 200 mg/dL (ref 0–200)
HDL: 47.8 mg/dL (ref >39.00)
LDL Cholesterol: 122 mg/dL — ABNORMAL HIGH (ref 0–99)
NonHDL: 151.74
Total CHOL/HDL Ratio: 4
Triglycerides: 151 mg/dL — ABNORMAL HIGH (ref 0.0–149.0)
VLDL: 30.2 mg/dL (ref 0.0–40.0)

## 2022-12-05 LAB — CBC WITH DIFFERENTIAL/PLATELET
Basophils Absolute: 0.1 10*3/uL (ref 0.0–0.1)
Basophils Relative: 0.9 % (ref 0.0–3.0)
Eosinophils Absolute: 0.7 10*3/uL (ref 0.0–0.7)
Eosinophils Relative: 8.3 % — ABNORMAL HIGH (ref 0.0–5.0)
HCT: 50.8 % (ref 39.0–52.0)
Hemoglobin: 17 g/dL (ref 13.0–17.0)
Lymphocytes Relative: 23.9 % (ref 12.0–46.0)
Lymphs Abs: 2.2 10*3/uL (ref 0.7–4.0)
MCHC: 33.6 g/dL (ref 30.0–36.0)
MCV: 91.8 fL (ref 78.0–100.0)
Monocytes Absolute: 0.7 10*3/uL (ref 0.1–1.0)
Monocytes Relative: 7.9 % (ref 3.0–12.0)
Neutro Abs: 5.3 10*3/uL (ref 1.4–7.7)
Neutrophils Relative %: 59 % (ref 43.0–77.0)
Platelets: 366 10*3/uL (ref 150.0–400.0)
RBC: 5.54 Mil/uL (ref 4.22–5.81)
RDW: 14.8 % (ref 11.5–15.5)
WBC: 9 10*3/uL (ref 4.0–10.5)

## 2022-12-05 LAB — HEMOGLOBIN A1C: Hgb A1c MFr Bld: 5.7 % (ref 4.6–6.5)

## 2022-12-05 NOTE — Patient Instructions (Signed)
Go to the lab on the way out.   If you have mychart we'll likely use that to update you.    Take care.  Glad to see you. 

## 2022-12-05 NOTE — Progress Notes (Unsigned)
Recent back surgery d/w pt. Left-sided laminectomy foraminotomy partial medial facetectomy L4-5.  He is still having post op pain.  Scar looks normal, healing w/o discharge at time of exam.  Local itching but not redness.  Still using spinal stimulator.  No fevers.  No chills.  Appetite is normal.  Usually not constipated. Sedation caution d/w pt.    H/o DM2, w/o meds currently, recheck labs pending.   WBC elevated after surgery, recheck pending.   Meds, vitals, and allergies reviewed.   ROS: Per HPI unless specifically indicated in ROS section   Sensation intact in the BLE.  No foot drop.

## 2022-12-06 ENCOUNTER — Ambulatory Visit: Payer: Medicaid Other | Admitting: Family Medicine

## 2022-12-07 NOTE — Assessment & Plan Note (Addendum)
With recent left-sided laminectomy foraminotomy partial medial facetectomy L4-5.  Also with spinal stimulator in place.  Hopefully he will have resolution of postop pain and then notice improved back/leg pain in the meantime.  Still taking hydrocodone at baseline with hydromorphone for breakthrough pain.  Not sedated.  Still okay for outpatient follow-up.  Bowel regimen/cautions discussed with patient.  He can update me about his situation as he goes along.  White count elevation is likely reactive from surgery.  See notes on follow-up labs.

## 2022-12-07 NOTE — Assessment & Plan Note (Signed)
History of diabetes without meds currently.  See notes on follow-up labs.

## 2022-12-09 ENCOUNTER — Encounter: Payer: Self-pay | Admitting: Family Medicine

## 2022-12-09 NOTE — Telephone Encounter (Signed)
Last office visit:12/05/22 Next office visit: nothing scheduled Last refill: 12/01/2022 HYDROmorphone (DILAUDID) 4 MG tablet Qty: 15 tablets 0 refills

## 2022-12-11 ENCOUNTER — Other Ambulatory Visit: Payer: Self-pay | Admitting: Family Medicine

## 2022-12-11 DIAGNOSIS — G8929 Other chronic pain: Secondary | ICD-10-CM

## 2022-12-11 MED ORDER — HYDROMORPHONE HCL 4 MG PO TABS
2.0000 mg | ORAL_TABLET | ORAL | 0 refills | Status: DC | PRN
Start: 2022-12-11 — End: 2022-12-20

## 2022-12-14 ENCOUNTER — Telehealth: Payer: Self-pay | Admitting: Family Medicine

## 2022-12-14 NOTE — Telephone Encounter (Signed)
Patient returned call regarding lab results.I relayed the good news to him,he verbalized understanding.

## 2022-12-19 ENCOUNTER — Other Ambulatory Visit: Payer: Self-pay | Admitting: Family Medicine

## 2022-12-19 DIAGNOSIS — G8929 Other chronic pain: Secondary | ICD-10-CM

## 2022-12-19 MED ORDER — HYDROCODONE-ACETAMINOPHEN 5-325 MG PO TABS: 1.0000 | ORAL_TABLET | Freq: Four times a day (QID) | ORAL | 0 refills | Status: DC | PRN

## 2022-12-20 ENCOUNTER — Telehealth: Payer: Self-pay | Admitting: Family Medicine

## 2022-12-20 ENCOUNTER — Other Ambulatory Visit: Payer: Self-pay | Admitting: Family Medicine

## 2022-12-20 DIAGNOSIS — G8929 Other chronic pain: Secondary | ICD-10-CM

## 2022-12-20 MED ORDER — HYDROMORPHONE HCL 4 MG PO TABS: 2.0000 mg | ORAL_TABLET | ORAL | 0 refills | Status: DC | PRN

## 2022-12-20 NOTE — Telephone Encounter (Signed)
Sent. Thanks.   

## 2022-12-20 NOTE — Telephone Encounter (Signed)
Pt called stating his pharmacy won't release HYDROcodone-acetaminophen (NORCO/VICODIN) 5-325 MG tablet, due to it being too early for another rx. Pt states he was told our office would need to contact the pharmacy in order for meds to be released early, or else he cannot received meds until Sat, 11/30. Pt states he also needs a refill on HYDROmorphone (DILAUDID) 4 MG tablet. Pt mentioned he's out of both meds.  Prescription Request  12/20/2022  LOV: 12/05/2022  What is the name of the medication or equipment? HYDROmorphone (DILAUDID) 4 MG tablet   Have you contacted your pharmacy to request a refill? Yes   Which pharmacy would you like this sent to?  [facility] # [address] [place], [place] - [address] [facility] [address] [place] [address] Phone: [phone] Fax: [phone]     Patient notified that their request is being sent to the clinical staff for review and that they should receive a response within 2 business days.   Please advise at Mobile [phone] (mobile)

## 2022-12-20 NOTE — Telephone Encounter (Signed)
I spoke with the pharmacist Tresa Endo and she states that the hydromorphone is on back order and that they are not going to fill the hydrocodone any earlier than Friday. Do you want me to reach out to the patient?

## 2022-12-20 NOTE — Telephone Encounter (Signed)
I sent the hydromorphone rx.  Please contact pharmacy.  I don't know when pharmacy will release the hydrocodone rx.  He is due on the 30th.  I am okay with early fill given the holiday if they will fill it.  Thanks.

## 2022-12-20 NOTE — Telephone Encounter (Signed)
Prescription Request  12/20/2022  LOV: 12/05/2022  What is the name of the medication or equipment? HYDROmorphone (DILAUDID) 4 MG tablet   Have you contacted your pharmacy to request a refill? Yes   Which pharmacy would you like this sent to?    [facility] [id number] [place], [place] - [address] [place] [address] Phone: [phone] Fax: [phone]      Patient notified that their request is being sent to the clinical staff for review and that they should receive a response within 2 business days.   Please advise at [facility] [phone]

## 2022-12-20 NOTE — Telephone Encounter (Signed)
Spoke with patient and advised the that the hydromorphone is on back order. He will call another pharmacy to see if its available if so Dr. Para March will send an rx. As for the hydrocodone there is nothing we can do about getting it filled sooner.

## 2022-12-20 NOTE — Telephone Encounter (Signed)
See other phone note.  Thanks.

## 2022-12-20 NOTE — Telephone Encounter (Signed)
Please update patient and have him check to see if any pharmacy has hydromorphone in the meantime.  Thanks.

## 2022-12-20 NOTE — Telephone Encounter (Signed)
Refilled was sent to [facility] earlier today by Dr. Para March.  Now patient is requesting refill to go to [facility] on [facility].

## 2022-12-25 ENCOUNTER — Encounter: Payer: Self-pay | Admitting: Family Medicine

## 2022-12-27 NOTE — Progress Notes (Unsigned)
    Stuti Sandin T. Brysun Eschmann, MD, CAQ Sports Medicine [facility] at [facility] [address] [place], [address]  Phone: [phone]  FAX: [phone]  Casey Caldwell - 42 y.o. male  MRN [id number]  Date of Birth: 1980/09/21  Date: 12/28/2022  PCP: Joaquim Nam, MD  Referral: Joaquim Nam, MD  No chief complaint on file.  Subjective:   Casey Caldwell is a 42 y.o. very pleasant male patient with There is no height or weight on file to calculate BMI. who presents with the following:  He is a very pleasant young gentleman at 42, and he presents for an acute sick visit.    Review of Systems is noted in the HPI, as appropriate  Objective:   There were no vitals taken for this visit.  GEN: No acute distress; alert,appropriate. PULM: Breathing comfortably in no respiratory distress PSYCH: Normally interactive.   Laboratory and Imaging Data:  Assessment and Plan:   ***

## 2022-12-28 ENCOUNTER — Encounter: Payer: Self-pay | Admitting: Family Medicine

## 2022-12-28 ENCOUNTER — Ambulatory Visit (INDEPENDENT_AMBULATORY_CARE_PROVIDER_SITE_OTHER): Payer: Medicaid Other | Admitting: Family Medicine

## 2022-12-28 VITALS — BP 112/84 | HR 67 | Temp 98.2°F | Ht 72.0 in | Wt 306.0 lb

## 2022-12-28 DIAGNOSIS — L03312 Cellulitis of back [any part except buttock]: Secondary | ICD-10-CM

## 2022-12-28 DIAGNOSIS — R051 Acute cough: Secondary | ICD-10-CM

## 2022-12-28 DIAGNOSIS — J069 Acute upper respiratory infection, unspecified: Secondary | ICD-10-CM | POA: Diagnosis not present

## 2022-12-28 LAB — POC INFLUENZA A&B (BINAX/QUICKVUE)
Influenza A, POC: NEGATIVE
Influenza B, POC: NEGATIVE

## 2022-12-28 LAB — POC COVID19 BINAXNOW: SARS Coronavirus 2 Ag: NEGATIVE

## 2022-12-28 MED ORDER — CEPHALEXIN 500 MG PO CAPS: 1000.0000 mg | ORAL_CAPSULE | Freq: Two times a day (BID) | ORAL | 0 refills | Status: DC

## 2023-01-01 ENCOUNTER — Encounter: Payer: Self-pay | Admitting: Family Medicine

## 2023-01-02 NOTE — Telephone Encounter (Signed)
Please submit PA if needed for Hydromorphone and Hydrocodone.

## 2023-01-02 NOTE — Telephone Encounter (Signed)
Last refilled 12/20/22 for 15 tablets  Message has been sent today to PA team regarding PA needed for pain meds. Message did not appear to be addressed last week when sent.

## 2023-01-03 ENCOUNTER — Encounter: Payer: Self-pay | Admitting: Family Medicine

## 2023-01-03 DIAGNOSIS — Z888 Allergy status to other drugs, medicaments and biological substances status: Secondary | ICD-10-CM | POA: Diagnosis not present

## 2023-01-03 DIAGNOSIS — M549 Dorsalgia, unspecified: Secondary | ICD-10-CM | POA: Diagnosis not present

## 2023-01-03 DIAGNOSIS — Z87442 Personal history of urinary calculi: Secondary | ICD-10-CM | POA: Diagnosis not present

## 2023-01-03 DIAGNOSIS — Z556 Problems related to health literacy: Secondary | ICD-10-CM | POA: Diagnosis not present

## 2023-01-03 DIAGNOSIS — L03312 Cellulitis of back [any part except buttock]: Secondary | ICD-10-CM | POA: Diagnosis not present

## 2023-01-03 DIAGNOSIS — G8929 Other chronic pain: Secondary | ICD-10-CM | POA: Diagnosis not present

## 2023-01-03 DIAGNOSIS — K219 Gastro-esophageal reflux disease without esophagitis: Secondary | ICD-10-CM | POA: Diagnosis not present

## 2023-01-03 DIAGNOSIS — Z886 Allergy status to analgesic agent status: Secondary | ICD-10-CM | POA: Diagnosis not present

## 2023-01-03 DIAGNOSIS — Z885 Allergy status to narcotic agent status: Secondary | ICD-10-CM | POA: Diagnosis not present

## 2023-01-03 DIAGNOSIS — F32A Depression, unspecified: Secondary | ICD-10-CM | POA: Diagnosis not present

## 2023-01-03 DIAGNOSIS — Z79899 Other long term (current) drug therapy: Secondary | ICD-10-CM | POA: Diagnosis not present

## 2023-01-04 ENCOUNTER — Telehealth: Payer: Self-pay

## 2023-01-04 ENCOUNTER — Other Ambulatory Visit: Payer: Self-pay | Admitting: Family Medicine

## 2023-01-04 ENCOUNTER — Other Ambulatory Visit (HOSPITAL_COMMUNITY): Payer: Self-pay

## 2023-01-04 DIAGNOSIS — N4889 Other specified disorders of penis: Secondary | ICD-10-CM | POA: Diagnosis not present

## 2023-01-04 DIAGNOSIS — Z9049 Acquired absence of other specified parts of digestive tract: Secondary | ICD-10-CM | POA: Diagnosis not present

## 2023-01-04 DIAGNOSIS — N132 Hydronephrosis with renal and ureteral calculous obstruction: Secondary | ICD-10-CM | POA: Diagnosis not present

## 2023-01-04 DIAGNOSIS — N134 Hydroureter: Secondary | ICD-10-CM | POA: Diagnosis not present

## 2023-01-04 DIAGNOSIS — R109 Unspecified abdominal pain: Secondary | ICD-10-CM | POA: Diagnosis not present

## 2023-01-04 DIAGNOSIS — N201 Calculus of ureter: Secondary | ICD-10-CM | POA: Diagnosis not present

## 2023-01-04 DIAGNOSIS — G8929 Other chronic pain: Secondary | ICD-10-CM

## 2023-01-04 MED ORDER — HYDROMORPHONE HCL 4 MG PO TABS: 2.0000 mg | ORAL_TABLET | ORAL | 0 refills | Status: DC | PRN

## 2023-01-04 NOTE — Telephone Encounter (Signed)
Pharmacy Patient Advocate Encounter   Received notification from Patient Advice Request messages that prior authorization for Hydromorphone 4mg  is required/requested.   Insurance verification completed.   The patient is insured through [facility] .   Per test claim: PA required; PA submitted to above mentioned insurance via Fax Key/confirmation #/EOC --- Status is pending

## 2023-01-04 NOTE — Telephone Encounter (Signed)
Pharmacy Patient Advocate Encounter   Received notification from Patient Advice Request messages that prior authorization for HYDROcodone-Acetaminophen 5-325MG  tablets is required/requested.   Insurance verification completed.   The patient is insured through [facility] .   Per test claim: PA required; PA submitted to above mentioned insurance via Fax Key/confirmation #/EOC --- Status is pending

## 2023-01-05 ENCOUNTER — Telehealth: Payer: Self-pay | Admitting: Family Medicine

## 2023-01-05 DIAGNOSIS — G8929 Other chronic pain: Secondary | ICD-10-CM

## 2023-01-05 DIAGNOSIS — Z9889 Other specified postprocedural states: Secondary | ICD-10-CM | POA: Diagnosis not present

## 2023-01-05 MED ORDER — HYDROMORPHONE HCL 4 MG PO TABS
2.0000 mg | ORAL_TABLET | ORAL | 0 refills | Status: DC | PRN
Start: 2023-01-05 — End: 2023-01-13

## 2023-01-05 NOTE — Telephone Encounter (Signed)
Addendum- I see the notification from [employer] that patient will have to use [facility].  Please cancel the rx at [facility] and notify pt.   Thanks.

## 2023-01-05 NOTE — Telephone Encounter (Signed)
Pharmacy Patient Advocate Encounter  Received notification from [facility] that Prior Authorization for HYDROcodone-Acetaminophen 5-325MG   has been APPROVED from 01/04/23 to 07/03/23

## 2023-01-05 NOTE — Telephone Encounter (Signed)
Patient called in and stated that his HYDROmorphone (DILAUDID) 4 MG tablet should of been sent over to  [facility] # [address] [place], [place] - [address] [place]  He stated that it was sent over to [facility] instead.

## 2023-01-05 NOTE — Telephone Encounter (Signed)
I resent it to [facility].  Please cancel the rx at [facility] [id number] - [place], [place] - [address]  Thanks.

## 2023-01-05 NOTE — Telephone Encounter (Signed)
Spoke with patient he is aware that all medications such as opiods/narcotics/control substances have to go to [facility].

## 2023-01-06 ENCOUNTER — Other Ambulatory Visit (HOSPITAL_COMMUNITY): Payer: Self-pay

## 2023-01-09 ENCOUNTER — Encounter: Payer: Self-pay | Admitting: Family Medicine

## 2023-01-10 ENCOUNTER — Other Ambulatory Visit (HOSPITAL_COMMUNITY): Payer: Self-pay

## 2023-01-10 NOTE — Telephone Encounter (Signed)
Called insurance to check status of PA. Per representative, patient is locked in at [id number] [phone] [place], [place], [place]). Representative ran test claim and received paid claim. No additional PA required. Patient has to fill at [id number].

## 2023-01-10 NOTE — Telephone Encounter (Signed)
FYI

## 2023-01-11 NOTE — Telephone Encounter (Signed)
Thank you for working on this.  My understanding is that he doesn't need rx sent at this point.

## 2023-01-12 NOTE — Telephone Encounter (Signed)
Are there any outstanding issues on this prior authorization?  Please let me know if there is anything I need to address.  Thanks.

## 2023-01-13 ENCOUNTER — Ambulatory Visit (INDEPENDENT_AMBULATORY_CARE_PROVIDER_SITE_OTHER): Payer: Medicaid Other | Admitting: Family Medicine

## 2023-01-13 ENCOUNTER — Encounter: Payer: Self-pay | Admitting: Family Medicine

## 2023-01-13 VITALS — BP 106/68 | HR 67 | Temp 98.2°F | Ht 72.0 in | Wt 307.2 lb

## 2023-01-13 DIAGNOSIS — M549 Dorsalgia, unspecified: Secondary | ICD-10-CM

## 2023-01-13 DIAGNOSIS — Z59819 Housing instability, housed unspecified: Secondary | ICD-10-CM

## 2023-01-13 DIAGNOSIS — G8929 Other chronic pain: Secondary | ICD-10-CM

## 2023-01-13 DIAGNOSIS — G894 Chronic pain syndrome: Secondary | ICD-10-CM

## 2023-01-13 MED ORDER — HYDROCODONE-ACETAMINOPHEN 5-325 MG PO TABS: 1.0000 | ORAL_TABLET | Freq: Four times a day (QID) | ORAL | 0 refills | Status: DC | PRN

## 2023-01-13 MED ORDER — HYDROMORPHONE HCL 4 MG PO TABS: 2.0000 mg | ORAL_TABLET | ORAL | 0 refills | Status: DC | PRN

## 2023-01-13 NOTE — Progress Notes (Unsigned)
Follow up re: back.  He is using [employer] in [place]- he got that approved.    Not sedated. Still on hydrocodone with hydromorphone for breakthrough pain.  D/w pt about follow up back injection for pain through surgery clinic.  Not constipated.    Still with L sided groin pain.  He has pain at the implant site and has eval pending for that.  He is going to start PT in 01/2023.    He had ER eval for rash and renal stones.  He passed the stone in the meantime.    Down ~90 lbs from last year.  Diet affect by housing.    He is living with his sister, who sold the house that she was living in.  He is applying for housing.  He is approved for disability by his report.    His mood is better in the meantime.    Meds, vitals, and allergies reviewed.   ROS: Per HPI unless specifically indicated in ROS section       Lower back hypersensitive but healed scars.  No rash.   L lateral thigh hypersensitive.   Patch of hypersensitive skin on the R side of the abd, not across the midline.    Stimulator eval Injection PT.

## 2023-01-13 NOTE — Patient Instructions (Addendum)
Let me know if you don't hear about housing soon.  Take care.  Glad to see you. Update me next month about your back and situation overall.

## 2023-01-15 NOTE — Assessment & Plan Note (Signed)
Not sedated and okay for outpatient follow-up.  Continue hydrocortisone with hydromorphone for breakthrough pain. There are multiple items that could improve his situation, and they are in the midst of being addressed. He has spinal stimulator evaluation pending.  Hopefully he will be able to have adjustment to that to improve his pain control. He is going to follow-up with the surgery clinic regarding back injection to see if that helps with pain.   He is going to follow-up with PT.  Discussed his housing situation, social work referral placed.  He is applying for housing.  He is on disability.  At this point he still okay for outpatient follow-up.  He agrees with plan.

## 2023-01-17 ENCOUNTER — Other Ambulatory Visit: Payer: Self-pay | Admitting: Family Medicine

## 2023-01-17 ENCOUNTER — Other Ambulatory Visit (HOSPITAL_COMMUNITY): Payer: Self-pay

## 2023-01-18 DIAGNOSIS — R0602 Shortness of breath: Secondary | ICD-10-CM | POA: Diagnosis not present

## 2023-01-18 DIAGNOSIS — R0789 Other chest pain: Secondary | ICD-10-CM | POA: Diagnosis not present

## 2023-01-18 DIAGNOSIS — Z9884 Bariatric surgery status: Secondary | ICD-10-CM | POA: Diagnosis not present

## 2023-01-18 DIAGNOSIS — R1084 Generalized abdominal pain: Secondary | ICD-10-CM | POA: Diagnosis not present

## 2023-01-18 DIAGNOSIS — G8929 Other chronic pain: Secondary | ICD-10-CM | POA: Diagnosis not present

## 2023-01-18 DIAGNOSIS — R9431 Abnormal electrocardiogram [ECG] [EKG]: Secondary | ICD-10-CM | POA: Diagnosis not present

## 2023-01-18 DIAGNOSIS — Z609 Problem related to social environment, unspecified: Secondary | ICD-10-CM | POA: Diagnosis not present

## 2023-01-18 DIAGNOSIS — Z9049 Acquired absence of other specified parts of digestive tract: Secondary | ICD-10-CM | POA: Diagnosis not present

## 2023-01-18 DIAGNOSIS — K921 Melena: Secondary | ICD-10-CM | POA: Diagnosis not present

## 2023-01-18 DIAGNOSIS — K5730 Diverticulosis of large intestine without perforation or abscess without bleeding: Secondary | ICD-10-CM | POA: Diagnosis not present

## 2023-01-18 DIAGNOSIS — N200 Calculus of kidney: Secondary | ICD-10-CM | POA: Diagnosis not present

## 2023-01-18 DIAGNOSIS — K429 Umbilical hernia without obstruction or gangrene: Secondary | ICD-10-CM | POA: Diagnosis not present

## 2023-01-19 ENCOUNTER — Telehealth: Payer: Self-pay

## 2023-01-19 NOTE — Progress Notes (Signed)
Complex Care Management Note  Care Guide Note 01/19/2023 Name: Casey Caldwell MRN: [id number] DOB: 1980-06-01  Casey Caldwell is a 42 y.o. year old male who sees Joaquim Nam, MD for primary care. I reached out to Hector Brunswick by phone today to offer complex care management services.  Casey Caldwell was given information about Complex Care Management services today including:   The Complex Care Management services include support from the care team which includes your Nurse Coordinator, Clinical Social Worker, or Pharmacist.  The Complex Care Management team is here to help remove barriers to the health concerns and goals most important to you. Complex Care Management services are voluntary, and the patient may decline or stop services at any time by request to their care team member.   Complex Care Management Consent Status: Patient agreed to services and verbal consent obtained.   Follow up plan:  Telephone appointment with complex care management team member scheduled for:  01/24/2023  Encounter Outcome:  Patient Scheduled  Penne Lash , RMA     [place]  [facility] Guide  Direct Dial: [phone]  Website: [email or web address]

## 2023-01-19 NOTE — Telephone Encounter (Signed)
Sent. Thanks.   

## 2023-01-20 NOTE — Telephone Encounter (Signed)
Prev sent ambien rx.

## 2023-01-23 ENCOUNTER — Telehealth: Payer: Self-pay | Admitting: *Deleted

## 2023-01-23 NOTE — Telephone Encounter (Signed)
Noted. Thanks.

## 2023-01-23 NOTE — Telephone Encounter (Signed)
I spoke with Liza at [facility] and [place] needed to know for a routine yearly form  the last time pt was seen by Dr Para March and had pt had drug screen in last year. I told Warden Fillers pt last seen by Dr Para March on 01/13/2023 and I did not see drug screen under lab tab in last year. Warden Fillers said that was fine and she will complete yearly form,. Sending FYI only to Dr Para March

## 2023-01-23 NOTE — Telephone Encounter (Signed)
Copied from CRM [id number]. Topic: Clinical - Medication Question >> Jan 23, 2023  3:06 PM Presley Raddle C wrote: Reason for CRM: Warden Fillers, Pharmacist at [facility], called and stated that they need Dr. Para March or an RN to call them back to answer a few questions regarding the pt's medication, Hydrocodone. She explained that this is done every year since the pt's dose for the medication is a higher quantity than usual. Please call and advise at [phone].

## 2023-01-24 ENCOUNTER — Other Ambulatory Visit: Payer: Self-pay
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
## 2023-01-24 NOTE — Patient Instructions (Signed)
 Visit Information  Casey Caldwell was given information about Medicaid Managed Care team care coordination services as a part of their Healthy [facility] Medicaid benefit. Casey Caldwell verbally consented to engagement with the [facility] Managed Care team.   If you are experiencing a medical emergency, please call 911 or report to your local emergency department or urgent care.   If you have a non-emergency medical problem during routine business hours, please contact your provider's office and ask to speak with a nurse.   For questions related to your Healthy [facility] health plan, please call: [phone] or visit the homepage here: [email or web address]  If you would like to schedule transportation through your Healthy [facility] plan, please call the following number at least 2 days in advance of your appointment: [phone]  For information about your ride after you set it up, call Ride Assist at [phone]. Use this number to activate a Will Call pickup, or if your transportation is late for a scheduled pickup. Use this number, too, if you need to make a change or cancel a previously scheduled reservation.  If you need transportation services right away, call [phone]. The after-hours call center is staffed 24 hours to handle ride assistance and urgent reservation requests (including discharges) 365 days a year. Urgent trips include sick visits, hospital discharge requests and life-sustaining treatment.  Call the [facility] Line at [phone], at any time, 24 hours a day, 7 days a week. If you are in danger or need immediate medical attention call 911.  If you would like help to quit smoking, call 1-800-QUIT-NOW ((603)331-7105) OR Espaol: 1-855-Djelo-Ya (8-144-664-6430) o para ms informacin haga clic aqu or Text READY to 799-599 to register via text  [name] - following are the goals we discussed in your visit today:   Goals  Addressed   None      Social Worker will follow up in 30 days.   [name], [name], [name] [facility] Health  Managed Medicaid Social Worker [phone]   Following is a copy of your plan of care:  There are no care plans that you recently modified to display for this patient.

## 2023-01-30 ENCOUNTER — Telehealth (INDEPENDENT_AMBULATORY_CARE_PROVIDER_SITE_OTHER): Payer: Medicaid Other | Admitting: Family Medicine

## 2023-01-30 ENCOUNTER — Telehealth: Payer: Self-pay

## 2023-01-30 VITALS — Ht 72.0 in | Wt 300.0 lb

## 2023-01-30 DIAGNOSIS — G4700 Insomnia, unspecified: Secondary | ICD-10-CM

## 2023-01-30 DIAGNOSIS — R0789 Other chest pain: Secondary | ICD-10-CM

## 2023-01-30 DIAGNOSIS — M549 Dorsalgia, unspecified: Secondary | ICD-10-CM | POA: Diagnosis not present

## 2023-01-30 DIAGNOSIS — G8929 Other chronic pain: Secondary | ICD-10-CM

## 2023-01-30 MED ORDER — HYDROMORPHONE HCL 4 MG PO TABS: 2.0000 mg | ORAL_TABLET | ORAL | 0 refills | Status: DC | PRN

## 2023-01-30 NOTE — Telephone Encounter (Signed)
 Spoke with Selena Batten at pharmacy and advised of Dr. Armanda Heritage notes
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
## 2023-01-30 NOTE — Telephone Encounter (Signed)
 Please give this update:   He recently had surgery for longstanding back pain with spinal stimulator in place.  He has been taking hydrocodone  at baseline with hydromorphone  for breakthrough pain w/o sedation.    The goal is for him to start PT and potentially have one more back surgery to address his back pain and then gradually wean his opiate rx.    Longstanding h/o insomnia controlled with ambien  w/o sedation.    M54.9.  G89.29.  G47.00  I thank her for the call.

## 2023-01-30 NOTE — Telephone Encounter (Signed)
 Copied from CRM [id number]. Topic: Clinical - Prescription Issue >> Jan 30, 2023 12:51 PM Viola F wrote: Reason for CRM: Luke from [facility] called requesting a call back asap - Patient is currently taking Hydrocodone , Hydromorhpine & Ambien  - these meds combined is at risk for depression. Please call her back with diagnosis codes at [phone] , ask to speak to [facility].

## 2023-01-31 DIAGNOSIS — M6281 Muscle weakness (generalized): Secondary | ICD-10-CM | POA: Diagnosis not present

## 2023-01-31 DIAGNOSIS — M545 Low back pain, unspecified: Secondary | ICD-10-CM | POA: Diagnosis not present

## 2023-01-31 DIAGNOSIS — Z9889 Other specified postprocedural states: Secondary | ICD-10-CM | POA: Diagnosis not present

## 2023-02-01 DIAGNOSIS — R0789 Other chest pain: Secondary | ICD-10-CM | POA: Insufficient documentation

## 2023-02-01 NOTE — Assessment & Plan Note (Signed)
  Longstanding history of insomnia, Ambien rx prev sent, d/w pt.
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
## 2023-02-01 NOTE — Assessment & Plan Note (Signed)
  Separate issues likely with pleurisy, discussed with patient.  He has improved in the meantime.  He is going to update me as needed.

## 2023-02-06 ENCOUNTER — Encounter: Payer: Self-pay | Admitting: Family Medicine

## 2023-02-07 ENCOUNTER — Other Ambulatory Visit (HOSPITAL_COMMUNITY): Payer: Self-pay

## 2023-02-10 NOTE — Telephone Encounter (Unsigned)
Copied from CRM [id number]. Topic: Clinical - Medication Refill >> Feb 10, 2023 11:17 AM Theodis Sato wrote: Most Recent Primary Care Visit:  Provider: Joaquim Nam  Department: [facility]  Visit Type: MYCHART VIDEO VISIT  Date: 01/30/2023  Medication: HYDROmorphone (DILAUDID) 4 MG tablet  Has the patient contacted their pharmacy? Yes (Agent: If no, request that the patient contact the pharmacy for the refill. If patient does not wish to contact the pharmacy document the reason why and proceed with request.) (Agent: If yes, when and what did the pharmacy advise?)  Is this the correct pharmacy for this prescription? Yes If no, delete pharmacy and type the correct one.  This is the patient's preferred pharmacy:  [facility] [id number] [place], [place] - [address] [facility] [address] [place] [address] Phone: [phone] Fax: [phone]   Has the prescription been filled recently? Yes  Is the patient out of the medication? Yes  Has the patient been seen for an appointment in the last year OR does the patient have an upcoming appointment?  Yes just had an appointment on 1/6  Can we respond through MyChart? Yes  Agent: Please be advised that Rx refills may take up to 3 business days. We ask that you follow-up with your pharmacy.

## 2023-02-15 ENCOUNTER — Encounter: Payer: Self-pay | Admitting: Family Medicine

## 2023-02-15 ENCOUNTER — Other Ambulatory Visit: Payer: Self-pay | Admitting: Family Medicine

## 2023-02-15 DIAGNOSIS — Z59819 Housing instability, housed unspecified: Secondary | ICD-10-CM

## 2023-02-15 DIAGNOSIS — G8929 Other chronic pain: Secondary | ICD-10-CM

## 2023-02-15 MED ORDER — HYDROCODONE-ACETAMINOPHEN 5-325 MG PO TABS: 1.0000 | ORAL_TABLET | Freq: Four times a day (QID) | ORAL | 0 refills | Status: DC | PRN

## 2023-02-15 MED ORDER — HYDROMORPHONE HCL 4 MG PO TABS: 2.0000 mg | ORAL_TABLET | ORAL | 0 refills | Status: DC | PRN

## 2023-02-18 ENCOUNTER — Telehealth: Payer: Self-pay | Admitting: Family Medicine

## 2023-02-18 NOTE — Telephone Encounter (Signed)
Please see mychart message and see if a lighter copy of the HUD form can get entered.    Or see if you can send the original to the patient.    The other option is to get a lighter copy and get it resigned.  I am out of clinic.  I can sign it when I get back, if another doc can't sign it in the meantime.    This patient clearly qualifies for housing.   Thanks .

## 2023-02-20 NOTE — Telephone Encounter (Signed)
Spoke with patient he is going to see if [employer] will fax over the form. If not then he will come by and drop off a clear copy

## 2023-02-22 NOTE — Telephone Encounter (Signed)
Noted. Thanks.

## 2023-02-24 ENCOUNTER — Other Ambulatory Visit: Payer: Self-pay

## 2023-02-24 NOTE — Patient Instructions (Signed)
Visit Information  Mr. Casey Caldwell was given information about Medicaid Managed Care team care coordination services as a part of their Healthy [facility] Medicaid benefit. Casey Caldwell verbally consented to engagement with the [facility] Managed Care team.   If you are experiencing a medical emergency, please call 911 or report to your local emergency department or urgent care.   If you have a non-emergency medical problem during routine business hours, please contact your provider's office and ask to speak with a nurse.   For questions related to your Healthy [facility] health plan, please call: [phone] or visit the homepage here: [email or web address]  If you would like to schedule transportation through your Healthy [facility] plan, please call the following number at least 2 days in advance of your appointment: [phone]  For information about your ride after you set it up, call Ride Assist at [phone]. Use this number to activate a Will Call pickup, or if your transportation is late for a scheduled pickup. Use this number, too, if you need to make a change or cancel a previously scheduled reservation.  If you need transportation services right away, call [phone]. The after-hours call center is staffed 24 hours to handle ride assistance and urgent reservation requests (including discharges) 365 days a year. Urgent trips include sick visits, hospital discharge requests and life-sustaining treatment.  Call the [facility] Line at [phone], at any time, 24 hours a day, 7 days a week. If you are in danger or need immediate medical attention call 911.  If you would like help to quit smoking, call 1-800-QUIT-NOW ((647)851-7266) OR Espaol: 1-855-Djelo-Ya (4-742-595-6387) o para ms informacin haga clic aqu or Text READY to 564-332 to register via text  Casey Caldwell - following are the goals we discussed in your visit today:   Goals  Addressed   None       The  Patient                                              has been provided with contact information for the Managed Medicaid care management team and has been advised to call with any health related questions or concerns.  Gus Puma, Kenard Gower, [name] [facility] Health  Managed Medicaid Social Worker [phone]   Following is a copy of your plan of care:  There are no care plans that you recently modified to display for this patient.
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
## 2023-03-01 ENCOUNTER — Encounter: Payer: Self-pay | Admitting: Family Medicine

## 2023-03-02 ENCOUNTER — Other Ambulatory Visit: Payer: Self-pay | Admitting: Family Medicine

## 2023-03-02 DIAGNOSIS — G8929 Other chronic pain: Secondary | ICD-10-CM

## 2023-03-02 MED ORDER — HYDROMORPHONE HCL 4 MG PO TABS: 2.0000 mg | ORAL_TABLET | ORAL | 0 refills | Status: DC | PRN

## 2023-03-07 ENCOUNTER — Telehealth (HOSPITAL_COMMUNITY): Payer: Self-pay | Admitting: Pharmacy Technician

## 2023-03-07 ENCOUNTER — Other Ambulatory Visit (HOSPITAL_COMMUNITY): Payer: Self-pay

## 2023-03-07 NOTE — Telephone Encounter (Signed)
Pharmacy Patient Advocate Encounter  Received notification from [facility] that Prior Authorization for HYDROMORPHONE 4MG  TABLETS has been APPROVED from 03/07/2023 to 09/03/2023.   PA #/Case ID/Reference #: [id number]  Unable to obtain price due to patient being locked into a specific pharmacy.

## 2023-03-07 NOTE — Telephone Encounter (Signed)
Pharmacy Patient Advocate Encounter   Received notification from CoverMyMeds that prior authorization for HYDROMORPHONE 4MG  is required/requested.   Insurance verification completed.   The patient is insured through [facility] .   Per test claim: PA required; PA submitted to above mentioned insurance via CoverMyMeds Key/confirmation #/EOC [id number] Status is pending

## 2023-03-10 ENCOUNTER — Encounter: Payer: Self-pay | Admitting: Family Medicine

## 2023-03-13 ENCOUNTER — Other Ambulatory Visit: Payer: Self-pay | Admitting: Family Medicine

## 2023-03-13 DIAGNOSIS — G8929 Other chronic pain: Secondary | ICD-10-CM

## 2023-03-13 MED ORDER — HYDROMORPHONE HCL 4 MG PO TABS: 2.0000 mg | ORAL_TABLET | ORAL | 0 refills | Status: DC | PRN

## 2023-03-13 MED ORDER — HYDROCODONE-ACETAMINOPHEN 5-325 MG PO TABS: 1.0000 | ORAL_TABLET | Freq: Four times a day (QID) | ORAL | 0 refills | Status: DC | PRN

## 2023-03-24 ENCOUNTER — Encounter: Payer: Self-pay | Admitting: Family Medicine

## 2023-03-24 DIAGNOSIS — G8929 Other chronic pain: Secondary | ICD-10-CM

## 2023-03-26 ENCOUNTER — Other Ambulatory Visit: Payer: Self-pay | Admitting: Family Medicine

## 2023-03-26 DIAGNOSIS — N200 Calculus of kidney: Secondary | ICD-10-CM

## 2023-03-26 DIAGNOSIS — G8929 Other chronic pain: Secondary | ICD-10-CM

## 2023-03-26 MED ORDER — ALLOPURINOL 300 MG PO TABS: 300.0000 mg | ORAL_TABLET | Freq: Every day | ORAL | 1 refills | Status: DC

## 2023-03-26 MED ORDER — ARIPIPRAZOLE 5 MG PO TABS: 2.5000 mg | ORAL_TABLET | Freq: Every day | ORAL | 1 refills | Status: DC

## 2023-03-26 MED ORDER — BUSPIRONE HCL 5 MG PO TABS: 5.0000 mg | ORAL_TABLET | Freq: Two times a day (BID) | ORAL | 1 refills | Status: DC

## 2023-03-26 MED ORDER — ESCITALOPRAM OXALATE 20 MG PO TABS: 20.0000 mg | ORAL_TABLET | Freq: Every day | ORAL | 1 refills | Status: DC

## 2023-03-26 MED ORDER — HYDROMORPHONE HCL 4 MG PO TABS: 2.0000 mg | ORAL_TABLET | ORAL | 0 refills | Status: DC | PRN

## 2023-03-31 ENCOUNTER — Telehealth: Payer: Self-pay

## 2023-03-31 ENCOUNTER — Other Ambulatory Visit (HOSPITAL_COMMUNITY): Payer: Self-pay

## 2023-03-31 NOTE — Telephone Encounter (Signed)
 Pharmacy Patient Advocate Encounter  Received notification from [facility] that Prior Authorization for ARIPiprazole 5MG  tablets has been APPROVED from 03/31/23 to 03/30/24. Ran test claim, Copay is $4. This test claim was processed through [facility]- copay amounts may vary at other pharmacies due to pharmacy/plan contracts, or as the patient moves through the different stages of their insurance plan.   PA #/Case ID/Reference #: [id number]

## 2023-04-03 ENCOUNTER — Telehealth: Payer: Self-pay | Admitting: Family Medicine

## 2023-04-03 NOTE — Telephone Encounter (Signed)
 Copied from CRM [id number]. Topic: Clinical - Medication Question >> Apr 03, 2023  9:38 AM Kathryne Eriksson wrote: Reason for CRM: Requesting A Call Back >> Apr 03, 2023  9:41 AM Kathryne Eriksson wrote: Patient is calling requesting a call back to speak with Joaquim Nam, MD on behalf of his pain medication. Patient is also wanting to switch his upcoming appointment to telephone visit, due to him leaving out of state but wasn't given the option. Patient call back number [phone]

## 2023-04-04 ENCOUNTER — Encounter: Payer: Self-pay | Admitting: Family Medicine

## 2023-04-05 NOTE — Telephone Encounter (Signed)
 Called pt and LMOVM.  Will send mychart message.   Okay for upcoming visit to be a video visit.  Thanks.

## 2023-04-05 NOTE — Telephone Encounter (Signed)
 Casey Caldwell

## 2023-04-05 NOTE — Telephone Encounter (Signed)
Sending note to [employer].

## 2023-04-05 NOTE — Telephone Encounter (Signed)
 Patient notified

## 2023-04-07 ENCOUNTER — Telehealth: Admitting: Family Medicine

## 2023-04-07 VITALS — Ht 72.0 in

## 2023-04-07 DIAGNOSIS — R739 Hyperglycemia, unspecified: Secondary | ICD-10-CM

## 2023-04-07 DIAGNOSIS — M549 Dorsalgia, unspecified: Secondary | ICD-10-CM | POA: Diagnosis not present

## 2023-04-07 DIAGNOSIS — G8929 Other chronic pain: Secondary | ICD-10-CM

## 2023-04-07 NOTE — Progress Notes (Signed)
 Virtual visit completed through caregility or similar program Patient location: in his car, not driving Provider location: Adult nurse at [facility], office  Participants: Patient and me (unless stated otherwise below)  Limitations and rationale for visit method d/w patient.  Patient agreed to proceed.  Patient identified by 2 identifiers. If vitals are not listed, then patient was unable to self-report due to a lack of equipment at home via telehealth  CC: follow up.   HPI:  Discussed his housing situation, living with a friend currently.  He is awaiting HUD/disability housing to come through.  He hopes to hear in the near future.   He called his insurance company about coverage re: possible surgery for his back/stimulator.  Per patient report, this was delayed as the insurance company is awaiting an update from his Passenger transport manager.  The information has been requested from the device company in the meantime.  He last talked with the surgeon last week.  He goal is to get his dorsal column stimulator battery removed, in the hopes that will help his pain.    He has pain in the L lower back, at the stimulator site and L groin.  Pain isn't going down the L leg past the groin.   He is still dealing with constant pain, on bowel regimen.  He is still having to take hydrocodone multiple times per day with hydromorphone for breakthrough pain.   Meds and allergies reviewed.   ROS: Per HPI unless specifically indicated in ROS section   NAD Speech wnl  A/P:  Chronic pain.  D/w pt about options. I need to check with his surgeon to see about options in the meantime.  I don't know if he would be a candidate for any procedure other than surgery.  He is having to take opiates multiple times per day and is having breakthrough pain.  Discussed with him about potentially changing him to a longer acting opiate to decrease his number of doses per day but not increase his overall morphine equivalent dose.  He  can price check MS CONTIN 15mg  4 per day which would be the equivalent of his current dose of hydrocodone plus breakthrough hydromorphone.  He is due for follow-up labs and I will check on getting that set up for 3/19.  See following phone note.  At this point no change in medications and we will update the patient when we have more information.

## 2023-04-09 ENCOUNTER — Telehealth: Payer: Self-pay | Admitting: Family Medicine

## 2023-04-09 NOTE — Telephone Encounter (Signed)
 Please check with the lab at [facility].  I put in orders for future UDS and A1c.  Does he need an appointment to get labs done there or can he walk in?  Please let patient know.  Thanks.

## 2023-04-09 NOTE — Assessment & Plan Note (Signed)
 Chronic pain.  D/w pt about options. I need to check with his surgeon to see about options in the meantime.  I don't know if he would be a candidate for any procedure other than surgery.  He is having to take opiates multiple times per day and is having breakthrough pain.  Discussed with him about potentially changing him to a longer acting opiate to decrease his number of doses per day but not increase his overall morphine equivalent dose.  He can price check MS CONTIN 15mg  4 per day which would be the equivalent of his current dose of hydrocodone plus breakthrough hydromorphone.  He is due for follow-up labs and I will check on getting that set up for 3/19.  See following phone note.  At this point no change in medications and we will update the patient when we have more information.

## 2023-04-10 ENCOUNTER — Encounter: Payer: Self-pay | Admitting: Family Medicine

## 2023-04-11 ENCOUNTER — Telehealth: Payer: Self-pay | Admitting: Family Medicine

## 2023-04-11 ENCOUNTER — Encounter: Payer: Self-pay | Admitting: Family Medicine

## 2023-04-11 NOTE — Telephone Encounter (Signed)
 This is the location where he wants the medication you all discussed sent to. He is aware that he can go to the lab at [facility] without having to make an appointment

## 2023-04-11 NOTE — Telephone Encounter (Signed)
 Called and left message with staff for Dr. Salvatore Marvel.  [address]  [place], [place] [address]  [phone] (Work)   Asking for input about patient's case.

## 2023-04-11 NOTE — Telephone Encounter (Signed)
 Patient notified that he can go to the lab at [facility] without having to make an appointment.

## 2023-04-11 NOTE — Telephone Encounter (Signed)
 FYI

## 2023-04-12 ENCOUNTER — Other Ambulatory Visit: Payer: Self-pay | Admitting: Family Medicine

## 2023-04-12 DIAGNOSIS — G8929 Other chronic pain: Secondary | ICD-10-CM

## 2023-04-12 MED ORDER — HYDROCODONE-ACETAMINOPHEN 5-325 MG PO TABS: 1.0000 | ORAL_TABLET | Freq: Four times a day (QID) | ORAL | 0 refills | Status: DC | PRN

## 2023-04-12 MED ORDER — HYDROMORPHONE HCL 4 MG PO TABS
2.0000 mg | ORAL_TABLET | ORAL | 0 refills | Status: DC | PRN
Start: 2023-04-12 — End: 2023-04-27

## 2023-04-12 NOTE — Telephone Encounter (Signed)
 See mychart notes.

## 2023-04-17 DIAGNOSIS — Z9682 Presence of neurostimulator: Secondary | ICD-10-CM | POA: Diagnosis not present

## 2023-04-17 DIAGNOSIS — T85840A Pain due to nervous system prosthetic devices, implants and grafts, initial encounter: Secondary | ICD-10-CM | POA: Diagnosis not present

## 2023-04-17 DIAGNOSIS — R195 Other fecal abnormalities: Secondary | ICD-10-CM | POA: Diagnosis not present

## 2023-04-18 ENCOUNTER — Encounter: Payer: Self-pay | Admitting: Family Medicine

## 2023-04-18 DIAGNOSIS — Z45018 Encounter for adjustment and management of other part of cardiac pacemaker: Secondary | ICD-10-CM | POA: Diagnosis not present

## 2023-04-26 ENCOUNTER — Other Ambulatory Visit: Payer: Self-pay | Admitting: Family Medicine

## 2023-04-26 ENCOUNTER — Encounter: Payer: Self-pay | Admitting: Family Medicine

## 2023-04-26 DIAGNOSIS — G8929 Other chronic pain: Secondary | ICD-10-CM

## 2023-04-27 ENCOUNTER — Other Ambulatory Visit: Payer: Self-pay | Admitting: Family Medicine

## 2023-04-27 DIAGNOSIS — G8929 Other chronic pain: Secondary | ICD-10-CM

## 2023-04-27 MED ORDER — HYDROMORPHONE HCL 4 MG PO TABS
2.0000 mg | ORAL_TABLET | ORAL | 0 refills | Status: DC | PRN
Start: 2023-04-27 — End: 2023-05-02

## 2023-04-28 MED ORDER — ZOLPIDEM TARTRATE 10 MG PO TABS: ORAL_TABLET | ORAL | 0 refills | Status: DC

## 2023-05-02 ENCOUNTER — Telehealth: Payer: Self-pay

## 2023-05-02 ENCOUNTER — Encounter: Payer: Self-pay | Admitting: Family Medicine

## 2023-05-02 ENCOUNTER — Ambulatory Visit (INDEPENDENT_AMBULATORY_CARE_PROVIDER_SITE_OTHER): Admitting: Family Medicine

## 2023-05-02 VITALS — BP 112/64 | HR 65 | Temp 98.0°F | Ht 72.0 in | Wt 303.0 lb

## 2023-05-02 DIAGNOSIS — M549 Dorsalgia, unspecified: Secondary | ICD-10-CM

## 2023-05-02 DIAGNOSIS — G8929 Other chronic pain: Secondary | ICD-10-CM

## 2023-05-02 DIAGNOSIS — R739 Hyperglycemia, unspecified: Secondary | ICD-10-CM | POA: Diagnosis not present

## 2023-05-02 DIAGNOSIS — M109 Gout, unspecified: Secondary | ICD-10-CM | POA: Diagnosis not present

## 2023-05-02 DIAGNOSIS — G894 Chronic pain syndrome: Secondary | ICD-10-CM

## 2023-05-02 DIAGNOSIS — N200 Calculus of kidney: Secondary | ICD-10-CM | POA: Diagnosis not present

## 2023-05-02 LAB — HEMOGLOBIN A1C: Hgb A1c MFr Bld: 5.6 % (ref 4.6–6.5)

## 2023-05-02 LAB — URIC ACID: Uric Acid, Serum: 6.3 mg/dL (ref 4.0–7.8)

## 2023-05-02 MED ORDER — TAMSULOSIN HCL 0.4 MG PO CAPS
0.4000 mg | ORAL_CAPSULE | Freq: Every day | ORAL | 3 refills | Status: DC | PRN
Start: 2023-05-02 — End: 2023-12-08

## 2023-05-02 MED ORDER — MORPHINE SULFATE ER 15 MG PO TBCR: 30.0000 mg | EXTENDED_RELEASE_TABLET | Freq: Two times a day (BID) | ORAL | 0 refills | Status: DC

## 2023-05-02 NOTE — Progress Notes (Unsigned)
 Back pain follow up.   Had stimulator removed in the meantime. When he woke up from surgery, the pressure from the implant was clearly better.  He is still dealing with post op pain and and L leg pain.  "The last 2 weeks have been rough" with post op pain.  He is going to check on getting an ablation for his residual lower back pain.   UDS pending.  D/w pt about med change given the lack of effect overall with current meds.  No R leg pain.  Pain radiates down the L leg to just above the knee.  Not sedated with current med use. BM this AM.   He got approved for housing and is trying to get set up for a location.  He has temporary housing in the meantime.  He is going to [facility] today.    He needed a letter for that and re: his situation with his dog.  His mood is better when he is able to spend time with his dog.    He is in the midst of passing a renal stone.  D/w pt about flomax use.  Rx sent.   D/w pt about checking A1c.  D/w pt about limiting late night snacking.  Some weight regain but overall net loss from prior max weight.  D/w pt.    He had been off allopurinol since the surgery.  D/w pt about restart.  Uric acid pending.   Meds, vitals, and allergies reviewed.   ROS: Per HPI unless specifically indicated in ROS section   Nad Nact Neck supple, no LA Rrr Ctab Abd soft, not ttp Old vertical scar and new healing R lower back horizontal scar.   He is locally hypersensitive around the most recent surgical site and also on the left thigh. He is able to bear weight but is walking with a limp.  35 minutes were devoted to patient care in this encounter (this includes time spent reviewing the patient's file/history, interviewing and examining the patient, counseling/reviewing plan with patient).

## 2023-05-02 NOTE — Patient Instructions (Addendum)
 Go to the lab on the way out.   If you have mychart we'll likely use that to update you.    Take care.  Glad to see you.  Update me in about 2-3 days about your situation.  Stop hydrocodone and dilaudid and change to ER morphine.  I would start with 3 tabs per day (2+1) but increase to 4 (2+2) if needed.    Please call about getting the ablation set up.

## 2023-05-02 NOTE — Telephone Encounter (Signed)
 Called pharmacy, was on hold for approx 20 minutes, then the call was dropped.  I didn't end the call.    Tried to return call to pharmacy.  Couldn't get through after waiting on hold.  Will try to call again later.    Was able to get through and discussed case with pharmacist Alecia Lemming).  He can fill rx at pharmacy.  Patient may need to get pharmacy assigned to [facility] for future refills and pharmacy staff can talk to patient about that. All questions answered.

## 2023-05-02 NOTE — Telephone Encounter (Signed)
 Received call from Alecia Lemming at [facility], He had several questions in reference to patient. Questions that I am unable to answer. He wanted to know when the patient last picked up the hydromorphone that was discontinued today. Please reach out to the pharmacist at [phone]. The patient is new to that location.  Also the patient picked up the dilaudid last on 04/27/23 from [facility] in [place]

## 2023-05-03 ENCOUNTER — Encounter: Payer: Self-pay | Admitting: Family Medicine

## 2023-05-03 DIAGNOSIS — R739 Hyperglycemia, unspecified: Secondary | ICD-10-CM | POA: Insufficient documentation

## 2023-05-03 NOTE — Assessment & Plan Note (Addendum)
 With previous spinal stimulator placed and now removed.  S/p laminectomy with ablation not yet done.  He is dealing with postop pain in the meantime.  The pressure from the implant is better in the meantime.  He is going to call about scheduling an ablation. See notes on labs today.  Stop hydrocodone and Dilaudid.  Changed to longer acting morphine.  I asked him to update me in about 2-3 days about his situation.  I would start with 3 tabs per day (2+1) but increase to 4 (2+2) if needed.  Sedation caution discussed with patient.  He agrees to plan.

## 2023-05-03 NOTE — Assessment & Plan Note (Signed)
History of.  See notes on labs. 

## 2023-05-04 LAB — DRUG MONITORING, PANEL 8 WITH CONFIRMATION, URINE
6 Acetylmorphine: NEGATIVE ng/mL (ref <10)
Alcohol Metabolites: NEGATIVE ng/mL (ref <500)
Amphetamines: NEGATIVE ng/mL (ref <500)
Benzodiazepines: NEGATIVE ng/mL (ref <100)
Buprenorphine, Urine: NEGATIVE ng/mL (ref <5)
Cocaine Metabolite: NEGATIVE ng/mL (ref <150)
Codeine: NEGATIVE ng/mL (ref <50)
Creatinine: 144.6 mg/dL (ref >=20.0)
Hydrocodone: 621 ng/mL — ABNORMAL HIGH (ref <50)
Hydromorphone: 1787 ng/mL — ABNORMAL HIGH (ref <50)
MDMA: NEGATIVE ng/mL (ref <500)
Marijuana Metabolite: NEGATIVE ng/mL (ref <20)
Morphine: NEGATIVE ng/mL (ref <50)
Norhydrocodone: 3359 ng/mL — ABNORMAL HIGH (ref <50)
Opiates: POSITIVE ng/mL — AB (ref <100)
Oxidant: NEGATIVE ug/mL (ref <200)
Oxycodone: NEGATIVE ng/mL (ref <100)
pH: 7.6 (ref 4.5–9.0)

## 2023-05-04 LAB — DM TEMPLATE

## 2023-05-07 ENCOUNTER — Other Ambulatory Visit: Payer: Self-pay | Admitting: Family Medicine

## 2023-05-07 MED ORDER — MORPHINE SULFATE ER 15 MG PO TBCR: 30.0000 mg | EXTENDED_RELEASE_TABLET | Freq: Two times a day (BID) | ORAL | 0 refills | Status: DC

## 2023-05-08 DIAGNOSIS — Z9079 Acquired absence of other genital organ(s): Secondary | ICD-10-CM | POA: Diagnosis not present

## 2023-05-08 DIAGNOSIS — R1031 Right lower quadrant pain: Secondary | ICD-10-CM | POA: Diagnosis not present

## 2023-05-08 DIAGNOSIS — R1030 Lower abdominal pain, unspecified: Secondary | ICD-10-CM | POA: Diagnosis not present

## 2023-05-08 DIAGNOSIS — R1032 Left lower quadrant pain: Secondary | ICD-10-CM | POA: Diagnosis not present

## 2023-05-08 DIAGNOSIS — N200 Calculus of kidney: Secondary | ICD-10-CM | POA: Diagnosis not present

## 2023-05-08 DIAGNOSIS — N5089 Other specified disorders of the male genital organs: Secondary | ICD-10-CM | POA: Diagnosis not present

## 2023-05-08 DIAGNOSIS — N50819 Testicular pain, unspecified: Secondary | ICD-10-CM | POA: Diagnosis not present

## 2023-05-09 ENCOUNTER — Telehealth: Payer: Self-pay

## 2023-05-09 ENCOUNTER — Other Ambulatory Visit: Payer: Self-pay | Admitting: Family Medicine

## 2023-05-09 ENCOUNTER — Other Ambulatory Visit (HOSPITAL_COMMUNITY): Payer: Self-pay

## 2023-05-09 ENCOUNTER — Telehealth: Payer: Self-pay | Admitting: Family Medicine

## 2023-05-09 NOTE — Telephone Encounter (Signed)
 PA request has been Submitted. New Encounter has been or will be created for follow up. For additional info see Pharmacy Prior Auth telephone encounter from 05/09/23.

## 2023-05-09 NOTE — Telephone Encounter (Signed)
 Received a call from [employer] stating that Medicaid is needing a prior auth done for morphine (MS CONTIN) 15 MG 12 hr tablet. Thank you!

## 2023-05-09 NOTE — Telephone Encounter (Signed)
 Reason for CRM: Need the prior authorization to be submitted , Rep stated it has been submitted through Covermymeds the code is: [id number] morphine (MS CONTIN) 15 MG 12 hr tablet.

## 2023-05-09 NOTE — Telephone Encounter (Signed)
 Pharmacy Patient Advocate Encounter   Received notification from Pt Calls Messages that prior authorization for Morphine Sulfate ER 15MG  er tablets  is required/requested.   Insurance verification completed.   The patient is insured through [facility] .   Per test claim: PA required; PA submitted to above mentioned insurance via CoverMyMeds Key/confirmation #/EOC [facility] Status is pending

## 2023-05-09 NOTE — Telephone Encounter (Unsigned)
 Copied from CRM [id number]. Topic: Clinical - Medication Refill >> May 09, 2023 10:10 AM Cottie Diss B wrote: Most Recent Primary Care Visit:  Provider: Donnie Galea  Department: [facility]  Visit Type: OFFICE VISIT  Date: 05/02/2023  Medication: morphine (MS CONTIN) 15 MG 12 hr tablet  Has the patient contacted their pharmacy? Yes, needs to be resent (Agent: If no, request that the patient contact the pharmacy for the refill. If patient does not wish to contact the pharmacy document the reason why and proceed with request.) (Agent: If yes, when and what did the pharmacy advise?)  Is this the correct pharmacy for this prescription? Yes If no, delete pharmacy and type the correct one.  This is the patient's preferred pharmacy:  [facility] # [address] [place], [place] - [address] [facility] [address] [place] [address] Phone: [phone] Fax: [phone]  Has the prescription been filled recently? Yes  Is the patient out of the medication? Yes  Has the patient been seen for an appointment in the last year OR does the patient have an upcoming appointment? Yes  Can we respond through MyChart? Yes  Agent: Please be advised that Rx refills may take up to 3 business days. We ask that you follow-up with your pharmacy.

## 2023-05-10 ENCOUNTER — Other Ambulatory Visit (HOSPITAL_COMMUNITY): Payer: Self-pay

## 2023-05-10 ENCOUNTER — Encounter: Payer: Self-pay | Admitting: Family Medicine

## 2023-05-10 ENCOUNTER — Other Ambulatory Visit: Payer: Self-pay | Admitting: Family Medicine

## 2023-05-10 ENCOUNTER — Ambulatory Visit: Payer: Self-pay

## 2023-05-10 ENCOUNTER — Telehealth: Payer: Self-pay

## 2023-05-10 MED ORDER — MORPHINE SULFATE ER 15 MG PO TBCR: 30.0000 mg | EXTENDED_RELEASE_TABLET | Freq: Two times a day (BID) | ORAL | 0 refills | Status: DC

## 2023-05-10 NOTE — Telephone Encounter (Signed)
 Copied from CRM [id number]. Topic: Clinical - Red Word Triage >> May 10, 2023 10:17 AM Marissa P wrote: Red Word that prompted transfer to Nurse Triage: Patient called in stating that he's in a lot of pain in back and groin area and is out of his pain meds. He's aware his PCP is out of office. He needs help immediately.    Chief Complaint: Requesting MS Contin be sent to his [facility] pharmacy. This request has been sent, but pt. Requests additional message be sent. Symptoms: n/a Frequency: n/a Pertinent Negatives: Patient denies  Disposition: [] ED /[] Urgent Care (no appt availability in office) / [] Appointment(In office/virtual)/ []  [place] [facility]/ [] Home Care/ [] Refused Recommended Disposition /[] [place] [facility]/ [x]  Follow-up with PCP Additional Notes: Please advise pt.   Reason for Disposition  [1] Prescription not at pharmacy AND [2] was prescribed by PCP recently  (Exception: [facility] has access to EMR and prescription is recorded there. Go to Home Care and confirm for pharmacy.)  Answer Assessment - Initial Assessment Questions 1. DRUG NAME: "What medicine do you need to have refilled?"     MS Contin 2. REFILLS REMAINING: "How many refills are remaining?" (Note: The label on the medicine or pill bottle will show how many refills are remaining. If there are no refills remaining, then a renewal may be needed.)     0 3. EXPIRATION DATE: "What is the expiration date?" (Note: The label states when the prescription will expire, and thus can no longer be refilled.)     N/a 4. PRESCRIBING HCP: "Who prescribed it?" Reason: If prescribed by specialist, call should be referred to that group.     Dr. Vallarie Gauze 5. SYMPTOMS: "Do you have any symptoms?"     N/a 6. PREGNANCY: "Is there any chance that you are pregnant?" "When was your last menstrual period?"     N/a  Protocols used: Medication Refill and Renewal Call-A-AH

## 2023-05-10 NOTE — Telephone Encounter (Signed)
 Pharmacy Patient Advocate Encounter  Received notification from [facility] that Prior Authorization for MORPHINE SULF ER 15 MG has been APPROVED from 05/10/2023 to 08/08/2023   PA #/Case ID/Reference #: [id number]

## 2023-05-10 NOTE — Telephone Encounter (Signed)
 Copied from CRM [id number]. Topic: Clinical - Prescription Issue >> May 09, 2023  4:48 PM Casey Caldwell wrote: Reason for CRM: Patient is out morphine (MS CONTIN) 15 MG 12 hr tablet Patient is calling after he had a medication sent to a different pharmacy and then now sending to [facility] pharmacy and the patient is willing to pay out pocket and this was a test done Dr Vallarie Gauze and now a prescription created for the patient to take on regular basis but his main concern is running out of medication and having side effects when not being able to get this prescription filled soon  Patient call back [phone] [facility] # [address] [place], [place] - [address] [facility] [address] [place] [address] Phone: [phone] Fax: [phone]

## 2023-05-11 NOTE — Telephone Encounter (Signed)
Closing encounter this has been addressed

## 2023-05-11 NOTE — Telephone Encounter (Signed)
 See mychart message 4/16 PM . I sent MS Contin in to [facility]

## 2023-05-14 NOTE — Telephone Encounter (Signed)
 Noted. Thanks.

## 2023-05-14 NOTE — Telephone Encounter (Signed)
 Agree. Thanks

## 2023-05-22 ENCOUNTER — Telehealth: Payer: Self-pay

## 2023-05-22 ENCOUNTER — Other Ambulatory Visit (HOSPITAL_COMMUNITY): Payer: Self-pay

## 2023-05-22 ENCOUNTER — Ambulatory Visit: Admitting: Family Medicine

## 2023-05-22 ENCOUNTER — Encounter: Payer: Self-pay | Admitting: Family Medicine

## 2023-05-22 VITALS — BP 132/68 | HR 92 | Temp 98.5°F | Ht 72.0 in | Wt 299.0 lb

## 2023-05-22 DIAGNOSIS — G8929 Other chronic pain: Secondary | ICD-10-CM

## 2023-05-22 DIAGNOSIS — M549 Dorsalgia, unspecified: Secondary | ICD-10-CM | POA: Diagnosis not present

## 2023-05-22 MED ORDER — MORPHINE SULFATE ER 15 MG PO TBCR: EXTENDED_RELEASE_TABLET | ORAL | 0 refills | Status: DC

## 2023-05-22 MED ORDER — MORPHINE SULFATE 15 MG PO TABS: 7.5000 mg | ORAL_TABLET | Freq: Two times a day (BID) | ORAL | 0 refills | Status: DC | PRN

## 2023-05-22 NOTE — Progress Notes (Addendum)
 F/u for chronic pain.  Transitioned to long acting morphine .  He feels better in the meantime, his pain is better after surgery.  His surgery site is less painful.  Movement is better.  He feels better overall but he has to be careful about being overactive.  The worse pain now is L groin and L flank (and that flank pain is attributed to a renal stone). When he takes morphine  it lasts for about 8-9 hours. D/w pt about options.  Currently taking 4 tabs per day, taking 60mg  morphine  per day.    His is more active and his outlook is better with better pain control.    He has a renal stone.  Will restart flomax .   Discussed. Housing d/w pt.  He is hoping to hear about an apartment this week.    He has an appointment tomorrow with [facility] in [facility] tomorrow for consideration of ablation.  I will await that consult note.  He bumped his L knee last week and is still sore at the insertion of the L quad ligament.  D/w pt about icing.    Meds, vitals, and allergies reviewed.   ROS: Per HPI unless specifically indicated in ROS section   Nad Ncat Not sedated.  Neck supple, no LA Lower back hypersensitive to touch bilaterally. Healed surgery sites x2, no erythema.   Medial and lateral L knee not ttp but sore at the insertion of the L quad ligament.  L thigh hypersensitive with normal cap refill.   Strength grossly wnl BLE Rrr Ctab Abd soft, not ttp Speech and judgment normal.  Affect normal.  I checked the PDMP/ controlled substance database on 05/22/2023 at 8:26 AM at the office visit.

## 2023-05-22 NOTE — Telephone Encounter (Signed)
 Pharmacy Patient Advocate Encounter   Received notification from CoverMyMeds that prior authorization for Morphine  Sulfate 15MG  tablets is required/requested.   Insurance verification completed.   The patient is insured through [facility] .   Per test claim: PA required; PA submitted to above mentioned insurance via CoverMyMeds Key/confirmation #/EOC [id number] Status is pending

## 2023-05-22 NOTE — Patient Instructions (Addendum)
 Decrease long acting morphine  and use a 1/2 tab of short acting morphine  twice a day if needed.   Update me in about 1 week, sooner if needed.   Ice your knee for 5 minutes in the meantime.   I'll await the spine/pain clinic notes.  Take care.  Glad to see you.

## 2023-05-23 DIAGNOSIS — Z79891 Long term (current) use of opiate analgesic: Secondary | ICD-10-CM | POA: Diagnosis not present

## 2023-05-23 DIAGNOSIS — G8929 Other chronic pain: Secondary | ICD-10-CM | POA: Diagnosis not present

## 2023-05-23 DIAGNOSIS — M545 Low back pain, unspecified: Secondary | ICD-10-CM | POA: Diagnosis not present

## 2023-05-24 ENCOUNTER — Other Ambulatory Visit: Payer: Self-pay | Admitting: Physician Assistant

## 2023-05-24 ENCOUNTER — Encounter: Payer: Self-pay | Admitting: Physician Assistant

## 2023-05-24 DIAGNOSIS — M545 Low back pain, unspecified: Secondary | ICD-10-CM

## 2023-05-24 NOTE — Assessment & Plan Note (Signed)
 With improvement overall with the recent surgery and with medication change.  Not sedated.  Still okay for outpatient follow-up.  He has spine clinic follow-up pending for tomorrow regarding potential ablation. Discussed options regarding morphine .  Decrease long acting morphine  (he could take 2 pills in the morning and 1 pill in the evening or he could try taking 1 pill 3 times a day) and use a 1/2 tab of short acting morphine  twice a day if needed.  The goal is to gradually taper down his opiate use.  He agrees.  I asked him to update me in about 1 week, sooner if needed.   Separate issue regarding his knee, I asked him to ice his knee for 5 minutes at a time in the meantime.   I'll await the spine/pain clinic notes.

## 2023-05-25 NOTE — Telephone Encounter (Signed)
 Additional information has been requested from the patient's insurance in order to proceed with the prior authorization request.     I did not see where the provider had checked the [place] controlled substance reporting system in the chart notes to send back. If he could make a note of it, I will fax it to the insurance.   I have also indexed a copy of the form to the media tab.

## 2023-05-25 NOTE — Telephone Encounter (Signed)
 Please see Dr. Vallarie Gauze response

## 2023-05-25 NOTE — Telephone Encounter (Signed)
 I checked the PDMP/[place] controlled substance database on 05/22/2023 at 8:26 AM at the office visit.  Thanks.

## 2023-05-29 ENCOUNTER — Other Ambulatory Visit (HOSPITAL_COMMUNITY): Payer: Self-pay

## 2023-05-30 NOTE — Telephone Encounter (Signed)
 The insurance has denied this request. Dr. Vallarie Gauze, could you please update the chart notes including .... I checked the PDMP/ controlled substance database on 05/22/2023 at 8:26 AM at the office visit.   So that I could resubmit it to the insurance. Thank you.

## 2023-05-31 NOTE — Telephone Encounter (Signed)
 I updated the note.  Thanks.

## 2023-06-01 ENCOUNTER — Other Ambulatory Visit (HOSPITAL_COMMUNITY): Payer: Self-pay

## 2023-06-01 NOTE — Telephone Encounter (Signed)
 Resubmitted with KEY # [id number]

## 2023-06-01 NOTE — Telephone Encounter (Signed)
 Pharmacy Patient Advocate Encounter  Received notification from [facility] that Prior Authorization for Morphine  Sulfate 15MG  tabletshas been APPROVED from 06/01/23 to 11/28/23   PA #/Case ID/Reference #: [id number]

## 2023-06-05 ENCOUNTER — Encounter: Payer: Self-pay | Admitting: Family Medicine

## 2023-06-05 ENCOUNTER — Other Ambulatory Visit: Payer: Self-pay | Admitting: Family Medicine

## 2023-06-07 MED ORDER — MORPHINE SULFATE ER 15 MG PO TBCR: EXTENDED_RELEASE_TABLET | ORAL | 0 refills | Status: DC

## 2023-06-07 MED ORDER — MORPHINE SULFATE 15 MG PO TABS: 7.5000 mg | ORAL_TABLET | Freq: Two times a day (BID) | ORAL | 0 refills | Status: DC | PRN

## 2023-06-07 NOTE — Telephone Encounter (Signed)
 Sent. Thanks.

## 2023-06-08 ENCOUNTER — Other Ambulatory Visit: Payer: Self-pay | Admitting: Family Medicine

## 2023-06-14 ENCOUNTER — Other Ambulatory Visit

## 2023-06-15 ENCOUNTER — Encounter: Payer: Self-pay | Admitting: Family Medicine

## 2023-06-15 ENCOUNTER — Inpatient Hospital Stay: Admission: RE | Admit: 2023-06-15 | Source: Ambulatory Visit

## 2023-06-16 DIAGNOSIS — M545 Low back pain, unspecified: Secondary | ICD-10-CM | POA: Diagnosis not present

## 2023-06-16 DIAGNOSIS — Z79891 Long term (current) use of opiate analgesic: Secondary | ICD-10-CM | POA: Diagnosis not present

## 2023-06-16 DIAGNOSIS — G8929 Other chronic pain: Secondary | ICD-10-CM | POA: Diagnosis not present

## 2023-06-19 ENCOUNTER — Other Ambulatory Visit: Payer: Self-pay | Admitting: Family Medicine

## 2023-06-19 MED ORDER — MORPHINE SULFATE 15 MG PO TABS: 7.5000 mg | ORAL_TABLET | Freq: Two times a day (BID) | ORAL | 0 refills | Status: DC | PRN

## 2023-06-19 MED ORDER — MORPHINE SULFATE ER 15 MG PO TBCR
EXTENDED_RELEASE_TABLET | ORAL | 0 refills | Status: DC
Start: 2023-06-19 — End: 2023-07-02

## 2023-06-27 DIAGNOSIS — M4186 Other forms of scoliosis, lumbar region: Secondary | ICD-10-CM | POA: Diagnosis not present

## 2023-06-27 DIAGNOSIS — M545 Low back pain, unspecified: Secondary | ICD-10-CM | POA: Diagnosis not present

## 2023-06-27 DIAGNOSIS — M5136 Other intervertebral disc degeneration, lumbar region with discogenic back pain only: Secondary | ICD-10-CM | POA: Diagnosis not present

## 2023-06-27 DIAGNOSIS — M5186 Other intervertebral disc disorders, lumbar region: Secondary | ICD-10-CM | POA: Diagnosis not present

## 2023-06-29 ENCOUNTER — Encounter: Payer: Self-pay | Admitting: Family Medicine

## 2023-07-02 ENCOUNTER — Other Ambulatory Visit: Payer: Self-pay | Admitting: Family Medicine

## 2023-07-02 MED ORDER — MORPHINE SULFATE 15 MG PO TABS: 7.5000 mg | ORAL_TABLET | Freq: Two times a day (BID) | ORAL | 0 refills | Status: DC | PRN

## 2023-07-02 MED ORDER — MORPHINE SULFATE ER 15 MG PO TBCR: EXTENDED_RELEASE_TABLET | ORAL | 0 refills | Status: DC

## 2023-07-03 ENCOUNTER — Other Ambulatory Visit: Payer: Self-pay | Admitting: Family Medicine

## 2023-07-03 MED ORDER — COLCHICINE 0.6 MG PO TABS: ORAL_TABLET | ORAL | 1 refills | Status: DC

## 2023-07-03 MED ORDER — MORPHINE SULFATE 15 MG PO TABS: 7.5000 mg | ORAL_TABLET | Freq: Two times a day (BID) | ORAL | 0 refills | Status: DC | PRN

## 2023-07-03 MED ORDER — MORPHINE SULFATE ER 15 MG PO TBCR: EXTENDED_RELEASE_TABLET | ORAL | 0 refills | Status: DC

## 2023-07-03 MED ORDER — ONDANSETRON 4 MG PO TBDP: 4.0000 mg | ORAL_TABLET | Freq: Four times a day (QID) | ORAL | 1 refills | Status: DC | PRN

## 2023-07-03 NOTE — Progress Notes (Signed)
 I sent rx for colchicine  and zofran .  Thanks.

## 2023-07-03 NOTE — Telephone Encounter (Signed)
 Morphine  would need to be resent

## 2023-07-03 NOTE — Addendum Note (Signed)
 Addended by: Donnie Galea on: 07/03/2023 01:51 PM   Modules accepted: Orders

## 2023-07-04 ENCOUNTER — Other Ambulatory Visit: Payer: Self-pay | Admitting: Family Medicine

## 2023-07-04 NOTE — Telephone Encounter (Signed)
 Confirmed received by pharmacy 06/09  Routing to provider for review/authorization.         Copied from CRM [id number]. Topic: Clinical - Prescription Issue >> Jul 04, 2023  2:17 PM Rosamond Comes wrote: Reason for CRM: patient asking about medication refill morphine  (MS CONTIN ) 15 MG 12 hr tablet that should be sent to   [facility] # [address] [place], [place] - [address] [facility] [address] [place] [address] Phone: [phone] Fax: [phone]  Patient has sent messages through MyChart  Please call patient [phone] regarding this issue. He would like to speak to a nurse ok to leave detailed message at [phone]

## 2023-07-05 ENCOUNTER — Ambulatory Visit: Payer: Self-pay

## 2023-07-05 ENCOUNTER — Telehealth: Payer: Self-pay

## 2023-07-05 MED ORDER — MORPHINE SULFATE ER 15 MG PO TBCR: EXTENDED_RELEASE_TABLET | ORAL | 0 refills | Status: DC

## 2023-07-05 MED ORDER — MORPHINE SULFATE 15 MG PO TABS: 7.5000 mg | ORAL_TABLET | Freq: Two times a day (BID) | ORAL | 0 refills | Status: DC | PRN

## 2023-07-05 NOTE — Telephone Encounter (Signed)
 Closing encounter. Issue is being addressed in another encounter

## 2023-07-05 NOTE — Telephone Encounter (Signed)
 I resent both rxs to [id number] - [place], [place] - [address]  I sent a message to patient in the meantime.

## 2023-07-05 NOTE — Telephone Encounter (Addendum)
 Patient states that he is going to go through withdrawals if he doesn't get this medication filled. He is requesting the Morphine  to be sent to [facility] in [place] minus 4 tablets that he already picked up today.    FYI Only or Action Required?: Action required by provider  Patient was last seen in primary care on 05/22/2023 by Donnie Galea, MD. Called Nurse Triage reporting Medication Refill. Symptoms began n/a. Interventions attempted: Other: n/a. Symptoms are: n/a.  Triage Disposition: Call PCP Now  Patient/caregiver understands and will follow disposition?: No, wishes to speak with PCP         Prescription minus 4 tablets to be sent to [facility] in [place] CAL advised to send a CRM.           Reason for Disposition  [1] Prescription refill request for ESSENTIAL medicine (i.e., likelihood of harm to patient if not taken) AND [2] triager unable to refill per department policy  Answer Assessment - Initial Assessment Questions 1. DRUG NAME: What medicine do you need to have refilled?      morphine  (MS CONTIN ) 15 MG 12 hr tablet [[id number]]   2. REFILLS REMAINING: How many refills are remaining? (Note: The label on the medicine or pill bottle will show how many refills are remaining. If there are no refills remaining, then a renewal may be needed.)     ---- 3. EXPIRATION DATE: What is the expiration date? (Note: The label states when the prescription will expire, and thus can no longer be refilled.)     --- 4. PRESCRIBING HCP: Who prescribed it? Reason: If prescribed by specialist, call should be referred to that group.     Richrd Char MD 5. SYMPTOMS: Do you have any symptoms?     N/A   Patient did pick up the 4 tablets that were available at [facility] in [facility] so patient needs the new prescription minus 4 tablets that he already picked up sent to [facility] in [place] because they stated that they had it in stock.  Protocols used: Medication  Refill and Renewal Call-A-AH

## 2023-07-05 NOTE — Telephone Encounter (Signed)
 Spoke with [facility] as well as the patient.  he did go and pick up 4 pills which they told him that once he picked those up he would need a new prescription sent. At this time though the medication is on back order at the [facility] in [place]. Therefore he would like the prescription to be sent to [facility] in [place].  He did advise that if he does not get it he will be going through detox and abruptly hung up the phone.   He is aware that you are not in office.

## 2023-07-05 NOTE — Addendum Note (Signed)
 Addended by: Donnie Galea on: 07/05/2023 06:10 PM   Modules accepted: Orders

## 2023-07-05 NOTE — Telephone Encounter (Signed)
 Copied from CRM [id number]. Topic: Clinical - Prescription Issue >> Jul 04, 2023  2:17 PM Rosamond Comes wrote: Reason for CRM: patient asking about medication refill morphine  (MS CONTIN ) 15 MG 12 hr tablet that should be sent to   [facility] # [address] [place], [place] - [address] [facility] [address] [place] [address] Phone: [phone] Fax: [phone]  Patient has sent messages through MyChart  Please call patient [phone] regarding this issue. He would like to speak to a nurse ok to leave detailed message at [phone] >> Jul 05, 2023  3:37 PM Odilia Bennett D wrote: Patient called stating he is checking on a medication refill for morphin. Patient stated that the [facility] [place] only had four pills and he want the medication sent to the [facility] in [place] because he is out of it.

## 2023-07-06 ENCOUNTER — Encounter: Payer: Self-pay | Admitting: Family Medicine

## 2023-07-06 ENCOUNTER — Other Ambulatory Visit: Payer: Self-pay | Admitting: Family Medicine

## 2023-07-10 DIAGNOSIS — Z79891 Long term (current) use of opiate analgesic: Secondary | ICD-10-CM | POA: Diagnosis not present

## 2023-07-10 DIAGNOSIS — M47816 Spondylosis without myelopathy or radiculopathy, lumbar region: Secondary | ICD-10-CM | POA: Diagnosis not present

## 2023-07-10 DIAGNOSIS — G8929 Other chronic pain: Secondary | ICD-10-CM | POA: Diagnosis not present

## 2023-07-10 DIAGNOSIS — M545 Low back pain, unspecified: Secondary | ICD-10-CM | POA: Diagnosis not present

## 2023-07-13 ENCOUNTER — Other Ambulatory Visit: Payer: Self-pay | Admitting: Family Medicine

## 2023-07-13 MED ORDER — MORPHINE SULFATE ER 15 MG PO TBCR: EXTENDED_RELEASE_TABLET | ORAL | 0 refills | Status: DC

## 2023-07-13 MED ORDER — MORPHINE SULFATE 15 MG PO TABS: 7.5000 mg | ORAL_TABLET | Freq: Two times a day (BID) | ORAL | 0 refills | Status: DC | PRN

## 2023-07-14 ENCOUNTER — Telehealth: Payer: Self-pay | Admitting: Family Medicine

## 2023-07-14 MED ORDER — MORPHINE SULFATE 15 MG PO TABS: 7.5000 mg | ORAL_TABLET | Freq: Two times a day (BID) | ORAL | 0 refills | Status: DC | PRN

## 2023-07-14 MED ORDER — MORPHINE SULFATE ER 15 MG PO TBCR: EXTENDED_RELEASE_TABLET | ORAL | 0 refills | Status: DC

## 2023-07-14 NOTE — Telephone Encounter (Signed)
 Copied from CRM [id number]. Topic: Clinical - Prescription Issue >> Jul 14, 2023 10:40 AM Martinique E wrote: Reason for CRM: Patient called in regarding both of his Morphine  prescriptions, these got sent to the wrong pharmacy and would need them sent to the [facility] pharmacy in [facility] as they just got stock in. Patient also requesting if the dispense dosage can line up so he can pick up these medications together every 2 weeks instead of every week. One was pick up for Monday, and the other for Thursday, so he wants both to be on Monday, if that is doable.

## 2023-07-14 NOTE — Telephone Encounter (Signed)
 Plz notify I've sent morphine  Rx to [facility] in [facility] He can work with the pharmacy on coordination to try and get both filled on same day.  Cc pcp as fyi

## 2023-07-16 NOTE — Telephone Encounter (Signed)
Thank you for working on this.  

## 2023-07-17 NOTE — Telephone Encounter (Signed)
 Looks like pt's MyChart message was read earlier today.

## 2023-07-17 NOTE — Telephone Encounter (Addendum)
 Lvm asking pt to call back. Need to relay Dr Talmadge message.  Also, sent a MyChart message.

## 2023-07-18 ENCOUNTER — Ambulatory Visit: Admitting: Family Medicine

## 2023-07-18 ENCOUNTER — Encounter: Payer: Self-pay | Admitting: Family Medicine

## 2023-07-18 VITALS — BP 136/74 | HR 80 | Temp 98.4°F | Ht 72.0 in | Wt 295.0 lb

## 2023-07-18 DIAGNOSIS — M48062 Spinal stenosis, lumbar region with neurogenic claudication: Secondary | ICD-10-CM

## 2023-07-18 DIAGNOSIS — R5383 Other fatigue: Secondary | ICD-10-CM

## 2023-07-18 MED ORDER — HYDROCODONE-ACETAMINOPHEN 10-325 MG PO TABS: 1.0000 | ORAL_TABLET | Freq: Four times a day (QID) | ORAL | 0 refills | Status: DC

## 2023-07-18 NOTE — Patient Instructions (Addendum)
 We'll update you about your labs.  Change back to hydrocodone  in the meantime.   Sedation cation.  Take care.  Glad to see you.
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
## 2023-07-19 ENCOUNTER — Other Ambulatory Visit (HOSPITAL_COMMUNITY): Payer: Self-pay

## 2023-07-19 ENCOUNTER — Telehealth: Payer: Self-pay

## 2023-07-19 DIAGNOSIS — R5383 Other fatigue: Secondary | ICD-10-CM | POA: Insufficient documentation

## 2023-07-19 LAB — CBC WITH DIFFERENTIAL/PLATELET
Basophils Absolute: 0 10*3/uL (ref 0.0–0.1)
Basophils Relative: 0.6 % (ref 0.0–3.0)
Eosinophils Absolute: 0.4 10*3/uL (ref 0.0–0.7)
Eosinophils Relative: 4.3 % (ref 0.0–5.0)
HCT: 48 % (ref 39.0–52.0)
Hemoglobin: 16.5 g/dL (ref 13.0–17.0)
Lymphocytes Relative: 28.8 % (ref 12.0–46.0)
Lymphs Abs: 2.4 10*3/uL (ref 0.7–4.0)
MCHC: 34.3 g/dL (ref 30.0–36.0)
MCV: 88 fl (ref 78.0–100.0)
Monocytes Absolute: 1 10*3/uL (ref 0.1–1.0)
Monocytes Relative: 11.2 % (ref 3.0–12.0)
Neutro Abs: 4.7 10*3/uL (ref 1.4–7.7)
Neutrophils Relative %: 55.1 % (ref 43.0–77.0)
Platelets: 325 10*3/uL (ref 150.0–400.0)
RBC: 5.46 Mil/uL (ref 4.22–5.81)
RDW: 14 % (ref 11.5–15.5)
WBC: 8.5 10*3/uL (ref 4.0–10.5)

## 2023-07-19 LAB — COMPREHENSIVE METABOLIC PANEL WITH GFR
ALT: 22 U/L (ref 0–53)
AST: 22 U/L (ref 0–37)
Albumin: 4.6 g/dL (ref 3.5–5.2)
Alkaline Phosphatase: 71 U/L (ref 39–117)
BUN: 13 mg/dL (ref 6–23)
CO2: 32 meq/L (ref 19–32)
Calcium: 9.6 mg/dL (ref 8.4–10.5)
Chloride: 101 meq/L (ref 96–112)
Creatinine, Ser: 0.77 mg/dL (ref 0.40–1.50)
GFR: 108.35 mL/min (ref >60.00)
Glucose, Bld: 67 mg/dL — ABNORMAL LOW (ref 70–99)
Potassium: 4.6 meq/L (ref 3.5–5.1)
Sodium: 141 meq/L (ref 135–145)
Total Bilirubin: 0.8 mg/dL (ref 0.2–1.2)
Total Protein: 6.6 g/dL (ref 6.0–8.3)

## 2023-07-19 LAB — VITAMIN B12: Vitamin B-12: 382 pg/mL (ref 211–911)

## 2023-07-19 LAB — TSH: TSH: 2.43 u[IU]/mL (ref 0.35–5.50)

## 2023-07-19 NOTE — Assessment & Plan Note (Signed)
 I called pharmacy at the office visit and was told there was a diffuse shortage on ER morphine .  They cannot fill his rx.  It does not appear that other pharmacies will be able to fill his prescription either, based on the information I was given by the pharmacist.  They do have hydrocodone  in stock.  At this point if we do not change him to a different opiate I would expect him to have to abruptly stop his current medication when he runs out and that would clearly not be ideal.  Given all of that, prescription sent for hydrocodone  10/325 with routine cautions given to patient.  Receipt was verified by the pharmacy.  Other morphine  prescriptions on file at pharmacy have been canceled out by the pharmacist after our discussion and upon my request.  He can let me know how he does in terms of pain control and he is going to follow-up with the spine/pain clinic next week.

## 2023-07-19 NOTE — Assessment & Plan Note (Signed)
 With multiple potential contributing factors.  See labs to evaluate for some reversible causes.

## 2023-07-19 NOTE — Telephone Encounter (Signed)
 Pharmacy Patient Advocate Encounter   Received notification from CoverMyMeds that prior authorization for HYDROcodone -Acetaminophen  10-325MG  tablets is required/requested.   Insurance verification completed.   The patient is insured through [facility] .   Per test claim: PA required; PA submitted to above mentioned insurance via CoverMyMeds Key/confirmation #/EOC [id number] Status is pending

## 2023-07-20 ENCOUNTER — Other Ambulatory Visit: Payer: Self-pay | Admitting: Family Medicine

## 2023-07-20 ENCOUNTER — Ambulatory Visit: Payer: Self-pay | Admitting: Family Medicine

## 2023-07-20 MED ORDER — HYDROCODONE-ACETAMINOPHEN 10-325 MG PO TABS: 1.0000 | ORAL_TABLET | Freq: Four times a day (QID) | ORAL | 0 refills | Status: DC

## 2023-07-20 NOTE — Telephone Encounter (Signed)
 Please start PA for hydrocodone .  Rx resent.

## 2023-07-20 NOTE — Telephone Encounter (Signed)
 Prior authorization has been started

## 2023-07-23 ENCOUNTER — Other Ambulatory Visit: Payer: Self-pay | Admitting: Family Medicine

## 2023-07-23 MED ORDER — ZOLPIDEM TARTRATE 10 MG PO TABS: ORAL_TABLET | ORAL | 0 refills | Status: DC

## 2023-07-24 ENCOUNTER — Telehealth: Payer: Self-pay | Admitting: Family Medicine

## 2023-07-24 NOTE — Telephone Encounter (Signed)
 I started a new thread here.    Please see below- I pasted his mychart message.  Please call about the PA ASAP.  Thanks.   ============ Hi, I just got off the phone with insurance. They denied the prior authorization and said they didn't get sufficient information .  They said the best way is to call [phone]. My script is out as of today.  Thank you

## 2023-07-25 ENCOUNTER — Telehealth: Payer: Self-pay

## 2023-07-25 NOTE — Telephone Encounter (Signed)
 I sent a message to the prior authorization team to call and see if any additional information can be provided about the denial and this is what was sent back to me.

## 2023-07-25 NOTE — Telephone Encounter (Signed)
 Patient states that the prior authoriztion for the HYDROcodone -acetaminophen  (NORCO) 10-325 MG tablet  was denied. I am unclear what other additional information that is needed. Can we get some kind of assistance.

## 2023-07-25 NOTE — Telephone Encounter (Signed)
 Closing encounter. Message has been sent to provider in another encounter

## 2023-07-25 NOTE — Telephone Encounter (Signed)
 Pharmacy Patient Advocate Encounter  Received notification from [facility] that Prior Authorization for Norco 10 has been DENIED.  Full denial letter will be uploaded to the media tab. See denial reason below.   PA #/Case ID/Reference #: [id number]

## 2023-07-25 NOTE — Telephone Encounter (Signed)
 What do they need, other than the fact that the patient has chronic ongoing pain, s/p mult back surgeries with spine/pain clinic follow up pending, with prev spinal stimulator placed?

## 2023-07-25 NOTE — Telephone Encounter (Signed)
Please see note from provider.

## 2023-07-26 ENCOUNTER — Telehealth: Payer: Self-pay

## 2023-07-26 ENCOUNTER — Other Ambulatory Visit (HOSPITAL_COMMUNITY): Payer: Self-pay

## 2023-07-26 DIAGNOSIS — M5416 Radiculopathy, lumbar region: Secondary | ICD-10-CM | POA: Diagnosis not present

## 2023-07-26 MED ORDER — HYDROCODONE-ACETAMINOPHEN 10-325 MG PO TABS: 1.0000 | ORAL_TABLET | Freq: Four times a day (QID) | ORAL | 0 refills | Status: DC

## 2023-07-26 NOTE — Telephone Encounter (Signed)
 Pharmacy Patient Advocate Encounter  Received notification from [facility] that Prior Authorization for Norco 10 has been Approved. Unable to test bill since patient is locked into a specific pharmacy

## 2023-07-26 NOTE — Telephone Encounter (Signed)
 It was approved

## 2023-07-26 NOTE — Addendum Note (Signed)
 Addended by: [name] [name] on: 07/26/2023 04:44 PM   Modules accepted: Orders

## 2023-07-26 NOTE — Telephone Encounter (Signed)
 I don't understand why this was denied. Please send any needed/requested medical records, assuming we have permission to do so.  Thanks.

## 2023-07-26 NOTE — Telephone Encounter (Signed)
 Patient notified of the approval. He states that he just needs a refill sent

## 2023-07-26 NOTE — Telephone Encounter (Signed)
 Sent. Thanks.

## 2023-07-26 NOTE — Telephone Encounter (Signed)
 Pharmacy Patient Advocate Encounter   Received notification from Physician's Office that prior authorization for Norco 10 is required/requested.   Insurance verification completed.   The patient is insured through [facility] .   Per test claim: PA required; PA submitted to above mentioned insurance via CoverMyMeds Key/confirmation #/EOC [facility] Status is pending   Included provider telephone note

## 2023-07-26 NOTE — Telephone Encounter (Signed)
 Pharmacy Patient Advocate Encounter  Received notification from [facility] that Prior Authorization for Norco 10 has been DENIED.  Full denial letter will be uploaded to the media tab. See denial reason below.    To answer your earlier question, current treatment notes, care plan chart notes and [place] opioid plan notes are what is needed. Progress notes from 06/16/23 and 07/18/23 plus telephone encounter from 07/25/23 was included with this PA request. PA #/Case ID/Reference #: [id number]

## 2023-07-26 NOTE — Telephone Encounter (Signed)
Awaiting authorization. 

## 2023-07-26 NOTE — Telephone Encounter (Signed)
 Noted. Thanks.

## 2023-08-08 ENCOUNTER — Other Ambulatory Visit: Payer: Self-pay | Admitting: Family Medicine

## 2023-08-08 MED ORDER — HYDROCODONE-ACETAMINOPHEN 10-325 MG PO TABS: 1.0000 | ORAL_TABLET | Freq: Four times a day (QID) | ORAL | 0 refills | Status: DC

## 2023-08-10 DIAGNOSIS — M545 Low back pain, unspecified: Secondary | ICD-10-CM | POA: Diagnosis not present

## 2023-08-10 DIAGNOSIS — Z79891 Long term (current) use of opiate analgesic: Secondary | ICD-10-CM | POA: Diagnosis not present

## 2023-08-10 DIAGNOSIS — M47816 Spondylosis without myelopathy or radiculopathy, lumbar region: Secondary | ICD-10-CM | POA: Diagnosis not present

## 2023-08-10 DIAGNOSIS — G8929 Other chronic pain: Secondary | ICD-10-CM | POA: Diagnosis not present

## 2023-08-15 ENCOUNTER — Other Ambulatory Visit: Payer: Self-pay | Admitting: Family Medicine

## 2023-08-16 ENCOUNTER — Encounter: Payer: Self-pay | Admitting: Family Medicine

## 2023-08-16 DIAGNOSIS — R1032 Left lower quadrant pain: Secondary | ICD-10-CM | POA: Diagnosis not present

## 2023-08-16 DIAGNOSIS — N200 Calculus of kidney: Secondary | ICD-10-CM | POA: Diagnosis not present

## 2023-08-16 DIAGNOSIS — K219 Gastro-esophageal reflux disease without esophagitis: Secondary | ICD-10-CM | POA: Diagnosis not present

## 2023-08-16 DIAGNOSIS — Z5321 Procedure and treatment not carried out due to patient leaving prior to being seen by health care provider: Secondary | ICD-10-CM | POA: Diagnosis not present

## 2023-08-16 DIAGNOSIS — Z79899 Other long term (current) drug therapy: Secondary | ICD-10-CM | POA: Diagnosis not present

## 2023-08-16 DIAGNOSIS — R160 Hepatomegaly, not elsewhere classified: Secondary | ICD-10-CM | POA: Diagnosis not present

## 2023-08-17 ENCOUNTER — Encounter (HOSPITAL_COMMUNITY): Payer: Self-pay | Admitting: *Deleted

## 2023-08-17 NOTE — Telephone Encounter (Signed)
 PA DENIED, BUT WAS APPROVED ON 07/26/2023

## 2023-08-20 ENCOUNTER — Other Ambulatory Visit: Payer: Self-pay | Admitting: Family Medicine

## 2023-08-20 MED ORDER — HYDROCODONE-ACETAMINOPHEN 10-325 MG PO TABS: 1.0000 | ORAL_TABLET | Freq: Four times a day (QID) | ORAL | 0 refills | Status: DC

## 2023-08-30 ENCOUNTER — Encounter: Payer: Self-pay | Admitting: Family Medicine

## 2023-08-30 ENCOUNTER — Other Ambulatory Visit: Payer: Self-pay | Admitting: Family Medicine

## 2023-08-30 MED ORDER — HYDROCODONE-ACETAMINOPHEN 10-325 MG PO TABS: 1.0000 | ORAL_TABLET | Freq: Four times a day (QID) | ORAL | 0 refills | Status: DC

## 2023-09-06 ENCOUNTER — Encounter: Payer: Self-pay | Admitting: Urology

## 2023-09-13 ENCOUNTER — Other Ambulatory Visit: Payer: Self-pay | Admitting: Family Medicine

## 2023-09-13 ENCOUNTER — Encounter: Payer: Self-pay | Admitting: Family Medicine

## 2023-09-13 MED ORDER — HYDROCODONE-ACETAMINOPHEN 10-325 MG PO TABS: 1.0000 | ORAL_TABLET | Freq: Four times a day (QID) | ORAL | 0 refills | Status: DC

## 2023-09-14 ENCOUNTER — Other Ambulatory Visit: Payer: Self-pay | Admitting: Family Medicine

## 2023-09-14 DIAGNOSIS — N200 Calculus of kidney: Secondary | ICD-10-CM

## 2023-09-27 ENCOUNTER — Encounter: Payer: Self-pay | Admitting: Family Medicine

## 2023-09-28 ENCOUNTER — Telehealth: Payer: Self-pay

## 2023-09-28 NOTE — Telephone Encounter (Signed)
 Patient is having flank pain. Possible kidney stones.  Asking if he can be seen sooner than 10-18-23 and have an imaging order prior to sooner appointment.  Please advise.  Call:  [phone]

## 2023-09-28 NOTE — Telephone Encounter (Signed)
 Pt stated he is having flank and groin pain - pain 6-/10 constantly, has been sever the past couple weeks

## 2023-10-01 ENCOUNTER — Encounter: Payer: Self-pay | Admitting: Family Medicine

## 2023-10-01 ENCOUNTER — Other Ambulatory Visit: Payer: Self-pay | Admitting: Family Medicine

## 2023-10-01 MED ORDER — HYDROCODONE-ACETAMINOPHEN 10-325 MG PO TABS: 1.0000 | ORAL_TABLET | Freq: Four times a day (QID) | ORAL | 0 refills | Status: DC

## 2023-10-03 ENCOUNTER — Encounter: Payer: Self-pay | Admitting: Family Medicine

## 2023-10-03 ENCOUNTER — Ambulatory Visit: Admitting: Family Medicine

## 2023-10-03 VITALS — BP 138/74 | HR 79 | Temp 97.9°F | Ht 72.0 in | Wt 289.8 lb

## 2023-10-03 DIAGNOSIS — G4700 Insomnia, unspecified: Secondary | ICD-10-CM

## 2023-10-03 DIAGNOSIS — G8929 Other chronic pain: Secondary | ICD-10-CM

## 2023-10-03 DIAGNOSIS — M549 Dorsalgia, unspecified: Secondary | ICD-10-CM | POA: Diagnosis not present

## 2023-10-03 MED ORDER — TRAZODONE HCL 50 MG PO TABS: 25.0000 mg | ORAL_TABLET | Freq: Every evening | ORAL | 2 refills | Status: DC | PRN

## 2023-10-03 NOTE — Progress Notes (Unsigned)
 He has urology f/u pending for 10/18/23.    He has referral pending about his back.    Per 08/10/23 notes:  The patient unfortunately did not experience any pain relief following his recent L4-5 ESI. He is very frustrated that no past injections or surgery has helped him. His SCS did not help.   I reviewed his 06/27/23 lumbar spine CT result with him again today. (As noted, no imaging center would schedule the MRI I had ordered despite the fact that his retained SCS leads are MRI compatible). He has disc degeneration and bulges with mild stenosis at L3-4 greater than L2-3. The issues at L4-5 and L5-S1 are more prominent. At L4-5 there is evidence of his left L4 laminectomy, a calcified disc bulge resulting in mild central and foraminal stenosis with some compression of the L5 nerve roots. At L5-S1 there is another disc bulge with moderate bilateral foraminal stenosis, left greater than right. His pain radiates to the left buttock. I talked with him about a left L5-S1 TFLESI to address his low back and left lower extremity pain. He would like to try. Pain score is 7/10 despite his opioid pain medication. His quality of life is suffering.   As noted, recent physical therapy aggravated his low back.  =================== He is awaiting surgery clinic input.  He isn't sleeping well with ambien .  Options d/w pt.    Still having L buttock pain at baseline.  Now with R testicle and lower back pain.  H/o stones noted.   He had better pain relief with morphine  but availability was limited.    Using [facility].  I can call pharmacy on Wednesday.    He needs a letter for disability.  Done for patient at OV.    Rrr Ctab No BLE edema.  Hypersensitive skin on the back.  Paresthesia on the L thigh.

## 2023-10-03 NOTE — Patient Instructions (Addendum)
 Please update your address.   I'll check with the pharmacy.  Take care.  Glad to see you. Cut lexapro  in half for 7 days.  Then take every other day for 7 days.  Then start trazodone  at night.  Cut ambien  back to 5mg  at night in the meantime.  Stop ambien  when starting trazodone .

## 2023-10-04 ENCOUNTER — Encounter: Payer: Self-pay | Admitting: Family Medicine

## 2023-10-04 NOTE — Telephone Encounter (Signed)
 See OV and following notes.

## 2023-10-04 NOTE — Assessment & Plan Note (Signed)
 With pain contributing to sleep disruption.  Ambien  is not helping much with insomnia.  Discussed options. Cut lexapro  in half for 7 days.  Then take every other day for 7 days.  Then start trazodone  at night.  Cut ambien  back to 5mg  at night in the meantime.  Stop ambien  when starting trazodone .   Hopefully trazodone  will effectively replace Lexapro  and Ambien .  Discussed.

## 2023-10-05 MED ORDER — MORPHINE SULFATE ER 15 MG PO TBCR: EXTENDED_RELEASE_TABLET | ORAL | 0 refills | Status: DC

## 2023-10-05 MED ORDER — MORPHINE SULFATE 15 MG PO TABS: 7.5000 mg | ORAL_TABLET | Freq: Two times a day (BID) | ORAL | 0 refills | Status: DC | PRN

## 2023-10-05 NOTE — Telephone Encounter (Signed)
 Please check on PA for morphine  rxs that I recent sent. Thanks.

## 2023-10-05 NOTE — Telephone Encounter (Signed)
 Please check on prior authorization for IR and ER morphine  please. Thank you

## 2023-10-05 NOTE — Assessment & Plan Note (Signed)
 He is awaiting surgery clinic input.  He had previous epidural steroid injection.  Previous spinal cord stimulator noted.  Previous laminectomy noted.  I will await surgery clinic input.  I asked him to check about medication availability.  See MyChart message.  See orders.  Would stop hydrocodone  and changed back to morphine  depending on availability.  Not sedated.  Okay for outpatient follow-up.

## 2023-10-06 ENCOUNTER — Telehealth: Payer: Self-pay

## 2023-10-06 ENCOUNTER — Other Ambulatory Visit: Payer: Self-pay | Admitting: Family Medicine

## 2023-10-06 ENCOUNTER — Other Ambulatory Visit (HOSPITAL_COMMUNITY): Payer: Self-pay

## 2023-10-06 MED ORDER — MORPHINE SULFATE ER 15 MG PO TBCR: EXTENDED_RELEASE_TABLET | ORAL | 0 refills | Status: DC

## 2023-10-06 MED ORDER — MORPHINE SULFATE 15 MG PO TABS: 7.5000 mg | ORAL_TABLET | Freq: Two times a day (BID) | ORAL | 0 refills | Status: DC | PRN

## 2023-10-06 NOTE — Telephone Encounter (Signed)
 Copied from CRM [id number]. Topic: Clinical - Prescription Issue >> Oct 06, 2023  9:39 AM Arm[place] J wrote: Reason for CRM: Patient wanted to let Dr [name] know that [facility] has supply for morphine  (MSIR) 15 MG tablet & morphine  (MS CONTIN ) 15 MG 12 hr tablet. They told the patient that they could get this in for him by today but a prior authorization is needed.  The patient is wanting this completed by today.  [facility] [address] [place], [place] - [address] [facility] [address] [place] [address] Phone: [phone] Fax: [phone] Hours: Not open 24 hours  Please reach out to patient once there is any sort of update on either MyChart or via phone call.

## 2023-10-06 NOTE — Telephone Encounter (Signed)
 Please start prior authorization for IR and ER morphine  please at [facility]. Thank you

## 2023-10-06 NOTE — Telephone Encounter (Signed)
 I resent the rxs.  Please start PA if needed.  Thanks.

## 2023-10-06 NOTE — Telephone Encounter (Signed)
 Please send prescriptions back to [facility]. He is locked in to that location due to the prescription being an controlled substance.

## 2023-10-09 ENCOUNTER — Telehealth: Payer: Self-pay

## 2023-10-09 ENCOUNTER — Other Ambulatory Visit (HOSPITAL_COMMUNITY): Payer: Self-pay

## 2023-10-09 NOTE — Telephone Encounter (Signed)
 Prior Authorization form/request asks a question that requires your assistance. Please see the question below and advise accordingly. The PA will not be submitted until the necessary information is received.

## 2023-10-09 NOTE — Telephone Encounter (Signed)
 Pharmacy Patient Advocate Encounter   Received notification from Pt Calls Messages that prior authorization for Morpine 15 mg tabs (MSIR) is required/requested.   Insurance verification completed.   The patient is insured through [employer] .   Per test claim:

## 2023-10-10 ENCOUNTER — Telehealth: Payer: Self-pay

## 2023-10-10 DIAGNOSIS — N200 Calculus of kidney: Secondary | ICD-10-CM

## 2023-10-10 NOTE — Telephone Encounter (Signed)
 FYI from pharmacy team.

## 2023-10-10 NOTE — Telephone Encounter (Signed)
Yes, reviewed.  Thanks.

## 2023-10-11 ENCOUNTER — Other Ambulatory Visit (HOSPITAL_COMMUNITY): Payer: Self-pay

## 2023-10-11 NOTE — Telephone Encounter (Signed)
 Pharmacy Patient Advocate Encounter  Received notification from [employer] that Prior Authorization for Morphine  15 mg ER 12 hr tabs #90 has been APPROVED from 10/11/23 to 01/09/24. Called [facility] [place] to process, however pt already picked up 10/06/23 and paid out of pocket.  PA #/Case ID/Reference #: [id number]

## 2023-10-11 NOTE — Telephone Encounter (Signed)
 Casey Caldwell    10/11/23 12:04 PM Note Pharmacy Patient Advocate Encounter   Received notification from [employer] that Prior Authorization for Morphine  15 mg ER 12 hr tabs #90 has been APPROVED from 10/11/23 to 01/09/24. Called [facility] [place] to process, however pt already picked up 10/06/23 and paid out of pocket.  PA #/Case ID/Reference #: [id number]    ================ Noted. Thanks.

## 2023-10-11 NOTE — Telephone Encounter (Signed)
 Pharmacy Patient Advocate Encounter   Received notification from Pt Calls Messages that prior authorization for Morphine  15mg  12 hr tabs (MS Contin ) #90 is required/requested.   Insurance verification completed.   The patient is insured through [employer] .   Per test claim: PA required; PA submitted to above mentioned insurance via Latent Key/confirmation #/EOC [id number] Status is pending

## 2023-10-18 ENCOUNTER — Ambulatory Visit: Admitting: Urology

## 2023-10-31 ENCOUNTER — Encounter: Payer: Self-pay | Admitting: Family Medicine

## 2023-11-01 ENCOUNTER — Other Ambulatory Visit: Payer: Self-pay | Admitting: Family Medicine

## 2023-11-01 MED ORDER — MORPHINE SULFATE 15 MG PO TABS: 7.5000 mg | ORAL_TABLET | Freq: Two times a day (BID) | ORAL | 0 refills | Status: DC | PRN

## 2023-11-01 MED ORDER — TRAZODONE HCL 50 MG PO TABS: 75.0000 mg | ORAL_TABLET | Freq: Every evening | ORAL | 2 refills | Status: DC | PRN

## 2023-11-01 MED ORDER — MORPHINE SULFATE ER 15 MG PO TBCR: EXTENDED_RELEASE_TABLET | ORAL | 0 refills | Status: DC

## 2023-11-07 DIAGNOSIS — T85192A Other mechanical complication of implanted electronic neurostimulator (electrode) of spinal cord, initial encounter: Secondary | ICD-10-CM | POA: Diagnosis not present

## 2023-11-08 ENCOUNTER — Other Ambulatory Visit: Payer: Self-pay | Admitting: Family Medicine

## 2023-11-08 MED ORDER — ZOLPIDEM TARTRATE 5 MG PO TABS
5.0000 mg | ORAL_TABLET | Freq: Every evening | ORAL | 0 refills | Status: DC | PRN
Start: 2023-11-08 — End: 2023-12-08

## 2023-11-22 DIAGNOSIS — Z4542 Encounter for adjustment and management of neuropacemaker (brain) (peripheral nerve) (spinal cord): Secondary | ICD-10-CM | POA: Diagnosis not present

## 2023-11-22 DIAGNOSIS — E669 Obesity, unspecified: Secondary | ICD-10-CM | POA: Diagnosis not present

## 2023-11-22 DIAGNOSIS — K219 Gastro-esophageal reflux disease without esophagitis: Secondary | ICD-10-CM | POA: Diagnosis not present

## 2023-11-22 DIAGNOSIS — T85890A Other specified complication of nervous system prosthetic devices, implants and grafts, initial encounter: Secondary | ICD-10-CM | POA: Diagnosis not present

## 2023-11-22 DIAGNOSIS — T85192A Other mechanical complication of implanted electronic neurostimulator (electrode) of spinal cord, initial encounter: Secondary | ICD-10-CM | POA: Diagnosis not present

## 2023-11-24 ENCOUNTER — Other Ambulatory Visit: Payer: Self-pay

## 2023-11-24 MED ORDER — MORPHINE SULFATE ER 15 MG PO TBCR: EXTENDED_RELEASE_TABLET | ORAL | 0 refills | Status: DC

## 2023-11-24 MED ORDER — MORPHINE SULFATE 15 MG PO TABS: 7.5000 mg | ORAL_TABLET | Freq: Two times a day (BID) | ORAL | 0 refills | Status: DC | PRN

## 2023-11-24 NOTE — Telephone Encounter (Signed)
 Medications sent to Dr. [name] for approval.

## 2023-11-24 NOTE — Progress Notes (Unsigned)
 He had rx filled on 11/04/23.  Rx sent to fill 12/02/23.    Thanks.

## 2023-11-28 ENCOUNTER — Telehealth: Payer: Self-pay | Admitting: Family Medicine

## 2023-11-28 NOTE — Telephone Encounter (Signed)
 Copied from CRM [id number]. Topic: Clinical - Prescription Issue >> Nov 28, 2023  3:03 PM Casey Caldwell ORN wrote: Reason for CRM: Pt had surgery last week and is in need of his pain medication ( morphine  ) prior to refill date of 11/8 because he will be out of it. Mychart message was sent  Please advise [phone]

## 2023-11-29 MED ORDER — MORPHINE SULFATE ER 15 MG PO TBCR: EXTENDED_RELEASE_TABLET | ORAL | 0 refills | Status: DC

## 2023-11-29 MED ORDER — MORPHINE SULFATE 15 MG PO TABS
7.5000 mg | ORAL_TABLET | Freq: Two times a day (BID) | ORAL | 0 refills | Status: DC | PRN
Start: 2023-11-29 — End: 2023-12-01

## 2023-11-29 MED ORDER — MORPHINE SULFATE 15 MG PO TABS: 7.5000 mg | ORAL_TABLET | Freq: Two times a day (BID) | ORAL | 0 refills | Status: DC | PRN

## 2023-11-29 NOTE — Telephone Encounter (Signed)
 Called pharmacy with no change to the script can't be filled until 11/9.

## 2023-11-29 NOTE — Addendum Note (Signed)
 Addended by: [name] [name] on: 11/29/2023 05:39 PM   Modules accepted: Orders

## 2023-11-29 NOTE — Telephone Encounter (Signed)
 I resent MSIR with a change for short term use.  Please see if they will fill that.  Thanks.

## 2023-11-29 NOTE — Addendum Note (Signed)
 Addended by: [name] [name] on: 11/29/2023 04:29 PM   Modules accepted: Orders

## 2023-11-29 NOTE — Telephone Encounter (Signed)
 Copied from CRM [id number]. Topic: Clinical - Prescription Issue >> Nov 28, 2023  3:03 PM Alfonso ORN wrote: Reason for CRM: Pt had surgery last week and is in need of his pain medication ( morphine  ) prior to refill date of 11/8 because he will be out of it. Mychart message was sent  Please advise [phone] (M >> Nov 29, 2023  9:10 AM Robinson H wrote: Patient following up on message sent yesterday regarding medicaiton early refill, advised patient office has until next business day to reach back out, please call patient, thanks.  Burak [phone]

## 2023-11-29 NOTE — Telephone Encounter (Signed)
 Please call pharmacy and see if they will fill now given the recent surgery.  I resent rx.  Thanks.

## 2023-11-30 ENCOUNTER — Telehealth: Payer: Self-pay

## 2023-11-30 NOTE — Telephone Encounter (Signed)
 Added to open message on this for patient.  No further action needed at this time.

## 2023-11-30 NOTE — Telephone Encounter (Signed)
 Copied from CRM [id number]. Topic: Clinical - Prescription Issue >> Nov 29, 2023  5:59 PM Dedra B wrote: Reason for CRM: Pharmacist from [facility] needs diagnosis code for the morphine .

## 2023-11-30 NOTE — Telephone Encounter (Signed)
 Z96.89  G89.29  M54.9

## 2023-11-30 NOTE — Telephone Encounter (Signed)
 Called pharmacy gave codes that are provided below. Patient was able to pick up this morning. In order to fill all gaps he will need one more script of the fast acting Morphine  15 mg 5 day and then when that is up he will need a regular scrip for 30 day to get him back on schedule

## 2023-12-01 ENCOUNTER — Other Ambulatory Visit (HOSPITAL_COMMUNITY): Payer: Self-pay

## 2023-12-01 ENCOUNTER — Telehealth: Payer: Self-pay

## 2023-12-01 MED ORDER — MORPHINE SULFATE 15 MG PO TABS: 7.5000 mg | ORAL_TABLET | Freq: Two times a day (BID) | ORAL | 0 refills | Status: DC | PRN

## 2023-12-01 NOTE — Telephone Encounter (Signed)
 Pharmacy Patient Advocate Encounter   Received notification from Onbase that prior authorization for Morphine  sulfate 15 mg tabs (20) is required/requested.   Insurance verification completed.   The patient is insured through [employer].   Per test claim: PA required; PA submitted to above mentioned insurance via Latent Key/confirmation #/EOC [id number] Status is pending

## 2023-12-01 NOTE — Addendum Note (Signed)
 Addended by: [name] [name] on: 12/01/2023 07:05 AM   Modules accepted: Orders

## 2023-12-01 NOTE — Telephone Encounter (Signed)
   Pharmacy Patient Advocate Encounter  Received notification from [employer] that Prior Authorization for Morphine  Sulfate 15 mg tabs (20) has been APPROVED from 12/01/23 to 05/29/24   PA #/Case ID/Reference #: # [id number]   Copay shows as $20.89, however this may change as patient is locked into [employer]

## 2023-12-01 NOTE — Telephone Encounter (Addendum)
 I sent the next MSIR refill in the meantime.  Thanks.

## 2023-12-03 ENCOUNTER — Telehealth: Payer: Self-pay | Admitting: Family Medicine

## 2023-12-03 NOTE — Telephone Encounter (Signed)
 Please check with pharmacy to see when they will be able to refill his next prescriptions for immediate release and long-acting morphine .  Please let me know.  Thanks.

## 2023-12-06 NOTE — Telephone Encounter (Signed)
 Noted. Thanks.

## 2023-12-06 NOTE — Telephone Encounter (Signed)
 Spoke with pharmacy.    MS Contin  just picked up a 30 day supply on Monday 12/04/23 is not due for refill until 01/02/24  MSIR is due for refill Friday 12/08/23.  The refill that was sent in and will be filled Friday is for 20 tabs a 10 day supply.

## 2023-12-08 ENCOUNTER — Telehealth: Admitting: Family Medicine

## 2023-12-08 ENCOUNTER — Encounter: Payer: Self-pay | Admitting: Family Medicine

## 2023-12-08 VITALS — Ht 72.0 in | Wt 289.8 lb

## 2023-12-08 DIAGNOSIS — N200 Calculus of kidney: Secondary | ICD-10-CM

## 2023-12-08 DIAGNOSIS — G4700 Insomnia, unspecified: Secondary | ICD-10-CM | POA: Diagnosis not present

## 2023-12-08 DIAGNOSIS — G894 Chronic pain syndrome: Secondary | ICD-10-CM | POA: Diagnosis not present

## 2023-12-08 MED ORDER — BUSPIRONE HCL 5 MG PO TABS: 5.0000 mg | ORAL_TABLET | Freq: Two times a day (BID) | ORAL | 1 refills | Status: AC

## 2023-12-08 MED ORDER — ALLOPURINOL 300 MG PO TABS: 300.0000 mg | ORAL_TABLET | Freq: Every day | ORAL | 1 refills | Status: AC

## 2023-12-08 MED ORDER — ZOLPIDEM TARTRATE 5 MG PO TABS: 5.0000 mg | ORAL_TABLET | Freq: Every evening | ORAL | 2 refills | Status: AC | PRN

## 2023-12-08 MED ORDER — ARIPIPRAZOLE 5 MG PO TABS: 2.5000 mg | ORAL_TABLET | Freq: Every day | ORAL | 1 refills | Status: AC

## 2023-12-08 MED ORDER — TAMSULOSIN HCL 0.4 MG PO CAPS: 0.4000 mg | ORAL_CAPSULE | Freq: Every day | ORAL | 3 refills | Status: AC | PRN

## 2023-12-08 NOTE — Progress Notes (Signed)
 Virtual visit completed through caregility or similar program Patient location: home  Provider location:  at [facility], office  Participants: Patient and me (unless stated otherwise below)  Limitations and rationale for visit method d/w patient.  Patient agreed to proceed.  Patient identified by 2 identifiers. If vitals are not listed, then patient was unable to self-report due to a lack of equipment at home via telehealth  CC: chronic pain.   HPI: He is going to get [facility] tomorrow per pharmacy fill date.  D/w pt. he had increased pain after his surgery.  He is on restrictions for lifting after his procedure.  He has f/u pending for next week.  He should get scheduled for MRI soon, now that he leads are out.  Still with pain in the L groin but his general level of pain is similar to prior accounting for pain from the procedure.    His housing situation is stable for now.  D/w pt.    Sleep d/w pt.  Back on ambien  and that helped.  No ADE, no parasomnias.    Meds and allergies reviewed.   ROS: Per HPI unless specifically indicated in ROS section   NAD Speech wnl  A/P: Chronic pain.  Taking long-acting morphine  with as needed short acting morphine . He can update me next week about timeline on MSIR to align rxs going forward.  He is going to see about getting his MSIR filled tomorrow.  Now that the leads are out he can get his MRI scheduled through the outside clinic.  Not sedated.  Okay for outpatient follow-up.  He agrees with plan.  Insomnia.  Continue Ambien .  Failed treatment with other medications.  Symptoms improved with Ambien .

## 2023-12-10 NOTE — Assessment & Plan Note (Signed)
 Continue Ambien .  Failed treatment with other medications.  Symptoms improved with Ambien .

## 2023-12-10 NOTE — Assessment & Plan Note (Signed)
 Chronic pain.  Taking long-acting morphine  with as needed short acting morphine . He can update me next week about timeline on MSIR to align rxs going forward.  He is going to see about getting his MSIR filled tomorrow.  Now that the leads are out he can get his MRI scheduled through the outside clinic.  Not sedated.  Okay for outpatient follow-up.  He agrees with plan.

## 2023-12-11 ENCOUNTER — Other Ambulatory Visit (HOSPITAL_COMMUNITY): Payer: Self-pay

## 2023-12-11 ENCOUNTER — Telehealth: Payer: Self-pay

## 2023-12-11 NOTE — Telephone Encounter (Signed)
 Pharmacy Patient Advocate Encounter   Received notification from Onbase that prior authorization for Zolpidem  5 is required/requested.   Insurance verification completed.   The patient is insured through [employer].   Per test claim: PA not required. Spoke with [facility] and they are getting medication ready now for pickup later today. Medication has DUR interacting with other medications patient is taking

## 2023-12-12 NOTE — Telephone Encounter (Signed)
 Spoke to Patient regarding the Zolpidem  message.

## 2023-12-13 DIAGNOSIS — M545 Low back pain, unspecified: Secondary | ICD-10-CM | POA: Diagnosis not present

## 2023-12-13 DIAGNOSIS — M5442 Lumbago with sciatica, left side: Secondary | ICD-10-CM | POA: Diagnosis not present

## 2023-12-15 ENCOUNTER — Encounter: Payer: Self-pay | Admitting: Family Medicine

## 2023-12-17 ENCOUNTER — Other Ambulatory Visit: Payer: Self-pay | Admitting: Family Medicine

## 2023-12-17 MED ORDER — MORPHINE SULFATE 15 MG PO TABS: 7.5000 mg | ORAL_TABLET | Freq: Two times a day (BID) | ORAL | 0 refills | Status: DC | PRN

## 2023-12-25 ENCOUNTER — Encounter: Payer: Self-pay | Admitting: Family Medicine

## 2023-12-25 ENCOUNTER — Telehealth: Admitting: Family Medicine

## 2023-12-25 VITALS — Ht 72.0 in | Wt 282.0 lb

## 2023-12-25 DIAGNOSIS — M549 Dorsalgia, unspecified: Secondary | ICD-10-CM

## 2023-12-25 DIAGNOSIS — G8929 Other chronic pain: Secondary | ICD-10-CM | POA: Diagnosis not present

## 2023-12-25 MED ORDER — MORPHINE SULFATE 15 MG PO TABS: 15.0000 mg | ORAL_TABLET | Freq: Four times a day (QID) | ORAL | 0 refills | Status: DC | PRN

## 2023-12-25 MED ORDER — MORPHINE SULFATE ER 15 MG PO TBCR: EXTENDED_RELEASE_TABLET | ORAL | 0 refills | Status: DC

## 2023-12-25 NOTE — Progress Notes (Unsigned)
 Virtual visit completed through caregility or similar program Patient location: home  Provider location: [place] at [facility], office  Participants: Patient and me (unless stated otherwise below)  Limitations and rationale for visit method d/w patient.  Patient agreed to proceed.  Patient identified by 2 identifiers. If vitals are not listed, then patient was unable to self-report due to a lack of equipment at home via telehealth  CC: follow up for back pain.   HPI:  D/w pt about incision pain vs fluid collection vs prev baseline pain.  MRI is getting scheduled.  No fevers.    Still in pain, not sedated.    He had needed 60mg  MSER morphine  with 45-60mg  MSIR per day.   He has no relief from stimulator now, with subsequent inc in pain.     Meds and allergies reviewed.   ROS: Per HPI unless specifically indicated in ROS section   NAD Speech wnl  A/P:

## 2023-12-27 ENCOUNTER — Encounter: Payer: Self-pay | Admitting: Family Medicine

## 2023-12-27 NOTE — Telephone Encounter (Signed)
 Copied from CRM #[id number]. Topic: Clinical - Prescription Issue >> Dec 27, 2023 11:06 AM Jasmin G wrote: Reason for CRM: Pt was told by Dr. [name] that if pharmacy had issues refilling his prescription for morphine  (MS CONTIN ) 15 MG 12 hr tablet and morphine  (MSIR) 15 MG tablet he should contact the clinic as he needs his med. Pt was told by pharmacy that his prescription wouldn't be ready until the weekend and he states that he's out of med. Call pt back at [phone] if needed.

## 2023-12-27 NOTE — Assessment & Plan Note (Signed)
  He had the stimulator removed so now he will be able to have his MRI to see if he has a correctable issue.  MRI should be scheduled in the near future, per outside clinic.  He is having more pain in the meantime.  He is not having fevers.  He is not sedated.  At this point still okay for outpatient follow-up. He had needed 60mg  MSER morphine  with 45-60mg  MSIR per day given the postop pain, his baseline pain, and the lack of benefit from spinal stimulator.  Prescription sent for that.  He can update me about how he is feeling over the next few days.  I will await his MRI.  Routine cautions given to patient.

## 2024-01-04 DIAGNOSIS — M48061 Spinal stenosis, lumbar region without neurogenic claudication: Secondary | ICD-10-CM | POA: Diagnosis not present

## 2024-01-04 DIAGNOSIS — Z1389 Encounter for screening for other disorder: Secondary | ICD-10-CM | POA: Diagnosis not present

## 2024-01-04 DIAGNOSIS — M4316 Spondylolisthesis, lumbar region: Secondary | ICD-10-CM | POA: Diagnosis not present

## 2024-01-04 DIAGNOSIS — M5126 Other intervertebral disc displacement, lumbar region: Secondary | ICD-10-CM | POA: Diagnosis not present

## 2024-01-15 ENCOUNTER — Ambulatory Visit: Admitting: Family Medicine

## 2024-01-16 ENCOUNTER — Other Ambulatory Visit: Payer: Self-pay | Admitting: Family Medicine

## 2024-01-16 MED ORDER — MORPHINE SULFATE 15 MG PO TABS: 15.0000 mg | ORAL_TABLET | Freq: Four times a day (QID) | ORAL | 0 refills | Status: DC | PRN

## 2024-01-16 MED ORDER — TRIAMCINOLONE ACETONIDE 0.5 % EX CREA: 1.0000 | TOPICAL_CREAM | Freq: Two times a day (BID) | CUTANEOUS | 2 refills | Status: AC | PRN

## 2024-01-16 MED ORDER — ONDANSETRON 4 MG PO TBDP: 4.0000 mg | ORAL_TABLET | Freq: Three times a day (TID) | ORAL | 1 refills | Status: AC | PRN

## 2024-01-16 MED ORDER — MORPHINE SULFATE ER 15 MG PO TBCR: EXTENDED_RELEASE_TABLET | ORAL | 0 refills | Status: DC

## 2024-01-23 ENCOUNTER — Other Ambulatory Visit (HOSPITAL_COMMUNITY): Payer: Self-pay

## 2024-01-23 ENCOUNTER — Telehealth: Payer: Self-pay

## 2024-01-23 NOTE — Telephone Encounter (Signed)
 Copied from CRM #[id number]. Topic: Clinical - Medication Prior Auth >> Jan 23, 2024  2:22 PM Chasity T wrote: Reason for CRM: Patient is calling to request prior auth for medications  morphine  (MS CONTIN ) 15 MG 12 hr tablet and morphine  (MSIR) 15 MG tablet. States that pharmacy is waiting for them to fill medication.

## 2024-01-24 ENCOUNTER — Telehealth: Payer: Self-pay

## 2024-01-24 ENCOUNTER — Other Ambulatory Visit (HOSPITAL_COMMUNITY): Payer: Self-pay

## 2024-01-24 NOTE — Telephone Encounter (Signed)
 Pharmacy Patient Advocate Encounter   Received notification from Physician's Office that prior authorization for Morphine  Sulfate 15MG  tablets is required/requested.   Insurance verification completed.   The patient is insured through [employer].   Per test claim: PA required; PA started via CoverMyMeds. KEY [id number] . Waiting for clinical questions to populate.

## 2024-01-24 NOTE — Telephone Encounter (Signed)
 Pharmacy Patient Advocate Encounter   Received notification from Physician's Office that prior authorization for Morphine  Sulfate ER 15MG  er tabletsis required/requested.   Insurance verification completed.   The patient is insured through [employer].   Per test claim: PA required; PA started via CoverMyMeds. KEY [id number] . Waiting for clinical questions to populate.

## 2024-01-26 ENCOUNTER — Other Ambulatory Visit: Payer: Self-pay | Admitting: Family Medicine

## 2024-01-26 ENCOUNTER — Other Ambulatory Visit (HOSPITAL_COMMUNITY): Payer: Self-pay

## 2024-01-26 NOTE — Telephone Encounter (Signed)
 Pharmacy Patient Advocate Encounter  Received notification from [employer] that Prior Authorization for Morphine  Sulfate ER 15MG  er tablets  has been APPROVED from 01/26/2024 to 04/25/2024   PA #/Case ID/Reference #: [id number]

## 2024-01-26 NOTE — Telephone Encounter (Signed)
 Pharmacy Patient Advocate Encounter  Received notification from [employer] that Prior Authorization for Morphine  Sulfate 15MG  tablets  has been APPROVED from 01/24/2024 to 07/22/2024   PA #/Case ID/Reference #: [id number]

## 2024-01-28 NOTE — Telephone Encounter (Signed)
 This has been addressed in other notes.  Thanks.

## 2024-02-20 ENCOUNTER — Other Ambulatory Visit (HOSPITAL_COMMUNITY): Payer: Self-pay

## 2024-02-20 NOTE — Telephone Encounter (Signed)
 Any updates on the PA for morphine  that was initiated on 01/24/24

## 2024-02-21 ENCOUNTER — Telehealth: Payer: Self-pay | Admitting: Family Medicine

## 2024-02-21 ENCOUNTER — Other Ambulatory Visit: Payer: Self-pay | Admitting: Family Medicine

## 2024-02-21 MED ORDER — MORPHINE SULFATE 15 MG PO TABS: 15.0000 mg | ORAL_TABLET | Freq: Four times a day (QID) | ORAL | 0 refills | Status: AC | PRN

## 2024-02-21 MED ORDER — MORPHINE SULFATE ER 15 MG PO TBCR: EXTENDED_RELEASE_TABLET | ORAL | 0 refills | Status: AC

## 2024-02-21 NOTE — Telephone Encounter (Signed)
 Please call [facility] and ask for patient to be able to get his prescriptions on Friday.  He may not be able to get them filled on Saturday due to the weather.  Please pull me for the conversation if needed.  Thanks.

## 2024-02-22 NOTE — Telephone Encounter (Signed)
 Spoke with [place] (pharmacist)she said pt has called and was told he can pick up his medication on Friday.
